# Patient Record
Sex: Male | Born: 1940 | Race: White | Hispanic: No | Marital: Married | Smoking: Never smoker
Health system: Southern US, Community
[De-identification: ages and names within clinical notes are randomized; demographics above are authoritative.]

## PROBLEM LIST (undated history)

## (undated) DIAGNOSIS — R42 Dizziness and giddiness: Secondary | ICD-10-CM

## (undated) DIAGNOSIS — G709 Myoneural disorder, unspecified: Secondary | ICD-10-CM

## (undated) DIAGNOSIS — L309 Dermatitis, unspecified: Secondary | ICD-10-CM

## (undated) DIAGNOSIS — N189 Chronic kidney disease, unspecified: Secondary | ICD-10-CM

## (undated) DIAGNOSIS — C189 Malignant neoplasm of colon, unspecified: Secondary | ICD-10-CM

## (undated) DIAGNOSIS — C801 Malignant (primary) neoplasm, unspecified: Secondary | ICD-10-CM

## (undated) DIAGNOSIS — E119 Type 2 diabetes mellitus without complications: Secondary | ICD-10-CM

## (undated) DIAGNOSIS — H269 Unspecified cataract: Secondary | ICD-10-CM

## (undated) DIAGNOSIS — E785 Hyperlipidemia, unspecified: Secondary | ICD-10-CM

## (undated) DIAGNOSIS — I10 Essential (primary) hypertension: Secondary | ICD-10-CM

## (undated) DIAGNOSIS — Z87442 Personal history of urinary calculi: Secondary | ICD-10-CM

## (undated) HISTORY — DX: Unspecified cataract: H26.9

## (undated) HISTORY — DX: Hyperlipidemia, unspecified: E78.5

## (undated) HISTORY — DX: Chronic kidney disease, unspecified: N18.9

## (undated) HISTORY — DX: Type 2 diabetes mellitus without complications: E11.9

## (undated) HISTORY — PX: COLON SURGERY: SHX602

## (undated) HISTORY — PX: CATARACT EXTRACTION: SUR2

## (undated) HISTORY — PX: CYSTOSCOPY: SUR368

## (undated) HISTORY — DX: Malignant neoplasm of colon, unspecified: C18.9

## (undated) HISTORY — DX: Essential (primary) hypertension: I10

## (undated) HISTORY — DX: Myoneural disorder, unspecified: G70.9

---

## 2009-11-19 ENCOUNTER — Encounter: Payer: Self-pay | Admitting: General Practice

## 2009-12-11 ENCOUNTER — Encounter: Payer: Self-pay | Admitting: General Practice

## 2010-12-25 ENCOUNTER — Ambulatory Visit: Payer: Self-pay | Admitting: Orthopedic Surgery

## 2011-01-08 ENCOUNTER — Encounter: Payer: Self-pay | Admitting: Orthopedic Surgery

## 2016-02-04 DIAGNOSIS — D649 Anemia, unspecified: Secondary | ICD-10-CM | POA: Insufficient documentation

## 2016-02-04 DIAGNOSIS — L309 Dermatitis, unspecified: Secondary | ICD-10-CM | POA: Insufficient documentation

## 2016-06-23 ENCOUNTER — Encounter: Payer: Self-pay | Admitting: *Deleted

## 2016-06-23 ENCOUNTER — Encounter: Payer: Medicare Other | Attending: Family Medicine | Admitting: *Deleted

## 2016-06-23 VITALS — BP 140/60 | Ht 67.0 in | Wt 182.2 lb

## 2016-06-23 DIAGNOSIS — Z6828 Body mass index (BMI) 28.0-28.9, adult: Secondary | ICD-10-CM | POA: Insufficient documentation

## 2016-06-23 DIAGNOSIS — Z713 Dietary counseling and surveillance: Secondary | ICD-10-CM | POA: Insufficient documentation

## 2016-06-23 DIAGNOSIS — E119 Type 2 diabetes mellitus without complications: Secondary | ICD-10-CM | POA: Diagnosis not present

## 2016-06-23 NOTE — Progress Notes (Signed)
Diabetes Self-Management Education  Visit Type: First/Initial  Appt. Start Time:  0845 Appt. End Time: 0945  06/23/2016  Mr. Miguel Flores, identified by name and date of birth, is a 75 y.o. male with a diagnosis of Diabetes: Type 2.   ASSESSMENT  Blood pressure 140/60, height '5\' 7"'$  (1.702 m), weight 182 lb 3.2 oz (82.6 kg). Body mass index is 28.54 kg/m.      Diabetes Self-Management Education - 06/23/16 1001      Visit Information   Visit Type First/Initial     Initial Visit   Diabetes Type Type 2   Are you currently following a meal plan? No   Are you taking your medications as prescribed? Yes   Date Diagnosed 6 years ago     Health Coping   How would you rate your overall health? Good     Psychosocial Assessment   Patient Belief/Attitude about Diabetes Motivated to manage diabetes   Self-care barriers None   Self-management support Doctor's office;Family   Patient Concerns Nutrition/Meal planning;Glycemic Control;Medication;Monitoring;Weight Control;Healthy Lifestyle   Special Needs None   Preferred Learning Style Auditory   Learning Readiness Ready   How often do you need to have someone help you when you read instructions, pamphlets, or other written materials from your doctor or pharmacy? 1 - Never   What is the last grade level you completed in school? 12th     Pre-Education Assessment   Patient understands the diabetes disease and treatment process. Needs Instruction   Patient understands incorporating nutritional management into lifestyle. Needs Instruction   Patient undertands incorporating physical activity into lifestyle. Needs Review   Patient understands using medications safely. Needs Instruction   Patient understands monitoring blood glucose, interpreting and using results Needs Review   Patient understands prevention, detection, and treatment of acute complications. Needs Instruction   Patient understands prevention, detection, and treatment of  chronic complications. Needs Review   Patient understands how to develop strategies to address psychosocial issues. Needs Instruction   Patient understands how to develop strategies to promote health/change behavior. Needs Instruction     Complications   Last HgB A1C per patient/outside source 7.9 %  05/30/16   How often do you check your blood sugar? 1-2 times/day   Fasting Blood glucose range (mg/dL) 130-179  Pt reports FBG's usually 130-150's mg/dL.   Have you had a dilated eye exam in the past 12 months? No   Have you had a dental exam in the past 12 months? Yes   Are you checking your feet? Yes   How many days per week are you checking your feet? 4     Dietary Intake   Breakfast usually eats out - sausage biscuit or pork chop biscuit; occasional cereal and milk   Lunch sandwich or occasional soup or peanut butter crackers and fruit   Dinner grilled chicken salad or fish or occasional hamburger and steak   Snack (evening) fruit   Beverage(s) water, coffee with Splenda, diet Coke     Exercise   Exercise Type Moderate (swimming / aerobic walking)   How many days per week to you exercise? 2   How many minutes per day do you exercise? 60   Total minutes per week of exercise 120     Patient Education   Previous Diabetes Education No   Disease state  Definition of diabetes, type 1 and 2, and the diagnosis of diabetes   Nutrition management  Role of diet in the treatment of diabetes  and the relationship between the three main macronutrients and blood glucose level   Physical activity and exercise  Role of exercise on diabetes management, blood pressure control and cardiac health.   Medications Reviewed patients medication for diabetes, action, purpose, timing of dose and side effects.   Monitoring Purpose and frequency of SMBG.;Identified appropriate SMBG and/or A1C goals.   Chronic complications Relationship between chronic complications and blood glucose control   Psychosocial  adjustment Identified and addressed patients feelings and concerns about diabetes     Individualized Goals (developed by patient)   Reducing Risk Improve blood sugars Decrease medications Prevent diabetes complications Lose weight Lead a healthier lifestyle     Outcomes   Expected Outcomes Demonstrated interest in learning. Expect positive outcomes      Individualized Plan for Diabetes Self-Management Training:   Learning Objective:  Patient will have a greater understanding of diabetes self-management. Patient education plan is to attend individual and/or group sessions per assessed needs and concerns.   Plan:   Patient Instructions  Check blood sugars 2 x day before breakfast every day and 2 hrs after supper 3 x week Exercise:  Continue softball  for  60-90 minutes   2 days a week Eat 3 meals day,  1-2  snacks a day Space meals 4-6 hours apart Limit foods high in fat Make an eye doctor appointment Bring blood sugar records to the next class   Expected Outcomes:  Demonstrated interest in learning. Expect positive outcomes  Education material provided:  General Meal Planning Guidelines Simple Meal Plan  If problems or questions, patient to contact team via:  Johny Drilling, [place], [place], CDE [phone]  Future DSME appointment:  Thursday July 03, 2016 for Class 1

## 2016-06-23 NOTE — Patient Instructions (Signed)
Check blood sugars 2 x day before breakfast every day and 2 hrs after supper 3 x week  Exercise:  Continue softball  for  60-90 minutes   2 days a week  Eat 3 meals day,  1-2  snacks a day Space meals 4-6 hours apart Limit foods high in fat  Make an eye doctor appointment  Bring blood sugar records to the next class  Return for classes on:

## 2016-07-03 ENCOUNTER — Encounter: Payer: Self-pay | Admitting: Dietician

## 2016-07-03 ENCOUNTER — Encounter: Payer: Medicare Other | Admitting: Dietician

## 2016-07-03 VITALS — Ht 67.0 in | Wt 182.4 lb

## 2016-07-03 DIAGNOSIS — E119 Type 2 diabetes mellitus without complications: Secondary | ICD-10-CM

## 2016-07-03 DIAGNOSIS — Z713 Dietary counseling and surveillance: Secondary | ICD-10-CM | POA: Diagnosis not present

## 2016-07-03 NOTE — Progress Notes (Signed)

## 2016-07-17 ENCOUNTER — Encounter: Payer: Medicare Other | Attending: Family Medicine | Admitting: Dietician

## 2016-07-17 ENCOUNTER — Encounter: Payer: Self-pay | Admitting: Dietician

## 2016-07-17 VITALS — BP 134/64 | Ht 67.0 in | Wt 184.4 lb

## 2016-07-17 DIAGNOSIS — E119 Type 2 diabetes mellitus without complications: Secondary | ICD-10-CM | POA: Diagnosis not present

## 2016-07-17 DIAGNOSIS — Z713 Dietary counseling and surveillance: Secondary | ICD-10-CM | POA: Diagnosis not present

## 2016-07-17 DIAGNOSIS — Z6828 Body mass index (BMI) 28.0-28.9, adult: Secondary | ICD-10-CM | POA: Insufficient documentation

## 2016-07-17 NOTE — Progress Notes (Signed)

## 2016-07-31 ENCOUNTER — Ambulatory Visit: Payer: Medicare Other

## 2016-08-21 ENCOUNTER — Encounter: Payer: Self-pay | Admitting: Dietician

## 2016-08-21 ENCOUNTER — Encounter: Payer: Medicare Other | Attending: Family Medicine | Admitting: Dietician

## 2016-08-21 VITALS — Wt 183.2 lb

## 2016-08-21 DIAGNOSIS — E119 Type 2 diabetes mellitus without complications: Secondary | ICD-10-CM | POA: Diagnosis not present

## 2016-08-21 DIAGNOSIS — Z6828 Body mass index (BMI) 28.0-28.9, adult: Secondary | ICD-10-CM | POA: Insufficient documentation

## 2016-08-21 DIAGNOSIS — Z713 Dietary counseling and surveillance: Secondary | ICD-10-CM | POA: Insufficient documentation

## 2016-09-02 ENCOUNTER — Encounter: Payer: Self-pay | Admitting: *Deleted

## 2016-10-07 DIAGNOSIS — D692 Other nonthrombocytopenic purpura: Secondary | ICD-10-CM | POA: Insufficient documentation

## 2016-11-10 DIAGNOSIS — C189 Malignant neoplasm of colon, unspecified: Secondary | ICD-10-CM

## 2016-11-10 HISTORY — DX: Malignant neoplasm of colon, unspecified: C18.9

## 2017-02-05 ENCOUNTER — Inpatient Hospital Stay: Payer: Medicare Other

## 2017-02-05 ENCOUNTER — Emergency Department: Payer: Medicare Other

## 2017-02-05 ENCOUNTER — Encounter: Payer: Self-pay | Admitting: Emergency Medicine

## 2017-02-05 ENCOUNTER — Inpatient Hospital Stay
Admission: EM | Admit: 2017-02-05 | Discharge: 2017-02-11 | DRG: 331 | Disposition: A | Discharge status: Home / Self Care | Payer: Medicare Other | Attending: Surgery | Admitting: Surgery

## 2017-02-05 DIAGNOSIS — Z7984 Long term (current) use of oral hypoglycemic drugs: Secondary | ICD-10-CM | POA: Diagnosis not present

## 2017-02-05 DIAGNOSIS — C189 Malignant neoplasm of colon, unspecified: Secondary | ICD-10-CM | POA: Diagnosis present

## 2017-02-05 DIAGNOSIS — R109 Unspecified abdominal pain: Secondary | ICD-10-CM | POA: Diagnosis not present

## 2017-02-05 DIAGNOSIS — Z79899 Other long term (current) drug therapy: Secondary | ICD-10-CM | POA: Diagnosis not present

## 2017-02-05 DIAGNOSIS — K639 Disease of intestine, unspecified: Secondary | ICD-10-CM | POA: Diagnosis not present

## 2017-02-05 DIAGNOSIS — Z85828 Personal history of other malignant neoplasm of skin: Secondary | ICD-10-CM

## 2017-02-05 DIAGNOSIS — C186 Malignant neoplasm of descending colon: Secondary | ICD-10-CM | POA: Diagnosis not present

## 2017-02-05 DIAGNOSIS — R5381 Other malaise: Secondary | ICD-10-CM | POA: Diagnosis not present

## 2017-02-05 DIAGNOSIS — D649 Anemia, unspecified: Secondary | ICD-10-CM | POA: Diagnosis present

## 2017-02-05 DIAGNOSIS — Z9889 Other specified postprocedural states: Secondary | ICD-10-CM | POA: Diagnosis not present

## 2017-02-05 DIAGNOSIS — E119 Type 2 diabetes mellitus without complications: Secondary | ICD-10-CM | POA: Diagnosis present

## 2017-02-05 DIAGNOSIS — R5383 Other fatigue: Secondary | ICD-10-CM | POA: Diagnosis not present

## 2017-02-05 DIAGNOSIS — K5900 Constipation, unspecified: Secondary | ICD-10-CM | POA: Diagnosis present

## 2017-02-05 DIAGNOSIS — E785 Hyperlipidemia, unspecified: Secondary | ICD-10-CM | POA: Diagnosis present

## 2017-02-05 DIAGNOSIS — Z800 Family history of malignant neoplasm of digestive organs: Secondary | ICD-10-CM | POA: Diagnosis not present

## 2017-02-05 DIAGNOSIS — K6389 Other specified diseases of intestine: Secondary | ICD-10-CM

## 2017-02-05 DIAGNOSIS — I10 Essential (primary) hypertension: Secondary | ICD-10-CM | POA: Diagnosis present

## 2017-02-05 DIAGNOSIS — R531 Weakness: Secondary | ICD-10-CM | POA: Diagnosis not present

## 2017-02-05 DIAGNOSIS — R110 Nausea: Secondary | ICD-10-CM | POA: Diagnosis not present

## 2017-02-05 DIAGNOSIS — R970 Elevated carcinoembryonic antigen [CEA]: Secondary | ICD-10-CM | POA: Diagnosis not present

## 2017-02-05 DIAGNOSIS — Z933 Colostomy status: Secondary | ICD-10-CM | POA: Diagnosis not present

## 2017-02-05 DIAGNOSIS — K56609 Unspecified intestinal obstruction, unspecified as to partial versus complete obstruction: Secondary | ICD-10-CM

## 2017-02-05 HISTORY — DX: Malignant (primary) neoplasm, unspecified: C80.1

## 2017-02-05 LAB — URINALYSIS, COMPLETE (UACMP) WITH MICROSCOPIC
BILIRUBIN URINE: NEGATIVE
Bacteria, UA: NONE SEEN
Glucose, UA: 150 mg/dL — AB
HGB URINE DIPSTICK: NEGATIVE
Ketones, ur: 5 mg/dL — AB
LEUKOCYTES UA: NEGATIVE
Nitrite: NEGATIVE
PH: 5 (ref 5.0–8.0)
Protein, ur: 30 mg/dL — AB
Specific Gravity, Urine: 1.027 (ref 1.005–1.030)

## 2017-02-05 LAB — COMPREHENSIVE METABOLIC PANEL
ALT: 13 U/L — ABNORMAL LOW (ref 17–63)
ANION GAP: 11 (ref 5–15)
AST: 16 U/L (ref 15–41)
Albumin: 4.4 g/dL (ref 3.5–5.0)
Alkaline Phosphatase: 66 U/L (ref 38–126)
BUN: 21 mg/dL — ABNORMAL HIGH (ref 6–20)
CALCIUM: 9.5 mg/dL (ref 8.9–10.3)
CHLORIDE: 103 mmol/L (ref 101–111)
CO2: 22 mmol/L (ref 22–32)
Creatinine, Ser: 1.04 mg/dL (ref 0.61–1.24)
GFR calc non Af Amer: >60 mL/min (ref >60)
Glucose, Bld: 230 mg/dL — ABNORMAL HIGH (ref 65–99)
Potassium: 3.8 mmol/L (ref 3.5–5.1)
SODIUM: 136 mmol/L (ref 135–145)
Total Bilirubin: 0.6 mg/dL (ref 0.3–1.2)
Total Protein: 8.1 g/dL (ref 6.5–8.1)

## 2017-02-05 LAB — LIPASE, BLOOD: LIPASE: 13 U/L (ref 11–51)

## 2017-02-05 LAB — CBC
HCT: 44.9 % (ref 40.0–52.0)
HEMOGLOBIN: 15 g/dL (ref 13.0–18.0)
MCH: 28.3 pg (ref 26.0–34.0)
MCHC: 33.3 g/dL (ref 32.0–36.0)
MCV: 84.9 fL (ref 80.0–100.0)
Platelets: 319 10*3/uL (ref 150–440)
RBC: 5.29 MIL/uL (ref 4.40–5.90)
RDW: 14.1 % (ref 11.5–14.5)
WBC: 14.3 10*3/uL — ABNORMAL HIGH (ref 3.8–10.6)

## 2017-02-05 MED ORDER — IOPAMIDOL (ISOVUE-300) INJECTION 61%
30.0000 mL | Freq: Once | INTRAVENOUS | Status: AC
  Administered 2017-02-05: 30 mL via ORAL

## 2017-02-05 MED ORDER — ONDANSETRON 4 MG PO TBDP: 4.0000 mg | ORAL_TABLET | Freq: Four times a day (QID) | ORAL | Status: DC | PRN

## 2017-02-05 MED ORDER — LORAZEPAM 2 MG/ML IJ SOLN
1.0000 mg | Freq: Once | INTRAMUSCULAR | Status: AC
  Administered 2017-02-05: 1 mg via INTRAVENOUS
  Filled 2017-02-05: qty 1

## 2017-02-05 MED ORDER — ONDANSETRON HCL 4 MG/2ML IJ SOLN
4.0000 mg | Freq: Four times a day (QID) | INTRAMUSCULAR | Status: DC | PRN
  Administered 2017-02-06: 4 mg via INTRAVENOUS
  Filled 2017-02-05: qty 2

## 2017-02-05 MED ORDER — LACTATED RINGERS IV SOLN
125.0000 mL/h | INTRAVENOUS | Status: DC
  Administered 2017-02-05 – 2017-02-09 (×8): 125 mL/h via INTRAVENOUS

## 2017-02-05 MED ORDER — HYDROMORPHONE HCL 1 MG/ML IJ SOLN
0.5000 mg | INTRAMUSCULAR | Status: DC | PRN
  Administered 2017-02-06: 0.5 mg via INTRAVENOUS
  Filled 2017-02-05: qty 0.5

## 2017-02-05 MED ORDER — PANTOPRAZOLE SODIUM 40 MG IV SOLR
40.0000 mg | Freq: Every day | INTRAVENOUS | Status: DC
  Administered 2017-02-05 – 2017-02-10 (×5): 40 mg via INTRAVENOUS
  Filled 2017-02-05 (×5): qty 40

## 2017-02-05 MED ORDER — INSULIN ASPART 100 UNIT/ML ~~LOC~~ SOLN
0.0000 [IU] | SUBCUTANEOUS | Status: DC
  Administered 2017-02-06 – 2017-02-07 (×6): 3 [IU] via SUBCUTANEOUS
  Administered 2017-02-07: 5 [IU] via SUBCUTANEOUS
  Administered 2017-02-07 (×2): 3 [IU] via SUBCUTANEOUS
  Administered 2017-02-07: 2 [IU] via SUBCUTANEOUS
  Administered 2017-02-07: 3 [IU] via SUBCUTANEOUS
  Administered 2017-02-08: 5 [IU] via SUBCUTANEOUS
  Filled 2017-02-05: qty 2
  Filled 2017-02-05 (×9): qty 3
  Filled 2017-02-05: qty 5

## 2017-02-05 MED ORDER — SORBITOL 70 % SOLN
960.0000 mL | TOPICAL_OIL | Freq: Once | ORAL | Status: DC
  Filled 2017-02-05 (×5): qty 240

## 2017-02-05 MED ORDER — KETOROLAC TROMETHAMINE 15 MG/ML IJ SOLN
15.0000 mg | Freq: Four times a day (QID) | INTRAMUSCULAR | Status: AC | PRN
  Administered 2017-02-06: 15 mg via INTRAVENOUS
  Filled 2017-02-05 (×2): qty 1

## 2017-02-05 MED ORDER — ENOXAPARIN SODIUM 40 MG/0.4ML ~~LOC~~ SOLN
40.0000 mg | SUBCUTANEOUS | Status: DC
  Administered 2017-02-05 – 2017-02-10 (×6): 40 mg via SUBCUTANEOUS
  Filled 2017-02-05 (×5): qty 0.4

## 2017-02-05 MED ORDER — IOPAMIDOL (ISOVUE-300) INJECTION 61%
100.0000 mL | Freq: Once | INTRAVENOUS | Status: AC | PRN
  Administered 2017-02-05: 100 mL via INTRAVENOUS

## 2017-02-05 MED ORDER — KETOROLAC TROMETHAMINE 30 MG/ML IJ SOLN
30.0000 mg | Freq: Once | INTRAMUSCULAR | Status: AC
  Administered 2017-02-05: 30 mg via INTRAMUSCULAR
  Filled 2017-02-05: qty 1

## 2017-02-05 MED ORDER — HYDRALAZINE HCL 20 MG/ML IJ SOLN: 10.0000 mg | Freq: Four times a day (QID) | INTRAMUSCULAR | Status: DC | PRN

## 2017-02-05 NOTE — ED Notes (Signed)
Patient states "This all started 4 months ago when I was put on iron pills. My doctor has me taking colace and maalox twice a day. I normally have a bowel movement once a day".

## 2017-02-05 NOTE — ED Triage Notes (Signed)
Pt reports no BM for one week and abd pain. Pt has also been nauseas.

## 2017-02-05 NOTE — ED Notes (Signed)
Pharmacy called and notified of need of enema. States they will send it asap.

## 2017-02-05 NOTE — ED Provider Notes (Signed)
Time Seen: Approximately *1900  I have reviewed the triage notes  Chief Complaint: Abdominal Pain and Constipation   History of Present Illness: Miguel Flores is a 76 y.o. male  who states he hasn't had a bowel movement in approximately a week. He states he still is passing some gas. He states he started some iron tablets approximately 4 months ago and has seen his primary doctor recently for constipation. He's had no surgeries on his abdomen. He denies any difficulty with back pain, dysuria, hematuria. He states today was the first day that he became nauseated and had difficulty keeping food or fluids down and states his abdomen feels more distended.   Past Medical History:  Diagnosis Date  . Diabetes mellitus without complication ([place])   . Hyperlipidemia   . Hypertension     There are no active problems to display for this patient.   History reviewed. No pertinent surgical history.  History reviewed. No pertinent surgical history.  Current Outpatient Rx  . Order #: [id number] Class: Historical Med  . Order #: [id number] Class: Historical Med  . Order #: [id number] Class: Historical Med  . Order #: [id number] Class: Historical Med  . Order #: [id number] Class: Historical Med  . Order #: [id number] Class: Historical Med  . Order #: [id number] Class: Historical Med  . Order #: [id number] Class: Historical Med  . Order #: [id number] Class: Historical Med  . Order #: [id number] Class: Historical Med    Allergies:  Patient has no known allergies.  Family History: Family History  Problem Relation Age of Onset  . Diabetes Mother   . Diabetes Father     Social History: Social History  Substance Use Topics  . Smoking status: Never Smoker  . Smokeless tobacco: Never Used  . Alcohol use No     Review of Systems:   10 point review of systems was performed and was otherwise negative:  Constitutional: No fever Eyes: No visual disturbances ENT: No sore throat, ear pain Cardiac: No  chest pain Respiratory: No shortness of breath, wheezing, or stridor Abdomen: Diffuse crampy abdominal pain with distention. No previous loose stool. Recent onset of vomiting (no fecal material) Endocrine: No weight loss, No night sweats Extremities: No peripheral edema, cyanosis Skin: No rashes, easy bruising Neurologic: No focal weakness, trouble with speech or swollowing Urologic: No dysuria, Hematuria, or urinary frequency   Physical Exam:  ED Triage Vitals  Enc Vitals Group     BP 02/05/17 1743 (!) 146/66     Pulse Rate 02/05/17 1743 89     Resp 02/05/17 1743 18     Temp 02/05/17 1743 98.7 F (37.1 C)     Temp Source 02/05/17 1743 Oral     SpO2 02/05/17 1743 95 %     Weight 02/05/17 1744 191 lb (86.6 kg)     Height 02/05/17 1744 '5\' 5"'$  (1.651 m)     Head Circumference --      Peak Flow --      Pain Score 02/05/17 1754 5     Pain Loc --      Pain Edu? --      Excl. in [place]? --     General: Awake , Alert , and Oriented times 3; GCS 15 Head: Normal cephalic , atraumatic Eyes: Pupils equal , round, reactive to light Nose/Throat: No nasal drainage, patent upper airway without erythema or exudate.  Neck: Supple, Full range of motion, No anterior adenopathy or palpable thyroid masses Lungs: Clear to ascultation without wheezes , rhonchi, or rales  Heart: Regular rate, regular rhythm without murmurs , gallops , or rubs Abdomen: Abdomen is distended, no peritoneal signs. No fluid wave. He does have some tympanic bowel sounds in the right upper quadrant with no focal tenderness or palpable masses noted      Extremities: 2 plus symmetric pulses. No edema, clubbing or cyanosis Neurologic: normal ambulation, Motor symmetric without deficits, sensory intact Skin: warm, dry, no rashes   Labs:   All laboratory work was reviewed including any pertinent negatives or positives listed below:  Labs Reviewed  COMPREHENSIVE METABOLIC PANEL - Abnormal; Notable for the following:        Result Value   Glucose, Bld 230 (*)    BUN 21 (*)    ALT 13 (*)    All other components within normal limits  CBC - Abnormal; Notable for the following:    WBC 14.3 (*)    All other components within normal limits  LIPASE, BLOOD  URINALYSIS, COMPLETE (UACMP) WITH MICROSCOPIC   Radiology:  "Dg Abd Acute W/chest  Result Date: 02/05/2017 CLINICAL DATA:  Constipation x1 week with urinary frequency x2 days. History of renal stones. Nonsmoker. EXAM: DG ABDOMEN ACUTE W/ 1V CHEST COMPARISON:  None. FINDINGS: Dilated small bowel loops with air-fluid levels consistent with small bowel obstruction. Heart is top-normal in size. There is aortic atherosclerosis. There is eventration of right hemidiaphragm. Minimal atelectasis the left lung base. There is no free air. Calcifications in the pelvis bilaterally consistent with vascular phleboliths. IMPRESSION: Abnormal bowel gas pattern with air-fluid levels and small bowel dilatation consistent with small-bowel obstruction. Left basilar atelectasis. Electronically Signed   By: Ashley Royalty M.D.   On: 02/05/2017 19:55  "   I personally reviewed the radiologic studies CAT scan of the abdomen is pending  Procedures: ** Nurses to establish a nasogastric tube  ED Course: ** Patient appears to have a small bowel obstruction. Of concern is the patient's had no previous abdominal surgery. Brief discussion with surgery on call was recommended to have an abdominal CT to see if there is a transition point, mass, etc.     Assessment: * Small bowel obstruction     Plan:  Surgical assessment*            Daymon Larsen, MD 02/05/17 2033

## 2017-02-06 ENCOUNTER — Inpatient Hospital Stay: Payer: Medicare Other | Admitting: Certified Registered"

## 2017-02-06 ENCOUNTER — Encounter: Payer: Self-pay | Admitting: Anesthesiology

## 2017-02-06 ENCOUNTER — Inpatient Hospital Stay: Payer: Medicare Other | Admitting: Anesthesiology

## 2017-02-06 ENCOUNTER — Encounter
Admission: EM | Disposition: A | Discharge status: Home / Self Care | Payer: Self-pay | Source: Home / Self Care | Attending: Surgery

## 2017-02-06 DIAGNOSIS — C186 Malignant neoplasm of descending colon: Secondary | ICD-10-CM

## 2017-02-06 DIAGNOSIS — K6389 Other specified diseases of intestine: Secondary | ICD-10-CM

## 2017-02-06 HISTORY — PX: COLECTOMY WITH COLOSTOMY CREATION/HARTMANN PROCEDURE: SHX6598

## 2017-02-06 HISTORY — PX: FLEXIBLE SIGMOIDOSCOPY: SHX5431

## 2017-02-06 LAB — CBC WITH DIFFERENTIAL/PLATELET
BASOS ABS: 0 10*3/uL (ref 0–0.1)
BASOS PCT: 0 %
EOS ABS: 0 10*3/uL (ref 0–0.7)
Eosinophils Relative: 0 %
HEMATOCRIT: 41.5 % (ref 40.0–52.0)
HEMOGLOBIN: 14 g/dL (ref 13.0–18.0)
Lymphocytes Relative: 12 %
Lymphs Abs: 1.6 10*3/uL (ref 1.0–3.6)
MCH: 28.7 pg (ref 26.0–34.0)
MCHC: 33.8 g/dL (ref 32.0–36.0)
MCV: 85 fL (ref 80.0–100.0)
Monocytes Absolute: 1.1 10*3/uL — ABNORMAL HIGH (ref 0.2–1.0)
Monocytes Relative: 8 %
NEUTROS ABS: 10.4 10*3/uL — AB (ref 1.4–6.5)
NEUTROS PCT: 80 %
Platelets: 286 10*3/uL (ref 150–440)
RBC: 4.89 MIL/uL (ref 4.40–5.90)
RDW: 14 % (ref 11.5–14.5)
WBC: 13.1 10*3/uL — AB (ref 3.8–10.6)

## 2017-02-06 LAB — BASIC METABOLIC PANEL
Anion gap: 8 (ref 5–15)
BUN: 27 mg/dL — ABNORMAL HIGH (ref 6–20)
CALCIUM: 8.8 mg/dL — AB (ref 8.9–10.3)
CO2: 22 mmol/L (ref 22–32)
Chloride: 106 mmol/L (ref 101–111)
Creatinine, Ser: 1.2 mg/dL (ref 0.61–1.24)
GFR, EST NON AFRICAN AMERICAN: 57 mL/min — AB (ref >60)
GLUCOSE: 171 mg/dL — AB (ref 65–99)
POTASSIUM: 3.5 mmol/L (ref 3.5–5.1)
Sodium: 136 mmol/L (ref 135–145)

## 2017-02-06 LAB — GLUCOSE, CAPILLARY
GLUCOSE-CAPILLARY: 163 mg/dL — AB (ref 65–99)
GLUCOSE-CAPILLARY: 174 mg/dL — AB (ref 65–99)
GLUCOSE-CAPILLARY: 186 mg/dL — AB (ref 65–99)
Glucose-Capillary: 157 mg/dL — ABNORMAL HIGH (ref 65–99)
Glucose-Capillary: 184 mg/dL — ABNORMAL HIGH (ref 65–99)
Glucose-Capillary: 148 mg/dL — ABNORMAL HIGH (ref 65–99)
Glucose-Capillary: 161 mg/dL — ABNORMAL HIGH (ref 65–99)
Glucose-Capillary: 159 mg/dL — ABNORMAL HIGH (ref 65–99)

## 2017-02-06 LAB — MAGNESIUM: MAGNESIUM: 2.3 mg/dL (ref 1.7–2.4)

## 2017-02-06 SURGERY — COLECTOMY, WITH COLOSTOMY CREATION
Anesthesia: General | Site: Abdomen | Wound class: Clean Contaminated

## 2017-02-06 SURGERY — SIGMOIDOSCOPY, FLEXIBLE
Anesthesia: Monitor Anesthesia Care

## 2017-02-06 MED ORDER — SUGAMMADEX SODIUM 200 MG/2ML IV SOLN
INTRAVENOUS | Status: AC
  Filled 2017-02-06: qty 2

## 2017-02-06 MED ORDER — PROPOFOL 10 MG/ML IV BOLUS
INTRAVENOUS | Status: AC
  Filled 2017-02-06: qty 20

## 2017-02-06 MED ORDER — LACTATED RINGERS IV SOLN
INTRAVENOUS | Status: DC | PRN
  Administered 2017-02-06: 21:00:00 via INTRAVENOUS

## 2017-02-06 MED ORDER — HYDROMORPHONE 1 MG/ML IV SOLN
INTRAVENOUS | Status: DC
  Administered 2017-02-06: 0.5 mg via INTRAVENOUS
  Administered 2017-02-07: 0.6 mg via INTRAVENOUS
  Administered 2017-02-07: 08:00:00 via INTRAVENOUS
  Administered 2017-02-08: 1.5 mg via INTRAVENOUS
  Administered 2017-02-08: 1.2 mg via INTRAVENOUS
  Administered 2017-02-08 – 2017-02-09 (×2): 1.5 mg via INTRAVENOUS
  Filled 2017-02-06 (×2): qty 25

## 2017-02-06 MED ORDER — MIDAZOLAM HCL 2 MG/2ML IJ SOLN
INTRAMUSCULAR | Status: AC
  Filled 2017-02-06: qty 2

## 2017-02-06 MED ORDER — SUCCINYLCHOLINE CHLORIDE 20 MG/ML IJ SOLN
INTRAMUSCULAR | Status: DC | PRN
Start: 2017-02-06 — End: 2017-02-06
  Administered 2017-02-06: 100 mg via INTRAVENOUS

## 2017-02-06 MED ORDER — ONDANSETRON HCL 4 MG/2ML IJ SOLN
INTRAMUSCULAR | Status: DC | PRN
  Administered 2017-02-06: 4 mg via INTRAVENOUS

## 2017-02-06 MED ORDER — ACETAMINOPHEN 10 MG/ML IV SOLN
INTRAVENOUS | Status: DC | PRN
  Administered 2017-02-06: 1000 mg via INTRAVENOUS

## 2017-02-06 MED ORDER — DIPHENHYDRAMINE HCL 50 MG/ML IJ SOLN: 12.5000 mg | Freq: Four times a day (QID) | INTRAMUSCULAR | Status: DC | PRN

## 2017-02-06 MED ORDER — FENTANYL CITRATE (PF) 100 MCG/2ML IJ SOLN: 25.0000 ug | INTRAMUSCULAR | Status: DC | PRN

## 2017-02-06 MED ORDER — FENTANYL CITRATE (PF) 250 MCG/5ML IJ SOLN
INTRAMUSCULAR | Status: AC
  Filled 2017-02-06: qty 5

## 2017-02-06 MED ORDER — ONDANSETRON HCL 4 MG/2ML IJ SOLN: 4.0000 mg | Freq: Once | INTRAMUSCULAR | Status: DC | PRN

## 2017-02-06 MED ORDER — FENTANYL CITRATE (PF) 100 MCG/2ML IJ SOLN
INTRAMUSCULAR | Status: DC | PRN
  Administered 2017-02-06 (×2): 50 ug via INTRAVENOUS

## 2017-02-06 MED ORDER — SUCCINYLCHOLINE CHLORIDE 20 MG/ML IJ SOLN
INTRAMUSCULAR | Status: AC
  Filled 2017-02-06: qty 1

## 2017-02-06 MED ORDER — DIPHENHYDRAMINE HCL 12.5 MG/5ML PO ELIX: 12.5000 mg | ORAL_SOLUTION | Freq: Four times a day (QID) | ORAL | Status: DC | PRN

## 2017-02-06 MED ORDER — BUPIVACAINE-EPINEPHRINE (PF) 0.25% -1:200000 IJ SOLN
INTRAMUSCULAR | Status: AC
  Filled 2017-02-06: qty 30

## 2017-02-06 MED ORDER — SODIUM CHLORIDE 0.9 % IV SOLN
INTRAVENOUS | Status: DC
  Administered 2017-02-06: 1000 mL via INTRAVENOUS

## 2017-02-06 MED ORDER — MIDAZOLAM HCL 2 MG/2ML IJ SOLN
INTRAMUSCULAR | Status: DC | PRN
Start: 2017-02-06 — End: 2017-02-06
  Administered 2017-02-06: 2 mg via INTRAVENOUS

## 2017-02-06 MED ORDER — SODIUM CHLORIDE 0.9% FLUSH: 9.0000 mL | INTRAVENOUS | Status: DC | PRN

## 2017-02-06 MED ORDER — MIDAZOLAM HCL 2 MG/2ML IJ SOLN
INTRAMUSCULAR | Status: DC | PRN
  Administered 2017-02-06: 2 mg via INTRAVENOUS

## 2017-02-06 MED ORDER — BUPIVACAINE-EPINEPHRINE (PF) 0.25% -1:200000 IJ SOLN
INTRAMUSCULAR | Status: DC | PRN
  Administered 2017-02-06: 30 mL

## 2017-02-06 MED ORDER — ONDANSETRON HCL 4 MG/2ML IJ SOLN
INTRAMUSCULAR | Status: AC
  Filled 2017-02-06: qty 2

## 2017-02-06 MED ORDER — SODIUM CHLORIDE 0.9 % IV SOLN
1.0000 g | INTRAVENOUS | Status: AC
  Administered 2017-02-06: 1 g via INTRAVENOUS
  Filled 2017-02-06: qty 1

## 2017-02-06 MED ORDER — FENTANYL CITRATE (PF) 100 MCG/2ML IJ SOLN
INTRAMUSCULAR | Status: AC
  Filled 2017-02-06: qty 2

## 2017-02-06 MED ORDER — NALOXONE HCL 0.4 MG/ML IJ SOLN: 0.4000 mg | INTRAMUSCULAR | Status: DC | PRN

## 2017-02-06 MED ORDER — SUGAMMADEX SODIUM 200 MG/2ML IV SOLN
INTRAVENOUS | Status: DC | PRN
  Administered 2017-02-06: 200 mg via INTRAVENOUS

## 2017-02-06 MED ORDER — ONDANSETRON HCL 4 MG/2ML IJ SOLN: 4.0000 mg | Freq: Four times a day (QID) | INTRAMUSCULAR | Status: DC | PRN

## 2017-02-06 MED ORDER — FLEET ENEMA 7-19 GM/118ML RE ENEM
2.0000 | ENEMA | Freq: Once | RECTAL | Status: AC
Start: 2017-02-06 — End: 2017-02-06
  Administered 2017-02-06: 2 via RECTAL

## 2017-02-06 MED ORDER — PROPOFOL 10 MG/ML IV BOLUS
INTRAVENOUS | Status: DC | PRN
  Administered 2017-02-06: 140 mg via INTRAVENOUS

## 2017-02-06 MED ORDER — FENTANYL CITRATE (PF) 100 MCG/2ML IJ SOLN
INTRAMUSCULAR | Status: DC | PRN
  Administered 2017-02-06: 50 ug via INTRAVENOUS
  Administered 2017-02-06: 150 ug via INTRAVENOUS
  Administered 2017-02-06: 50 ug via INTRAVENOUS

## 2017-02-06 MED ORDER — ROCURONIUM BROMIDE 50 MG/5ML IV SOLN
INTRAVENOUS | Status: AC
  Filled 2017-02-06: qty 1

## 2017-02-06 MED ORDER — MENTHOL 3 MG MT LOZG
1.0000 | LOZENGE | OROMUCOSAL | Status: DC | PRN
  Filled 2017-02-06: qty 9

## 2017-02-06 MED ORDER — ROCURONIUM BROMIDE 100 MG/10ML IV SOLN
INTRAVENOUS | Status: DC | PRN
  Administered 2017-02-06: 40 mg via INTRAVENOUS

## 2017-02-06 MED ORDER — LABETALOL HCL 5 MG/ML IV SOLN
INTRAVENOUS | Status: DC | PRN
  Administered 2017-02-06: 10 mg via INTRAVENOUS

## 2017-02-06 MED ORDER — PHENYLEPHRINE HCL 10 MG/ML IJ SOLN
INTRAMUSCULAR | Status: AC
  Filled 2017-02-06: qty 1

## 2017-02-06 MED ORDER — ACETAMINOPHEN 10 MG/ML IV SOLN
INTRAVENOUS | Status: AC
  Filled 2017-02-06: qty 100

## 2017-02-06 SURGICAL SUPPLY — 53 items
ADHESIVE MASTISOL STRL (MISCELLANEOUS) IMPLANT
BARRIER SKIN 2 1/4 (WOUND CARE) ×2 IMPLANT
BARRIER SKIN 2 1/4INCH (WOUND CARE) ×1
CANISTER SUCT 1200ML W/VALVE (MISCELLANEOUS) ×3 IMPLANT
CATH TRAY 16F METER LATEX (MISCELLANEOUS) ×3 IMPLANT
CHLORAPREP W/TINT 26ML (MISCELLANEOUS) ×3 IMPLANT
CLOSURE WOUND 1/2 X4 (GAUZE/BANDAGES/DRESSINGS)
COVER CLAMP SIL LG PBX B (MISCELLANEOUS) ×3 IMPLANT
DRAPE LAPAROTOMY 100X77 ABD (DRAPES) ×3 IMPLANT
DRAPE LEGGINS SURG 28X43 STRL (DRAPES) IMPLANT
DRSG OPSITE POSTOP 4X10 (GAUZE/BANDAGES/DRESSINGS) ×3 IMPLANT
DRSG OPSITE POSTOP 4X8 (GAUZE/BANDAGES/DRESSINGS) IMPLANT
DRSG TELFA 3X8 NADH (GAUZE/BANDAGES/DRESSINGS) IMPLANT
ELECT CAUTERY BLADE 6.4 (BLADE) ×3 IMPLANT
ELECT REM PT RETURN 9FT ADLT (ELECTROSURGICAL) ×3
ELECTRODE REM PT RTRN 9FT ADLT (ELECTROSURGICAL) ×1 IMPLANT
GAUZE SPONGE 4X4 12PLY STRL (GAUZE/BANDAGES/DRESSINGS) IMPLANT
GLOVE BIO SURGEON STRL SZ8 (GLOVE) ×9 IMPLANT
GLOVE INDICATOR 8.0 STRL GRN (GLOVE) ×9 IMPLANT
GOWN STRL REUS W/ TWL LRG LVL3 (GOWN DISPOSABLE) ×3 IMPLANT
GOWN STRL REUS W/ TWL XL LVL3 (GOWN DISPOSABLE) IMPLANT
GOWN STRL REUS W/TWL LRG LVL3 (GOWN DISPOSABLE) ×6
GOWN STRL REUS W/TWL XL LVL3 (GOWN DISPOSABLE)
KIT RM TURNOVER STRD PROC AR (KITS) ×3 IMPLANT
LABEL OR SOLS (LABEL) ×3 IMPLANT
NDL SAFETY 22GX1.5 (NEEDLE) ×6 IMPLANT
NS IRRIG 1000ML POUR BTL (IV SOLUTION) ×3 IMPLANT
PACK BASIN MAJOR ARMC (MISCELLANEOUS) ×3 IMPLANT
PACK COLON CLEAN CLOSURE (MISCELLANEOUS) IMPLANT
POUCH DRAIN  2 1/4 MED RED 181 (OSTOMY) ×3 IMPLANT
RELOAD PROXIMATE 30MM BLUE (ENDOMECHANICALS) IMPLANT
RELOAD PROXIMATE 75MM BLUE (ENDOMECHANICALS) ×3 IMPLANT
RELOAD STAPLER LINEAR PROX 30 (STAPLE) IMPLANT
SEPRAFILM MEMBRANE 5X6 (MISCELLANEOUS) IMPLANT
SET YANKAUER POOLE SUCT (MISCELLANEOUS) ×3 IMPLANT
SPONGE LAP 18X18 5 PK (GAUZE/BANDAGES/DRESSINGS) ×3 IMPLANT
STAPLER PROXIMATE 75MM BLUE (STAPLE) ×3 IMPLANT
STAPLER RELOAD LINEAR PROX 30 (STAPLE)
STAPLER SKIN PROX 35W (STAPLE) ×3 IMPLANT
STRIP CLOSURE SKIN 1/2X4 (GAUZE/BANDAGES/DRESSINGS) IMPLANT
SUT MNCRL 3-0 UNDYED SH (SUTURE) IMPLANT
SUT MONOCRYL 3-0 UNDYED (SUTURE)
SUT PDS AB 1 CT1 27 (SUTURE) IMPLANT
SUT PDS AB 1 TP1 54 (SUTURE) ×6 IMPLANT
SUT PROLENE 3 0 SH DA (SUTURE) ×3 IMPLANT
SUT SILK 0 (SUTURE) ×4
SUT SILK 0 30XBRD TIE 6 (SUTURE) ×2 IMPLANT
SUT SILK 3-0 (SUTURE) ×3 IMPLANT
SUT VIC AB 1 CTX 27 (SUTURE) IMPLANT
SUT VIC AB 3-0 SH 27 (SUTURE) ×6
SUT VIC AB 3-0 SH 27X BRD (SUTURE) ×3 IMPLANT
SUT VICRYL 0 TIES 12 18 (SUTURE) IMPLANT
SYRINGE 10CC LL (SYRINGE) ×6 IMPLANT

## 2017-02-06 NOTE — Anesthesia Preprocedure Evaluation (Signed)
Anesthesia Evaluation  Patient identified by MRN, date of birth, ID band Patient awake    Reviewed: Allergy & Precautions, H&P , NPO status , Patient's Chart, lab work & pertinent test results, reviewed documented beta blocker date and time   Airway Mallampati: II  TM Distance: >3 FB Neck ROM: full    Dental  (+) Teeth Intact, Poor Dentition,    Pulmonary neg pulmonary ROS,    Pulmonary exam normal        Cardiovascular hypertension, negative cardio ROS Normal cardiovascular exam Rhythm:regular Rate:Normal     Neuro/Psych negative neurological ROS  negative psych ROS   GI/Hepatic negative GI ROS, Neg liver ROS,   Endo/Other  negative endocrine ROSdiabetes  Renal/GU negative Renal ROS  negative genitourinary   Musculoskeletal   Abdominal   Peds  Hematology negative hematology ROS (+)   Anesthesia Other Findings Past Medical History: No date: Cancer ([place]) No date: Diabetes mellitus without complication ([place]) No date: Hyperlipidemia No date: Hypertension Past Surgical History: No date: CYSTOSCOPY BMI    Body Mass Index:  30.37 kg/m     Reproductive/Obstetrics negative OB ROS                             Anesthesia Physical Anesthesia Plan  ASA: II and emergent  Anesthesia Plan: General ETT   Post-op Pain Management:    Induction: Rapid sequence, Intravenous and Cricoid pressure planned  Airway Management Planned: Video Laryngoscope Planned  Additional Equipment:   Intra-op Plan:   Post-operative Plan:   Informed Consent: I have reviewed the patients History and Physical, chart, labs and discussed the procedure including the risks, benefits and alternatives for the proposed anesthesia with the patient or authorized representative who has indicated his/her understanding and acceptance.   Dental Advisory Given  Plan Discussed with: CRNA  Anesthesia Plan Comments:  (Possible post op ventilation secondary to obstruction and body habitus.  Discussed with family. JA)        Anesthesia Quick Evaluation

## 2017-02-06 NOTE — H&P (View-Only) (Signed)
Patient ID: Miguel Flores, male   DOB: 1941-03-26, 76 y.o.   MRN: [id number] Pt admitted last night with 1 week history of constipation and abdominal distension. CT suggests obstruction in proximal sigmoid with apple core lesion.  Pt is awake, not voicing any new complaints. AVSS. Abdomen is distended, scant tenderness, hypoactive bowel sounds. Labs- K 3.5, Creat 1.2, WBC 13K Discussed findings with pt.  GI consult requested by Dr. Hampton Abbot. CEA ordered CT scan reviewed. Feel pt  Will need laparotomy, colon resection with Hartman's  He does appear mildly dehydrated. Will hydrate and consider surgery tomorrow.

## 2017-02-06 NOTE — Progress Notes (Signed)
Patient post flex sigmoidoscopy. Awake, alert, oriented and report called to [place].

## 2017-02-06 NOTE — Anesthesia Procedure Notes (Signed)
Performed by: COOK-MARTIN, Maki Sweetser Pre-anesthesia Checklist: Patient identified, Emergency Drugs available, Suction available, Patient being monitored and Timeout performed Patient Re-evaluated:Patient Re-evaluated prior to inductionOxygen Delivery Method: Simple face mask Preoxygenation: Pre-oxygenation with 100% oxygen Intubation Type: IV induction Placement Confirmation: positive ETCO2 and CO2 detector       

## 2017-02-06 NOTE — Interval H&P Note (Signed)
History and Physical Interval Note:  02/06/2017 2:07 PM  Miguel Flores  has presented today for surgery, with the diagnosis of Colonic obstruction  The various methods of treatment have been discussed with the patient and family. After consideration of risks, benefits and other options for treatment, the patient has consented to  Procedure(s): FLEXIBLE SIGMOIDOSCOPY (N/A) as a surgical intervention .  The patient's history has been reviewed, patient examined, no change in status, stable for surgery.  I have reviewed the patient's chart and labs.  Questions were answered to the patient's satisfaction.     Masud Holub G

## 2017-02-06 NOTE — Transfer of Care (Signed)
Immediate Anesthesia Transfer of Care Note  Patient: Miguel Flores  Procedure(s) Performed: Procedure(s): COLECTOMY WITH COLOSTOMY CREATION/HARTMANN PROCEDURE (N/A)  Patient Location: PACU  Anesthesia Type:General  Level of Consciousness: sedated and responds to stimulation  Airway & Oxygen Therapy: Patient Spontanous Breathing and Patient connected to face mask oxygen  Post-op Assessment: Report given to RN and Post -op Vital signs reviewed and stable  Post vital signs: Reviewed and stable  Last Vitals:  Vitals:   02/06/17 2151 02/06/17 2152  BP: (!) 157/75 (!) 157/75  Pulse: 77 76  Resp: (!) 27 (!) 27  Temp: 36.1 C     Last Pain:  Vitals:   02/06/17 1740  TempSrc:   PainSc: 3          Complications: No apparent anesthesia complications

## 2017-02-06 NOTE — Anesthesia Preprocedure Evaluation (Signed)
Anesthesia Evaluation  Patient identified by MRN, date of birth, ID band Patient awake    Reviewed: Allergy & Precautions, NPO status , Patient's Chart, lab work & pertinent test results, reviewed documented beta blocker date and time   Airway Mallampati: III  TM Distance: >3 FB     Dental  (+) Chipped   Pulmonary           Cardiovascular hypertension, Pt. on medications      Neuro/Psych    GI/Hepatic   Endo/Other  diabetes, Type 2  Renal/GU      Musculoskeletal   Abdominal   Peds  Hematology   Anesthesia Other Findings Colon obstruction.  Reproductive/Obstetrics                             Anesthesia Physical Anesthesia Plan  ASA: IV  Anesthesia Plan: MAC   Post-op Pain Management:    Induction:   Airway Management Planned:   Additional Equipment:   Intra-op Plan:   Post-operative Plan:   Informed Consent: I have reviewed the patients History and Physical, chart, labs and discussed the procedure including the risks, benefits and alternatives for the proposed anesthesia with the patient or authorized representative who has indicated his/her understanding and acceptance.     Plan Discussed with: CRNA  Anesthesia Plan Comments:         Anesthesia Quick Evaluation

## 2017-02-06 NOTE — Progress Notes (Signed)
Orderly here to take patient to flexible sigmoidoscopy

## 2017-02-06 NOTE — Progress Notes (Signed)
Obstructing lesion of 45 cm. This was discussed with Dr. Jamal Collin Dr. Jamal Collin and I discussed timing of the inevitable operation. A Hartman's procedure was decided upon. Patient is interviewed and noted that he has a high NG output but no tenderness. He is awake alert and oriented  Vital signs reviewed Abdomen is markedly distended tympanitic but nontender there are no scars noted Calves are nontender  Labs are reviewed  CT scan is personally reviewed suggesting an obstructing lesion verified on sigmoidoscopy.  I discussed with the patient and his son and son-in-law the findings both on CT and on sigmoidoscopy. They're aware that the patient will require an operation. The operation would not change whether was done tonight tomorrow or Sunday in that he will need a diverting colostomy and resection of this apple core lesion. The rationale for waiting until tomorrow was discussed versus doing this tonight. I feel that the risks only increase with time in that he has a completely obstructing lesion at this point and his cecum on CT scan yesterday was markedly dilated. I do not believe that waiting until tomorrow is in this patient's best interest and I will taken to the operating room to perform a Hartman's procedure tonight. I discussed with he and his sons the rationale and the options and the risks of bleeding infection recurrence and the absolute need for a colostomy and that it could be and likely will be temporary that the remainder of the colon would be assessed at a later date as would any sign of metastatic lesions identified on CT scan (lumbar spine lung and liver). Multiple questions were answered for them they understood and agreed to proceed. This was also discussed with Dr. Jamal Collin.

## 2017-02-06 NOTE — Anesthesia Post-op Follow-up Note (Cosign Needed)
Anesthesia QCDR form completed.        

## 2017-02-06 NOTE — Op Note (Signed)
   Pre-operative Diagnosis: Obstructing sigmoid colon mass  Post-operative Diagnosis: Same  Surgeon: Phoebe Perch   Assistants: Dr. Jamal Collin  Anesthesia: Gen. with endotracheal tube   Procedure Details  The patient was seen again in the Holding Room. The benefits, complications, treatment options, and expected outcomes were discussed with the patient. The risks of bleeding, infection, recurrence of symptoms, failure to resolve symptoms,  bowel injury, any of which could require further surgery were reviewed with the patient. The plan for a Hartman's procedure with end colostomy was reviewed.  The patient was taken to Operating Room, identified as Miguel Flores and the procedure verified.  A Time Out was held and the above information confirmed.  Prior to the induction of general anesthesia, antibiotic prophylaxis was administered. VTE prophylaxis was in place. General endotracheal anesthesia was then administered and tolerated well. After the induction, the abdomen was prepped with Chloraprep and draped in the sterile fashion. The patient was positioned in the supine position. A Foley catheter had been placed and a nasogastric tube was in position  Once the surgical pause was performed and the patient had been prepped and draped in a sterile fashion a midline incision was utilized to open and explore the greatly protuberant and distended abdomen. Small bowel loops were easily identifiable the nasogastric tube was confirmed to be in the proper position in the stomach. There was no sign of diaphragmatic or liver masses.  A large mass in the descending colon and sigmoid colon area was identified and found to be greatly constricting. The proximal colon was greatly distended as was the cecum but the cecum appeared viable without signs of ischemia and no sign of impending perforation.  Section was performed by placing GIA staplers on either side of the constricting mass. The mesentery was divided  between clamps and ligated with 0 silk ligatures or 3-0 suture ligatures. Once assuring that hemostasis was adequate at site was chosen for ostomy. An ostomy site was prepared in the usual fashion. The colon was passed out through the abdominal wall and found to be under no tension and viable.  Attention was returned to the abdomen and once assuring that hemostasis was adequate and the sponge lap needle count was correct the wound was closed with running #1 PDS and staples were placed after placing 30 cc of Marcaine into the subcutaneous tissues. A blue towel was placed over the incision.  The ostomy was then matured in a standard fashion utilizing 3-0 Vicryl interrupted sutures. The ostomy was digitalized and gas escaped. A Poole suction device was placed into the colostomy carefully and gas and liquid stool was evacuated. An ostomy device was placed over the matured colostomy.  Dressing was placed over the midline wound.  Patient out of the procedure well the workup occasions he was taken to recovery room in stable condition to be admitted for continued care sponge lap needle count was correct yes metabolic loss was 50 cc.  Findings: As above   Estimated Blood Loss: 30 cc         Drains: None         Specimens: Sigmoid colon mass          Complications: None                  Condition: Stable   Miguel Kasler E. Burt Knack, MD, FACS

## 2017-02-06 NOTE — Interval H&P Note (Signed)
History and Physical Interval Note:  02/06/2017 2:07 PM  Miguel Flores  has presented today for surgery, with the diagnosis of Colonic obstruction  The various methods of treatment have been discussed with the patient and family. After consideration of risks, benefits and other options for treatment, the patient has consented to  Procedure(s): FLEXIBLE SIGMOIDOSCOPY (N/A) as a surgical intervention .  The patient's history has been reviewed, patient examined, no change in status, stable for surgery.  I have reviewed the patient's chart and labs.  Questions were answered to the patient's satisfaction.     SANKAR,SEEPLAPUTHUR G

## 2017-02-06 NOTE — H&P (Signed)
Date of Admission:  02/06/2017  Reason for Admission:  Large bowel obstruction  History of Present Illness: Miguel Flores is a 76 y.o. male who presents with a one-week history of no bowel movement with progressive abdominal distention.  The patient has been on iron supplementation over the past 4 months secondary to anemia and has had issues with constipation from it.  This week, he has not had a bowel movement and has been increasing and trying different laxatives with no improvement.  He has been able to have flatus, though reports less frequently than before.  Yesterday he started having nausea and had episodes of emesis.  As he has become more distended, he has also been having somewhat diffuse crampy abdominal pain.  He presented to the ED today for further evaluation after calling his PCP's office.  Workup initially revealed possible small bowel obstruction on KUB, but CT scan done is showing an apple core lesion in the descending / sigmoid colon junction.  He denies any fevers, chills, chest pain, shortness of breath, blood in the stool, dysuria, hematuria, weight loss.  He has never had a colonoscopy.  Past Medical History: Past Medical History:  Diagnosis Date  . 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 Diabetes mellitus without complication ([place])   . Hyperlipidemia   . Hypertension      Past Surgical History: --Excision of basal cell carcinoma of nose, with flap.  Home Medications: Prior to Admission medications   Medication Sig Start Date End Date Taking? Authorizing Provider  Docusate Sodium (DOCU PO) Take 1 capsule by mouth 2 (two) times daily.   Yes Historical Provider, MD  ferrous sulfate 324 (65 Fe) MG TBEC Take 324 mg by mouth daily. 02/03/17  Yes Historical Provider, MD  glimepiride (AMARYL) 2 MG tablet Take 2 mg by mouth daily. 08/14/15  Yes Historical Provider, MD  Glucosamine-Chondroitin 500-400 MG CAPS Take 1 tablet by mouth 2 (two) times daily.   Yes Historical  Provider, MD  hydrocortisone (RECTACORT-HC) 25 MG suppository Place 1 suppository rectally 2 (two) times daily as needed. 01/24/16  Yes Historical Provider, MD  loratadine (CLARITIN) 10 MG tablet Take 10 mg by mouth daily.   Yes Historical Provider, MD  losartan (COZAAR) 25 MG tablet Take 25 mg by mouth daily. 03/27/16  Yes Historical Provider, MD  metFORMIN (GLUCOPHAGE) 1000 MG tablet Take 1,000-1,500 tablets by mouth 2 (two) times daily. Take 1 tablet in AM and 1 1/2 tablets at supper 08/07/16  Yes Historical Provider, MD  polyethylene glycol powder (GLYCOLAX/MIRALAX) powder Take 17 g by mouth 2 (two) times daily.  06/24/16  Yes Historical Provider, MD  pravastatin (PRAVACHOL) 20 MG tablet Take 20 mg by mouth daily. 12/27/15  Yes Historical Provider, MD    Allergies: No Known Allergies  Social History:  reports that he has never smoked. He has never used smokeless tobacco. He reports that he does not drink alcohol. His drug history is not on file.   Family History: Family History  Problem Relation Age of Onset  . Diabetes Mother   . . . Diabetes Colon cancer Breast cancer Father Uncle Sister     Review of Systems: Review of Systems  Constitutional: Negative for chills and fever.  HENT: Negative for hearing loss.   Eyes: Negative for blurred vision.  Respiratory: Negative for cough and shortness of breath.   Cardiovascular: Negative for chest pain and leg swelling.  Gastrointestinal: Positive for abdominal pain, constipation, nausea and vomiting. Negative  for blood in stool, diarrhea and heartburn.  Genitourinary: Negative for dysuria and hematuria.  Musculoskeletal: Negative for myalgias.  Skin: Negative for rash.  Neurological: Negative for dizziness.  Psychiatric/Behavioral: Negative for depression.    Physical Exam BP 138/71 (BP Location: Left Arm)   Pulse 77   Temp 98.7 F (37.1 C) (Oral)   Resp 20   Ht '5\' 5"'$  (1.651 m)   Wt 86.6 kg (191 lb)   SpO2 94%   BMI 31.78  kg/m  CONSTITUTIONAL:  No acute distress HEENT:  Normocephalic, atraumatic, extraocular motion intact. NECK: Trachea is midline, and there is no jugular venous distension.  RESPIRATORY:  Lungs are clear, and breath sounds are equal bilaterally. Normal respiratory effort without pathologic use of accessory muscles. CARDIOVASCULAR: Heart is regular without murmurs, gallops, or rubs. GI: The abdomen is soft, distended, non-tender to palpation.  No peritoneal signs.  Tympanic on percussion.  NG tube in place with gastric contents in cannister.  MUSCULOSKELETAL:  Normal muscle strength and tone in all four extremities.  No peripheral edema or cyanosis. SKIN: Skin turgor is normal. There are no pathologic skin lesions.  NEUROLOGIC:  Motor and sensation is grossly normal.  Cranial nerves are grossly intact. PSYCH:  Alert and oriented to person, place and time. Affect is normal.  Laboratory Analysis: Results for orders placed or performed during the hospital encounter of 02/05/17 (from the past 24 hour(s))  Lipase, blood     Status: None   Collection Time: 02/05/17  5:52 PM  Result Value Ref Range   Lipase 13 11 - 51 U/L  Comprehensive metabolic panel     Status: Abnormal   Collection Time: 02/05/17  5:52 PM  Result Value Ref Range   Sodium 136 135 - 145 mmol/L   Potassium 3.8 3.5 - 5.1 mmol/L   Chloride 103 101 - 111 mmol/L   CO2 22 22 - 32 mmol/L   Glucose, Bld 230 (H) 65 - 99 mg/dL   BUN 21 (H) 6 - 20 mg/dL   Creatinine, Ser 1.04 0.61 - 1.24 mg/dL   Calcium 9.5 8.9 - 10.3 mg/dL   Total Protein 8.1 6.5 - 8.1 g/dL   Albumin 4.4 3.5 - 5.0 g/dL   AST 16 15 - 41 U/L   ALT 13 (L) 17 - 63 U/L   Alkaline Phosphatase 66 38 - 126 U/L   Total Bilirubin 0.6 0.3 - 1.2 mg/dL   GFR calc non Af Amer >60 >60 mL/min   GFR calc Af Amer >60 >60 mL/min   Anion gap 11 5 - 15  CBC     Status: Abnormal   Collection Time: 02/05/17  5:52 PM  Result Value Ref Range   WBC 14.3 (H) 3.8 - 10.6 K/uL   RBC  5.29 4.40 - 5.90 MIL/uL   Hemoglobin 15.0 13.0 - 18.0 g/dL   HCT 44.9 40.0 - 52.0 %   MCV 84.9 80.0 - 100.0 fL   MCH 28.3 26.0 - 34.0 pg   MCHC 33.3 32.0 - 36.0 g/dL   RDW 14.1 11.5 - 14.5 %   Platelets 319 150 - 440 K/uL  Urinalysis, Complete w Microscopic     Status: Abnormal   Collection Time: 02/05/17  5:52 PM  Result Value Ref Range   Color, Urine YELLOW (A) YELLOW   APPearance HAZY (A) CLEAR   Specific Gravity, Urine 1.027 1.005 - 1.030   pH 5.0 5.0 - 8.0   Glucose, UA 150 (A) NEGATIVE mg/dL  Hgb urine dipstick NEGATIVE NEGATIVE   Bilirubin Urine NEGATIVE NEGATIVE   Ketones, ur 5 (A) NEGATIVE mg/dL   Protein, ur 30 (A) NEGATIVE mg/dL   Nitrite NEGATIVE NEGATIVE   Leukocytes, UA NEGATIVE NEGATIVE   RBC / HPF 0-5 0 - 5 RBC/hpf   WBC, UA 0-5 0 - 5 WBC/hpf   Bacteria, UA NONE SEEN NONE SEEN   Squamous Epithelial / LPF 0-5 (A) NONE SEEN   Mucous PRESENT    Hyaline Casts, UA PRESENT     Imaging: Ct Abdomen Pelvis W Contrast  Result Date: 02/05/2017 CLINICAL DATA:  Constipation. Small bowel obstruction on abdominal radiograph. EXAM: CT ABDOMEN AND PELVIS WITH CONTRAST TECHNIQUE: Multidetector CT imaging of the abdomen and pelvis was performed using the standard protocol following bolus administration of intravenous contrast. CONTRAST:  158m ISOVUE-300 IOPAMIDOL (ISOVUE-300) INJECTION 61% COMPARISON:  Abdominal radiographs earlier this day. FINDINGS: Lower chest: Subpleural 6 mm nodule in the lingula image 6 series 4. Scattered basilar atelectasis. Coronary artery calcifications versus stents. Heart size is normal. Hepatobiliary: No focal hepatic lesion. Calcified gallstones without abnormal gallbladder distention or pericholecystic inflammation. No biliary dilatation. Pancreas: No ductal dilatation or inflammation. Spleen: Normal in size without focal abnormality. Adrenals/Urinary Tract: Minimal left adrenal thickening without dominant nodule. Right adrenal gland is normal. Kidneys  demonstrate symmetric enhancement and excretion on delayed phase imaging. Mild thinning of both renal parenchyma. No hydronephrosis. No suspicious renal lesion. Urinary bladder is physiologically distended. Stomach/Bowel: Short segment of circumferential wall thickening in the junction of the descending and sigmoid colon with an apple-core appearance. There is proximal colonic and small bowel dilatation, with significant descending and mild transverse colonic tortuosity. Colon proximal to this is fluid-filled without pericolonic edema. There is progressive small bowel dilatation of distal ileal bowel loops likely secondary to colonic obstruction. The appendix is fluid-filled and dilated measuring 11 mm, no periappendiceal inflammation. The distal descending and sigmoid colon distal to the colonic lesion or decompressed. Small hiatal hernia. No abnormal gastric distension. Vascular/Lymphatic: Aortic atherosclerosis without aneurysm. Tiny tiny left lower quadrant lymph node more distal to the suspected colonic lesion measures 6 mm. Reproductive: Prostatic calcifications. Other: No ascites or free air. No intra-abdominal abscess. Tiny fat containing umbilical hernia. Musculoskeletal: Small lucency in T10 vertebral body, possible stippled hemangioma but nonspecific. Small lucency within L3 vertebral body. No acute osseous abnormality. IMPRESSION: 1. Short segment colonic thickening at the junction of the descending and sigmoid colon, apple core appearance highly suspicious for primary colonic neoplasm. This causes proximal colonic obstruction and resultant distal small bowel dilatation. Fluid-filled dilated appendix felt to be secondary to back pressure changes, no appendiceal inflammation. 2. Nonspecific 6 mm pericolonic lymph node. No evidence of hepatic metastatic disease. 3. Subpleural 6 mm nodule in the lingula. 4. Small vertebral body lucencies are nonspecific, lesion within T10 may be a hemangioma. Given presumed  colonic neoplasm, PET-CT or bone scan could further evaluate. 5. Intra-abdominal atherosclerosis without aneurysm. Electronically Signed   By: MJeb LeveringM.D.   On: 02/05/2017 21:44   Dg Abd Acute W/chest  Result Date: 02/05/2017 CLINICAL DATA:  Constipation x1 week with urinary frequency x2 days. History of renal stones. Nonsmoker. EXAM: DG ABDOMEN ACUTE W/ 1V CHEST COMPARISON:  None. FINDINGS: Dilated small bowel loops with air-fluid levels consistent with small bowel obstruction. Heart is top-normal in size. There is aortic atherosclerosis. There is eventration of right hemidiaphragm. Minimal atelectasis the left lung base. There is no free air. Calcifications in the pelvis bilaterally  consistent with vascular phleboliths. IMPRESSION: Abnormal bowel gas pattern with air-fluid levels and small bowel dilatation consistent with small-bowel obstruction. Left basilar atelectasis. Electronically Signed   By: Ashley Royalty M.D.   On: 02/05/2017 19:55    Assessment and Plan: This is a 76 y.o. male who presents with new diagnosis of a colonic mass causing a large bowel obstruction.  I have independently viewed the patient's laboratory and imaging studies and discussed them with the patient and the ED team.  Overall KUB showed possible SBO due to dilated small bowel loops with air-fluid levels.  However, CT scan shows an apple-core circumferential lesion in the colon at the junction with descending / sigmoid colon.  There is concern for a possible T10 and L3 lesions, though may be hemangiomas.  Patient never had a colonoscopy.   Patient will be admitted to the general surgery team.  He had an NG tube placed in the ED.  He will be NPO with IV fluid hydration.  Currently there is suspicion for a colonic neoplasm given its appearance on CT scan.  Will consult gastroenterology for colonoscopy or flex sigmoidoscopy for tissue diagnosis.  The patient has not had a bowel movement in 1 week, but there is no impending  rupture as the small bowel also fills with fluid, showing a likely incompetent ileocecal valve.  Will order CEA level for the AM and CXR.  Will also consult oncology for further workup assistance given the uncertain Z35 and L3 lesions.  There is no emergent surgical need at this time and we can proceed with further workup prior to possible resection.  The patient understands this plan and all of his questions have been answered.    Melvyn Neth, [place]

## 2017-02-06 NOTE — Progress Notes (Addendum)
Patient ID: Miguel Flores, male   DOB: 07-30-1941, 76 y.o.   MRN: [id number] Pt admitted last night with 1 week history of constipation and abdominal distension. CT suggests obstruction in proximal sigmoid with apple core lesion.  Pt is awake, not voicing any new complaints. AVSS. Abdomen is distended, scant tenderness, hypoactive bowel sounds. Labs- K 3.5, Creat 1.2, WBC 13K Discussed findings with pt.  GI consult requested by Dr. Hampton Abbot. CEA ordered CT scan reviewed. Feel pt  Will need laparotomy, colon resection with Hartman's  He does appear mildly dehydrated. Will hydrate and consider surgery tomorrow.  GI service not available till tomorrow. Plan for flex sig to assess site of obstruction. Pt and his Miguel Flores were advised and they are all agreeable. Given his current condition plan to do this with anesthesia care.

## 2017-02-06 NOTE — Transfer of Care (Signed)
   Immediate Anesthesia Transfer of Care Note  Patient: Miguel Flores  Procedure(s) Performed: Procedure(s): FLEXIBLE SIGMOIDOSCOPY (N/A)  Patient Location: PACU  Anesthesia Type:General  Level of Consciousness: awake and sedated  Airway & Oxygen Therapy: Patient Spontanous Breathing and Patient connected to nasal cannula oxygen  Post-op Assessment: Report given to RN and Post -op Vital signs reviewed and stable  Post vital signs: Reviewed and stable  Last Vitals:  Vitals:   02/06/17 1226 02/06/17 1356  BP: (!) 129/59 133/63  Pulse: 75 78  Resp: 20 20  Temp: 36.7 C 37.1 C    Last Pain:  Vitals:   02/06/17 1356  TempSrc: Tympanic  PainSc:          Complications: No apparent anesthesia complications

## 2017-02-06 NOTE — Consult Note (Signed)
Patient unavailable for consultation as he is currently in endoscopy. CT scan results reviewed independently highly suspicious for underlying malignancy. Will await endoscopy results for further evaluation. CEA is pending at time of dictation.  Appreciate consult, will follow.

## 2017-02-06 NOTE — Op Note (Signed)
[facility] Gastroenterology Patient Name: Miguel Flores Procedure Date: 02/06/2017 2:35 PM MRN: [id number] Account #: [id number] Date of Birth: 1941/08/01 Admit Type: Inpatient Age: 76 Room: [facility] ENDO ROOM 4 Gender: Male Note Status: Finalized Procedure:            Flexible Sigmoidoscopy Indications:          Abnormal CT of the GI tract, Preoperative assessment Providers:            Vickey Ewbank G. Jamal Collin, MD Referring MD:         [name] (Referring MD) Medicines:            Monitored Anesthesia Care Complications:        No immediate complications. Procedure:            Pre-Anesthesia Assessment:                       - The anesthesia plan was to use moderate                        sedation/analgesia (conscious sedation).                       - The anesthesia plan was to use moderate                        sedation/analgesia (conscious sedation).                       After obtaining informed consent, the scope was passed                        under direct vision. The Colonoscope was introduced                        through the anus and advanced to the the sigmoid colon.                        The flexible sigmoidoscopy was accomplished without                        difficulty. The patient tolerated the procedure well.                        The quality of the bowel preparation was good. Findings:      The perianal and digital rectal examinations were normal.      A 3 mm polyp was found in the recto-sigmoid colon. The polyp was       sessile. Biopsies were taken with a cold forceps for histology.      An infiltrative completely obstructing large mass was found in the       proximal sigmoid colon. Oozing was present. Biopsies were taken with a       cold forceps for histology.      The exam was otherwise without abnormality. Impression:           - One 3 mm polyp at the recto-sigmoid colon. Biopsied.                       - Completely obstructing  tumor in the proximal sigmoid  colon. Biopsied.                       - The examination was otherwise normal. Recommendation:       - Refer to surgeon for a segmental resection. Procedure Code(s):    --- Professional ---                       [phone], Sigmoidoscopy, flexible; with biopsy, single or                        multiple Diagnosis Code(s):    --- Professional ---                       D12.7, Benign neoplasm of rectosigmoid junction                       D49.0, Neoplasm of unspecified behavior of digestive                        system                       Z01.818, Encounter for other preprocedural examination                       R93.3, Abnormal findings on diagnostic imaging of other                        parts of digestive tract CPT copyright 2016 [employer]. All rights reserved. The codes documented in this report are preliminary and upon coder review may  be revised to meet current compliance requirements. Christene Lye, MD 02/06/2017 3:17:23 PM This report has been signed electronically. Number of Addenda: 0 Note Initiated On: 02/06/2017 2:35 PM Total Procedure Duration: 0 hours 15 minutes 23 seconds       [facility]

## 2017-02-06 NOTE — Anesthesia Procedure Notes (Signed)
Procedure Name: Intubation Performed by: Lance Muss Pre-anesthesia Checklist: Patient identified, Patient being monitored, Timeout performed, Emergency Drugs available and Suction available Patient Re-evaluated:Patient Re-evaluated prior to inductionOxygen Delivery Method: Circle system utilized Preoxygenation: Pre-oxygenation with 100% oxygen Intubation Type: IV induction, Cricoid Pressure applied and Rapid sequence Laryngoscope Size: 3 and McGraph Grade View: Grade I Tube type: Oral Tube size: 7.5 mm Number of attempts: 1 Airway Equipment and Method: Stylet Placement Confirmation: ETT inserted through vocal cords under direct vision,  positive ETCO2 and breath sounds checked- equal and bilateral Secured at: 22 cm Tube secured with: Tape Dental Injury: Teeth and Oropharynx as per pre-operative assessment

## 2017-02-07 ENCOUNTER — Encounter: Payer: Self-pay | Admitting: Surgery

## 2017-02-07 DIAGNOSIS — Z933 Colostomy status: Secondary | ICD-10-CM

## 2017-02-07 DIAGNOSIS — Z9889 Other specified postprocedural states: Secondary | ICD-10-CM

## 2017-02-07 DIAGNOSIS — R531 Weakness: Secondary | ICD-10-CM

## 2017-02-07 DIAGNOSIS — R110 Nausea: Secondary | ICD-10-CM

## 2017-02-07 DIAGNOSIS — K5900 Constipation, unspecified: Secondary | ICD-10-CM

## 2017-02-07 DIAGNOSIS — R970 Elevated carcinoembryonic antigen [CEA]: Secondary | ICD-10-CM

## 2017-02-07 DIAGNOSIS — E785 Hyperlipidemia, unspecified: Secondary | ICD-10-CM

## 2017-02-07 DIAGNOSIS — I10 Essential (primary) hypertension: Secondary | ICD-10-CM

## 2017-02-07 DIAGNOSIS — R5383 Other fatigue: Secondary | ICD-10-CM

## 2017-02-07 DIAGNOSIS — R5381 Other malaise: Secondary | ICD-10-CM

## 2017-02-07 DIAGNOSIS — Z794 Long term (current) use of insulin: Secondary | ICD-10-CM

## 2017-02-07 DIAGNOSIS — E119 Type 2 diabetes mellitus without complications: Secondary | ICD-10-CM

## 2017-02-07 DIAGNOSIS — Z79899 Other long term (current) drug therapy: Secondary | ICD-10-CM

## 2017-02-07 DIAGNOSIS — R109 Unspecified abdominal pain: Secondary | ICD-10-CM

## 2017-02-07 LAB — BASIC METABOLIC PANEL
Anion gap: 8 (ref 5–15)
BUN: 24 mg/dL — AB (ref 6–20)
CALCIUM: 7.9 mg/dL — AB (ref 8.9–10.3)
CO2: 22 mmol/L (ref 22–32)
Chloride: 109 mmol/L (ref 101–111)
Creatinine, Ser: 1.08 mg/dL (ref 0.61–1.24)
GFR calc Af Amer: >60 mL/min (ref >60)
GLUCOSE: 157 mg/dL — AB (ref 65–99)
Potassium: 3.4 mmol/L — ABNORMAL LOW (ref 3.5–5.1)
SODIUM: 139 mmol/L (ref 135–145)

## 2017-02-07 LAB — CBC WITH DIFFERENTIAL/PLATELET
BASOS ABS: 0 10*3/uL (ref 0–0.1)
Basophils Relative: 0 %
EOS ABS: 0 10*3/uL (ref 0–0.7)
EOS PCT: 0 %
HCT: 38.3 % — ABNORMAL LOW (ref 40.0–52.0)
Hemoglobin: 12.9 g/dL — ABNORMAL LOW (ref 13.0–18.0)
LYMPHS ABS: 1.4 10*3/uL (ref 1.0–3.6)
LYMPHS PCT: 29 %
MCH: 29.1 pg (ref 26.0–34.0)
MCHC: 33.7 g/dL (ref 32.0–36.0)
MCV: 86.3 fL (ref 80.0–100.0)
MONO ABS: 0.6 10*3/uL (ref 0.2–1.0)
Monocytes Relative: 12 %
Neutro Abs: 2.9 10*3/uL (ref 1.4–6.5)
Neutrophils Relative %: 59 %
PLATELETS: 258 10*3/uL (ref 150–440)
RBC: 4.43 MIL/uL (ref 4.40–5.90)
RDW: 13.7 % (ref 11.5–14.5)
WBC: 5 10*3/uL (ref 3.8–10.6)

## 2017-02-07 LAB — CEA: CEA: 5 ng/mL — AB (ref 0.0–4.7)

## 2017-02-07 LAB — GLUCOSE, CAPILLARY
GLUCOSE-CAPILLARY: 146 mg/dL — AB (ref 65–99)
GLUCOSE-CAPILLARY: 156 mg/dL — AB (ref 65–99)
GLUCOSE-CAPILLARY: 188 mg/dL — AB (ref 65–99)
GLUCOSE-CAPILLARY: 200 mg/dL — AB (ref 65–99)
GLUCOSE-CAPILLARY: 207 mg/dL — AB (ref 65–99)
Glucose-Capillary: 246 mg/dL — ABNORMAL HIGH (ref 65–99)

## 2017-02-07 MED ORDER — KCL IN DEXTROSE-NACL 20-5-0.45 MEQ/L-%-% IV SOLN
INTRAVENOUS | Status: DC
  Administered 2017-02-07: 11:00:00 via INTRAVENOUS
  Filled 2017-02-07 (×3): qty 1000

## 2017-02-07 MED ORDER — POTASSIUM CHLORIDE 2 MEQ/ML IV SOLN
INTRAVENOUS | Status: DC
  Filled 2017-02-07 (×2): qty 1000

## 2017-02-07 MED ORDER — KCL IN DEXTROSE-NACL 10-5-0.45 MEQ/L-%-% IV SOLN
INTRAVENOUS | Status: DC
  Administered 2017-02-07: 10:00:00 via INTRAVENOUS
  Filled 2017-02-07: qty 1000

## 2017-02-07 NOTE — Plan of Care (Signed)
Problem: Fluid Volume: Goal: Ability to maintain a balanced intake and output will improve Outcome: Not Progressing Patient is NPO except ice chips.  Problem: Nutrition: Goal: Adequate nutrition will be maintained Outcome: Not Progressing Patient is NPO except ice chips.  Problem: Bowel/Gastric: Goal: Will not experience complications related to bowel motility Outcome: Not Progressing Patient had surgery to resolve bowel obstruction.

## 2017-02-07 NOTE — Consult Note (Addendum)
Consultation  Referring Provider:     No ref. provider found Primary Care Physician:  [name], MD Primary Gastroenterologist: None  Reason for Consultation: Large bowel obstruction  Date of Admission:  02/05/2017 Date of Consultation:  02/07/2017         HPI:   Miguel Flores is a 76 y.o. male with a history notable for DM and HTN who presented to the ER with one week of constipation and worsening abdominal distention.   The patient reports being in his USOH until about 4 months ago when he was diagnosed with anemia and started on oral iron supplementation. It was around this time when he noted constipation, but he attributed that to his iron supplementation. Of note, he never had a colonoscopy. He presented to the [facility] ER on 3/29 and had imaging concerning for a sigmoid mass with T10 & L3 lesions concerning for metastases. 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   This morning, the patient reports feeling much better than presentation and notes that his abdominal distention is significant improved.   Past Medical History:  Diagnosis Date  . Cancer ([place])   . Diabetes mellitus without complication ([place])   . Hyperlipidemia   . Hypertension     Past Surgical History:  Procedure Laterality Date  . CYSTOSCOPY      Prior to Admission medications   Medication Sig Start Date End Date Taking? Authorizing Provider  Docusate Sodium (DOCU PO) Take 1 capsule by mouth 2 (two) times daily.   Yes Historical Provider, MD  ferrous sulfate 324 (65 Fe) MG TBEC Take 324 mg by mouth daily. 02/03/17  Yes Historical Provider, MD  glimepiride (AMARYL) 2 MG tablet Take 2 mg by mouth daily. 08/14/15  Yes Historical Provider, MD  Glucosamine-Chondroitin 500-400 MG CAPS Take 1 tablet by mouth 2 (two) times daily.   Yes Historical Provider,  MD  hydrocortisone (RECTACORT-HC) 25 MG suppository Place 1 suppository rectally 2 (two) times daily as needed. 01/24/16  Yes Historical Provider, MD  loratadine (CLARITIN) 10 MG tablet Take 10 mg by mouth daily.   Yes Historical Provider, MD  losartan (COZAAR) 25 MG tablet Take 25 mg by mouth daily. 03/27/16  Yes Historical Provider, MD  metFORMIN (GLUCOPHAGE) 1000 MG tablet Take 1,000-1,500 tablets by mouth 2 (two) times daily. Take 1 tablet in AM and 1 1/2 tablets at supper 08/07/16  Yes Historical Provider, MD  polyethylene glycol powder (GLYCOLAX/MIRALAX) powder Take 17 g by mouth 2 (two) times daily.  06/24/16  Yes Historical Provider, MD  pravastatin (PRAVACHOL) 20 MG tablet Take 20 mg by mouth daily. 12/27/15  Yes Historical Provider, MD    Family History  Problem Relation Age of Onset  . Diabetes Mother   . Diabetes Father      Social History  Substance Use Topics  . Smoking status: Never Smoker  . Smokeless tobacco: Never Used  . Alcohol use No    Allergies as of 02/05/2017  . (No Known Allergies)    Review of Systems:    All systems reviewed and negative except where noted in HPI.   Physical Exam:  Vital signs in last 24 hours: Temp:  [97 F (36.1 C)-100 F (37.8 C)] 98 F (36.7 C) (03/31 1300) Pulse Rate:  [70-88] 88 (03/31 1239) Resp:  [16-27] 20 (03/31 1248)  BP: (123-157)/(61-79) 139/71 (03/31 1239) SpO2:  [90 %-98 %] 92 % (03/31 1248) FiO2 (%):  [90 %-92 %] 92 % (03/31 1248) Last BM Date: 01/29/17   General: Older white male resting in bed comfortably Head:  Normocephalic and atraumatic. Eyes: No scleral icterus. Ears:  Normal auditory acuity. Neck:  Supple Lungs: Respirations even and unlabored. Lungs clear to auscultation bilaterally.   No wheezes, crackles, or rhonchi.  Heart:  Regular rate and rhythm;  Without murmur, clicks, rubs or gallops Abdomen: +BS, soft, mildly TTP, distended, but not tensely so, LLQ ostomy with brown stool in the bag  Rectal:   Not performed. Msk: Symmetrical without gross deformities. Extremities:  Without edema, cyanosis or clubbing. Neurologic: Alert, grossly normal neurologically. Skin:  Intact without significant lesions or rashes. Psych: Cooperative, appropriate affect  LAB RESULTS:  Recent Labs  02/05/17 1752 02/06/17 0515 02/07/17 0351  WBC 14.3* 13.1* 5.0  HGB 15.0 14.0 12.9*  HCT 44.9 41.5 38.3*  PLT 319 286 258   BMET  Recent Labs  02/05/17 1752 02/06/17 0515 02/07/17 0351  NA 136 136 139  K 3.8 3.5 3.4*  CL 103 106 109  CO2 '22 22 22  '$ GLUCOSE 230* 171* 157*  BUN 21* 27* 24*  CREATININE 1.04 1.20 1.08  CALCIUM 9.5 8.8* 7.9*   LFT  Recent Labs  02/05/17 1752  PROT 8.1  ALBUMIN 4.4  AST 16  ALT 13*  ALKPHOS 66  BILITOT 0.6   PT/INR No results for input(s): LABPROT, INR in the last 72 hours.  STUDIES: Dg Chest 2 View  Result Date: 02/06/2017 CLINICAL DATA:  Acute onset of nausea and generalized weakness. Initial encounter. EXAM: CHEST  2 VIEW COMPARISON:  Chest radiograph performed earlier today at 7:16 p.m. FINDINGS: The lungs are well-aerated. Mild left basilar atelectasis is noted. There is no evidence of pleural effusion or pneumothorax. The heart is borderline normal in size. No acute osseous abnormalities are seen. The patient's enteric tube is noted ending overlying the proximal duodenum. Anterior bridging osteophytes are noted along the thoracic spine. IMPRESSION: Mild left basilar atelectasis noted.  Lungs otherwise clear. Electronically Signed   By: Garald Balding M.D.   On: 02/06/2017 00:19   Dg Abdomen 1 View  Result Date: 02/06/2017 CLINICAL DATA:  Nasogastric tube placement.  Initial encounter. EXAM: ABDOMEN - 1 VIEW COMPARISON:  CT of the abdomen and pelvis performed earlier today at 9:17 p.m. FINDINGS: Dilated small and large bowel loops are again noted, with multiple air-fluid levels, reflecting obstruction secondary to the patient's colonic lesion. No free  intra-abdominal air is seen on this provided upright view. The patient's enteric tube is noted ending overlying the proximal duodenum. No acute osseous abnormalities are seen. IMPRESSION: 1. Enteric tube noted ending overlying the proximal duodenum. 2. Dilated small and large bowel loops again noted, with multiple air-fluid levels, reflecting obstruction secondary to the patient's colonic lesion. No free intra-abdominal air seen. Electronically Signed   By: Garald Balding M.D.   On: 02/06/2017 00:18   Ct Abdomen Pelvis W Contrast  Result Date: 02/05/2017 CLINICAL DATA:  Constipation. Small bowel obstruction on abdominal radiograph. EXAM: CT ABDOMEN AND PELVIS WITH CONTRAST TECHNIQUE: Multidetector CT imaging of the abdomen and pelvis was performed using the standard protocol following bolus administration of intravenous contrast. CONTRAST:  142m ISOVUE-300 IOPAMIDOL (ISOVUE-300) INJECTION 61% COMPARISON:  Abdominal radiographs earlier this day. FINDINGS: Lower chest: Subpleural 6 mm nodule in the lingula image 6 series 4. Scattered basilar atelectasis.  Coronary artery calcifications versus stents. Heart size is normal. Hepatobiliary: No focal hepatic lesion. Calcified gallstones without abnormal gallbladder distention or pericholecystic inflammation. No biliary dilatation. Pancreas: No ductal dilatation or inflammation. Spleen: Normal in size without focal abnormality. Adrenals/Urinary Tract: Minimal left adrenal thickening without dominant nodule. Right adrenal gland is normal. Kidneys demonstrate symmetric enhancement and excretion on delayed phase imaging. Mild thinning of both renal parenchyma. No hydronephrosis. No suspicious renal lesion. Urinary bladder is physiologically distended. Stomach/Bowel: Short segment of circumferential wall thickening in the junction of the descending and sigmoid colon with an apple-core appearance. There is proximal colonic and small bowel dilatation, with significant  descending and mild transverse colonic tortuosity. Colon proximal to this is fluid-filled without pericolonic edema. There is progressive small bowel dilatation of distal ileal bowel loops likely secondary to colonic obstruction. The appendix is fluid-filled and dilated measuring 11 mm, no periappendiceal inflammation. The distal descending and sigmoid colon distal to the colonic lesion or decompressed. Small hiatal hernia. No abnormal gastric distension. Vascular/Lymphatic: Aortic atherosclerosis without aneurysm. Tiny tiny left lower quadrant lymph node more distal to the suspected colonic lesion measures 6 mm. Reproductive: Prostatic calcifications. Other: No ascites or free air. No intra-abdominal abscess. Tiny fat containing umbilical hernia. Musculoskeletal: Small lucency in T10 vertebral body, possible stippled hemangioma but nonspecific. Small lucency within L3 vertebral body. No acute osseous abnormality. IMPRESSION: 1. Short segment colonic thickening at the junction of the descending and sigmoid colon, apple core appearance highly suspicious for primary colonic neoplasm. This causes proximal colonic obstruction and resultant distal small bowel dilatation. Fluid-filled dilated appendix felt to be secondary to back pressure changes, no appendiceal inflammation. 2. Nonspecific 6 mm pericolonic lymph node. No evidence of hepatic metastatic disease. 3. Subpleural 6 mm nodule in the lingula. 4. Small vertebral body lucencies are nonspecific, lesion within T10 may be a hemangioma. Given presumed colonic neoplasm, PET-CT or bone scan could further evaluate. 5. Intra-abdominal atherosclerosis without aneurysm. Electronically Signed   By: Jeb Levering M.D.   On: 02/05/2017 21:44   Dg Abd Acute W/chest  Result Date: 02/05/2017 CLINICAL DATA:  Constipation x1 week with urinary frequency x2 days. History of renal stones. Nonsmoker. EXAM: DG ABDOMEN ACUTE W/ 1V CHEST COMPARISON:  None. FINDINGS: Dilated small  bowel loops with air-fluid levels consistent with small bowel obstruction. Heart is top-normal in size. There is aortic atherosclerosis. There is eventration of right hemidiaphragm. Minimal atelectasis the left lung base. There is no free air. Calcifications in the pelvis bilaterally consistent with vascular phleboliths. IMPRESSION: Abnormal bowel gas pattern with air-fluid levels and small bowel dilatation consistent with small-bowel obstruction. Left basilar atelectasis. Electronically Signed   By: Ashley Royalty M.D.   On: 02/05/2017 19:55      Impression / Plan:   KINAN SAFLEY is a 76 y.o. y/o male with a history notable for DM and HTN who presented to the ER with one week of constipation and worsening abdominal distention and was found to have an obstructing sigmoid mass most concerning for a malignancy, possibly metastatic. At the time of my evaluation, the patient has had surgical management of his large bowel obstruction. He is improving clinically. Medical oncology has weighed in as well. Pathology is pending. He is getting excellent care. Therefore, at this time, I have no further recommendations from a medical gastroenterology standpoint. He will certainly need outpatient follow up with both surgery and medical oncology. In 6 months or so, he should have a complete colonoscopy as an  outpatient.  Thank you for involving me in the care of this patient. I will sign off at this time.    LOS: 2 days   Lisbeth Renshaw, MD  02/07/2017, 3:34 PM

## 2017-02-07 NOTE — Progress Notes (Signed)
CH received an OR for an AD. Pt in [id number] had requested for an AD. CH met with the Pt and educated the Pt on how to complete an AD. While CH was educating the Pt, Dc and Pt's son arrived at the same. [place] visit interrupted briefly by the Dc, but CH was able to complete the visit. Pt requested more time to go over the AD with his son before completing it. [place] provided pastoral ministry and presence. [place] also informed Pt that [facility] services were available as needed.    02/07/17 1400  Clinical Encounter Type  Visited With Patient;Family  Visit Type Initial;ED  Referral From Nurse  Consult/Referral To Chaplain  Spiritual Encounters  Spiritual Needs Literature;Brochure  Stress Factors  Patient Stress Factors Exhausted  Family Stress Factors Major life changes

## 2017-02-07 NOTE — Progress Notes (Signed)
Patient ID: Miguel Flores, male   DOB: [birth year], 76 y.o.   MRN: [id number] Patient appears to be in much better frame of mind today. Says he feels better. Vital signs are stable Abdomen is less distended with some bowel sounds Colostomy is functioning with a large output. Incisions clean Labs are okay except for a potassium of 3.4 Encourage patient to get out of bed and amylase today Overall doing well

## 2017-02-07 NOTE — Consult Note (Signed)
[place]  [phone] [phone]  ID: FLEM ENDERLE OB: 10/25/1941  MR#: [id number]  KGU#:[id number]  Patient Care Team: [name], MD as PCP - General (Family Medicine)  CHIEF COMPLAINT: Obstructing sigmoid colon lesion highly suspicious for malignancy.  INTERVAL HISTORY: Patient is a 76 year old male who presented to the emergency room with a one-week history of constipation and progressive abdominal distention. He also complained of increasing nausea as well as a crampy abdominal pain. Subsequent CT scan revealed a nearly obstructing lesion in his sigmoid colon suspicious for underlying malignancy. 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 He is less than 24 hours postop, but feels significantly improved. He has no neurologic complaints. He denies any fevers. He has a fair appetite, but does not report weight loss. He has no chest pain or shortness of breath. He has no urinary complaints. Patient otherwise feels well and offers no further specific complaints.  REVIEW OF SYSTEMS:   Review of Systems  Constitutional: Positive for malaise/fatigue. Negative for fever and weight loss.  Respiratory: Negative.  Negative for cough and shortness of breath.   Cardiovascular: Negative.  Negative for chest pain and leg swelling.  Gastrointestinal: Positive for abdominal pain, constipation and nausea. Negative for blood in stool and melena.  Genitourinary: Negative.   Musculoskeletal: Negative.   Neurological: Positive for weakness.  Psychiatric/Behavioral: Negative.  The patient is not nervous/anxious.     As per HPI. Otherwise, a complete review of systems is negative.  PAST MEDICAL HISTORY: Past Medical History:  Diagnosis Date  . Cancer ([place])   . Diabetes mellitus without complication ([place])   . Hyperlipidemia   . Hypertension     PAST SURGICAL HISTORY: Past Surgical History:  Procedure Laterality Date    . CYSTOSCOPY      FAMILY HISTORY: Family History  Problem Relation Age of Onset  . Diabetes Mother   . Diabetes Father     ADVANCED DIRECTIVES (Y/N):  '@ADVDIR'$ @  HEALTH MAINTENANCE: Social History  Substance Use Topics  . Smoking status: Never Smoker  . Smokeless tobacco: Never Used  . Alcohol use No     Colonoscopy:  PAP:  Bone density:  Lipid panel:  No Known Allergies  Current Facility-Administered Medications  Medication Dose Route Frequency Provider Last Rate Last Dose  . dextrose 5 % and 0.45 % NaCl with KCl 10 mEq/L infusion   Intravenous Continuous Olean Ree, MD 50 mL/hr at 02/07/17 1005    . diphenhydrAMINE (BENADRYL) injection 12.5 mg  12.5 mg Intravenous Q6H PRN [name], MD       Or  . diphenhydrAMINE (BENADRYL) 12.5 MG/5ML elixir 12.5 mg  12.5 mg Oral Q6H PRN [name], MD      . enoxaparin (LOVENOX) injection 40 mg  40 mg Subcutaneous Q24H Olean Ree, MD   40 mg at 02/06/17 2330  . hydrALAZINE (APRESOLINE) injection 10 mg  10 mg Intravenous Q6H PRN Olean Ree, MD      . HYDROmorphone (DILAUDID) 1 mg/mL PCA injection   Intravenous Q4H [name], MD      . insulin aspart (novoLOG) injection 0-15 Units  0-15 Units Subcutaneous Q4H Olean Ree, MD   2 Units at 02/07/17 [phone]  . ketorolac (TORADOL) 15 MG/ML injection 15 mg  15 mg Intravenous Q6H PRN Olean Ree, MD   15 mg at 02/06/17 1721  . lactated ringers infusion  125 mL/hr Intravenous Continuous Olean Ree, MD 125 mL/hr at 02/07/17  0802 125 mL/hr at 02/07/17 0802  . menthol-cetylpyridinium (CEPACOL) lozenge 3 mg  1 lozenge Oral PRN Seeplaputhur Robinette Haines, MD      . naloxone [facility]) injection 0.4 mg  0.4 mg Intravenous PRN [name], MD       And  . sodium chloride flush (NS) 0.9 % injection 9 mL  9 mL Intravenous PRN [name], MD      . ondansetron (ZOFRAN-ODT) disintegrating tablet 4 mg  4 mg Oral Q6H PRN Olean Ree, MD       Or  . ondansetron (ZOFRAN)  injection 4 mg  4 mg Intravenous Q6H PRN Olean Ree, MD   4 mg at 02/06/17 0401  . ondansetron (ZOFRAN) injection 4 mg  4 mg Intravenous Q6H PRN [name], MD      . pantoprazole (PROTONIX) injection 40 mg  40 mg Intravenous QHS Olean Ree, MD   40 mg at 02/05/17 2329    OBJECTIVE: Vitals:   02/07/17 0815 02/07/17 0831  BP:  (!) 141/66  Pulse:  82  Resp: 16 20  Temp:  98 F (36.7 C)     Body mass index is 30.37 kg/m.    ECOG FS:0 - Asymptomatic  General: Well-developed, well-nourished, no acute distress. Eyes: Pink conjunctiva, anicteric sclera. HEENT: Normocephalic, moist mucous membranes, clear oropharnyx. Lungs: Clear to auscultation bilaterally. Heart: Regular rate and rhythm. No rubs, murmurs, or gallops. Abdomen: Soft, nondistended. Colostomy noted, normoactive bowel sounds. Musculoskeletal: No edema, cyanosis, or clubbing. Neuro: Alert, answering all questions appropriately. Cranial nerves grossly intact. Skin: No rashes or petechiae noted. Psych: Normal affect. Lymphatics: No cervical, calvicular, axillary or inguinal LAD.   LAB RESULTS:  Lab Results  Component Value Date   NA 139 02/07/2017   K 3.4 (L) 02/07/2017   CL 109 02/07/2017   CO2 22 02/07/2017   GLUCOSE 157 (H) 02/07/2017   BUN 24 (H) 02/07/2017   CREATININE 1.08 02/07/2017   CALCIUM 7.9 (L) 02/07/2017   PROT 8.1 02/05/2017   ALBUMIN 4.4 02/05/2017   AST 16 02/05/2017   ALT 13 (L) 02/05/2017   ALKPHOS 66 02/05/2017   BILITOT 0.6 02/05/2017   GFRNONAA >60 02/07/2017   GFRAA >60 02/07/2017    Lab Results  Component Value Date   WBC 5.0 02/07/2017   NEUTROABS 2.9 02/07/2017   HGB 12.9 (L) 02/07/2017   HCT 38.3 (L) 02/07/2017   MCV 86.3 02/07/2017   PLT 258 02/07/2017   Lab Results  Component Value Date   CEA 5.0 (H) 02/06/2017     STUDIES: Dg Chest 2 View  Result Date: 02/06/2017 CLINICAL DATA:  Acute onset of nausea and generalized weakness. Initial encounter. EXAM: CHEST   2 VIEW COMPARISON:  Chest radiograph performed earlier today at 7:16 p.m. FINDINGS: The lungs are well-aerated. Mild left basilar atelectasis is noted. There is no evidence of pleural effusion or pneumothorax. The heart is borderline normal in size. No acute osseous abnormalities are seen. The patient's enteric tube is noted ending overlying the proximal duodenum. Anterior bridging osteophytes are noted along the thoracic spine. IMPRESSION: Mild left basilar atelectasis noted.  Lungs otherwise clear. Electronically Signed   By: Garald Balding M.D.   On: 02/06/2017 00:19   Dg Abdomen 1 View  Result Date: 02/06/2017 CLINICAL DATA:  Nasogastric tube placement.  Initial encounter. EXAM: ABDOMEN - 1 VIEW COMPARISON:  CT of the abdomen and pelvis performed earlier today at 9:17 p.m. FINDINGS: Dilated small and large bowel  loops are again noted, with multiple air-fluid levels, reflecting obstruction secondary to the patient's colonic lesion. No free intra-abdominal air is seen on this provided upright view. The patient's enteric tube is noted ending overlying the proximal duodenum. No acute osseous abnormalities are seen. IMPRESSION: 1. Enteric tube noted ending overlying the proximal duodenum. 2. Dilated small and large bowel loops again noted, with multiple air-fluid levels, reflecting obstruction secondary to the patient's colonic lesion. No free intra-abdominal air seen. Electronically Signed   By: Garald Balding M.D.   On: 02/06/2017 00:18   Ct Abdomen Pelvis W Contrast  Result Date: 02/05/2017 CLINICAL DATA:  Constipation. Small bowel obstruction on abdominal radiograph. EXAM: CT ABDOMEN AND PELVIS WITH CONTRAST TECHNIQUE: Multidetector CT imaging of the abdomen and pelvis was performed using the standard protocol following bolus administration of intravenous contrast. CONTRAST:  171m ISOVUE-300 IOPAMIDOL (ISOVUE-300) INJECTION 61% COMPARISON:  Abdominal radiographs earlier this day. FINDINGS: Lower chest:  Subpleural 6 mm nodule in the lingula image 6 series 4. Scattered basilar atelectasis. Coronary artery calcifications versus stents. Heart size is normal. Hepatobiliary: No focal hepatic lesion. Calcified gallstones without abnormal gallbladder distention or pericholecystic inflammation. No biliary dilatation. Pancreas: No ductal dilatation or inflammation. Spleen: Normal in size without focal abnormality. Adrenals/Urinary Tract: Minimal left adrenal thickening without dominant nodule. Right adrenal gland is normal. Kidneys demonstrate symmetric enhancement and excretion on delayed phase imaging. Mild thinning of both renal parenchyma. No hydronephrosis. No suspicious renal lesion. Urinary bladder is physiologically distended. Stomach/Bowel: Short segment of circumferential wall thickening in the junction of the descending and sigmoid colon with an apple-core appearance. There is proximal colonic and small bowel dilatation, with significant descending and mild transverse colonic tortuosity. Colon proximal to this is fluid-filled without pericolonic edema. There is progressive small bowel dilatation of distal ileal bowel loops likely secondary to colonic obstruction. The appendix is fluid-filled and dilated measuring 11 mm, no periappendiceal inflammation. The distal descending and sigmoid colon distal to the colonic lesion or decompressed. Small hiatal hernia. No abnormal gastric distension. Vascular/Lymphatic: Aortic atherosclerosis without aneurysm. Tiny tiny left lower quadrant lymph node more distal to the suspected colonic lesion measures 6 mm. Reproductive: Prostatic calcifications. Other: No ascites or free air. No intra-abdominal abscess. Tiny fat containing umbilical hernia. Musculoskeletal: Small lucency in T10 vertebral body, possible stippled hemangioma but nonspecific. Small lucency within L3 vertebral body. No acute osseous abnormality. IMPRESSION: 1. Short segment colonic thickening at the junction of  the descending and sigmoid colon, apple core appearance highly suspicious for primary colonic neoplasm. This causes proximal colonic obstruction and resultant distal small bowel dilatation. Fluid-filled dilated appendix felt to be secondary to back pressure changes, no appendiceal inflammation. 2. Nonspecific 6 mm pericolonic lymph node. No evidence of hepatic metastatic disease. 3. Subpleural 6 mm nodule in the lingula. 4. Small vertebral body lucencies are nonspecific, lesion within T10 may be a hemangioma. Given presumed colonic neoplasm, PET-CT or bone scan could further evaluate. 5. Intra-abdominal atherosclerosis without aneurysm. Electronically Signed   By: MJeb LeveringM.D.   On: 02/05/2017 21:44   Dg Abd Acute W/chest  Result Date: 02/05/2017 CLINICAL DATA:  Constipation x1 week with urinary frequency x2 days. History of renal stones. Nonsmoker. EXAM: DG ABDOMEN ACUTE W/ 1V CHEST COMPARISON:  None. FINDINGS: Dilated small bowel loops with air-fluid levels consistent with small bowel obstruction. Heart is top-normal in size. There is aortic atherosclerosis. There is eventration of right hemidiaphragm. Minimal atelectasis the left lung base. There is no free air.  Calcifications in the pelvis bilaterally consistent with vascular phleboliths. IMPRESSION: Abnormal bowel gas pattern with air-fluid levels and small bowel dilatation consistent with small-bowel obstruction. Left basilar atelectasis. Electronically Signed   By: Ashley Royalty M.D.   On: 02/05/2017 19:55    ASSESSMENT: Obstructing sigmoid colon lesion highly suspicious for malignancy.  PLAN:    1. Obstructing sigmoid colon lesion highly suspicious for malignancy: CT scan results reviewed independently. Patient underwent surgery yesterday and pathology is pending at time of dictation. If confirmed malignancy, patient will likely require adjuvant chemotherapy but will wait for final pathology and staging to make a determination. CEA is only  mildly elevated. No further intervention is needed at this time. If patient requires adjuvant treatment, would not initiate until at least 3-4 weeks postoperatively. Please insure patient has follow-up in the [place] 1-2 weeks after discharge for further evaluation and treatment planning.  Appreciate consult, will follow.  Lloyd Huger, MD   02/07/2017 10:20 AM

## 2017-02-07 NOTE — Progress Notes (Signed)
1 Day Post-Op  Subjective: Patient status post Hartman's procedure for obstructing sigmoid colon lesion suspect carcinoma. Today the patient feels much better he states that he thinks he is less distended and has had "massive" amounts of liquid stool from his ostomy. He states that his pain is well-controlled with the PCA.  Objective: Vital signs in last 24 hours: Temp:  [97 F (36.1 C)-99.4 F (37.4 C)] 98.5 F (36.9 C) (03/31 0418) Pulse Rate:  [70-81] 75 (03/31 0418) Resp:  [17-27] 18 (03/31 0418) BP: (123-157)/(59-79) 123/74 (03/31 0418) SpO2:  [90 %-98 %] 96 % (03/31 0418) FiO2 (%):  [90 %] 90 % (03/30 2318) Last BM Date: 01/29/17  Intake/Output from previous day: 03/30 0701 - 03/31 0700 In: 3240.3 [I.V.:3240.3] Out: 1755 [Urine:350; Emesis/NG output:630; Stool:725; Blood:50] Intake/Output this shift: Total I/O In: 1555 [I.V.:1555] Out: 1525 [Urine:350; Emesis/NG output:400; Stool:725; Blood:50]  Physical exam:  Abdominal exam is still very distended and tympanitic but nontender wound is clean ostomy is pink and functional with liquid stool in bag Calves are nontender Awake alert and oriented  Lab Results: CBC   Recent Labs  02/06/17 0515 02/07/17 0351  WBC 13.1* 5.0  HGB 14.0 12.9*  HCT 41.5 38.3*  PLT 286 258   BMET  Recent Labs  02/06/17 0515 02/07/17 0351  NA 136 139  K 3.5 3.4*  CL 106 109  CO2 22 22  GLUCOSE 171* 157*  BUN 27* 24*  CREATININE 1.20 1.08  CALCIUM 8.8* 7.9*   PT/INR No results for input(s): LABPROT, INR in the last 72 hours. ABG No results for input(s): PHART, HCO3 in the last 72 hours.  Invalid input(s): PCO2, PO2  Studies/Results: Dg Chest 2 View  Result Date: 02/06/2017 CLINICAL DATA:  Acute onset of nausea and generalized weakness. Initial encounter. EXAM: CHEST  2 VIEW COMPARISON:  Chest radiograph performed earlier today at 7:16 p.m. FINDINGS: The lungs are well-aerated. Mild left basilar atelectasis is noted. There  is no evidence of pleural effusion or pneumothorax. The heart is borderline normal in size. No acute osseous abnormalities are seen. The patient's enteric tube is noted ending overlying the proximal duodenum. Anterior bridging osteophytes are noted along the thoracic spine. IMPRESSION: Mild left basilar atelectasis noted.  Lungs otherwise clear. Electronically Signed   By: Garald Balding M.D.   On: 02/06/2017 00:19   Dg Abdomen 1 View  Result Date: 02/06/2017 CLINICAL DATA:  Nasogastric tube placement.  Initial encounter. EXAM: ABDOMEN - 1 VIEW COMPARISON:  CT of the abdomen and pelvis performed earlier today at 9:17 p.m. FINDINGS: Dilated small and large bowel loops are again noted, with multiple air-fluid levels, reflecting obstruction secondary to the patient's colonic lesion. No free intra-abdominal air is seen on this provided upright view. The patient's enteric tube is noted ending overlying the proximal duodenum. No acute osseous abnormalities are seen. IMPRESSION: 1. Enteric tube noted ending overlying the proximal duodenum. 2. Dilated small and large bowel loops again noted, with multiple air-fluid levels, reflecting obstruction secondary to the patient's colonic lesion. No free intra-abdominal air seen. Electronically Signed   By: Garald Balding M.D.   On: 02/06/2017 00:18   Ct Abdomen Pelvis W Contrast  Result Date: 02/05/2017 CLINICAL DATA:  Constipation. Small bowel obstruction on abdominal radiograph. EXAM: CT ABDOMEN AND PELVIS WITH CONTRAST TECHNIQUE: Multidetector CT imaging of the abdomen and pelvis was performed using the standard protocol following bolus administration of intravenous contrast. CONTRAST:  151m ISOVUE-300 IOPAMIDOL (ISOVUE-300) INJECTION 61% COMPARISON:  Abdominal radiographs earlier this day. FINDINGS: Lower chest: Subpleural 6 mm nodule in the lingula image 6 series 4. Scattered basilar atelectasis. Coronary artery calcifications versus stents. Heart size is normal.  Hepatobiliary: No focal hepatic lesion. Calcified gallstones without abnormal gallbladder distention or pericholecystic inflammation. No biliary dilatation. Pancreas: No ductal dilatation or inflammation. Spleen: Normal in size without focal abnormality. Adrenals/Urinary Tract: Minimal left adrenal thickening without dominant nodule. Right adrenal gland is normal. Kidneys demonstrate symmetric enhancement and excretion on delayed phase imaging. Mild thinning of both renal parenchyma. No hydronephrosis. No suspicious renal lesion. Urinary bladder is physiologically distended. Stomach/Bowel: Short segment of circumferential wall thickening in the junction of the descending and sigmoid colon with an apple-core appearance. There is proximal colonic and small bowel dilatation, with significant descending and mild transverse colonic tortuosity. Colon proximal to this is fluid-filled without pericolonic edema. There is progressive small bowel dilatation of distal ileal bowel loops likely secondary to colonic obstruction. The appendix is fluid-filled and dilated measuring 11 mm, no periappendiceal inflammation. The distal descending and sigmoid colon distal to the colonic lesion or decompressed. Small hiatal hernia. No abnormal gastric distension. Vascular/Lymphatic: Aortic atherosclerosis without aneurysm. Tiny tiny left lower quadrant lymph node more distal to the suspected colonic lesion measures 6 mm. Reproductive: Prostatic calcifications. Other: No ascites or free air. No intra-abdominal abscess. Tiny fat containing umbilical hernia. Musculoskeletal: Small lucency in T10 vertebral body, possible stippled hemangioma but nonspecific. Small lucency within L3 vertebral body. No acute osseous abnormality. IMPRESSION: 1. Short segment colonic thickening at the junction of the descending and sigmoid colon, apple core appearance highly suspicious for primary colonic neoplasm. This causes proximal colonic obstruction and  resultant distal small bowel dilatation. Fluid-filled dilated appendix felt to be secondary to back pressure changes, no appendiceal inflammation. 2. Nonspecific 6 mm pericolonic lymph node. No evidence of hepatic metastatic disease. 3. Subpleural 6 mm nodule in the lingula. 4. Small vertebral body lucencies are nonspecific, lesion within T10 may be a hemangioma. Given presumed colonic neoplasm, PET-CT or bone scan could further evaluate. 5. Intra-abdominal atherosclerosis without aneurysm. Electronically Signed   By: Jeb Levering M.D.   On: 02/05/2017 21:44   Dg Abd Acute W/chest  Result Date: 02/05/2017 CLINICAL DATA:  Constipation x1 week with urinary frequency x2 days. History of renal stones. Nonsmoker. EXAM: DG ABDOMEN ACUTE W/ 1V CHEST COMPARISON:  None. FINDINGS: Dilated small bowel loops with air-fluid levels consistent with small bowel obstruction. Heart is top-normal in size. There is aortic atherosclerosis. There is eventration of right hemidiaphragm. Minimal atelectasis the left lung base. There is no free air. Calcifications in the pelvis bilaterally consistent with vascular phleboliths. IMPRESSION: Abnormal bowel gas pattern with air-fluid levels and small bowel dilatation consistent with small-bowel obstruction. Left basilar atelectasis. Electronically Signed   By: Ashley Royalty M.D.   On: 02/05/2017 19:55    Anti-infectives: Anti-infectives    Start     Dose/Rate Route Frequency Ordered Stop   02/06/17 1939  ertapenem [facility]) 1 g in sodium chloride 0.9 % 50 mL IVPB     1 g 100 mL/hr over 30 Minutes Intravenous 30 min pre-op 02/06/17 1939 02/06/17 2038      Assessment/Plan: s/p Procedure(s): COLECTOMY WITH COLOSTOMY CREATION/HARTMANN PROCEDURE   Patient doing very well postop Hartman's procedure for obstructing sigmoid colon lesion. He remains very distended but has excellent large amounts of liquid stool from his functional ostomy. I will remove the nasogastric tube later  this morning as well as his  Foley catheter and I spoke with him about sparing clear liquid diet for comfort only at this time. He understood and agreed with this plan. I discussed this plan with nursing as well. We'll need to watch the potassium level but should correct once the nasogastric tube is out.  [name], MD, FACS  02/07/2017

## 2017-02-08 LAB — COMPREHENSIVE METABOLIC PANEL
ALBUMIN: 3 g/dL — AB (ref 3.5–5.0)
ALK PHOS: 53 U/L (ref 38–126)
ALT: 9 U/L — AB (ref 17–63)
ANION GAP: 6 (ref 5–15)
AST: 13 U/L — ABNORMAL LOW (ref 15–41)
BILIRUBIN TOTAL: 0.5 mg/dL (ref 0.3–1.2)
BUN: 15 mg/dL (ref 6–20)
CALCIUM: 8.1 mg/dL — AB (ref 8.9–10.3)
CO2: 25 mmol/L (ref 22–32)
CREATININE: 0.86 mg/dL (ref 0.61–1.24)
Chloride: 106 mmol/L (ref 101–111)
GFR calc non Af Amer: >60 mL/min (ref >60)
Glucose, Bld: 194 mg/dL — ABNORMAL HIGH (ref 65–99)
Potassium: 3.7 mmol/L (ref 3.5–5.1)
SODIUM: 137 mmol/L (ref 135–145)
TOTAL PROTEIN: 6.2 g/dL — AB (ref 6.5–8.1)

## 2017-02-08 LAB — CBC WITH DIFFERENTIAL/PLATELET
BASOS PCT: 1 %
Basophils Absolute: 0 10*3/uL (ref 0–0.1)
EOS ABS: 0.2 10*3/uL (ref 0–0.7)
Eosinophils Relative: 4 %
HEMATOCRIT: 36.3 % — AB (ref 40.0–52.0)
HEMOGLOBIN: 12.2 g/dL — AB (ref 13.0–18.0)
LYMPHS ABS: 2 10*3/uL (ref 1.0–3.6)
Lymphocytes Relative: 34 %
MCH: 29 pg (ref 26.0–34.0)
MCHC: 33.6 g/dL (ref 32.0–36.0)
MCV: 86.3 fL (ref 80.0–100.0)
MONOS PCT: 18 %
Monocytes Absolute: 1.1 10*3/uL — ABNORMAL HIGH (ref 0.2–1.0)
NEUTROS ABS: 2.6 10*3/uL (ref 1.4–6.5)
NEUTROS PCT: 43 %
Platelets: 227 10*3/uL (ref 150–440)
RBC: 4.2 MIL/uL — AB (ref 4.40–5.90)
RDW: 14 % (ref 11.5–14.5)
WBC: 5.9 10*3/uL (ref 3.8–10.6)

## 2017-02-08 LAB — GLUCOSE, CAPILLARY
GLUCOSE-CAPILLARY: 164 mg/dL — AB (ref 65–99)
GLUCOSE-CAPILLARY: 231 mg/dL — AB (ref 65–99)
GLUCOSE-CAPILLARY: 167 mg/dL — AB (ref 65–99)
Glucose-Capillary: 184 mg/dL — ABNORMAL HIGH (ref 65–99)
Glucose-Capillary: 188 mg/dL — ABNORMAL HIGH (ref 65–99)
Glucose-Capillary: 200 mg/dL — ABNORMAL HIGH (ref 65–99)

## 2017-02-08 MED ORDER — INSULIN ASPART 100 UNIT/ML ~~LOC~~ SOLN
0.0000 [IU] | Freq: Three times a day (TID) | SUBCUTANEOUS | Status: DC
  Administered 2017-02-08 (×2): 3 [IU] via SUBCUTANEOUS
  Administered 2017-02-08: 5 [IU] via SUBCUTANEOUS
  Administered 2017-02-09: 3 [IU] via SUBCUTANEOUS
  Administered 2017-02-09: 5 [IU] via SUBCUTANEOUS
  Administered 2017-02-09: 3 [IU] via SUBCUTANEOUS
  Administered 2017-02-10: 5 [IU] via SUBCUTANEOUS
  Administered 2017-02-10: 3 [IU] via SUBCUTANEOUS
  Administered 2017-02-10: 2 [IU] via SUBCUTANEOUS
  Administered 2017-02-11: 3 [IU] via SUBCUTANEOUS
  Administered 2017-02-11: 5 [IU] via SUBCUTANEOUS
  Filled 2017-02-08 (×5): qty 3
  Filled 2017-02-08 (×3): qty 5
  Filled 2017-02-08: qty 3
  Filled 2017-02-08: qty 5
  Filled 2017-02-08: qty 2

## 2017-02-08 MED ORDER — INSULIN ASPART 100 UNIT/ML ~~LOC~~ SOLN: 0.0000 [IU] | Freq: Every day | SUBCUTANEOUS | Status: DC

## 2017-02-08 NOTE — Progress Notes (Signed)
2 Days Post-Op  Subjective: Patient feels well with minimal pain tolerating a full liquid diet with good ostomy output  Objective: Vital signs in last 24 hours: Temp:  [98 F (36.7 C)-100 F (37.8 C)] 98 F (36.7 C) (04/01 0458) Pulse Rate:  [68-88] 68 (04/01 0458) Resp:  [16-24] 17 (04/01 0800) BP: (133-139)/(67-72) 133/67 (04/01 0458) SpO2:  [92 %-98 %] 95 % (04/01 0800) FiO2 (%):  [92 %-93 %] 93 % (03/31 1630) Last BM Date: 01/29/17  Intake/Output from previous day: 03/31 0701 - 04/01 0700 In: 2672.9 [P.O.:1740; I.V.:932.9] Out: 1840 [Urine:640; Stool:1200] Intake/Output this shift: Total I/O In: 581 [I.V.:581] Out: 125 [Urine:125]  Physical exam:  Remains distended and tympanitic but there is good ostomy output. Wound is clean  Lab Results: CBC   Recent Labs  02/07/17 0351 02/08/17 0348  WBC 5.0 5.9  HGB 12.9* 12.2*  HCT 38.3* 36.3*  PLT 258 227   BMET  Recent Labs  02/07/17 0351 02/08/17 0348  NA 139 137  K 3.4* 3.7  CL 109 106  CO2 22 25  GLUCOSE 157* 194*  BUN 24* 15  CREATININE 1.08 0.86  CALCIUM 7.9* 8.1*   PT/INR No results for input(s): LABPROT, INR in the last 72 hours. ABG No results for input(s): PHART, HCO3 in the last 72 hours.  Invalid input(s): PCO2, PO2  Studies/Results: No results found.  Anti-infectives: Anti-infectives    Start     Dose/Rate Route Frequency Ordered Stop   02/06/17 1939  ertapenem [facility]) 1 g in sodium chloride 0.9 % 50 mL IVPB     1 g 100 mL/hr over 30 Minutes Intravenous 30 min pre-op 02/06/17 1939 02/06/17 2038      Assessment/Plan: s/p Procedure(s): COLECTOMY WITH COLOSTOMY CREATION/HARTMANN PROCEDURE   Patient on PCA and advancing diet. Will order oral pain medications to be substituted for PCA bili PCA in place today. May advance diet later today.  [name], MD, FACS  02/08/2017

## 2017-02-08 NOTE — Progress Notes (Signed)
Okay Per Dr. Burt Knack for Pt fingerstick order to be ACHS as he is now on clear liquids.

## 2017-02-09 ENCOUNTER — Encounter: Payer: Self-pay | Admitting: General Surgery

## 2017-02-09 LAB — GLUCOSE, CAPILLARY
GLUCOSE-CAPILLARY: 201 mg/dL — AB (ref 65–99)
Glucose-Capillary: 164 mg/dL — ABNORMAL HIGH (ref 65–99)
Glucose-Capillary: 163 mg/dL — ABNORMAL HIGH (ref 65–99)
Glucose-Capillary: 178 mg/dL — ABNORMAL HIGH (ref 65–99)

## 2017-02-09 MED ORDER — ACETAMINOPHEN 500 MG PO TABS: 1000.0000 mg | ORAL_TABLET | Freq: Four times a day (QID) | ORAL | Status: DC | PRN

## 2017-02-09 MED ORDER — HYDROMORPHONE HCL 1 MG/ML IJ SOLN
0.5000 mg | INTRAMUSCULAR | Status: DC | PRN
  Administered 2017-02-09: 0.5 mg via INTRAVENOUS
  Filled 2017-02-09: qty 0.5

## 2017-02-09 MED ORDER — METFORMIN HCL 500 MG PO TABS
1000.0000 mg | ORAL_TABLET | Freq: Two times a day (BID) | ORAL | Status: DC
  Administered 2017-02-09 – 2017-02-11 (×2): 1000 mg via ORAL
  Filled 2017-02-09 (×2): qty 2

## 2017-02-09 MED ORDER — OXYCODONE HCL 5 MG PO TABS
5.0000 mg | ORAL_TABLET | ORAL | Status: DC | PRN
  Administered 2017-02-09: 5 mg via ORAL
  Administered 2017-02-10: 10 mg via ORAL
  Administered 2017-02-10: 5 mg via ORAL
  Filled 2017-02-09: qty 2
  Filled 2017-02-09 (×2): qty 1

## 2017-02-09 NOTE — Progress Notes (Signed)
While rounding the unit, CH made a follow-up visit with the Pt. Pt was lying in the bed at the time of this visit. 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 [place] encouraged Pt and provided a ministry of presence. When [facility] was about to exit the Rm, Pt's son arrived. [place] informed Pt that [facility] services are available if needed.     02/09/17 1600  Clinical Encounter Type  Visited With Patient;Family  Visit Type Follow-up;Spiritual support  Referral From Chaplain  Consult/Referral To Chaplain  Spiritual Encounters  Spiritual Needs Prayer;Emotional

## 2017-02-09 NOTE — Progress Notes (Signed)
02/09/2017  Subjective: Patient is 3 Days Post-Op s/p exploratory laparotomy with Hartmann's procedure for obstructing left colon mass.  No acute events.  Pain better controlled.  Tolerating full liquids.  Good ostomy function.  Vital signs: Temp:  [97.9 F (36.6 C)-98.4 F (36.9 C)] 98.2 F (36.8 C) (04/02 0453) Pulse Rate:  [66-79] 78 (04/02 0453) Resp:  [14-20] 20 (04/02 0453) BP: (133-151)/(58-69) 151/64 (04/02 0453) SpO2:  [91 %-97 %] 91 % (04/02 0453) FiO2 (%):  [92 %-94 %] 93 % (04/02 0431)   Intake/Output: 04/01 0701 - 04/02 0700 In: 1541.5 [P.O.:960; I.V.:581.5] Out: 975 [Urine:725; Stool:250] Last BM Date: 02/08/17  Physical Exam: Constitutional: No acute distress Abdomen:  Soft, nondistended, appropriately tender to palpation.  Incision is clean, dry, intact.  Ostomy viable and pink with stool in bag.  Labs:   Recent Labs  02/07/17 0351 02/08/17 0348  WBC 5.0 5.9  HGB 12.9* 12.2*  HCT 38.3* 36.3*  PLT 258 227    Recent Labs  02/07/17 0351 02/08/17 0348  NA 139 137  K 3.4* 3.7  CL 109 106  CO2 22 25  GLUCOSE 157* 194*  BUN 24* 15  CREATININE 1.08 0.86  CALCIUM 7.9* 8.1*   No results for input(s): LABPROT, INR in the last 72 hours.  Imaging: No results found.  Assessment/Plan: 76 yo male s/p Hartmann's procedure.  --will discontinue PCA and start oral pain medications --discontinue IVF as patient tolerating full liquids --Advance diet to carb modified.  Slowly re-introduce diabetic meds. --OOB, ambulate.  PT consult --Ostomy nurse consult for teaching.   Melvyn Neth, [place]

## 2017-02-09 NOTE — Evaluation (Signed)
Physical Therapy Evaluation Patient Details Name: Miguel Flores MRN: [id number] DOB: [birth year] Today's Date: 02/09/2017   History of Present Illness  Pt is a 76 y.o. male presenting to hospital with abdominal pain and constipation.  CT suggesting obstruction in proximal sigmoid with apple core lesion.  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  PMH includes DM, htn, HLD, basal cell carcinoma of nose.  Clinical Impression  Prior to hospital admission, pt was independent.  Pt lives alone in 1 level home with ramp to enter.  Currently pt is SBA supine to sit and CGA with transfers and ambulation around nursing loop.  Pt initially requesting to use RW for support/balance but able to progress pt to ambulating without an AD.  Pt did demonstrate impaired balance with dynamic gait activities (ex: head turns while ambulating without AD).  Pt would benefit from skilled PT to address noted impairments and functional limitations.  Recommend pt discharge to home with HHPT (for higher level balance activities required within the home) and support of family when medically appropriate.    Follow Up Recommendations Home health PT    Equipment Recommendations  None recommended by PT    Recommendations for Other Services       Precautions / Restrictions Precautions Precautions: Fall Precaution Comments: Abdominal binder; LLQ colostomy Restrictions Weight Bearing Restrictions: No      Mobility  Bed Mobility Overal bed mobility: Needs Assistance Bed Mobility: Supine to Sit     Supine to sit: Supervision;HOB elevated     General bed mobility comments: logrolling to R; increased effort to perform  Transfers Overall transfer level: Needs assistance Equipment used: Rolling walker (2 wheeled);None Transfers: Sit to/from Stand Sit to Stand: Min guard         General transfer comment: steady with and without  AD  Ambulation/Gait Ambulation/Gait assistance: Min guard Ambulation Distance (Feet):  (80 feet with RW; 40 feet hand hold assit; 120 feet no AD) Assistive device: Rolling walker (2 wheeled);1 person hand held assist;None   Gait velocity: mildly decreased with no AD   General Gait Details: pt steady with use of RW so switched to hand hold assist and then switched to no AD; step through gait pattern  Stairs            Wheelchair Mobility    Modified Rankin (Stroke Patients Only)       Balance Overall balance assessment: Needs assistance Sitting-balance support: No upper extremity supported;Feet supported Sitting balance-Leahy Scale: Normal     Standing balance support: No upper extremity supported;During functional activity Standing balance-Leahy Scale: Good Standing balance comment: mild loss of balance laterally with head turns R and L with ambulation requiring CGA to min assist to steady                             Pertinent Vitals/Pain Pain Assessment: No/denies pain  Vitals (HR and O2 on room air) stable and WFL throughout treatment session.    Home Living Family/patient expects to be discharged to:: Private residence Living Arrangements: Alone Available Help at Discharge: Family Type of Home: House Home Access: [place]: One [place]: Shower seat;Grab bars - tub/shower;Grab bars - toilet;Walker - 2 wheels      Prior Function Level of Independence: Independent         Comments: Pt denies any falls in past 6 months.  Hand Dominance        Extremity/Trunk Assessment   Upper Extremity Assessment Upper Extremity Assessment: Generalized weakness    Lower Extremity Assessment Lower Extremity Assessment: Generalized weakness    Cervical / Trunk Assessment Cervical / Trunk Assessment: Normal  Communication   Communication: No difficulties  Cognition Arousal/Alertness: Awake/alert Behavior  During Therapy: WFL for tasks assessed/performed Overall Cognitive Status: Within Functional Limits for tasks assessed                                        General Comments General comments (skin integrity, edema, etc.): Pt's son present during session.  Ostomy leak noted beginning of session superior aspect (nursing notified and came to change ostomy (wound care nurse came during session and finished changing it); very small opening noted superior aspect of ostomy system end of session (nursing notified)    Exercises  Ambulation; balance   Assessment/Plan    PT Assessment Patient needs continued PT services  PT Problem List Decreased balance;Decreased strength       PT Treatment Interventions Gait training;Functional mobility training;Therapeutic activities;Therapeutic exercise;Balance training;Patient/family education    PT Goals (Current goals can be found in the Care Plan section)  Acute Rehab PT Goals Patient Stated Goal: to go home PT Goal Formulation: With patient Time For Goal Achievement: 02/23/17 Potential to Achieve Goals: Good    Frequency Min 2X/week   Barriers to discharge        Co-evaluation               End of Session Equipment Utilized During Treatment: Gait belt (placed up high away from ostomy) Activity Tolerance: Patient tolerated treatment well Patient left: in chair;with call bell/phone within reach;with chair alarm set;with family/visitor present Nurse Communication: Mobility status;Precautions (Ostomy leak) PT Visit Diagnosis: Unsteadiness on feet (R26.81);Muscle weakness (generalized) (M62.81)    Time: 7737-3668 PT Time Calculation (min) (ACUTE ONLY): 40 min   Charges:   PT Evaluation $PT Eval Low Complexity: 1 Procedure PT Treatments $Therapeutic Exercise: 8-22 mins   PT G CodesLeitha Bleak, PT 02/09/17, 5:16 PM [phone]

## 2017-02-09 NOTE — Consult Note (Signed)
[facility] Nurse ostomy consult note Stoma type/location: LLQ Colostomy  New consult today.  Son is at bedside. Pouch is leaking and changed emergently at this time.  Appointment arranged for tomorrow at 9 AM for pouch change and teaching.  Worked on pouch emptying technique and discussing overall care today.  Stomal assessment/size: 1 3/4 " edematous.  Pink patent and producing brown stool.  Peristomal assessment: Intact  Midline surgical incision present Treatment options for stomal/peristomal skin: Barrier ring to promote seal.  RN reports pouch has leaked a few times.  Abdomen is edematous and firm.  Output soft brown stool Ostomy pouching: 2pc. Will switch to 2 3/4" pouch due to stomal size.   Education provided: POuch change performed.  Discussed changing twice weekly.  Emptying when 1/3 full and showering.  Appt made for anotehr session with son tomorrow at 55 AM.  Enrolled patient in [place]  [place] team will follow and remain available to patient, medical and nursing teams.  Domenic Moras RN BSN [place] Pager [phone]

## 2017-02-09 NOTE — Progress Notes (Signed)
Inpatient Diabetes Program Recommendations  AACE/ADA: New Consensus Statement on Inpatient Glycemic Control (2015)  Target Ranges:  Prepandial:   less than 140 mg/dL      Peak postprandial:   less than 180 mg/dL (1-2 hours)      Critically ill patients:  140 - 180 mg/dL   Results for TYRAY, PROCH (MRN [id number]) as of 02/09/2017 12:57  Ref. Range 02/08/2017 08:07 02/08/2017 12:02 02/08/2017 16:55 02/08/2017 19:59  Glucose-Capillary Latest Ref Range: 65 - 99 mg/dL 164 (H) 188 (H) 231 (H) 200 (H)   Results for BROXTON, BROADY (MRN [id number]) as of 02/09/2017 12:57  Ref. Range 02/09/2017 07:49 02/09/2017 11:33  Glucose-Capillary Latest Ref Range: 65 - 99 mg/dL 164 (H) 201 (H)    Home DM Meds: Amaryl 2 mg daily        Metformin 1000 mg AM/ 1500 mg PM  Current Orders: Novolog Moderate Correction Scale/ SSI (0-15 units) TID AC + HS      Metformin 1000 BID       MD- Please consider starting low dose Novolog Meal Coverage while pt's PO home DM meds (Amaryl) are on hold:  Novolog 3 units TID with meals (hold if pt eats <50% of meal)     --Will follow patient during hospitalization--  Wyn Quaker RN, MSN, CDE Diabetes Coordinator Inpatient Glycemic Control Team Team Pager: [phone] (8a-5p)

## 2017-02-10 ENCOUNTER — Other Ambulatory Visit: Payer: Self-pay | Admitting: Pathology

## 2017-02-10 LAB — GLUCOSE, CAPILLARY
GLUCOSE-CAPILLARY: 182 mg/dL — AB (ref 65–99)
Glucose-Capillary: 200 mg/dL — ABNORMAL HIGH (ref 65–99)
Glucose-Capillary: 218 mg/dL — ABNORMAL HIGH (ref 65–99)
Glucose-Capillary: 137 mg/dL — ABNORMAL HIGH (ref 65–99)

## 2017-02-10 LAB — SURGICAL PATHOLOGY

## 2017-02-10 NOTE — Progress Notes (Signed)
Patient ID: Miguel Flores, male   DOB: 1940/12/16, 76 y.o.   MRN: [id number] Pt states he is feeling much better. No n/v. Tolerating diet. AVSS. Abdomen is soft, still mildly distended, bowel sounds hypoactive. Incision intact and clean Colostomy functioning well. Making progress. Path pending

## 2017-02-10 NOTE — Progress Notes (Signed)
Per Dr. Jamal Collin okay to hold metformin as unclear if pt will be able to eat much as he feels bloated

## 2017-02-10 NOTE — Consult Note (Signed)
[facility] Nurse ostomy follow up Stoma type/location: LLQ Colostomy .  Son and daughter in law are  at bedside. POuch change completed.  Discussed emptying when 1/3 full to maintain seal.  That pouch changes will likely be twice weekly.    Worked on pouch emptying technique and discussing overall care today.  Stomal assessment/size: 1 3/4 " edematous.  Pink patent and producing brown stool.  Peristomal assessment: Intact  Midline surgical incision present Treatment options for stomal/peristomal skin: Barrier ring to promote seal.  RN reports pouch has leaked a few times.  Abdomen is edematous and firm.  Output soft brown stool Ostomy pouching: 2pc. 2 3/4" pouch due to stomal size.   Education provided: POuch change performed.  Discussed changing twice weekly.  Emptying when 1/3 full and showering.  Enrolled patient in [place] program NO  [place] team will follow and remain available to patient, medical and nursing teams.    Domenic Moras RN BSN [place] Pager [phone]

## 2017-02-11 LAB — GLUCOSE, CAPILLARY
GLUCOSE-CAPILLARY: 205 mg/dL — AB (ref 65–99)
Glucose-Capillary: 163 mg/dL — ABNORMAL HIGH (ref 65–99)

## 2017-02-11 MED ORDER — OXYCODONE HCL 5 MG PO TABS: 5.0000 mg | ORAL_TABLET | ORAL | 0 refills | Status: DC | PRN

## 2017-02-11 NOTE — Care Management (Signed)
[facility] has accepted referral for home health PT and RN.  RNCM signing off

## 2017-02-11 NOTE — Progress Notes (Signed)
Chaplain made a follow-up visit with the Pt. Pt was lying on the bed at the time of this visit. Pt's son was bedside. 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 Pt to get other test results in 2 weeks. Pt was in great spirit; he talked about his church, his pastors, church choirs that he is part of, and was looking forward to be discharged today. [place] provided support and ministry of presence.    02/11/17 1500  Clinical Encounter Type  Visited With Patient;Family  Visit Type Follow-up  Referral From Chaplain  Consult/Referral To Chaplain  Spiritual Encounters  Spiritual Needs Other (Comment)

## 2017-02-11 NOTE — Discharge Instructions (Signed)
Colostomy, Adult, Care After Refer to this sheet in the next few weeks. These instructions provide you with information about caring for yourself after your procedure. Your health care provider may also give you more specific instructions. Your treatment has been planned according to current medical practices, but problems sometimes occur. Call your health care provider if you have any problems or questions after your procedure. What can I expect after the procedure? After the procedure, it is common to have:  Swelling at the opening that was created during the procedure (stoma).  Slight bleeding around the stoma.  Redness around the stoma. Follow these instructions at home: Activity   Rest as needed while the stoma area heals.  Return to your normal activities as told by your health care provider. Ask your health care provider what activities are safe for you.  Avoid strenuous activity and abdominal exercises for 3 weeks or for as long as told by your health care provider.  Do not lift anything that is heavier than 10 lb (4.5 kg). Incision care    Follow instructions from your health care provider about how to take care of your incision. Make sure you:  Wash your hands with soap and water before you change your bandage (dressing). If soap and water are not available, use hand sanitizer.  Change your dressing as told by your health care provider.  Leave stitches (sutures), skin glue, or adhesive strips in place. These skin closures may need to stay in place for 2 weeks or longer. If adhesive strip edges start to loosen and curl up, you may trim the loose edges. Do not remove adhesive strips completely unless your health care provider tells you to do that. Stoma Care   Keep the stoma area clean.  Clean and dry the skin around the stoma each time you change the colostomy bag. To clean the stoma area:  Use warm water and only use cleansers that are recommended by your health care  provider.  Rinse the stoma area with plain water.  Dry the area well.  Use stoma powder or ointment on your skin only as told by your health care provider. Do not use any other powders, gels, wipes, or creams on your skin.  Check the stoma area every day for signs of infection. Check for:  More redness, swelling, or pain.  More fluid or blood.  Pus or warmth.  Measure the stoma opening regularly and record the size. Watch for changes. Share this information with your health care provider. Bathing   Do not take baths, swim, or use a hot tub until your health care provider approves. Ask your health care provider if you can take showers. You may be able to shower with or without the colostomy bag in place. If you bathe with the bag on, dry the bag afterward.  Avoid using harsh or oily soaps when you bathe. Colostomy Bag Care   Follow instructions from your health care provider about how to empty or change the colostomy bag.  Keep colostomy supplies with you at all times.  Store all supplies in a cool, dry place.  Empty the colostomy bag:  Whenever it is one-third to one-half full.  At bedtime.  Replace the bag every 2-4 days or as told by your health care provider. Driving   Do not drive for 24 hours if you received a sedative.  Do not drive or operate heavy machinery while taking prescription pain medicine. General instructions   Follow instructions from  your health care provider about eating or drinking restrictions.  Take over-the-counter and prescription medicines only as told by your health care provider.  Avoid wearing clothes that are tight directly over your stoma.  Do not use any tobacco products, such as cigarettes, chewing tobacco, and e-cigarettes. If you need help quitting, ask your health care provider.  (Women) Ask your health care provider about becoming pregnant and about using birth control. Medicines may not be absorbed normally after the  procedure.  Keep all follow-up visits as told by your health care provider. This is important. Contact a health care provider if:  You are having trouble caring for your stoma or changing the colostomy bag.  You feel nauseous or you vomit.  You have a fever.  You havemore redness, swelling, or pain at the site of your stoma or around your anus.  You have more fluid or blood coming from your stoma or your anus.  Your stoma area feels warm to the touch.  You have pus coming from your stoma.  You notice a change in the size or appearance of the stoma.  You have abdominal pain, bloating, pressure, or cramping.  Your have stool more often or less often than your health care provider tells you to expect.  You are not making much urine. This may be a sign of dehydration. Get help right away if:  Your abdominal pain does not go away or it becomes severe.  You keep vomiting.  Your stool is not draining through the stoma.  You have chest pain or an irregular heartbeat. This information is not intended to replace advice given to you by your health care provider. Make sure you discuss any questions you have with your health care provider. Document Released: 03/19/2011 Document Revised: 03/06/2016 Document Reviewed: 07/10/2015 Elsevier Interactive Patient Education  2017 [name].

## 2017-02-11 NOTE — Care Management (Signed)
Patient s/p exploratory laparotomy with Hartmann's procedure for obstructing left colon mass.  Patient lives at home alone.  Son lives next door for support and daughter in law is a [employer] scientist.  Patient has a new colostomy.  Has been educated by our [facility] nurse, and enrolled in the [employer] program.  Patient to discharge with home health RN and PT.  Patient was provided home health agency preference.  Patient states that he does not have an agency preference.  Awaiting return call from agencies to determine who can accept [facility] at this time.

## 2017-02-11 NOTE — Progress Notes (Signed)
qPhysical Therapy Treatment Patient Details Name: Miguel Flores MRN: [id number] DOB: 10/21/1941 Today's Date: 02/11/2017    History of Present Illness Pt is a 76 y.o. male presenting to hospital with abdominal pain and constipation.  CT suggesting obstruction in proximal sigmoid with apple core lesion.  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  PMH includes DM, htn, HLD, basal cell carcinoma of nose.    PT Comments    Pt agreeable to PT; pt denies pain. Pt states he will be going home later today; no active discharge order noted at this time. Pt wishes to walk. Pt demonstrates bed mobility and transfers with Mod I; ambulation of 390 ft with supervision. Pt reports feeling close to baseline with ambulation, but mildly weaker. Discussed using rolling walker for optimal safety at all times. Continue PT to progress strength if pt continues admitted; otherwise pt will discharge home with recommended home health PT to improve all functional mobility to prior level of function.   Follow Up Recommendations  Home health PT     Equipment Recommendations  None recommended by PT    Recommendations for Other Services       Precautions / Restrictions Precautions Precautions: Fall Restrictions Weight Bearing Restrictions: No    Mobility  Bed Mobility Overal bed mobility: Modified Independent       Supine to sit: Modified independent (Device/Increase time)     General bed mobility comments: Mild increased time  Transfers Overall transfer level: Modified independent Equipment used: Rolling walker (2 wheeled);None Transfers: Sit to/from Stand Sit to Stand: Modified independent (Device/Increase time)         General transfer comment: Safe, good use of hands. No LOB or adverse symptoms  Ambulation/Gait Ambulation/Gait assistance: Supervision Ambulation Distance (Feet): 390 Feet Assistive device: Rolling walker (2  wheeled);1 person hand held assist;None Gait Pattern/deviations: WFL(Within Functional Limits)     General Gait Details: Pt notes about back to baseline with ambulation; feels mildly weak. No LOB. Good cadence/safety   Stairs            Wheelchair Mobility    Modified Rankin (Stroke Patients Only)       Balance Overall balance assessment: Modified Independent                                          Cognition Arousal/Alertness: Awake/alert Behavior During Therapy: WFL for tasks assessed/performed Overall Cognitive Status: Within Functional Limits for tasks assessed                                        Exercises      General Comments        Pertinent Vitals/Pain Pain Assessment: No/denies pain    Home Living                      Prior Function            PT Goals (current goals can now be found in the care plan section) Progress towards PT goals: Progressing toward goals    Frequency    Min 2X/week      PT Plan Current plan remains appropriate    Co-evaluation             End  of Session Equipment Utilized During Treatment: Gait belt Activity Tolerance: Patient tolerated treatment well Patient left: in bed;with call bell/phone within reach;Other (comment) (refuses alarm)   PT Visit Diagnosis: Unsteadiness on feet (R26.81);Muscle weakness (generalized) (M62.81)     Time: 3300-7622 PT Time Calculation (min) (ACUTE ONLY): 25 min  Charges:  $Gait Training: 23-37 mins                    G Codes:        Larae Grooms, PTA 02/11/2017, 11:34 AM

## 2017-02-11 NOTE — Progress Notes (Signed)
Patient discharged to home as ordered. Patient emptied his colostomy bag with nurse observing and patient performed emptying the bag well, Patient given 3 extra colostomy bags and a wafer upon discharge. Patient son at the bedside to take patient home. Patient daughter in law who is a nurse will help patient when he goes home to manage the colostomy. Patient denies pain at this time no acute distress noted.

## 2017-02-11 NOTE — Discharge Summary (Signed)
Patient ID: Miguel Flores MRN: [id number] DOB/AGE: 77/10/1941 77 y.o.  Admit date: 02/05/2017 Discharge date: 02/11/2017  Discharge Diagnoses:  Colon Cancer  Procedures Performed: Sigmoid Colectomy with end colostomy creation  Discharged Condition: good  Hospital Course: Patient taken to the OR for an urgent colon resection. Tolerated the procedure well. On the day of discharge he was tolerating a regular diet, pain was controlled with oral medications and he was having good ostomy output. His abdomen is soft, minimally tender and his midline incision was well approximated with sutured without any evidence of drainage or infection.  Discharge Orders: Discharge Instructions    Call MD for:  persistant nausea and vomiting    Complete by:  As directed    Call MD for:  redness, tenderness, or signs of infection (pain, swelling, redness, odor or green/yellow discharge around incision site)    Complete by:  As directed    Call MD for:  severe uncontrolled pain    Complete by:  As directed    Call MD for:  temperature >100.4    Complete by:  As directed    Diet - low sodium heart healthy    Complete by:  As directed    Increase activity slowly    Complete by:  As directed       Disposition: Final discharge disposition not confirmed  Discharge Medications: Allergies as of 02/11/2017   No Known Allergies     Medication List    TAKE these medications   DOCU PO Take 1 capsule by mouth 2 (two) times daily.   ferrous sulfate 324 (65 Fe) MG Tbec Take 324 mg by mouth daily.   glimepiride 2 MG tablet Commonly known as:  AMARYL Take 2 mg by mouth daily.   Glucosamine-Chondroitin 500-400 MG Caps Take 1 tablet by mouth 2 (two) times daily.   loratadine 10 MG tablet Commonly known as:  CLARITIN Take 10 mg by mouth daily.   losartan 25 MG tablet Commonly known as:  COZAAR Take 25 mg by mouth daily.   metFORMIN 1000 MG tablet Commonly known as:  GLUCOPHAGE Take 1,000-1,500  tablets by mouth 2 (two) times daily. Take 1 tablet in AM and 1 1/2 tablets at supper   oxyCODONE 5 MG immediate release tablet Commonly known as:  Oxy IR/ROXICODONE Take 1-2 tablets (5-10 mg total) by mouth every 4 (four) hours as needed for moderate pain or severe pain.   polyethylene glycol powder powder Commonly known as:  GLYCOLAX/MIRALAX Take 17 g by mouth 2 (two) times daily.   pravastatin 20 MG tablet Commonly known as:  PRAVACHOL Take 20 mg by mouth daily.   RECTACORT-HC 25 MG suppository Generic drug:  hydrocortisone Place 1 suppository rectally 2 (two) times daily as needed.        Follwup: Follow-up Information    [name], MD On 02/20/2017.   Specialty:  Family Medicine Why:  @ 10:45 am.  Contact information: [facility] [place] [address] [phone]        Phoebe Perch, MD Follow up in 2 week(s).   Specialty:  Surgery Why:  post-op Contact information: [place] [address] [phone]           Signed: Clayburn Pert 02/11/2017, 1:36 PM

## 2017-02-11 NOTE — Care Management Important Message (Signed)
Important Message  Patient Details  Name: Miguel Flores MRN: [id number] Date of Birth: [birth year]   Medicare Important Message Given:  Yes    Beverly Sessions, RN 02/11/2017, 4:05 PM

## 2017-02-12 NOTE — Anesthesia Postprocedure Evaluation (Signed)
Anesthesia Post Note  Patient: Miguel Flores  Procedure(s) Performed: Procedure(s) (LRB): COLECTOMY WITH COLOSTOMY CREATION/HARTMANN PROCEDURE (N/A)  Patient location during evaluation: PACU Anesthesia Type: General Level of consciousness: awake and alert Pain management: pain level controlled Vital Signs Assessment: post-procedure vital signs reviewed and stable Respiratory status: spontaneous breathing, nonlabored ventilation, respiratory function stable and patient connected to nasal cannula oxygen Cardiovascular status: blood pressure returned to baseline and stable Postop Assessment: no signs of nausea or vomiting Anesthetic complications: no     Last Vitals:  Vitals:   02/11/17 1231 02/11/17 1415  BP: 127/71   Pulse: 78   Resp:  16  Temp: 36.7 C     Last Pain:  Vitals:   02/11/17 1231  TempSrc: Oral  PainSc:                  Molli Barrows

## 2017-02-12 NOTE — Anesthesia Postprocedure Evaluation (Signed)
Anesthesia Post Note  Patient: LASARO PRIMM  Procedure(s) Performed: Procedure(s) (LRB): FLEXIBLE SIGMOIDOSCOPY (N/A)  Patient location during evaluation: PACU Anesthesia Type: MAC Level of consciousness: awake and alert Pain management: pain level controlled Vital Signs Assessment: post-procedure vital signs reviewed and stable Respiratory status: spontaneous breathing, nonlabored ventilation, respiratory function stable and patient connected to nasal cannula oxygen Cardiovascular status: blood pressure returned to baseline and stable Postop Assessment: no signs of nausea or vomiting Anesthetic complications: no     Last Vitals:  Vitals:   02/11/17 1231 02/11/17 1415  BP: 127/71   Pulse: 78   Resp:  16  Temp: 36.7 C     Last Pain:  Vitals:   02/11/17 1231  TempSrc: Oral  PainSc:                  Molli Barrows

## 2017-02-20 DIAGNOSIS — E119 Type 2 diabetes mellitus without complications: Secondary | ICD-10-CM | POA: Insufficient documentation

## 2017-02-20 DIAGNOSIS — E782 Mixed hyperlipidemia: Secondary | ICD-10-CM | POA: Insufficient documentation

## 2017-02-20 DIAGNOSIS — E1169 Type 2 diabetes mellitus with other specified complication: Secondary | ICD-10-CM | POA: Insufficient documentation

## 2017-02-20 DIAGNOSIS — I10 Essential (primary) hypertension: Secondary | ICD-10-CM | POA: Insufficient documentation

## 2017-02-20 DIAGNOSIS — C189 Malignant neoplasm of colon, unspecified: Secondary | ICD-10-CM | POA: Insufficient documentation

## 2017-02-24 ENCOUNTER — Telehealth: Payer: Self-pay | Admitting: Surgery

## 2017-02-24 NOTE — Telephone Encounter (Signed)
Spoke with patient at this time. She stated she did not call, that her son possibly may have called.  I let her know that we do not discuss Pathology results over the phone and that Dr.Cooper is not in the office today. Patient has scheduled appointment 02/25/17 with DR.Cooper. Patient stated she is fine with waiting until her appointment to discuss pathology results.   Please note -Patients son is not listed for [place] to provide information to.

## 2017-02-24 NOTE — Telephone Encounter (Signed)
Would like to discuss biopsy results - Please call

## 2017-02-25 ENCOUNTER — Ambulatory Visit (INDEPENDENT_AMBULATORY_CARE_PROVIDER_SITE_OTHER): Payer: Medicare Other | Admitting: Surgery

## 2017-02-25 ENCOUNTER — Telehealth: Payer: Self-pay

## 2017-02-25 ENCOUNTER — Encounter: Payer: Self-pay | Admitting: Surgery

## 2017-02-25 VITALS — BP 156/76 | HR 82 | Temp 97.7°F | Wt 173.0 lb

## 2017-02-25 DIAGNOSIS — C186 Malignant neoplasm of descending colon: Secondary | ICD-10-CM

## 2017-02-25 NOTE — Patient Instructions (Addendum)
We will refer you to see an Oncologist (Dr. [name]). They will contact you to schedule your appointment. If you haven't received a call by the end of the week, please give me a call.  We will see you back in 4 weeks.  At this time you are able to drive and sing in the choir.

## 2017-02-25 NOTE — Progress Notes (Signed)
Outpatient postop visit  02/25/2017  Miguel Flores is an 76 y.o. male.    Procedure: Colon resection with colostomy  CC: Colostomy  HPI: This patient underwent a colon resection with colostomy for obstructing colon cancer. He had 0 of 15 lymph nodes positive.  Patient states he is eating better and not yet gaining weight but having no problems with his colostomy.  Medications reviewed.    Physical Exam:  There were no vitals taken for this visit.    PE: Abdomen is distended nontympanitic and nontender there is no erythema no drainage staples are still present and removed today ostomy is functional calves are nontender    Assessment/Plan:  Patient doing very well pathology was reviewed 0 15 lymph nodes and a rather large obstructing tumor. Last him to see oncology and follow-up with me in 4 weeks  [name], MD, FACS

## 2017-02-25 NOTE — Telephone Encounter (Signed)
I have put in an internal referral to Oncology to see Dr. [name].   I will follow up within 3-5 days to make sure the appointments have been scheduled.

## 2017-02-25 NOTE — Telephone Encounter (Signed)
Appointment has been scheduled for April 30 at 11:00 in Miguel Flores with Dr. [name]   I have called the patient and left him an appointment with the appointment information.  Address: [address] [place], [place] [address] Phone: [phone]

## 2017-02-26 ENCOUNTER — Telehealth: Payer: Self-pay | Admitting: General Practice

## 2017-02-26 NOTE — Telephone Encounter (Signed)
Called patient back to let him know that he has a lifting restriction of 20 lbs until Mar 20, 2017. Patient understood and had no further questions.

## 2017-02-26 NOTE — Telephone Encounter (Signed)
Patient called asking what his weight restrictions on picking things up. He had surgery on 02/06/17. He had a colectomy with colostomy creation/hartmann procedure. Please call patient and advice.

## 2017-03-09 ENCOUNTER — Inpatient Hospital Stay: Payer: Medicare Other

## 2017-03-09 ENCOUNTER — Encounter: Payer: Self-pay | Admitting: Oncology

## 2017-03-09 ENCOUNTER — Inpatient Hospital Stay: Payer: Medicare Other | Attending: Oncology | Admitting: Oncology

## 2017-03-09 VITALS — BP 147/76 | HR 76 | Temp 96.6°F | Resp 18 | Ht 67.32 in | Wt 173.5 lb

## 2017-03-09 DIAGNOSIS — C775 Secondary and unspecified malignant neoplasm of intrapelvic lymph nodes: Secondary | ICD-10-CM | POA: Diagnosis not present

## 2017-03-09 DIAGNOSIS — R5381 Other malaise: Secondary | ICD-10-CM | POA: Diagnosis not present

## 2017-03-09 DIAGNOSIS — C187 Malignant neoplasm of sigmoid colon: Secondary | ICD-10-CM | POA: Diagnosis not present

## 2017-03-09 DIAGNOSIS — R5383 Other fatigue: Secondary | ICD-10-CM | POA: Insufficient documentation

## 2017-03-09 DIAGNOSIS — Z9049 Acquired absence of other specified parts of digestive tract: Secondary | ICD-10-CM | POA: Diagnosis not present

## 2017-03-09 DIAGNOSIS — E782 Mixed hyperlipidemia: Secondary | ICD-10-CM

## 2017-03-09 DIAGNOSIS — Z79899 Other long term (current) drug therapy: Secondary | ICD-10-CM | POA: Insufficient documentation

## 2017-03-09 DIAGNOSIS — I10 Essential (primary) hypertension: Secondary | ICD-10-CM

## 2017-03-09 DIAGNOSIS — Z7984 Long term (current) use of oral hypoglycemic drugs: Secondary | ICD-10-CM | POA: Diagnosis not present

## 2017-03-09 DIAGNOSIS — Z7189 Other specified counseling: Secondary | ICD-10-CM | POA: Insufficient documentation

## 2017-03-09 DIAGNOSIS — E119 Type 2 diabetes mellitus without complications: Secondary | ICD-10-CM

## 2017-03-09 DIAGNOSIS — Z7185 Encounter for immunization safety counseling: Secondary | ICD-10-CM | POA: Insufficient documentation

## 2017-03-09 DIAGNOSIS — Z933 Colostomy status: Secondary | ICD-10-CM | POA: Insufficient documentation

## 2017-03-09 DIAGNOSIS — C189 Malignant neoplasm of colon, unspecified: Secondary | ICD-10-CM

## 2017-03-09 LAB — COMPREHENSIVE METABOLIC PANEL
ALBUMIN: 4.7 g/dL (ref 3.5–5.0)
ALK PHOS: 65 U/L (ref 38–126)
ALT: 15 U/L — AB (ref 17–63)
AST: 19 U/L (ref 15–41)
Anion gap: 8 (ref 5–15)
BUN: 18 mg/dL (ref 6–20)
CALCIUM: 10 mg/dL (ref 8.9–10.3)
CO2: 24 mmol/L (ref 22–32)
CREATININE: 0.98 mg/dL (ref 0.61–1.24)
Chloride: 105 mmol/L (ref 101–111)
GFR calc non Af Amer: >60 mL/min (ref >60)
GLUCOSE: 73 mg/dL (ref 65–99)
Potassium: 4.7 mmol/L (ref 3.5–5.1)
SODIUM: 137 mmol/L (ref 135–145)
Total Bilirubin: 0.3 mg/dL (ref 0.3–1.2)
Total Protein: 7.9 g/dL (ref 6.5–8.1)

## 2017-03-09 LAB — CBC WITH DIFFERENTIAL/PLATELET
BASOS PCT: 1 %
Basophils Absolute: 0.1 10*3/uL (ref 0–0.1)
EOS ABS: 0.3 10*3/uL (ref 0–0.7)
Eosinophils Relative: 3 %
HCT: 44.2 % (ref 40.0–52.0)
HEMOGLOBIN: 14.5 g/dL (ref 13.0–18.0)
Lymphocytes Relative: 37 %
Lymphs Abs: 3.6 10*3/uL (ref 1.0–3.6)
MCH: 28.4 pg (ref 26.0–34.0)
MCHC: 32.8 g/dL (ref 32.0–36.0)
MCV: 86.8 fL (ref 80.0–100.0)
Monocytes Absolute: 0.7 10*3/uL (ref 0.2–1.0)
Monocytes Relative: 7 %
NEUTROS PCT: 52 %
Neutro Abs: 5.1 10*3/uL (ref 1.4–6.5)
Platelets: 283 10*3/uL (ref 150–440)
RBC: 5.09 MIL/uL (ref 4.40–5.90)
RDW: 14 % (ref 11.5–14.5)
WBC: 9.8 10*3/uL (ref 3.8–10.6)

## 2017-03-09 MED ORDER — PROCHLORPERAZINE MALEATE 10 MG PO TABS: 10.0000 mg | ORAL_TABLET | Freq: Four times a day (QID) | ORAL | 1 refills | Status: DC | PRN

## 2017-03-09 MED ORDER — ONDANSETRON HCL 8 MG PO TABS: 8.0000 mg | ORAL_TABLET | Freq: Two times a day (BID) | ORAL | 1 refills | Status: DC | PRN

## 2017-03-09 MED ORDER — LORAZEPAM 0.5 MG PO TABS: 0.5000 mg | ORAL_TABLET | Freq: Four times a day (QID) | ORAL | 0 refills | Status: DC | PRN

## 2017-03-09 NOTE — Progress Notes (Signed)
Here for new pt evaluation.  

## 2017-03-09 NOTE — Progress Notes (Signed)
Hematology/Oncology Consult note [facility] Telephone:([phone] [phone]  Patient Care Team: [name], MD as PCP - General (Family Medicine)   Name of the patient: Miguel Flores  [id number]  [birth year]    Reason for referral- colon cancer s/p resection   Referring physician- Dr. Burt Knack  Date of visit: 03/09/17   History of presenting illness- 1. Patient is a 76 year old male who presented with evidence of bowel obstruction on 02/06/2017. At that time he had progressive abdominal distention and no bowel movement for about one week. CT abdomen showed an apple core lesion in the descending/sigmoid colon.  2. Patient underwent Hartmann's procedure for obstructing sigmoid colon mass on 02/06/2017. Preoperative CEA was 5.0  3.  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 It is 0 out of 15 lymph nodes were positive for malignancy. Perineural and lymphovascular invasion was present. Margins were negative.pT3N0. MMR stable.  4. He is here to discuss adjuvant treatment options. He lives alone. PMH significant for HTN and diabetes. He is independent of his ADL's and IADL's   ECOG PS- 1  Pain scale- 0   Review of systems- Review of Systems  Constitutional: Positive for malaise/fatigue. Negative for chills, fever and weight loss.  HENT: Negative for congestion, ear discharge and nosebleeds.   Eyes: Negative for blurred vision.  Respiratory: Negative for cough, hemoptysis, sputum production, shortness of breath and wheezing.   Cardiovascular: Negative for chest pain, palpitations, orthopnea and claudication.  Gastrointestinal: Negative for abdominal pain, blood in stool, constipation, diarrhea, heartburn, melena, nausea and vomiting.  Genitourinary: Negative for dysuria, flank pain, frequency, hematuria and urgency.  Musculoskeletal: Negative for back pain,  joint pain and myalgias.  Skin: Negative for rash.  Neurological: Negative for dizziness, tingling, focal weakness, seizures, weakness and headaches.  Endo/Heme/Allergies: Does not bruise/bleed easily.  Psychiatric/Behavioral: Negative for depression and suicidal ideas. The patient does not have insomnia.     No Known Allergies  Patient Active Problem List   Diagnosis Date Noted  . Essential hypertension 02/20/2017  . Mixed hyperlipidemia 02/20/2017  . Type 2 diabetes mellitus without complication, without long-term current use of insulin ([place]) 02/20/2017  . Adenocarcinoma ([place]) 02/20/2017  . Colonic mass   . Large bowel obstruction ([place]) 02/05/2017  . Senile purpura ([place]) 10/07/2016  . Chronic eczema 02/04/2016  . Normocytic anemia 02/04/2016     Past Medical History:  Diagnosis Date  . Cancer ([place])   . Diabetes mellitus without complication ([place])   . Hyperlipidemia   . Hypertension      Past Surgical History:  Procedure Laterality Date  . COLECTOMY WITH COLOSTOMY CREATION/HARTMANN PROCEDURE N/A 02/06/2017   Procedure: COLECTOMY WITH COLOSTOMY CREATION/HARTMANN PROCEDURE;  Surgeon: [name], MD;  Location: [facility] ORS;  Service: General;  Laterality: N/A;  . CYSTOSCOPY    . FLEXIBLE SIGMOIDOSCOPY N/A 02/06/2017   Procedure: FLEXIBLE SIGMOIDOSCOPY;  Surgeon: Christene Lye, MD;  Location: [facility] ENDOSCOPY;  Service: Endoscopy;  Laterality: N/A;    Social History   Social History  . Marital status: Married    Spouse name: N/A  . Number of children: N/A  . Years of education: N/A   Occupational History  . Not on file.   Social History Main Topics  . Smoking status: Never Smoker  . Smokeless tobacco: Never Used  . Alcohol use No  . Drug use: Unknown  . Sexual activity: Not on file   Other  Topics Concern  . Not on file   Social History Narrative  . No narrative on file     Family History  Problem Relation Age of Onset  . Diabetes Mother   .  Diabetes Father      Current Outpatient Prescriptions:  .  Blood Glucose Monitoring Suppl (GLUCOCOM BLOOD GLUCOSE MONITOR) DEVI, 1 Device by Other route every morning., Disp: , Rfl:  .  Docusate Sodium (DOCU PO), Take 1 capsule by mouth 2 (two) times daily., Disp: , Rfl:  .  ferrous sulfate 324 (65 Fe) MG TBEC, Take 324 mg by mouth daily., Disp: , Rfl:  .  glimepiride (AMARYL) 2 MG tablet, Take 2 mg by mouth daily., Disp: , Rfl:  .  Glucosamine-Chondroitin 500-400 MG CAPS, Take 1 tablet by mouth 2 (two) times daily., Disp: , Rfl:  .  loratadine (CLARITIN) 10 MG tablet, Take 10 mg by mouth daily., Disp: , Rfl:  .  losartan (COZAAR) 25 MG tablet, Take 25 mg by mouth daily., Disp: , Rfl:  .  metFORMIN (GLUCOPHAGE) 1000 MG tablet, Take 1,000-1,500 tablets by mouth 2 (two) times daily. Take 1 tablet in AM and 1 1/2 tablets at supper, Disp: , Rfl:  .  pravastatin (PRAVACHOL) 20 MG tablet, Take 20 mg by mouth daily., Disp: , Rfl:    Physical exam:  Vitals:   03/09/17 1044 03/09/17 1045  BP: (!) 147/76 (!) 147/76  Pulse: 76 76  Resp: 18 18  Temp: (!) 96.6 F (35.9 C) (!) 96.6 F (35.9 C)  TempSrc: Tympanic Tympanic  Weight: 173 lb 8 oz (78.7 kg) 173 lb 8 oz (78.7 kg)  Height: 5' 7.32" (1.71 m) 5' 7.32" (1.71 m)   Physical Exam  Constitutional: He is oriented to person, place, and time and well-developed, well-nourished, and in no distress.  HENT:  Head: Normocephalic and atraumatic.  Eyes: EOM are normal. Pupils are equal, round, and reactive to light.  Neck: Normal range of motion.  Cardiovascular: Normal rate, regular rhythm and normal heart sounds.   Pulmonary/Chest: Effort normal and breath sounds normal.  Abdominal: Soft. Bowel sounds are normal.  Colostomy pouch in place functioning well. Surgical scar has healed well. No signs of infection  Neurological: He is alert and oriented to person, place, and time.  Skin: Skin is warm and dry.       CMP Latest Ref Rng & Units  02/08/2017  Glucose 65 - 99 mg/dL 741(O)  BUN 6 - 20 mg/dL 15  Creatinine 8.78 - 6.76 mg/dL 7.20  Sodium 947 - 096 mmol/L 137  Potassium 3.5 - 5.1 mmol/L 3.7  Chloride 101 - 111 mmol/L 106  CO2 22 - 32 mmol/L 25  Calcium 8.9 - 10.3 mg/dL 8.1(L)  Total Protein 6.5 - 8.1 g/dL 6.2(L)  Total Bilirubin 0.3 - 1.2 mg/dL 0.5  Alkaline Phos 38 - 126 U/L 53  AST 15 - 41 U/L 13(L)  ALT 17 - 63 U/L 9(L)   CBC Latest Ref Rng & Units 02/08/2017  WBC 3.8 - 10.6 K/uL 5.9  Hemoglobin 13.0 - 18.0 g/dL 12.2(L)  Hematocrit 40.0 - 52.0 % 36.3(L)  Platelets 150 - 440 K/uL 227     Assessment and plan- Patient is a 76 y.o. 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 I will obtain CT thorax with contrast to complete staging work up. He  has a 56m pulmonary nodule which will need future follow up  Patient has some high risk features despite Stage II colon cancer- he presented with obstruction and has LVI and perineural invasion. I would therefore recommend adjuvant chemotherapy with single agent xeloda at 1000 gm/meter square 2 BID weeks on and 1 week off for 24 weeks if tolerated. I discussed risks and benefits of xeloda including all but not limited to fatigue, nausea, vomiting, risk of renal dysfunction, rash and diarrhea. patitn understands and agrees to proceed. We will work on gGovernment social research officerand I will see him after he completes cycle 1 of xeloda. CBC, CMP and CEA today. I do not recommend adding oxaliplatin given his age, diabetes and risk of neuropathy, T3 not T4 disease and no known survival benefit in Stage II colon cancer. Treatment will be given with curative intent  He is 4 weeks post op and doing well post surgery and he can start xeloda after insurance approval. We will need to coordinate his colostomy takedown with Dr. CBurt Knackin the middle of chemotherapy    Total face to face encounter time for this patient visit was 45 min. >50% of the time was  spent in counseling and coordination of care.     Thank you for this kind referral and the opportunity to participate in the care of this patient   Visit Diagnosis 1. 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   2. Goals of care, counseling/discussion     Dr. ARanda Evens MD, MPH C[facility]embrokeat ASouthern Bone And Joint Asc LLCPager- [phone]/30/2018

## 2017-03-10 ENCOUNTER — Telehealth: Payer: Self-pay

## 2017-03-10 LAB — CEA: CEA: 1.6 ng/mL (ref 0.0–4.7)

## 2017-03-10 NOTE — Telephone Encounter (Signed)
  Oncology Nurse Navigator Documentation Miguel Flores consulted with Miguel Flores 03/09/17 in [place]. Called him today to introduce nurse navigator services and provide him with my contact information for any future support/needs/education. He does have a cancer insurance policy that he will need assistance with once he obtains it. Navigator Location: [facility] (03/10/17 1500)   )Navigator Encounter Type: Telephone;Introductory phone call (03/10/17 1500) Telephone: Outgoing Call (03/10/17 1500)                                                  Time Spent with Patient: 15 (03/10/17 1500)

## 2017-03-11 ENCOUNTER — Telehealth: Payer: Self-pay | Admitting: *Deleted

## 2017-03-11 NOTE — Telephone Encounter (Signed)
Pt called stating that he has appt with dentist tom. And wanted to know if she should go or he should start xeloda and go to dentist. I checked with dr. [name] on her day off and she states that he should hold off starting med. Until he goes to dentis.  When I called pt back he did not know if they would hold his appt for tom. And he has not got medicine yet.  He was asking in case his appt was moved to another day and his med came in before that date of dentist appt.  I told him that we have not heard from cooper yet but would let him know when we do.

## 2017-03-11 NOTE — Telephone Encounter (Signed)
Called back to tell pt that Dr. [name] spoke to Dr. Burt Knack and he states he usually does not even think about take down for 3-4 months so he does not want to do reversal before treatment of chemo or during treatment. He states that you can have wound healing issues because of the chemotherapy side effects.  The patient would need to wait until he has completed the chemo and recuperated from it with side effects as well as blood counts good and then cooper would evaluate the surgery reversal.  He said he must be looking at about six months so he will call the company for supplies and get more to him.

## 2017-03-12 ENCOUNTER — Other Ambulatory Visit: Payer: Self-pay | Admitting: *Deleted

## 2017-03-12 ENCOUNTER — Other Ambulatory Visit: Payer: Self-pay | Admitting: Oncology

## 2017-03-12 MED ORDER — CAPECITABINE 500 MG PO TABS: 1000.0000 mg/m2 | ORAL_TABLET | Freq: Two times a day (BID) | ORAL | 2 refills | Status: DC

## 2017-03-13 ENCOUNTER — Telehealth: Payer: Self-pay | Admitting: General Practice

## 2017-03-13 ENCOUNTER — Telehealth: Payer: Self-pay | Admitting: *Deleted
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
## 2017-03-13 NOTE — Progress Notes (Signed)
When pt was in clinic I spoke to him about xeloda and went over possible side effects are diarrhea, hand and foot syndrome, fatigue, n&v, low blood counts, rash to name a few. All teaching sheets were verbally taught to him. I went over that he should take it with food, eat first and take tablets within 30 min of eating. He should not crush, chew or dissolve the tablets.  If he misses a dose then skip the missed dose and go back to normal time.  Pt understands and had a few questions and they were answered. Will send rx to [facility] when rx and note done to send. Once pt hears about cost and delivery he will contact me.

## 2017-03-13 NOTE — Telephone Encounter (Signed)
Patient called and left a message at 7:34 am this morning He said his stomach towards the center is a little bit touchy, and feels a little different, he is asking if this is normal, this is the first time in five weeks since his surgery, He had surgery done on 02/06/17 he had a  colectomy with colostomy creation/hartmann procedure with Dr. Burt Knack. He said everything is coming out fine, but is concern about his stomach. Please call patient and advice.

## 2017-03-13 NOTE — Telephone Encounter (Signed)
Pt called to say that he spoke to [employer] today and he has a no copay and they will ship med out and it will arrive to him on Monday. I told him to call me when he gets it and he can start it the next day.  When patient starts his drug he will take it for 2 weeks and one week off and while he is on his week off he will need to see [name] and have labs.

## 2017-03-16 ENCOUNTER — Telehealth: Payer: Self-pay | Admitting: *Deleted

## 2017-03-16 NOTE — Telephone Encounter (Signed)
Pt got med xeloda and will start it on wed. Of this week.  He will need to be seen on his week off drug. I will enter an appt for 5/28 to come to [facility].will have robin to call him with new appt

## 2017-03-17 ENCOUNTER — Ambulatory Visit
Admission: RE | Admit: 2017-03-17 | Discharge: 2017-03-17 | Disposition: A | Discharge status: Home / Self Care | Payer: Medicare Other | Source: Ambulatory Visit | Attending: Oncology | Admitting: Oncology

## 2017-03-17 DIAGNOSIS — I2510 Atherosclerotic heart disease of native coronary artery without angina pectoris: Secondary | ICD-10-CM | POA: Insufficient documentation

## 2017-03-17 DIAGNOSIS — C189 Malignant neoplasm of colon, unspecified: Secondary | ICD-10-CM

## 2017-03-17 DIAGNOSIS — I700 Atherosclerosis of aorta: Secondary | ICD-10-CM | POA: Diagnosis not present

## 2017-03-17 DIAGNOSIS — M899 Disorder of bone, unspecified: Secondary | ICD-10-CM | POA: Diagnosis not present

## 2017-03-17 DIAGNOSIS — R918 Other nonspecific abnormal finding of lung field: Secondary | ICD-10-CM | POA: Diagnosis not present

## 2017-03-17 MED ORDER — IOPAMIDOL (ISOVUE-300) INJECTION 61%
75.0000 mL | Freq: Once | INTRAVENOUS | Status: AC | PRN
  Administered 2017-03-17: 75 mL via INTRAVENOUS

## 2017-03-20 ENCOUNTER — Encounter: Payer: Self-pay | Admitting: *Deleted

## 2017-04-01 ENCOUNTER — Telehealth: Payer: Self-pay

## 2017-04-01 ENCOUNTER — Encounter: Payer: Self-pay | Admitting: Surgery

## 2017-04-01 ENCOUNTER — Ambulatory Visit (INDEPENDENT_AMBULATORY_CARE_PROVIDER_SITE_OTHER): Payer: Medicare Other | Admitting: Surgery

## 2017-04-01 VITALS — BP 131/71 | HR 79 | Temp 97.8°F | Ht 60.0 in | Wt 176.2 lb

## 2017-04-01 DIAGNOSIS — C186 Malignant neoplasm of descending colon: Secondary | ICD-10-CM

## 2017-04-01 NOTE — Telephone Encounter (Signed)
Patient was seen this morning with Dr. Burt Knack and has some questions that he forgot to ask. He would like to know if the wight restriction he was put on 2 weeks ago can go up from 20 pounds. He would also like to know if he can mow his lawn with a riding mower. Please call patient and advice.

## 2017-04-01 NOTE — Patient Instructions (Addendum)
We would like for you to see the Ostomy nurse. We will arrange this for you.Please see your follow up appointment listed below.   High-Protein and High-Calorie Diet Eating high-protein and high-calorie foods can help you to gain weight, heal after an injury, and recover after an illness or surgery. What is my plan? The specific amount of daily protein and calories you need depends on:  Your body weight.  The reason this diet is recommended for you. Generally, a high-protein, high-calorie diet involves:  Eating 250-500 extra calories each day.  Making sure that 10-35% of your daily calories come from protein. Talk to your health care provider about how much protein and how many calories you need each day. Follow the diet as directed by your health care provider. What do I need to know about this diet?  Ask your health care provider if you should take a nutritional supplement.  Try to eat six small meals each day instead of three large meals.  Eat a balanced diet, including one food that is high in protein at each meal.  Keep nutritious snacks handy, such as nuts, trail mixes, dried fruit, and yogurt.  If you have kidney disease or diabetes, eating too much protein may put extra stress on your kidneys. Talk to your health care provider if you have either of those conditions. What are some high-protein foods? Grains  Quinoa. Bulgur wheat. Vegetables  Soybeans. Peas. Meats and Other Protein Sources  Beef, pork, and poultry. Fish and seafood. Eggs. Tofu. Textured vegetable protein (TVP). Peanut butter. Nuts and seeds. Dried beans. Protein powders. Dairy  Whole milk. Whole-milk yogurt. Powdered milk. Cheese. [employer]. Eggnog. Beverages  High-protein supplement drinks. Soy milk. Other  Protein bars. The items listed above may not be a complete list of recommended foods or beverages. Contact your dietitian for more options.  What are some high-calorie foods? Grains  Pasta.  Quick breads. Muffins. Pancakes. Ready-to-eat cereal. Vegetables  Vegetables cooked in oil or butter. Fried potatoes. Fruits  Dried fruit. Fruit leather. Canned fruit in syrup. Fruit juice. Avocados. Meats and Other Protein Sources  Peanut butter. Nuts and seeds. Dairy  Heavy cream. Whipped cream. Cream cheese. Sour cream. Ice cream. Custard. Pudding. Beverages  Meal-replacement beverages. Nutrition shakes. Fruit juice. Sugar-sweetened soft drinks. Condiments  Salad dressing. Mayonnaise. Alfredo sauce. Fruit preserves or jelly. Honey. Syrup. Sweets/Desserts  Cake. Cookies. Pie. Pastries. Candy bars. Chocolate. Fats and Oils  Butter or margarine. Oil. Gravy. Other  Meal-replacement bars. The items listed above may not be a complete list of recommended foods or beverages. Contact your dietitian for more options.  What are some tips for including high-protein and high-calorie foods in my diet?  Add whole milk, half-and-half, or heavy cream to cereal, pudding, soup, or hot cocoa.  Add whole milk to instant breakfast drinks.  Add peanut butter to oatmeal or smoothies.  Add powdered milk to baked goods, smoothies, or milkshakes.  Add powdered milk, cream, or butter to mashed potatoes.  Add cheese to cooked vegetables.  Make whole-milk yogurt parfaits. Top them with granola, fruit, or nuts.  Add cottage cheese to your fruit.  Add avocados, cheese, or both to sandwiches or salads.  Add meat, poultry, or seafood to rice, pasta, casseroles, salads, and soups.  Use mayonnaise when making egg salad, chicken salad, or tuna salad.  Use peanut butter as a topping for pretzels, celery, or crackers.  Add beans to casseroles, dips, and spreads.  Add pureed beans to sauces and  soups.  Replace calorie-free drinks with calorie-containing drinks, such as milk and fruit juice. This information is not intended to replace advice given to you by your health care provider. Make sure you  discuss any questions you have with your health care provider. Document Released: 10/27/2005 Document Revised: 04/03/2016 Document Reviewed: 04/11/2014 Elsevier Interactive Patient Education  2017 [name].

## 2017-04-01 NOTE — Telephone Encounter (Signed)
Returned patients call at this time and informed him that he can test run his law mower with the blades up and see if he has any pain. He informed me that he cuts his lawn with the blades up already. I told him that was great and to just see how his body reacts to the turning and to take it easy while mowing. He asked about his weight restriction and I informed him that he has 3 more weeks from today before his weight restriction will be lifted. I gave him a date of 04/20/17 before he can try to lift a little heavier. I informed him to listen to his body when this date approaches and that if he has any pain, soreness, and/or pressure to stop activity immediately. I asked patient if all his questions had been answered he said they had been. Patient verbalized understanding.

## 2017-04-01 NOTE — Progress Notes (Signed)
Outpatient postop visit  04/01/2017  Miguel Flores is an 76 y.o. male.    Procedure: Colostomy and colon resection  OT:RRNHAFB around ostomy  HPI: This a patient underwent a colostomy: Resection for a large colon cancer. He is seen Dr. Janese Flores for chemotherapy in an adjuvant setting. He desires to have early closure of his colostomy. Patient seems to be doing well having no problems with hydration nor appetite. He has just completed his first round of chemotherapy and is due for 24 weeks. Medications reviewed.    Physical Exam:  There were no vitals taken for this visit.    PE: Soft nontender abdomen superficial abrasion in the skin fold below the appliance. Otherwise he appliance is not leaking and the abdominal wound is healed well    Assessment/Plan:  This a patient who has a large colon cancer but negative nodes with some worrisome features pathologically. Dr. Janese Flores has recommended adjuvant therapy.  Superficial abrasion in the skin crease below the appliance probably as a result of the appliance rubbing on the skin. Placed due to a derma pad over this. Instructed patient. I will arrange for an ostomy nurse visit as he has not had one outpatient.  [name], MD, FACS

## 2017-04-03 ENCOUNTER — Telehealth: Payer: Self-pay

## 2017-04-03 NOTE — Telephone Encounter (Signed)
I have contacted our Ostomy Nurse, Domenic Moras in regards to this patient. She will meet with the patient on 04/07/17 at 11am after his appointment with Dr. [name] in the [facility].  Patient has been made aware of this appointment.

## 2017-04-06 ENCOUNTER — Other Ambulatory Visit: Payer: Self-pay | Admitting: *Deleted

## 2017-04-06 DIAGNOSIS — C189 Malignant neoplasm of colon, unspecified: Secondary | ICD-10-CM

## 2017-04-07 ENCOUNTER — Encounter: Payer: Self-pay | Admitting: Oncology

## 2017-04-07 ENCOUNTER — Inpatient Hospital Stay: Payer: Medicare Other | Admitting: Oncology

## 2017-04-07 ENCOUNTER — Inpatient Hospital Stay: Payer: Medicare Other | Attending: Oncology | Admitting: Oncology

## 2017-04-07 VITALS — BP 133/71 | HR 87 | Temp 97.0°F | Ht 66.0 in | Wt 176.2 lb

## 2017-04-07 DIAGNOSIS — R911 Solitary pulmonary nodule: Secondary | ICD-10-CM | POA: Diagnosis not present

## 2017-04-07 DIAGNOSIS — I129 Hypertensive chronic kidney disease with stage 1 through stage 4 chronic kidney disease, or unspecified chronic kidney disease: Secondary | ICD-10-CM | POA: Diagnosis not present

## 2017-04-07 DIAGNOSIS — N189 Chronic kidney disease, unspecified: Secondary | ICD-10-CM | POA: Diagnosis not present

## 2017-04-07 DIAGNOSIS — Z85828 Personal history of other malignant neoplasm of skin: Secondary | ICD-10-CM

## 2017-04-07 DIAGNOSIS — E1122 Type 2 diabetes mellitus with diabetic chronic kidney disease: Secondary | ICD-10-CM

## 2017-04-07 DIAGNOSIS — E785 Hyperlipidemia, unspecified: Secondary | ICD-10-CM | POA: Diagnosis not present

## 2017-04-07 DIAGNOSIS — C189 Malignant neoplasm of colon, unspecified: Secondary | ICD-10-CM

## 2017-04-07 DIAGNOSIS — L309 Dermatitis, unspecified: Secondary | ICD-10-CM | POA: Diagnosis not present

## 2017-04-07 DIAGNOSIS — Z79899 Other long term (current) drug therapy: Secondary | ICD-10-CM | POA: Diagnosis not present

## 2017-04-07 DIAGNOSIS — C187 Malignant neoplasm of sigmoid colon: Secondary | ICD-10-CM | POA: Diagnosis present

## 2017-04-07 DIAGNOSIS — Z933 Colostomy status: Secondary | ICD-10-CM | POA: Diagnosis not present

## 2017-04-07 DIAGNOSIS — Z7984 Long term (current) use of oral hypoglycemic drugs: Secondary | ICD-10-CM | POA: Diagnosis not present

## 2017-04-07 LAB — CBC WITH DIFFERENTIAL/PLATELET
BASOS ABS: 0 10*3/uL (ref 0–0.1)
BASOS PCT: 0 %
Eosinophils Absolute: 0.2 10*3/uL (ref 0–0.7)
Eosinophils Relative: 4 %
HEMATOCRIT: 40.3 % (ref 40.0–52.0)
HEMOGLOBIN: 13.8 g/dL (ref 13.0–18.0)
LYMPHS PCT: 38 %
Lymphs Abs: 2.5 10*3/uL (ref 1.0–3.6)
MCH: 29.8 pg (ref 26.0–34.0)
MCHC: 34.1 g/dL (ref 32.0–36.0)
MCV: 87.3 fL (ref 80.0–100.0)
Monocytes Absolute: 0.5 10*3/uL (ref 0.2–1.0)
Monocytes Relative: 7 %
NEUTROS ABS: 3.3 10*3/uL (ref 1.4–6.5)
NEUTROS PCT: 51 %
Platelets: 266 10*3/uL (ref 150–440)
RBC: 4.61 MIL/uL (ref 4.40–5.90)
RDW: 14.3 % (ref 11.5–14.5)
WBC: 6.5 10*3/uL (ref 3.8–10.6)

## 2017-04-07 LAB — COMPREHENSIVE METABOLIC PANEL
ALBUMIN: 4.3 g/dL (ref 3.5–5.0)
ALK PHOS: 68 U/L (ref 38–126)
ALT: 15 U/L — ABNORMAL LOW (ref 17–63)
ANION GAP: 10 (ref 5–15)
AST: 23 U/L (ref 15–41)
BILIRUBIN TOTAL: 0.4 mg/dL (ref 0.3–1.2)
BUN: 13 mg/dL (ref 6–20)
CALCIUM: 9.2 mg/dL (ref 8.9–10.3)
CO2: 24 mmol/L (ref 22–32)
Chloride: 102 mmol/L (ref 101–111)
Creatinine, Ser: 1.15 mg/dL (ref 0.61–1.24)
GFR, EST NON AFRICAN AMERICAN: 60 mL/min — AB (ref >60)
GLUCOSE: 351 mg/dL — AB (ref 65–99)
POTASSIUM: 4 mmol/L (ref 3.5–5.1)
Sodium: 136 mmol/L (ref 135–145)
TOTAL PROTEIN: 7.3 g/dL (ref 6.5–8.1)

## 2017-04-07 NOTE — Progress Notes (Signed)
Please inform patient of his high blood sugar and notify pcp as well

## 2017-04-07 NOTE — Progress Notes (Signed)
Patient here for follow up. No changes since last appointment.  

## 2017-04-07 NOTE — Progress Notes (Signed)
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 Patient is a 76 year old male who presented with evidence of bowel obstruction on 02/06/2017. At that time he had progressive abdominal distention and no bowel movement for about one week. CT abdomen showed an apple core lesion in the descending/sigmoid colon.  2. Patient underwent Hartmann's procedure for obstructing sigmoid colon mass on 02/06/2017. Preoperative CEA was 5.0  3.  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 It is 0 out of 15 lymph nodes were positive for malignancy. Perineural and lymphovascular invasion was present. Margins were negative.pT3N0. MMR stable.  4. He is here to discuss adjuvant treatment options. He lives alone. PMH significant for HTN and diabetes. He is independent of his ADL's and IADL's  5. Given that he had high risk stage II colon cancer including he presented with obstruction and has LVI and perineural invasion- adjuvant xeloda chemotherapy was recommended for 6 months.   Interval history- tolerating xeloda well without significant side effects. No diarreha or rash. 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  HENT: Negative for congestion, ear discharge and nosebleeds.   Eyes: Negative for blurred vision.  Respiratory: Negative for cough, hemoptysis, sputum  production, shortness of breath and wheezing.   Cardiovascular: Negative for chest pain, palpitations, orthopnea and claudication.  Gastrointestinal: Negative for abdominal pain, blood in stool, constipation, diarrhea, heartburn, melena, nausea and vomiting.  Genitourinary: Negative for dysuria, flank pain, frequency, hematuria and urgency.  Musculoskeletal: Negative for back pain, joint pain and myalgias.  Skin: Negative for rash.  Neurological: Negative for dizziness, tingling, focal weakness, seizures, weakness and headaches.  Endo/Heme/Allergies: Does not bruise/bleed easily.  Psychiatric/Behavioral: Negative for depression and suicidal ideas. The patient does not have insomnia.       No Known Allergies   Past Medical History:  Diagnosis Date  . Cancer ([place])    skin cancer-nose w graft,also shoulder- basal cell per pt  . Cataract    r eye  . Chronic kidney disease    kidney stones 20 y ago per pt  . Diabetes mellitus without complication ([place])   . Hyperlipidemia   . Hypertension      Past Surgical History:  Procedure Laterality Date  . COLECTOMY WITH COLOSTOMY CREATION/HARTMANN PROCEDURE N/A 02/06/2017   Procedure: COLECTOMY WITH COLOSTOMY CREATION/HARTMANN PROCEDURE;  Surgeon: [name], MD;  Location: [facility] ORS;  Service: General;  Laterality: N/A;  . CYSTOSCOPY    . FLEXIBLE SIGMOIDOSCOPY N/A 02/06/2017   Procedure: FLEXIBLE SIGMOIDOSCOPY;  Surgeon: Christene Lye, MD;  Location: [facility] ENDOSCOPY;  Service: Endoscopy;  Laterality: N/A;    Social History   Social History  . Marital status: Married    Spouse name: N/A  . Number of children: N/A  . Years of education: N/A   Occupational History  . Not on file.   Social History Main Topics  . Smoking status: Never Smoker  .  Smokeless tobacco: Never Used  . Alcohol use No  . Drug use: No  . Sexual activity: Not Currently   Other Topics Concern  . Not on file   Social History Narrative  . No  narrative on file    Family History  Problem Relation Age of Onset  . Diabetes Mother   . AAA (abdominal aortic aneurysm) Mother   . Coronary artery disease Mother   . Diabetes Father   . Coronary artery disease Father   . Dementia Father   . Breast cancer Sister      Current Outpatient Prescriptions:  .  Blood Glucose Monitoring Suppl (GLUCOCOM BLOOD GLUCOSE MONITOR) DEVI, 1 Device by Other route every morning., Disp: , Rfl:  .  capecitabine (XELODA) 500 MG tablet, Take 4 tablets (2,000 mg total) by mouth 2 (two) times daily after a meal. Take after food (within 30 minutes of a meal) with water for 14 days and then take 1 week off and then repeat cycle, Disp: 224 tablet, Rfl: 2 .  Docusate Sodium (DOCU PO), Take 1 capsule by mouth 2 (two) times daily., Disp: , Rfl:  .  ferrous sulfate 324 (65 Fe) MG TBEC, Take 324 mg by mouth daily., Disp: , Rfl:  .  glimepiride (AMARYL) 2 MG tablet, Take 2 mg by mouth daily., Disp: , Rfl:  .  lisinopril (PRINIVIL,ZESTRIL) 5 MG tablet, Take 5 mg by mouth daily., Disp: , Rfl:  .  loratadine (CLARITIN) 10 MG tablet, Take 10 mg by mouth daily., Disp: , Rfl:  .  losartan (COZAAR) 25 MG tablet, Take 25 mg by mouth daily., Disp: , Rfl:  .  metFORMIN (GLUCOPHAGE) 1000 MG tablet, Take 1,000-1,500 tablets by mouth 2 (two) times daily. Take 1 tablet in AM and 1 1/2 tablets at supper, Disp: , Rfl:  .  ondansetron (ZOFRAN) 8 MG tablet, Take 1 tablet (8 mg total) by mouth 2 (two) times daily as needed (Nausea or vomiting)., Disp: 30 tablet, Rfl: 1 .  pravastatin (PRAVACHOL) 20 MG tablet, Take 20 mg by mouth daily., Disp: , Rfl:  .  prochlorperazine (COMPAZINE) 10 MG tablet, Take 1 tablet (10 mg total) by mouth every 6 (six) hours as needed (Nausea or vomiting)., Disp: 30 tablet, Rfl: 1  Physical exam:  Vitals:   04/07/17 0956  BP: 133/71  Pulse: 87  Temp: 97 F (36.1 C)  TempSrc: Tympanic  Weight: 176 lb 3.2 oz (79.9 kg)  Height: '5\' 6"'  (1.676 m)    Physical Exam  Constitutional: He is oriented to person, place, and time and well-developed, well-nourished, and in no distress.  HENT:  Head: Normocephalic and atraumatic.  Eyes: EOM are normal. Pupils are equal, round, and reactive to light.  Neck: Normal range of motion.  Cardiovascular: Normal rate, regular rhythm and normal heart sounds.   Pulmonary/Chest: Effort normal and breath sounds normal.  Abdominal: Soft. Bowel sounds are normal.  Colostomy in place  Neurological: He is alert and oriented to person, place, and time.  Skin: Skin is warm and dry.  Chronic changes of eczema seen over right forearm     CMP Latest Ref Rng & Units 03/09/2017  Glucose 65 - 99 mg/dL 73  BUN 6 - 20 mg/dL 18  Creatinine 0.61 - 1.24 mg/dL 0.98  Sodium 135 - 145 mmol/L 137  Potassium 3.5 - 5.1 mmol/L 4.7  Chloride 101 - 111 mmol/L 105  CO2 22 - 32 mmol/L 24  Calcium 8.9 - 10.3 mg/dL 10.0  Total Protein 6.5 - 8.1 g/dL 7.9  Total Bilirubin 0.3 - 1.2 mg/dL 0.3  Alkaline Phos 38 - 126 U/L 65  AST 15 - 41 U/L 19  ALT 17 - 63 U/L 15(L)   CBC Latest Ref Rng & Units 04/07/2017  WBC 3.8 - 10.6 K/uL 6.5  Hemoglobin 13.0 - 18.0 g/dL 13.8  Hematocrit 40.0 - 52.0 % 40.3  Platelets 150 - 440 K/uL 266    No images are attached to the encounter.  Ct Chest W Contrast  Result Date: 03/17/2017 CLINICAL DATA:  Recent diagnosis of colon cancer. Evaluate for lung metastasis. History of skin cancer. EXAM: CT CHEST WITH CONTRAST TECHNIQUE: Multidetector CT imaging of the chest was performed during intravenous contrast administration. CONTRAST:  2m ISOVUE-300 IOPAMIDOL (ISOVUE-300) INJECTION 61% COMPARISON:  Chest radiograph 02/05/2017.  Abdominal CT 02/05/2017 FINDINGS: Cardiovascular: Coronary calcifications involving left and right coronary arteries. Normal caliber of thoracic aorta with atherosclerotic calcifications. The great vessels are patent. Main portal venous system is patent. Heart size is normal.  Mediastinum/Nodes: Esophagus is unremarkable. No significant lymph node enlargement in the mediastinum or hila. No significant supraclavicular lymphadenopathy. No significant axillary lymphadenopathy. Lungs/Pleura: Trachea and mainstem bronchi are patent. Subtle 5 mm nodule at the right lung base on sequence 3, image 126 may be new. There appears to be some peripheral volume loss and atelectasis at the posterior right lung base which is new or progressed. 3 mm nodule in left upper lobe on sequence 3, image 55. Stable 6 mm pleural-based nodule in the anterior left upper lobe on sequence 3, image 81. 6 mm nodule in left upper lobe on sequence 3, image 69. Patchy densities near the lingula most likely associated with atelectasis. No pleural effusions. Upper Abdomen: Small low-density structure at the dome of the liver measures roughly 7 mm and probably represents a small cyst. No suspicious lesions in the visualized aspects of the liver. Mild nodularity of the adrenal tissue is nonspecific. Cannot exclude small nodules or adenomas. Small lymph nodes near the porta hepatis are nonspecific. Overall, no significant lymphadenopathy in the upper abdomen. Musculoskeletal: Subtle lucent areas involving the anterior vertebral body of T9 and the posterior vertebral body of T10. These are unchanged from the recent abdominal CT. IMPRESSION: There are a few indeterminate pulmonary nodules, largest measuring up to 6 mm. Question a new 5 mm nodule at the right lung base but this could be related to atelectasis. Consider a short-term follow-up in 3 months to ensure stability of these small pulmonary nodules. Again noted are small lucent lesions in the bones of T9 and T10. Minimal change within the recent abdominal CT. The small lucent lesions remain indeterminate. These bone lesions could be more definitively characterized with an MRI with and without contrast. Atherosclerotic disease with coronary artery calcifications.  Electronically Signed   By: AMarkus DaftM.D.   On: 03/17/2017 08:55     Assessment and plan- Patient is a 76y.o. male with stage II colon cancer T3N0M0 s/p surgery currently on adjuvant xeloda s/p 1 cycle  Counts are ok to proceed with cycle 2 of xeloda. I will see him in 3 weeks prior to cycle 3 with cbc, cmp. Plan is to get total of 8 cycles. Colostomy reversal after chemotherapy is completed  Discussed ct thorax results- he has sub cm lung nodules. We will repeat ct in 6 months after he completes chemo    Visit Diagnosis 1. 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[place]   2. High risk medication use  3. Lung nodule      Dr. Randa Evens, MD, MPH [place] at [facility] Pager- [phone] 04/07/2017 10:00 AM

## 2017-04-08 ENCOUNTER — Telehealth: Payer: Self-pay | Admitting: *Deleted

## 2017-04-08 NOTE — Telephone Encounter (Signed)
-----   Message from Sindy Guadeloupe, MD sent at 04/07/2017 10:22 AM EDT ----- Please inform patient of his high blood sugar and notify pcp as well

## 2017-04-08 NOTE — Telephone Encounter (Signed)
No it's ok. Patient is responsible. If it's high next time will inform pcp

## 2017-04-08 NOTE — Telephone Encounter (Signed)
Called pt to let him know about his sugar level and he states that the day he came he went to [employer] and hot cakes with syrup on it.  He states that his last A1c was 7.3.  He checks his sugars on a regular basis and it is always below 200. He has routine visits with his PCP and gives him a diary of his sugars.  I told him I will share this info and if she would like me to do anything else I would let him know.

## 2017-04-09 NOTE — Telephone Encounter (Signed)
Ok thanks 

## 2017-04-27 ENCOUNTER — Other Ambulatory Visit: Payer: Self-pay

## 2017-04-27 DIAGNOSIS — C189 Malignant neoplasm of colon, unspecified: Secondary | ICD-10-CM

## 2017-04-28 ENCOUNTER — Encounter: Payer: Self-pay | Admitting: Oncology

## 2017-04-28 ENCOUNTER — Inpatient Hospital Stay: Payer: Medicare Other

## 2017-04-28 ENCOUNTER — Inpatient Hospital Stay: Payer: Medicare Other | Attending: Oncology | Admitting: Oncology

## 2017-04-28 VITALS — BP 138/72 | HR 78 | Temp 95.4°F | Resp 18 | Wt 179.9 lb

## 2017-04-28 DIAGNOSIS — Z79899 Other long term (current) drug therapy: Secondary | ICD-10-CM

## 2017-04-28 DIAGNOSIS — R21 Rash and other nonspecific skin eruption: Secondary | ICD-10-CM | POA: Diagnosis not present

## 2017-04-28 DIAGNOSIS — R195 Other fecal abnormalities: Secondary | ICD-10-CM | POA: Diagnosis not present

## 2017-04-28 DIAGNOSIS — Z85828 Personal history of other malignant neoplasm of skin: Secondary | ICD-10-CM | POA: Diagnosis not present

## 2017-04-28 DIAGNOSIS — Z933 Colostomy status: Secondary | ICD-10-CM | POA: Insufficient documentation

## 2017-04-28 DIAGNOSIS — Z87442 Personal history of urinary calculi: Secondary | ICD-10-CM | POA: Diagnosis not present

## 2017-04-28 DIAGNOSIS — E785 Hyperlipidemia, unspecified: Secondary | ICD-10-CM | POA: Insufficient documentation

## 2017-04-28 DIAGNOSIS — Z7984 Long term (current) use of oral hypoglycemic drugs: Secondary | ICD-10-CM

## 2017-04-28 DIAGNOSIS — C187 Malignant neoplasm of sigmoid colon: Secondary | ICD-10-CM | POA: Insufficient documentation

## 2017-04-28 DIAGNOSIS — I129 Hypertensive chronic kidney disease with stage 1 through stage 4 chronic kidney disease, or unspecified chronic kidney disease: Secondary | ICD-10-CM | POA: Insufficient documentation

## 2017-04-28 DIAGNOSIS — N189 Chronic kidney disease, unspecified: Secondary | ICD-10-CM | POA: Diagnosis not present

## 2017-04-28 DIAGNOSIS — C189 Malignant neoplasm of colon, unspecified: Secondary | ICD-10-CM

## 2017-04-28 DIAGNOSIS — Z803 Family history of malignant neoplasm of breast: Secondary | ICD-10-CM | POA: Diagnosis not present

## 2017-04-28 DIAGNOSIS — R911 Solitary pulmonary nodule: Secondary | ICD-10-CM

## 2017-04-28 LAB — COMPREHENSIVE METABOLIC PANEL
ALK PHOS: 63 U/L (ref 38–126)
ALT: 14 U/L — AB (ref 17–63)
ANION GAP: 8 (ref 5–15)
AST: 24 U/L (ref 15–41)
Albumin: 4.3 g/dL (ref 3.5–5.0)
BILIRUBIN TOTAL: 0.4 mg/dL (ref 0.3–1.2)
BUN: 15 mg/dL (ref 6–20)
CALCIUM: 9.1 mg/dL (ref 8.9–10.3)
CO2: 24 mmol/L (ref 22–32)
Chloride: 106 mmol/L (ref 101–111)
Creatinine, Ser: 1.15 mg/dL (ref 0.61–1.24)
GFR calc non Af Amer: 60 mL/min — ABNORMAL LOW (ref >60)
Glucose, Bld: 194 mg/dL — ABNORMAL HIGH (ref 65–99)
Potassium: 4.7 mmol/L (ref 3.5–5.1)
SODIUM: 138 mmol/L (ref 135–145)
TOTAL PROTEIN: 7.2 g/dL (ref 6.5–8.1)

## 2017-04-28 LAB — CBC WITH DIFFERENTIAL/PLATELET
Basophils Absolute: 0 10*3/uL (ref 0–0.1)
Basophils Relative: 1 %
EOS ABS: 0.2 10*3/uL (ref 0–0.7)
Eosinophils Relative: 3 %
HEMATOCRIT: 38.2 % — AB (ref 40.0–52.0)
HEMOGLOBIN: 13.2 g/dL (ref 13.0–18.0)
LYMPHS ABS: 2.4 10*3/uL (ref 1.0–3.6)
LYMPHS PCT: 32 %
MCH: 30.5 pg (ref 26.0–34.0)
MCHC: 34.6 g/dL (ref 32.0–36.0)
MCV: 88.3 fL (ref 80.0–100.0)
MONOS PCT: 9 %
Monocytes Absolute: 0.7 10*3/uL (ref 0.2–1.0)
NEUTROS ABS: 4.3 10*3/uL (ref 1.4–6.5)
NEUTROS PCT: 55 %
Platelets: 274 10*3/uL (ref 150–440)
RBC: 4.32 MIL/uL — AB (ref 4.40–5.90)
RDW: 16.3 % — ABNORMAL HIGH (ref 11.5–14.5)
WBC: 7.6 10*3/uL (ref 3.8–10.6)

## 2017-04-28 NOTE — Progress Notes (Signed)
Here for follow up. Stated doing well. Daughter in law helps q week w colostomy care.

## 2017-04-28 NOTE — Progress Notes (Signed)
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 Patient is a 76 year old male who presented with evidence of bowel obstruction on 02/06/2017. At that time he had progressive abdominal distention and no bowel movement for about one week. CT abdomen showed an apple core lesion in the descending/sigmoid colon.  2. Patient underwent Hartmann's procedure for obstructing sigmoid colon mass on 02/06/2017. Preoperative CEA was 5.0  3. 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 It is 0 out of 15 lymph nodes were positive for malignancy. Perineural and lymphovascular invasion was present. Margins were negative.pT3N0. MMR stable.  4. He is here to discuss adjuvant treatment options. He lives alone. PMH significant for HTN and diabetes. He is independent of his ADL's and IADL's  5. Given that he had high risk stage II colon cancer including he presented with obstruction and has LVI and perineural invasion- adjuvant xeloda chemotherapy was recommended for 6 months.    Interval history- reports that his stools are occasionally black about once every other week. No fresh blood in ostomy. No diarrhea He has developed scattered eryhtmeatous macular lesions in b/l forearms sometimes worse when exposed to sunlight.   ECOG PS- 0 Pain scale- 0   Review of systems- Review of Systems  Constitutional: Negative for chills, fever, malaise/fatigue and weight loss.  HENT: Negative for congestion, ear  discharge and nosebleeds.   Eyes: Negative for blurred vision.  Respiratory: Negative for cough, hemoptysis, sputum production, shortness of breath and wheezing.   Cardiovascular: Negative for chest pain, palpitations, orthopnea and claudication.  Gastrointestinal: Negative for abdominal pain, blood in stool, constipation, diarrhea, heartburn, melena, nausea and vomiting.  Genitourinary: Negative for dysuria, flank pain, frequency, hematuria and urgency.  Musculoskeletal: Negative for back pain, joint pain and myalgias.  Skin: Positive for rash.  Neurological: Negative for dizziness, tingling, focal weakness, seizures, weakness and headaches.  Endo/Heme/Allergies: Does not bruise/bleed easily.  Psychiatric/Behavioral: Negative for depression and suicidal ideas. The patient does not have insomnia.       No Known Allergies   Past Medical History:  Diagnosis Date  . Cancer ([place])    skin cancer-nose w graft,also shoulder- basal cell per pt  . Cataract    r eye  . Chronic kidney disease    kidney stones 20 y ago per pt  . Diabetes mellitus without complication ([place])   . Hyperlipidemia   . Hypertension      Past Surgical History:  Procedure Laterality Date  . COLECTOMY WITH COLOSTOMY CREATION/HARTMANN PROCEDURE N/A 02/06/2017   Procedure: COLECTOMY WITH COLOSTOMY CREATION/HARTMANN PROCEDURE;  Surgeon: [name], MD;  Location: [facility] ORS;  Service: General;  Laterality: N/A;  . CYSTOSCOPY    . FLEXIBLE SIGMOIDOSCOPY N/A 02/06/2017   Procedure: FLEXIBLE SIGMOIDOSCOPY;  Surgeon: Christene Lye, MD;  Location: [facility] ENDOSCOPY;  Service: Endoscopy;  Laterality: N/A;    Social History   Social History  . Marital status: Married    Spouse name: N/A  . Number of children: N/A  . Years of education: N/A  Occupational History  . Not on file.   Social History Main Topics  . Smoking status: Never Smoker  . Smokeless tobacco: Never Used  . Alcohol use No  . Drug use: No   . Sexual activity: Not Currently   Other Topics Concern  . Not on file   Social History Narrative  . No narrative on file    Family History  Problem Relation Age of Onset  . Diabetes Mother   . AAA (abdominal aortic aneurysm) Mother   . Coronary artery disease Mother   . Diabetes Father   . Coronary artery disease Father   . Dementia Father   . Breast cancer Sister      Current Outpatient Prescriptions:  .  Blood Glucose Monitoring Suppl (GLUCOCOM BLOOD GLUCOSE MONITOR) DEVI, 1 Device by Other route every morning., Disp: , Rfl:  .  capecitabine (XELODA) 500 MG tablet, Take 4 tablets (2,000 mg total) by mouth 2 (two) times daily after a meal. Take after food (within 30 minutes of a meal) with water for 14 days and then take 1 week off and then repeat cycle, Disp: 224 tablet, Rfl: 2 .  Docusate Sodium (DOCU PO), Take 1 capsule by mouth 2 (two) times daily., Disp: , Rfl:  .  ferrous sulfate 324 (65 Fe) MG TBEC, Take 324 mg by mouth daily., Disp: , Rfl:  .  glimepiride (AMARYL) 2 MG tablet, Take 2 mg by mouth daily., Disp: , Rfl:  .  lisinopril (PRINIVIL,ZESTRIL) 5 MG tablet, Take 5 mg by mouth daily., Disp: , Rfl:  .  loratadine (CLARITIN) 10 MG tablet, Take 10 mg by mouth daily., Disp: , Rfl:  .  losartan (COZAAR) 25 MG tablet, Take 25 mg by mouth daily., Disp: , Rfl:  .  metFORMIN (GLUCOPHAGE) 1000 MG tablet, Take 1,000-1,500 tablets by mouth 2 (two) times daily. Take 1 tablet in AM and 1 1/2 tablets at supper, Disp: , Rfl:  .  ondansetron (ZOFRAN) 8 MG tablet, Take 1 tablet (8 mg total) by mouth 2 (two) times daily as needed (Nausea or vomiting)., Disp: 30 tablet, Rfl: 1 .  pravastatin (PRAVACHOL) 20 MG tablet, Take 20 mg by mouth daily., Disp: , Rfl:  .  prochlorperazine (COMPAZINE) 10 MG tablet, Take 1 tablet (10 mg total) by mouth every 6 (six) hours as needed (Nausea or vomiting)., Disp: 30 tablet, Rfl: 1  Physical exam:  Vitals:   04/28/17 0933  BP: 138/72  Pulse: 78    Resp: 18  Temp: (!) 95.4 F (35.2 C)  TempSrc: Tympanic  Weight: 179 lb 14.4 oz (81.6 kg)   Physical Exam  Constitutional: He is oriented to person, place, and time and well-developed, well-nourished, and in no distress.  HENT:  Head: Normocephalic and atraumatic.  Eyes: EOM are normal. Pupils are equal, round, and reactive to light.  Neck: Normal range of motion.  Cardiovascular: Normal rate, regular rhythm and normal heart sounds.   Pulmonary/Chest: Effort normal and breath sounds normal.  Abdominal: Soft. Bowel sounds are normal.  Ostomy in place which has formed stool. Stool appears partly dark and partly brown. No blood  Neurological: He is alert and oriented to person, place, and time.  Skin: Skin is warm and dry.  Scattered erythematous macular irregular lesions over b/l forearms     CMP Latest Ref Rng & Units 04/28/2017  Glucose 65 - 99 mg/dL 194(H)  BUN 6 - 20 mg/dL 15  Creatinine 0.61 - 1.24 mg/dL 1.15  Sodium  135 - 145 mmol/L 138  Potassium 3.5 - 5.1 mmol/L 4.7  Chloride 101 - 111 mmol/L 106  CO2 22 - 32 mmol/L 24  Calcium 8.9 - 10.3 mg/dL 9.1  Total Protein 6.5 - 8.1 g/dL 7.2  Total Bilirubin 0.3 - 1.2 mg/dL 0.4  Alkaline Phos 38 - 126 U/L 63  AST 15 - 41 U/L 24  ALT 17 - 63 U/L 14(L)   CBC Latest Ref Rng & Units 04/28/2017  WBC 3.8 - 10.6 K/uL 7.6  Hemoglobin 13.0 - 18.0 g/dL 13.2  Hematocrit 40.0 - 52.0 % 38.2(L)  Platelets 150 - 440 K/uL 274      Assessment and plan- Patient is a 76 y.o. male  with stage II colon cancer T3N0M0 s/p surgery currently on adjuvant xeloda s/p 2 cycles  Labs reviewed today and are WNL. Ok to proceed with cycle 3 of xeloda. He is tolerating treatment very well with no significant side effects. Plan is to give him total of 8 cycles. rtc in 3 weeks prior to start of cycle 4  Skin rash- looks like photosensitive rash. He does not have typical xeloda rash which causes hand/foot rash. Advised to use sun protection. If rash persists  he can try topical steroids that he uses for his eczema  Dark stools- no frank blood. H/H stable. Continue to monitor    Visit Diagnosis 1. 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[place])   2. High risk medication use      Dr. Randa Evens, MD, MPH [facility] at [facility] Pager- [phone] 04/28/2017 12:56 PM

## 2017-05-06 ENCOUNTER — Encounter: Payer: Self-pay | Admitting: Surgery

## 2017-05-06 ENCOUNTER — Ambulatory Visit (INDEPENDENT_AMBULATORY_CARE_PROVIDER_SITE_OTHER): Payer: Medicare Other | Admitting: Surgery

## 2017-05-06 VITALS — BP 149/72 | HR 80 | Temp 97.5°F | Ht 60.0 in | Wt 177.8 lb

## 2017-05-06 DIAGNOSIS — C186 Malignant neoplasm of descending colon: Secondary | ICD-10-CM

## 2017-05-06 NOTE — Patient Instructions (Addendum)
Please contact our office after you finish your last dose of Chemo so that we can setup your appointment with our office. A  You are no longer on a lifting restriction  Please call our office with any questions or concerns.  At that time- Listen to your body when lifting, if you have pain when lifting, stop and then try again in a few days. Pain after doing exercises or activities of daily living is normal as you get back in to your normal routine.  If you develop redness, drainage, or pain at incision sites- call our office immediately and speak with a nurse.

## 2017-05-06 NOTE — Progress Notes (Signed)
Outpatient postop visit  05/06/2017  Miguel Flores is an 76 y.o. male.    Procedure: Status post Hartman's procedure for obstructing colon cancer  CC: No problems  HPI: Patient status post Hartman's procedure for an obstructing colon cancer. Previous fluid seen him for an abrasion at the skin crease below his pannus and that is been repaired with healing from the use of duodenum arm etc. He has seen the wound care clinic. He is finishing his chemotherapy in October and November. He seems to be doing well at this time he does have some problems with paresthesias of his fingers and decreased appetite but seems to be doing well with chemotherapy.  Medications reviewed.    Physical Exam:  There were no vitals taken for this visit.    PE: Abdomen is soft nontender completely healed rash and abrasion. Ostomy is functional.    Assessment/Plan:  Patient doing quite well recommend that I see him towards the end of his chemotherapy in October and November and we'll arrange for both a colonoscopy and a CT scan if it is not done by oncology. Then we can talk about colostomy closure.  [name], MD, FACS

## 2017-05-07 ENCOUNTER — Telehealth: Payer: Self-pay | Admitting: *Deleted

## 2017-05-07 NOTE — Telephone Encounter (Signed)
Pt is having labs for [place] and for Dr. Carlean Jews his PCP 1day apart and wondered if they can be done together or if one doctors labs covers all the other dr would want. I told him I will check with dr. Carlean Jews office and I called them and pt is due to have cbc/d and met c and a whole lot of there things and our office just needed cbc/d, met c.  I have asked schedulers to cancel the lab encounter for [place] next week and we will review the labs from Dr. Carlean Jews office. I called the pt and let him know all the labs we needed will be done at Dr. Carlean Jews office and we can look in care everywhere and get labs results. I will cancel his lab appt next week but he will still need to come ot md appt and he is agreeable.

## 2017-05-12 ENCOUNTER — Telehealth: Payer: Self-pay | Admitting: *Deleted

## 2017-05-12 NOTE — Telephone Encounter (Signed)
I can see him on thursday

## 2017-05-12 NOTE — Telephone Encounter (Signed)
Patient agrees to appt on Thursday at 930 AM

## 2017-05-12 NOTE — Telephone Encounter (Addendum)
Patient called to report that the redness in his hands is getting worse and now he has red splotches up his arms. He is using cream and vaseline as instructed by on call doctor States he would feel better if he was evaluated.Please advise, should I get Sonia Baller to see him?

## 2017-05-14 ENCOUNTER — Encounter: Payer: Self-pay | Admitting: Oncology

## 2017-05-14 ENCOUNTER — Inpatient Hospital Stay: Payer: Medicare Other | Attending: Oncology | Admitting: Oncology

## 2017-05-14 VITALS — BP 126/78 | HR 72 | Temp 97.1°F | Resp 18 | Wt 176.4 lb

## 2017-05-14 DIAGNOSIS — N189 Chronic kidney disease, unspecified: Secondary | ICD-10-CM | POA: Insufficient documentation

## 2017-05-14 DIAGNOSIS — Z7984 Long term (current) use of oral hypoglycemic drugs: Secondary | ICD-10-CM | POA: Diagnosis not present

## 2017-05-14 DIAGNOSIS — Z85828 Personal history of other malignant neoplasm of skin: Secondary | ICD-10-CM | POA: Insufficient documentation

## 2017-05-14 DIAGNOSIS — I129 Hypertensive chronic kidney disease with stage 1 through stage 4 chronic kidney disease, or unspecified chronic kidney disease: Secondary | ICD-10-CM

## 2017-05-14 DIAGNOSIS — T451X5A Adverse effect of antineoplastic and immunosuppressive drugs, initial encounter: Secondary | ICD-10-CM

## 2017-05-14 DIAGNOSIS — L271 Localized skin eruption due to drugs and medicaments taken internally: Secondary | ICD-10-CM | POA: Insufficient documentation

## 2017-05-14 DIAGNOSIS — Z803 Family history of malignant neoplasm of breast: Secondary | ICD-10-CM | POA: Diagnosis not present

## 2017-05-14 DIAGNOSIS — Z79899 Other long term (current) drug therapy: Secondary | ICD-10-CM | POA: Insufficient documentation

## 2017-05-14 DIAGNOSIS — Z933 Colostomy status: Secondary | ICD-10-CM | POA: Diagnosis not present

## 2017-05-14 DIAGNOSIS — C187 Malignant neoplasm of sigmoid colon: Secondary | ICD-10-CM | POA: Diagnosis not present

## 2017-05-14 DIAGNOSIS — Z87442 Personal history of urinary calculi: Secondary | ICD-10-CM | POA: Insufficient documentation

## 2017-05-14 DIAGNOSIS — C189 Malignant neoplasm of colon, unspecified: Secondary | ICD-10-CM

## 2017-05-14 DIAGNOSIS — E785 Hyperlipidemia, unspecified: Secondary | ICD-10-CM | POA: Diagnosis not present

## 2017-05-14 DIAGNOSIS — R21 Rash and other nonspecific skin eruption: Secondary | ICD-10-CM | POA: Diagnosis not present

## 2017-05-14 MED ORDER — UREA 10 % EX CREA: TOPICAL_CREAM | CUTANEOUS | 0 refills | Status: DC | PRN

## 2017-05-14 NOTE — Progress Notes (Signed)
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 Patient is a 76 year old male who presented with evidence of bowel obstruction on 02/06/2017. At that time he had progressive abdominal distention and no bowel movement for about one week. CT abdomen showed an apple core lesion in the descending/sigmoid colon.  2. Patient underwent Hartmann's procedure for obstructing sigmoid colon mass on 02/06/2017. Preoperative CEA was 5.0  3. 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 It is 0 out of 15 lymph nodes were positive for malignancy. Perineural and lymphovascular invasion was present. Margins were negative.pT3N0. MMR stable.  4. He is here to discuss adjuvant treatment options. He lives alone. PMH significant for HTN and diabetes. He is independent of his ADL's and IADL's  5. Given that he had high risk stage II colon cancer including he presented with obstruction and has LVI and perineural invasion- adjuvant xeloda chemotherapy was recommended for 6 months.    Interval history- erythematous rash over forearms getting worse. He now has redness involving his b/l hands and feet. No blisters  ECOG PS- 0 Pain scale- 0   Review of systems- Review of Systems  Constitutional: Negative for chills, fever, malaise/fatigue and weight loss.  HENT: Negative for congestion, ear discharge and nosebleeds.   Eyes: Negative for blurred vision.  Respiratory: Negative  for cough, hemoptysis, sputum production, shortness of breath and wheezing.   Cardiovascular: Negative for chest pain, palpitations, orthopnea and claudication.  Gastrointestinal: Negative for abdominal pain, blood in stool, constipation, diarrhea, heartburn, melena, nausea and vomiting.  Genitourinary: Negative for dysuria, flank pain, frequency, hematuria and urgency.  Musculoskeletal: Negative for back pain, joint pain and myalgias.  Skin: Positive for rash.  Neurological: Negative for dizziness, tingling, focal weakness, seizures, weakness and headaches.  Endo/Heme/Allergies: Does not bruise/bleed easily.  Psychiatric/Behavioral: Negative for depression and suicidal ideas. The patient does not have insomnia.        No Known Allergies   Past Medical History:  Diagnosis Date  . Cancer ([place])    skin cancer-nose w graft,also shoulder- basal cell per pt  . Cataract    r eye  . Chronic kidney disease    kidney stones 20 y ago per pt  . Diabetes mellitus without complication ([place])   . Hyperlipidemia   . Hypertension      Past Surgical History:  Procedure Laterality Date  . COLECTOMY WITH COLOSTOMY CREATION/HARTMANN PROCEDURE N/A 02/06/2017   Procedure: COLECTOMY WITH COLOSTOMY CREATION/HARTMANN PROCEDURE;  Surgeon: [name], MD;  Location: [facility] ORS;  Service: General;  Laterality: N/A;  . CYSTOSCOPY    . FLEXIBLE SIGMOIDOSCOPY N/A 02/06/2017   Procedure: FLEXIBLE SIGMOIDOSCOPY;  Surgeon: Christene Lye, MD;  Location: [facility] ENDOSCOPY;  Service: Endoscopy;  Laterality: N/A;    Social History   Social History  . Marital status: Married    Spouse name: N/A  . Number of children: N/A  . Years of education: N/A   Occupational History  . Not on file.   Social History Main  Topics  . Smoking status: Never Smoker  . Smokeless tobacco: Never Used  . Alcohol use No  . Drug use: No  . Sexual activity: Not Currently   Other Topics Concern  . Not on file    Social History Narrative  . No narrative on file    Family History  Problem Relation Age of Onset  . Diabetes Mother   . AAA (abdominal aortic aneurysm) Mother   . Coronary artery disease Mother   . Diabetes Father   . Coronary artery disease Father   . Dementia Father   . Breast cancer Sister      Current Outpatient Prescriptions:  .  Apremilast (OTEZLA) 30 MG TABS, Take 1 tablet by mouth daily., Disp: , Rfl:  .  Blood Glucose Monitoring Suppl (GLUCOCOM BLOOD GLUCOSE MONITOR) DEVI, 1 Device by Other route every morning., Disp: , Rfl:  .  Blood Glucose Monitoring Suppl (ONETOUCH VERIO) w/Device KIT, 1 each by XX route as directed. Dx: E11.9, Disp: , Rfl:  .  capecitabine (XELODA) 500 MG tablet, Take 4 tablets (2,000 mg total) by mouth 2 (two) times daily after a meal. Take after food (within 30 minutes of a meal) with water for 14 days and then take 1 week off and then repeat cycle, Disp: 224 tablet, Rfl: 2 .  Docusate Sodium (DOCU PO), Take 1 capsule by mouth 2 (two) times daily., Disp: , Rfl:  .  ferrous sulfate 324 (65 Fe) MG TBEC, Take 324 mg by mouth daily., Disp: , Rfl:  .  glimepiride (AMARYL) 2 MG tablet, Take 2 mg by mouth daily., Disp: , Rfl:  .  Lancets 30G MISC, Use 1 each as directed. Check CBG's fasting once daily. Dx: E11.9, Disp: , Rfl:  .  lisinopril (PRINIVIL,ZESTRIL) 5 MG tablet, Take 5 mg by mouth daily., Disp: , Rfl:  .  loratadine (CLARITIN) 10 MG tablet, Take 10 mg by mouth daily., Disp: , Rfl:  .  losartan (COZAAR) 25 MG tablet, Take 25 mg by mouth daily., Disp: , Rfl:  .  metFORMIN (GLUCOPHAGE) 1000 MG tablet, Take 1,000-1,500 tablets by mouth 2 (two) times daily. Take 1 tablet in AM and 1 1/2 tablets at supper, Disp: , Rfl:  .  ondansetron (ZOFRAN) 8 MG tablet, Take 1 tablet (8 mg total) by mouth 2 (two) times daily as needed (Nausea or vomiting)., Disp: 30 tablet, Rfl: 1 .  pravastatin (PRAVACHOL) 20 MG tablet, Take 20 mg by mouth daily., Disp: , Rfl:  .   prochlorperazine (COMPAZINE) 10 MG tablet, Take 1 tablet (10 mg total) by mouth every 6 (six) hours as needed (Nausea or vomiting)., Disp: 30 tablet, Rfl: 1 .  urea (CARMOL) 10 % cream, Apply topically as needed. Apply to hands and feet two to three times a day as needed., Disp: 71 g, Rfl: 0  Physical exam:  Vitals:   05/14/17 0919  BP: 126/78  Pulse: 72  Resp: 18  Temp: (!) 97.1 F (36.2 C)  TempSrc: Tympanic  Weight: 176 lb 7 oz (80 kg)   Physical Exam  Constitutional: He is oriented to person, place, and time and well-developed, well-nourished, and in no distress.  HENT:  Head: Normocephalic and atraumatic.  Eyes: EOM are normal. Pupils are equal, round, and reactive to light.  Neck: Normal range of motion.  Cardiovascular: Normal rate, regular rhythm and normal heart sounds.   Pulmonary/Chest: Effort normal and breath sounds normal.  Abdominal: Soft. Bowel sounds are normal.  Neurological:  He is alert and oriented to person, place, and time.  Skin: Skin is warm and dry.  teher is diffuse erythema involving b/l hands and feet. Erythematous macular lesions over forearms appear worse     CMP Latest Ref Rng & Units 04/28/2017  Glucose 65 - 99 mg/dL 194(H)  BUN 6 - 20 mg/dL 15  Creatinine 0.61 - 1.24 mg/dL 1.15  Sodium 135 - 145 mmol/L 138  Potassium 3.5 - 5.1 mmol/L 4.7  Chloride 101 - 111 mmol/L 106  CO2 22 - 32 mmol/L 24  Calcium 8.9 - 10.3 mg/dL 9.1  Total Protein 6.5 - 8.1 g/dL 7.2  Total Bilirubin 0.3 - 1.2 mg/dL 0.4  Alkaline Phos 38 - 126 U/L 63  AST 15 - 41 U/L 24  ALT 17 - 63 U/L 14(L)   CBC Latest Ref Rng & Units 04/28/2017  WBC 3.8 - 10.6 K/uL 7.6  Hemoglobin 13.0 - 18.0 g/dL 13.2  Hematocrit 40.0 - 52.0 % 38.2(L)  Platelets 150 - 440 K/uL 274      Assessment and plan- Patient is a 76 y.o. male with stage II colon cancer T3N0M0 s/p surgery currently on adjuvant xeloda s/p 3 cycles  xeloda induced skin rash/ hand foot syndrome- continue topical steroids.  Will add 10% topical urea cream which he will apply to his hands and feet 2-3 times a day. Use protective socks and shoes while walking. If it is worse, will consider oral steroids  Given skin rash, will decrease the dose of xeloda from 4 tab BID to 3 tab BID. He was supposed to start this next week. However, he will hold drug for 1 more week. I will see him back in 10 days time prior to start of cycle 4 of xeloda to make sure skin rash is resolving. If it continues to persist/worsen, we may have to switch him to 5FU/LV for remaining cycles. Patients wishes to avoid IV chemotherapy if possible and would like to try lower dose of xeloda first   Visit Diagnosis 1. 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[place])   2. High risk medication use   3. Adverse effect of chemotherapy, initial encounter      Dr. Randa Evens, MD, MPH [facility] at [facility] Pager- [phone] 05/14/2017 10:38 AM

## 2017-05-14 NOTE — Progress Notes (Signed)
Patient states his hands, arms  and feet got red last Friday night.  Patient here today for assessment.

## 2017-05-19 ENCOUNTER — Other Ambulatory Visit: Payer: Medicare Other

## 2017-05-19 ENCOUNTER — Ambulatory Visit: Payer: Medicare Other | Admitting: Oncology

## 2017-05-26 ENCOUNTER — Encounter: Payer: Self-pay | Admitting: Oncology

## 2017-05-26 ENCOUNTER — Inpatient Hospital Stay (HOSPITAL_BASED_OUTPATIENT_CLINIC_OR_DEPARTMENT_OTHER): Payer: Medicare Other | Admitting: Oncology

## 2017-05-26 VITALS — BP 134/75 | HR 71 | Temp 97.9°F | Resp 18 | Wt 177.2 lb

## 2017-05-26 DIAGNOSIS — N189 Chronic kidney disease, unspecified: Secondary | ICD-10-CM | POA: Diagnosis not present

## 2017-05-26 DIAGNOSIS — Z933 Colostomy status: Secondary | ICD-10-CM | POA: Diagnosis not present

## 2017-05-26 DIAGNOSIS — Z7984 Long term (current) use of oral hypoglycemic drugs: Secondary | ICD-10-CM

## 2017-05-26 DIAGNOSIS — C187 Malignant neoplasm of sigmoid colon: Secondary | ICD-10-CM | POA: Diagnosis not present

## 2017-05-26 DIAGNOSIS — Z803 Family history of malignant neoplasm of breast: Secondary | ICD-10-CM | POA: Diagnosis not present

## 2017-05-26 DIAGNOSIS — Z85828 Personal history of other malignant neoplasm of skin: Secondary | ICD-10-CM

## 2017-05-26 DIAGNOSIS — Z79899 Other long term (current) drug therapy: Secondary | ICD-10-CM | POA: Diagnosis not present

## 2017-05-26 DIAGNOSIS — E785 Hyperlipidemia, unspecified: Secondary | ICD-10-CM

## 2017-05-26 DIAGNOSIS — L271 Localized skin eruption due to drugs and medicaments taken internally: Secondary | ICD-10-CM

## 2017-05-26 DIAGNOSIS — Z87442 Personal history of urinary calculi: Secondary | ICD-10-CM | POA: Diagnosis not present

## 2017-05-26 DIAGNOSIS — C189 Malignant neoplasm of colon, unspecified: Secondary | ICD-10-CM

## 2017-05-26 DIAGNOSIS — I129 Hypertensive chronic kidney disease with stage 1 through stage 4 chronic kidney disease, or unspecified chronic kidney disease: Secondary | ICD-10-CM | POA: Diagnosis not present

## 2017-05-26 NOTE — Progress Notes (Signed)
Here for follow up. Stated doing well

## 2017-05-26 NOTE — Progress Notes (Signed)
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 Patient is a 76 year old male who presented with evidence of bowel obstruction on 02/06/2017. At that time he had progressive abdominal distention and no bowel movement for about one week. CT abdomen showed an apple core lesion in the descending/sigmoid colon.  2. Patient underwent Hartmann's procedure for obstructing sigmoid colon mass on 02/06/2017. Preoperative CEA was 5.0  3. 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 It is 0 out of 15 lymph nodes were positive for malignancy. Perineural and lymphovascular invasion was present. Margins were negative.pT3N0. MMR stable.  4. He is here to discuss adjuvant treatment options. He lives alone. PMH significant for HTN and diabetes. He is independent of his ADL's and IADL's  5. Given that he had high risk stage II colon cancer including he presented with obstruction and has LVI and perineural invasion- adjuvant xeloda chemotherapy was recommended for 6 months.   6. Patient had evidence of hand foot syndrome after cycle 3. Cycle 4 delayed by 1 week. Topical urea cream prescribed   Interval history- he is doing well. Erythema in his forearms, hands and feet much improved. Denies "blister like feel" in his soles which he had in the past  ECOG PS- 0 Pain scale- 0   Review of systems- Review of Systems  Constitutional: Negative for chills, fever,  malaise/fatigue and weight loss.  HENT: Negative for congestion, ear discharge and nosebleeds.   Eyes: Negative for blurred vision.  Respiratory: Negative for cough, hemoptysis, sputum production, shortness of breath and wheezing.   Cardiovascular: Negative for chest pain, palpitations, orthopnea and claudication.  Gastrointestinal: Negative for abdominal pain, blood in stool, constipation, diarrhea, heartburn, melena, nausea and vomiting.  Genitourinary: Negative for dysuria, flank pain, frequency, hematuria and urgency.  Musculoskeletal: Negative for back pain, joint pain and myalgias.  Skin: Negative for rash.  Neurological: Negative for dizziness, tingling, focal weakness, seizures, weakness and headaches.  Endo/Heme/Allergies: Does not bruise/bleed easily.  Psychiatric/Behavioral: Negative for depression and suicidal ideas. The patient does not have insomnia.       No Known Allergies   Past Medical History:  Diagnosis Date  . Cancer ([place])    skin cancer-nose w graft,also shoulder- basal cell per pt  . Cataract    r eye  . Chronic kidney disease    kidney stones 20 y ago per pt  . Diabetes mellitus without complication ([place])   . Hyperlipidemia   . Hypertension      Past Surgical History:  Procedure Laterality Date  . COLECTOMY WITH COLOSTOMY CREATION/HARTMANN PROCEDURE N/A 02/06/2017   Procedure: COLECTOMY WITH COLOSTOMY CREATION/HARTMANN PROCEDURE;  Surgeon: [name], MD;  Location: [facility] ORS;  Service: General;  Laterality: N/A;  . CYSTOSCOPY    . FLEXIBLE SIGMOIDOSCOPY N/A 02/06/2017   Procedure: FLEXIBLE SIGMOIDOSCOPY;  Surgeon: Christene Lye, MD;  Location: [facility] ENDOSCOPY;  Service: Endoscopy;  Laterality: N/A;    Social History   Social History  . Marital status: Married    Spouse  name: N/A  . Number of children: N/A  . Years of education: N/A   Occupational History  . Not on file.   Social History Main Topics  . Smoking status: Never Smoker   . Smokeless tobacco: Never Used  . Alcohol use No  . Drug use: No  . Sexual activity: Not Currently   Other Topics Concern  . Not on file   Social History Narrative  . No narrative on file    Family History  Problem Relation Age of Onset  . Diabetes Mother   . AAA (abdominal aortic aneurysm) Mother   . Coronary artery disease Mother   . Diabetes Father   . Coronary artery disease Father   . Dementia Father   . Breast cancer Sister      Current Outpatient Prescriptions:  .  Apremilast (OTEZLA) 30 MG TABS, Take 1 tablet by mouth daily., Disp: , Rfl:  .  Blood Glucose Monitoring Suppl (GLUCOCOM BLOOD GLUCOSE MONITOR) DEVI, 1 Device by Other route every morning., Disp: , Rfl:  .  Blood Glucose Monitoring Suppl (ONETOUCH VERIO) w/Device KIT, 1 each by XX route as directed. Dx: E11.9, Disp: , Rfl:  .  capecitabine (XELODA) 500 MG tablet, Take 4 tablets (2,000 mg total) by mouth 2 (two) times daily after a meal. Take after food (within 30 minutes of a meal) with water for 14 days and then take 1 week off and then repeat cycle, Disp: 224 tablet, Rfl: 2 .  ferrous sulfate 324 (65 Fe) MG TBEC, Take 324 mg by mouth daily., Disp: , Rfl:  .  glimepiride (AMARYL) 2 MG tablet, Take 2 mg by mouth daily., Disp: , Rfl:  .  Lancets 30G MISC, Use 1 each as directed. Check CBG's fasting once daily. Dx: E11.9, Disp: , Rfl:  .  lisinopril (PRINIVIL,ZESTRIL) 5 MG tablet, Take 5 mg by mouth daily., Disp: , Rfl:  .  loratadine (CLARITIN) 10 MG tablet, Take 10 mg by mouth daily., Disp: , Rfl:  .  losartan (COZAAR) 25 MG tablet, Take 25 mg by mouth daily., Disp: , Rfl:  .  metFORMIN (GLUCOPHAGE) 1000 MG tablet, Take 1,000-1,500 tablets by mouth 2 (two) times daily. Take 1 tablet in AM and 1 1/2 tablets at supper, Disp: , Rfl:  .  pravastatin (PRAVACHOL) 20 MG tablet, Take 20 mg by mouth daily., Disp: , Rfl:  .  urea (CARMOL) 10 % cream, Apply topically as needed. Apply to hands and feet two to three times  a day as needed., Disp: 71 g, Rfl: 0 .  Docusate Sodium (DOCU PO), Take 1 capsule by mouth 2 (two) times daily., Disp: , Rfl:  .  ondansetron (ZOFRAN) 8 MG tablet, Take 1 tablet (8 mg total) by mouth 2 (two) times daily as needed (Nausea or vomiting). (Patient not taking: Reported on 05/26/2017), Disp: 30 tablet, Rfl: 1 .  prochlorperazine (COMPAZINE) 10 MG tablet, Take 1 tablet (10 mg total) by mouth every 6 (six) hours as needed (Nausea or vomiting). (Patient not taking: Reported on 05/26/2017), Disp: 30 tablet, Rfl: 1  Physical exam:  Vitals:   05/26/17 1507  BP: 134/75  Pulse: 71  Resp: 18  Temp: 97.9 F (36.6 C)  TempSrc: Tympanic  Weight: 177 lb 3.2 oz (80.4 kg)   Physical Exam  Constitutional: He is oriented to person, place, and time and well-developed, well-nourished, and in no distress.  HENT:  Head: Normocephalic and atraumatic.  Eyes: Pupils are equal, round, and reactive  to light. EOM are normal.  Neck: Normal range of motion.  Cardiovascular: Normal rate, regular rhythm and normal heart sounds.   Pulmonary/Chest: Effort normal and breath sounds normal.  Abdominal: Soft. Bowel sounds are normal.  Colostomy in place  Neurological: He is alert and oriented to person, place, and time.  Skin: Skin is warm and dry.  Erythema in b/l hands and soles of feet much improved. Erythematous lesions over forearms also look better     CMP Latest Ref Rng & Units 04/28/2017  Glucose 65 - 99 mg/dL 194(H)  BUN 6 - 20 mg/dL 15  Creatinine 0.61 - 1.24 mg/dL 1.15  Sodium 135 - 145 mmol/L 138  Potassium 3.5 - 5.1 mmol/L 4.7  Chloride 101 - 111 mmol/L 106  CO2 22 - 32 mmol/L 24  Calcium 8.9 - 10.3 mg/dL 9.1  Total Protein 6.5 - 8.1 g/dL 7.2  Total Bilirubin 0.3 - 1.2 mg/dL 0.4  Alkaline Phos 38 - 126 U/L 63  AST 15 - 41 U/L 24  ALT 17 - 63 U/L 14(L)   CBC Latest Ref Rng & Units 04/28/2017  WBC 3.8 - 10.6 K/uL 7.6  Hemoglobin 13.0 - 18.0 g/dL 13.2  Hematocrit 40.0 - 52.0 % 38.2(L)    Platelets 150 - 440 K/uL 274    Assessment and plan- Patient is a 76 y.o. male with stage II colon cancer T3N0M0 s/p surgery currently on adjuvant xeloda s/p 3 cycles  Hand foot syndrome due to xeloda much improved. Ok to resume cycle 4. We will attempt 4 tab BID. If rash worsens, he will back down to 3 tab BID. Continue topical urea application to hands and feet. Protective clothing to cover forearms on exposure to sunlight  I will see him back in 3 weeks prior to cycle 5 of xeloda   Visit Diagnosis 1. 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[place])   2. High risk medication use      Dr. Randa Evens, MD, MPH [facility] Pager- [phone] 05/26/2017 3:42 PM

## 2017-06-15 NOTE — Progress Notes (Signed)
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 Patient is a 76 year old male who presented with evidence of bowel obstruction on 02/06/2017. At that time he had progressive abdominal distention and no bowel movement for about one week. CT abdomen showed an apple core lesion in the descending/sigmoid colon.  2. Patient underwent Hartmann's procedure for obstructing sigmoid colon mass on 02/06/2017. Preoperative CEA was 5.0  3. 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 It is 0 out of 15 lymph nodes were positive for malignancy. Perineural and lymphovascular invasion was present. Margins were negative.pT3N0. MMR stable.  4. Given that he had high risk stage II colon cancer including he presented with obstruction and has LVI and perineural invasion- adjuvant xeloda chemotherapy was recommended for 6 months.   5. Patient had evidence of hand foot syndrome after cycle 3. Cycle 4 delayed by 1 week. Topical urea cream prescribed   Interval history- he has tolerated 4 tab BID well. Mild erythema in his feet and hands. They do not feel sensitive. He has been applying topical urea cream. Forearm rash is also improved  ECOG PS- 0 Pain scale- 0   Review of systems- Review of Systems  Constitutional: Negative for chills, fever, malaise/fatigue and weight loss.  HENT: Negative for congestion, ear discharge and nosebleeds.   Eyes: Negative for blurred vision.  Respiratory: Negative  for cough, hemoptysis, sputum production, shortness of breath and wheezing.   Cardiovascular: Negative for chest pain, palpitations, orthopnea and claudication.  Gastrointestinal: Negative for abdominal pain, blood in stool, constipation, diarrhea, heartburn, melena, nausea and vomiting.  Genitourinary: Negative for dysuria, flank pain, frequency, hematuria and urgency.  Musculoskeletal: Negative for back pain, joint pain and myalgias.  Skin: Positive for rash.  Neurological: Negative for dizziness, tingling, focal weakness, seizures, weakness and headaches.  Endo/Heme/Allergies: Does not bruise/bleed easily.  Psychiatric/Behavioral: Negative for depression and suicidal ideas. The patient does not have insomnia.       No Known Allergies   Past Medical History:  Diagnosis Date  . Cancer ([place])    skin cancer-nose w graft,also shoulder- basal cell per pt  . Cataract    r eye  . Chronic kidney disease    kidney stones 20 y ago per pt  . Diabetes mellitus without complication ([place])   . Hyperlipidemia   . Hypertension      Past Surgical History:  Procedure Laterality Date  . COLECTOMY WITH COLOSTOMY CREATION/HARTMANN PROCEDURE N/A 02/06/2017   Procedure: COLECTOMY WITH COLOSTOMY CREATION/HARTMANN PROCEDURE;  Surgeon: [name], MD;  Location: [facility] ORS;  Service: General;  Laterality: N/A;  . CYSTOSCOPY    . FLEXIBLE SIGMOIDOSCOPY N/A 02/06/2017   Procedure: FLEXIBLE SIGMOIDOSCOPY;  Surgeon: Christene Lye, MD;  Location: [facility] ENDOSCOPY;  Service: Endoscopy;  Laterality: N/A;    Social History   Social History  . Marital status: Married    Spouse name: N/A  . Number of children: N/A  . Years of education: N/A   Occupational History  . Not on file.   Social History Main Topics  .  Smoking status: Never Smoker  . Smokeless tobacco: Never Used  . Alcohol use No  . Drug use: No  . Sexual activity: Not Currently   Other Topics Concern  . Not on file   Social  History Narrative  . No narrative on file    Family History  Problem Relation Age of Onset  . Diabetes Mother   . AAA (abdominal aortic aneurysm) Mother   . Coronary artery disease Mother   . Diabetes Father   . Coronary artery disease Father   . Dementia Father   . Breast cancer Sister      Current Outpatient Prescriptions:  .  Apremilast (OTEZLA) 30 MG TABS, Take 1 tablet by mouth daily., Disp: , Rfl:  .  Blood Glucose Monitoring Suppl (GLUCOCOM BLOOD GLUCOSE MONITOR) DEVI, 1 Device by Other route every morning., Disp: , Rfl:  .  Blood Glucose Monitoring Suppl (ONETOUCH VERIO) w/Device KIT, 1 each by XX route as directed. Dx: E11.9, Disp: , Rfl:  .  capecitabine (XELODA) 500 MG tablet, Take 4 tablets (2,000 mg total) by mouth 2 (two) times daily after a meal. Take after food (within 30 minutes of a meal) with water for 14 days and then take 1 week off and then repeat cycle, Disp: 224 tablet, Rfl: 2 .  Docusate Sodium (DOCU PO), Take 1 capsule by mouth 2 (two) times daily., Disp: , Rfl:  .  ferrous sulfate 324 (65 Fe) MG TBEC, Take 324 mg by mouth daily., Disp: , Rfl:  .  glimepiride (AMARYL) 2 MG tablet, Take 2 mg by mouth daily., Disp: , Rfl:  .  Lancets 30G MISC, Use 1 each as directed. Check CBG's fasting once daily. Dx: E11.9, Disp: , Rfl:  .  lisinopril (PRINIVIL,ZESTRIL) 5 MG tablet, Take 5 mg by mouth daily., Disp: , Rfl:  .  loratadine (CLARITIN) 10 MG tablet, Take 10 mg by mouth daily., Disp: , Rfl:  .  losartan (COZAAR) 25 MG tablet, Take 25 mg by mouth daily., Disp: , Rfl:  .  metFORMIN (GLUCOPHAGE) 1000 MG tablet, Take 1,000-1,500 tablets by mouth 2 (two) times daily. Take 1 tablet in AM and 1 1/2 tablets at supper, Disp: , Rfl:  .  ondansetron (ZOFRAN) 8 MG tablet, Take 1 tablet (8 mg total) by mouth 2 (two) times daily as needed (Nausea or vomiting). (Patient not taking: Reported on 05/26/2017), Disp: 30 tablet, Rfl: 1 .  pravastatin (PRAVACHOL) 20 MG tablet, Take 20 mg  by mouth daily., Disp: , Rfl:  .  prochlorperazine (COMPAZINE) 10 MG tablet, Take 1 tablet (10 mg total) by mouth every 6 (six) hours as needed (Nausea or vomiting). (Patient not taking: Reported on 05/26/2017), Disp: 30 tablet, Rfl: 1 .  urea (CARMOL) 10 % cream, Apply topically as needed. Apply to hands and feet two to three times a day as needed., Disp: 71 g, Rfl: 0  Physical exam:  Vitals:   06/16/17 1110  BP: 119/76  Pulse: 79  Resp: 18  Temp: 98.2 F (36.8 C)  TempSrc: Oral  Weight: 180 lb 3.2 oz (81.7 kg)  Height: 5' (1.524 m)   Physical Exam  Constitutional: He is oriented to person, place, and time and well-developed, well-nourished, and in no distress.  HENT:  Head: Normocephalic and atraumatic.  Eyes: Pupils are equal, round, and reactive to light. EOM are normal.  Neck: Normal range of motion.  Cardiovascular: Normal rate, regular rhythm and normal heart sounds.   Pulmonary/Chest: Effort normal  and breath sounds normal.  Abdominal: Soft. Bowel sounds are normal.  Colostomy in place  Neurological: He is alert and oriented to person, place, and time.  Skin: Skin is warm and dry.  Mild erythema in his feet. Stable. No exfoliation. Forearm lesions much improved     CMP Latest Ref Rng & Units 06/16/2017  Glucose 65 - 99 mg/dL 186(H)  BUN 6 - 20 mg/dL 16  Creatinine 0.61 - 1.24 mg/dL 1.24  Sodium 135 - 145 mmol/L 135  Potassium 3.5 - 5.1 mmol/L 4.2  Chloride 101 - 111 mmol/L 102  CO2 22 - 32 mmol/L 21(L)  Calcium 8.9 - 10.3 mg/dL 9.5  Total Protein 6.5 - 8.1 g/dL 7.1  Total Bilirubin 0.3 - 1.2 mg/dL 0.5  Alkaline Phos 38 - 126 U/L 74  AST 15 - 41 U/L 25  ALT 17 - 63 U/L 14(L)   CBC Latest Ref Rng & Units 06/16/2017  WBC 3.8 - 10.6 K/uL 7.7  Hemoglobin 13.0 - 18.0 g/dL 13.8  Hematocrit 40.0 - 52.0 % 40.0  Platelets 150 - 440 K/uL 272    Assessment and plan- Patient is a 76 y.o. male with stage II colon cancer T3N0M0 s/p surgery currently on adjuvant xeloda s/p  4cycles  Counts ok to proceed with cycle 5 of xeloda.  He has not had a flare up on his hand foot syndrome. Continue topical urea cream. Dose will remain the same at 4 tab BID. rtc in 2 weeks for md assessment prior to cycle 6 of xeloda. He plans to go to beach 3 weeks from now. therefore DR. Tasia Catchings will see him in  2 weeks prior to start of cycle 6 and I will see him prior to cycle 7.  Plan is to complete 8 cycles followed by colostomy take down and repeat scans   Visit Diagnosis 1. 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[place])   2. High risk medication use      Dr. Randa Evens, MD, MPH [facility] Pager- [phone] 06/16/2017 11:32 AM

## 2017-06-16 ENCOUNTER — Inpatient Hospital Stay: Payer: Medicare Other

## 2017-06-16 ENCOUNTER — Encounter: Payer: Self-pay | Admitting: Oncology

## 2017-06-16 ENCOUNTER — Inpatient Hospital Stay: Payer: Medicare Other | Attending: Oncology | Admitting: Oncology

## 2017-06-16 VITALS — BP 119/76 | HR 79 | Temp 98.2°F | Resp 18 | Ht 60.0 in | Wt 180.2 lb

## 2017-06-16 DIAGNOSIS — N189 Chronic kidney disease, unspecified: Secondary | ICD-10-CM | POA: Insufficient documentation

## 2017-06-16 DIAGNOSIS — E785 Hyperlipidemia, unspecified: Secondary | ICD-10-CM | POA: Diagnosis not present

## 2017-06-16 DIAGNOSIS — C189 Malignant neoplasm of colon, unspecified: Secondary | ICD-10-CM

## 2017-06-16 DIAGNOSIS — Z7984 Long term (current) use of oral hypoglycemic drugs: Secondary | ICD-10-CM | POA: Insufficient documentation

## 2017-06-16 DIAGNOSIS — Z87442 Personal history of urinary calculi: Secondary | ICD-10-CM | POA: Diagnosis not present

## 2017-06-16 DIAGNOSIS — I129 Hypertensive chronic kidney disease with stage 1 through stage 4 chronic kidney disease, or unspecified chronic kidney disease: Secondary | ICD-10-CM | POA: Insufficient documentation

## 2017-06-16 DIAGNOSIS — C187 Malignant neoplasm of sigmoid colon: Secondary | ICD-10-CM | POA: Insufficient documentation

## 2017-06-16 DIAGNOSIS — Z933 Colostomy status: Secondary | ICD-10-CM | POA: Diagnosis not present

## 2017-06-16 DIAGNOSIS — E1122 Type 2 diabetes mellitus with diabetic chronic kidney disease: Secondary | ICD-10-CM

## 2017-06-16 DIAGNOSIS — Z85828 Personal history of other malignant neoplasm of skin: Secondary | ICD-10-CM | POA: Insufficient documentation

## 2017-06-16 DIAGNOSIS — Z803 Family history of malignant neoplasm of breast: Secondary | ICD-10-CM | POA: Insufficient documentation

## 2017-06-16 DIAGNOSIS — Z79899 Other long term (current) drug therapy: Secondary | ICD-10-CM | POA: Insufficient documentation

## 2017-06-16 LAB — COMPREHENSIVE METABOLIC PANEL
ALT: 14 U/L — ABNORMAL LOW (ref 17–63)
ANION GAP: 12 (ref 5–15)
AST: 25 U/L (ref 15–41)
Albumin: 4.2 g/dL (ref 3.5–5.0)
Alkaline Phosphatase: 74 U/L (ref 38–126)
BUN: 16 mg/dL (ref 6–20)
CO2: 21 mmol/L — AB (ref 22–32)
CREATININE: 1.24 mg/dL (ref 0.61–1.24)
Calcium: 9.5 mg/dL (ref 8.9–10.3)
Chloride: 102 mmol/L (ref 101–111)
GFR, EST NON AFRICAN AMERICAN: 54 mL/min — AB (ref >60)
Glucose, Bld: 186 mg/dL — ABNORMAL HIGH (ref 65–99)
POTASSIUM: 4.2 mmol/L (ref 3.5–5.1)
SODIUM: 135 mmol/L (ref 135–145)
Total Bilirubin: 0.5 mg/dL (ref 0.3–1.2)
Total Protein: 7.1 g/dL (ref 6.5–8.1)

## 2017-06-16 LAB — CBC WITH DIFFERENTIAL/PLATELET
Basophils Absolute: 0 10*3/uL (ref 0–0.1)
Basophils Relative: 1 %
EOS ABS: 0.3 10*3/uL (ref 0–0.7)
Eosinophils Relative: 4 %
HCT: 40 % (ref 40.0–52.0)
Hemoglobin: 13.8 g/dL (ref 13.0–18.0)
LYMPHS ABS: 2.4 10*3/uL (ref 1.0–3.6)
LYMPHS PCT: 32 %
MCH: 32.3 pg (ref 26.0–34.0)
MCHC: 34.6 g/dL (ref 32.0–36.0)
MCV: 93.2 fL (ref 80.0–100.0)
MONO ABS: 0.6 10*3/uL (ref 0.2–1.0)
Monocytes Relative: 8 %
Neutro Abs: 4.4 10*3/uL (ref 1.4–6.5)
Neutrophils Relative %: 55 %
PLATELETS: 272 10*3/uL (ref 150–440)
RBC: 4.29 MIL/uL — ABNORMAL LOW (ref 4.40–5.90)
RDW: 20.9 % — AB (ref 11.5–14.5)
WBC: 7.7 10*3/uL (ref 3.8–10.6)

## 2017-06-16 NOTE — Progress Notes (Signed)
Pt had redness and tenderness of hand and feet from last time the pt saw md. Today feet is brown color and not tenderness.

## 2017-06-26 ENCOUNTER — Telehealth: Payer: Self-pay | Admitting: *Deleted

## 2017-06-26 NOTE — Telephone Encounter (Signed)
Pt came in and needed more supplies for his colostomy.  When I saw him he did not need supply for colostomy it was how the bag lays it causes skin breakdown and the colostomy GI nurse suggested to him to put thin duoderm over that area and he just needed another one.  He was given a duoderm. He then said that he is on his last refill of xeloda.  I checked with pharmacy and with how many cycles pt will need and he needed 2 refills and it was called into [facility] with permission or MD on call.

## 2017-06-29 NOTE — Progress Notes (Addendum)
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 Patient is a 76 year old male who presented with evidence of bowel obstruction on 02/06/2017. At that time he had progressive abdominal distention and no bowel movement for about one week. CT abdomen showed an apple core lesion in the descending/sigmoid colon.  2. Patient underwent Hartmann's procedure for obstructing sigmoid colon mass on 02/06/2017. Preoperative CEA was 5.0  3. 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 It is 0 out of 15 lymph nodes were positive for malignancy. Perineural and lymphovascular invasion was present. Margins were negative.pT3N0. MMR stable.  4. Given that he had high risk stage II colon cancer including he presented with obstruction and has LVI and perineural invasion- adjuvant xeloda chemotherapy was recommended for 6 months.   5. Patient had evidence of hand foot syndrome after cycle 3. Cycle 4 delayed by 1 week. Topical urea cream prescribed   Interval history- he has tolerated 4 tab BID well. Today's his last day of Xeloda for current cycle. He has had mild erythema in his feet and hands. They do not feel sensitive. He has been applying topical urea cream. He plans to spend some time on the beach next week and come back after Labor Day weekend. Denies diarrhea, abdominal pain, fever/chills, chest pain, shortness of breath.  ECOG PS- 0 Pain scale- 0   Review of systems- Review of Systems    Constitutional: Negative for chills, fever, malaise/fatigue and weight loss.  HENT: Negative for congestion, ear discharge and nosebleeds.   Eyes: Negative for blurred vision.  Respiratory: Negative for cough, hemoptysis, sputum production, shortness of breath and wheezing.   Cardiovascular: Negative for chest pain, palpitations, orthopnea and claudication.  Gastrointestinal: Negative for abdominal pain, blood in stool, constipation, diarrhea, heartburn, melena, nausea and vomiting.  Genitourinary: Negative for dysuria, flank pain, frequency, hematuria and urgency.  Musculoskeletal: Negative for back pain, joint pain and myalgias.  Skin: Negative.  Negative for rash.  Neurological: Negative for dizziness, tingling, focal weakness, seizures, weakness and headaches.  Endo/Heme/Allergies: Does not bruise/bleed easily.  Psychiatric/Behavioral: Negative for depression and suicidal ideas. The patient does not have insomnia.       No Known Allergies   Past Medical History:  Diagnosis Date  . Cancer ([place])    skin cancer-nose w graft,also shoulder- basal cell per pt  . Cataract    r eye  . Chronic kidney disease    kidney stones 20 y ago per pt  . Colon cancer ([place])   . Diabetes mellitus without complication ([place])   . Hyperlipidemia   . Hypertension      Past Surgical History:  Procedure Laterality Date  . COLECTOMY WITH COLOSTOMY CREATION/HARTMANN PROCEDURE N/A 02/06/2017   Procedure: COLECTOMY WITH COLOSTOMY CREATION/HARTMANN PROCEDURE;  Surgeon: [name], MD;  Location: [facility] ORS;  Service: General;  Laterality: N/A;  . CYSTOSCOPY    . FLEXIBLE SIGMOIDOSCOPY N/A 02/06/2017   Procedure: FLEXIBLE SIGMOIDOSCOPY;  Surgeon: Christene Lye, MD;  Location: [facility] ENDOSCOPY;  Service: Endoscopy;  Laterality: N/A;  Social History   Social History  . Marital status: Married    Spouse name: N/A  . Number of children: N/A  . Years of education: N/A   Occupational History   . Not on file.   Social History Main Topics  . Smoking status: Never Smoker  . Smokeless tobacco: Never Used  . Alcohol use No  . Drug use: No  . Sexual activity: Not Currently   Other Topics Concern  . Not on file   Social History Narrative  . No narrative on file    Family History  Problem Relation Age of Onset  . Diabetes Mother   . AAA (abdominal aortic aneurysm) Mother   . Coronary artery disease Mother   . Diabetes Father   . Coronary artery disease Father   . Dementia Father   . Breast cancer Sister      Current Outpatient Prescriptions:  .  Apremilast (OTEZLA) 30 MG TABS, Take 1 tablet by mouth daily., Disp: , Rfl:  .  Blood Glucose Monitoring Suppl (GLUCOCOM BLOOD GLUCOSE MONITOR) DEVI, 1 Device by Other route every morning., Disp: , Rfl:  .  Blood Glucose Monitoring Suppl (ONETOUCH VERIO) w/Device KIT, 1 each by XX route as directed. Dx: E11.9, Disp: , Rfl:  .  capecitabine (XELODA) 500 MG tablet, Take 4 tablets (2,000 mg total) by mouth 2 (two) times daily after a meal. Take after food (within 30 minutes of a meal) with water for 14 days and then take 1 week off and then repeat cycle, Disp: 224 tablet, Rfl: 2 .  ferrous sulfate 324 (65 Fe) MG TBEC, Take 324 mg by mouth daily., Disp: , Rfl:  .  glimepiride (AMARYL) 2 MG tablet, Take 2 mg by mouth daily., Disp: , Rfl:  .  Lancets 30G MISC, Use 1 each as directed. Check CBG's fasting once daily. Dx: E11.9, Disp: , Rfl:  .  lisinopril (PRINIVIL,ZESTRIL) 5 MG tablet, Take 5 mg by mouth daily., Disp: , Rfl:  .  loperamide (IMODIUM A-D) 2 MG tablet, Take 2 mg by mouth 4 (four) times daily as needed for diarrhea or loose stools., Disp: , Rfl:  .  loratadine (CLARITIN) 10 MG tablet, Take 10 mg by mouth daily., Disp: , Rfl:  .  losartan (COZAAR) 25 MG tablet, Take 25 mg by mouth daily., Disp: , Rfl:  .  metFORMIN (GLUCOPHAGE) 1000 MG tablet, Take 1,000-1,500 tablets by mouth 2 (two) times daily. Take 1 tablet in AM and 1  1/2 tablets at supper, Disp: , Rfl:  .  ondansetron (ZOFRAN) 8 MG tablet, Take 1 tablet (8 mg total) by mouth 2 (two) times daily as needed (Nausea or vomiting)., Disp: 30 tablet, Rfl: 1 .  pravastatin (PRAVACHOL) 20 MG tablet, Take 20 mg by mouth daily., Disp: , Rfl:  .  prochlorperazine (COMPAZINE) 10 MG tablet, Take 1 tablet (10 mg total) by mouth every 6 (six) hours as needed (Nausea or vomiting)., Disp: 30 tablet, Rfl: 1 .  urea (CARMOL) 10 % cream, Apply topically as needed. Apply to hands and feet two to three times a day as needed., Disp: 71 g, Rfl: 0  Physical exam:  Vitals:   06/30/17 1117  BP: (!) 143/74  Pulse: 72  Resp: 18  Temp: (!) 96.6 F (35.9 C)  TempSrc: Tympanic  Weight: 182 lb 8 oz (82.8 kg)   Physical Exam  Constitutional: He is oriented to person, place, and time and well-developed, well-nourished, and in no distress.  HENT:  Head: Normocephalic and atraumatic.  Eyes: Pupils are equal, round, and reactive to light. EOM are normal.  Neck: Normal range of motion.  Cardiovascular: Normal rate, regular rhythm and normal heart sounds.   Pulmonary/Chest: Effort normal and breath sounds normal.  Abdominal: Soft. Bowel sounds are normal.  Colostomy in place  Neurological: He is alert and oriented to person, place, and time.  Skin: Skin is warm and dry.  Mild erythema in his feet. Stable. No exfoliation. Forearm lesions much improved     CMP Latest Ref Rng & Units 06/16/2017  Glucose 65 - 99 mg/dL 186(H)  BUN 6 - 20 mg/dL 16  Creatinine 0.61 - 1.24 mg/dL 1.24  Sodium 135 - 145 mmol/L 135  Potassium 3.5 - 5.1 mmol/L 4.2  Chloride 101 - 111 mmol/L 102  CO2 22 - 32 mmol/L 21(L)  Calcium 8.9 - 10.3 mg/dL 9.5  Total Protein 6.5 - 8.1 g/dL 7.1  Total Bilirubin 0.3 - 1.2 mg/dL 0.5  Alkaline Phos 38 - 126 U/L 74  AST 15 - 41 U/L 25  ALT 17 - 63 U/L 14(L)   CBC Latest Ref Rng & Units 06/16/2017  WBC 3.8 - 10.6 K/uL 7.7  Hemoglobin 13.0 - 18.0 g/dL 13.8  Hematocrit  40.0 - 52.0 % 40.0  Platelets 150 - 440 K/uL 272    Assessment and plan- Patient is a 77 y.o. male with stage II colon cancer T3N0M0 s/p surgery currently on adjuvant xeloda s/p 5cycles  He appears tolerating the treatment. He has not had a flare up on his hand foot syndrome. Continue topical urea cream. Dose will remain the same at 4 tab BID. rtc in 2 weeks for md assessment prior to cycle 6 of xeloda. He plans to go to beach 3 weeks from now.OK to proceed to cycle 6. Dr.raoI will see him prior to cycle 7.  Plan is to complete 8 cycles followed by colostomy take down and repeat scans   Visit Diagnosis 1. 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[place])    Total face to face encounter time for this patient visit was 25 min. >50% of the time was  spent in counseling and coordination of care.    Dr. Earlie Server, MD, PhD [facility] at [facility] Pager- [phone] 06/29/2017

## 2017-06-30 ENCOUNTER — Inpatient Hospital Stay: Payer: Medicare Other

## 2017-06-30 ENCOUNTER — Inpatient Hospital Stay (HOSPITAL_BASED_OUTPATIENT_CLINIC_OR_DEPARTMENT_OTHER): Payer: Medicare Other | Admitting: Oncology

## 2017-06-30 VITALS — BP 143/74 | HR 72 | Temp 96.6°F | Resp 18 | Wt 182.5 lb

## 2017-06-30 DIAGNOSIS — C189 Malignant neoplasm of colon, unspecified: Secondary | ICD-10-CM

## 2017-06-30 DIAGNOSIS — E785 Hyperlipidemia, unspecified: Secondary | ICD-10-CM

## 2017-06-30 DIAGNOSIS — Z79899 Other long term (current) drug therapy: Secondary | ICD-10-CM | POA: Diagnosis not present

## 2017-06-30 DIAGNOSIS — Z85828 Personal history of other malignant neoplasm of skin: Secondary | ICD-10-CM

## 2017-06-30 DIAGNOSIS — I129 Hypertensive chronic kidney disease with stage 1 through stage 4 chronic kidney disease, or unspecified chronic kidney disease: Secondary | ICD-10-CM | POA: Diagnosis not present

## 2017-06-30 DIAGNOSIS — Z7984 Long term (current) use of oral hypoglycemic drugs: Secondary | ICD-10-CM

## 2017-06-30 DIAGNOSIS — C187 Malignant neoplasm of sigmoid colon: Secondary | ICD-10-CM

## 2017-06-30 DIAGNOSIS — E1122 Type 2 diabetes mellitus with diabetic chronic kidney disease: Secondary | ICD-10-CM | POA: Diagnosis not present

## 2017-06-30 DIAGNOSIS — Z933 Colostomy status: Secondary | ICD-10-CM | POA: Diagnosis not present

## 2017-06-30 DIAGNOSIS — Z803 Family history of malignant neoplasm of breast: Secondary | ICD-10-CM | POA: Diagnosis not present

## 2017-06-30 DIAGNOSIS — N189 Chronic kidney disease, unspecified: Secondary | ICD-10-CM | POA: Diagnosis not present

## 2017-06-30 DIAGNOSIS — Z87442 Personal history of urinary calculi: Secondary | ICD-10-CM | POA: Diagnosis not present

## 2017-06-30 LAB — COMPREHENSIVE METABOLIC PANEL
ALT: 18 U/L (ref 17–63)
ANION GAP: 10 (ref 5–15)
AST: 26 U/L (ref 15–41)
Albumin: 4.3 g/dL (ref 3.5–5.0)
Alkaline Phosphatase: 66 U/L (ref 38–126)
BUN: 22 mg/dL — ABNORMAL HIGH (ref 6–20)
CALCIUM: 9.4 mg/dL (ref 8.9–10.3)
CHLORIDE: 106 mmol/L (ref 101–111)
CO2: 19 mmol/L — ABNORMAL LOW (ref 22–32)
CREATININE: 1.29 mg/dL — AB (ref 0.61–1.24)
GFR, EST NON AFRICAN AMERICAN: 52 mL/min — AB (ref >60)
Glucose, Bld: 192 mg/dL — ABNORMAL HIGH (ref 65–99)
Potassium: 3.8 mmol/L (ref 3.5–5.1)
Sodium: 135 mmol/L (ref 135–145)
Total Bilirubin: 0.6 mg/dL (ref 0.3–1.2)
Total Protein: 7.3 g/dL (ref 6.5–8.1)

## 2017-06-30 LAB — CBC WITH DIFFERENTIAL/PLATELET
Basophils Absolute: 0.1 10*3/uL (ref 0–0.1)
Basophils Relative: 1 %
EOS PCT: 3 %
Eosinophils Absolute: 0.3 10*3/uL (ref 0–0.7)
HCT: 38.3 % — ABNORMAL LOW (ref 40.0–52.0)
Hemoglobin: 13.2 g/dL (ref 13.0–18.0)
LYMPHS PCT: 42 %
Lymphs Abs: 3.2 10*3/uL (ref 1.0–3.6)
MCH: 32.8 pg (ref 26.0–34.0)
MCHC: 34.6 g/dL (ref 32.0–36.0)
MCV: 94.8 fL (ref 80.0–100.0)
MONO ABS: 0.6 10*3/uL (ref 0.2–1.0)
Monocytes Relative: 8 %
NEUTROS PCT: 46 %
Neutro Abs: 3.6 10*3/uL (ref 1.4–6.5)
PLATELETS: 251 10*3/uL (ref 150–440)
RBC: 4.04 MIL/uL — AB (ref 4.40–5.90)
RDW: 20 % — ABNORMAL HIGH (ref 11.5–14.5)
WBC: 7.8 10*3/uL (ref 3.8–10.6)

## 2017-06-30 NOTE — Progress Notes (Signed)
Here for follow up. Doing great he stated.

## 2017-07-28 ENCOUNTER — Inpatient Hospital Stay: Payer: Medicare Other

## 2017-07-28 ENCOUNTER — Inpatient Hospital Stay: Payer: Medicare Other | Attending: Oncology | Admitting: Oncology

## 2017-07-28 ENCOUNTER — Encounter: Payer: Self-pay | Admitting: Oncology

## 2017-07-28 VITALS — BP 138/71 | HR 71 | Temp 97.2°F | Resp 18 | Wt 186.8 lb

## 2017-07-28 DIAGNOSIS — E1122 Type 2 diabetes mellitus with diabetic chronic kidney disease: Secondary | ICD-10-CM | POA: Diagnosis not present

## 2017-07-28 DIAGNOSIS — I129 Hypertensive chronic kidney disease with stage 1 through stage 4 chronic kidney disease, or unspecified chronic kidney disease: Secondary | ICD-10-CM | POA: Insufficient documentation

## 2017-07-28 DIAGNOSIS — Z85828 Personal history of other malignant neoplasm of skin: Secondary | ICD-10-CM | POA: Diagnosis not present

## 2017-07-28 DIAGNOSIS — Z933 Colostomy status: Secondary | ICD-10-CM | POA: Diagnosis not present

## 2017-07-28 DIAGNOSIS — C189 Malignant neoplasm of colon, unspecified: Secondary | ICD-10-CM

## 2017-07-28 DIAGNOSIS — Z79899 Other long term (current) drug therapy: Secondary | ICD-10-CM | POA: Insufficient documentation

## 2017-07-28 DIAGNOSIS — C187 Malignant neoplasm of sigmoid colon: Secondary | ICD-10-CM | POA: Insufficient documentation

## 2017-07-28 DIAGNOSIS — Z803 Family history of malignant neoplasm of breast: Secondary | ICD-10-CM | POA: Diagnosis not present

## 2017-07-28 DIAGNOSIS — Z87442 Personal history of urinary calculi: Secondary | ICD-10-CM | POA: Insufficient documentation

## 2017-07-28 DIAGNOSIS — Z7984 Long term (current) use of oral hypoglycemic drugs: Secondary | ICD-10-CM | POA: Diagnosis not present

## 2017-07-28 DIAGNOSIS — L271 Localized skin eruption due to drugs and medicaments taken internally: Secondary | ICD-10-CM | POA: Diagnosis not present

## 2017-07-28 DIAGNOSIS — E785 Hyperlipidemia, unspecified: Secondary | ICD-10-CM | POA: Diagnosis not present

## 2017-07-28 LAB — CBC WITH DIFFERENTIAL/PLATELET
BASOS PCT: 1 %
Basophils Absolute: 0.1 10*3/uL (ref 0–0.1)
EOS ABS: 0.4 10*3/uL (ref 0–0.7)
EOS PCT: 6 %
HCT: 37.6 % — ABNORMAL LOW (ref 40.0–52.0)
Hemoglobin: 13.1 g/dL (ref 13.0–18.0)
LYMPHS ABS: 2.5 10*3/uL (ref 1.0–3.6)
Lymphocytes Relative: 36 %
MCH: 34.5 pg — AB (ref 26.0–34.0)
MCHC: 34.7 g/dL (ref 32.0–36.0)
MCV: 99.3 fL (ref 80.0–100.0)
Monocytes Absolute: 0.6 10*3/uL (ref 0.2–1.0)
Monocytes Relative: 9 %
Neutro Abs: 3.4 10*3/uL (ref 1.4–6.5)
Neutrophils Relative %: 48 %
PLATELETS: 243 10*3/uL (ref 150–440)
RBC: 3.78 MIL/uL — ABNORMAL LOW (ref 4.40–5.90)
RDW: 19.7 % — ABNORMAL HIGH (ref 11.5–14.5)
WBC: 6.9 10*3/uL (ref 3.8–10.6)

## 2017-07-28 LAB — COMPREHENSIVE METABOLIC PANEL
ALBUMIN: 4 g/dL (ref 3.5–5.0)
ALT: 15 U/L — AB (ref 17–63)
ANION GAP: 8 (ref 5–15)
AST: 23 U/L (ref 15–41)
Alkaline Phosphatase: 63 U/L (ref 38–126)
BUN: 17 mg/dL (ref 6–20)
CHLORIDE: 104 mmol/L (ref 101–111)
CO2: 24 mmol/L (ref 22–32)
CREATININE: 1.18 mg/dL (ref 0.61–1.24)
Calcium: 8.9 mg/dL (ref 8.9–10.3)
GFR calc non Af Amer: 58 mL/min — ABNORMAL LOW (ref >60)
GLUCOSE: 184 mg/dL — AB (ref 65–99)
Potassium: 4.1 mmol/L (ref 3.5–5.1)
SODIUM: 136 mmol/L (ref 135–145)
Total Bilirubin: 0.7 mg/dL (ref 0.3–1.2)
Total Protein: 6.9 g/dL (ref 6.5–8.1)

## 2017-07-28 MED ORDER — UREA 10 % EX CREA: TOPICAL_CREAM | CUTANEOUS | 0 refills | Status: DC | PRN

## 2017-07-28 NOTE — Progress Notes (Signed)
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 Patient is a 76 year old male who presented with evidence of bowel obstruction on 02/06/2017. At that time he had progressive abdominal distention and no bowel movement for about one week. CT abdomen showed an apple core lesion in the descending/sigmoid colon.  2. Patient underwent Hartmann's procedure for obstructing sigmoid colon mass on 02/06/2017. Preoperative CEA was 5.0  3. 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 It is 0 out of 15 lymph nodes were positive for malignancy. Perineural and lymphovascular invasion was present. Margins were negative.pT3N0. MMR stable.  4. Given that he had high risk stage II colon cancer including he presented with obstruction and has LVI and perineural invasion- adjuvant xeloda chemotherapy was recommended for 6 months.   5. Patient had evidence of hand foot syndrome after cycle 3. Cycle 4 delayed by 1 week. Topical urea cream prescribed   Interval history- Patient reports doing 'well'. Says he has tolerated the Xeloda well. Continues to have mild erythema on his hands and feet. Has been applying the topical urea cream and feels it has improved.   ECOG PS- 0 Pain scale- 0  Review of systems- Review of Systems  Constitutional: Negative for chills, fever, malaise/fatigue and weight loss.  HENT: Negative for congestion, ear discharge and nosebleeds.   Eyes: Negative for blurred vision.    Respiratory: Negative for cough, hemoptysis, sputum production, shortness of breath and wheezing.   Cardiovascular: Negative for chest pain, palpitations, orthopnea and claudication.  Gastrointestinal: Negative for abdominal pain, blood in stool, constipation, diarrhea, heartburn, melena, nausea and vomiting.  Genitourinary: Negative for dysuria, flank pain, frequency, hematuria and urgency.  Musculoskeletal: Negative for back pain, joint pain and myalgias.  Skin: Negative for rash.  Neurological: Negative for dizziness, tingling, focal weakness, seizures, weakness and headaches.  Endo/Heme/Allergies: Does not bruise/bleed easily.  Psychiatric/Behavioral: Negative for depression and suicidal ideas. The patient does not have insomnia.       No Known Allergies   Past Medical History:  Diagnosis Date  . Cancer ([place])    skin cancer-nose w graft,also shoulder- basal cell per pt  . Cataract    r eye  . Chronic kidney disease    kidney stones 20 y ago per pt  . Colon cancer ([place])   . Diabetes mellitus without complication ([place])   . Hyperlipidemia   . Hypertension      Past Surgical History:  Procedure Laterality Date  . COLECTOMY WITH COLOSTOMY CREATION/HARTMANN PROCEDURE N/A 02/06/2017   Procedure: COLECTOMY WITH COLOSTOMY CREATION/HARTMANN PROCEDURE;  Surgeon: [name], MD;  Location: [facility] ORS;  Service: General;  Laterality: N/A;  . CYSTOSCOPY    . FLEXIBLE SIGMOIDOSCOPY N/A 02/06/2017   Procedure: FLEXIBLE SIGMOIDOSCOPY;  Surgeon: Christene Lye, MD;  Location: [facility] ENDOSCOPY;  Service: Endoscopy;  Laterality: N/A;    Social History   Social History  . Marital status: Married    Spouse name: N/A  . Number of children: N/A  . Years of education: N/A   Occupational History  .  Not on file.   Social History Main Topics  . Smoking status: Never Smoker  . Smokeless tobacco: Never Used  . Alcohol use No  . Drug use: No  . Sexual activity: Not Currently    Other Topics Concern  . Not on file   Social History Narrative  . No narrative on file    Family History  Problem Relation Age of Onset  . Diabetes Mother   . AAA (abdominal aortic aneurysm) Mother   . Coronary artery disease Mother   . Diabetes Father   . Coronary artery disease Father   . Dementia Father   . Breast cancer Sister      Current Outpatient Prescriptions:  .  Blood Glucose Monitoring Suppl (GLUCOCOM BLOOD GLUCOSE MONITOR) DEVI, 1 Device by Other route every morning., Disp: , Rfl:  .  Blood Glucose Monitoring Suppl (ONETOUCH VERIO) w/Device KIT, 1 each by XX route as directed. Dx: E11.9, Disp: , Rfl:  .  capecitabine (XELODA) 500 MG tablet, Take 4 tablets (2,000 mg total) by mouth 2 (two) times daily after a meal. Take after food (within 30 minutes of a meal) with water for 14 days and then take 1 week off and then repeat cycle, Disp: 224 tablet, Rfl: 2 .  ferrous sulfate 324 (65 Fe) MG TBEC, Take 324 mg by mouth daily., Disp: , Rfl:  .  glimepiride (AMARYL) 2 MG tablet, Take 2 mg by mouth daily., Disp: , Rfl:  .  Lancets 30G MISC, Use 1 each as directed. Check CBG's fasting once daily. Dx: E11.9, Disp: , Rfl:  .  lisinopril (PRINIVIL,ZESTRIL) 5 MG tablet, Take 5 mg by mouth daily., Disp: , Rfl:  .  loratadine (CLARITIN) 10 MG tablet, Take 10 mg by mouth daily., Disp: , Rfl:  .  losartan (COZAAR) 25 MG tablet, Take 25 mg by mouth daily., Disp: , Rfl:  .  metFORMIN (GLUCOPHAGE) 1000 MG tablet, Take 1,000-1,500 tablets by mouth 2 (two) times daily. Take 1 tablet in AM and 1 1/2 tablets at supper, Disp: , Rfl:  .  pravastatin (PRAVACHOL) 20 MG tablet, Take 20 mg by mouth daily., Disp: , Rfl:  .  urea (CARMOL) 10 % cream, Apply topically as needed. Apply to hands and feet two to three times a day as needed., Disp: 71 g, Rfl: 0 .  Apremilast (OTEZLA) 30 MG TABS, Take 1 tablet by mouth daily., Disp: , Rfl:  .  loperamide (IMODIUM A-D) 2 MG tablet, Take 2 mg by mouth 4  (four) times daily as needed for diarrhea or loose stools., Disp: , Rfl:  .  ondansetron (ZOFRAN) 8 MG tablet, Take 1 tablet (8 mg total) by mouth 2 (two) times daily as needed (Nausea or vomiting). (Patient not taking: Reported on 06/30/2017), Disp: 30 tablet, Rfl: 1 .  prochlorperazine (COMPAZINE) 10 MG tablet, Take 1 tablet (10 mg total) by mouth every 6 (six) hours as needed (Nausea or vomiting). (Patient not taking: Reported on 06/30/2017), Disp: 30 tablet, Rfl: 1  Physical exam:  Vitals:   07/28/17 0957  BP: 138/71  Pulse: 71  Resp: 18  Temp: (!) 97.2 F (36.2 C)  TempSrc: Tympanic  Weight: 186 lb 12.8 oz (84.7 kg)   Physical Exam  Constitutional: He is oriented to person, place, and time and well-developed, well-nourished, and in no distress.  HENT:  Head: Normocephalic and atraumatic.  Eyes: Pupils are equal, round, and reactive to light. EOM are normal.  Neck: Normal range of  motion.  Cardiovascular: Normal rate, regular rhythm and normal heart sounds.   Pulmonary/Chest: Effort normal and breath sounds normal.  Abdominal: Soft. Bowel sounds are normal.  Colostomy in place LLQ  Neurological: He is alert and oriented to person, place, and time.  Skin: Skin is warm and dry. No lesion noted.  Mild erythema on palmar surfaces of hands and feet. No exfoliation.      CMP Latest Ref Rng & Units 07/28/2017  Glucose 65 - 99 mg/dL 184(H)  BUN 6 - 20 mg/dL 17  Creatinine 0.61 - 1.24 mg/dL 1.18  Sodium 135 - 145 mmol/L 136  Potassium 3.5 - 5.1 mmol/L 4.1  Chloride 101 - 111 mmol/L 104  CO2 22 - 32 mmol/L 24  Calcium 8.9 - 10.3 mg/dL 8.9  Total Protein 6.5 - 8.1 g/dL 6.9  Total Bilirubin 0.3 - 1.2 mg/dL 0.7  Alkaline Phos 38 - 126 U/L 63  AST 15 - 41 U/L 23  ALT 17 - 63 U/L 15(L)   CBC Latest Ref Rng & Units 07/28/2017  WBC 3.8 - 10.6 K/uL 6.9  Hemoglobin 13.0 - 18.0 g/dL 13.1  Hematocrit 40.0 - 52.0 % 37.6(L)  Platelets 150 - 440 K/uL 243    Assessment and plan- Patient is  a 76 y.o. male with stage II colon cancer T3N0M0 s/p surgery currently on adjuvant xeloda s/p 6cycles  Stage II colon cancer- labwork ok to proceed with cycle 7 of xeloda today. I will see him back around October 4th-5th for him to prepare for cycle 8. He has a planned vacation the week after so we will see him a few days prior. Will plan for imaging on 11/5. Dr. Burt Knack requested for patient to have colonoscopy by GI after CT. Will plan on having him rtc after colonoscopy and CT for labs and further evaluation. He will also be able to follow back up with Dr. Burt Knack to discuss takedown of ostomy.    Hand foot syndrome- mild. Well controlled with topical urea cream. No dose reduction in xeloda at this point. Refill sent today for urea cream.    Visit Diagnosis 1. 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[place])   2. High risk medication use    Phenix Grein G. Zenia Resides, NP 07/28/17 10:54 AM   Dr. Randa Evens, MD, MPH [place] at [facility] Pager- [phone] 07/28/2017 10:54 AM

## 2017-07-28 NOTE — Progress Notes (Signed)
Here for follow up

## 2017-07-29 LAB — CEA: CEA: 2.6 ng/mL (ref 0.0–4.7)

## 2017-08-12 ENCOUNTER — Other Ambulatory Visit: Payer: Self-pay | Admitting: *Deleted

## 2017-08-12 DIAGNOSIS — C189 Malignant neoplasm of colon, unspecified: Secondary | ICD-10-CM

## 2017-08-13 MED ORDER — CAPECITABINE 500 MG PO TABS: 1000.0000 mg/m2 | ORAL_TABLET | Freq: Two times a day (BID) | ORAL | 0 refills | Status: AC

## 2017-08-14 ENCOUNTER — Inpatient Hospital Stay: Payer: Medicare Other | Attending: Oncology | Admitting: Oncology

## 2017-08-14 ENCOUNTER — Encounter: Payer: Self-pay | Admitting: Oncology

## 2017-08-14 ENCOUNTER — Inpatient Hospital Stay: Payer: Medicare Other

## 2017-08-14 VITALS — BP 128/83 | HR 69 | Temp 98.2°F | Wt 186.3 lb

## 2017-08-14 DIAGNOSIS — C187 Malignant neoplasm of sigmoid colon: Secondary | ICD-10-CM | POA: Diagnosis not present

## 2017-08-14 DIAGNOSIS — Z933 Colostomy status: Secondary | ICD-10-CM

## 2017-08-14 DIAGNOSIS — Z79899 Other long term (current) drug therapy: Secondary | ICD-10-CM | POA: Diagnosis not present

## 2017-08-14 DIAGNOSIS — Z803 Family history of malignant neoplasm of breast: Secondary | ICD-10-CM | POA: Diagnosis not present

## 2017-08-14 DIAGNOSIS — Z87442 Personal history of urinary calculi: Secondary | ICD-10-CM | POA: Insufficient documentation

## 2017-08-14 DIAGNOSIS — Z9049 Acquired absence of other specified parts of digestive tract: Secondary | ICD-10-CM | POA: Diagnosis not present

## 2017-08-14 DIAGNOSIS — E7800 Pure hypercholesterolemia, unspecified: Secondary | ICD-10-CM | POA: Insufficient documentation

## 2017-08-14 DIAGNOSIS — Z7984 Long term (current) use of oral hypoglycemic drugs: Secondary | ICD-10-CM | POA: Diagnosis not present

## 2017-08-14 DIAGNOSIS — C189 Malignant neoplasm of colon, unspecified: Secondary | ICD-10-CM

## 2017-08-14 DIAGNOSIS — I129 Hypertensive chronic kidney disease with stage 1 through stage 4 chronic kidney disease, or unspecified chronic kidney disease: Secondary | ICD-10-CM

## 2017-08-14 DIAGNOSIS — E785 Hyperlipidemia, unspecified: Secondary | ICD-10-CM

## 2017-08-14 DIAGNOSIS — E1122 Type 2 diabetes mellitus with diabetic chronic kidney disease: Secondary | ICD-10-CM | POA: Diagnosis not present

## 2017-08-14 DIAGNOSIS — N189 Chronic kidney disease, unspecified: Secondary | ICD-10-CM | POA: Diagnosis not present

## 2017-08-14 LAB — COMPREHENSIVE METABOLIC PANEL
ALK PHOS: 73 U/L (ref 38–126)
ALT: 14 U/L — ABNORMAL LOW (ref 17–63)
AST: 22 U/L (ref 15–41)
Albumin: 4.5 g/dL (ref 3.5–5.0)
Anion gap: 8 (ref 5–15)
BILIRUBIN TOTAL: 0.4 mg/dL (ref 0.3–1.2)
BUN: 19 mg/dL (ref 6–20)
CALCIUM: 9.3 mg/dL (ref 8.9–10.3)
CO2: 22 mmol/L (ref 22–32)
CREATININE: 1.1 mg/dL (ref 0.61–1.24)
Chloride: 106 mmol/L (ref 101–111)
GFR calc Af Amer: >60 mL/min (ref >60)
Glucose, Bld: 129 mg/dL — ABNORMAL HIGH (ref 65–99)
POTASSIUM: 4 mmol/L (ref 3.5–5.1)
Sodium: 136 mmol/L (ref 135–145)
TOTAL PROTEIN: 7.4 g/dL (ref 6.5–8.1)

## 2017-08-14 LAB — CBC WITH DIFFERENTIAL/PLATELET
BASOS ABS: 0.1 10*3/uL (ref 0–0.1)
Basophils Relative: 1 %
Eosinophils Absolute: 0.3 10*3/uL (ref 0–0.7)
Eosinophils Relative: 3 %
HEMATOCRIT: 37.5 % — AB (ref 40.0–52.0)
HEMOGLOBIN: 13.3 g/dL (ref 13.0–18.0)
LYMPHS PCT: 43 %
Lymphs Abs: 3.8 10*3/uL — ABNORMAL HIGH (ref 1.0–3.6)
MCH: 35.7 pg — ABNORMAL HIGH (ref 26.0–34.0)
MCHC: 35.4 g/dL (ref 32.0–36.0)
MCV: 100.9 fL — AB (ref 80.0–100.0)
MONO ABS: 0.7 10*3/uL (ref 0.2–1.0)
MONOS PCT: 8 %
Neutro Abs: 3.8 10*3/uL (ref 1.4–6.5)
Neutrophils Relative %: 45 %
Platelets: 245 10*3/uL (ref 150–440)
RBC: 3.72 MIL/uL — ABNORMAL LOW (ref 4.40–5.90)
RDW: 18.7 % — AB (ref 11.5–14.5)
WBC: 8.7 10*3/uL (ref 3.8–10.6)

## 2017-08-14 NOTE — Progress Notes (Signed)
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 Patient is a 76 year old male who presented with evidence of bowel obstruction on 02/06/2017. At that time he had progressive abdominal distention and no bowel movement for about one week. CT abdomen showed an apple core lesion in the descending/sigmoid colon.  2. Patient underwent Hartmann's procedure for obstructing sigmoid colon mass on 02/06/2017. Preoperative CEA was 5.0  3. 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 It is 0 out of 15 lymph nodes were positive for malignancy. Perineural and lymphovascular invasion was present. Margins were negative.pT3N0. MMR stable.  4. Given that he had high risk stage II colon cancer including he presented with obstruction and has LVI and perineural invasion- adjuvant xeloda chemotherapy was recommended for 6 months.   5. Patient had evidence of hand foot syndrome after cycle 3. Cycle 4 delayed by 1 week. Topical urea cream prescribed   Interval history- Patient reports doing 'well'. Says he has tolerated the Xeloda well. Erythema of hands and feet has resolved with topical urea cream.    ECOG PS- 0 Pain scale- 0  Review of systems- Review of Systems  Constitutional: Negative for chills, fever, malaise/fatigue and weight loss.  HENT: Negative for congestion, ear discharge and nosebleeds.   Eyes: Negative for blurred vision.  Respiratory: Negative for cough, hemoptysis, sputum  production, shortness of breath and wheezing.   Cardiovascular: Negative for chest pain, palpitations, orthopnea and claudication.  Gastrointestinal: Negative for abdominal pain, blood in stool, constipation, diarrhea, heartburn, melena, nausea and vomiting.  Genitourinary: Negative for dysuria, flank pain, frequency, hematuria and urgency.  Musculoskeletal: Negative for back pain, joint pain and myalgias.  Skin: Negative for rash.  Neurological: Negative for dizziness, tingling, focal weakness, seizures, weakness and headaches.  Endo/Heme/Allergies: Does not bruise/bleed easily.  Psychiatric/Behavioral: Negative for depression and suicidal ideas. The patient does not have insomnia.       No Known Allergies   Past Medical History:  Diagnosis Date  . Cancer ([place])    skin cancer-nose w graft,also shoulder- basal cell per pt  . Cataract    r eye  . Chronic kidney disease    kidney stones 20 y ago per pt  . Colon cancer ([place])   . Diabetes mellitus without complication ([place])   . Hyperlipidemia   . Hypertension      Past Surgical History:  Procedure Laterality Date  . COLECTOMY WITH COLOSTOMY CREATION/HARTMANN PROCEDURE N/A 02/06/2017   Procedure: COLECTOMY WITH COLOSTOMY CREATION/HARTMANN PROCEDURE;  Surgeon: [name], MD;  Location: [facility] ORS;  Service: General;  Laterality: N/A;  . CYSTOSCOPY    . FLEXIBLE SIGMOIDOSCOPY N/A 02/06/2017   Procedure: FLEXIBLE SIGMOIDOSCOPY;  Surgeon: Christene Lye, MD;  Location: [facility] ENDOSCOPY;  Service: Endoscopy;  Laterality: N/A;    Social History   Social History  . Marital status: Married    Spouse name: N/A  . Number of children: N/A  . Years of education: N/A   Occupational History  . Not on file.   Social History Main Topics  .  Smoking status: Never Smoker  . Smokeless tobacco: Never Used  . Alcohol use No  . Drug use: No  . Sexual activity: Not Currently   Other Topics Concern  . Not on file   Social  History Narrative  . No narrative on file    Family History  Problem Relation Age of Onset  . Diabetes Mother   . AAA (abdominal aortic aneurysm) Mother   . Coronary artery disease Mother   . Diabetes Father   . Coronary artery disease Father   . Dementia Father   . Breast cancer Sister      Current Outpatient Prescriptions:  .  Apremilast (OTEZLA) 30 MG TABS, Take 1 tablet by mouth daily., Disp: , Rfl:  .  Blood Glucose Monitoring Suppl (GLUCOCOM BLOOD GLUCOSE MONITOR) DEVI, 1 Device by Other route every morning., Disp: , Rfl:  .  Blood Glucose Monitoring Suppl (ONETOUCH VERIO) w/Device KIT, 1 each by XX route as directed. Dx: E11.9, Disp: , Rfl:  .  capecitabine (XELODA) 500 MG tablet, Take 4 tablets (2,000 mg total) by mouth 2 (two) times daily after a meal. Take after food (within 30 minutes of a meal) with water for 14 days and then take 1 week off and then repeat cycle, Disp: 240 tablet, Rfl: 0 .  ferrous sulfate 324 (65 Fe) MG TBEC, Take 324 mg by mouth daily., Disp: , Rfl:  .  glimepiride (AMARYL) 2 MG tablet, Take 2 mg by mouth daily., Disp: , Rfl:  .  Lancets 30G MISC, Use 1 each as directed. Check CBG's fasting once daily. Dx: E11.9, Disp: , Rfl:  .  lisinopril (PRINIVIL,ZESTRIL) 5 MG tablet, Take 5 mg by mouth daily., Disp: , Rfl:  .  loperamide (IMODIUM A-D) 2 MG tablet, Take 2 mg by mouth 4 (four) times daily as needed for diarrhea or loose stools., Disp: , Rfl:  .  loratadine (CLARITIN) 10 MG tablet, Take 10 mg by mouth daily., Disp: , Rfl:  .  losartan (COZAAR) 25 MG tablet, Take 25 mg by mouth daily., Disp: , Rfl:  .  metFORMIN (GLUCOPHAGE) 1000 MG tablet, Take 1,000-1,500 tablets by mouth 2 (two) times daily. Take 1 tablet in AM and 1 1/2 tablets at supper, Disp: , Rfl:  .  ondansetron (ZOFRAN) 8 MG tablet, Take 1 tablet (8 mg total) by mouth 2 (two) times daily as needed (Nausea or vomiting). (Patient not taking: Reported on 06/30/2017), Disp: 30 tablet, Rfl: 1 .   pravastatin (PRAVACHOL) 20 MG tablet, Take 20 mg by mouth daily., Disp: , Rfl:  .  prochlorperazine (COMPAZINE) 10 MG tablet, Take 1 tablet (10 mg total) by mouth every 6 (six) hours as needed (Nausea or vomiting). (Patient not taking: Reported on 06/30/2017), Disp: 30 tablet, Rfl: 1 .  urea (CARMOL) 10 % cream, Apply topically as needed. Apply to hands and feet two to three times a day as needed., Disp: 71 g, Rfl: 0  Physical exam:  There were no vitals filed for this visit. Physical Exam  Constitutional: He is oriented to person, place, and time and well-developed, well-nourished, and in no distress.  HENT:  Head: Normocephalic and atraumatic.  Eyes: Pupils are equal, round, and reactive to light. EOM are normal.  Neck: Normal range of motion.  Cardiovascular: Normal rate, regular rhythm and normal heart sounds.   Pulmonary/Chest: Effort normal and breath sounds normal.  Abdominal: Soft. Bowel sounds are normal.  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  Skin: Skin is warm and dry. No lesion noted.  Mild erythema on palmar surfaces of hands and feet. No exfoliation.      CMP Latest Ref Rng & Units 07/28/2017  Glucose 65 - 99 mg/dL 184(H)  BUN 6 - 20 mg/dL 17  Creatinine 0.61 - 1.24 mg/dL 1.18  Sodium 135 - 145 mmol/L 136  Potassium 3.5 - 5.1 mmol/L 4.1  Chloride 101 - 111 mmol/L 104  CO2 22 - 32 mmol/L 24  Calcium 8.9 - 10.3 mg/dL 8.9  Total Protein 6.5 - 8.1 g/dL 6.9  Total Bilirubin 0.3 - 1.2 mg/dL 0.7  Alkaline Phos 38 - 126 U/L 63  AST 15 - 41 U/L 23  ALT 17 - 63 U/L 15(L)   CBC Latest Ref Rng & Units 07/28/2017  WBC 3.8 - 10.6 K/uL 6.9  Hemoglobin 13.0 - 18.0 g/dL 13.1  Hematocrit 40.0 - 52.0 % 37.6(L)  Platelets 150 - 440 K/uL 243    Assessment and plan- Patient is a 76 y.o. male with stage II colon cancer T3N0M0 s/p surgery currently on adjuvant xeloda s/p 7cycles  Stage II colon cancer- labwork ok to proceed with  cycle 8 of xeloda on 08/21/17. He sees GI 08/26/17 for consideration of colonoscopy then re-imaging is scheduled for 09/14/17. Will plan to see him back in clinic after that. He is then to follow back up with Dr. Burt Knack to discuss takedown of ostomy. Will check labs & CEA every 6 months along with re-imaging.  Hand foot syndrome- resolved. Patient continues topical urea cream Well controlled with topical urea cream. No dose reduction required.     RTC few days after CT scan which is scheduled for 09/14/17.   Visit Diagnosis No diagnosis found.  Malyia Moro G. Zenia Resides, DNP AGNP-C 08/14/17 11:25 AM   Dr. Randa Evens, MD, MPH [facility] Pager- [phone] 08/14/2017 11:17 AM

## 2017-08-24 ENCOUNTER — Telehealth: Payer: Self-pay | Admitting: *Deleted

## 2017-08-24 ENCOUNTER — Other Ambulatory Visit: Payer: Self-pay | Admitting: *Deleted

## 2017-08-24 DIAGNOSIS — C189 Malignant neoplasm of colon, unspecified: Secondary | ICD-10-CM

## 2017-08-24 NOTE — Telephone Encounter (Signed)
I called Miguel Flores to ask if he was already on his xeloda 8 th cycle. He states hs is.  I told him that we got a renewal for another cycle and I denied it because it does not need to be filled. He would like to know if he can have flu shot.  I told him that I would check with [facility] and let him know.   I spoke to NP [facility] working with [facility] and Miguel Flores is fine to have flu shot.  I called Miguel Flores back and left message on his voicemail that he can have shot. He was going to [facility] to get one this week if ok

## 2017-08-25 ENCOUNTER — Other Ambulatory Visit: Payer: Self-pay | Admitting: *Deleted

## 2017-08-25 NOTE — Telephone Encounter (Signed)
Patient no longer needs this prescription. He will be completing therapy after this current cycle

## 2017-08-26 ENCOUNTER — Encounter (INDEPENDENT_AMBULATORY_CARE_PROVIDER_SITE_OTHER): Payer: Self-pay

## 2017-08-26 ENCOUNTER — Ambulatory Visit (INDEPENDENT_AMBULATORY_CARE_PROVIDER_SITE_OTHER): Payer: Medicare Other | Admitting: Gastroenterology

## 2017-08-26 ENCOUNTER — Encounter: Payer: Self-pay | Admitting: Gastroenterology

## 2017-08-26 VITALS — BP 145/71 | HR 71 | Temp 98.1°F | Ht 63.0 in | Wt 187.2 lb

## 2017-08-26 DIAGNOSIS — C189 Malignant neoplasm of colon, unspecified: Secondary | ICD-10-CM

## 2017-08-26 NOTE — Progress Notes (Signed)
Jonathon Bellows MD, MRCP(U.K) [address]  [place]  [place], [place] [address]  Main: [phone]  Fax: [phone]   Gastroenterology Consultation  Referring Provider:  Dr [name]  Primary Care Physician:  [name], MD Primary Gastroenterologist:  Dr. Jonathon Bellows  Reason for Consultation:     Colonoscopy         HPI:   Miguel Flores is a 76 y.o. y/o male .He has been referred for a colonoscopy by Dr [name] in Oncology .   Summary of history : 76 year old male who presented with evidence of bowel obstruction on 02/06/2017.CT abdomen showed an apple core lesion in the descending/sigmoid colon with proximal dilation .Patient underwent colon resection with colostomy for obstructing sigmoid colon mass on 02/06/2017.negative lymph nodes for invasion. .Pathology from 02/06/2017 showed: Moderately differentiated grade 2 invasive adenocarcinoma of the sigmoid colon   He has been referred here for a colonoscopy prior to reversal of colostomy .  Never had a colonoscopy , grandfather had colon , he has 3 sons, aged - older son . He is doing well otherwise.    Past Medical History:  Diagnosis Date  . Cancer ([place])    skin cancer-nose w graft,also shoulder- basal cell per pt  . Cataract    r eye  . Chronic kidney disease    kidney stones 20 y ago per pt  . Colon cancer ([place])   . Diabetes mellitus without complication ([place])   . Hyperlipidemia   . Hypertension     Past Surgical History:  Procedure Laterality Date  . COLECTOMY WITH COLOSTOMY CREATION/HARTMANN PROCEDURE N/A 02/06/2017   Procedure: COLECTOMY WITH COLOSTOMY CREATION/HARTMANN PROCEDURE;  Surgeon: [name], MD;  Location: [facility] ORS;  Service: General;  Laterality: N/A;  . CYSTOSCOPY    . FLEXIBLE SIGMOIDOSCOPY N/A 02/06/2017   Procedure: FLEXIBLE SIGMOIDOSCOPY;  Surgeon: Christene Lye, MD;  Location: [facility] ENDOSCOPY;  Service: Endoscopy;  Laterality: N/A;    Prior to Admission medications     Medication Sig Start Date End Date Taking? Authorizing Provider  Apremilast (OTEZLA) 30 MG TABS Take 1 tablet by mouth daily.    [provider]  Blood Glucose Monitoring Suppl (GLUCOCOM BLOOD GLUCOSE MONITOR) DEVI 1 Device by Other route every morning.    [provider]  Blood Glucose Monitoring Suppl (ONETOUCH VERIO) w/Device KIT 1 each by XX route as directed. Dx: E11.9 04/17/17   [provider]  capecitabine (XELODA) 500 MG tablet Take 4 tablets (2,000 mg total) by mouth 2 (two) times daily after a meal. Take after food (within 30 minutes of a meal) with water for 14 days and then take 1 week off and then repeat cycle 08/13/17 09/12/17  Sindy Guadeloupe, MD  ferrous sulfate 324 (65 Fe) MG TBEC Take 324 mg by mouth daily. 02/03/17   [provider]  glimepiride (AMARYL) 2 MG tablet Take 2 mg by mouth daily. 08/14/15   [provider]  Lancets 30G MISC Use 1 each as directed. Check CBG's fasting once daily. Dx: E11.9 04/17/17   [provider]  lisinopril (PRINIVIL,ZESTRIL) 5 MG tablet Take 5 mg by mouth daily.    [provider]  loperamide (IMODIUM A-D) 2 MG tablet Take 2 mg by mouth 4 (four) times daily as needed for diarrhea or loose stools.    [provider]  loratadine (CLARITIN) 10 MG tablet Take 10 mg by mouth daily.    [provider]  losartan (COZAAR) 25  MG tablet Take 25 mg by mouth daily. 03/27/16   [provider]  metFORMIN (GLUCOPHAGE) 1000 MG tablet Take 1,000-1,500 tablets by mouth 2 (two) times daily. Take 1 tablet in AM and 1 1/2 tablets at supper 08/07/16   [provider]  ondansetron (ZOFRAN) 8 MG tablet Take 1 tablet (8 mg total) by mouth 2 (two) times daily as needed (Nausea or vomiting). 03/09/17   Sindy Guadeloupe, MD  pravastatin (PRAVACHOL) 20 MG tablet Take 20 mg by mouth daily. 12/27/15   [provider]  prochlorperazine (COMPAZINE) 10 MG tablet Take 1 tablet (10 mg total)  by mouth every 6 (six) hours as needed (Nausea or vomiting). 03/09/17   Sindy Guadeloupe, MD  urea (CARMOL) 10 % cream Apply topically as needed. Apply to hands and feet two to three times a day as needed. 07/28/17   Verlon Au, NP    Family History  Problem Relation Age of Onset  . Diabetes Mother   . AAA (abdominal aortic aneurysm) Mother   . Coronary artery disease Mother   . Diabetes Father   . Coronary artery disease Father   . Dementia Father   . Breast cancer Sister      Social History  Substance Use Topics  . Smoking status: Never Smoker  . Smokeless tobacco: Never Used  . Alcohol use No    Allergies as of 08/26/2017  . (No Known Allergies)    Review of Systems:    All systems reviewed and negative except where noted in HPI.   Physical Exam:  BP (!) 145/71   Pulse 71   Temp 98.1 F (36.7 C) (Oral)   Ht _0  (1.6 m)   Wt 187 lb 3.2 oz (84.9 kg)   BMI 33.16 kg/m  No LMP for male patient. Psych:  Alert and cooperative. Normal mood and affect. General:   Alert,  Well-developed, well-nourished, pleasant and cooperative in NAD Head:  Normocephalic and atraumatic. Eyes:  Sclera clear, no icterus.   Conjunctiva pink. Ears:  Normal auditory acuity. Nose:  No deformity, discharge, or lesions. Mouth:  No deformity or lesions,oropharynx pink & moist. Neck:  Supple; no masses or thyromegaly. Lungs:  Respirations even and unlabored.  Clear throughout to auscultation.   No wheezes, crackles, or rhonchi. No acute distress. Heart:  Regular rate and rhythm; no murmurs, clicks, rubs, or gallops. Abdomen:  LLQ colostomy bag, central verticle abdominal scar, no tenderness guarding or rigidity   Neurologic:  Alert and oriented x3;  grossly normal neurologically. Skin:  Intact without significant lesions or rashes. No jaundice. Lymph Nodes:  No significant cervical adenopathy. Psych:  Alert and cooperative. Normal mood and affect.  Imaging Studies: No results  found.  Assessment and Plan:   Miguel Flores is a 76 y.o. y/o male has been referred for a colonoscopy prior to reversal of his colostomy which he had for acute bowel obstruction due to a sigmoid cancer.    1. Advised him to inform his sons who are over 40 years to undergo a colonoscopy  2. Diagnostic colonoscopy .  3. Suprep sample provided  I have discussed alternative options, risks & benefits,  which include, but are not limited to, bleeding, infection, perforation,respiratory complication & drug reaction.  The patient agrees with this plan & written consent will be obtained.    Follow up PRN  Dr Jonathon Bellows MD,MRCP(U.K)

## 2017-09-04 ENCOUNTER — Ambulatory Visit: Payer: Medicare Other | Admitting: Anesthesiology

## 2017-09-04 ENCOUNTER — Encounter
Admission: RE | Disposition: A | Discharge status: Home / Self Care | Payer: Self-pay | Source: Ambulatory Visit | Attending: Gastroenterology

## 2017-09-04 ENCOUNTER — Ambulatory Visit
Admission: RE | Admit: 2017-09-04 | Discharge: 2017-09-04 | Disposition: A | Discharge status: Home / Self Care | Payer: Medicare Other | Source: Ambulatory Visit | Attending: Gastroenterology | Admitting: Gastroenterology

## 2017-09-04 ENCOUNTER — Encounter: Payer: Self-pay | Admitting: *Deleted

## 2017-09-04 DIAGNOSIS — D124 Benign neoplasm of descending colon: Secondary | ICD-10-CM | POA: Diagnosis not present

## 2017-09-04 DIAGNOSIS — Z79899 Other long term (current) drug therapy: Secondary | ICD-10-CM | POA: Insufficient documentation

## 2017-09-04 DIAGNOSIS — D125 Benign neoplasm of sigmoid colon: Secondary | ICD-10-CM | POA: Diagnosis not present

## 2017-09-04 DIAGNOSIS — Z933 Colostomy status: Secondary | ICD-10-CM | POA: Insufficient documentation

## 2017-09-04 DIAGNOSIS — D123 Benign neoplasm of transverse colon: Secondary | ICD-10-CM | POA: Insufficient documentation

## 2017-09-04 DIAGNOSIS — E1122 Type 2 diabetes mellitus with diabetic chronic kidney disease: Secondary | ICD-10-CM | POA: Diagnosis not present

## 2017-09-04 DIAGNOSIS — Z1211 Encounter for screening for malignant neoplasm of colon: Secondary | ICD-10-CM | POA: Insufficient documentation

## 2017-09-04 DIAGNOSIS — D649 Anemia, unspecified: Secondary | ICD-10-CM | POA: Diagnosis not present

## 2017-09-04 DIAGNOSIS — Z7984 Long term (current) use of oral hypoglycemic drugs: Secondary | ICD-10-CM | POA: Insufficient documentation

## 2017-09-04 DIAGNOSIS — Z85038 Personal history of other malignant neoplasm of large intestine: Secondary | ICD-10-CM | POA: Insufficient documentation

## 2017-09-04 DIAGNOSIS — C189 Malignant neoplasm of colon, unspecified: Secondary | ICD-10-CM

## 2017-09-04 DIAGNOSIS — E785 Hyperlipidemia, unspecified: Secondary | ICD-10-CM | POA: Diagnosis not present

## 2017-09-04 DIAGNOSIS — K635 Polyp of colon: Secondary | ICD-10-CM | POA: Insufficient documentation

## 2017-09-04 DIAGNOSIS — I129 Hypertensive chronic kidney disease with stage 1 through stage 4 chronic kidney disease, or unspecified chronic kidney disease: Secondary | ICD-10-CM | POA: Diagnosis not present

## 2017-09-04 DIAGNOSIS — Z85828 Personal history of other malignant neoplasm of skin: Secondary | ICD-10-CM | POA: Insufficient documentation

## 2017-09-04 DIAGNOSIS — N189 Chronic kidney disease, unspecified: Secondary | ICD-10-CM | POA: Diagnosis not present

## 2017-09-04 HISTORY — PX: COLONOSCOPY WITH PROPOFOL: SHX5780

## 2017-09-04 LAB — GLUCOSE, CAPILLARY: GLUCOSE-CAPILLARY: 204 mg/dL — AB (ref 65–99)

## 2017-09-04 SURGERY — COLONOSCOPY WITH PROPOFOL
Anesthesia: General

## 2017-09-04 MED ORDER — PROPOFOL 500 MG/50ML IV EMUL
INTRAVENOUS | Status: DC | PRN
  Administered 2017-09-04: 140 ug/kg/min via INTRAVENOUS

## 2017-09-04 MED ORDER — PHENYLEPHRINE HCL 10 MG/ML IJ SOLN
INTRAMUSCULAR | Status: DC | PRN
  Administered 2017-09-04 (×2): 100 ug via INTRAVENOUS

## 2017-09-04 MED ORDER — PROPOFOL 10 MG/ML IV BOLUS
INTRAVENOUS | Status: DC | PRN
  Administered 2017-09-04: 100 mg via INTRAVENOUS

## 2017-09-04 MED ORDER — SODIUM CHLORIDE 0.9 % IV SOLN
INTRAVENOUS | Status: DC
  Administered 2017-09-04: 09:00:00 via INTRAVENOUS

## 2017-09-04 MED ORDER — FENTANYL CITRATE (PF) 100 MCG/2ML IJ SOLN
INTRAMUSCULAR | Status: AC
  Filled 2017-09-04: qty 2

## 2017-09-04 MED ORDER — EPHEDRINE SULFATE 50 MG/ML IJ SOLN
INTRAMUSCULAR | Status: DC | PRN
  Administered 2017-09-04 (×2): 10 mg via INTRAVENOUS
  Administered 2017-09-04: 5 mg via INTRAVENOUS

## 2017-09-04 MED ORDER — LIDOCAINE 2% (20 MG/ML) 5 ML SYRINGE
INTRAMUSCULAR | Status: DC | PRN
  Administered 2017-09-04: 40 mg via INTRAVENOUS

## 2017-09-04 MED ORDER — FENTANYL CITRATE (PF) 100 MCG/2ML IJ SOLN
INTRAMUSCULAR | Status: DC | PRN
  Administered 2017-09-04 (×2): 50 ug via INTRAVENOUS

## 2017-09-04 NOTE — Transfer of Care (Signed)
Immediate Anesthesia Transfer of Care Note  Patient: Miguel Flores  Procedure(s) Performed: COLONOSCOPY WITH PROPOFOL (N/A )  Patient Location: PACU and Endoscopy Unit  Anesthesia Type:General  Level of Consciousness: awake and patient cooperative  Airway & Oxygen Therapy: Patient Spontanous Breathing  Post-op Assessment: Report given to RN and Post -op Vital signs reviewed and stable  Post vital signs: Reviewed and stable  Last Vitals:  Vitals:   09/04/17 0829  BP: 137/66  Pulse: 71  Resp: 16  Temp: 36.9 C  SpO2: 97%    Last Pain:  Vitals:   09/04/17 0829  TempSrc: Tympanic  PainSc: 0-No pain         Complications: No apparent anesthesia complications

## 2017-09-04 NOTE — Op Note (Signed)
[facility] Gastroenterology Patient Name: Miguel Flores Procedure Date: 09/04/2017 9:18 AM MRN: [id number] Account #: [id number] Date of Birth: Jan 16, 1941 Admit Type: Outpatient Age: 76 Room: [facility] ENDO ROOM 4 Gender: Male Note Status: Finalized Procedure:            Colonoscopy Indications:          High risk colon cancer surveillance: Personal history                        of colon cancer Providers:            Jonathon Bellows MD, MD Referring MD:         [name] (Referring MD) Medicines:            Monitored Anesthesia Care Complications:        No immediate complications. Procedure:            Pre-Anesthesia Assessment:                       - Prior to the procedure, a History and Physical was                        performed, and patient medications, allergies and                        sensitivities were reviewed. The patient's tolerance of                        previous anesthesia was reviewed.                       - The risks and benefits of the procedure and the                        sedation options and risks were discussed with the                        patient. All questions were answered and informed                        consent was obtained.                       - ASA Grade Assessment: III - A patient with severe                        systemic disease.                       After obtaining informed consent, the colonoscope was                        passed under direct vision. Throughout the procedure,                        the patient's blood pressure, pulse, and oxygen                        saturations were monitored continuously. The                        Colonoscope was  introduced through the sigmoid                        colostomy and advanced to the the cecum, identified by                        appendiceal orifice and ileocecal valve. The                        colonoscopy was performed with ease. The patient             tolerated the procedure well. The quality of the bowel                        preparation was good. Findings:      The perianal and digital rectal examinations were normal.      Two sessile polyps were found in the descending colon. The polyps were 4       to 6 mm in size. These polyps were removed with a cold snare. Resection       and retrieval were complete.      Two sessile polyps were found in the sigmoid colon and transverse colon.       The polyps were 3 to 4 mm in size. These polyps were removed with a cold       biopsy forceps. Resection and retrieval were complete.      A 6 mm polyp was found in the transverse colon. The polyp was sessile.       The polyp was removed with a cold snare. Resection and retrieval were       complete.      The colonoscope was initially passed through the colostomy and       subsequently it was re inserted through the anus ,advanced till the       suture line of surgery and withdrawn, on withdrawal the mucosa was       examined      The exam was otherwise without abnormality on direct and retroflexion       views. Impression:           - Two 4 to 6 mm polyps in the descending colon, removed                        with a cold snare. Resected and retrieved.                       - Two 3 to 4 mm polyps in the sigmoid colon and in the                        transverse colon, removed with a cold biopsy forceps.                        Resected and retrieved.                       - One 6 mm polyp in the transverse colon, removed with                        a cold snare. Resected and retrieved.                       -  The examination was otherwise normal on direct and                        retroflexion views. Recommendation:       - Discharge patient to home (with escort).                       - Resume previous diet.                       - Continue present medications.                       - Await pathology results.                       -  Repeat colonoscopy in 1 year for surveillance.                       - Can proceed with reversal of colostomy Procedure Code(s):    --- Professional ---                       [phone], Colonoscopy through stoma; with removal of                        tumor(s), polyp(s), or other lesion(s) by snare                        technique                       38101, 66, Colonoscopy through stoma; with biopsy,                        single or multiple Diagnosis Code(s):    --- Professional ---                       B51.025, Personal history of other malignant neoplasm                        of large intestine                       D12.4, Benign neoplasm of descending colon                       D12.5, Benign neoplasm of sigmoid colon                       D12.3, Benign neoplasm of transverse colon (hepatic                        flexure or splenic flexure) CPT copyright 2016 American Medical Association. All rights reserved. The codes documented in this report are preliminary and upon coder review may  be revised to meet current compliance requirements. Jonathon Bellows, MD Jonathon Bellows MD, MD 09/04/2017 9:51:47 AM This report has been signed electronically. Number of Addenda: 0 Note Initiated On: 09/04/2017 9:18 AM Scope Withdrawal Time: 0 hours 21 minutes 51 seconds  Total Procedure Duration: 0 hours 23 minutes 10 seconds       [facility]

## 2017-09-04 NOTE — Anesthesia Preprocedure Evaluation (Signed)
Anesthesia Evaluation  Patient identified by MRN, date of birth, ID band Patient awake    Reviewed: Allergy & Precautions, NPO status , Patient's Chart, lab work & pertinent test results, reviewed documented beta blocker date and time   Airway Mallampati: III  TM Distance: >3 FB     Dental  (+) Chipped   Pulmonary neg pulmonary ROS,           Cardiovascular hypertension, Pt. on medications      Neuro/Psych negative neurological ROS  negative psych ROS   GI/Hepatic Neg liver ROS, Colon CA   Endo/Other  diabetes, Type 2  Renal/GU Renal InsufficiencyRenal disease     Musculoskeletal   Abdominal   Peds negative pediatric ROS (+)  Hematology  (+) anemia ,   Anesthesia Other Findings Past Medical History: No date: Cancer ([place])     Comment:  skin cancer-nose w graft,also shoulder- basal cell per               pt No date: Cataract     Comment:  r eye No date: Chronic kidney disease     Comment:  kidney stones 20 y ago per pt No date: Colon cancer ([place]) No date: Diabetes mellitus without complication ([facility]) No date: Hyperlipidemia No date: Hypertension  Reproductive/Obstetrics                             Anesthesia Physical  Anesthesia Plan  ASA: III  Anesthesia Plan: General   Post-op Pain Management:    Induction: Intravenous  PONV Risk Score and Plan:   Airway Management Planned: Nasal Cannula  Additional Equipment:   Intra-op Plan:   Post-operative Plan:   Informed Consent: I have reviewed the patients History and Physical, chart, labs and discussed the procedure including the risks, benefits and alternatives for the proposed anesthesia with the patient or authorized representative who has indicated his/her understanding and acceptance.     Plan Discussed with: CRNA  Anesthesia Plan Comments:         Anesthesia Quick Evaluation

## 2017-09-04 NOTE — Anesthesia Post-op Follow-up Note (Signed)
Anesthesia QCDR form completed.        

## 2017-09-04 NOTE — Anesthesia Postprocedure Evaluation (Signed)
Anesthesia Post Note  Patient: Miguel Flores  Procedure(s) Performed: COLONOSCOPY WITH PROPOFOL (N/A )  Patient location during evaluation: PACU Anesthesia Type: General Level of consciousness: awake and alert and oriented Pain management: pain level controlled Vital Signs Assessment: post-procedure vital signs reviewed and stable Respiratory status: spontaneous breathing Cardiovascular status: blood pressure returned to baseline Anesthetic complications: no     Last Vitals:  Vitals:   09/04/17 1018 09/04/17 1028  BP: (!) 128/59   Pulse: 69 69  Resp: (!) 23 16  Temp:    SpO2: 96% 96%    Last Pain:  Vitals:   09/04/17 0959  TempSrc: Tympanic  PainSc:                  [name]

## 2017-09-04 NOTE — H&P (Signed)
Jonathon Bellows  MD [address], [place], [place], [place], [address] [address], [place], [place], [place], [address] Phone: [phone]     [phone]  Primary Care Physician:  [name], MD Primary Gastroenterologist:  Nilsa Nutting   Pre-Procedure History & Physical: HPI:  PANCHO RUSHING is a 76 y.o. male is here for an colonoscopy.   Past Medical History:  Diagnosis Date  . Cancer ([place])    skin cancer-nose w graft,also shoulder- basal cell per pt  . Cataract    r eye  . Chronic kidney disease    kidney stones 20 y ago per pt  . Colon cancer ([place])   . Diabetes mellitus without complication ([place])   . Hyperlipidemia   . Hypertension     Past Surgical History:  Procedure Laterality Date  . COLECTOMY WITH COLOSTOMY CREATION/HARTMANN PROCEDURE N/A 02/06/2017   Procedure: COLECTOMY WITH COLOSTOMY CREATION/HARTMANN PROCEDURE;  Surgeon: [name], MD;  Location: [facility] ORS;  Service: General;  Laterality: N/A;  . CYSTOSCOPY    . FLEXIBLE SIGMOIDOSCOPY N/A 02/06/2017   Procedure: FLEXIBLE SIGMOIDOSCOPY;  Surgeon: Christene Lye, MD;  Location: [facility] ENDOSCOPY;  Service: Endoscopy;  Laterality: N/A;    Prior to Admission medications   Medication Sig Start Date End Date Taking? Authorizing Provider  Apremilast (OTEZLA) 30 MG TABS Take 1 tablet by mouth daily.   Yes [provider]  capecitabine (XELODA) 500 MG tablet Take 4 tablets (2,000 mg total) by mouth 2 (two) times daily after a meal. Take after food (within 30 minutes of a meal) with water for 14 days and then take 1 week off and then repeat cycle 08/13/17 09/12/17 Yes Sindy Guadeloupe, MD  ferrous sulfate 324 (65 Fe) MG TBEC Take 324 mg by mouth daily. 02/03/17  Yes [provider]  glimepiride (AMARYL) 2 MG tablet Take 2 mg by mouth daily. 08/14/15  Yes [provider]  lisinopril (PRINIVIL,ZESTRIL) 5 MG tablet Take 5 mg by mouth daily.   Yes [provider]    loratadine (CLARITIN) 10 MG tablet Take 10 mg by mouth daily.   Yes [provider]  losartan (COZAAR) 25 MG tablet Take 25 mg by mouth daily. 03/27/16  Yes [provider]  metFORMIN (GLUCOPHAGE) 1000 MG tablet Take 1,000-1,500 tablets by mouth 2 (two) times daily. Take 1 tablet in AM and 1 1/2 tablets at supper 08/07/16  Yes [provider]  pravastatin (PRAVACHOL) 20 MG tablet Take 20 mg by mouth daily. 12/27/15  Yes [provider]  urea (CARMOL) 10 % cream Apply topically as needed. Apply to hands and feet two to three times a day as needed. 07/28/17  Yes Verlon Au, NP  Blood Glucose Monitoring Suppl (GLUCOCOM BLOOD GLUCOSE MONITOR) DEVI 1 Device by Other route every morning.    [provider]  Blood Glucose Monitoring Suppl (ONETOUCH VERIO) w/Device KIT 1 each by XX route as directed. Dx: E11.9 04/17/17   [provider]  Lancets 30G MISC Use 1 each as directed. Check CBG's fasting once daily. Dx: E11.9 04/17/17   [provider]  loperamide (IMODIUM A-D) 2 MG tablet Take 2 mg by mouth 4 (four) times daily as needed for diarrhea or loose stools.    [provider]  ondansetron (ZOFRAN) 8 MG tablet Take 1 tablet (8 mg total) by mouth 2 (two) times daily as needed (Nausea or vomiting). 03/09/17   Sindy Guadeloupe, MD  prochlorperazine (COMPAZINE) 10  MG tablet Take 1 tablet (10 mg total) by mouth every 6 (six) hours as needed (Nausea or vomiting). Patient not taking: Reported on 08/26/2017 03/09/17   Sindy Guadeloupe, MD    Allergies as of 08/26/2017  . (No Known Allergies)    Family History  Problem Relation Age of Onset  . Diabetes Mother   . AAA (abdominal aortic aneurysm) Mother   . Coronary artery disease Mother   . Diabetes Father   . Coronary artery disease Father   . Dementia Father   . Breast cancer Sister     Social History   Social History  . Marital status: Married    Spouse name: N/A  . Number of  children: N/A  . Years of education: N/A   Occupational History  . Not on file.   Social History Main Topics  . Smoking status: Never Smoker  . Smokeless tobacco: Never Used  . Alcohol use No  . Drug use: No  . Sexual activity: Not Currently   Other Topics Concern  . Not on file   Social History Narrative  . No narrative on file    Review of Systems: See HPI, otherwise negative ROS  Physical Exam: BP 137/66   Pulse 71   Temp 98.4 F (36.9 C) (Tympanic)   Resp 16   Ht _0  (1.676 m)   Wt 187 lb (84.8 kg)   SpO2 97%   BMI 30.18 kg/m  General:   Alert,  pleasant and cooperative in NAD Head:  Normocephalic and atraumatic. Neck:  Supple; no masses or thyromegaly. Lungs:  Clear throughout to auscultation.    Heart:  Regular rate and rhythm. Abdomen:  Soft, nontender and nondistended. Normal bowel sounds, without guarding, and without rebound.   Neurologic:  Alert and  oriented x4;  grossly normal neurologically.  Impression/Plan: ZEBULON GANTT is here for an colonoscopy to be performed for evalution of colorectal cancer, s/p colostomy and awaiting reversal   Risks, benefits, limitations, and alternatives regarding  colonoscopy have been reviewed with the patient.  Questions have been answered.  All parties agreeable.   Jonathon Bellows, MD  09/04/2017, 9:08 AM

## 2017-09-07 ENCOUNTER — Encounter: Payer: Self-pay | Admitting: Gastroenterology

## 2017-09-07 LAB — SURGICAL PATHOLOGY

## 2017-09-08 ENCOUNTER — Encounter: Payer: Self-pay | Admitting: Gastroenterology

## 2017-09-14 ENCOUNTER — Ambulatory Visit
Admission: RE | Admit: 2017-09-14 | Discharge: 2017-09-14 | Disposition: A | Discharge status: Home / Self Care | Payer: Medicare Other | Source: Ambulatory Visit | Attending: Nurse Practitioner | Admitting: Nurse Practitioner

## 2017-09-14 DIAGNOSIS — I700 Atherosclerosis of aorta: Secondary | ICD-10-CM | POA: Diagnosis not present

## 2017-09-14 DIAGNOSIS — C189 Malignant neoplasm of colon, unspecified: Secondary | ICD-10-CM | POA: Insufficient documentation

## 2017-09-14 DIAGNOSIS — R911 Solitary pulmonary nodule: Secondary | ICD-10-CM | POA: Insufficient documentation

## 2017-09-14 DIAGNOSIS — K8020 Calculus of gallbladder without cholecystitis without obstruction: Secondary | ICD-10-CM | POA: Diagnosis not present

## 2017-09-14 MED ORDER — IOPAMIDOL (ISOVUE-300) INJECTION 61%
100.0000 mL | Freq: Once | INTRAVENOUS | Status: AC | PRN
  Administered 2017-09-14: 100 mL via INTRAVENOUS

## 2017-09-15 ENCOUNTER — Inpatient Hospital Stay: Payer: Medicare Other | Attending: Oncology | Admitting: Oncology

## 2017-09-15 ENCOUNTER — Telehealth: Payer: Self-pay | Admitting: *Deleted

## 2017-09-15 ENCOUNTER — Encounter: Payer: Self-pay | Admitting: Oncology

## 2017-09-15 VITALS — BP 133/79 | HR 71 | Temp 97.0°F | Resp 16 | Wt 189.0 lb

## 2017-09-15 DIAGNOSIS — E785 Hyperlipidemia, unspecified: Secondary | ICD-10-CM | POA: Insufficient documentation

## 2017-09-15 DIAGNOSIS — R911 Solitary pulmonary nodule: Secondary | ICD-10-CM | POA: Diagnosis not present

## 2017-09-15 DIAGNOSIS — Z9221 Personal history of antineoplastic chemotherapy: Secondary | ICD-10-CM | POA: Insufficient documentation

## 2017-09-15 DIAGNOSIS — E1122 Type 2 diabetes mellitus with diabetic chronic kidney disease: Secondary | ICD-10-CM | POA: Diagnosis not present

## 2017-09-15 DIAGNOSIS — Z933 Colostomy status: Secondary | ICD-10-CM | POA: Insufficient documentation

## 2017-09-15 DIAGNOSIS — Z79899 Other long term (current) drug therapy: Secondary | ICD-10-CM

## 2017-09-15 DIAGNOSIS — Z87442 Personal history of urinary calculi: Secondary | ICD-10-CM

## 2017-09-15 DIAGNOSIS — Z9049 Acquired absence of other specified parts of digestive tract: Secondary | ICD-10-CM | POA: Insufficient documentation

## 2017-09-15 DIAGNOSIS — C187 Malignant neoplasm of sigmoid colon: Secondary | ICD-10-CM | POA: Diagnosis present

## 2017-09-15 DIAGNOSIS — C189 Malignant neoplasm of colon, unspecified: Secondary | ICD-10-CM

## 2017-09-15 DIAGNOSIS — Z85828 Personal history of other malignant neoplasm of skin: Secondary | ICD-10-CM | POA: Diagnosis not present

## 2017-09-15 DIAGNOSIS — Z7984 Long term (current) use of oral hypoglycemic drugs: Secondary | ICD-10-CM | POA: Diagnosis not present

## 2017-09-15 DIAGNOSIS — N189 Chronic kidney disease, unspecified: Secondary | ICD-10-CM | POA: Insufficient documentation

## 2017-09-15 DIAGNOSIS — I129 Hypertensive chronic kidney disease with stage 1 through stage 4 chronic kidney disease, or unspecified chronic kidney disease: Secondary | ICD-10-CM | POA: Insufficient documentation

## 2017-09-15 NOTE — Progress Notes (Signed)
Patient here today for CT Scan results. He states that he feels so much better now that he is off the Xeloda. He denies having any pain. He wants to have his colostomy reversed and is asking about whether we can arrange that or does he need to call Dr. Burt Knack.

## 2017-09-15 NOTE — Progress Notes (Signed)
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 Patient is a 76 year old male who presented with evidence of bowel obstruction on 02/06/2017. At that time he had progressive abdominal distention and no bowel movement for about one week. CT abdomen showed an apple core lesion in the descending/sigmoid colon.  2. Patient underwent Hartmann's procedure for obstructing sigmoid colon mass on 02/06/2017. Preoperative CEA was 5.0  3. 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 It is 0 out of 15 lymph nodes were positive for malignancy. Perineural and lymphovascular invasion was present. Margins were negative.pT3N0. MMR stable.  4. Given that he had high risk stage II colon cancer including he presented with obstruction and has LVI and perineural invasion- adjuvant xeloda chemotherapy was recommended for 6 months.   5. Patient had evidence of hand foot syndrome after cycle 3. Cycle 4 delayed by 1 week. Topical urea cream prescribed  6. Patient completed 8 cycles of adjuvant xeloda in October 2018. polps seen in sigmoid and decending colon but negative for malignancy  7. Repeat Ct abdomen after 8 cycles showed no metastatic disease. 6 mm lung nodule noted in RLL    Interval history- doing well. Appetite is good. Denies any complaints  ECOG PS- 0 Pain scale- 0   Review of systems- Review of Systems  Constitutional: Negative for chills, fever, malaise/fatigue and weight loss.  HENT: Negative for congestion,  ear discharge and nosebleeds.   Eyes: Negative for blurred vision.  Respiratory: Negative for cough, hemoptysis, sputum production, shortness of breath and wheezing.   Cardiovascular: Negative for chest pain, palpitations, orthopnea and claudication.  Gastrointestinal: Negative for abdominal pain, blood in stool, constipation, diarrhea, heartburn, melena, nausea and vomiting.  Genitourinary: Negative for dysuria, flank pain, frequency, hematuria and urgency.  Musculoskeletal: Negative for back pain, joint pain and myalgias.  Skin: Negative for rash.  Neurological: Negative for dizziness, tingling, focal weakness, seizures, weakness and headaches.  Endo/Heme/Allergies: Does not bruise/bleed easily.  Psychiatric/Behavioral: Negative for depression and suicidal ideas. The patient does not have insomnia.        No Known Allergies   Past Medical History:  Diagnosis Date  . Cancer ([place])    skin cancer-nose w graft,also shoulder- basal cell per pt  . Cataract    r eye  . Chronic kidney disease    kidney stones 20 y ago per pt  . Colon cancer ([place])   . Diabetes mellitus without complication ([place])   . Hyperlipidemia   . Hypertension      Past Surgical History:  Procedure Laterality Date  . CYSTOSCOPY      Social History   Socioeconomic History  . Marital status: Married    Spouse name: Not on file  . Number of children: Not on file  . Years of education: Not on file  . Highest education level: Not on file  Social Needs  . Financial resource strain: Not on file  . Food insecurity - worry: Not on file  . Food insecurity - inability: Not on file  . Transportation needs - medical: Not on file  .  Transportation needs - non-medical: Not on file  Occupational History  . Not on file  Tobacco Use  . Smoking status: Never Smoker  . Smokeless tobacco: Never Used  Substance and Sexual Activity  . Alcohol use: No  . Drug use: No  . Sexual activity: Not Currently  Other Topics  Concern  . Not on file  Social History Narrative  . Not on file    Family History  Problem Relation Age of Onset  . Diabetes Mother   . AAA (abdominal aortic aneurysm) Mother   . Coronary artery disease Mother   . Diabetes Father   . Coronary artery disease Father   . Dementia Father   . Breast cancer Sister      Current Outpatient Medications:  .  Apremilast (OTEZLA) 30 MG TABS, Take 1 tablet by mouth daily., Disp: , Rfl:  .  Blood Glucose Monitoring Suppl (GLUCOCOM BLOOD GLUCOSE MONITOR) DEVI, 1 Device by Other route every morning., Disp: , Rfl:  .  Blood Glucose Monitoring Suppl (ONETOUCH VERIO) w/Device KIT, 1 each by XX route as directed. Dx: E11.9, Disp: , Rfl:  .  ferrous sulfate 324 (65 Fe) MG TBEC, Take 324 mg by mouth daily., Disp: , Rfl:  .  glimepiride (AMARYL) 2 MG tablet, Take 2 mg by mouth daily., Disp: , Rfl:  .  Lancets 30G MISC, Use 1 each as directed. Check CBG's fasting once daily. Dx: E11.9, Disp: , Rfl:  .  lisinopril (PRINIVIL,ZESTRIL) 5 MG tablet, Take 5 mg by mouth daily., Disp: , Rfl:  .  loperamide (IMODIUM A-D) 2 MG tablet, Take 2 mg by mouth 4 (four) times daily as needed for diarrhea or loose stools., Disp: , Rfl:  .  loratadine (CLARITIN) 10 MG tablet, Take 10 mg by mouth daily., Disp: , Rfl:  .  losartan (COZAAR) 25 MG tablet, Take 25 mg by mouth daily., Disp: , Rfl:  .  metFORMIN (GLUCOPHAGE) 1000 MG tablet, Take 1,000-1,500 tablets by mouth 2 (two) times daily. Take 1 tablet in AM and 1 1/2 tablets at supper, Disp: , Rfl:  .  ondansetron (ZOFRAN) 8 MG tablet, Take 1 tablet (8 mg total) by mouth 2 (two) times daily as needed (Nausea or vomiting)., Disp: 30 tablet, Rfl: 1 .  pravastatin (PRAVACHOL) 20 MG tablet, Take 20 mg by mouth daily., Disp: , Rfl:  .  prochlorperazine (COMPAZINE) 10 MG tablet, Take 1 tablet (10 mg total) by mouth every 6 (six) hours as needed (Nausea or vomiting). (Patient not taking: Reported on 08/26/2017), Disp: 30 tablet, Rfl:  1 .  urea (CARMOL) 10 % cream, Apply topically as needed. Apply to hands and feet two to three times a day as needed., Disp: 71 g, Rfl: 0  Physical exam:  Vitals:   09/15/17 1012  BP: 133/79  Pulse: 71  Resp: 16  Temp: (!) 97 F (36.1 C)  TempSrc: Tympanic  Weight: 189 lb (85.7 kg)   Physical Exam  Constitutional: He is oriented to person, place, and time and well-developed, well-nourished, and in no distress.  HENT:  Head: Normocephalic and atraumatic.  Eyes: EOM are normal. Pupils are equal, round, and reactive to light.  Neck: Normal range of motion.  Cardiovascular: Normal rate, regular rhythm and normal heart sounds.  Pulmonary/Chest: Effort normal and breath sounds normal.  Abdominal: Soft. Bowel sounds are normal.  Colostomy in place  Neurological: He is alert and oriented to person, place, and time.  Skin: Skin is warm and  dry.     CMP Latest Ref Rng & Units 08/14/2017  Glucose 65 - 99 mg/dL 129(H)  BUN 6 - 20 mg/dL 19  Creatinine 0.61 - 1.24 mg/dL 1.10  Sodium 135 - 145 mmol/L 136  Potassium 3.5 - 5.1 mmol/L 4.0  Chloride 101 - 111 mmol/L 106  CO2 22 - 32 mmol/L 22  Calcium 8.9 - 10.3 mg/dL 9.3  Total Protein 6.5 - 8.1 g/dL 7.4  Total Bilirubin 0.3 - 1.2 mg/dL 0.4  Alkaline Phos 38 - 126 U/L 73  AST 15 - 41 U/L 22  ALT 17 - 63 U/L 14(L)   CBC Latest Ref Rng & Units 08/14/2017  WBC 3.8 - 10.6 K/uL 8.7  Hemoglobin 13.0 - 18.0 g/dL 13.3  Hematocrit 40.0 - 52.0 % 37.5(L)  Platelets 150 - 440 K/uL 245    No images are attached to the encounter.  Ct Abdomen Pelvis W Contrast  Result Date: 09/14/2017 CLINICAL DATA:  Colon cancer. EXAM: CT ABDOMEN AND PELVIS WITH CONTRAST TECHNIQUE: Multidetector CT imaging of the abdomen and pelvis was performed using the standard protocol following bolus administration of intravenous contrast. CONTRAST:  135m ISOVUE-300 IOPAMIDOL (ISOVUE-300) INJECTION 61% COMPARISON:  02/05/2017 FINDINGS: Lower chest: 6 mm pulmonary nodule  identified posterior right costophrenic sulcus (image 16 series 5), new in the interval 3 mm right lower lobe nodule seen on image 6 is stable since prior study. Hepatobiliary: No focal abnormality within the liver parenchyma. Calcified gallstones measure up to 8 mm diameter. No intrahepatic or extrahepatic biliary dilation. Pancreas: No focal mass lesion. No dilatation of the main duct. No intraparenchymal cyst. No peripancreatic edema. Spleen: No splenomegaly. No focal mass lesion. Adrenals/Urinary Tract: No adrenal nodule or mass. 1 mm nonobstructing stone identified upper pole right kidney (image 23 series 2). Left kidney unremarkable. No evidence for hydroureter. The urinary bladder appears normal for the degree of distention. Stomach/Bowel: Stomach is nondistended. No gastric wall thickening. No evidence of outlet obstruction. Duodenum is normally positioned as is the ligament of Treitz. No small bowel wall thickening. No small bowel dilatation. The terminal ileum is normal. The appendix is normal. Left lower quadrant sigmoid end colostomy noted with associated Hartmann's pouch. Vascular/Lymphatic: There is abdominal aortic atherosclerosis without aneurysm. Small lymph nodes in the hepatoduodenal ligament are similar to prior. There is no gastrohepatic or hepatoduodenal ligament lymphadenopathy. No intraperitoneal or retroperitoneal lymphadenopathy. No pelvic sidewall lymphadenopathy. Reproductive: Dystrophic calcification noted central prostate gland. Other: No intraperitoneal free fluid. Musculoskeletal: Small bilateral groin hernias contain only fat. Bone windows reveal no worrisome lytic or sclerotic osseous lesions. Lucent lesions in the T10 and L3 vertebral bodies are stable in the interval. IMPRESSION: 1. Interval sigmoid colectomy with left lower quadrant end colostomy. 2. No evidence for metastatic disease in the abdomen or pelvis. 3. 6 mm pulmonary nodule identified posterior right lower lobe is new  in the interval. Nonspecific, but close attention warranted on follow-up imaging. 4. Tiny lesions in the T10 and L3 vertebral bodies identified on the prior study are unchanged in the interval. 5. Tiny nonobstructing stone upper pole right kidney. 6. Cholelithiasis. 7.  Aortic Atherosclerois (ICD10-170.0) Electronically Signed   By: EMisty StanleyM.D.   On: 09/14/2017 16:36     Assessment and plan- Patient is a 76y.o. male with stage II colon cancer T3N0M0 s/p surgery currently on adjuvant xeloda s/p 8cycles  Patient has completed 8 cycles of adjuvant xeloda for Stage II colon cancer. Repeat CT abdomen shows no evidence  of metastatic disease. I have personally reviewed Ct abdomen images and discussed findings with the patient. There is a new 6 mm RLL nodule that is likely non specific but we will continue to follow that  Repeat CT chest abdomen pelvis in 6 months. Cbc, cmp and CEA. He will see me a week after  Patient has also completed his colonoscopy which did not show any malignancy. He will need repeat colonoscopy at 1 year and 3 years and then every 5 years per nccn guidelines.  He can proceed with colostomy takedown at this time. I will touch base with Dr. Burt Knack regarding this   Visit Diagnosis 1. 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[place])   2. Lung nodule      Dr. Randa Evens, MD, MPH [place] [facility] Pager- [phone] 09/15/2017 10:33 AM

## 2017-09-15 NOTE — Telephone Encounter (Signed)
Appt.schedule for Lab/MD per 09/15/17 los. will be mailed out to patient.

## 2017-09-18 ENCOUNTER — Telehealth: Payer: Self-pay | Admitting: *Deleted

## 2017-09-18 NOTE — Telephone Encounter (Signed)
Called [facility] and got an appointment for Miguel Flores.  Angie gave me an appointment for November 28 which is a Wednesday at 9:30.  Called patient to let him know about the appointment and his surgery date will be sometime the week of December 10.  Patient understands  where office is located and will be at the appt on that date

## 2017-10-07 ENCOUNTER — Encounter: Payer: Self-pay | Admitting: Surgery

## 2017-10-07 ENCOUNTER — Ambulatory Visit (INDEPENDENT_AMBULATORY_CARE_PROVIDER_SITE_OTHER): Payer: Medicare Other | Admitting: Surgery

## 2017-10-07 VITALS — BP 160/66 | HR 75 | Temp 97.6°F | Ht 66.0 in | Wt 191.2 lb

## 2017-10-07 DIAGNOSIS — C186 Malignant neoplasm of descending colon: Secondary | ICD-10-CM

## 2017-10-07 MED ORDER — POLYETHYLENE GLYCOL 3350 17 GM/SCOOP PO POWD: 1.0000 | Freq: Once | ORAL | 0 refills | Status: AC

## 2017-10-07 MED ORDER — BISACODYL 5 MG PO TBEC: 5.0000 mg | DELAYED_RELEASE_TABLET | Freq: Once | ORAL | 0 refills | Status: AC

## 2017-10-07 MED ORDER — POLYETHYLENE GLYCOL 3350 17 GM/SCOOP PO POWD: 1.0000 | Freq: Once | ORAL | Status: DC

## 2017-10-07 NOTE — Addendum Note (Signed)
Addended by: Celene Kras on: 10/07/2017 10:09 AM   Modules accepted: Orders

## 2017-10-07 NOTE — Progress Notes (Signed)
Outpatient Surgical Follow Up  10/07/2017  Miguel Flores is an 76 y.o. male.   UR:[id number]  HPI: This patient is status post end colostomy placement and colon cancer resection of the sigmoid colon.  He is undergone therapy.  He wishes to have his colostomy reversed.  Past Medical History:  Diagnosis Date  . Cancer ([place])    skin cancer-nose w graft,also shoulder- basal cell per pt  . Cataract    r eye  . Chronic kidney disease    kidney stones 20 y ago per pt  . Colon cancer ([place])   . Diabetes mellitus without complication ([place])   . Hyperlipidemia   . Hypertension     Past Surgical History:  Procedure Laterality Date  . COLECTOMY WITH COLOSTOMY CREATION/HARTMANN PROCEDURE N/A 02/06/2017   Procedure: COLECTOMY WITH COLOSTOMY CREATION/HARTMANN PROCEDURE;  Surgeon: [name], MD;  Location: [facility] ORS;  Service: General;  Laterality: N/A;  . COLONOSCOPY WITH PROPOFOL N/A 09/04/2017   Procedure: COLONOSCOPY WITH PROPOFOL;  Surgeon: Jonathon Bellows, MD;  Location: [facility] ENDOSCOPY;  Service: Gastroenterology;  Laterality: N/A;  . CYSTOSCOPY    . FLEXIBLE SIGMOIDOSCOPY N/A 02/06/2017   Procedure: FLEXIBLE SIGMOIDOSCOPY;  Surgeon: Christene Lye, MD;  Location: [facility] ENDOSCOPY;  Service: Endoscopy;  Laterality: N/A;    Family History  Problem Relation Age of Onset  . Diabetes Mother   . AAA (abdominal aortic aneurysm) Mother   . Coronary artery disease Mother   . Diabetes Father   . Coronary artery disease Father   . Dementia Father   . Breast cancer Sister     Social History:  reports that  has never smoked. he has never used smokeless tobacco. He reports that he does not drink alcohol or use drugs.  Allergies: No Known Allergies  Medications reviewed.   Review of Systems:   Review of Systems  Constitutional: Negative.   HENT: Negative.   Eyes: Negative.   Respiratory: Negative.   Cardiovascular: Negative.   Gastrointestinal: Negative for abdominal pain,  blood in stool, constipation, diarrhea, heartburn, nausea and vomiting.  Genitourinary: Negative.   Musculoskeletal: Negative.   Skin: Negative.   Neurological: Negative.   Endo/Heme/Allergies: Negative.   Psychiatric/Behavioral: Negative.      Physical Exam:  There were no vitals taken for this visit.  Physical Exam  Constitutional: He is oriented to person, place, and time and well-developed, well-nourished, and in no distress. No distress.  HENT:  Head: Normocephalic and atraumatic.  Scar on left forehead  Eyes: Pupils are equal, round, and reactive to light. Right eye exhibits no discharge. Left eye exhibits no discharge. No scleral icterus.  Neck: Normal range of motion. No JVD present.  Cardiovascular: Normal rate, regular rhythm and normal heart sounds.  Pulmonary/Chest: Effort normal and breath sounds normal. No respiratory distress. He has no wheezes. He has no rales.  Abdominal: Soft. He exhibits no distension. There is no tenderness. There is no rebound and no guarding.  Left lower quadrant pink functional colostomy Midline incision well-healed no hernia  Musculoskeletal: Normal range of motion. He exhibits no edema or tenderness.  Lymphadenopathy:    He has no cervical adenopathy.  Neurological: He is alert and oriented to person, place, and time.  Skin: Skin is warm and dry. No rash noted. He is not diaphoretic. No erythema.  Psychiatric: Mood and affect normal.  Vitals reviewed.     No results found for this or any previous visit (from the past 48 hour(s)). No results  found.  Assessment/Plan:  This patient with colon cancer.  He has been told that he is "cancer free".  I recommend colostomy closure with the assistance of Dr. Genevive Bi.  His most recent labs been reviewed.  And he had a colonoscopy a few weeks ago and that showed only small polyps.  He is a good candidate for closure at this time.  I discussed with him the procedure and the risks of bleeding  infection anastomotic leak further surgery including ileostomy which would be temporary.  He understood and agreed to proceed.  [name], MD, FACS

## 2017-10-07 NOTE — Patient Instructions (Addendum)
We will schedule your surgery  For 10/1117.  Please see your blue pre-care sheet for surgery information.  Please see your bowel prep instruction sheet.  Please call our office if you have any questions or concerns.

## 2017-10-08 ENCOUNTER — Telehealth: Payer: Self-pay

## 2017-10-08 NOTE — Telephone Encounter (Signed)
Medical and Anticoagulant Clearance faxed to DR Linthvang at this time.

## 2017-10-12 ENCOUNTER — Telehealth: Payer: Self-pay | Admitting: Surgery

## 2017-10-12 ENCOUNTER — Other Ambulatory Visit: Payer: Self-pay

## 2017-10-12 ENCOUNTER — Encounter
Admission: RE | Admit: 2017-10-12 | Discharge: 2017-10-12 | Disposition: A | Discharge status: Home / Self Care | Payer: Medicare Other | Source: Ambulatory Visit | Attending: Surgery | Admitting: Surgery

## 2017-10-12 DIAGNOSIS — C4490 Unspecified malignant neoplasm of skin, unspecified: Secondary | ICD-10-CM | POA: Insufficient documentation

## 2017-10-12 DIAGNOSIS — Z01812 Encounter for preprocedural laboratory examination: Secondary | ICD-10-CM | POA: Diagnosis not present

## 2017-10-12 DIAGNOSIS — Z809 Family history of malignant neoplasm, unspecified: Secondary | ICD-10-CM | POA: Diagnosis not present

## 2017-10-12 DIAGNOSIS — Z01810 Encounter for preprocedural cardiovascular examination: Secondary | ICD-10-CM | POA: Diagnosis present

## 2017-10-12 DIAGNOSIS — I129 Hypertensive chronic kidney disease with stage 1 through stage 4 chronic kidney disease, or unspecified chronic kidney disease: Secondary | ICD-10-CM | POA: Insufficient documentation

## 2017-10-12 DIAGNOSIS — Z833 Family history of diabetes mellitus: Secondary | ICD-10-CM | POA: Diagnosis not present

## 2017-10-12 DIAGNOSIS — Z8249 Family history of ischemic heart disease and other diseases of the circulatory system: Secondary | ICD-10-CM | POA: Insufficient documentation

## 2017-10-12 DIAGNOSIS — N189 Chronic kidney disease, unspecified: Secondary | ICD-10-CM | POA: Diagnosis not present

## 2017-10-12 DIAGNOSIS — E1122 Type 2 diabetes mellitus with diabetic chronic kidney disease: Secondary | ICD-10-CM | POA: Insufficient documentation

## 2017-10-12 DIAGNOSIS — Z433 Encounter for attention to colostomy: Secondary | ICD-10-CM | POA: Diagnosis not present

## 2017-10-12 DIAGNOSIS — Z820 Family history of epilepsy and other diseases of the nervous system: Secondary | ICD-10-CM | POA: Insufficient documentation

## 2017-10-12 DIAGNOSIS — Z9889 Other specified postprocedural states: Secondary | ICD-10-CM | POA: Insufficient documentation

## 2017-10-12 DIAGNOSIS — C189 Malignant neoplasm of colon, unspecified: Secondary | ICD-10-CM | POA: Insufficient documentation

## 2017-10-12 HISTORY — DX: Dermatitis, unspecified: L30.9

## 2017-10-12 HISTORY — DX: Personal history of urinary calculi: Z87.442

## 2017-10-12 LAB — COMPREHENSIVE METABOLIC PANEL
ALBUMIN: 4.1 g/dL (ref 3.5–5.0)
ALK PHOS: 67 U/L (ref 38–126)
ALT: 21 U/L (ref 17–63)
ANION GAP: 10 (ref 5–15)
AST: 22 U/L (ref 15–41)
BUN: 17 mg/dL (ref 6–20)
CALCIUM: 9.5 mg/dL (ref 8.9–10.3)
CO2: 20 mmol/L — ABNORMAL LOW (ref 22–32)
Chloride: 106 mmol/L (ref 101–111)
Creatinine, Ser: 1.17 mg/dL (ref 0.61–1.24)
GFR, EST NON AFRICAN AMERICAN: 58 mL/min — AB (ref >60)
Glucose, Bld: 203 mg/dL — ABNORMAL HIGH (ref 65–99)
POTASSIUM: 4.2 mmol/L (ref 3.5–5.1)
SODIUM: 136 mmol/L (ref 135–145)
TOTAL PROTEIN: 7.5 g/dL (ref 6.5–8.1)
Total Bilirubin: 0.4 mg/dL (ref 0.3–1.2)

## 2017-10-12 LAB — CBC WITH DIFFERENTIAL/PLATELET
BASOS PCT: 1 %
Basophils Absolute: 0.1 10*3/uL (ref 0–0.1)
EOS ABS: 0.3 10*3/uL (ref 0–0.7)
Eosinophils Relative: 4 %
HCT: 42.6 % (ref 40.0–52.0)
HEMOGLOBIN: 14.4 g/dL (ref 13.0–18.0)
Lymphocytes Relative: 28 %
Lymphs Abs: 2.2 10*3/uL (ref 1.0–3.6)
MCH: 33.8 pg (ref 26.0–34.0)
MCHC: 33.8 g/dL (ref 32.0–36.0)
MCV: 100.2 fL — ABNORMAL HIGH (ref 80.0–100.0)
MONOS PCT: 7 %
Monocytes Absolute: 0.5 10*3/uL (ref 0.2–1.0)
NEUTROS PCT: 60 %
Neutro Abs: 4.7 10*3/uL (ref 1.4–6.5)
Platelets: 263 10*3/uL (ref 150–440)
RBC: 4.25 MIL/uL — ABNORMAL LOW (ref 4.40–5.90)
RDW: 14.7 % — ABNORMAL HIGH (ref 11.5–14.5)
WBC: 7.7 10*3/uL (ref 3.8–10.6)

## 2017-10-12 LAB — SURGICAL PCR SCREEN
MRSA, PCR: NEGATIVE
Staphylococcus aureus: NEGATIVE

## 2017-10-12 NOTE — Telephone Encounter (Signed)
Pt advised of pre op date/time and sx date. Sx: 10/20/17 with Dr Cooper--colostomy closure  Pre op: 10/12/17 @ 9:45am--Office interview.   Patient made aware to call [phone], between 1-3:00pm the day before surgery, to find out what time to arrive.

## 2017-10-12 NOTE — Patient Instructions (Signed)
Your procedure is scheduled on: Tuesday, October 20, 2017 Report to Same Day Surgery on the 2nd floor in the [facility]. To find out your arrival time, please call [phone] between 1PM - 3PM on: Monday, October 19, 2017  REMEMBER: Instructions that are not followed completely may result in serious medical risk, up to and including death; or upon the discretion of your surgeon and anesthesiologist your surgery may need to be rescheduled.  Do not eat food after midnight the night before your procedure.  No gum chewing or hard candies.  You may however, drink water only up to 2 hours before you are scheduled to arrive at the hospital for your procedure.  Do not drink water within 2 hours of the start of your surgery.  No Alcohol for 24 hours before or after surgery.  No Smoking including e-cigarettes for 24 hours prior to surgery. No chewable tobacco products for at least 6 hours prior to surgery. No nicotine patches on the day of surgery.  Notify your doctor if there is any change in your medical condition (cold, fever, infection).  Do not wear jewelry, make-up, hairpins, clips or nail polish.  Do not wear lotions, powders, or perfumes. You may wear deodorant.  Do not shave 48 hours prior to surgery. Men may shave face and neck.  Contacts and dentures may not be worn into surgery.  Do not bring valuables to the hospital. [facility] is not responsible for any belongings or valuables.   TAKE THESE MEDICATIONS THE MORNING OF SURGERY WITH A SIP OF WATER:  NONE  Use CHG wipes as directed on instruction sheet.  Fleets enema AS DIRECTED.  Stop Metformin 2 days prior to surgery. LAST DOSE TO BE TAKEN ON Saturday, December 8  NOW!  Stop ASPIRIN and Anti-inflammatories such as Advil, Aleve, Ibuprofen, Motrin, Naproxen, Naprosyn, Goodie powder, or aspirin products. (May take Tylenol or Acetaminophen if needed.)  Stop ANY OVER THE COUNTER supplements until after surgery.  (Iron)  If you are being admitted to the hospital overnight, leave your suitcase in the car. After surgery it may be brought to your room.  Please call the number above if you have any questions about these instructions.

## 2017-10-14 ENCOUNTER — Telehealth: Payer: Self-pay

## 2017-10-14 NOTE — Telephone Encounter (Signed)
Medical Clearance obtain and approved at this time. Clearance can be found under media tab in patient's chart.

## 2017-10-19 MED ORDER — SODIUM CHLORIDE 0.9 % IV SOLN
1.0000 g | INTRAVENOUS | Status: AC
  Administered 2017-10-20: 1 g via INTRAVENOUS
  Filled 2017-10-19: qty 1

## 2017-10-20 ENCOUNTER — Inpatient Hospital Stay: Payer: Medicare Other | Admitting: Certified Registered"

## 2017-10-20 ENCOUNTER — Inpatient Hospital Stay
Admission: RE | Admit: 2017-10-20 | Discharge: 2017-10-24 | DRG: 330 | Disposition: A | Discharge status: Home / Self Care | Payer: Medicare Other | Source: Ambulatory Visit | Attending: Surgery | Admitting: Surgery

## 2017-10-20 ENCOUNTER — Encounter: Payer: Self-pay | Admitting: *Deleted

## 2017-10-20 ENCOUNTER — Encounter
Admission: RE | Disposition: A | Discharge status: Home / Self Care | Payer: Self-pay | Source: Ambulatory Visit | Attending: Surgery

## 2017-10-20 ENCOUNTER — Other Ambulatory Visit: Payer: Self-pay

## 2017-10-20 DIAGNOSIS — Z85038 Personal history of other malignant neoplasm of large intestine: Secondary | ICD-10-CM | POA: Diagnosis not present

## 2017-10-20 DIAGNOSIS — C187 Malignant neoplasm of sigmoid colon: Secondary | ICD-10-CM

## 2017-10-20 DIAGNOSIS — K91870 Postprocedural hematoma of a digestive system organ or structure following a digestive system procedure: Secondary | ICD-10-CM | POA: Diagnosis not present

## 2017-10-20 DIAGNOSIS — K660 Peritoneal adhesions (postprocedural) (postinfection): Secondary | ICD-10-CM | POA: Diagnosis present

## 2017-10-20 DIAGNOSIS — Y834 Other reconstructive surgery as the cause of abnormal reaction of the patient, or of later complication, without mention of misadventure at the time of the procedure: Secondary | ICD-10-CM | POA: Diagnosis not present

## 2017-10-20 DIAGNOSIS — N189 Chronic kidney disease, unspecified: Secondary | ICD-10-CM | POA: Diagnosis present

## 2017-10-20 DIAGNOSIS — Z9221 Personal history of antineoplastic chemotherapy: Secondary | ICD-10-CM

## 2017-10-20 DIAGNOSIS — Z7984 Long term (current) use of oral hypoglycemic drugs: Secondary | ICD-10-CM | POA: Diagnosis not present

## 2017-10-20 DIAGNOSIS — E785 Hyperlipidemia, unspecified: Secondary | ICD-10-CM | POA: Diagnosis present

## 2017-10-20 DIAGNOSIS — Y92234 Operating room of hospital as the place of occurrence of the external cause: Secondary | ICD-10-CM | POA: Diagnosis not present

## 2017-10-20 DIAGNOSIS — D631 Anemia in chronic kidney disease: Secondary | ICD-10-CM | POA: Diagnosis present

## 2017-10-20 DIAGNOSIS — Z85828 Personal history of other malignant neoplasm of skin: Secondary | ICD-10-CM | POA: Diagnosis not present

## 2017-10-20 DIAGNOSIS — M7918 Myalgia, other site: Secondary | ICD-10-CM | POA: Diagnosis present

## 2017-10-20 DIAGNOSIS — E1122 Type 2 diabetes mellitus with diabetic chronic kidney disease: Secondary | ICD-10-CM | POA: Diagnosis present

## 2017-10-20 DIAGNOSIS — C186 Malignant neoplasm of descending colon: Secondary | ICD-10-CM | POA: Diagnosis present

## 2017-10-20 DIAGNOSIS — Z791 Long term (current) use of non-steroidal anti-inflammatories (NSAID): Secondary | ICD-10-CM | POA: Diagnosis not present

## 2017-10-20 DIAGNOSIS — S301XXA Contusion of abdominal wall, initial encounter: Secondary | ICD-10-CM | POA: Diagnosis not present

## 2017-10-20 DIAGNOSIS — E119 Type 2 diabetes mellitus without complications: Secondary | ICD-10-CM

## 2017-10-20 DIAGNOSIS — I129 Hypertensive chronic kidney disease with stage 1 through stage 4 chronic kidney disease, or unspecified chronic kidney disease: Secondary | ICD-10-CM | POA: Diagnosis present

## 2017-10-20 DIAGNOSIS — Z79899 Other long term (current) drug therapy: Secondary | ICD-10-CM | POA: Diagnosis not present

## 2017-10-20 DIAGNOSIS — M25552 Pain in left hip: Secondary | ICD-10-CM | POA: Diagnosis present

## 2017-10-20 DIAGNOSIS — Z433 Encounter for attention to colostomy: Secondary | ICD-10-CM | POA: Diagnosis present

## 2017-10-20 DIAGNOSIS — Z7982 Long term (current) use of aspirin: Secondary | ICD-10-CM | POA: Diagnosis not present

## 2017-10-20 DIAGNOSIS — C189 Malignant neoplasm of colon, unspecified: Secondary | ICD-10-CM | POA: Diagnosis present

## 2017-10-20 HISTORY — PX: COLOSTOMY CLOSURE: SHX1381

## 2017-10-20 LAB — GLUCOSE, CAPILLARY
GLUCOSE-CAPILLARY: 220 mg/dL — AB (ref 65–99)
Glucose-Capillary: 196 mg/dL — ABNORMAL HIGH (ref 65–99)
Glucose-Capillary: 209 mg/dL — ABNORMAL HIGH (ref 65–99)
Glucose-Capillary: 233 mg/dL — ABNORMAL HIGH (ref 65–99)

## 2017-10-20 LAB — CBC
HCT: 45.8 % (ref 40.0–52.0)
Hemoglobin: 15.2 g/dL (ref 13.0–18.0)
MCH: 32.9 pg (ref 26.0–34.0)
MCHC: 33.2 g/dL (ref 32.0–36.0)
MCV: 99 fL (ref 80.0–100.0)
PLATELETS: 285 10*3/uL (ref 150–440)
RBC: 4.62 MIL/uL (ref 4.40–5.90)
RDW: 14.6 % — ABNORMAL HIGH (ref 11.5–14.5)
WBC: 16.3 10*3/uL — AB (ref 3.8–10.6)

## 2017-10-20 LAB — CREATININE, SERUM: CREATININE: 1.06 mg/dL (ref 0.61–1.24)

## 2017-10-20 SURGERY — COLOSTOMY CLOSURE
Anesthesia: General | Wound class: Clean Contaminated

## 2017-10-20 MED ORDER — FAMOTIDINE 20 MG PO TABS
ORAL_TABLET | ORAL | Status: AC
  Filled 2017-10-20: qty 1

## 2017-10-20 MED ORDER — SUGAMMADEX SODIUM 200 MG/2ML IV SOLN
INTRAVENOUS | Status: DC | PRN
  Administered 2017-10-20: 175 mg via INTRAVENOUS

## 2017-10-20 MED ORDER — FENTANYL CITRATE (PF) 100 MCG/2ML IJ SOLN
INTRAMUSCULAR | Status: AC
  Filled 2017-10-20: qty 2

## 2017-10-20 MED ORDER — HYDROMORPHONE HCL 1 MG/ML IJ SOLN
0.5000 mg | INTRAMUSCULAR | Status: DC | PRN
  Administered 2017-10-20 (×2): 0.5 mg via INTRAVENOUS

## 2017-10-20 MED ORDER — BUPIVACAINE-EPINEPHRINE (PF) 0.25% -1:200000 IJ SOLN
INTRAMUSCULAR | Status: AC
  Filled 2017-10-20: qty 30

## 2017-10-20 MED ORDER — ACETAMINOPHEN 10 MG/ML IV SOLN
INTRAVENOUS | Status: AC
  Filled 2017-10-20: qty 100

## 2017-10-20 MED ORDER — HEPARIN SODIUM (PORCINE) 5000 UNIT/ML IJ SOLN
5000.0000 [IU] | Freq: Three times a day (TID) | INTRAMUSCULAR | Status: DC
  Administered 2017-10-20 – 2017-10-24 (×12): 5000 [IU] via SUBCUTANEOUS
  Filled 2017-10-20 (×12): qty 1

## 2017-10-20 MED ORDER — PRAVASTATIN SODIUM 20 MG PO TABS
20.0000 mg | ORAL_TABLET | Freq: Every day | ORAL | Status: DC
  Administered 2017-10-20 – 2017-10-24 (×5): 20 mg via ORAL
  Filled 2017-10-20 (×5): qty 1

## 2017-10-20 MED ORDER — SODIUM CHLORIDE 0.9 % IV SOLN
INTRAVENOUS | Status: DC
  Administered 2017-10-20: 10:00:00 via INTRAVENOUS

## 2017-10-20 MED ORDER — FENTANYL CITRATE (PF) 100 MCG/2ML IJ SOLN
25.0000 ug | INTRAMUSCULAR | Status: DC | PRN
  Administered 2017-10-20 (×4): 25 ug via INTRAVENOUS

## 2017-10-20 MED ORDER — FENTANYL CITRATE (PF) 100 MCG/2ML IJ SOLN
INTRAMUSCULAR | Status: DC | PRN
  Administered 2017-10-20 (×4): 50 ug via INTRAVENOUS
  Administered 2017-10-20: 100 ug via INTRAVENOUS

## 2017-10-20 MED ORDER — HYDROMORPHONE HCL 1 MG/ML IJ SOLN
INTRAMUSCULAR | Status: AC
  Filled 2017-10-20: qty 1

## 2017-10-20 MED ORDER — LORATADINE 10 MG PO TABS
10.0000 mg | ORAL_TABLET | Freq: Every day | ORAL | Status: DC
  Administered 2017-10-20 – 2017-10-24 (×5): 10 mg via ORAL
  Filled 2017-10-20 (×5): qty 1

## 2017-10-20 MED ORDER — OXYCODONE-ACETAMINOPHEN 5-325 MG PO TABS
1.0000 | ORAL_TABLET | ORAL | Status: DC | PRN
  Administered 2017-10-22 (×2): 2 via ORAL
  Administered 2017-10-22: 1 via ORAL
  Administered 2017-10-23 (×2): 2 via ORAL
  Filled 2017-10-20 (×2): qty 2
  Filled 2017-10-20: qty 1
  Filled 2017-10-20 (×2): qty 2

## 2017-10-20 MED ORDER — CHLORHEXIDINE GLUCONATE CLOTH 2 % EX PADS: 6.0000 | MEDICATED_PAD | Freq: Once | CUTANEOUS | Status: DC

## 2017-10-20 MED ORDER — METFORMIN HCL 500 MG PO TABS
1000.0000 mg | ORAL_TABLET | Freq: Two times a day (BID) | ORAL | Status: DC
  Administered 2017-10-20 – 2017-10-21 (×2): 1000 mg via ORAL
  Filled 2017-10-20 (×2): qty 2

## 2017-10-20 MED ORDER — MORPHINE SULFATE (PF) 4 MG/ML IV SOLN
2.0000 mg | INTRAVENOUS | Status: DC | PRN
  Administered 2017-10-20 – 2017-10-21 (×3): 2 mg via INTRAVENOUS
  Filled 2017-10-20 (×3): qty 1

## 2017-10-20 MED ORDER — APREMILAST 30 MG PO TABS: 30.0000 mg | ORAL_TABLET | Freq: Every day | ORAL | Status: DC

## 2017-10-20 MED ORDER — SUGAMMADEX SODIUM 200 MG/2ML IV SOLN
INTRAVENOUS | Status: AC
  Filled 2017-10-20: qty 2

## 2017-10-20 MED ORDER — INSULIN ASPART 100 UNIT/ML ~~LOC~~ SOLN
0.0000 [IU] | Freq: Three times a day (TID) | SUBCUTANEOUS | Status: DC
  Administered 2017-10-20 – 2017-10-21 (×3): 3 [IU] via SUBCUTANEOUS
  Filled 2017-10-20 (×3): qty 1

## 2017-10-20 MED ORDER — LIDOCAINE HCL (PF) 2 % IJ SOLN
INTRAMUSCULAR | Status: AC
  Filled 2017-10-20: qty 10

## 2017-10-20 MED ORDER — FENTANYL CITRATE (PF) 100 MCG/2ML IJ SOLN
INTRAMUSCULAR | Status: AC
  Administered 2017-10-20: 25 ug via INTRAVENOUS
  Filled 2017-10-20: qty 2

## 2017-10-20 MED ORDER — PROPOFOL 10 MG/ML IV BOLUS
INTRAVENOUS | Status: AC
  Filled 2017-10-20: qty 20

## 2017-10-20 MED ORDER — ONDANSETRON HCL 4 MG/2ML IJ SOLN
INTRAMUSCULAR | Status: DC | PRN
  Administered 2017-10-20: 4 mg via INTRAVENOUS

## 2017-10-20 MED ORDER — PROPOFOL 10 MG/ML IV BOLUS
INTRAVENOUS | Status: DC | PRN
  Administered 2017-10-20: 140 mg via INTRAVENOUS

## 2017-10-20 MED ORDER — HYDROMORPHONE HCL 1 MG/ML IJ SOLN
INTRAMUSCULAR | Status: AC
  Administered 2017-10-20: 0.5 mg via INTRAVENOUS
  Filled 2017-10-20: qty 1

## 2017-10-20 MED ORDER — HEPARIN SODIUM (PORCINE) 5000 UNIT/ML IJ SOLN
5000.0000 [IU] | Freq: Once | INTRAMUSCULAR | Status: AC
  Administered 2017-10-20: 5000 [IU] via SUBCUTANEOUS

## 2017-10-20 MED ORDER — LOSARTAN POTASSIUM 25 MG PO TABS
25.0000 mg | ORAL_TABLET | Freq: Every day | ORAL | Status: DC
  Administered 2017-10-20 – 2017-10-24 (×5): 25 mg via ORAL
  Filled 2017-10-20 (×6): qty 1

## 2017-10-20 MED ORDER — ASPIRIN EC 81 MG PO TBEC
81.0000 mg | DELAYED_RELEASE_TABLET | Freq: Every day | ORAL | Status: DC
  Administered 2017-10-20 – 2017-10-24 (×5): 81 mg via ORAL
  Filled 2017-10-20 (×5): qty 1

## 2017-10-20 MED ORDER — ROCURONIUM BROMIDE 100 MG/10ML IV SOLN
INTRAVENOUS | Status: DC | PRN
  Administered 2017-10-20: 40 mg via INTRAVENOUS
  Administered 2017-10-20: 5 mg via INTRAVENOUS
  Administered 2017-10-20: 10 mg via INTRAVENOUS

## 2017-10-20 MED ORDER — SUCCINYLCHOLINE CHLORIDE 20 MG/ML IJ SOLN
INTRAMUSCULAR | Status: DC | PRN
  Administered 2017-10-20: 100 mg via INTRAVENOUS

## 2017-10-20 MED ORDER — ONDANSETRON HCL 4 MG/2ML IJ SOLN
INTRAMUSCULAR | Status: AC
  Filled 2017-10-20: qty 2

## 2017-10-20 MED ORDER — HEPARIN SODIUM (PORCINE) 5000 UNIT/ML IJ SOLN
INTRAMUSCULAR | Status: AC
  Filled 2017-10-20: qty 1

## 2017-10-20 MED ORDER — FAMOTIDINE 20 MG PO TABS
20.0000 mg | ORAL_TABLET | Freq: Once | ORAL | Status: AC
  Administered 2017-10-20: 20 mg via ORAL

## 2017-10-20 MED ORDER — LACTATED RINGERS IV SOLN
INTRAVENOUS | Status: DC
  Administered 2017-10-20 – 2017-10-22 (×5): via INTRAVENOUS

## 2017-10-20 MED ORDER — BUPIVACAINE-EPINEPHRINE (PF) 0.25% -1:200000 IJ SOLN
INTRAMUSCULAR | Status: DC | PRN
  Administered 2017-10-20: 30 mL

## 2017-10-20 MED ORDER — SODIUM CHLORIDE FLUSH 0.9 % IV SOLN
INTRAVENOUS | Status: AC
  Administered 2017-10-20: 15:00:00
  Filled 2017-10-20: qty 10

## 2017-10-20 MED ORDER — LIDOCAINE HCL (CARDIAC) 20 MG/ML IV SOLN
INTRAVENOUS | Status: DC | PRN
  Administered 2017-10-20: 100 mg via INTRAVENOUS

## 2017-10-20 MED ORDER — GLIMEPIRIDE 2 MG PO TABS
2.0000 mg | ORAL_TABLET | Freq: Every day | ORAL | Status: DC
  Administered 2017-10-20: 2 mg via ORAL
  Filled 2017-10-20 (×2): qty 1

## 2017-10-20 MED ORDER — ONDANSETRON HCL 4 MG/2ML IJ SOLN: 4.0000 mg | Freq: Once | INTRAMUSCULAR | Status: DC | PRN

## 2017-10-20 SURGICAL SUPPLY — 39 items
CANISTER SUCT 1200ML W/VALVE (MISCELLANEOUS) ×3 IMPLANT
DRAPE INCISE IOBAN 66X45 STRL (DRAPES) ×3 IMPLANT
DRAPE LAPAROTOMY 100X77 ABD (DRAPES) ×3 IMPLANT
DRAPE LEGGINS SURG 28X43 STRL (DRAPES) ×3 IMPLANT
DRAPE TABLE BACK 80X90 (DRAPES) ×3 IMPLANT
DRAPE UNDER BUTTOCK W/FLU (DRAPES) ×3 IMPLANT
DRAPE UTILITY 15X26 TOWEL STRL (DRAPES) ×6 IMPLANT
DRSG OPSITE POSTOP 4X14 (GAUZE/BANDAGES/DRESSINGS) ×3 IMPLANT
DRSG OPSITE POSTOP 4X6 (GAUZE/BANDAGES/DRESSINGS) ×3 IMPLANT
ELECT BLADE 6.5 EXT (BLADE) ×3 IMPLANT
ELECT CAUTERY BLADE 6.4 (BLADE) ×3 IMPLANT
ELECT REM PT RETURN 9FT ADLT (ELECTROSURGICAL) ×3
ELECTRODE REM PT RTRN 9FT ADLT (ELECTROSURGICAL) ×1 IMPLANT
GLOVE BIO SURGEON STRL SZ8 (GLOVE) ×12 IMPLANT
GOWN STRL REUS W/ TWL LRG LVL3 (GOWN DISPOSABLE) ×4 IMPLANT
GOWN STRL REUS W/TWL LRG LVL3 (GOWN DISPOSABLE) ×8
LABEL OR SOLS (LABEL) ×3 IMPLANT
LIGASURE LAP MARYLAND 5MM 37CM (ELECTROSURGICAL) ×3 IMPLANT
NS IRRIG 1000ML POUR BTL (IV SOLUTION) ×3 IMPLANT
PACK BASIN MAJOR ARMC (MISCELLANEOUS) ×3 IMPLANT
PACK COLON CLEAN CLOSURE (MISCELLANEOUS) ×3 IMPLANT
RETAINER VISCERA MED (MISCELLANEOUS) ×3 IMPLANT
SEPRAFILM MEMBRANE 5X6 (MISCELLANEOUS) ×3 IMPLANT
SOL PREP PVP 2OZ (MISCELLANEOUS) ×3
SOLUTION PREP PVP 2OZ (MISCELLANEOUS) ×1 IMPLANT
SPONGE LAP 18X18 5 PK (GAUZE/BANDAGES/DRESSINGS) ×3 IMPLANT
STAPLER GUN LINEAR PROX 60 (STAPLE) ×3 IMPLANT
STAPLER PROXIMATE 75MM BLUE (STAPLE) ×3 IMPLANT
STAPLER SKIN PROX 35W (STAPLE) ×3 IMPLANT
SUT NYLON 2-0 (SUTURE) ×3 IMPLANT
SUT PDS AB 1 TP1 96 (SUTURE) ×6 IMPLANT
SUT PROLENE 1 CT (SUTURE) ×12 IMPLANT
SUT SILK 3-0 (SUTURE) ×6 IMPLANT
SUT VIC AB 1 CTX 27 (SUTURE) ×12 IMPLANT
SUT VIC AB 3-0 SH 27 (SUTURE) ×4
SUT VIC AB 3-0 SH 27X BRD (SUTURE) ×2 IMPLANT
SUT VICRYL PLUS ABS 0 54 (SUTURE) ×3 IMPLANT
SYR BULB IRRIG 60ML STRL (SYRINGE) ×3 IMPLANT
TRAY FOLEY W/METER SILVER 16FR (SET/KITS/TRAYS/PACK) ×3 IMPLANT

## 2017-10-20 NOTE — Transfer of Care (Signed)
Immediate Anesthesia Transfer of Care Note  Patient: Miguel Flores  Procedure(s) Performed: COLOSTOMY CLOSURE (N/A )  Patient Location: PACU  Anesthesia Type:General  Level of Consciousness: sedated  Airway & Oxygen Therapy: Patient Spontanous Breathing and Patient connected to face mask oxygen  Post-op Assessment: Report given to RN and Post -op Vital signs reviewed and stable  Post vital signs: Reviewed and stable  Last Vitals:  Vitals:   10/20/17 0924 10/20/17 1246  BP: (!) 153/81 (!) 165/68  Pulse: 74 63  Resp: 16 15  Temp: (!) 36 C   SpO2: 98% 98%    Last Pain:  Vitals:   10/20/17 0924  TempSrc: Tympanic         Complications: No apparent anesthesia complications

## 2017-10-20 NOTE — Op Note (Signed)
10/20/2017 Miguel Flores  Pre-operative Diagnosis: Colon cancer  Post-operative Diagnosis: Colon cancer  Procedure: Colostomy closure adhesio lysis  Surgeon: Jerrol Banana. Burt Knack, MD FACS  Anesthesia: Gen. with endotracheal tube  Assistant: Marta Lamas  Procedure Details  The patient was seen again in the Holding Room. The benefits, complications, treatment options, and expected outcomes were discussed with the patient. The risks of bleeding, infection, recurrence of symptoms, failure to resolve symptoms,  bowel injury, any of which could require further surgery were reviewed with the patient.   The patient was taken to Operating Room, identified as Miguel Flores and the procedure verified.  A Time Out was held and the above information confirmed.  Prior to the induction of general anesthesia, antibiotic prophylaxis was administered. VTE prophylaxis was in place. General endotracheal anesthesia was then administered and tolerated well. After the induction, the abdomen was prepped with Chloraprep and draped in the sterile fashion. The patient was positioned in the well padded low lithotomy position.  A Foley catheter had been placed by nursing.  The colostomy had been sutured closed with silk.  A midline incision was utilized open and explore the abdominal cavity.  Extensive adhesions of small bowel to the anterior abdominal wall were taken down sharply without the use of energy.  Occasionally a serosal tear was identified which was closed with 3-0 silk Lembert type sutures without any sign of a full-thickness enterotomy.  Once this was complete the small bowel was run multiple times to ensure that there was no missed serosal or full-thickness tear.  There were none.  The colostomy was taken down by cutting the skin in a lenticular shaped fashion and then bringing the colostomy into the abdominal cavity.  The abdominal wall rent from the colostomy site was closed with internal  figure-of-eight 0 or #1 Prolenes.  A second layer of 0 #1 Prolenes were in the anterior portion of the abdominal wall as well.    The distal rectal stump was identified where the Prolene sutures were present.  It was quite long and somewhat redundant without signs of diverticulosis.  This allowed for consideration of utilizing a GIA stapler as the anastomosis.  This was performed as follows.  The colostomy was cut off utilizing cautery.  There was good blood supply to the end of the colostomy.  An opening in the distal rectal stump was performed and the GIA stapler was placed into each lumina.  The GIA stapler was fired.  Then inspection intraluminally was performed and well visualized.  There was no bleeding.  The colon mucosa and rectal mucosa appeared to be quite viable.  A TA stapler was then utilized to close the ends of the newly formed anastomosis.  This was reinforced with running 3-0 Prolene's and a double run technique.  The distal portion of the anastomosis was reinforced with 3 oh silks at the end of the staple line.  At this point hemostasis was found to be adequate the pelvis was irrigated with copious amounts of normal saline and the sponge lap needle count was confirmed as correct.  There were no other palpable lesions within the remainder of the colon or liver.  Therefore closure was affected by changing out gown and gloves and instruments.  A piece of Seprafilm was placed over the small bowel well away from the anastomosis.  Running #1 PDS was placed.  Marcaine was infiltrated into the skin is obtains tissues around both incisions.  The subtenons tissue was irrigated with normal  saline and then skin staples were placed to approximate skin over the top of Penrose drains at each incision.  Sterile dressings were placed.   Findings: Extensive adhesions of small bowel to anterior abdominal wall.  Some interloop adhesions as well.  All taken down sharply without the use of energy around the  bowel.  Long rectal stump allowing for side to side GIA stapled anastomosis.  See above   Estimated Blood Loss: 75 cc         Drains: 2 Penrose drains in the subcutaneous tissues from each incision         Specimens: Colostomy site       Complications: None              Condition: stable   Miguel Flores E. Burt Knack, MD, FACS

## 2017-10-20 NOTE — Progress Notes (Signed)
Attempted to place abdominal binder on pt. Pt was unable to tolerate and refused.

## 2017-10-20 NOTE — Anesthesia Procedure Notes (Signed)
Procedure Name: Intubation Performed by: Lance Muss, CRNA Pre-anesthesia Checklist: Patient identified, Patient being monitored, Timeout performed, Emergency Drugs available and Suction available Patient Re-evaluated:Patient Re-evaluated prior to induction Oxygen Delivery Method: Circle system utilized Preoxygenation: Pre-oxygenation with 100% oxygen Induction Type: IV induction Ventilation: Mask ventilation without difficulty Laryngoscope Size: Mac and 3 Grade View: Grade II Tube type: Oral Tube size: 7.5 mm Number of attempts: 1 Airway Equipment and Method: Stylet and Bougie stylet Placement Confirmation: ETT inserted through vocal cords under direct vision,  positive ETCO2 and breath sounds checked- equal and bilateral Secured at: 22 cm Tube secured with: Tape Dental Injury: Teeth and Oropharynx as per pre-operative assessment  Difficulty Due To: Difficult Airway- due to anterior larynx Future Recommendations: Recommend- induction with short-acting agent, and alternative techniques readily available

## 2017-10-20 NOTE — Anesthesia Preprocedure Evaluation (Signed)
Anesthesia Evaluation  Patient identified by MRN, date of birth, ID band Patient awake    Reviewed: Allergy & Precautions, NPO status , Patient's Chart, lab work & pertinent test results, reviewed documented beta blocker date and time   History of Anesthesia Complications Negative for: history of anesthetic complications  Airway Mallampati: III  TM Distance: >3 FB     Dental  (+) Chipped, Dental Advidsory Given   Pulmonary neg pulmonary ROS,           Cardiovascular Exercise Tolerance: Good hypertension, Pt. on medications (-) angina(-) CAD, (-) Past MI, (-) Cardiac Stents and (-) CABG (-) dysrhythmias (-) Valvular Problems/Murmurs     Neuro/Psych negative neurological ROS  negative psych ROS   GI/Hepatic negative GI ROS, Neg liver ROS, Colon CA   Endo/Other  diabetes, Type 2  Renal/GU Renal InsufficiencyRenal disease     Musculoskeletal   Abdominal   Peds negative pediatric ROS (+)  Hematology  (+) Blood dyscrasia, anemia ,   Anesthesia Other Findings Past Medical History: No date: Cancer ([place])     Comment:  skin cancer-nose w graft,also shoulder- basal cell per               pt No date: Cataract     Comment:  r eye No date: Chronic kidney disease     Comment:  kidney stones 20 y ago per pt No date: Colon cancer ([place]) No date: Diabetes mellitus without complication ([facility]) No date: Hyperlipidemia No date: Hypertension  Reproductive/Obstetrics negative OB ROS                             Anesthesia Physical  Anesthesia Plan  ASA: III  Anesthesia Plan: General   Post-op Pain Management:    Induction: Intravenous  PONV Risk Score and Plan: 2 and Ondansetron and Dexamethasone  Airway Management Planned: Oral ETT  Additional Equipment:   Intra-op Plan:   Post-operative Plan: Extubation in OR  Informed Consent: I have reviewed the patients History and Physical, chart,  labs and discussed the procedure including the risks, benefits and alternatives for the proposed anesthesia with the patient or authorized representative who has indicated his/her understanding and acceptance.     Plan Discussed with: CRNA  Anesthesia Plan Comments:         Anesthesia Quick Evaluation

## 2017-10-20 NOTE — Progress Notes (Signed)
Preoperative Review   Patient is met in the preoperative holding area. The history is reviewed in the chart and with the patient. I personally reviewed the options and rationale as well as the risks of this procedure that have been previously discussed with the patient.  I especially reviewed the risk of a temporary ileostomy or anastomotic leak etc. I answered questions for his son especially.  All questions asked by the patient and/or family were answered to their satisfaction.  Patient agrees to proceed with this procedure at this time.  [name] M.D. FACS

## 2017-10-20 NOTE — Anesthesia Post-op Follow-up Note (Signed)
Anesthesia QCDR form completed.        

## 2017-10-20 NOTE — Consult Note (Signed)
[facility] [place] NAME: Miguel Flores    MR#:  [id number]  DATE OF BIRTH:  04/19/1941  DATE OF ADMISSION:  10/20/2017  PRIMARY CARE PHYSICIAN: [name], MD   REQUESTING/REFERRING PHYSICIAN: Dr Burt Knack  CHIEF COMPLAINT:  Patient is postop colostomy revision.  Internal medicine consulted for medical management  HISTORY OF PRESENT ILLNESS:  Miguel Flores  is a 76 y.o. male with a known history of chronic kidney disease, diabetes, history of colon cancer status post colostomy now admitted electively for colostomy reversal. Internal medicine was consulted for medical management. Patient just got back from surgery.  He denies any complaints other than pain at the surgical site. Per RN surgery went well. PAST MEDICAL HISTORY:   Past Medical History:  Diagnosis Date  . Cancer ([place])    skin cancer-nose w graft,also shoulder- basal cell per pt  . Cataract    r eye  . Chronic kidney disease    kidney stones 20 y ago per pt  . Colon cancer ([place])   . Diabetes mellitus without complication ([place])   . Eczema   . History of kidney stones   . Hyperlipidemia   . Hypertension     PAST SURGICAL HISTOIRY:   Past Surgical History:  Procedure Laterality Date  . COLECTOMY WITH COLOSTOMY CREATION/HARTMANN PROCEDURE N/A 02/06/2017   Procedure: COLECTOMY WITH COLOSTOMY CREATION/HARTMANN PROCEDURE;  Surgeon: [name], MD;  Location: [facility] ORS;  Service: General;  Laterality: N/A;  . COLONOSCOPY WITH PROPOFOL N/A 09/04/2017   Procedure: COLONOSCOPY WITH PROPOFOL;  Surgeon: Jonathon Bellows, MD;  Location: [facility] ENDOSCOPY;  Service: Gastroenterology;  Laterality: N/A;  . CYSTOSCOPY    . FLEXIBLE SIGMOIDOSCOPY N/A 02/06/2017   Procedure: FLEXIBLE SIGMOIDOSCOPY;  Surgeon: Christene Lye, MD;  Location: [facility] ENDOSCOPY;  Service: Endoscopy;  Laterality: N/A;    SOCIAL HISTORY:   Social History   Tobacco Use  . Smoking status:  Never Smoker  . Smokeless tobacco: Never Used  Substance Use Topics  . Alcohol use: No    FAMILY HISTORY:   Family History  Problem Relation Age of Onset  . Diabetes Mother   . AAA (abdominal aortic aneurysm) Mother   . Coronary artery disease Mother   . Diabetes Father   . Coronary artery disease Father   . Dementia Father   . Breast cancer Sister     DRUG ALLERGIES:  No Known Allergies  REVIEW OF SYSTEMS:   Review of Systems  Constitutional: Negative for chills, fever and weight loss.  HENT: Negative for ear discharge, ear pain and nosebleeds.   Eyes: Negative for blurred vision, pain and discharge.  Respiratory: Negative for sputum production, shortness of breath, wheezing and stridor.   Cardiovascular: Negative for chest pain, palpitations, orthopnea and PND.  Gastrointestinal: Positive for abdominal pain. Negative for diarrhea, nausea and vomiting.  Genitourinary: Negative for frequency and urgency.  Musculoskeletal: Negative for back pain and joint pain.  Neurological: Positive for weakness. Negative for sensory change, speech change and focal weakness.  Psychiatric/Behavioral: Negative for depression and hallucinations. The patient is not nervous/anxious.    MEDICATIONS AT HOME:   Prior to Admission medications   Medication Sig Start Date End Date Taking? Authorizing Provider  Apremilast (OTEZLA) 30 MG TABS Take 30 mg by mouth daily.    Yes [provider]  aspirin EC 81 MG tablet Take 81 mg by mouth daily.   Yes [provider]  ferrous sulfate 324 (  65 Fe) MG TBEC Take 324 mg by mouth daily. 02/03/17  Yes [provider]  glimepiride (AMARYL) 2 MG tablet Take 2 mg by mouth daily. 08/14/15  Yes [provider]  loratadine (CLARITIN) 10 MG tablet Take 10 mg by mouth daily.   Yes [provider]  losartan (COZAAR) 25 MG tablet Take 25 mg by mouth daily. 03/27/16  Yes [provider]  metFORMIN (GLUCOPHAGE) 1000 MG  tablet Take 1,000-1,500 tablets by mouth See admin instructions. Take 1000 mg by mouth in the morning and take 1500 mg by mouth at supper 08/07/16  Yes [provider]  naproxen sodium (ALEVE) 220 MG tablet Take 220 mg by mouth 2 (two) times daily as needed (for pain or headache).    Yes [provider]  pravastatin (PRAVACHOL) 20 MG tablet Take 20 mg by mouth daily. 12/27/15  Yes [provider]  triamcinolone (KENALOG) 0.025 % cream Apply 1 application topically daily.   Yes [provider]      VITAL SIGNS:  Blood pressure 133/65, pulse 69, temperature 97.9 F (36.6 C), temperature source Oral, resp. rate 16, height 5\' 6"  (1.676 m), weight 86.6 kg (191 lb), SpO2 97 %.  PHYSICAL EXAMINATION:  GENERAL:  76 y.o.-year-old patient lying in the bed with no acute distress.  EYES: Pupils equal, round, reactive to light and accommodation. No scleral icterus. Extraocular muscles intact.  HEENT: Head atraumatic, normocephalic. Oropharynx and nasopharynx clear.  NECK:  Supple, no jugular venous distention. No thyroid enlargement, no tenderness.  LUNGS: Normal breath sounds bilaterally, no wheezing, rales,rhonchi or crepitation. No use of accessory muscles of respiration.  CARDIOVASCULAR: S1, S2 normal. No murmurs, rubs, or gallops.  ABDOMEN: Soft, nontender, nondistended. Bowel sounds present. No organomegaly or mass.  Surgical dressing present left lower abdomen EXTREMITIES: No pedal edema, cyanosis, or clubbing.  NEUROLOGIC: Cranial nerves II through XII are intact. Muscle strength 5/5 in all extremities. Sensation intact. Gait not checked.  PSYCHIATRIC: The patient is alert and oriented x 3.  SKIN: No obvious rash, lesion, or ulcer.   LABORATORY PANEL:   CBC Recent Labs  Lab 10/20/17 1423  WBC 16.3*  HGB 15.2  HCT 45.8  PLT 285    ------------------------------------------------------------------------------------------------------------------  Chemistries  Recent Labs  Lab 10/20/17 1423  CREATININE 1.06   ------------------------------------------------------------------------------------------------------------------  Cardiac Enzymes No results for input(s): TROPONINI in the last 168 hours. ------------------------------------------------------------------------------------------------------------------  RADIOLOGY:  No results found.  EKG:   Orders placed or performed during the hospital encounter of 10/12/17  . EKG 12 lead  . EKG 12 lead    IMPRESSION AND PLAN:   Miguel Flores  is a 76 y.o. male with a known history of chronic kidney disease, diabetes, history of colon cancer status post colostomy now admitted electively for colostomy reversal. Internal medicine was consulted for medical management.  1 hypertension Blood pressure stable.  Patient started back on his losartan  2.  Type 2 diabetes Resume glimepiride and metformin.  Sliding scale insulin.  3.  Hyperlipidemia on pravastatin  4.  History of colon cancer status post colostomy now is status post colostomy revision Patient is status post chemotherapy.  5.  DVT prophylaxis subcut heparin  Discussed with patient and family  All the records are reviewed and case discussed with Consulting provider. Management plans discussed with the patient, family and they are in agreement.  CODE STATUS: full  TOTAL TIME TAKING CARE OF THIS PATIENT: *45* minutes.    Fritzi Mandes M.D on  10/20/2017 at 3:32 PM  Between 7am to 6pm - Pager - [phone]  After 6pm go to [email or web address] - password [email or web address] [facility]  Office  [phone]  CC: Primary care Physician: [name], MD

## 2017-10-21 ENCOUNTER — Encounter: Payer: Self-pay | Admitting: Surgery

## 2017-10-21 LAB — CBC
HEMATOCRIT: 43.3 % (ref 40.0–52.0)
HEMOGLOBIN: 14.5 g/dL (ref 13.0–18.0)
MCH: 32.8 pg (ref 26.0–34.0)
MCHC: 33.5 g/dL (ref 32.0–36.0)
MCV: 98.1 fL (ref 80.0–100.0)
Platelets: 259 10*3/uL (ref 150–440)
RBC: 4.41 MIL/uL (ref 4.40–5.90)
RDW: 14.6 % — ABNORMAL HIGH (ref 11.5–14.5)
WBC: 11.9 10*3/uL — ABNORMAL HIGH (ref 3.8–10.6)

## 2017-10-21 LAB — SURGICAL PATHOLOGY

## 2017-10-21 LAB — BASIC METABOLIC PANEL
ANION GAP: 9 (ref 5–15)
BUN: 13 mg/dL (ref 6–20)
CO2: 21 mmol/L — AB (ref 22–32)
Calcium: 8.5 mg/dL — ABNORMAL LOW (ref 8.9–10.3)
Chloride: 105 mmol/L (ref 101–111)
Creatinine, Ser: 0.98 mg/dL (ref 0.61–1.24)
GFR calc Af Amer: >60 mL/min (ref >60)
GLUCOSE: 240 mg/dL — AB (ref 65–99)
POTASSIUM: 4 mmol/L (ref 3.5–5.1)
Sodium: 135 mmol/L (ref 135–145)

## 2017-10-21 LAB — GLUCOSE, CAPILLARY
GLUCOSE-CAPILLARY: 206 mg/dL — AB (ref 65–99)
GLUCOSE-CAPILLARY: 158 mg/dL — AB (ref 65–99)
GLUCOSE-CAPILLARY: 175 mg/dL — AB (ref 65–99)
Glucose-Capillary: 211 mg/dL — ABNORMAL HIGH (ref 65–99)

## 2017-10-21 LAB — HEMOGLOBIN A1C
HEMOGLOBIN A1C: 7.1 % — AB (ref 4.8–5.6)
Mean Plasma Glucose: 157.07 mg/dL

## 2017-10-21 MED ORDER — INSULIN ASPART 100 UNIT/ML ~~LOC~~ SOLN
0.0000 [IU] | Freq: Three times a day (TID) | SUBCUTANEOUS | Status: DC
  Administered 2017-10-21: 3 [IU] via SUBCUTANEOUS
  Administered 2017-10-22: 5 [IU] via SUBCUTANEOUS
  Administered 2017-10-22 – 2017-10-23 (×3): 3 [IU] via SUBCUTANEOUS
  Administered 2017-10-23: 5 [IU] via SUBCUTANEOUS
  Administered 2017-10-23: 3 [IU] via SUBCUTANEOUS
  Administered 2017-10-24: 8 [IU] via SUBCUTANEOUS
  Filled 2017-10-21 (×9): qty 1

## 2017-10-21 MED ORDER — INSULIN GLARGINE 100 UNIT/ML ~~LOC~~ SOLN
8.0000 [IU] | Freq: Every day | SUBCUTANEOUS | Status: DC
  Administered 2017-10-21 – 2017-10-24 (×4): 8 [IU] via SUBCUTANEOUS
  Filled 2017-10-21 (×4): qty 0.08

## 2017-10-21 MED ORDER — INSULIN ASPART 100 UNIT/ML ~~LOC~~ SOLN: 0.0000 [IU] | Freq: Every day | SUBCUTANEOUS | Status: DC

## 2017-10-21 NOTE — Progress Notes (Signed)
1 Day Post-Op  Subjective: Postop day 1 following colostomy closure.  Patient's chief complaint is that of abdominal pain and the inability to flex his left hip.  He has no problems with the right hip he has no nausea vomiting no fevers or chills  Objective: Vital signs in last 24 hours: Temp:  [96.8 F (36 C)-98.7 F (37.1 C)] 98.2 F (36.8 C) (12/12 0423) Pulse Rate:  [61-76] 76 (12/12 0423) Resp:  [13-20] 18 (12/12 0423) BP: (127-169)/(60-81) 127/62 (12/12 0423) SpO2:  [90 %-98 %] 96 % (12/12 0423) Weight:  [191 lb (86.6 kg)] 191 lb (86.6 kg) (12/11 0924) Last BM Date: 10/19/17  Intake/Output from previous day: 12/11 0701 - 12/12 0700 In: 1924.1 [I.V.:1924.1] Out: 1200 [Urine:1175; Blood:25] Intake/Output this shift: No intake/output data recorded.  Physical exam:  Vital signs are stable awake alert and oriented abdomen is soft nondistended minimally tender no peritoneal signs passive movement of the left leg without difficulty nontender calves.  Moves right leg spontaneously.  Lab Results: CBC  Recent Labs    10/20/17 1423 10/21/17 0411  WBC 16.3* 11.9*  HGB 15.2 14.5  HCT 45.8 43.3  PLT 285 259   BMET Recent Labs    10/20/17 1423 10/21/17 0411  NA  --  135  K  --  4.0  CL  --  105  CO2  --  21*  GLUCOSE  --  240*  BUN  --  13  CREATININE 1.06 0.98  CALCIUM  --  8.5*   PT/INR No results for input(s): LABPROT, INR in the last 72 hours. ABG No results for input(s): PHART, HCO3 in the last 72 hours.  Invalid input(s): PCO2, PO2  Studies/Results: No results found.  Anti-infectives: Anti-infectives (From admission, onward)   Start     Dose/Rate Route Frequency Ordered Stop   10/20/17 0600  ertapenem (INVANZ) 1 g in sodium chloride 0.9 % 50 mL IVPB     1 g 100 mL/hr over 30 Minutes Intravenous On call to O.R. 10/19/17 2225 10/20/17 1119      Assessment/Plan: s/p Procedure(s): COLOSTOMY CLOSURE   Patient is doing very well at this point.  I  believe his inability or unwillingness to flex his left hip is secondary to the abdominal tenderness that he is experiencing and he agrees that that is the case.  I believe is secondary to his rectus sheath incision and closure from the colostomy site.  Currently he is doing very well and on clear liquid diet will await bowel function at this point ambulate today and discontinue Foley catheter  [name], MD, FACS  10/21/2017

## 2017-10-21 NOTE — Plan of Care (Signed)
Patient is progressing but does not want to move due to the pain.  Patient stated that he will ambulate in the morning. Christene Slates

## 2017-10-21 NOTE — Progress Notes (Signed)
When helping pt back into the bed. Pt laid back in bed and hit his head on bed rail. Pt had zero complaints of head pain. Called and reported to Dr. Burt Knack. No new orders at this time.

## 2017-10-21 NOTE — Progress Notes (Signed)
Inpatient Diabetes Program Recommendations  AACE/ADA: New Consensus Statement on Inpatient Glycemic Control (2015)  Target Ranges:  Prepandial:   less than 140 mg/dL      Peak postprandial:   less than 180 mg/dL (1-2 hours)      Critically ill patients:  140 - 180 mg/dL   Results for Miguel Flores, Miguel Flores (MRN [id number]) as of 10/21/2017 08:52  Ref. Range 10/20/2017 09:27 10/20/2017 12:54 10/20/2017 17:08 10/20/2017 21:22  Glucose-Capillary Latest Ref Range: 65 - 99 mg/dL 196 (H) 220 (H) 209 (H) 233 (H)   Results for Miguel Flores, Miguel Flores (MRN [id number]) as of 10/21/2017 08:52  Ref. Range 10/21/2017 07:24  Glucose-Capillary Latest Ref Range: 65 - 99 mg/dL 211 (H)   Admit with: Colostomy Closure  History: DM, CKD  Home DM Meds: Amaryl 2 mg daily       Metformin 1000 mg AM/ 1500 mg PM  Current Insulin Orders: Novolog Sensitive Correction Scale/ SSI (0-9 units) TID AC      Amaryl 2 mg daily                 Metformin 1000 mg BID       MD- Please consider the following in-hospital insulin adjustments:  1. Stop Metformin and Amaryl for now until patient taking solid PO diet  2. Start low dose basal insulin: Lantus 8 units daily (0.1 units/kg dosing)  3. Increase Novolog SSI to Moderate scale (0-15 units) TID AC + HS     --Will follow patient during hospitalization--  Wyn Quaker RN, MSN, CDE Diabetes Coordinator Inpatient Glycemic Control Team Team Pager: [phone] (8a-5p)

## 2017-10-21 NOTE — Progress Notes (Signed)
[facility] [place] NAME: Miguel Flores    MR#:  [id number]  DATE OF BIRTH:  05-02-1941  SUBJECTIVE:  CHIEF COMPLAINT:  No chief complaint on file.  -Admitted for colostomy reversal. Postoperative day 1 today. On a liquid diet. Complains of significant abdominal pain - also has left hip pain  REVIEW OF SYSTEMS:  Review of Systems  Constitutional: Negative for chills and fever.  HENT: Negative for congestion, ear discharge and hearing loss.   Eyes: Positive for blurred vision.  Respiratory: Negative for cough, shortness of breath and wheezing.   Cardiovascular: Negative for chest pain and palpitations.  Gastrointestinal: Positive for abdominal pain, constipation and nausea. Negative for diarrhea and vomiting.  Genitourinary: Negative for dysuria.  Musculoskeletal: Positive for myalgias.  Neurological: Negative for dizziness, sensory change, speech change, focal weakness, seizures and headaches.  Psychiatric/Behavioral: Negative for depression.    DRUG ALLERGIES:  No Known Allergies  VITALS:  Blood pressure (!) 141/59, pulse 76, temperature 98.1 F (36.7 C), resp. rate 18, height 5\' 6"  (1.676 m), weight 86.6 kg (191 lb), SpO2 96 %.  PHYSICAL EXAMINATION:  Physical Exam  GENERAL:  76 y.o.-year-old patient lying in the bed with no acute distress.  EYES: Pupils equal, round, reactive to light and accommodation. No scleral icterus. Extraocular muscles intact.  HEENT: Head atraumatic, normocephalic. Oropharynx and nasopharynx clear.  NECK:  Supple, no jugular venous distention. No thyroid enlargement, no tenderness.  LUNGS: Normal breath sounds bilaterally, no wheezing, rales,rhonchi or crepitation. No use of accessory muscles of respiration.  CARDIOVASCULAR: S1, S2 normal. No murmurs, rubs, or gallops.  ABDOMEN: Soft, tender and firm in the left lower quadrant, distended abdomen. No guarding or rigidity. hypoactive Bowel sounds  present. No organomegaly or mass.  EXTREMITIES: No pedal edema, cyanosis, or clubbing.  NEUROLOGIC: Cranial nerves II through XII are intact. Muscle strength 5/5 in all extremities except significant pain on flexing left hip. Sensation intact. Gait not checked.  PSYCHIATRIC: The patient is alert and oriented x 3.  SKIN: No obvious rash, lesion, or ulcer.    LABORATORY PANEL:   CBC Recent Labs  Lab 10/21/17 0411  WBC 11.9*  HGB 14.5  HCT 43.3  PLT 259   ------------------------------------------------------------------------------------------------------------------  Chemistries  Recent Labs  Lab 10/21/17 0411  NA 135  K 4.0  CL 105  CO2 21*  GLUCOSE 240*  BUN 13  CREATININE 0.98  CALCIUM 8.5*   ------------------------------------------------------------------------------------------------------------------  Cardiac Enzymes No results for input(s): TROPONINI in the last 168 hours. ------------------------------------------------------------------------------------------------------------------  RADIOLOGY:  No results found.  EKG:   Orders placed or performed during the hospital encounter of 10/12/17  . EKG 12 lead  . EKG 12 lead    ASSESSMENT AND PLAN:   Miguel Flores  is a 76 y.o. male with a known history of chronic kidney disease, diabetes, history of colon cancer status post colostomy now admitted electively for colostomy reversal. Internal medicine was consulted for medical management.  1 hypertension- Blood pressure stable. continue his losartan  2.  Type 2 diabetes- currently only on clear liquid diet. Metformin and glimepiride health as per diabetes coordinator's recommendations. -On low-dose Lantus and sliding scale insulin. -Restart home medications at discharge.  3.  Hyperlipidemia on pravastatin  4.  History of colon cancer status post colostomy now is status post colostomy revision Patient is status post chemotherapy. -Management per  surgery. Postop day 1 today. On clear liquid diet.  5. Left hip pain-likely  musculoskeletal pain from rectus sheath hematoma from the surgery. Monitor carefully. Weightbearing as tolerated. -Physical therapy if needed  6. DVT prophylaxis-on subcutaneous heparin      All the records are reviewed and case discussed with Care Management/Social Workerr. Management plans discussed with the patient, family and they are in agreement.  CODE STATUS: Full code  TOTAL TIME TAKING CARE OF THIS PATIENT: 38 minutes.   POSSIBLE D/C IN 1-2 DAYS, DEPENDING ON CLINICAL CONDITION.   Gladstone Lighter M.D on 10/21/2017 at 3:39 PM  Between 7am to 6pm - Pager - [phone]  After 6pm go to [email or web address] - password [email or web address] [facility]  Office  [phone]  CC: Primary care physician; [name], MD

## 2017-10-22 LAB — BASIC METABOLIC PANEL
ANION GAP: 8 (ref 5–15)
BUN: 13 mg/dL (ref 6–20)
CO2: 22 mmol/L (ref 22–32)
Calcium: 8.7 mg/dL — ABNORMAL LOW (ref 8.9–10.3)
Chloride: 106 mmol/L (ref 101–111)
Creatinine, Ser: 0.86 mg/dL (ref 0.61–1.24)
GLUCOSE: 172 mg/dL — AB (ref 65–99)
POTASSIUM: 3.7 mmol/L (ref 3.5–5.1)
Sodium: 136 mmol/L (ref 135–145)

## 2017-10-22 LAB — CBC WITH DIFFERENTIAL/PLATELET
BASOS ABS: 0 10*3/uL (ref 0–0.1)
BASOS PCT: 0 %
EOS PCT: 1 %
Eosinophils Absolute: 0.2 10*3/uL (ref 0–0.7)
HCT: 40.7 % (ref 40.0–52.0)
Hemoglobin: 13.7 g/dL (ref 13.0–18.0)
LYMPHS PCT: 16 %
Lymphs Abs: 1.8 10*3/uL (ref 1.0–3.6)
MCH: 33.2 pg (ref 26.0–34.0)
MCHC: 33.8 g/dL (ref 32.0–36.0)
MCV: 98.3 fL (ref 80.0–100.0)
Monocytes Absolute: 1.1 10*3/uL — ABNORMAL HIGH (ref 0.2–1.0)
Monocytes Relative: 9 %
NEUTROS ABS: 8.5 10*3/uL — AB (ref 1.4–6.5)
Neutrophils Relative %: 74 %
PLATELETS: 242 10*3/uL (ref 150–440)
RBC: 4.14 MIL/uL — AB (ref 4.40–5.90)
RDW: 14 % (ref 11.5–14.5)
WBC: 11.6 10*3/uL — ABNORMAL HIGH (ref 3.8–10.6)

## 2017-10-22 LAB — GLUCOSE, CAPILLARY
GLUCOSE-CAPILLARY: 153 mg/dL — AB (ref 65–99)
Glucose-Capillary: 176 mg/dL — ABNORMAL HIGH (ref 65–99)
Glucose-Capillary: 175 mg/dL — ABNORMAL HIGH (ref 65–99)
Glucose-Capillary: 215 mg/dL — ABNORMAL HIGH (ref 65–99)

## 2017-10-22 NOTE — Anesthesia Postprocedure Evaluation (Signed)
Anesthesia Post Note  Patient: Miguel Flores  Procedure(s) Performed: COLOSTOMY CLOSURE (N/A )  Patient location during evaluation: PACU Anesthesia Type: General Level of consciousness: awake and alert Pain management: pain level controlled Vital Signs Assessment: post-procedure vital signs reviewed and stable Respiratory status: spontaneous breathing, nonlabored ventilation, respiratory function stable and patient connected to nasal cannula oxygen Cardiovascular status: blood pressure returned to baseline and stable Postop Assessment: no apparent nausea or vomiting Anesthetic complications: no     Last Vitals:  Vitals:   10/22/17 0533 10/22/17 1222  BP: 138/67 (!) 125/58  Pulse: 82 68  Resp: 18 17  Temp: (!) 36.4 C 36.9 C  SpO2: 93% 95%    Last Pain:  Vitals:   10/22/17 1417  TempSrc:   PainSc: 0-No pain                 Martha Clan

## 2017-10-22 NOTE — Progress Notes (Signed)
[facility] [place] NAME: Miguel Flores    MR#:  [id number]  DATE OF BIRTH:  [birth year]  SUBJECTIVE:  CHIEF COMPLAINT:  No chief complaint on file.  -Admitted for colostomy reversal. Postoperative day 2 today. On a liquid diet.  -feels better -ambulated this am and has passed gas  REVIEW OF SYSTEMS:  Review of Systems  Constitutional: Negative for chills and fever.  HENT: Negative for congestion, ear discharge and hearing loss.   Eyes: Positive for blurred vision.  Respiratory: Negative for cough, shortness of breath and wheezing.   Cardiovascular: Negative for chest pain and palpitations.  Gastrointestinal: Positive for abdominal pain, constipation and nausea. Negative for diarrhea and vomiting.  Genitourinary: Negative for dysuria.  Musculoskeletal: Positive for myalgias.  Neurological: Negative for dizziness, sensory change, speech change, focal weakness, seizures and headaches.  Psychiatric/Behavioral: Negative for depression.    DRUG ALLERGIES:  No Known Allergies  VITALS:  Blood pressure 138/67, pulse 82, temperature (!) 97.5 F (36.4 C), temperature source Oral, resp. rate 18, height 5\' 6"  (1.676 m), weight 86.6 kg (191 lb), SpO2 93 %.  PHYSICAL EXAMINATION:  Physical Exam  GENERAL:  76 y.o.-year-old patient lying in the bed with no acute distress.  EYES: Pupils equal, round, reactive to light and accommodation. No scleral icterus. Extraocular muscles intact.  HEENT: Head atraumatic, normocephalic. Oropharynx and nasopharynx clear.  NECK:  Supple, no jugular venous distention. No thyroid enlargement, no tenderness.  LUNGS: Normal breath sounds bilaterally, no wheezing, rales,rhonchi or crepitation. No use of accessory muscles of respiration.  CARDIOVASCULAR: S1, S2 normal. No murmurs, rubs, or gallops.  ABDOMEN: Soft, tender and firm in the left lower quadrant, distended abdomen. No guarding or rigidity. hypoactive  Bowel sounds present. No organomegaly or mass.  EXTREMITIES: No pedal edema, cyanosis, or clubbing.  NEUROLOGIC: Cranial nerves II through XII are intact. Muscle strength 5/5 in all extremities except significant pain on flexing left hip. Sensation intact. Gait not checked.  PSYCHIATRIC: The patient is alert and oriented x 3.  SKIN: No obvious rash, lesion, or ulcer.    LABORATORY PANEL:   CBC Recent Labs  Lab 10/22/17 0402  WBC 11.6*  HGB 13.7  HCT 40.7  PLT 242   ------------------------------------------------------------------------------------------------------------------  Chemistries  Recent Labs  Lab 10/22/17 0402  NA 136  K 3.7  CL 106  CO2 22  GLUCOSE 172*  BUN 13  CREATININE 0.86  CALCIUM 8.7*   ------------------------------------------------------------------------------------------------------------------  Cardiac Enzymes No results for input(s): TROPONINI in the last 168 hours. ------------------------------------------------------------------------------------------------------------------  RADIOLOGY:  No results found.  EKG:   Orders placed or performed during the hospital encounter of 10/12/17  . EKG 12 lead  . EKG 12 lead    ASSESSMENT AND PLAN:   Miguel Flores  is a 76 y.o. male with a known history of chronic kidney disease, diabetes, history of colon cancer status post colostomy now admitted electively for colostomy reversal. Internal medicine was consulted for medical management.  1 hypertension Blood pressure stable. continue his losartan  2.  Type 2 diabetes- currently only on clear liquid diet. Holding Metformin and glimepiride health as per diabetes coordinator's recommendations. -On low-dose Lantus and sliding scale insulin. -Restart home medications at discharge.  3.  Hyperlipidemia on pravastatin  4.  History of colon cancer status post colostomy now is status post colostomy revision Patient is status post  chemotherapy. -Management per surgery. Postop day 2 today. On clear liquid diet. -passed gas  this am---advance diet per surgery when able to   5. Left hip pain-likely musculoskeletal pain from rectus sheath hematoma from the surgery. Monitor carefully. Weightbearing as tolerated. -Physical therapy if needed  6. DVT prophylaxis-on subcutaneous heparin  Will sign off. Call if needed. Thanks.   All the records are reviewed and case discussed with Care Management/Social Workerr. Management plans discussed with the patient, family and they are in agreement.  CODE STATUS: Full code  TOTAL TIME TAKING CARE OF THIS PATIENT: 20 minutes.   POSSIBLE D/C IN 1-2 DAYS, DEPENDING ON CLINICAL CONDITION.   Fritzi Mandes M.D on 10/22/2017 at 9:10 AM  Between 7am to 6pm - Pager - [phone]  After 6pm go to [email or web address] - password [email or web address] [facility]  Office  [phone]  CC: Primary care physician; [name], MD

## 2017-10-22 NOTE — Progress Notes (Signed)
2 Days Post-Op  Subjective: Status post colostomy closure.  Patient has no complaints and is feeling much better today than he did yesterday.  If no flatus yet but feels a lot of bowel peristalsis.  Objective: Vital signs in last 24 hours: Temp:  [97.5 F (36.4 C)-98.9 F (37.2 C)] 97.5 F (36.4 C) (12/13 0533) Pulse Rate:  [76-83] 82 (12/13 0533) Resp:  [18-20] 18 (12/13 0533) BP: (138-141)/(59-67) 138/67 (12/13 0533) SpO2:  [92 %-96 %] 93 % (12/13 0533) Last BM Date: 10/19/17  Intake/Output from previous day: 12/12 0701 - 12/13 0700 In: 3574.8 [P.O.:1220; I.V.:2354.8] Out: 1100 [Urine:1100] Intake/Output this shift: Total I/O In: 200 [I.V.:200] Out: -   Physical exam:  Vital signs stable and reviewed.  Wound is clean with Penrose is in place no erythema nontender abdomen nontender calves  Lab Results: CBC  Recent Labs    10/21/17 0411 10/22/17 0402  WBC 11.9* 11.6*  HGB 14.5 13.7  HCT 43.3 40.7  PLT 259 242   BMET Recent Labs    10/21/17 0411 10/22/17 0402  NA 135 136  K 4.0 3.7  CL 105 106  CO2 21* 22  GLUCOSE 240* 172*  BUN 13 13  CREATININE 0.98 0.86  CALCIUM 8.5* 8.7*   PT/INR No results for input(s): LABPROT, INR in the last 72 hours. ABG No results for input(s): PHART, HCO3 in the last 72 hours.  Invalid input(s): PCO2, PO2  Studies/Results: No results found.  Anti-infectives: Anti-infectives (From admission, onward)   Start     Dose/Rate Route Frequency Ordered Stop   10/20/17 0600  ertapenem (INVANZ) 1 g in sodium chloride 0.9 % 50 mL IVPB     1 g 100 mL/hr over 30 Minutes Intravenous On call to O.R. 10/19/17 2225 10/20/17 1119      Assessment/Plan: s/p Procedure(s): COLOSTOMY CLOSURE   We will continue with clear liquids for now until he passes gas.  Once that happens we will advance diet.[name], MD, FACS  10/22/2017 Status post colostomy

## 2017-10-23 LAB — GLUCOSE, CAPILLARY
GLUCOSE-CAPILLARY: 237 mg/dL — AB (ref 65–99)
Glucose-Capillary: 171 mg/dL — ABNORMAL HIGH (ref 65–99)
Glucose-Capillary: 164 mg/dL — ABNORMAL HIGH (ref 65–99)
Glucose-Capillary: 174 mg/dL — ABNORMAL HIGH (ref 65–99)

## 2017-10-23 NOTE — Progress Notes (Signed)
3 Days Post-Op  Subjective: Patient feels well following colostomy closure.  He is tolerating a diet and having minimal pain  Objective: Vital signs in last 24 hours: Temp:  [98.1 F (36.7 C)-98.5 F (36.9 C)] 98.1 F (36.7 C) (12/14 0529) Pulse Rate:  [68-74] 74 (12/14 0529) Resp:  [17-18] 18 (12/14 0529) BP: (125-131)/(58-69) 130/69 (12/14 0529) SpO2:  [93 %-95 %] 93 % (12/14 0529) Last BM Date: (pt had a colostomy)  Intake/Output from previous day: 12/13 0701 - 12/14 0700 In: 2080 [P.O.:1000; I.V.:1080] Out: 1925 [Urine:1925] Intake/Output this shift: No intake/output data recorded.  Physical exam:  Vital signs stable and reviewed abdomen is soft nontender nondistended nontympanitic Penrose drains in place without erythema or purulence  Lab Results: CBC  Recent Labs    10/21/17 0411 10/22/17 0402  WBC 11.9* 11.6*  HGB 14.5 13.7  HCT 43.3 40.7  PLT 259 242   BMET Recent Labs    10/21/17 0411 10/22/17 0402  NA 135 136  K 4.0 3.7  CL 105 106  CO2 21* 22  GLUCOSE 240* 172*  BUN 13 13  CREATININE 0.98 0.86  CALCIUM 8.5* 8.7*   PT/INR No results for input(s): LABPROT, INR in the last 72 hours. ABG No results for input(s): PHART, HCO3 in the last 72 hours.  Invalid input(s): PCO2, PO2  Studies/Results: No results found.  Anti-infectives: Anti-infectives (From admission, onward)   Start     Dose/Rate Route Frequency Ordered Stop   10/20/17 0600  ertapenem (INVANZ) 1 g in sodium chloride 0.9 % 50 mL IVPB     1 g 100 mL/hr over 30 Minutes Intravenous On call to O.R. 10/19/17 2225 10/20/17 1119      Assessment/Plan: s/p Procedure(s): COLOSTOMY CLOSURE   Plan to advance diet today probably home tomorrow morning.  [name], MD, FACS  10/23/2017

## 2017-10-23 NOTE — Care Management Important Message (Signed)
Important Message  Patient Details  Name: Miguel Flores MRN: [id number] Date of Birth: August 28, 1941   Medicare Important Message Given:  Yes    Beverly Sessions, RN 10/23/2017, 2:41 PM

## 2017-10-24 LAB — GLUCOSE, CAPILLARY
GLUCOSE-CAPILLARY: 190 mg/dL — AB (ref 65–99)
GLUCOSE-CAPILLARY: 271 mg/dL — AB (ref 65–99)

## 2017-10-24 MED ORDER — OXYCODONE-ACETAMINOPHEN 5-325 MG PO TABS: 1.0000 | ORAL_TABLET | ORAL | 0 refills | Status: DC | PRN

## 2017-10-24 NOTE — Progress Notes (Signed)
4 Days Post-Op  Subjective: Status post colostomy closure.  Patient feels well having no problems and tolerating a diet.  Objective: Vital signs in last 24 hours: Temp:  [97.5 F (36.4 C)-98.1 F (36.7 C)] 98.1 F (36.7 C) (12/15 0614) Pulse Rate:  [67-71] 71 (12/15 0614) Resp:  [18] 18 (12/15 0614) BP: (115-131)/(57-61) 131/57 (12/15 0614) SpO2:  [94 %] 94 % (12/15 0614) Last BM Date: (pt had a colostomy)  Intake/Output from previous day: 12/14 0701 - 12/15 0700 In: 1080 [P.O.:1080] Out: 475 [Urine:475] Intake/Output this shift: No intake/output data recorded.  Physical exam:  Wounds are clean no erythema no drainage staples are intact and Penrose drains are in place.  Lab Results: CBC  Recent Labs    10/22/17 0402  WBC 11.6*  HGB 13.7  HCT 40.7  PLT 242   BMET Recent Labs    10/22/17 0402  NA 136  K 3.7  CL 106  CO2 22  GLUCOSE 172*  BUN 13  CREATININE 0.86  CALCIUM 8.7*   PT/INR No results for input(s): LABPROT, INR in the last 72 hours. ABG No results for input(s): PHART, HCO3 in the last 72 hours.  Invalid input(s): PCO2, PO2  Studies/Results: No results found.  Anti-infectives: Anti-infectives (From admission, onward)   Start     Dose/Rate Route Frequency Ordered Stop   10/20/17 0600  ertapenem (INVANZ) 1 g in sodium chloride 0.9 % 50 mL IVPB     1 g 100 mL/hr over 30 Minutes Intravenous On call to O.R. 10/19/17 2225 10/20/17 1119      Assessment/Plan: s/p Procedure(s): COLOSTOMY CLOSURE   Patient doing very well following colostomy closure.  He will be discharged today on oral analgesics on a regular diet with instructions to shower.  He will change his dressing daily.  I will see him on Wednesday and remove his Penrose drains.  [name], MD, FACS  10/24/2017

## 2017-10-24 NOTE — Discharge Summary (Signed)
Physician Discharge Summary  Patient ID: Miguel Flores MRN: [id number] DOB/AGE: May 21, 1941 76 y.o.  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  Colostomy closure was performed with a side to side GIA stapled anastomosis.  He made an uncomplicated postoperative recovery.  He is discharged in stable condition tolerating a regular diet.  He has Penrose drains in place which will be removed on Wednesday in the office.  Consults: Internal medicine  Disposition: 01-Home or Self Care   Allergies as of 10/24/2017   No Known Allergies     Medication List    TAKE these medications   aspirin EC 81 MG tablet Take 81 mg by mouth daily.   ferrous sulfate 324 (65 Fe) MG Tbec Take 324 mg by mouth daily.   glimepiride 2 MG tablet Commonly known as:  AMARYL Take 2 mg by mouth daily.   loratadine 10 MG tablet Commonly known as:  CLARITIN Take 10 mg by mouth daily.   losartan 25 MG tablet Commonly known as:  COZAAR Take 25 mg by mouth daily.   metFORMIN 1000 MG tablet Commonly known as:  GLUCOPHAGE Take 1,000-1,500 tablets by mouth See admin instructions. Take 1000 mg by mouth in the morning and take 1500 mg by mouth at supper   naproxen sodium 220 MG tablet Commonly known as:  ALEVE Take 220 mg by mouth 2 (two) times daily as needed (for pain or headache).   OTEZLA 30 MG Tabs Generic drug:  Apremilast Take 30 mg by mouth daily.   oxyCODONE-acetaminophen 5-325 MG tablet Commonly known as:  PERCOCET/ROXICET Take 1-2 tablets by mouth every 4 (four) hours as needed for moderate pain or severe pain.   pravastatin 20 MG tablet Commonly known as:  PRAVACHOL Take 20 mg by mouth daily.   triamcinolone 0.025 % cream Commonly known as:  KENALOG Apply 1  application topically daily.      Follow-up Information    [name], MD Follow up in 5 day(s).   Specialty:  Surgery Contact information: [place] [address] [phone]           [name], MD, FACS

## 2017-10-24 NOTE — Discharge Instructions (Signed)
Remove dressing every 24 hours and replace May shower   Resume all home medications. Follow-up with Dr. Burt Knack in 5 days.

## 2017-10-28 ENCOUNTER — Ambulatory Visit (INDEPENDENT_AMBULATORY_CARE_PROVIDER_SITE_OTHER): Payer: Medicare Other | Admitting: Surgery

## 2017-10-28 ENCOUNTER — Encounter: Payer: Self-pay | Admitting: Surgery

## 2017-10-28 VITALS — BP 158/79 | HR 79 | Temp 97.8°F | Wt 184.0 lb

## 2017-10-28 DIAGNOSIS — C186 Malignant neoplasm of descending colon: Secondary | ICD-10-CM

## 2017-10-28 NOTE — Patient Instructions (Signed)
Please give us a call in case you have any questions or concerns.  

## 2017-10-28 NOTE — Progress Notes (Signed)
Outpatient postop visit  10/28/2017  Miguel Flores is an 76 y.o. male.    Procedure: colOstomy closure  JO:ACZY  HPI: Patient is here for close follow-up for removal of Penrose drains following colostomy closure.  He is tolerating a regular diet having regular bowel movements without fevers or chills  Medications reviewed.    Physical Exam:  There were no vitals taken for this visit.    PE: Minimal serous drainage no erythema no purulence Penrose drains were removed from the midline incision ostomy site incision calves are nontender    Assessment/Plan:  Follow-up after colostomy closure.  Penrose drains removed.  The patient doing very well recommend follow-up in 1 week for checkup.  [name], MD, FACS

## 2017-11-06 ENCOUNTER — Encounter: Payer: Medicare Other | Admitting: Surgery

## 2017-11-12 ENCOUNTER — Encounter: Payer: Self-pay | Admitting: Surgery

## 2017-11-12 ENCOUNTER — Ambulatory Visit (INDEPENDENT_AMBULATORY_CARE_PROVIDER_SITE_OTHER): Payer: Medicare Other | Admitting: Surgery

## 2017-11-12 VITALS — BP 173/78 | HR 78 | Temp 97.9°F | Ht 66.0 in | Wt 180.2 lb

## 2017-11-12 DIAGNOSIS — C186 Malignant neoplasm of descending colon: Secondary | ICD-10-CM

## 2017-11-12 NOTE — Progress Notes (Signed)
Outpatient postop visit  11/12/2017  Miguel Flores is an 77 y.o. male.    Procedure: colost closure  CC: No problems except newly diagnosed vertigo  HPI: Patient's recently been diagnosed with vertigo and is seeing his primary care physician.  He has no other problems is eating well no fevers or chills no abdominal pain  Medications reviewed.    Physical Exam:  BP (!) 173/78   Pulse 78   Temp 97.9 F (36.6 C) (Oral)   Ht 5\' 6"  (1.676 m)   Wt 180 lb 3.2 oz (81.7 kg)   BMI 29.09 kg/m     PE: Wounds are clean no erythema no drainage staples removed    Assessment/Plan:  Patient doing very well recommend follow-up on an as-needed basis  [name], MD, FACS

## 2017-11-12 NOTE — Patient Instructions (Signed)

## 2018-03-16 ENCOUNTER — Ambulatory Visit
Admission: RE | Admit: 2018-03-16 | Discharge: 2018-03-16 | Disposition: A | Discharge status: Home / Self Care | Payer: Medicare Other | Source: Ambulatory Visit | Attending: Oncology | Admitting: Oncology

## 2018-03-16 ENCOUNTER — Inpatient Hospital Stay: Payer: Medicare Other

## 2018-03-16 ENCOUNTER — Encounter: Payer: Self-pay | Admitting: Oncology

## 2018-03-16 ENCOUNTER — Inpatient Hospital Stay: Payer: Medicare Other | Attending: Oncology | Admitting: Oncology

## 2018-03-16 VITALS — BP 132/77 | HR 67 | Temp 97.3°F | Resp 18 | Ht 66.0 in | Wt 191.2 lb

## 2018-03-16 DIAGNOSIS — C189 Malignant neoplasm of colon, unspecified: Secondary | ICD-10-CM

## 2018-03-16 DIAGNOSIS — Z85038 Personal history of other malignant neoplasm of large intestine: Secondary | ICD-10-CM

## 2018-03-16 DIAGNOSIS — I129 Hypertensive chronic kidney disease with stage 1 through stage 4 chronic kidney disease, or unspecified chronic kidney disease: Secondary | ICD-10-CM | POA: Insufficient documentation

## 2018-03-16 DIAGNOSIS — I700 Atherosclerosis of aorta: Secondary | ICD-10-CM | POA: Diagnosis not present

## 2018-03-16 DIAGNOSIS — K8020 Calculus of gallbladder without cholecystitis without obstruction: Secondary | ICD-10-CM | POA: Diagnosis not present

## 2018-03-16 DIAGNOSIS — Z9221 Personal history of antineoplastic chemotherapy: Secondary | ICD-10-CM | POA: Diagnosis not present

## 2018-03-16 DIAGNOSIS — E1122 Type 2 diabetes mellitus with diabetic chronic kidney disease: Secondary | ICD-10-CM | POA: Diagnosis not present

## 2018-03-16 DIAGNOSIS — I2510 Atherosclerotic heart disease of native coronary artery without angina pectoris: Secondary | ICD-10-CM | POA: Diagnosis not present

## 2018-03-16 DIAGNOSIS — C7800 Secondary malignant neoplasm of unspecified lung: Secondary | ICD-10-CM | POA: Insufficient documentation

## 2018-03-16 DIAGNOSIS — Z85828 Personal history of other malignant neoplasm of skin: Secondary | ICD-10-CM | POA: Diagnosis not present

## 2018-03-16 DIAGNOSIS — K5730 Diverticulosis of large intestine without perforation or abscess without bleeding: Secondary | ICD-10-CM | POA: Diagnosis not present

## 2018-03-16 DIAGNOSIS — N200 Calculus of kidney: Secondary | ICD-10-CM | POA: Diagnosis not present

## 2018-03-16 DIAGNOSIS — K6389 Other specified diseases of intestine: Secondary | ICD-10-CM | POA: Diagnosis not present

## 2018-03-16 DIAGNOSIS — Z08 Encounter for follow-up examination after completed treatment for malignant neoplasm: Secondary | ICD-10-CM

## 2018-03-16 DIAGNOSIS — R911 Solitary pulmonary nodule: Secondary | ICD-10-CM | POA: Diagnosis not present

## 2018-03-16 DIAGNOSIS — C187 Malignant neoplasm of sigmoid colon: Secondary | ICD-10-CM | POA: Insufficient documentation

## 2018-03-16 DIAGNOSIS — N189 Chronic kidney disease, unspecified: Secondary | ICD-10-CM | POA: Diagnosis not present

## 2018-03-16 LAB — CBC WITH DIFFERENTIAL/PLATELET
Basophils Absolute: 0.1 10*3/uL (ref 0–0.1)
Basophils Relative: 1 %
EOS ABS: 0.4 10*3/uL (ref 0–0.7)
EOS PCT: 4 %
HCT: 40.4 % (ref 40.0–52.0)
HEMOGLOBIN: 14 g/dL (ref 13.0–18.0)
LYMPHS ABS: 3 10*3/uL (ref 1.0–3.6)
LYMPHS PCT: 36 %
MCH: 30.1 pg (ref 26.0–34.0)
MCHC: 34.6 g/dL (ref 32.0–36.0)
MCV: 87 fL (ref 80.0–100.0)
MONOS PCT: 7 %
Monocytes Absolute: 0.6 10*3/uL (ref 0.2–1.0)
NEUTROS PCT: 52 %
Neutro Abs: 4.5 10*3/uL (ref 1.4–6.5)
PLATELETS: 264 10*3/uL (ref 150–440)
RBC: 4.64 MIL/uL (ref 4.40–5.90)
RDW: 13.3 % (ref 11.5–14.5)
WBC: 8.5 10*3/uL (ref 3.8–10.6)

## 2018-03-16 LAB — COMPREHENSIVE METABOLIC PANEL
ALT: 18 U/L (ref 17–63)
AST: 25 U/L (ref 15–41)
Albumin: 4.1 g/dL (ref 3.5–5.0)
Alkaline Phosphatase: 59 U/L (ref 38–126)
Anion gap: 10 (ref 5–15)
BUN: 18 mg/dL (ref 6–20)
CHLORIDE: 104 mmol/L (ref 101–111)
CO2: 23 mmol/L (ref 22–32)
CREATININE: 1.18 mg/dL (ref 0.61–1.24)
Calcium: 9.2 mg/dL (ref 8.9–10.3)
GFR calc Af Amer: >60 mL/min (ref >60)
GFR calc non Af Amer: 57 mL/min — ABNORMAL LOW (ref >60)
Glucose, Bld: 219 mg/dL — ABNORMAL HIGH (ref 65–99)
POTASSIUM: 4.2 mmol/L (ref 3.5–5.1)
Sodium: 137 mmol/L (ref 135–145)
Total Bilirubin: 0.6 mg/dL (ref 0.3–1.2)
Total Protein: 7.3 g/dL (ref 6.5–8.1)

## 2018-03-16 MED ORDER — IOPAMIDOL (ISOVUE-370) INJECTION 76%
100.0000 mL | Freq: Once | INTRAVENOUS | Status: AC | PRN
  Administered 2018-03-16: 100 mL via INTRAVENOUS

## 2018-03-16 NOTE — Progress Notes (Signed)
No new changes noted today 

## 2018-03-16 NOTE — Progress Notes (Signed)
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 Patient is a 77 year old male who presented with evidence of bowel obstruction on 02/06/2017. At that time he had progressive abdominal distention and no bowel movement for about one week. CT abdomen showed an apple core lesion in the descending/sigmoid colon.  2. Patient underwent Hartmann's procedure for obstructing sigmoid colon mass on 02/06/2017. Preoperative CEA was 5.0  3. 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 It is 0 out of 15 lymph nodes were positive for malignancy. Perineural and lymphovascular invasion was present. Margins were negative.pT3N0. MMR stable.  4. Given that he had high risk stage II colon cancer including he presented with obstruction and has LVI and perineural invasion- adjuvant xeloda chemotherapy was recommended for 6 months.   5.Patient had evidence of hand foot syndrome after cycle 3. Cycle 4 delayed by 1 week. Topical urea cream prescribed  6. Patient completed 8 cycles of adjuvant xeloda in October 2018. polps seen in sigmoid and decending colon but negative for malignancy  7. Repeat Ct abdomen after 8 cycles showed no metastatic disease. 6 mm lung nodule noted in RLL  8.  Patient had a repeat colonoscopy in October 2018 which showed polyps in his transverse and descending colon which were taken out and were negative for high-grade dysplasia or malignancy.  Patient subsequently  underwent colostomy takedown procedure by Dr. Burt Knack  Interval history- currently patient reports doing well his energy levels are better.  He denies any abdominal pain or changes in his bowel movements.  Denies any bleeding in his stool or dark melanotic stools.  ECOG PS- 0 Pain scale- 0   Review of systems- Review of Systems  Constitutional: Negative for chills, fever, malaise/fatigue and weight loss.  HENT: Negative for congestion, ear discharge and nosebleeds.   Eyes: Negative for blurred vision.  Respiratory: Negative for cough, hemoptysis, sputum production, shortness of breath and wheezing.   Cardiovascular: Negative for chest pain, palpitations, orthopnea and claudication.  Gastrointestinal: Negative for abdominal pain, blood in stool, constipation, diarrhea, heartburn, melena, nausea and vomiting.  Genitourinary: Negative for dysuria, flank pain, frequency, hematuria and urgency.  Musculoskeletal: Negative for back pain, joint pain and myalgias.  Skin: Negative for rash.  Neurological: Negative for dizziness, tingling, focal weakness, seizures, weakness and headaches.  Endo/Heme/Allergies: Does not bruise/bleed easily.  Psychiatric/Behavioral: Negative for depression and suicidal ideas. The patient does not have insomnia.       No Known Allergies   Past Medical History:  Diagnosis Date  . Cancer ([place])    skin cancer-nose w graft,also shoulder- basal cell per pt  . Cataract    r eye  . Chronic kidney disease    kidney stones 20 y ago per pt  . Colon cancer ([place])   . Diabetes mellitus without complication ([place])   . Eczema   . History of kidney stones   . Hyperlipidemia   . Hypertension      Past Surgical History:  Procedure Laterality Date  . COLECTOMY WITH COLOSTOMY CREATION/HARTMANN  PROCEDURE N/A 02/06/2017   Procedure: COLECTOMY WITH COLOSTOMY CREATION/HARTMANN PROCEDURE;  Surgeon: [name], MD;  Location: [facility] ORS;  Service: General;  Laterality: N/A;    . COLONOSCOPY WITH PROPOFOL N/A 09/04/2017   Procedure: COLONOSCOPY WITH PROPOFOL;  Surgeon: Jonathon Bellows, MD;  Location: [facility] ENDOSCOPY;  Service: Gastroenterology;  Laterality: N/A;  . COLOSTOMY CLOSURE N/A 10/20/2017   Procedure: COLOSTOMY CLOSURE;  Surgeon: [name], MD;  Location: [facility] ORS;  Service: General;  Laterality: N/A;  . CYSTOSCOPY    . FLEXIBLE SIGMOIDOSCOPY N/A 02/06/2017   Procedure: FLEXIBLE SIGMOIDOSCOPY;  Surgeon: Christene Lye, MD;  Location: [facility] ENDOSCOPY;  Service: Endoscopy;  Laterality: N/A;    Social History   Socioeconomic History  . Marital status: Married    Spouse name: Not on file  . Number of children: Not on file  . Years of education: Not on file  . Highest education level: Not on file  Occupational History  . Not on file  Social Needs  . Financial resource strain: Not on file  . Food insecurity:    Worry: Not on file    Inability: Not on file  . Transportation needs:    Medical: Not on file    Non-medical: Not on file  Tobacco Use  . Smoking status: Never Smoker  . Smokeless tobacco: Never Used  Substance and Sexual Activity  . Alcohol use: No  . Drug use: No  . Sexual activity: Not Currently  Lifestyle  . Physical activity:    Days per week: Not on file    Minutes per session: Not on file  . Stress: Not on file  Relationships  . Social connections:    Talks on phone: Not on file    Gets together: Not on file    Attends religious service: Not on file    Active member of club or organization: Not on file    Attends meetings of clubs or organizations: Not on file    Relationship status: Not on file  . Intimate partner violence:    Fear of current or ex partner: Not on file    Emotionally abused: Not on file    Physically abused: Not on file    Forced sexual activity: Not on file  Other Topics Concern  . Not on file  Social History Narrative  . Not on file    Family History  Problem Relation Age of Onset  .  Diabetes Mother   . AAA (abdominal aortic aneurysm) Mother   . Coronary artery disease Mother   . Diabetes Father   . Coronary artery disease Father   . Dementia Father   . Breast cancer Sister      Current Outpatient Medications:  .  aspirin EC 81 MG tablet, Take 81 mg by mouth daily., Disp: , Rfl:  .  glimepiride (AMARYL) 2 MG tablet, Take 2 mg by mouth daily., Disp: , Rfl:  .  loratadine (CLARITIN) 10 MG tablet, Take 10 mg by mouth daily., Disp: , Rfl:  .  losartan (COZAAR) 25 MG tablet, Take 25 mg by mouth daily., Disp: , Rfl:  .  metFORMIN (GLUCOPHAGE) 1000 MG tablet, Take 1,000-1,500 tablets by mouth See admin instructions. Take 1000 mg by mouth in the morning and take 1500 mg by mouth at supper, Disp: , Rfl:  .  triamcinolone (KENALOG) 0.025 % cream, Apply 1 application topically daily., Disp: , Rfl:  .  Apremilast (OTEZLA) 30 MG TABS, Take 30 mg by  mouth daily. , Disp: , Rfl:  .  ferrous sulfate 324 (65 Fe) MG TBEC, Take 324 mg by mouth daily., Disp: , Rfl:  .  naproxen sodium (ALEVE) 220 MG tablet, Take 220 mg by mouth 2 (two) times daily as needed (for pain or headache). , Disp: , Rfl:  .  pravastatin (PRAVACHOL) 20 MG tablet, Take 20 mg by mouth daily., Disp: , Rfl:   Physical exam:  Vitals:   03/16/18 1105  BP: 132/77  Pulse: 67  Resp: 18  Temp: (!) 97.3 F (36.3 C)  TempSrc: Tympanic  SpO2: 95%  Weight: 191 lb 3.2 oz (86.7 kg)  Height: 5' 6" (1.676 m)   Physical Exam  Constitutional: He is oriented to person, place, and time. He appears well-developed and well-nourished.  HENT:  Head: Normocephalic and atraumatic.  Eyes: Pupils are equal, round, and reactive to light. EOM are normal.  Neck: Normal range of motion.  Cardiovascular: Normal rate, regular rhythm and normal heart sounds.  Pulmonary/Chest: Effort normal and breath sounds normal.  Abdominal: Soft. Bowel sounds are normal.  Vertical surgical scar as well as colostomy takedown scar has healed well    Neurological: He is alert and oriented to person, place, and time.  Skin: Skin is warm and dry.     CMP Latest Ref Rng & Units 03/16/2018  Glucose 65 - 99 mg/dL 219(H)  BUN 6 - 20 mg/dL 18  Creatinine 0.61 - 1.24 mg/dL 1.18  Sodium 135 - 145 mmol/L 137  Potassium 3.5 - 5.1 mmol/L 4.2  Chloride 101 - 111 mmol/L 104  CO2 22 - 32 mmol/L 23  Calcium 8.9 - 10.3 mg/dL 9.2  Total Protein 6.5 - 8.1 g/dL 7.3  Total Bilirubin 0.3 - 1.2 mg/dL 0.6  Alkaline Phos 38 - 126 U/L 59  AST 15 - 41 U/L 25  ALT 17 - 63 U/L 18   CBC Latest Ref Rng & Units 03/16/2018  WBC 3.8 - 10.6 K/uL 8.5  Hemoglobin 13.0 - 18.0 g/dL 14.0  Hematocrit 40.0 - 52.0 % 40.4  Platelets 150 - 440 K/uL 264     Assessment and plan- Patient is a 77 y.o. male with stage II colon cancer T3N0M0 s/p surgery currently on adjuvant xeloda s/p8cycles here for colon cancer surveillance visit  Patient was supposed to see me after CT scans which have not been done yet.  Clinically patient is doing well and there is no evidence of recurrence on today's exam.  CBC and CMP are within normal limits.  He will get a repeat CT chest abdomen and pelvis today.  I will see him back in 6 months time with CBC CMP and CEA as well as repeat CT chest abdomen and pelvis with contrast.  He will follow-up with Dr. Vicente Males for surveillance colonoscopy 1 year from his prior one.   Visit Diagnosis 1. 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[place])   2. Encounter for follow-up surveillance of colon cancer      Dr. Randa Evens, MD, MPH [facility] [phone] 03/16/2018 4:49 PM

## 2018-03-17 ENCOUNTER — Other Ambulatory Visit: Payer: Self-pay | Admitting: Oncology

## 2018-03-17 DIAGNOSIS — C189 Malignant neoplasm of colon, unspecified: Secondary | ICD-10-CM

## 2018-03-17 LAB — CEA: CEA: 2.1 ng/mL (ref 0.0–4.7)

## 2018-03-18 ENCOUNTER — Ambulatory Visit: Payer: Medicare Other

## 2018-03-19 ENCOUNTER — Ambulatory Visit
Admission: RE | Admit: 2018-03-19 | Discharge: 2018-03-19 | Disposition: A | Discharge status: Home / Self Care | Payer: Medicare Other | Source: Ambulatory Visit | Attending: Oncology | Admitting: Oncology

## 2018-03-19 DIAGNOSIS — K5730 Diverticulosis of large intestine without perforation or abscess without bleeding: Secondary | ICD-10-CM | POA: Insufficient documentation

## 2018-03-19 DIAGNOSIS — R918 Other nonspecific abnormal finding of lung field: Secondary | ICD-10-CM | POA: Insufficient documentation

## 2018-03-19 DIAGNOSIS — C189 Malignant neoplasm of colon, unspecified: Secondary | ICD-10-CM | POA: Diagnosis present

## 2018-03-19 DIAGNOSIS — K8020 Calculus of gallbladder without cholecystitis without obstruction: Secondary | ICD-10-CM | POA: Insufficient documentation

## 2018-03-19 DIAGNOSIS — I700 Atherosclerosis of aorta: Secondary | ICD-10-CM | POA: Insufficient documentation

## 2018-03-19 LAB — GLUCOSE, CAPILLARY: GLUCOSE-CAPILLARY: 205 mg/dL — AB (ref 65–99)

## 2018-03-19 MED ORDER — FLUDEOXYGLUCOSE F - 18 (FDG) INJECTION
10.2000 | Freq: Once | INTRAVENOUS | Status: AC
  Administered 2018-03-19: 10.2 via INTRAVENOUS

## 2018-03-23 ENCOUNTER — Encounter: Payer: Self-pay | Admitting: Oncology

## 2018-03-23 ENCOUNTER — Telehealth: Payer: Self-pay | Admitting: Oncology

## 2018-03-23 ENCOUNTER — Inpatient Hospital Stay (HOSPITAL_BASED_OUTPATIENT_CLINIC_OR_DEPARTMENT_OTHER): Payer: Medicare Other | Admitting: Oncology

## 2018-03-23 VITALS — BP 156/70 | HR 77 | Temp 97.1°F | Ht 66.0 in | Wt 190.0 lb

## 2018-03-23 DIAGNOSIS — I129 Hypertensive chronic kidney disease with stage 1 through stage 4 chronic kidney disease, or unspecified chronic kidney disease: Secondary | ICD-10-CM | POA: Diagnosis not present

## 2018-03-23 DIAGNOSIS — E119 Type 2 diabetes mellitus without complications: Secondary | ICD-10-CM | POA: Diagnosis not present

## 2018-03-23 DIAGNOSIS — Z85828 Personal history of other malignant neoplasm of skin: Secondary | ICD-10-CM | POA: Diagnosis not present

## 2018-03-23 DIAGNOSIS — C187 Malignant neoplasm of sigmoid colon: Secondary | ICD-10-CM | POA: Diagnosis not present

## 2018-03-23 DIAGNOSIS — R911 Solitary pulmonary nodule: Secondary | ICD-10-CM | POA: Diagnosis not present

## 2018-03-23 DIAGNOSIS — E1122 Type 2 diabetes mellitus with diabetic chronic kidney disease: Secondary | ICD-10-CM | POA: Diagnosis not present

## 2018-03-23 DIAGNOSIS — N189 Chronic kidney disease, unspecified: Secondary | ICD-10-CM

## 2018-03-23 DIAGNOSIS — C189 Malignant neoplasm of colon, unspecified: Secondary | ICD-10-CM

## 2018-03-23 DIAGNOSIS — Z7189 Other specified counseling: Secondary | ICD-10-CM

## 2018-03-23 DIAGNOSIS — Z933 Colostomy status: Secondary | ICD-10-CM | POA: Diagnosis not present

## 2018-03-23 NOTE — Progress Notes (Signed)
No new changes noted today 

## 2018-03-23 NOTE — Progress Notes (Signed)
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 Patient is a 77 year old male who presented with evidence of bowel obstruction on 02/06/2017. At that time he had progressive abdominal distention and no bowel movement for about one week. CT abdomen showed an apple core lesion in the descending/sigmoid colon.  2. Patient underwent Hartmann's procedure for obstructing sigmoid colon mass on 02/06/2017. Preoperative CEA was 5.0  3. 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 It is 0 out of 15 lymph nodes were positive for malignancy. Perineural and lymphovascular invasion was present. Margins were negative.pT3N0. MMR stable.  4. Given that he had high risk stage II colon cancer including he presented with obstruction and has LVI and perineural invasion- adjuvant xeloda chemotherapy was recommended for 6 months.   5.Patient had evidence of hand foot syndrome after cycle 3. Cycle 4 delayed by 1 week. Topical urea cream prescribed  6. Patient completed 8 cycles of adjuvant xeloda in October 2018. polps seen in sigmoid and decending colon but negative for malignancy  7. Repeat Ct abdomen after 8 cycles showed no metastatic disease. 6 mm lung nodule noted in RLL  8.  Patient had a repeat colonoscopy in October 2018 which showed polyps in his transverse and descending colon which were taken out and were negative for high-grade dysplasia or  malignancy.  Patient subsequently underwent colostomy takedown procedure by Dr. Burt Knack  9. Repeat CT chest abdomen/ pelvis showed increase in the size of previously seen lung nodules largest being 1.4 cm consistent with metastatic disease  Interval history- Patient is doing well currently. Denies any complaints  ECOG PS- 0 Pain scale- 0  Review of systems- Review of Systems  Constitutional: Negative for chills, fever, malaise/fatigue and weight loss.  HENT: Negative for congestion, ear discharge and nosebleeds.   Eyes: Negative for blurred vision.  Respiratory: Negative for cough, hemoptysis, sputum production, shortness of breath and wheezing.   Cardiovascular: Negative for chest pain, palpitations, orthopnea and claudication.  Gastrointestinal: Negative for abdominal pain, blood in stool, constipation, diarrhea, heartburn, melena, nausea and vomiting.  Genitourinary: Negative for dysuria, flank pain, frequency, hematuria and urgency.  Musculoskeletal: Negative for back pain, joint pain and myalgias.  Skin: Negative for rash.  Neurological: Negative for dizziness, tingling, focal weakness, seizures, weakness and headaches.  Endo/Heme/Allergies: Does not bruise/bleed easily.  Psychiatric/Behavioral: Negative for depression and suicidal ideas. The patient does not have insomnia.       No Known Allergies   Past Medical History:  Diagnosis Date  . Cancer ([place])    skin cancer-nose w graft,also shoulder- basal cell per pt  . Cataract    r eye  . Chronic kidney disease    kidney stones 20 y ago per pt  . Colon cancer ([place])   . Diabetes mellitus without complication ([place])   . Eczema   . History of kidney stones   . Hyperlipidemia   . Hypertension      Past Surgical History:  Procedure Laterality Date  . COLECTOMY WITH  COLOSTOMY CREATION/HARTMANN PROCEDURE N/A 02/06/2017   Procedure: COLECTOMY WITH COLOSTOMY CREATION/HARTMANN PROCEDURE;  Surgeon: [name], MD;   Location: [facility] ORS;  Service: General;  Laterality: N/A;  . COLONOSCOPY WITH PROPOFOL N/A 09/04/2017   Procedure: COLONOSCOPY WITH PROPOFOL;  Surgeon: Jonathon Bellows, MD;  Location: [facility] ENDOSCOPY;  Service: Gastroenterology;  Laterality: N/A;  . COLOSTOMY CLOSURE N/A 10/20/2017   Procedure: COLOSTOMY CLOSURE;  Surgeon: [name], MD;  Location: [facility] ORS;  Service: General;  Laterality: N/A;  . CYSTOSCOPY    . FLEXIBLE SIGMOIDOSCOPY N/A 02/06/2017   Procedure: FLEXIBLE SIGMOIDOSCOPY;  Surgeon: Christene Lye, MD;  Location: [facility] ENDOSCOPY;  Service: Endoscopy;  Laterality: N/A;    Social History   Socioeconomic History  . Marital status: Married    Spouse name: Not on file  . Number of children: Not on file  . Years of education: Not on file  . Highest education level: Not on file  Occupational History  . Not on file  Social Needs  . Financial resource strain: Not on file  . Food insecurity:    Worry: Not on file    Inability: Not on file  . Transportation needs:    Medical: Not on file    Non-medical: Not on file  Tobacco Use  . Smoking status: Never Smoker  . Smokeless tobacco: Never Used  Substance and Sexual Activity  . Alcohol use: No  . Drug use: No  . Sexual activity: Not Currently  Lifestyle  . Physical activity:    Days per week: Not on file    Minutes per session: Not on file  . Stress: Not on file  Relationships  . Social connections:    Talks on phone: Not on file    Gets together: Not on file    Attends religious service: Not on file    Active member of club or organization: Not on file    Attends meetings of clubs or organizations: Not on file    Relationship status: Not on file  . Intimate partner violence:    Fear of current or ex partner: Not on file    Emotionally abused: Not on file    Physically abused: Not on file    Forced sexual activity: Not on file  Other Topics Concern  . Not on file  Social History Narrative  . Not on file      Family History  Problem Relation Age of Onset  . Diabetes Mother   . AAA (abdominal aortic aneurysm) Mother   . Coronary artery disease Mother   . Diabetes Father   . Coronary artery disease Father   . Dementia Father   . Breast cancer Sister      Current Outpatient Medications:  .  Apremilast (OTEZLA) 30 MG TABS, Take 30 mg by mouth daily. , Disp: , Rfl:  .  aspirin EC 81 MG tablet, Take 81 mg by mouth daily., Disp: , Rfl:  .  glimepiride (AMARYL) 2 MG tablet, Take 2 mg by mouth daily., Disp: , Rfl:  .  loratadine (CLARITIN) 10 MG tablet, Take 10 mg by mouth daily., Disp: , Rfl:  .  losartan (COZAAR) 25 MG tablet, Take 25 mg by mouth daily., Disp: , Rfl:  .  metFORMIN (GLUCOPHAGE) 1000 MG tablet, Take 1,000-1,500 tablets by mouth See admin instructions. Take 1000 mg by mouth in the morning and take 1500 mg by mouth at supper, Disp: , Rfl:  .  pravastatin (PRAVACHOL) 20 MG tablet,  Take 20 mg by mouth daily., Disp: , Rfl:  .  triamcinolone (KENALOG) 0.025 % cream, Apply 1 application topically daily., Disp: , Rfl:  .  ferrous sulfate 324 (65 Fe) MG TBEC, Take 324 mg by mouth daily., Disp: , Rfl:  .  naproxen sodium (ALEVE) 220 MG tablet, Take 220 mg by mouth 2 (two) times daily as needed (for pain or headache). , Disp: , Rfl:   Physical exam:  Vitals:   03/23/18 1359  BP: (!) 156/70  Pulse: 77  Temp: (!) 97.1 F (36.2 C)  SpO2: 96%  Weight: 190 lb (86.2 kg)  Height: _0  (1.676 m)   Physical Exam  Constitutional: He is oriented to person, place, and time. He appears well-developed and well-nourished.  HENT:  Head: Normocephalic and atraumatic.  Eyes: Pupils are equal, round, and reactive to light. EOM are normal.  Neck: Normal range of motion.  Cardiovascular: Normal rate, regular rhythm and normal heart sounds.  Pulmonary/Chest: Effort normal and breath sounds normal.  Abdominal: Soft. Bowel sounds are normal.  Neurological: He is alert and oriented to person,  place, and time.  Skin: Skin is warm and dry.     CMP Latest Ref Rng & Units 03/16/2018  Glucose 65 - 99 mg/dL 219(H)  BUN 6 - 20 mg/dL 18  Creatinine 0.61 - 1.24 mg/dL 1.18  Sodium 135 - 145 mmol/L 137  Potassium 3.5 - 5.1 mmol/L 4.2  Chloride 101 - 111 mmol/L 104  CO2 22 - 32 mmol/L 23  Calcium 8.9 - 10.3 mg/dL 9.2  Total Protein 6.5 - 8.1 g/dL 7.3  Total Bilirubin 0.3 - 1.2 mg/dL 0.6  Alkaline Phos 38 - 126 U/L 59  AST 15 - 41 U/L 25  ALT 17 - 63 U/L 18   CBC Latest Ref Rng & Units 03/16/2018  WBC 3.8 - 10.6 K/uL 8.5  Hemoglobin 13.0 - 18.0 g/dL 14.0  Hematocrit 40.0 - 52.0 % 40.4  Platelets 150 - 440 K/uL 264    No images are attached to the encounter.  Ct Chest W Contrast  Result Date: 03/17/2018 CLINICAL DATA:  77 year old male with history of colon cancer diagnosed 1 year ago status post colonic resection. EXAM: CT CHEST, ABDOMEN, AND PELVIS WITH CONTRAST TECHNIQUE: Multidetector CT imaging of the chest, abdomen and pelvis was performed following the standard protocol during bolus administration of intravenous contrast. CONTRAST:  142m ISOVUE-370 IOPAMIDOL (ISOVUE-370) INJECTION 76% COMPARISON:  CT the abdomen and pelvis.  Chest CT 03/17/2017. FINDINGS: CT CHEST FINDINGS Cardiovascular: Heart size is normal. There is no significant pericardial fluid, thickening or pericardial calcification. There is aortic atherosclerosis, as well as atherosclerosis of the great vessels of the mediastinum and the coronary arteries, including calcified atherosclerotic plaque in the left main, left anterior descending, left circumflex and right coronary arteries. Mediastinum/Nodes: No pathologically enlarged mediastinal or hilar lymph nodes. Esophagus is unremarkable in appearance. No axillary lymphadenopathy. Lungs/Pleura: All previously noted pulmonary nodules have increased in size, with the largest of these currently measuring 14 x 14 mm in the anterior aspect of the left upper lobe (axial image 80  of series 4), previously only 6 mm. No definite new nodules are noted. No acute consolidative airspace disease. No pleural effusions. Musculoskeletal: There are no aggressive appearing lytic or blastic lesions noted in the visualized portions of the skeleton. CT ABDOMEN PELVIS FINDINGS Hepatobiliary: No suspicious cystic or solid hepatic lesions. No intra or extrahepatic biliary ductal dilatation. Calcified gallstones measuring 1 cm lying dependently  in the neck of the gallbladder. No findings to suggest an acute cholecystitis at this time. Pancreas: No pancreatic mass. No pancreatic ductal dilatation. No pancreatic or peripancreatic fluid or inflammatory changes. Spleen: Unremarkable. Adrenals/Urinary Tract: 4 mm nonobstructive calculus in the upper pole collecting system of the right kidney. Bilateral kidneys and bilateral adrenal glands are otherwise unremarkable in appearance. No hydroureteronephrosis. Urinary bladder is normal in appearance. Stomach/Bowel: Normal appearance of the stomach. No pathologic dilatation of small bowel or colon. Previously noted left lower quadrant colostomy has been taken down, with a new side to side anastomosis in the left lower quadrant. Several colonic diverticulae are noted, without surrounding inflammatory changes to suggest an acute diverticulitis at this time. Normal appendix. Vascular/Lymphatic: Aortic atherosclerosis, without evidence of aneurysm or dissection in the abdominal or pelvic vasculature. No lymphadenopathy noted in the abdomen. Reproductive: Prostate gland and seminal vesicles are unremarkable in appearance. Other: No significant volume of ascites.  No pneumoperitoneum. Musculoskeletal: There are no aggressive appearing lytic or blastic lesions noted in the visualized portions of the skeleton. IMPRESSION: 1. Progressive metastatic disease to the lungs, with interval increase in size of all previously noted pulmonary nodules, as discussed above. 2. No evidence of  metastatic disease in the abdomen or pelvis. 3. Status post left lower quadrant colostomy takedown with new side to side anastomosis in the left lower quadrant. 4. Colonic diverticulosis without evidence of acute diverticulitis at this time. 5. 4 mm nonobstructive calculus in the upper pole collecting system of the right kidney. No ureteral stones or findings of urinary tract obstruction are noted at this time. 6. Cholelithiasis without findings to suggest an acute cholecystitis. 7. Aortic atherosclerosis, in addition to left main and 3 vessel coronary artery disease. Assessment for potential risk factor modification, dietary therapy or pharmacologic therapy may be warranted, if clinically indicated. 8. Additional incidental findings, as above. Aortic Atherosclerosis (ICD10-I70.0). Electronically Signed   By: Vinnie Langton M.D.   On: 03/17/2018 10:34   Ct Abdomen Pelvis W Contrast  Result Date: 03/17/2018 CLINICAL DATA:  77 year old male with history of colon cancer diagnosed 1 year ago status post colonic resection. EXAM: CT CHEST, ABDOMEN, AND PELVIS WITH CONTRAST TECHNIQUE: Multidetector CT imaging of the chest, abdomen and pelvis was performed following the standard protocol during bolus administration of intravenous contrast. CONTRAST:  167m ISOVUE-370 IOPAMIDOL (ISOVUE-370) INJECTION 76% COMPARISON:  CT the abdomen and pelvis.  Chest CT 03/17/2017. FINDINGS: CT CHEST FINDINGS Cardiovascular: Heart size is normal. There is no significant pericardial fluid, thickening or pericardial calcification. There is aortic atherosclerosis, as well as atherosclerosis of the great vessels of the mediastinum and the coronary arteries, including calcified atherosclerotic plaque in the left main, left anterior descending, left circumflex and right coronary arteries. Mediastinum/Nodes: No pathologically enlarged mediastinal or hilar lymph nodes. Esophagus is unremarkable in appearance. No axillary lymphadenopathy.  Lungs/Pleura: All previously noted pulmonary nodules have increased in size, with the largest of these currently measuring 14 x 14 mm in the anterior aspect of the left upper lobe (axial image 80 of series 4), previously only 6 mm. No definite new nodules are noted. No acute consolidative airspace disease. No pleural effusions. Musculoskeletal: There are no aggressive appearing lytic or blastic lesions noted in the visualized portions of the skeleton. CT ABDOMEN PELVIS FINDINGS Hepatobiliary: No suspicious cystic or solid hepatic lesions. No intra or extrahepatic biliary ductal dilatation. Calcified gallstones measuring 1 cm lying dependently in the neck of the gallbladder. No findings to suggest an acute cholecystitis  at this time. Pancreas: No pancreatic mass. No pancreatic ductal dilatation. No pancreatic or peripancreatic fluid or inflammatory changes. Spleen: Unremarkable. Adrenals/Urinary Tract: 4 mm nonobstructive calculus in the upper pole collecting system of the right kidney. Bilateral kidneys and bilateral adrenal glands are otherwise unremarkable in appearance. No hydroureteronephrosis. Urinary bladder is normal in appearance. Stomach/Bowel: Normal appearance of the stomach. No pathologic dilatation of small bowel or colon. Previously noted left lower quadrant colostomy has been taken down, with a new side to side anastomosis in the left lower quadrant. Several colonic diverticulae are noted, without surrounding inflammatory changes to suggest an acute diverticulitis at this time. Normal appendix. Vascular/Lymphatic: Aortic atherosclerosis, without evidence of aneurysm or dissection in the abdominal or pelvic vasculature. No lymphadenopathy noted in the abdomen. Reproductive: Prostate gland and seminal vesicles are unremarkable in appearance. Other: No significant volume of ascites.  No pneumoperitoneum. Musculoskeletal: There are no aggressive appearing lytic or blastic lesions noted in the visualized  portions of the skeleton. IMPRESSION: 1. Progressive metastatic disease to the lungs, with interval increase in size of all previously noted pulmonary nodules, as discussed above. 2. No evidence of metastatic disease in the abdomen or pelvis. 3. Status post left lower quadrant colostomy takedown with new side to side anastomosis in the left lower quadrant. 4. Colonic diverticulosis without evidence of acute diverticulitis at this time. 5. 4 mm nonobstructive calculus in the upper pole collecting system of the right kidney. No ureteral stones or findings of urinary tract obstruction are noted at this time. 6. Cholelithiasis without findings to suggest an acute cholecystitis. 7. Aortic atherosclerosis, in addition to left main and 3 vessel coronary artery disease. Assessment for potential risk factor modification, dietary therapy or pharmacologic therapy may be warranted, if clinically indicated. 8. Additional incidental findings, as above. Aortic Atherosclerosis (ICD10-I70.0). Electronically Signed   By: Vinnie Langton M.D.   On: 03/17/2018 10:34   Nm Pet Image Initial (pi) Skull Base To Thigh  Result Date: 03/19/2018 CLINICAL DATA:  Subsequent treatment strategy for sigmoid colon adenocarcinoma. EXAM: NUCLEAR MEDICINE PET SKULL BASE TO THIGH TECHNIQUE: 10.2 mCi F-18 FDG was injected intravenously. Full-ring PET imaging was performed from the skull base to thigh after the radiotracer. CT data was obtained and used for attenuation correction and anatomic localization. Fasting blood glucose: 205 mg/dl COMPARISON:  Chest abdomen and pelvis CT 03/16/2018. FINDINGS: Mediastinal blood pool activity: SUV max 2.7 NECK: No hypermetabolic lymph nodes in the neck. Incidental CT findings: none CHEST: Pulmonary nodules noted on recent diagnostic chest CT are hypermetabolic. 14 mm anterior left upper lobe nodule measured on the previous study (image 99/series 4 today) demonstrates SUV max = 2.8. 12 mm central left upper lobe  nodule (image 91/series 4 today) is hypermetabolic with SUV max = 3.0. Smaller bilateral pulmonary nodules are below the threshold for resolution on PET imaging. Incidental CT findings: Atherosclerotic calcification is noted in the wall of the thoracic aorta. Coronary artery calcification is evident. ABDOMEN/PELVIS: Typical mottled uptake identified in the liver parenchyma without definite hypermetabolic metastatic lesion. No evidence for hypermetabolic metastatic disease in the abdomen or pelvis. Incidental CT findings: There is abdominal aortic atherosclerosis without aneurysm. Small calcified gallstones are evident. Diffuse colonic diverticulosis without diverticulitis. Colonic anastomosis is identified in the proximal sigmoid region. Small bilateral groin hernias contain only fat. SKELETON: Mottled marrow uptake without definite hypermetabolic bony metastases. Incidental CT findings: none IMPRESSION: 1. Hypermetabolic pulmonary nodules compatible with metastatic disease. The smaller nodules in each lung are below the  threshold for resolution on PET imaging. 2. No evidence for hypermetabolic metastatic disease in the neck, abdomen, or pelvis. 3. Cholelithiasis. 4. Colonic diverticulosis. 5. Aortic Atherosclerois (ICD10-170.0) Electronically Signed   By: Misty Stanley M.D.   On: 03/19/2018 14:03     Assessment and plan- Patient is a 77 y.o. male with stage II colon cancer T3N0M0 s/p surgery currently on adjuvant xeloda s/p8cycles here to discuss PET/CT results  CT chest done this month showed increase in size of lung nodules over the last 1 year. 2 of them are between 1.2-1.4 cm on PET/CT scan. Others are sub cm ~67m in size. These nodules have a low SUV uptake on PET. No other evidence of metastatic disease. I have discussed this case with Dr. HAnselm Pancoastfrom IR who suggested that IR guided biopsy of one of the larger LUL nodules could be attempted.  We will plan to set that up in the next 2 weeks  I will  see him back on 6/36 or 6/7 to discuss these results  Discussed that given the fact that patient had subcm lung nodules at diagnosis last year, he most likely had metastatic disease even back then. Overall these lung nodules have grown slowly over the last 1 year. Options therefore post biopsy would be close surveillance and initiate treatment in about 3 months time so patient can enjoy his summer which he usually likes to spend at the beach. We can repeat ct scans in 3 months time, plan for port placement at that time and initiate FOLFOX chemotherapy at that time  Patient is in agreement with the above plan. Patient understands that chemotherapy in stage IV colon cancer is palliative and not curative to shrink the tumor/ stabilize and improve his QOL and longevity.     Visit Diagnosis 1. 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HCC)   2. Lung nodule   3. Goals of care, counseling/discussion      Dr. ARanda Evens MD, MPH [id number]/14/2019 9:44 PM

## 2018-03-25 ENCOUNTER — Telehealth: Payer: Self-pay | Admitting: *Deleted

## 2018-03-25 NOTE — Telephone Encounter (Signed)
Called pt to go over bx date and time and it is scheduled for 5/23 to arrive at 7:30 for a 8:30 procedure.  Patient to be n.p.o. after midnight the night before procedure.  Patient will need a driver to bring him to the procedure and take him back home.  Any meds patient needs to take that a.m. with a sip of water only.  Patient to arrive at the medical mall of [facility] to register.  Patient agreeable to plan above and already has an appointment to come back and see Dr. [name] on  June 7.

## 2018-03-29 LAB — SURGICAL PATHOLOGY

## 2018-03-31 ENCOUNTER — Encounter: Payer: Self-pay | Admitting: Oncology

## 2018-03-31 ENCOUNTER — Other Ambulatory Visit: Payer: Self-pay | Admitting: Radiology

## 2018-04-01 ENCOUNTER — Ambulatory Visit
Admission: RE | Admit: 2018-04-01 | Discharge: 2018-04-01 | Disposition: A | Discharge status: Home / Self Care | Payer: Medicare Other | Source: Ambulatory Visit | Attending: Oncology | Admitting: Oncology

## 2018-04-01 ENCOUNTER — Ambulatory Visit
Admission: RE | Admit: 2018-04-01 | Discharge: 2018-04-01 | Disposition: A | Discharge status: Home / Self Care | Payer: Medicare Other | Source: Ambulatory Visit | Attending: Interventional Radiology | Admitting: Interventional Radiology

## 2018-04-01 DIAGNOSIS — Z9049 Acquired absence of other specified parts of digestive tract: Secondary | ICD-10-CM | POA: Diagnosis not present

## 2018-04-01 DIAGNOSIS — C7802 Secondary malignant neoplasm of left lung: Secondary | ICD-10-CM | POA: Diagnosis not present

## 2018-04-01 DIAGNOSIS — C189 Malignant neoplasm of colon, unspecified: Secondary | ICD-10-CM | POA: Diagnosis not present

## 2018-04-01 DIAGNOSIS — R911 Solitary pulmonary nodule: Secondary | ICD-10-CM | POA: Diagnosis present

## 2018-04-01 DIAGNOSIS — Z9889 Other specified postprocedural states: Secondary | ICD-10-CM

## 2018-04-01 LAB — APTT: aPTT: 28 seconds (ref 24–36)

## 2018-04-01 LAB — GLUCOSE, CAPILLARY: Glucose-Capillary: 180 mg/dL — ABNORMAL HIGH (ref 65–99)

## 2018-04-01 LAB — CBC
HCT: 42.6 % (ref 40.0–52.0)
Hemoglobin: 14.3 g/dL (ref 13.0–18.0)
MCH: 29.3 pg (ref 26.0–34.0)
MCHC: 33.5 g/dL (ref 32.0–36.0)
MCV: 87.5 fL (ref 80.0–100.0)
PLATELETS: 279 10*3/uL (ref 150–440)
RBC: 4.88 MIL/uL (ref 4.40–5.90)
RDW: 13.6 % (ref 11.5–14.5)
WBC: 8.2 10*3/uL (ref 3.8–10.6)

## 2018-04-01 LAB — PROTIME-INR
INR: 0.86
Prothrombin Time: 11.6 seconds (ref 11.4–15.2)

## 2018-04-01 MED ORDER — HYDROCODONE-ACETAMINOPHEN 5-325 MG PO TABS: 1.0000 | ORAL_TABLET | ORAL | Status: DC | PRN

## 2018-04-01 MED ORDER — LIDOCAINE HCL (PF) 1 % IJ SOLN
INTRAMUSCULAR | Status: AC | PRN
  Administered 2018-04-01: 15 mL

## 2018-04-01 MED ORDER — MIDAZOLAM HCL 5 MG/5ML IJ SOLN
INTRAMUSCULAR | Status: AC
  Filled 2018-04-01: qty 5

## 2018-04-01 MED ORDER — SODIUM CHLORIDE 0.9 % IV SOLN
INTRAVENOUS | Status: DC
  Administered 2018-04-01: 08:00:00 via INTRAVENOUS

## 2018-04-01 MED ORDER — FENTANYL CITRATE (PF) 100 MCG/2ML IJ SOLN
INTRAMUSCULAR | Status: AC
  Filled 2018-04-01: qty 4

## 2018-04-01 MED ORDER — FENTANYL CITRATE (PF) 100 MCG/2ML IJ SOLN
INTRAMUSCULAR | Status: AC | PRN
  Administered 2018-04-01: 50 ug via INTRAVENOUS

## 2018-04-01 MED ORDER — MIDAZOLAM HCL 5 MG/5ML IJ SOLN
INTRAMUSCULAR | Status: AC | PRN
  Administered 2018-04-01: 1 mg via INTRAVENOUS
  Administered 2018-04-01: 0.5 mg via INTRAVENOUS

## 2018-04-01 NOTE — Procedures (Signed)
  Procedure: CT core biopsy LUL nodule 18g x2 EBL:   minimal Complications:  none immediate  See full dictation in [facility].  Dillard Cannon MD Main # [phone] Pager  [phone]

## 2018-04-02 ENCOUNTER — Other Ambulatory Visit: Payer: Self-pay | Admitting: Pathology

## 2018-04-02 LAB — SURGICAL PATHOLOGY

## 2018-04-13 ENCOUNTER — Other Ambulatory Visit: Payer: Self-pay | Admitting: Oncology

## 2018-04-16 ENCOUNTER — Encounter: Payer: Self-pay | Admitting: Oncology

## 2018-04-16 ENCOUNTER — Inpatient Hospital Stay: Payer: Medicare Other | Attending: Oncology | Admitting: Oncology

## 2018-04-16 VITALS — BP 144/80 | HR 69 | Temp 97.6°F | Resp 18 | Ht 66.0 in | Wt 193.8 lb

## 2018-04-16 DIAGNOSIS — N189 Chronic kidney disease, unspecified: Secondary | ICD-10-CM | POA: Insufficient documentation

## 2018-04-16 DIAGNOSIS — Z933 Colostomy status: Secondary | ICD-10-CM | POA: Insufficient documentation

## 2018-04-16 DIAGNOSIS — Z803 Family history of malignant neoplasm of breast: Secondary | ICD-10-CM | POA: Diagnosis not present

## 2018-04-16 DIAGNOSIS — E1122 Type 2 diabetes mellitus with diabetic chronic kidney disease: Secondary | ICD-10-CM

## 2018-04-16 DIAGNOSIS — C187 Malignant neoplasm of sigmoid colon: Secondary | ICD-10-CM | POA: Insufficient documentation

## 2018-04-16 DIAGNOSIS — I129 Hypertensive chronic kidney disease with stage 1 through stage 4 chronic kidney disease, or unspecified chronic kidney disease: Secondary | ICD-10-CM | POA: Insufficient documentation

## 2018-04-16 DIAGNOSIS — C7800 Secondary malignant neoplasm of unspecified lung: Secondary | ICD-10-CM | POA: Diagnosis not present

## 2018-04-16 DIAGNOSIS — C189 Malignant neoplasm of colon, unspecified: Secondary | ICD-10-CM

## 2018-04-16 DIAGNOSIS — Z7189 Other specified counseling: Secondary | ICD-10-CM

## 2018-04-16 NOTE — Progress Notes (Signed)
No new changes noted today 

## 2018-04-19 ENCOUNTER — Telehealth: Payer: Self-pay | Admitting: Surgery

## 2018-04-19 NOTE — Telephone Encounter (Signed)
Cancer center is calling patient will need an appointment sometime the first week in August maybe the first, and second with Dr. Burt Knack for a port placement. Please put patient on the recall list.

## 2018-04-19 NOTE — Progress Notes (Signed)
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 Patient is a 77 year old male who presented with evidence of bowel obstruction on 02/06/2017. At that time he had progressive abdominal distention and no bowel movement for about one week. CT abdomen showed an apple core lesion in the descending/sigmoid colon.  2. Patient underwent Hartmann's procedure for obstructing sigmoid colon mass on 02/06/2017. Preoperative CEA was 5.0  3. 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 It is 0 out of 15 lymph nodes were positive for malignancy. Perineural and lymphovascular invasion was present. Margins were negative.pT3N0. MMR stable.  4. Given that he had high risk stage II colon cancer including he presented with obstruction and has LVI and perineural invasion- adjuvant xeloda chemotherapy was recommended for 6 months.   5.Patient had evidence of hand foot syndrome after cycle 3. Cycle 4 delayed by 1 week. Topical urea cream prescribed  6. Patient completed 8 cycles of adjuvant xeloda in October 2018. polps seen in sigmoid and decending colon but negative for malignancy  7. Repeat Ct abdomen after 8 cycles showed no metastatic disease. 6 mm lung nodule noted in RLL  8.Patient had a repeat colonoscopy in October 2018 which showed polyps in his transverse and descending colon which were taken out and were negative for high-grade dysplasia  or malignancy. Patient subsequently underwent colostomy takedown procedure by Dr. Burt Knack  9. Repeat CT chest abdomen/ pelvis showed increase in the size of previously seen lung nodules largest being 1.4 cm consistent with metastatic disease. PET/CT showed mild uptake in 2 lung nodules. Others were not hypermetabolic. No other evidence of metastatic disease  10. Repeat lung biopsy consistent with colon adenocarcinoma. Comprehensive RAS panel testing showed mutant KRAS   Interval history- patient feels well. He denies any appetite loss. Weight has been stable. Denies any abdominal pain or sob  ECOG PS- 0 Pain scale- 0  Review of systems- Review of Systems  Constitutional: Negative for chills, fever, malaise/fatigue and weight loss.  HENT: Negative for congestion, ear discharge and nosebleeds.   Eyes: Negative for blurred vision.  Respiratory: Negative for cough, hemoptysis, sputum production, shortness of breath and wheezing.   Cardiovascular: Negative for chest pain, palpitations, orthopnea and claudication.  Gastrointestinal: Negative for abdominal pain, blood in stool, constipation, diarrhea, heartburn, melena, nausea and vomiting.  Genitourinary: Negative for dysuria, flank pain, frequency, hematuria and urgency.  Musculoskeletal: Negative for back pain, joint pain and myalgias.  Skin: Negative for rash.  Neurological: Negative for dizziness, tingling, focal weakness, seizures, weakness and headaches.  Endo/Heme/Allergies: Does not bruise/bleed easily.  Psychiatric/Behavioral: Negative for depression and suicidal ideas. The patient does not have insomnia.      No Known Allergies   Past Medical History:  Diagnosis Date  . Cancer ([place])    skin cancer-nose w graft,also shoulder- basal cell per pt  . Cataract    r eye  . Chronic kidney disease    kidney stones 20 y ago per pt  . Colon cancer ([place])   .  Diabetes mellitus without complication ([place])   . Eczema   . History of  kidney stones   . Hyperlipidemia   . Hypertension      Past Surgical History:  Procedure Laterality Date  . COLECTOMY WITH COLOSTOMY CREATION/HARTMANN PROCEDURE N/A 02/06/2017   Procedure: COLECTOMY WITH COLOSTOMY CREATION/HARTMANN PROCEDURE;  Surgeon: [name], MD;  Location: [facility] ORS;  Service: General;  Laterality: N/A;  . COLONOSCOPY WITH PROPOFOL N/A 09/04/2017   Procedure: COLONOSCOPY WITH PROPOFOL;  Surgeon: Jonathon Bellows, MD;  Location[facility]OSCOPY;  Service: Gastroenterology;  Laterality: N/A;  . COLOSTOMY CLOSURE N/A 10/20/2017   Procedure: COLOSTOMY CLOSURE;  Surgeon: [name], MD;  Location: [facility] ORS;  Service: General;  Laterality: N/A;  . CYSTOSCOPY    . FLEXIBLE SIGMOIDOSCOPY N/A 02/06/2017   Procedure: FLEXIBLE SIGMOIDOSCOPY;  Surgeon: Christene Lye, MD;  Location: [facility] ENDOSCOPY;  Service: Endoscopy;  Laterality: N/A;    Social History   Socioeconomic History  . Marital status: Married    Spouse name: Not on file  . Number of children: Not on file  . Years of education: Not on file  . Highest education level: Not on file  Occupational History  . Not on file  Social Needs  . Financial resource strain: Not on file  . Food insecurity:    Worry: Not on file    Inability: Not on file  . Transportation needs:    Medical: Not on file    Non-medical: Not on file  Tobacco Use  . Smoking status: Never Smoker  . Smokeless tobacco: Never Used  Substance and Sexual Activity  . Alcohol use: No  . Drug use: No  . Sexual activity: Not Currently  Lifestyle  . Physical activity:    Days per week: Not on file    Minutes per session: Not on file  . Stress: Not on file  Relationships  . Social connections:    Talks on phone: Not on file    Gets together: Not on file    Attends religious service: Not on file    Active member of club or organization: Not on file    Attends meetings of clubs or organizations: Not on file    Relationship status:  Not on file  . Intimate partner violence:    Fear of current or ex partner: Not on file    Emotionally abused: Not on file    Physically abused: Not on file    Forced sexual activity: Not on file  Other Topics Concern  . Not on file  Social History Narrative  . Not on file    Family History  Problem Relation Age of Onset  . Diabetes Mother   . AAA (abdominal aortic aneurysm) Mother   . Coronary artery disease Mother   . Diabetes Father   . Coronary artery disease Father   . Dementia Father   . Breast cancer Sister      Current Outpatient Medications:  .  Apremilast (OTEZLA) 30 MG TABS, Take 30 mg by mouth daily. , Disp: , Rfl:  .  aspirin EC 81 MG tablet, Take 81 mg by mouth daily., Disp: , Rfl:  .  glimepiride (AMARYL) 2 MG tablet, Take 2 mg by mouth daily., Disp: , Rfl:  .  loratadine (CLARITIN) 10 MG tablet, Take 10 mg by mouth daily., Disp: , Rfl:  .  losartan (COZAAR) 25 MG tablet, Take 25 mg by mouth daily., Disp: , Rfl:  .  metFORMIN (  GLUCOPHAGE) 1000 MG tablet, Take 1,000-1,500 tablets by mouth See admin instructions. Take 1000 mg by mouth in the morning and take 1500 mg by mouth at supper, Disp: , Rfl:  .  pravastatin (PRAVACHOL) 20 MG tablet, Take 20 mg by mouth daily., Disp: , Rfl:  .  triamcinolone (KENALOG) 0.025 % cream, Apply 1 application topically daily., Disp: , Rfl:  .  ferrous sulfate 324 (65 Fe) MG TBEC, Take 324 mg by mouth daily., Disp: , Rfl:  .  naproxen sodium (ALEVE) 220 MG tablet, Take 220 mg by mouth 2 (two) times daily as needed (for pain or headache). , Disp: , Rfl:   Physical exam:  Vitals:   04/16/18 1039  BP: (!) 144/80  Pulse: 69  Resp: 18  Temp: 97.6 F (36.4 C)  TempSrc: Tympanic  SpO2: 96%  Weight: 193 lb 12.8 oz (87.9 kg)  Height: _0  (1.676 m)   Physical Exam  Constitutional: He is oriented to person, place, and time.  HENT:  Head: Normocephalic and atraumatic.  Eyes: Pupils are equal, round, and reactive to light. EOM are  normal.  Neck: Normal range of motion.  Cardiovascular: Normal rate, regular rhythm and normal heart sounds.  Pulmonary/Chest: Effort normal and breath sounds normal.  Abdominal: Soft. Bowel sounds are normal.  Neurological: He is alert and oriented to person, place, and time.  Skin: Skin is warm and dry.     CMP Latest Ref Rng & Units 03/16/2018  Glucose 65 - 99 mg/dL 219(H)  BUN 6 - 20 mg/dL 18  Creatinine 0.61 - 1.24 mg/dL 1.18  Sodium 135 - 145 mmol/L 137  Potassium 3.5 - 5.1 mmol/L 4.2  Chloride 101 - 111 mmol/L 104  CO2 22 - 32 mmol/L 23  Calcium 8.9 - 10.3 mg/dL 9.2  Total Protein 6.5 - 8.1 g/dL 7.3  Total Bilirubin 0.3 - 1.2 mg/dL 0.6  Alkaline Phos 38 - 126 U/L 59  AST 15 - 41 U/L 25  ALT 17 - 63 U/L 18   CBC Latest Ref Rng & Units 04/01/2018  WBC 3.8 - 10.6 K/uL 8.2  Hemoglobin 13.0 - 18.0 g/dL 14.3  Hematocrit 40.0 - 52.0 % 42.6  Platelets 150 - 440 K/uL 279    No images are attached to the encounter.  Ct Biopsy  Result Date: 04/01/2018 CLINICAL DATA:  History of colon carcinoma, resected. Enlarging pulmonary nodules suggesting metastatic disease. EXAM: CT GUIDED CORE BIOPSY OF LINGULAR NODULE ANESTHESIA/SEDATION: Intravenous Fentanyl and Versed were administered as conscious sedation during continuous monitoring of the patient's level of consciousness and physiological / cardiorespiratory status by the radiology RN, with a total moderate sedation time of 23 minutes. PROCEDURE: The procedure risks, benefits, and alternatives were explained to the patient. Questions regarding the procedure were encouraged and answered. The patient understands and consents to the procedure. Patient placed in left lateral decubitus position. Select axial scans through the thorax obtained and the dominant lingular nodule was localized. An appropriate skin entry site was determined and marked. The operative field was prepped with chlorhexidinein a sterile fashion, and a sterile drape was applied  covering the operative field. A sterile gown and sterile gloves were used for the procedure. Local anesthesia was provided with 1% Lidocaine. Under CT fluoroscopic guidance, a 17 gauge trocar needle was advanced to the margin of the lesion. Once needle tip position was confirmed, coaxial 18-gauge core biopsy samples were obtained, submitted in formalin to surgical pathology. The guide needle was removed. Postprocedure scans  show regional alveolar hemorrhage. No pneumothorax. The patient tolerated the procedure well. COMPLICATIONS: None immediate FINDINGS: Lingular and left upper lobe nodules were again localized. Representative core biopsies of the lingular nodule obtained as above. IMPRESSION: 1. Technically successful CT-guided core biopsy, lingular nodule. Observation and follow-up chest radiography is scheduled. Electronically Signed   By: Lucrezia Europe M.D.   On: 04/01/2018 10:49   Dg Chest Port 1 View  Result Date: 04/01/2018 CLINICAL DATA:  Status post biopsy EXAM: PORTABLE CHEST - 1 VIEW COMPARISON:  02/05/2017 FINDINGS: Airspace disease in the lingula probably regional alveolar hemorrhage from recent biopsy. No pneumothorax. Right lung clear. Heart size and mediastinal contours are within normal limits. No effusion. Visualized bones unremarkable. IMPRESSION: 1. No pneumothorax post lingular lung nodule biopsy. Electronically Signed   By: Lucrezia Europe M.D.   On: 04/01/2018 10:55     Assessment and plan- Patient is a 77 y.o. male with Stage IV colon adenocarcinoma with lung metastases  Biopsy of the lung nodule confirms colon adenocarcinoma. Patient therefore had metastatic disease at presentation itself but was too small to characterize at that point. His lung nodules have grown slowly over the last 1 year. He is asymptomatic. No other evidence of metastatic disease other than lungs.  Given the relative slow growth of tumor over a year and despite being off chemotherapy for over 6 months now, plan is  to restart chemotherapy after summer. Patient usually likes to go to beachoften and enjoy his summers  I recommend starting palliative chemotherapy with FOLFOX/ avastin Q2 weeks atleast for 6 months. Depending on his response, we could potentially switch him to xeloda after 6 months +/- avastin since he tolerated xeloda well in the past. I explained to the patient the risks and benefits of chemotherapy including all but not limited to nausea, vomiting, low blood counts and risk of infection and hospitalization. Risk of peripehral neuropathy associated with oxaliplatin. Patient understands and agrees to proceed as planned. Chemotherapy will be given with a palliative intent  Discussed risks and benefits of avastin including all but not limited to risk of strokes and thromboembolic events. Patient has no prior h/o MI or strokes. His DM and HTN are well controlled. Patient understands and agrees to proceed  We will plan to get repeat scans, port placement and chemo teach early august and tentatively start chemotherapy mid august 2019.Patient baseline CEA has never beeen elevated.  I will see him back mid august with cbc/cmp CEA to start cycle 1 of FOLFOX/ avastin chemotherapy   Total face to face encounter time for this patient visit was 45 min. >50% of the time was  spent in counseling and coordination of care.      Visit Diagnosis 1. 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[place])   2. Malignant neoplasm metastatic to lung, unspecified laterality ([place])   3. Goals of care, counseling/discussion      Dr. Randa Evens, MD, MPH [facility] [phone] 04/19/2018 8:12 AM

## 2018-05-26 ENCOUNTER — Telehealth: Payer: Self-pay | Admitting: Surgery

## 2018-05-26 ENCOUNTER — Telehealth: Payer: Self-pay | Admitting: *Deleted

## 2018-05-26 NOTE — Telephone Encounter (Signed)
-----   Message from Wilburn Cornelia sent at 05/26/2018  3:13 PM EDT ----- Regarding: callback please Contact: [phone] Pt called and asked for you.

## 2018-05-26 NOTE — Telephone Encounter (Signed)
Sherrie with the cancer center is calling again about the patient asking for Dr. Burt Knack to see the patient the first week in August. She can be reached at phone number [phone]. Please call and advise.

## 2018-05-26 NOTE — Telephone Encounter (Signed)
Called pt back and I have set up his appt to see Dr. Burt Knack 8/1 at 9 am in [place]. Pt. Is agreeable.  I then started going over me sending a message to scheduling for chemo educations, scans, lab and then see md with chemo. We cut off with telephone. I called him back and got his voicemail. Will try again and I left a message on his cell phone  To above.

## 2018-05-26 NOTE — Telephone Encounter (Signed)
Called Judeen Hammans to let her know that we will see the patient on 06/10/2018 and that Dr. Burt Knack could potentially place his port-a-cath on 06/11/2018 or 06/16/2018. Judeen Hammans was pleased since he will start his chemo on 06/22/2018.

## 2018-05-28 ENCOUNTER — Other Ambulatory Visit: Payer: Self-pay | Admitting: *Deleted

## 2018-05-28 ENCOUNTER — Telehealth: Payer: Self-pay | Admitting: *Deleted

## 2018-05-28 NOTE — Telephone Encounter (Signed)
Spoke to pt by voicemail yest. And called back today and confirmed that pt agreeable to 8/5 chemo class, 8/9 labs and ct scan and then 8/13 to see md and start treatment. All dates and times he put on his calendar. I will mail him copy of schedule. Went over the NPO x 4 hours and picking up prep kit for scan. Also told pt that surgeon when he sees him will set up date for port but it probably would be 8/2 or 8/7 per Miritza at the surgery office. He is agreeable to above. He also has labs order for PCP the next week after chemo and wants to know if it can be done at same time here. I told him I would check with Dr. Carlean Jews and let him know.

## 2018-06-10 ENCOUNTER — Ambulatory Visit (INDEPENDENT_AMBULATORY_CARE_PROVIDER_SITE_OTHER): Payer: Medicare Other | Admitting: Surgery

## 2018-06-10 ENCOUNTER — Encounter: Payer: Self-pay | Admitting: Surgery

## 2018-06-10 VITALS — BP 163/77 | HR 76 | Temp 97.5°F | Ht 66.0 in | Wt 194.6 lb

## 2018-06-10 DIAGNOSIS — C186 Malignant neoplasm of descending colon: Secondary | ICD-10-CM | POA: Diagnosis not present

## 2018-06-10 NOTE — Progress Notes (Signed)
Outpatient Surgical Follow Up  06/10/2018  Miguel Flores is an 77 y.o. male.   CC: Pulmonary metastasis  HPI: This patient with a obstructing colon cancer that underwent a Hartman's procedure followed by colostomy closure and now has signs of an enlarging pulmonary nodule which is been biopsied consistent with colonic source representing metastatic disease.  Port placement has been requested.  Past Medical History:  Diagnosis Date  . Cancer ([place])    skin cancer-nose w graft,also shoulder- basal cell per pt  . Cataract    r eye  . Chronic kidney disease    kidney stones 20 y ago per pt  . Colon cancer ([place])   . Diabetes mellitus without complication ([place])   . Eczema   . History of kidney stones   . Hyperlipidemia   . Hypertension     Past Surgical History:  Procedure Laterality Date  . COLECTOMY WITH COLOSTOMY CREATION/HARTMANN PROCEDURE N/A 02/06/2017   Procedure: COLECTOMY WITH COLOSTOMY CREATION/HARTMANN PROCEDURE;  Surgeon: [name], MD;  Location: [facility] ORS;  Service: General;  Laterality: N/A;  . COLONOSCOPY WITH PROPOFOL N/A 09/04/2017   Procedure: COLONOSCOPY WITH PROPOFOL;  Surgeon: Jonathon Bellows, MD;  Location: [facility] ENDOSCOPY;  Service: Gastroenterology;  Laterality: N/A;  . COLOSTOMY CLOSURE N/A 10/20/2017   Procedure: COLOSTOMY CLOSURE;  Surgeon: [name], MD;  Location: [facility] ORS;  Service: General;  Laterality: N/A;  . CYSTOSCOPY    . FLEXIBLE SIGMOIDOSCOPY N/A 02/06/2017   Procedure: FLEXIBLE SIGMOIDOSCOPY;  Surgeon: Christene Lye, MD;  Location: [facility] ENDOSCOPY;  Service: Endoscopy;  Laterality: N/A;    Family History  Problem Relation Age of Onset  . Diabetes Mother   . AAA (abdominal aortic aneurysm) Mother   . Coronary artery disease Mother   . Diabetes Father   . Coronary artery disease Father   . Dementia Father   . Breast cancer Sister     Social History:  reports that he has never smoked. He has never used smokeless tobacco.  He reports that he does not drink alcohol or use drugs.  Allergies: No Known Allergies  Medications reviewed.   Review of Systems:   Review of Systems  Constitutional: Negative.   HENT: Negative.   Eyes: Negative.   Respiratory: Negative.   Cardiovascular: Negative.   Gastrointestinal: Negative.   Genitourinary: Negative.   Musculoskeletal: Negative.   Skin: Negative.   Neurological: Negative.   Endo/Heme/Allergies: Negative.   Psychiatric/Behavioral: Negative.      Physical Exam:  There were no vitals taken for this visit.  Physical Exam  Constitutional: He is oriented to person, place, and time. He appears well-developed and well-nourished. No distress.  HENT:  Head: Normocephalic and atraumatic.  Eyes: Pupils are equal, round, and reactive to light. Right eye exhibits no discharge. Left eye exhibits no discharge. No scleral icterus.  Neck: Normal range of motion. Neck supple.  Cardiovascular: Normal rate, regular rhythm and normal heart sounds.  Pulmonary/Chest: Effort normal and breath sounds normal. No stridor. No respiratory distress. He has no wheezes.  Abdominal: Soft. He exhibits no distension. There is no tenderness. There is no guarding.  Musculoskeletal: Normal range of motion. He exhibits no edema.  Neurological: He is alert and oriented to person, place, and time.  Skin: Skin is warm and dry. He is not diaphoretic. No erythema.  Vitals reviewed.     No results found for this or any previous visit (from the past 48 hour(s)). No results found.  Assessment/Plan:  This patient the requires port placement.  This is for colons cancer metastatic to lung.  The rationale for this is been discussed the options of observation of been reviewed and the risk of bleeding infection pneumothorax hemopneumothorax thrombosis and nonfunction all which were reviewed.  He understood and agreed with this plan.  We also discussed the potential for requirement of the chest  tube should he is lung collapse.  [name], MD, FACS

## 2018-06-10 NOTE — Patient Instructions (Signed)
We have seen you today and have spoken about your port placement. This has been scheduled for  at [facility] by Dr. Phoebe Perch.  Please see the [facility]) Sheet provided for further details.  Please call our office with any questions or concerns that you have.   Port-a-Cath [facility]) A central line is a soft, flexible tube (catheter) that can be used to collect blood for testing or to give medicine or nutrition through a vein. The tip of the central line ends in a large vein just above the heart called the vena cava. A central line may be placed because:  You need to get medicines or fluids through an IV tube for a long period of time.  You need nutrition but cannot eat or absorb nutrients.  The veins in your hands or arms are hard to access.  You need to have blood taken often for blood tests.  You need a blood transfusion  You need chemotherapy or dialysis.  There are many types of central lines:  Peripherally inserted central catheter (PICC) line. This type is used for intermediate access to long-term access of one week or more. It can be used to draw blood and give fluids or medicines. A PICC looks like an IV tube, but it goes up the arm to the heart. It is usually inserted in the upper arm and taped in place on the arm.  Tunneled central line. This type is used for long-term therapy and dialysis. It is placed in a large vein in the neck, chest, or groin. A tunneled central line is inserted through a small incision made over the vein and is advanced into the heart. It is tunneled beneath the skin and brought out through a second incision.  Non-tunneled central line. This type is used for short-term access, usually of a maximum of 7 days. It is often used in the emergency department. A non-tunneled central line is inserted in the neck, chest, or groin.  Implanted port. This type is used for long-term therapy. It can stay in place longer than other types of central lines. An  implanted port is normally inserted in the upper chest but can also be placed in the upper arm or in the abdomen. It is inserted and removed with surgery, and it is accessed using a special needle.  The type of central line that you receive depends on how long you will need it, your medical condition, and the condition of your veins. What are the risks? Using any type of central line has risks that you should be aware of, including:  Infection.  A blood clot that blocks the central line or forms in the vein and travels to the heart.  Bleeding from the place where the central line was put in.  Developing a hole or crack within the central line. If this happens, the central line will need to be replaced.  Developing an abnormal heart rhythm (arrhythmia). This is rare.  Central line failure.  Follow these instructions at home: Flushing and cleaning the central line  Follow instructions from the health care provider about flushing and cleaning the central line.  Wear a mask when flushing or cleaning the central line.  Before you flush or clean the central line: ? Wash your hands with soap and water. ? Clean the central line hub with rubbing alcohol. Insertion site care  Keep the insertion site of your central line clean and dry at all times.  Check your incision or  central line site every day for signs of infection. Check for: ? More redness, swelling, or pain. ? More fluid or blood. ? Warmth. ? Pus or a bad smell. General instructions  Follow instructions from your health care provider for the type of device that you have.  If the central line accidentally gets pulled on, make sure: ? The bandage (dressing) is okay. ? There is no bleeding. ? The line has not been pulled out.  Return to your normal activities as told by your health care provider. Ask your health care provider what activities are safe for you. You may be restricted from lifting or making repetitive arm  movements on the side with the catheter.  Do not swim or bathe unless your health care provider approves.  Keep your dressing dry. Your health care provider can instruct you about how to keep your specific type of dressing from getting wet.  Keep all follow-up visits as told by your health care provider. This is important. Contact a health care provider if:  You have more redness, swelling, or pain around your incision.  You have more fluid or blood coming from your incision.  Your incision feels warm to the touch.  You have pus or a bad smell coming from your incision. Get help right away if:  You have: ? Chills. ? A fever. ? Shortness of breath. ? Trouble breathing. ? Chest pain. ? Swelling in your neck, face, chest, or arm on the side of your central line.  You are coughing.  You feel your heart beating rapidly or skipping beats.  You feel dizzy or you faint.  Your incision or central line site has red streaks spreading away from the area.  Your incision or central line site is bleeding and does not stop.  Your central line is difficult to flush or will not flush.  You do not get a blood return from the central line.  Your central line gets loose or comes out.  Your central line gets damaged.  Your catheter leaks when flushed or when fluids are infused into it. This information is not intended to replace advice given to you by your health care provider. Make sure you discuss any questions you have with your health care provider. Document Released: 12/18/2005 Document Revised: 06/25/2016 Document Reviewed: 06/04/2016 Elsevier Interactive Patient Education  2017 [name].

## 2018-06-11 ENCOUNTER — Telehealth: Payer: Self-pay | Admitting: Surgery

## 2018-06-11 NOTE — Telephone Encounter (Signed)
Pt advised of pre op date/time and sx date. Sx: 06/17/18 with Dr Francesco Runner placement with fluoroscopy. Pre op: 06/15/18 @ 11:15am--office interview.   Patient made aware to call [phone], between 1-3:00pm the day before surgery, to find out what time to arrive.

## 2018-06-11 NOTE — Telephone Encounter (Signed)
Patient would like a nurse to call back - he has questions on his port placement

## 2018-06-11 NOTE — Telephone Encounter (Signed)
Patient wanted to know if he could sleep on his left side after his port placement and was told yes.

## 2018-06-11 NOTE — Patient Instructions (Signed)
Bevacizumab injection What is this medicine? BEVACIZUMAB (be va SIZ yoo mab) is a monoclonal antibody. It is used to treat many types of cancer. This medicine may be used for other purposes; ask your health care provider or pharmacist if you have questions. COMMON BRAND NAME(S): Avastin What should I tell my health care provider before I take this medicine? They need to know if you have any of these conditions: -diabetes -heart disease -high blood pressure -history of coughing up blood -prior anthracycline chemotherapy (e.g., doxorubicin, daunorubicin, epirubicin) -recent or ongoing radiation therapy -recent or planning to have surgery -stroke -an unusual or allergic reaction to bevacizumab, hamster proteins, mouse proteins, other medicines, foods, dyes, or preservatives -pregnant or trying to get pregnant -breast-feeding How should I use this medicine? This medicine is for infusion into a vein. It is given by a health care professional in a hospital or clinic setting. Talk to your pediatrician regarding the use of this medicine in children. Special care may be needed. Overdosage: If you think you have taken too much of this medicine contact a poison control center or emergency room at once. NOTE: This medicine is only for you. Do not share this medicine with others. What if I miss a dose? It is important not to miss your dose. Call your doctor or health care professional if you are unable to keep an appointment. What may interact with this medicine? Interactions are not expected. This list may not describe all possible interactions. Give your health care provider a list of all the medicines, herbs, non-prescription drugs, or dietary supplements you use. Also tell them if you smoke, drink alcohol, or use illegal drugs. Some items may interact with your medicine. What should I watch for while using this medicine? Your condition will be monitored carefully while you are receiving this  medicine. You will need important blood work and urine testing done while you are taking this medicine. This medicine may increase your risk to bruise or bleed. Call your doctor or health care professional if you notice any unusual bleeding. This medicine should be started at least 28 days following major surgery and the site of the surgery should be totally healed. Check with your doctor before scheduling dental work or surgery while you are receiving this treatment. Talk to your doctor if you have recently had surgery or if you have a wound that has not healed. Do not become pregnant while taking this medicine or for 6 months after stopping it. Women should inform their doctor if they wish to become pregnant or think they might be pregnant. There is a potential for serious side effects to an unborn child. Talk to your health care professional or pharmacist for more information. Do not breast-feed an infant while taking this medicine and for 6 months after the last dose. This medicine has caused ovarian failure in some women. This medicine may interfere with the ability to have a child. You should talk to your doctor or health care professional if you are concerned about your fertility. What side effects may I notice from receiving this medicine? Side effects that you should report to your doctor or health care professional as soon as possible: -allergic reactions like skin rash, itching or hives, swelling of the face, lips, or tongue -chest pain or chest tightness -chills -coughing up blood -high fever -seizures -severe constipation -signs and symptoms of bleeding such as bloody or black, tarry stools; red or dark-brown urine; spitting up blood or brown material that looks   like coffee grounds; red spots on the skin; unusual bruising or bleeding from the eye, gums, or nose -signs and symptoms of a blood clot such as breathing problems; chest pain; severe, sudden headache; pain, swelling, warmth in  the leg -signs and symptoms of a stroke like changes in vision; confusion; trouble speaking or understanding; severe headaches; sudden numbness or weakness of the face, arm or leg; trouble walking; dizziness; loss of balance or coordination -stomach pain -sweating -swelling of legs or ankles -vomiting -weight gain Side effects that usually do not require medical attention (report to your doctor or health care professional if they continue or are bothersome): -back pain -changes in taste -decreased appetite -dry skin -nausea -tiredness This list may not describe all possible side effects. Call your doctor for medical advice about side effects. You may report side effects to FDA at 1-800-FDA-1088. Where should I keep my medicine? This drug is given in a hospital or clinic and will not be stored at home. NOTE: This sheet is a summary. It may not cover all possible information. If you have questions about this medicine, talk to your doctor, pharmacist, or health care provider.  2018 Elsevier/Gold Standard (2016-10-24 14:33:29) Oxaliplatin Injection What is this medicine? OXALIPLATIN (ox AL i PLA tin) is a chemotherapy drug. It targets fast dividing cells, like cancer cells, and causes these cells to die. This medicine is used to treat cancers of the colon and rectum, and many other cancers. This medicine may be used for other purposes; ask your health care provider or pharmacist if you have questions. COMMON BRAND NAME(S): Eloxatin What should I tell my health care provider before I take this medicine? They need to know if you have any of these conditions: -kidney disease -an unusual or allergic reaction to oxaliplatin, other chemotherapy, other medicines, foods, dyes, or preservatives -pregnant or trying to get pregnant -breast-feeding How should I use this medicine? This drug is given as an infusion into a vein. It is administered in a hospital or clinic by a specially trained health  care professional. Talk to your pediatrician regarding the use of this medicine in children. Special care may be needed. Overdosage: If you think you have taken too much of this medicine contact a poison control center or emergency room at once. NOTE: This medicine is only for you. Do not share this medicine with others. What if I miss a dose? It is important not to miss a dose. Call your doctor or health care professional if you are unable to keep an appointment. What may interact with this medicine? -medicines to increase blood counts like filgrastim, pegfilgrastim, sargramostim -probenecid -some antibiotics like amikacin, gentamicin, neomycin, polymyxin B, streptomycin, tobramycin -zalcitabine Talk to your doctor or health care professional before taking any of these medicines: -acetaminophen -aspirin -ibuprofen -ketoprofen -naproxen This list may not describe all possible interactions. Give your health care provider a list of all the medicines, herbs, non-prescription drugs, or dietary supplements you use. Also tell them if you smoke, drink alcohol, or use illegal drugs. Some items may interact with your medicine. What should I watch for while using this medicine? Your condition will be monitored carefully while you are receiving this medicine. You will need important blood work done while you are taking this medicine. This medicine can make you more sensitive to cold. Do not drink cold drinks or use ice. Cover exposed skin before coming in contact with cold temperatures or cold objects. When out in cold weather wear warm clothing  and cover your mouth and nose to warm the air that goes into your lungs. Tell your doctor if you get sensitive to the cold. This drug may make you feel generally unwell. This is not uncommon, as chemotherapy can affect healthy cells as well as cancer cells. Report any side effects. Continue your course of treatment even though you feel ill unless your doctor tells  you to stop. In some cases, you may be given additional medicines to help with side effects. Follow all directions for their use. Call your doctor or health care professional for advice if you get a fever, chills or sore throat, or other symptoms of a cold or flu. Do not treat yourself. This drug decreases your body's ability to fight infections. Try to avoid being around people who are sick. This medicine may increase your risk to bruise or bleed. Call your doctor or health care professional if you notice any unusual bleeding. Be careful brushing and flossing your teeth or using a toothpick because you may get an infection or bleed more easily. If you have any dental work done, tell your dentist you are receiving this medicine. Avoid taking products that contain aspirin, acetaminophen, ibuprofen, naproxen, or ketoprofen unless instructed by your doctor. These medicines may hide a fever. Do not become pregnant while taking this medicine. Women should inform their doctor if they wish to become pregnant or think they might be pregnant. There is a potential for serious side effects to an unborn child. Talk to your health care professional or pharmacist for more information. Do not breast-feed an infant while taking this medicine. Call your doctor or health care professional if you get diarrhea. Do not treat yourself. What side effects may I notice from receiving this medicine? Side effects that you should report to your doctor or health care professional as soon as possible: -allergic reactions like skin rash, itching or hives, swelling of the face, lips, or tongue -low blood counts - This drug may decrease the number of white blood cells, red blood cells and platelets. You may be at increased risk for infections and bleeding. -signs of infection - fever or chills, cough, sore throat, pain or difficulty passing urine -signs of decreased platelets or bleeding - bruising, pinpoint red spots on the skin,  black, tarry stools, nosebleeds -signs of decreased red blood cells - unusually weak or tired, fainting spells, lightheadedness -breathing problems -chest pain, pressure -cough -diarrhea -jaw tightness -mouth sores -nausea and vomiting -pain, swelling, redness or irritation at the injection site -pain, tingling, numbness in the hands or feet -problems with balance, talking, walking -redness, blistering, peeling or loosening of the skin, including inside the mouth -trouble passing urine or change in the amount of urine Side effects that usually do not require medical attention (report to your doctor or health care professional if they continue or are bothersome): -changes in vision -constipation -hair loss -loss of appetite -metallic taste in the mouth or changes in taste -stomach pain This list may not describe all possible side effects. Call your doctor for medical advice about side effects. You may report side effects to FDA at 1-800-FDA-1088. Where should I keep my medicine? This drug is given in a hospital or clinic and will not be stored at home. NOTE: This sheet is a summary. It may not cover all possible information. If you have questions about this medicine, talk to your doctor, pharmacist, or health care provider.  2018 Elsevier/Gold Standard (2008-05-23 17:22:47) Fluorouracil, 5-FU injection What is  this medicine? FLUOROURACIL, 5-FU (flure oh YOOR a sil) is a chemotherapy drug. It slows the growth of cancer cells. This medicine is used to treat many types of cancer like breast cancer, colon or rectal cancer, pancreatic cancer, and stomach cancer. This medicine may be used for other purposes; ask your health care provider or pharmacist if you have questions. COMMON BRAND NAME(S): Adrucil What should I tell my health care provider before I take this medicine? They need to know if you have any of these conditions: -blood disorders -dihydropyrimidine dehydrogenase (DPD)  deficiency -infection (especially a virus infection such as chickenpox, cold sores, or herpes) -kidney disease -liver disease -malnourished, poor nutrition -recent or ongoing radiation therapy -an unusual or allergic reaction to fluorouracil, other chemotherapy, other medicines, foods, dyes, or preservatives -pregnant or trying to get pregnant -breast-feeding How should I use this medicine? This drug is given as an infusion or injection into a vein. It is administered in a hospital or clinic by a specially trained health care professional. Talk to your pediatrician regarding the use of this medicine in children. Special care may be needed. Overdosage: If you think you have taken too much of this medicine contact a poison control center or emergency room at once. NOTE: This medicine is only for you. Do not share this medicine with others. What if I miss a dose? It is important not to miss your dose. Call your doctor or health care professional if you are unable to keep an appointment. What may interact with this medicine? -allopurinol -cimetidine -dapsone -digoxin -hydroxyurea -leucovorin -levamisole -medicines for seizures like ethotoin, fosphenytoin, phenytoin -medicines to increase blood counts like filgrastim, pegfilgrastim, sargramostim -medicines that treat or prevent blood clots like warfarin, enoxaparin, and dalteparin -methotrexate -metronidazole -pyrimethamine -some other chemotherapy drugs like busulfan, cisplatin, estramustine, vinblastine -trimethoprim -trimetrexate -vaccines Talk to your doctor or health care professional before taking any of these medicines: -acetaminophen -aspirin -ibuprofen -ketoprofen -naproxen This list may not describe all possible interactions. Give your health care provider a list of all the medicines, herbs, non-prescription drugs, or dietary supplements you use. Also tell them if you smoke, drink alcohol, or use illegal drugs. Some items  may interact with your medicine. What should I watch for while using this medicine? Visit your doctor for checks on your progress. This drug may make you feel generally unwell. This is not uncommon, as chemotherapy can affect healthy cells as well as cancer cells. Report any side effects. Continue your course of treatment even though you feel ill unless your doctor tells you to stop. In some cases, you may be given additional medicines to help with side effects. Follow all directions for their use. Call your doctor or health care professional for advice if you get a fever, chills or sore throat, or other symptoms of a cold or flu. Do not treat yourself. This drug decreases your body's ability to fight infections. Try to avoid being around people who are sick. This medicine may increase your risk to bruise or bleed. Call your doctor or health care professional if you notice any unusual bleeding. Be careful brushing and flossing your teeth or using a toothpick because you may get an infection or bleed more easily. If you have any dental work done, tell your dentist you are receiving this medicine. Avoid taking products that contain aspirin, acetaminophen, ibuprofen, naproxen, or ketoprofen unless instructed by your doctor. These medicines may hide a fever. Do not become pregnant while taking this medicine. Women should inform  their doctor if they wish to become pregnant or think they might be pregnant. There is a potential for serious side effects to an unborn child. Talk to your health care professional or pharmacist for more information. Do not breast-feed an infant while taking this medicine. Men should inform their doctor if they wish to father a child. This medicine may lower sperm counts. Do not treat diarrhea with over the counter products. Contact your doctor if you have diarrhea that lasts more than 2 days or if it is severe and watery. This medicine can make you more sensitive to the sun. Keep out  of the sun. If you cannot avoid being in the sun, wear protective clothing and use sunscreen. Do not use sun lamps or tanning beds/booths. What side effects may I notice from receiving this medicine? Side effects that you should report to your doctor or health care professional as soon as possible: -allergic reactions like skin rash, itching or hives, swelling of the face, lips, or tongue -low blood counts - this medicine may decrease the number of white blood cells, red blood cells and platelets. You may be at increased risk for infections and bleeding. -signs of infection - fever or chills, cough, sore throat, pain or difficulty passing urine -signs of decreased platelets or bleeding - bruising, pinpoint red spots on the skin, black, tarry stools, blood in the urine -signs of decreased red blood cells - unusually weak or tired, fainting spells, lightheadedness -breathing problems -changes in vision -chest pain -mouth sores -nausea and vomiting -pain, swelling, redness at site where injected -pain, tingling, numbness in the hands or feet -redness, swelling, or sores on hands or feet -stomach pain -unusual bleeding Side effects that usually do not require medical attention (report to your doctor or health care professional if they continue or are bothersome): -changes in finger or toe nails -diarrhea -dry or itchy skin -hair loss -headache -loss of appetite -sensitivity of eyes to the light -stomach upset -unusually teary eyes This list may not describe all possible side effects. Call your doctor for medical advice about side effects. You may report side effects to FDA at 1-800-FDA-1088. Where should I keep my medicine? This drug is given in a hospital or clinic and will not be stored at home. NOTE: This sheet is a summary. It may not cover all possible information. If you have questions about this medicine, talk to your doctor, pharmacist, or health care provider.  2018  Elsevier/Gold Standard (2008-03-01 13:53:16) Leucovorin injection What is this medicine? LEUCOVORIN (loo koe VOR in) is used to prevent or treat the harmful effects of some medicines. This medicine is used to treat anemia caused by a low amount of folic acid in the body. It is also used with 5-fluorouracil (5-FU) to treat colon cancer. This medicine may be used for other purposes; ask your health care provider or pharmacist if you have questions. What should I tell my health care provider before I take this medicine? They need to know if you have any of these conditions: -anemia from low levels of vitamin B-12 in the blood -an unusual or allergic reaction to leucovorin, folic acid, other medicines, foods, dyes, or preservatives -pregnant or trying to get pregnant -breast-feeding How should I use this medicine? This medicine is for injection into a muscle or into a vein. It is given by a health care professional in a hospital or clinic setting. Talk to your pediatrician regarding the use of this medicine in children. Special care  may be needed. Overdosage: If you think you have taken too much of this medicine contact a poison control center or emergency room at once. NOTE: This medicine is only for you. Do not share this medicine with others. What if I miss a dose? This does not apply. What may interact with this medicine? -capecitabine -fluorouracil -phenobarbital -phenytoin -primidone -trimethoprim-sulfamethoxazole This list may not describe all possible interactions. Give your health care provider a list of all the medicines, herbs, non-prescription drugs, or dietary supplements you use. Also tell them if you smoke, drink alcohol, or use illegal drugs. Some items may interact with your medicine. What should I watch for while using this medicine? Your condition will be monitored carefully while you are receiving this medicine. This medicine may increase the side effects of  5-fluorouracil, 5-FU. Tell your doctor or health care professional if you have diarrhea or mouth sores that do not get better or that get worse. What side effects may I notice from receiving this medicine? Side effects that you should report to your doctor or health care professional as soon as possible: -allergic reactions like skin rash, itching or hives, swelling of the face, lips, or tongue -breathing problems -fever, infection -mouth sores -unusual bleeding or bruising -unusually weak or tired Side effects that usually do not require medical attention (report to your doctor or health care professional if they continue or are bothersome): -constipation or diarrhea -loss of appetite -nausea, vomiting This list may not describe all possible side effects. Call your doctor for medical advice about side effects. You may report side effects to FDA at 1-800-FDA-1088. Where should I keep my medicine? This drug is given in a hospital or clinic and will not be stored at home. NOTE: This sheet is a summary. It may not cover all possible information. If you have questions about this medicine, talk to your doctor, pharmacist, or health care provider.  2018 Elsevier/Gold Standard (2008-05-02 16:50:29)

## 2018-06-14 ENCOUNTER — Inpatient Hospital Stay: Payer: Medicare Other | Attending: Oncology

## 2018-06-14 DIAGNOSIS — R809 Proteinuria, unspecified: Secondary | ICD-10-CM | POA: Insufficient documentation

## 2018-06-14 DIAGNOSIS — C7800 Secondary malignant neoplasm of unspecified lung: Secondary | ICD-10-CM | POA: Insufficient documentation

## 2018-06-14 DIAGNOSIS — C187 Malignant neoplasm of sigmoid colon: Secondary | ICD-10-CM | POA: Insufficient documentation

## 2018-06-14 DIAGNOSIS — Z794 Long term (current) use of insulin: Secondary | ICD-10-CM | POA: Insufficient documentation

## 2018-06-14 DIAGNOSIS — Z5111 Encounter for antineoplastic chemotherapy: Secondary | ICD-10-CM | POA: Insufficient documentation

## 2018-06-14 DIAGNOSIS — I10 Essential (primary) hypertension: Secondary | ICD-10-CM | POA: Insufficient documentation

## 2018-06-14 DIAGNOSIS — E1165 Type 2 diabetes mellitus with hyperglycemia: Secondary | ICD-10-CM | POA: Insufficient documentation

## 2018-06-14 DIAGNOSIS — Z5112 Encounter for antineoplastic immunotherapy: Secondary | ICD-10-CM | POA: Insufficient documentation

## 2018-06-15 ENCOUNTER — Other Ambulatory Visit: Payer: Self-pay

## 2018-06-15 ENCOUNTER — Other Ambulatory Visit: Payer: Self-pay | Admitting: *Deleted

## 2018-06-15 ENCOUNTER — Encounter
Admission: RE | Admit: 2018-06-15 | Discharge: 2018-06-15 | Disposition: A | Discharge status: Home / Self Care | Payer: Medicare Other | Source: Ambulatory Visit | Attending: Surgery | Admitting: Surgery

## 2018-06-15 DIAGNOSIS — Z7982 Long term (current) use of aspirin: Secondary | ICD-10-CM | POA: Diagnosis not present

## 2018-06-15 DIAGNOSIS — C7800 Secondary malignant neoplasm of unspecified lung: Secondary | ICD-10-CM | POA: Diagnosis not present

## 2018-06-15 DIAGNOSIS — E119 Type 2 diabetes mellitus without complications: Secondary | ICD-10-CM | POA: Diagnosis not present

## 2018-06-15 DIAGNOSIS — E785 Hyperlipidemia, unspecified: Secondary | ICD-10-CM | POA: Diagnosis not present

## 2018-06-15 DIAGNOSIS — Z7984 Long term (current) use of oral hypoglycemic drugs: Secondary | ICD-10-CM | POA: Diagnosis not present

## 2018-06-15 DIAGNOSIS — I10 Essential (primary) hypertension: Secondary | ICD-10-CM | POA: Diagnosis not present

## 2018-06-15 DIAGNOSIS — C189 Malignant neoplasm of colon, unspecified: Secondary | ICD-10-CM | POA: Diagnosis present

## 2018-06-15 DIAGNOSIS — Z79899 Other long term (current) drug therapy: Secondary | ICD-10-CM | POA: Diagnosis not present

## 2018-06-15 HISTORY — DX: Dizziness and giddiness: R42

## 2018-06-15 LAB — CBC WITH DIFFERENTIAL/PLATELET
Basophils Absolute: 0.1 10*3/uL (ref 0–0.1)
Basophils Relative: 1 %
EOS ABS: 0.3 10*3/uL (ref 0–0.7)
EOS PCT: 3 %
HCT: 41.5 % (ref 40.0–52.0)
Hemoglobin: 14.1 g/dL (ref 13.0–18.0)
LYMPHS ABS: 2.8 10*3/uL (ref 1.0–3.6)
Lymphocytes Relative: 28 %
MCH: 29.6 pg (ref 26.0–34.0)
MCHC: 33.9 g/dL (ref 32.0–36.0)
MCV: 87.1 fL (ref 80.0–100.0)
MONO ABS: 0.7 10*3/uL (ref 0.2–1.0)
MONOS PCT: 7 %
NEUTROS PCT: 61 %
Neutro Abs: 6.1 10*3/uL (ref 1.4–6.5)
Platelets: 273 10*3/uL (ref 150–440)
RBC: 4.77 MIL/uL (ref 4.40–5.90)
RDW: 13.5 % (ref 11.5–14.5)
WBC: 10.1 10*3/uL (ref 3.8–10.6)

## 2018-06-15 LAB — BASIC METABOLIC PANEL
ANION GAP: 9 (ref 5–15)
BUN: 24 mg/dL — ABNORMAL HIGH (ref 8–23)
CALCIUM: 9.3 mg/dL (ref 8.9–10.3)
CHLORIDE: 107 mmol/L (ref 98–111)
CO2: 21 mmol/L — ABNORMAL LOW (ref 22–32)
CREATININE: 1.19 mg/dL (ref 0.61–1.24)
GFR calc non Af Amer: 57 mL/min — ABNORMAL LOW (ref >60)
Glucose, Bld: 135 mg/dL — ABNORMAL HIGH (ref 70–99)
Potassium: 4.4 mmol/L (ref 3.5–5.1)
SODIUM: 137 mmol/L (ref 135–145)

## 2018-06-15 MED ORDER — LIDOCAINE-PRILOCAINE 2.5-2.5 % EX CREA: TOPICAL_CREAM | CUTANEOUS | 1 refills | Status: DC

## 2018-06-15 NOTE — Patient Instructions (Signed)
Your procedure is scheduled on: Thursday 06/18/18 Report to [facility]. To find out your arrival time please call [phone] between 1PM - 3PM on Wednesday 06/17/18.  Remember: Instructions that are not followed completely may result in serious medical risk, up to and including death, or upon the discretion of your surgeon and anesthesiologist your surgery may need to be rescheduled.     _X__ 1. Do not eat food after midnight the night before your procedure.                 No gum chewing or hard candies. You may drink clear liquids up to 2 hours                 before you are scheduled to arrive for your surgery- DO not drink clear                 liquids within 2 hours of the start of your surgery.                 Clear Liquids include:  water, apple juice without pulp, clear carbohydrate                 drink such as Clearfast or Gatorade, Black Coffee or Tea (Do not add                 anything to coffee or tea).  __X__2.  On the morning of surgery brush your teeth with toothpaste and water, you                 may rinse your mouth with mouthwash if you wish.  Do not swallow any              toothpaste of mouthwash.     _X__ 3.  No Alcohol for 24 hours before or after surgery.   _X__ 4.  Do Not Smoke or use e-cigarettes For 24 Hours Prior to Your Surgery.                 Do not use any chewable tobacco products for at least 6 hours prior to                 surgery.  ____  5.  Bring all medications with you on the day of surgery if instructed.   __X__  6.  Notify your doctor if there is any change in your medical condition      (cold, fever, infections).     Do not wear jewelry, make-up, hairpins, clips or nail polish. Do not wear lotions, powders, or perfumes.  Do not shave 48 hours prior to surgery. Men may shave face and neck. Do not bring valuables to the hospital.    [facility] is not responsible for any belongings or  valuables.  Contacts, dentures/partials or body piercings may not be worn into surgery. Bring a case for your contacts, glasses or hearing aids, a denture cup will be supplied. Leave your suitcase in the car. After surgery it may be brought to your room. For patients admitted to the hospital, discharge time is determined by your treatment team.   Patients discharged the day of surgery will not be allowed to drive home.   Please read over the following fact sheets that you were given:   MRSA Information  __X__ Take these medicines the morning of surgery with A SIP OF WATER:  1. LORATADINE  2.   3.   4.  5.  6.  ____ Fleet Enema (as directed)   __X__ Use CHG Soap/SAGE wipes as directed  ____ Use inhalers on the day of surgery  __X__ Stop metformin/Janumet/Farxiga 2 days prior to surgery STOP TODAY   ____ Take 1/2 of usual insulin dose the night before surgery. No insulin the morning          of surgery.   ____ Stop Blood Thinners Coumadin/Plavix/Xarelto/Pleta/Pradaxa/Eliquis/Effient/Aspirin  on   Or contact your Surgeon, Cardiologist or Medical Doctor regarding  ability to stop your blood thinners  __X__ Stop Anti-inflammatories 7 days before surgery such as Advil, Ibuprofen, Motrin,  BC or Goodies Powder, Naprosyn, Naproxen, Aleve, ASPIRIN (STOP TODAY) MAY TAKE TYLENOL    __X__ Stop all herbal supplements, fish oil or vitamin E until after surgery.    ____ Bring C-Pap to the hospital.

## 2018-06-16 MED ORDER — CEFAZOLIN SODIUM-DEXTROSE 2-4 GM/100ML-% IV SOLN: 2.0000 g | INTRAVENOUS | Status: DC

## 2018-06-17 ENCOUNTER — Ambulatory Visit
Admission: RE | Admit: 2018-06-17 | Discharge: 2018-06-17 | Disposition: A | Discharge status: Home / Self Care | Payer: Medicare Other | Source: Ambulatory Visit | Attending: Surgery | Admitting: Surgery

## 2018-06-17 ENCOUNTER — Encounter
Admission: RE | Disposition: A | Discharge status: Home / Self Care | Payer: Self-pay | Source: Ambulatory Visit | Attending: Surgery

## 2018-06-17 ENCOUNTER — Ambulatory Visit: Payer: Medicare Other | Admitting: Certified Registered Nurse Anesthetist

## 2018-06-17 ENCOUNTER — Ambulatory Visit: Payer: Medicare Other

## 2018-06-17 ENCOUNTER — Other Ambulatory Visit: Payer: Self-pay

## 2018-06-17 DIAGNOSIS — E785 Hyperlipidemia, unspecified: Secondary | ICD-10-CM | POA: Insufficient documentation

## 2018-06-17 DIAGNOSIS — I10 Essential (primary) hypertension: Secondary | ICD-10-CM | POA: Insufficient documentation

## 2018-06-17 DIAGNOSIS — C7800 Secondary malignant neoplasm of unspecified lung: Secondary | ICD-10-CM | POA: Insufficient documentation

## 2018-06-17 DIAGNOSIS — Z7984 Long term (current) use of oral hypoglycemic drugs: Secondary | ICD-10-CM | POA: Insufficient documentation

## 2018-06-17 DIAGNOSIS — C189 Malignant neoplasm of colon, unspecified: Secondary | ICD-10-CM | POA: Insufficient documentation

## 2018-06-17 DIAGNOSIS — Z7982 Long term (current) use of aspirin: Secondary | ICD-10-CM | POA: Insufficient documentation

## 2018-06-17 DIAGNOSIS — C186 Malignant neoplasm of descending colon: Secondary | ICD-10-CM

## 2018-06-17 DIAGNOSIS — Z79899 Other long term (current) drug therapy: Secondary | ICD-10-CM | POA: Insufficient documentation

## 2018-06-17 DIAGNOSIS — Z452 Encounter for adjustment and management of vascular access device: Secondary | ICD-10-CM

## 2018-06-17 DIAGNOSIS — E119 Type 2 diabetes mellitus without complications: Secondary | ICD-10-CM | POA: Insufficient documentation

## 2018-06-17 HISTORY — PX: PORTACATH PLACEMENT: SHX2246

## 2018-06-17 LAB — GLUCOSE, CAPILLARY
Glucose-Capillary: 204 mg/dL — ABNORMAL HIGH (ref 70–99)
Glucose-Capillary: 197 mg/dL — ABNORMAL HIGH (ref 70–99)

## 2018-06-17 SURGERY — INSERTION, TUNNELED CENTRAL VENOUS DEVICE, WITH PORT
Anesthesia: General | Wound class: Clean

## 2018-06-17 MED ORDER — SODIUM CHLORIDE 0.9 % IV SOLN
INTRAVENOUS | Status: DC
  Administered 2018-06-17: 14:00:00 via INTRAVENOUS

## 2018-06-17 MED ORDER — ONDANSETRON HCL 4 MG/2ML IJ SOLN
INTRAMUSCULAR | Status: DC | PRN
  Administered 2018-06-17: 4 mg via INTRAVENOUS

## 2018-06-17 MED ORDER — ONDANSETRON HCL 4 MG/2ML IJ SOLN
INTRAMUSCULAR | Status: AC
  Filled 2018-06-17: qty 2

## 2018-06-17 MED ORDER — BUPIVACAINE-EPINEPHRINE (PF) 0.25% -1:200000 IJ SOLN
INTRAMUSCULAR | Status: DC | PRN
  Administered 2018-06-17: 5 mL

## 2018-06-17 MED ORDER — MIDAZOLAM HCL 2 MG/2ML IJ SOLN
INTRAMUSCULAR | Status: AC
  Filled 2018-06-17: qty 2

## 2018-06-17 MED ORDER — HEPARIN SODIUM (PORCINE) 5000 UNIT/ML IJ SOLN
5000.0000 [IU] | Freq: Once | INTRAMUSCULAR | Status: AC
  Administered 2018-06-17: 5000 [IU] via SUBCUTANEOUS

## 2018-06-17 MED ORDER — PHENYLEPHRINE HCL 10 MG/ML IJ SOLN
INTRAMUSCULAR | Status: AC
  Filled 2018-06-17: qty 1

## 2018-06-17 MED ORDER — SUGAMMADEX SODIUM 500 MG/5ML IV SOLN
INTRAVENOUS | Status: AC
  Filled 2018-06-17: qty 5

## 2018-06-17 MED ORDER — FAMOTIDINE 20 MG PO TABS
20.0000 mg | ORAL_TABLET | Freq: Once | ORAL | Status: AC
  Administered 2018-06-17: 20 mg via ORAL

## 2018-06-17 MED ORDER — DEXAMETHASONE SODIUM PHOSPHATE 10 MG/ML IJ SOLN
INTRAMUSCULAR | Status: DC | PRN
  Administered 2018-06-17: 6 mg via INTRAVENOUS

## 2018-06-17 MED ORDER — FAMOTIDINE 20 MG PO TABS
ORAL_TABLET | ORAL | Status: AC
  Filled 2018-06-17: qty 1

## 2018-06-17 MED ORDER — CEFAZOLIN SODIUM-DEXTROSE 2-4 GM/100ML-% IV SOLN
INTRAVENOUS | Status: AC
  Filled 2018-06-17: qty 100

## 2018-06-17 MED ORDER — BUPIVACAINE-EPINEPHRINE (PF) 0.25% -1:200000 IJ SOLN
INTRAMUSCULAR | Status: AC
  Filled 2018-06-17: qty 30

## 2018-06-17 MED ORDER — CHLORHEXIDINE GLUCONATE CLOTH 2 % EX PADS: 6.0000 | MEDICATED_PAD | Freq: Once | CUTANEOUS | Status: DC

## 2018-06-17 MED ORDER — LIDOCAINE HCL (CARDIAC) PF 100 MG/5ML IV SOSY
PREFILLED_SYRINGE | INTRAVENOUS | Status: DC | PRN
  Administered 2018-06-17: 60 mg via INTRAVENOUS

## 2018-06-17 MED ORDER — FENTANYL CITRATE (PF) 100 MCG/2ML IJ SOLN
INTRAMUSCULAR | Status: DC | PRN
  Administered 2018-06-17: 25 ug via INTRAVENOUS

## 2018-06-17 MED ORDER — HEPARIN SODIUM (PORCINE) 5000 UNIT/ML IJ SOLN
INTRAMUSCULAR | Status: AC
  Administered 2018-06-17: 5000 [IU] via SUBCUTANEOUS
  Filled 2018-06-17: qty 1

## 2018-06-17 MED ORDER — HEPARIN SODIUM (PORCINE) 5000 UNIT/ML IJ SOLN
INTRAMUSCULAR | Status: AC
  Filled 2018-06-17: qty 1

## 2018-06-17 MED ORDER — HYDROCODONE-ACETAMINOPHEN 5-300 MG PO TABS: 1.0000 | ORAL_TABLET | ORAL | 0 refills | Status: DC | PRN

## 2018-06-17 MED ORDER — FENTANYL CITRATE (PF) 100 MCG/2ML IJ SOLN
INTRAMUSCULAR | Status: AC
  Filled 2018-06-17: qty 2

## 2018-06-17 MED ORDER — PROPOFOL 10 MG/ML IV BOLUS
INTRAVENOUS | Status: DC | PRN
  Administered 2018-06-17: 120 mg via INTRAVENOUS
  Administered 2018-06-17: 30 mg via INTRAVENOUS

## 2018-06-17 MED ORDER — MIDAZOLAM HCL 2 MG/2ML IJ SOLN
INTRAMUSCULAR | Status: DC | PRN
  Administered 2018-06-17: 1 mg via INTRAVENOUS

## 2018-06-17 MED ORDER — DEXAMETHASONE SODIUM PHOSPHATE 10 MG/ML IJ SOLN
INTRAMUSCULAR | Status: AC
  Filled 2018-06-17: qty 1

## 2018-06-17 SURGICAL SUPPLY — 24 items
ADHESIVE MASTISOL STRL (MISCELLANEOUS) ×3 IMPLANT
CANISTER SUCT 1200ML W/VALVE (MISCELLANEOUS) ×3 IMPLANT
CHLORAPREP W/TINT 26ML (MISCELLANEOUS) ×3 IMPLANT
CLOSURE WOUND 1/2 X4 (GAUZE/BANDAGES/DRESSINGS) ×1
COVER LIGHT HANDLE STERIS (MISCELLANEOUS) ×6 IMPLANT
DRAPE C-ARM XRAY 36X54 (DRAPES) ×3 IMPLANT
ELECT REM PT RETURN 9FT ADLT (ELECTROSURGICAL) ×3
ELECTRODE REM PT RTRN 9FT ADLT (ELECTROSURGICAL) ×1 IMPLANT
GAUZE SPONGE 4X4 12PLY STRL (GAUZE/BANDAGES/DRESSINGS) ×3 IMPLANT
GLOVE BIO SURGEON STRL SZ8 (GLOVE) ×6 IMPLANT
GOWN STRL REUS W/ TWL LRG LVL3 (GOWN DISPOSABLE) ×2 IMPLANT
GOWN STRL REUS W/TWL LRG LVL3 (GOWN DISPOSABLE) ×4
KIT PORT POWER 8FR ISP CVUE (Port) ×3 IMPLANT
KIT TURNOVER KIT A (KITS) ×3 IMPLANT
LABEL OR SOLS (LABEL) ×3 IMPLANT
NEEDLE FILTER BLUNT 18X 1/2SAF (NEEDLE) ×2
NEEDLE FILTER BLUNT 18X1 1/2 (NEEDLE) ×1 IMPLANT
PACK PORT-A-CATH (MISCELLANEOUS) ×3 IMPLANT
STRIP CLOSURE SKIN 1/2X4 (GAUZE/BANDAGES/DRESSINGS) ×2 IMPLANT
SUT MNCRL AB 4-0 PS2 18 (SUTURE) ×3 IMPLANT
SUT PROLENE 3 0 SH DA (SUTURE) ×3 IMPLANT
SUT VIC AB 3-0 SH 27 (SUTURE) ×2
SUT VIC AB 3-0 SH 27X BRD (SUTURE) ×1 IMPLANT
SYR 3ML LL SCALE MARK (SYRINGE) ×3 IMPLANT

## 2018-06-17 NOTE — Anesthesia Procedure Notes (Signed)
Procedure Name: LMA Insertion Date/Time: 06/17/2018 2:47 PM Performed by: Dionne Bucy, CRNA Pre-anesthesia Checklist: Patient identified, Patient being monitored, Timeout performed, Emergency Drugs available and Suction available Patient Re-evaluated:Patient Re-evaluated prior to induction Oxygen Delivery Method: Circle system utilized Preoxygenation: Pre-oxygenation with 100% oxygen Induction Type: IV induction Ventilation: Mask ventilation without difficulty LMA: LMA inserted LMA Size: 4.0 Tube type: Oral Number of attempts: 1 Placement Confirmation: positive ETCO2 and breath sounds checked- equal and bilateral Tube secured with: Tape Dental Injury: Teeth and Oropharynx as per pre-operative assessment

## 2018-06-17 NOTE — Transfer of Care (Signed)
Immediate Anesthesia Transfer of Care Note  Patient: Miguel Flores  Procedure(s) Performed: INSERTION PORT-A-CATH, WITH FLUOROSCOPY (N/A )  Patient Location: PACU  Anesthesia Type:General  Level of Consciousness: drowsy and patient cooperative  Airway & Oxygen Therapy: Patient Spontanous Breathing and Patient connected to face mask oxygen  Post-op Assessment: Report given to RN and Post -op Vital signs reviewed and stable  Post vital signs: Reviewed and stable  Last Vitals:  Vitals Value Taken Time  BP 144/72 06/17/2018  3:19 PM  Temp 36.5 C 06/17/2018  3:19 PM  Pulse 66 06/17/2018  3:19 PM  Resp 17 06/17/2018  3:19 PM  SpO2 100 % 06/17/2018  3:19 PM  Vitals shown include unvalidated device data.  Last Pain:  Vitals:   06/17/18 1352  TempSrc: Tympanic  PainSc: 2          Complications: No apparent anesthesia complications

## 2018-06-17 NOTE — Op Note (Signed)
  Pre-operative Diagnosis: Metastatic colon cancer  Post-operative Diagnosis: Same Procedure port placement with fluoroscopy  Surgeon: Jerrol Banana. Burt Knack, MD FACS  Anesthesia: Surgical tech  Procedure: Port placement with fluoroscopy  Procedure Details  The patient was seen again in the Holding Room. The benefits, complications, treatment options, and expected outcomes were discussed with the patient. The risks of bleeding, infection, recurrence of symptoms, failure to resolve symptoms,  thrombosis nonfunction breakage pneumothorax hemopneumothorax any of which could require chest tube or further surgery were reviewed with the patient.   The patient was taken to Operating Room, identified as Miguel Flores and the procedure verified.  A Time Out was held and the above information confirmed.  Prior to the induction of general anesthesia, antibiotic prophylaxis was administered. VTE prophylaxis was in place. Appropriate anesthesia was then administered and tolerated well. The chest was prepped with Chloraprep and draped in the sterile fashion. The patient was positioned in the supine position. Then the patient was placed in Trendelenburg position.  Patient was prepped and draped in sterile fashion and in a Trendelenburg position local anesthetic was infiltrated into the skin and subcutaneous tissues in the anterior chest wall. The large bore needle was placed into the subclavian vein without difficulty and then the Seldinger wire was advanced. Fluoroscopy was utilized to confirm that the Seldinger wire was in the superior vena cava.  An incision was made and a port pocket developed with blunt and electrocautery dissection. The introducer dilator was placed over the Seldinger wire the wire was removed. The previously flushed catheter was placed into the introducer dilator and the peel-away sheath was removed. The catheter length was confirmed and trimmed utilizing fluoroscopy for proper positioning.  The catheter was then attached to the previously flushed port. The port was placed into the pocket. The port was held in with 3-0 Prolenes and flushed for function and heparin locked.  The wound was closed with interrupted 3-0 Vicryl followed by 4-0 subcuticular Monocryl sutures. Steri-Strips Mastisol and sterile dressings were placed  Patient was taken to the recovery room in stable condition where a postoperative chest film has been ordered.    Findings: Port placed in the subclavian position verified by fluoroscopy. A postoperative chest film has been ordered.  Estimated Blood Loss: Minimal         Drains: None         Specimens: None       Complications: None                  Condition: Stable   Miguel Flores E. Burt Knack, MD, FACS

## 2018-06-17 NOTE — Anesthesia Post-op Follow-up Note (Signed)
Anesthesia QCDR form completed.        

## 2018-06-17 NOTE — Discharge Instructions (Signed)
Remove dressing in 24 hours. °May shower in 24 hours. °Leave paper strips in place. °Resume all home medications. °Follow-up with Dr. Cooper in 10 days. ° °AMBULATORY SURGERY  °DISCHARGE INSTRUCTIONS ° ° °1) The drugs that you were given will stay in your system until tomorrow so for the next 24 hours you should not: ° °A) Drive an automobile °B) Make any legal decisions °C) Drink any alcoholic beverage ° ° °2) You may resume regular meals tomorrow.  Today it is better to start with liquids and gradually work up to solid foods. ° °You may eat anything you prefer, but it is better to start with liquids, then soup and crackers, and gradually work up to solid foods. ° ° °3) Please notify your doctor immediately if you have any unusual bleeding, trouble breathing, redness and pain at the surgery site, drainage, fever, or pain not relieved by medication. ° ° ° °4) Additional Instructions: ° ° ° ° ° ° ° °Please contact your physician with any problems or Same Day Surgery at [phone], Monday through Friday 6 am to 4 pm, or [place] at [place] Main number at [phone]. °

## 2018-06-17 NOTE — Progress Notes (Signed)
Preoperative Review   Patient is met in the preoperative holding area. The history is reviewed in the chart and with the patient. I personally reviewed the options and rationale as well as the risks of this procedure that have been previously discussed with the patient. All questions asked by the patient and/or family were answered to their satisfaction.  Patient agrees to proceed with this procedure at this time.  Richard E Cooper M.D. FACS  

## 2018-06-17 NOTE — Anesthesia Preprocedure Evaluation (Signed)
Anesthesia Evaluation  Patient identified by MRN, date of birth, ID band Patient awake    Reviewed: Allergy & Precautions, NPO status , Patient's Chart, lab work & pertinent test results  History of Anesthesia Complications Negative for: history of anesthetic complications  Airway Mallampati: II  TM Distance: >3 FB Neck ROM: Full    Dental no notable dental hx.    Pulmonary neg sleep apnea, neg COPD,  Lung mets    breath sounds clear to auscultation- rhonchi (-) wheezing      Cardiovascular hypertension, Pt. on medications (-) CAD, (-) Past MI, (-) Cardiac Stents and (-) CABG  Rhythm:Regular Rate:Normal - Systolic murmurs and - Diastolic murmurs    Neuro/Psych negative neurological ROS  negative psych ROS   GI/Hepatic negative GI ROS, Neg liver ROS,   Endo/Other  diabetes, Oral Hypoglycemic Agents  Renal/GU Renal disease: hx of nephrolithiasis.     Musculoskeletal   Abdominal (+) + obese,   Peds  Hematology  (+) anemia ,   Anesthesia Other Findings Past Medical History: No date: Cancer ([place])     Comment:  skin cancer-nose w graft,also shoulder- basal cell per               pt No date: Cataract     Comment:  r eye No date: Chronic kidney disease     Comment:  kidney stones 20 y ago per pt No date: Colon cancer ([place]) No date: Diabetes mellitus without complication ([place]) No date: Eczema No date: History of kidney stones No date: Hyperlipidemia No date: Hypertension No date: Vertigo   Reproductive/Obstetrics                             Anesthesia Physical Anesthesia Plan  ASA: III  Anesthesia Plan: General   Post-op Pain Management:    Induction: Intravenous  PONV Risk Score and Plan: 1 and Ondansetron and Midazolam  Airway Management Planned: LMA  Additional Equipment:   Intra-op Plan:   Post-operative Plan:   Informed Consent: I have reviewed the patients History  and Physical, chart, labs and discussed the procedure including the risks, benefits and alternatives for the proposed anesthesia with the patient or authorized representative who has indicated his/her understanding and acceptance.   Dental advisory given  Plan Discussed with: CRNA and Anesthesiologist  Anesthesia Plan Comments:         Anesthesia Quick Evaluation

## 2018-06-17 NOTE — Anesthesia Postprocedure Evaluation (Signed)
Anesthesia Post Note  Patient: Miguel Flores  Procedure(s) Performed: INSERTION PORT-A-CATH, WITH FLUOROSCOPY (N/A )  Patient location during evaluation: PACU Anesthesia Type: General Level of consciousness: awake and alert and oriented Pain management: pain level controlled Vital Signs Assessment: post-procedure vital signs reviewed and stable Respiratory status: spontaneous breathing, nonlabored ventilation and respiratory function stable Cardiovascular status: blood pressure returned to baseline and stable Postop Assessment: no signs of nausea or vomiting Anesthetic complications: no     Last Vitals:  Vitals:   06/17/18 1534 06/17/18 1549  BP: 140/73 (!) 141/78  Pulse: 67 64  Resp: 19 19  Temp:  37 C  SpO2: 95% 99%    Last Pain:  Vitals:   06/17/18 1549  TempSrc:   PainSc: 0-No pain                 Braian Tijerina

## 2018-06-18 ENCOUNTER — Inpatient Hospital Stay: Payer: Medicare Other

## 2018-06-18 ENCOUNTER — Other Ambulatory Visit: Payer: Self-pay

## 2018-06-18 ENCOUNTER — Other Ambulatory Visit: Payer: Self-pay | Admitting: Oncology

## 2018-06-18 ENCOUNTER — Ambulatory Visit
Admission: RE | Admit: 2018-06-18 | Discharge: 2018-06-18 | Disposition: A | Discharge status: Home / Self Care | Payer: Medicare Other | Source: Ambulatory Visit | Attending: Oncology | Admitting: Oncology

## 2018-06-18 DIAGNOSIS — C7800 Secondary malignant neoplasm of unspecified lung: Secondary | ICD-10-CM | POA: Diagnosis not present

## 2018-06-18 DIAGNOSIS — R918 Other nonspecific abnormal finding of lung field: Secondary | ICD-10-CM | POA: Diagnosis not present

## 2018-06-18 DIAGNOSIS — E1165 Type 2 diabetes mellitus with hyperglycemia: Secondary | ICD-10-CM | POA: Diagnosis not present

## 2018-06-18 DIAGNOSIS — C187 Malignant neoplasm of sigmoid colon: Secondary | ICD-10-CM

## 2018-06-18 DIAGNOSIS — Z794 Long term (current) use of insulin: Secondary | ICD-10-CM | POA: Diagnosis not present

## 2018-06-18 DIAGNOSIS — I700 Atherosclerosis of aorta: Secondary | ICD-10-CM | POA: Insufficient documentation

## 2018-06-18 DIAGNOSIS — Z5112 Encounter for antineoplastic immunotherapy: Secondary | ICD-10-CM | POA: Diagnosis present

## 2018-06-18 DIAGNOSIS — C189 Malignant neoplasm of colon, unspecified: Secondary | ICD-10-CM | POA: Insufficient documentation

## 2018-06-18 DIAGNOSIS — K8020 Calculus of gallbladder without cholecystitis without obstruction: Secondary | ICD-10-CM | POA: Diagnosis not present

## 2018-06-18 DIAGNOSIS — K5730 Diverticulosis of large intestine without perforation or abscess without bleeding: Secondary | ICD-10-CM | POA: Insufficient documentation

## 2018-06-18 DIAGNOSIS — N200 Calculus of kidney: Secondary | ICD-10-CM | POA: Insufficient documentation

## 2018-06-18 DIAGNOSIS — Z5111 Encounter for antineoplastic chemotherapy: Secondary | ICD-10-CM | POA: Diagnosis not present

## 2018-06-18 DIAGNOSIS — I10 Essential (primary) hypertension: Secondary | ICD-10-CM | POA: Diagnosis not present

## 2018-06-18 DIAGNOSIS — R809 Proteinuria, unspecified: Secondary | ICD-10-CM | POA: Diagnosis not present

## 2018-06-18 LAB — COMPREHENSIVE METABOLIC PANEL
ALT: 22 U/L (ref 0–44)
ANION GAP: 13 (ref 5–15)
AST: 22 U/L (ref 15–41)
Albumin: 4.4 g/dL (ref 3.5–5.0)
Alkaline Phosphatase: 64 U/L (ref 38–126)
BUN: 20 mg/dL (ref 8–23)
CO2: 20 mmol/L — ABNORMAL LOW (ref 22–32)
CREATININE: 1.26 mg/dL — AB (ref 0.61–1.24)
Calcium: 9.3 mg/dL (ref 8.9–10.3)
Chloride: 102 mmol/L (ref 98–111)
GFR, EST NON AFRICAN AMERICAN: 53 mL/min — AB (ref >60)
Glucose, Bld: 305 mg/dL — ABNORMAL HIGH (ref 70–99)
Potassium: 4.2 mmol/L (ref 3.5–5.1)
Sodium: 135 mmol/L (ref 135–145)
Total Bilirubin: 0.4 mg/dL (ref 0.3–1.2)
Total Protein: 8 g/dL (ref 6.5–8.1)

## 2018-06-18 LAB — CBC WITH DIFFERENTIAL/PLATELET
BASOS ABS: 0 10*3/uL (ref 0–0.1)
Basophils Relative: 0 %
Eosinophils Absolute: 0 10*3/uL (ref 0–0.7)
Eosinophils Relative: 0 %
HCT: 42.5 % (ref 40.0–52.0)
Hemoglobin: 14.4 g/dL (ref 13.0–18.0)
LYMPHS PCT: 20 %
Lymphs Abs: 2.5 10*3/uL (ref 1.0–3.6)
MCH: 29.4 pg (ref 26.0–34.0)
MCHC: 33.8 g/dL (ref 32.0–36.0)
MCV: 86.9 fL (ref 80.0–100.0)
MONO ABS: 0.8 10*3/uL (ref 0.2–1.0)
Monocytes Relative: 7 %
Neutro Abs: 9 10*3/uL — ABNORMAL HIGH (ref 1.4–6.5)
Neutrophils Relative %: 73 %
PLATELETS: 289 10*3/uL (ref 150–440)
RBC: 4.89 MIL/uL (ref 4.40–5.90)
RDW: 13.4 % (ref 11.5–14.5)
WBC: 12.4 10*3/uL — ABNORMAL HIGH (ref 3.8–10.6)

## 2018-06-18 MED ORDER — DEXAMETHASONE 4 MG PO TABS: 8.0000 mg | ORAL_TABLET | Freq: Every day | ORAL | 1 refills | Status: DC

## 2018-06-18 MED ORDER — IOHEXOL 300 MG/ML  SOLN
100.0000 mL | Freq: Once | INTRAMUSCULAR | Status: AC | PRN
  Administered 2018-06-18: 100 mL via INTRAVENOUS

## 2018-06-18 MED ORDER — PROCHLORPERAZINE MALEATE 10 MG PO TABS: 10.0000 mg | ORAL_TABLET | Freq: Four times a day (QID) | ORAL | 1 refills | Status: DC | PRN

## 2018-06-18 MED ORDER — LORAZEPAM 0.5 MG PO TABS: 0.5000 mg | ORAL_TABLET | Freq: Four times a day (QID) | ORAL | 0 refills | Status: DC | PRN

## 2018-06-18 MED ORDER — ONDANSETRON HCL 8 MG PO TABS: 8.0000 mg | ORAL_TABLET | Freq: Two times a day (BID) | ORAL | 1 refills | Status: DC | PRN

## 2018-06-18 MED ORDER — LIDOCAINE-PRILOCAINE 2.5-2.5 % EX CREA: TOPICAL_CREAM | CUTANEOUS | 3 refills | Status: DC

## 2018-06-18 NOTE — Progress Notes (Signed)
Treatment plan folfox/avastin

## 2018-06-19 LAB — CEA: CEA: 2.7 ng/mL (ref 0.0–4.7)

## 2018-06-22 ENCOUNTER — Encounter: Payer: Self-pay | Admitting: Oncology

## 2018-06-22 ENCOUNTER — Inpatient Hospital Stay (HOSPITAL_BASED_OUTPATIENT_CLINIC_OR_DEPARTMENT_OTHER): Payer: Medicare Other | Admitting: Oncology

## 2018-06-22 ENCOUNTER — Inpatient Hospital Stay: Payer: Medicare Other

## 2018-06-22 VITALS — BP 150/78 | HR 82 | Temp 98.0°F | Resp 18 | Ht 66.0 in | Wt 192.7 lb

## 2018-06-22 DIAGNOSIS — Z5111 Encounter for antineoplastic chemotherapy: Secondary | ICD-10-CM

## 2018-06-22 DIAGNOSIS — Z794 Long term (current) use of insulin: Secondary | ICD-10-CM

## 2018-06-22 DIAGNOSIS — C187 Malignant neoplasm of sigmoid colon: Secondary | ICD-10-CM | POA: Diagnosis not present

## 2018-06-22 DIAGNOSIS — E1165 Type 2 diabetes mellitus with hyperglycemia: Secondary | ICD-10-CM

## 2018-06-22 DIAGNOSIS — C7800 Secondary malignant neoplasm of unspecified lung: Secondary | ICD-10-CM | POA: Diagnosis not present

## 2018-06-22 DIAGNOSIS — I10 Essential (primary) hypertension: Secondary | ICD-10-CM

## 2018-06-22 DIAGNOSIS — Z5112 Encounter for antineoplastic immunotherapy: Secondary | ICD-10-CM | POA: Diagnosis not present

## 2018-06-22 DIAGNOSIS — C189 Malignant neoplasm of colon, unspecified: Secondary | ICD-10-CM

## 2018-06-22 MED ORDER — PALONOSETRON HCL INJECTION 0.25 MG/5ML
0.2500 mg | Freq: Once | INTRAVENOUS | Status: AC
  Administered 2018-06-22: 0.25 mg via INTRAVENOUS
  Filled 2018-06-22: qty 5

## 2018-06-22 MED ORDER — DEXAMETHASONE SODIUM PHOSPHATE 10 MG/ML IJ SOLN
10.0000 mg | Freq: Once | INTRAMUSCULAR | Status: AC
  Administered 2018-06-22: 10 mg via INTRAVENOUS
  Filled 2018-06-22: qty 1

## 2018-06-22 MED ORDER — SODIUM CHLORIDE 0.9 % IV SOLN
2400.0000 mg/m2 | INTRAVENOUS | Status: DC
  Administered 2018-06-22: 4850 mg via INTRAVENOUS
  Filled 2018-06-22: qty 97

## 2018-06-22 MED ORDER — LEUCOVORIN CALCIUM INJECTION 350 MG
800.0000 mg | Freq: Once | INTRAVENOUS | Status: AC
  Administered 2018-06-22: 800 mg via INTRAVENOUS
  Filled 2018-06-22: qty 25

## 2018-06-22 MED ORDER — OXALIPLATIN CHEMO INJECTION 100 MG/20ML
85.0000 mg/m2 | Freq: Once | INTRAVENOUS | Status: AC
  Administered 2018-06-22: 170 mg via INTRAVENOUS
  Filled 2018-06-22: qty 34

## 2018-06-22 MED ORDER — DEXTROSE 5 % IV SOLN
Freq: Once | INTRAVENOUS | Status: AC
  Administered 2018-06-22: 10:00:00 via INTRAVENOUS
  Filled 2018-06-22: qty 1000

## 2018-06-22 MED ORDER — DEXAMETHASONE SODIUM PHOSPHATE 100 MG/10ML IJ SOLN: 10.0000 mg | Freq: Once | INTRAMUSCULAR | Status: DC

## 2018-06-22 MED ORDER — FLUOROURACIL CHEMO INJECTION 2.5 GM/50ML
400.0000 mg/m2 | Freq: Once | INTRAVENOUS | Status: AC
  Administered 2018-06-22: 800 mg via INTRAVENOUS
  Filled 2018-06-22: qty 16

## 2018-06-22 NOTE — Progress Notes (Signed)
Hematology/Oncology Consult note 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 Patient is a 77 year old male who presented with evidence of bowel obstruction on 02/06/2017. At that time he had progressive abdominal distention and no bowel movement for about one week. CT abdomen showed an apple core lesion in the descending/sigmoid colon.  2. Patient underwent Hartmann's procedure for obstructing sigmoid colon mass on 02/06/2017. Preoperative CEA was 5.0  3. 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 It is 0 out of 15 lymph nodes were positive for malignancy. Perineural and lymphovascular invasion was present. Margins were negative.pT3N0. MMR stable.  4. Given that he had high risk stage II colon cancer including he presented with obstruction and has LVI and perineural invasion- adjuvant xeloda chemotherapy was recommended for 6 months.   5.Patient had evidence of hand foot syndrome after cycle 3. Cycle 4 delayed by 1 week. Topical urea cream prescribed  6. Patient completed 8 cycles of adjuvant xeloda in October 2018. polps seen in sigmoid and decending colon but negative for malignancy  7. Repeat Ct abdomen after 8 cycles showed no metastatic disease. 6 mm lung nodule noted in RLL  8.Patient had a repeat colonoscopy in October 2018 which showed polyps in his transverse and descending colon which were taken out and were negative for  high-grade dysplasia or malignancy. Patient subsequently underwent colostomy takedown procedure by Dr. Burt Knack  9. Repeat CT chest abdomen/ pelvis showed increase in the size of previously seen lung nodules largest being 1.4 cm consistent with metastatic disease. PET/CT showed mild uptake in 2 lung nodules. Others were not hypermetabolic. No other evidence of metastatic disease  10. Repeat lung biopsy consistent with colon adenocarcinoma. 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  Given the slow rate of growth of his colon cancer and desire to maintain his quality of life plan was to start chemotherapy after he gets a 53-monthbreak in summer.  Repeat CT chest abdomen and pelvis in August 2019 showed mild increase in the size of his lung nodules.  No new lung nodules are no new sites of distant metastatic disease.  Interval history-he went to beach about 3-4 times during the last couple of months and had a good time.  His appetite is good and his weight is stable.  He denies any shortness of breath abdominal pain or other complaints.  ECOG PS- 0 Pain scale- 0   Review of systems- Review of Systems  Constitutional: Negative for chills, fever, malaise/fatigue and weight loss.  HENT: Negative for congestion, ear discharge and nosebleeds.   Eyes: Negative for blurred vision.  Respiratory: Negative for cough, hemoptysis, sputum production, shortness of breath and wheezing.   Cardiovascular: Negative for chest pain, palpitations, orthopnea and claudication.  Gastrointestinal: Negative for abdominal pain, blood in stool, constipation, diarrhea, heartburn, melena, nausea and vomiting.  Genitourinary: Negative for dysuria, flank pain, frequency, hematuria and urgency.  Musculoskeletal: Negative for back pain, joint pain and myalgias.  Skin: Negative for rash.  Neurological: Negative for  dizziness, tingling, focal weakness, seizures, weakness and headaches.  Endo/Heme/Allergies: Does  not bruise/bleed easily.  Psychiatric/Behavioral: Negative for depression and suicidal ideas. The patient does not have insomnia.      No Known Allergies   Past Medical History:  Diagnosis Date  . Cancer ([place])    skin cancer-nose w graft,also shoulder- basal cell per pt  . Cataract    r eye  . Chronic kidney disease    kidney stones 20 y ago per pt  . Colon cancer ([place])   . Diabetes mellitus without complication ([place])   . Eczema   . History of kidney stones   . Hyperlipidemia   . Hypertension   . Vertigo      Past Surgical History:  Procedure Laterality Date  . CATARACT EXTRACTION Left   . COLECTOMY WITH COLOSTOMY CREATION/HARTMANN PROCEDURE N/A 02/06/2017   Procedure: COLECTOMY WITH COLOSTOMY CREATION/HARTMANN PROCEDURE;  Surgeon: [name], MD;  Location: [facility] ORS;  Service: General;  Laterality: N/A;  . COLON SURGERY    . COLONOSCOPY WITH PROPOFOL N/A 09/04/2017   Procedure: COLONOSCOPY WITH PROPOFOL;  Surgeon: Jonathon Bellows, MD;  Location: [facility] ENDOSCOPY;  Service: Gastroenterology;  Laterality: N/A;  . COLOSTOMY CLOSURE N/A 10/20/2017   Procedure: COLOSTOMY CLOSURE;  Surgeon: [name], MD;  Location: [facility] ORS;  Service: General;  Laterality: N/A;  . CYSTOSCOPY    . FLEXIBLE SIGMOIDOSCOPY N/A 02/06/2017   Procedure: FLEXIBLE SIGMOIDOSCOPY;  Surgeon: Christene Lye, MD;  Location: [facility] ENDOSCOPY;  Service: Endoscopy;  Laterality: N/A;  . PORTACATH PLACEMENT N/A 06/17/2018   Procedure: INSERTION PORT-A-CATH, WITH FLUOROSCOPY;  Surgeon: [name], MD;  Location: [facility] ORS;  Service: General;  Laterality: N/A;    Social History   Socioeconomic History  . Marital status: Married    Spouse name: Not on file  . Number of children: Not on file  . Years of education: Not on file  . Highest education level: Not on file  Occupational History  . Not on file  Social Needs  . Financial resource strain: Not on file  . Food insecurity:    Worry: Not  on file    Inability: Not on file  . Transportation needs:    Medical: Not on file    Non-medical: Not on file  Tobacco Use  . Smoking status: Never Smoker  . Smokeless tobacco: Never Used  Substance and Sexual Activity  . Alcohol use: No  . Drug use: No  . Sexual activity: Not Currently  Lifestyle  . Physical activity:    Days per week: Not on file    Minutes per session: Not on file  . Stress: Not on file  Relationships  . Social connections:    Talks on phone: Not on file    Gets together: Not on file    Attends religious service: Not on file    Active member of club or organization: Not on file    Attends meetings of clubs or organizations: Not on file    Relationship status: Not on file  . Intimate partner violence:    Fear of current or ex partner: Not on file    Emotionally abused: Not on file    Physically abused: Not on file    Forced sexual activity: Not on file  Other Topics Concern  . Not on file  Social History Narrative  . Not on file    Family History  Problem Relation Age of Onset  . Diabetes Mother   .  AAA (abdominal aortic aneurysm) Mother   . Coronary artery disease Mother   . Diabetes Father   . Coronary artery disease Father   . Dementia Father   . Breast cancer Sister      Current Outpatient Medications:  .  aspirin EC 81 MG tablet, Take 81 mg by mouth daily., Disp: , Rfl:  .  glimepiride (AMARYL) 2 MG tablet, Take 2 mg by mouth daily with supper. , Disp: , Rfl:  .  loratadine (CLARITIN) 10 MG tablet, Take 10 mg by mouth daily., Disp: , Rfl:  .  losartan (COZAAR) 25 MG tablet, Take 25 mg by mouth daily., Disp: , Rfl:  .  metFORMIN (GLUCOPHAGE) 1000 MG tablet, Take 1,000-1,500 tablets by mouth See admin instructions. TAKE 1 TABLET (1000 MG) BY MOUTH IN THE MORNING & TAKE 1.5 TABLETS (1500 MG) BY MOUTH AT SUPPER, Disp: , Rfl:  .  pravastatin (PRAVACHOL) 20 MG tablet, Take 20 mg by mouth daily with supper. , Disp: , Rfl:  .  acetaminophen  (TYLENOL) 500 MG tablet, Take 500 mg by mouth every 6 (six) hours as needed (for pain.)., Disp: , Rfl:  .  dexamethasone (DECADRON) 4 MG tablet, Take 2 tablets (8 mg total) by mouth daily. Start the day after chemotherapy for 2 days. Take with food. (Patient not taking: Reported on 06/22/2018), Disp: 30 tablet, Rfl: 1 .  HYDROcodone-Acetaminophen (VICODIN) 5-300 MG TABS, Take 1 tablet by mouth every 4 (four) hours as needed. (Patient not taking: Reported on 06/22/2018), Disp: 15 each, Rfl: 0 .  lidocaine-prilocaine (EMLA) cream, Place a small amount over cream over port 1 hour before each treatment, cover cream with saran wrap for clothing protection (Patient not taking: Reported on 06/17/2018), Disp: 30 g, Rfl: 1 .  LORazepam (ATIVAN) 0.5 MG tablet, Take 1 tablet (0.5 mg total) by mouth every 6 (six) hours as needed (Nausea or vomiting). (Patient not taking: Reported on 06/22/2018), Disp: 30 tablet, Rfl: 0 .  ondansetron (ZOFRAN) 8 MG tablet, Take 1 tablet (8 mg total) by mouth 2 (two) times daily as needed for refractory nausea / vomiting. Start on day 3 after chemotherapy. (Patient not taking: Reported on 06/22/2018), Disp: 30 tablet, Rfl: 1 .  prochlorperazine (COMPAZINE) 10 MG tablet, Take 1 tablet (10 mg total) by mouth every 6 (six) hours as needed (Nausea or vomiting). (Patient not taking: Reported on 06/22/2018), Disp: 30 tablet, Rfl: 1 .  triamcinolone (KENALOG) 0.025 % cream, Apply 1 application topically daily as needed (FOR EZCEMA). , Disp: , Rfl:  No current facility-administered medications for this visit.   Facility-Administered Medications Ordered in Other Visits:  .  [COMPLETED] dexamethasone (DECADRON) injection 10 mg, 10 mg, Intravenous, Once, Sindy Guadeloupe, MD, 10 mg at 06/22/18 1015 .  fluorouracil (ADRUCIL) 4,850 mg in sodium chloride 0.9 % 53 mL chemo infusion, 2,400 mg/m2 (Treatment Plan Recorded), Intravenous, 1 day or 1 dose, Sindy Guadeloupe, MD .  fluorouracil (ADRUCIL) chemo  injection 800 mg, 400 mg/m2 (Treatment Plan Recorded), Intravenous, Once, Sindy Guadeloupe, MD .  leucovorin 800 mg in dextrose 5 % 250 mL infusion, 800 mg, Intravenous, Once, Sindy Guadeloupe, MD .  oxaliplatin (ELOXATIN) 170 mg in dextrose 5 % 500 mL chemo infusion, 85 mg/m2 (Treatment Plan Recorded), Intravenous, Once, Sindy Guadeloupe, MD  Physical exam:  Vitals:   06/22/18 0851  BP: (!) 150/78  Pulse: 82  Resp: 18  Temp: 98 F (36.7 C)  TempSrc: Tympanic  Weight: 192  lb 11.2 oz (87.4 kg)  Height: '5\' 6"'  (1.676 m)   Physical Exam  Constitutional: He is oriented to person, place, and time. He appears well-developed and well-nourished.  HENT:  Head: Normocephalic and atraumatic.  Eyes: Pupils are equal, round, and reactive to light. EOM are normal.  Neck: Normal range of motion.  Cardiovascular: Normal rate, regular rhythm and normal heart sounds.  Pulmonary/Chest: Effort normal and breath sounds normal.  Abdominal: Soft. Bowel sounds are normal.  Neurological: He is alert and oriented to person, place, and time.  Skin: Skin is warm and dry.     CMP Latest Ref Rng & Units 06/18/2018  Glucose 70 - 99 mg/dL 305(H)  BUN 8 - 23 mg/dL 20  Creatinine 0.61 - 1.24 mg/dL 1.26(H)  Sodium 135 - 145 mmol/L 135  Potassium 3.5 - 5.1 mmol/L 4.2  Chloride 98 - 111 mmol/L 102  CO2 22 - 32 mmol/L 20(L)  Calcium 8.9 - 10.3 mg/dL 9.3  Total Protein 6.5 - 8.1 g/dL 8.0  Total Bilirubin 0.3 - 1.2 mg/dL 0.4  Alkaline Phos 38 - 126 U/L 64  AST 15 - 41 U/L 22  ALT 0 - 44 U/L 22   CBC Latest Ref Rng & Units 06/18/2018  WBC 3.8 - 10.6 K/uL 12.4(H)  Hemoglobin 13.0 - 18.0 g/dL 14.4  Hematocrit 40.0 - 52.0 % 42.5  Platelets 150 - 440 K/uL 289    No images are attached to the encounter.  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 EXAM: CT CHEST, ABDOMEN, AND  PELVIS WITH CONTRAST TECHNIQUE: Multidetector CT imaging of the chest, abdomen and pelvis was performed following the standard protocol during bolus administration of intravenous contrast. CONTRAST:  165m OMNIPAQUE IOHEXOL 300 MG/ML  SOLN COMPARISON:  CT 03/16/2018.  PET-CT 03/19/2018. FINDINGS: CT CHEST FINDINGS Cardiovascular: The heart size is normal. No substantial pericardial effusion. Coronary artery calcification is evident. Atherosclerotic calcification is noted in the wall of the thoracic aorta. Left Port-A-Cath tip is in the upper right atrium. Mediastinum/Nodes: No mediastinal lymphadenopathy. There is no hilar lymphadenopathy. The esophagus has normal imaging features. There is no axillary lymphadenopathy. Lungs/Pleura: The central tracheobronchial airways are patent. Bilateral pulmonary nodules again identified. 9 mm left upper lobe nodule (4:60) was 7 mm when I remeasure in a similar fashion on the prior study. 14 mm nodule in the lingula is stable at 14 mm today (4:84). 11 mm right lower lobe nodule (4:104) was 8 mm when I remeasure in a similar fashion on prior study. Musculoskeletal: No worrisome lytic or sclerotic osseous abnormality. CT ABDOMEN PELVIS FINDINGS Hepatobiliary: Tiny hypodensity in the posterior right dome of liver (2:48) is unchanged. Calcified gallstones measure up to 7 mm. No intrahepatic or extrahepatic biliary dilation. Pancreas: No focal mass lesion. No dilatation of the main duct. No intraparenchymal cyst. No peripancreatic edema. Spleen: No splenomegaly. No focal mass lesion. Adrenals/Urinary Tract: No adrenal nodule or mass. 2 mm nonobstructing stone identified upper pole right kidney. Left kidney unremarkable. No evidence for hydroureter. The urinary bladder appears normal for the degree of distention. Stomach/Bowel: Stomach is nondistended. No gastric wall thickening. No evidence of outlet obstruction. Duodenum is normally positioned as is the ligament of Treitz. No small  bowel wall thickening. No small bowel dilatation. The terminal ileum is normal. The appendix is normal. Diverticuli are seen scattered along the entire length of  the colon without CT findings of diverticulitis. Suture line noted proximal sigmoid colon. Vascular/Lymphatic: There is abdominal aortic atherosclerosis without aneurysm. Stable appearance upper normal hepato duodenal ligament lymph nodes. No retroperitoneal lymphadenopathy. No pelvic sidewall lymphadenopathy. Reproductive: Dystrophic calcification noted central prostate gland. Other: No intraperitoneal free fluid. Musculoskeletal: Small bilateral groin hernias contain only fat. No worrisome lytic or sclerotic osseous abnormality. IMPRESSION: 1. Continued increase in size of bilateral pulmonary nodules, consistent with metastatic disease. 2. No new or progressive findings in the abdomen or pelvis to suggest metastatic disease below the hemidiaphragms. 3. Diffuse colonic diverticulosis without diverticulitis. 4. Cholelithiasis. 5. Nonobstructing tiny stone upper pole right kidney. 6.  Aortic Atherosclerois (ICD10-170.0) Electronically Signed   By: Misty Stanley M.D.   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 EXAM: CT CHEST, ABDOMEN, AND PELVIS WITH CONTRAST TECHNIQUE: Multidetector CT imaging of the chest, abdomen and pelvis was performed following the standard protocol during bolus administration of intravenous contrast. CONTRAST:  139m OMNIPAQUE IOHEXOL 300 MG/ML  SOLN COMPARISON:  CT 03/16/2018.  PET-CT 03/19/2018. FINDINGS: CT CHEST FINDINGS Cardiovascular: The heart size is normal. No substantial pericardial effusion. Coronary artery calcification is evident. Atherosclerotic calcification is noted in the wall of the thoracic aorta. Left Port-A-Cath tip is in the upper right  atrium. Mediastinum/Nodes: No mediastinal lymphadenopathy. There is no hilar lymphadenopathy. The esophagus has normal imaging features. There is no axillary lymphadenopathy. Lungs/Pleura: The central tracheobronchial airways are patent. Bilateral pulmonary nodules again identified. 9 mm left upper lobe nodule (4:60) was 7 mm when I remeasure in a similar fashion on the prior study. 14 mm nodule in the lingula is stable at 14 mm today (4:84). 11 mm right lower lobe nodule (4:104) was 8 mm when I remeasure in a similar fashion on prior study. Musculoskeletal: No worrisome lytic or sclerotic osseous abnormality. CT ABDOMEN PELVIS FINDINGS Hepatobiliary: Tiny hypodensity in the posterior right dome of liver (2:48) is unchanged. Calcified gallstones measure up to 7 mm. No intrahepatic or extrahepatic biliary dilation. Pancreas: No focal mass lesion. No dilatation of the main duct. No intraparenchymal cyst. No peripancreatic edema. Spleen: No splenomegaly. No focal mass lesion. Adrenals/Urinary Tract: No adrenal nodule or mass. 2 mm nonobstructing stone identified upper pole right kidney. Left kidney unremarkable. No evidence for hydroureter. The urinary bladder appears normal for the degree of distention. Stomach/Bowel: Stomach is nondistended. No gastric wall thickening. No evidence of outlet obstruction. Duodenum is normally positioned as is the ligament of Treitz. No small bowel wall thickening. No small bowel dilatation. The terminal ileum is normal. The appendix is normal. Diverticuli are seen scattered along the entire length of the colon without CT findings of diverticulitis. Suture line noted proximal sigmoid colon. Vascular/Lymphatic: There is abdominal aortic atherosclerosis without aneurysm. Stable appearance upper normal hepato duodenal ligament lymph nodes. No retroperitoneal lymphadenopathy. No pelvic sidewall lymphadenopathy. Reproductive: Dystrophic calcification noted central prostate gland. Other: No  intraperitoneal free fluid. Musculoskeletal: Small bilateral groin hernias contain only fat. No worrisome lytic or sclerotic osseous abnormality. IMPRESSION: 1. Continued increase in size of bilateral pulmonary nodules, consistent with metastatic disease. 2. No new or progressive findings in the abdomen or pelvis to suggest metastatic disease below the hemidiaphragms. 3. Diffuse colonic diverticulosis without diverticulitis. 4. Cholelithiasis. 5. Nonobstructing tiny stone upper pole right kidney. 6.  Aortic Atherosclerois (ICD10-170.0) Electronically Signed  By: Misty Stanley M.D.   On: 06/18/2018 14:12   Dg Chest Port 1 View  Result Date: 06/17/2018 CLINICAL DATA:  For central line placement EXAM: PORTABLE CHEST 1 VIEW COMPARISON:  Is 04/01/2018 FINDINGS: Patient has a LEFT-sided subclavian Port-A-Cath, tip overlying the level of the LOWER superior vena cava/RIGHT atrial junction. Heart size is normal. Shallow lung inflation. No pneumothorax. IMPRESSION: Bibasilar atelectasis. LEFT subclavian Port-A-Cath.  No pneumothorax. Electronically Signed   By: Nolon Nations M.D.   On: 06/17/2018 17:00   Dg C-arm 1-60 Min-no Report  Result Date: 06/17/2018 Fluoroscopy was utilized by the requesting physician.  No radiographic interpretation.     Assessment and plan- Patient is a 77 y.o. male with Stage IV colon adenocarcinoma with lung metastases.  He is here for on treatment assessment prior to cycle #1 of FOLFOX Avastin chemotherapy  Today his blood pressure is 150/78 and patient reports that his blood pressure at home has been between 140s to 150s for the most part.  Also when he came to get his CT scans he had a CMP done which showed that his blood sugars were 305.  We will touch base with Dr. Netty Starring his primary care doctor to see if we can get a better control of his blood pressure and also preferably his blood sugars before I can proceed with a Avastin given the risk of heart attacks and strokes  associated with a Avastin which can be worsened in the face of uncontrolled hypertension.  He will therefore not be getting a Avastin today  I have reviewed CT chest abdomen and pelvis images independently and discussed findings with the patient.  Overall he has low volume disease and at that has been a slow steady growth of his lung nodules over the last 1 year.  We are therefore to proceed with palliative FOLFOX chemotherapy today.  He has had chemo teach and port placed.  Again discussed risks and benefits of chemotherapy including all but not limited to nausea, vomiting, fatigue, risk of infections and hospitalization.  Risk of peripheral neuropathy associated with oxaliplatin.  Patient understands and agrees to proceed.  We will plan to repeat scans after about 6 cycles.  If he has a dramatic response after 12 cycles of FOLFOX we could potentially consider giving him treatment break or switching him to Xeloda which he has tolerated well in the past to maintain his quality of life  I will see him back in 2 weeks time with CBC CMP for cycle #2 of FOLFOX chemotherapy plus minus Avastin.  Baseline CEA has never been elevated   Visit Diagnosis 1. Malignant neoplasm metastatic to lung, unspecified laterality ([place])   2. 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[place])   3. Encounter for antineoplastic chemotherapy      Dr. Randa Evens, MD, MPH [facility] [phone] 06/22/2018 10:13 AM

## 2018-06-22 NOTE — Progress Notes (Signed)
No new changes noted today 

## 2018-06-24 ENCOUNTER — Inpatient Hospital Stay: Payer: Medicare Other

## 2018-06-24 VITALS — BP 137/86 | HR 76 | Temp 97.8°F | Resp 18

## 2018-06-24 DIAGNOSIS — C189 Malignant neoplasm of colon, unspecified: Secondary | ICD-10-CM

## 2018-06-24 DIAGNOSIS — Z5112 Encounter for antineoplastic immunotherapy: Secondary | ICD-10-CM | POA: Diagnosis not present

## 2018-06-24 DIAGNOSIS — C7800 Secondary malignant neoplasm of unspecified lung: Secondary | ICD-10-CM

## 2018-06-24 MED ORDER — SODIUM CHLORIDE 0.9% FLUSH
10.0000 mL | INTRAVENOUS | Status: DC | PRN
  Administered 2018-06-24: 10 mL
  Filled 2018-06-24: qty 10

## 2018-06-24 MED ORDER — HEPARIN SOD (PORK) LOCK FLUSH 100 UNIT/ML IV SOLN
INTRAVENOUS | Status: AC
  Filled 2018-06-24: qty 5

## 2018-06-24 MED ORDER — HEPARIN SOD (PORK) LOCK FLUSH 100 UNIT/ML IV SOLN
500.0000 [IU] | Freq: Once | INTRAVENOUS | Status: AC | PRN
  Administered 2018-06-24: 500 [IU]

## 2018-06-28 ENCOUNTER — Ambulatory Visit (INDEPENDENT_AMBULATORY_CARE_PROVIDER_SITE_OTHER): Payer: Medicare Other | Admitting: Surgery

## 2018-06-28 ENCOUNTER — Encounter: Payer: Self-pay | Admitting: Surgery

## 2018-06-28 VITALS — BP 138/76 | HR 80 | Temp 97.9°F | Wt 188.0 lb

## 2018-06-28 DIAGNOSIS — C186 Malignant neoplasm of descending colon: Secondary | ICD-10-CM

## 2018-06-28 NOTE — Patient Instructions (Signed)
Please give us a call in case you have any questions or concerns.  

## 2018-06-28 NOTE — Progress Notes (Signed)
Outpatient postop visit  06/28/2018  Miguel Flores is an 77 y.o. male.    Procedure: Port placement  CC: Problems  HPI: Patient status post port placement for metastatic colon cancer to lungs.  Patient had chemotherapy and a blood draw from this last week.  He has no problems.  Medications reviewed.    Physical Exam:  There were no vitals taken for this visit.    PE: Incisions clean and dry without erythema.  Steri-Strips in place.    Assessment/Plan:  Status post port placement.  Patient doing very well recommend follow-up on an as-needed basis. [name], MD, FACS

## 2018-07-05 NOTE — Progress Notes (Signed)
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 Patient is a 77 year old male who presented with evidence of bowel obstruction on 02/06/2017. At that time he had progressive abdominal distention and no bowel movement for about one week. CT abdomen showed an apple core lesion in the descending/sigmoid colon.  2. Patient underwent Hartmann's procedure for obstructing sigmoid colon mass on 02/06/2017. Preoperative CEA was 5.0  3. 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 It is 0 out of 15 lymph nodes were positive for malignancy. Perineural and lymphovascular invasion was present. Margins were negative.pT3N0. MMR stable.  4. Given that he had high risk stage II colon cancer including he presented with obstruction and has LVI and perineural invasion- adjuvant xeloda chemotherapy was recommended for 6 months.   5.Patient had evidence of hand foot syndrome after cycle 3. Cycle 4 delayed by 1 week. Topical urea cream prescribed  6. Patient completed 8 cycles of adjuvant xeloda in October 2018. polps seen in sigmoid and decending colon but negative for malignancy  7. Repeat Ct abdomen after 8 cycles showed no metastatic disease. 6 mm lung nodule noted in RLL  8.Patient had a repeat colonoscopy in October 2018 which showed polyps in his transverse and descending colon which were taken out and were negative for  high-grade dysplasia or malignancy. Patient subsequently underwent colostomy takedown procedure by Dr. Burt Knack  9. Repeat CT chest abdomen/ pelvis showed increase in the size of previously seen lung nodules largest being 1.4 cm consistent with metastatic disease. PET/CT showed mild uptake in 2 lung nodules. Others were not hypermetabolic. No other evidence of metastatic disease  10. Repeat lung biopsy consistent with colon adenocarcinoma. 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  Given the slow rate of growth of his colon cancer and desire to maintain his quality of life plan was to start chemotherapy after he gets a 66-monthbreak in summer.  Repeat CT chest abdomen and pelvis in August 2019 showed mild increase in the size of his lung nodules.  No new lung nodules are no new sites of distant metastatic disease.  FOLFOX Avastin chemotherapy started on 06/22/2018  Interval history-he tolerated chemotherapy well except for mild fatigue.  He had one self-limited episode of nausea.  Denies other complaints today states that he has been keeping a tab of his blood pressure readings at home which have been mostly in the 130s.  He has not had any high readings.  In terms of his blood sugar control he went up on his glimepiride dose from 2 mg to 8 mg just a day ago.  ECOG PS- 0 Pain scale- 0 Opioid associated constipation- no  Review of systems- Review of Systems  Constitutional: Positive for malaise/fatigue. Negative for chills, fever and weight loss.  HENT: Negative for congestion, ear discharge and nosebleeds.   Eyes: Negative for blurred vision.  Respiratory: Negative for cough, hemoptysis, sputum production, shortness of breath and wheezing.   Cardiovascular: Negative for chest pain, palpitations, orthopnea and  claudication.  Gastrointestinal: Negative for abdominal pain, blood in stool, constipation, diarrhea, heartburn, melena, nausea and vomiting.  Genitourinary: Negative for  dysuria, flank pain, frequency, hematuria and urgency.  Musculoskeletal: Negative for back pain, joint pain and myalgias.  Skin: Negative for rash.  Neurological: Negative for dizziness, tingling, focal weakness, seizures, weakness and headaches.  Endo/Heme/Allergies: Does not bruise/bleed easily.  Psychiatric/Behavioral: Negative for depression and suicidal ideas. The patient does not have insomnia.       No Known Allergies   Past Medical History:  Diagnosis Date  . Cancer ([place])    skin cancer-nose w graft,also shoulder- basal cell per pt  . Cataract    r eye  . Chronic kidney disease    kidney stones 20 y ago per pt  . Colon cancer ([place])   . Diabetes mellitus without complication ([place])   . Eczema   . History of kidney stones   . Hyperlipidemia   . Hypertension   . Vertigo      Past Surgical History:  Procedure Laterality Date  . CATARACT EXTRACTION Left   . COLECTOMY WITH COLOSTOMY CREATION/HARTMANN PROCEDURE N/A 02/06/2017   Procedure: COLECTOMY WITH COLOSTOMY CREATION/HARTMANN PROCEDURE;  Surgeon: [name], MD;  Location: [facility] ORS;  Service: General;  Laterality: N/A;  . COLON SURGERY    . COLONOSCOPY WITH PROPOFOL N/A 09/04/2017   Procedure: COLONOSCOPY WITH PROPOFOL;  Surgeon: Jonathon Bellows, MD;  Location: [facility] ENDOSCOPY;  Service: Gastroenterology;  Laterality: N/A;  . COLOSTOMY CLOSURE N/A 10/20/2017   Procedure: COLOSTOMY CLOSURE;  Surgeon: [name], MD;  Location: [facility] ORS;  Service: General;  Laterality: N/A;  . CYSTOSCOPY    . FLEXIBLE SIGMOIDOSCOPY N/A 02/06/2017   Procedure: FLEXIBLE SIGMOIDOSCOPY;  Surgeon: Christene Lye, MD;  Location: [facility] ENDOSCOPY;  Service: Endoscopy;  Laterality: N/A;  . PORTACATH PLACEMENT N/A 06/17/2018   Procedure: INSERTION PORT-A-CATH, WITH FLUOROSCOPY;  Surgeon: [name], MD;  Location: [facility] ORS;  Service: General;  Laterality: N/A;    Social History   Socioeconomic History  . Marital status:  Married    Spouse name: Not on file  . Number of children: Not on file  . Years of education: Not on file  . Highest education level: Not on file  Occupational History  . Not on file  Social Needs  . Financial resource strain: Not on file  . Food insecurity:    Worry: Not on file    Inability: Not on file  . Transportation needs:    Medical: Not on file    Non-medical: Not on file  Tobacco Use  . Smoking status: Never Smoker  . Smokeless tobacco: Never Used  Substance and Sexual Activity  . Alcohol use: No  . Drug use: No  . Sexual activity: Not Currently  Lifestyle  . Physical activity:    Days per week: Not on file    Minutes per session: Not on file  . Stress: Not on file  Relationships  . Social connections:    Talks on phone: Not on file    Gets together: Not on file    Attends religious service: Not on file    Active member of club or organization: Not on file    Attends meetings of clubs or organizations: Not on file    Relationship status: Not on file  . Intimate partner violence:    Fear of current or ex partner: Not on file    Emotionally abused: Not on file  Physically abused: Not on file    Forced sexual activity: Not on file  Other Topics Concern  . Not on file  Social History Narrative  . Not on file    Family History  Problem Relation Age of Onset  . Diabetes Mother   . AAA (abdominal aortic aneurysm) Mother   . Coronary artery disease Mother   . Diabetes Father   . Coronary artery disease Father   . Dementia Father   . Breast cancer Sister      Current Outpatient Medications:  .  acetaminophen (TYLENOL) 500 MG tablet, Take 500 mg by mouth every 6 (six) hours as needed (for pain.)., Disp: , Rfl:  .  aspirin EC 81 MG tablet, Take 81 mg by mouth daily., Disp: , Rfl:  .  glimepiride (AMARYL) 2 MG tablet, Take 2 mg by mouth daily with supper. , Disp: , Rfl:  .  HYDROcodone-Acetaminophen (VICODIN) 5-300 MG TABS, Take 1 tablet by mouth every 4  (four) hours as needed., Disp: 15 each, Rfl: 0 .  lidocaine-prilocaine (EMLA) cream, Place a small amount over cream over port 1 hour before each treatment, cover cream with saran wrap for clothing protection, Disp: 30 g, Rfl: 1 .  loratadine (CLARITIN) 10 MG tablet, Take 10 mg by mouth daily., Disp: , Rfl:  .  losartan (COZAAR) 25 MG tablet, Take 25 mg by mouth daily., Disp: , Rfl:  .  metFORMIN (GLUCOPHAGE) 1000 MG tablet, Take 1,000-1,500 tablets by mouth See admin instructions. TAKE 1 TABLET (1000 MG) BY MOUTH IN THE MORNING & TAKE 1.5 TABLETS (1500 MG) BY MOUTH AT SUPPER, Disp: , Rfl:  .  ondansetron (ZOFRAN) 8 MG tablet, Take 1 tablet (8 mg total) by mouth 2 (two) times daily as needed for refractory nausea / vomiting. Start on day 3 after chemotherapy., Disp: 30 tablet, Rfl: 1 .  pravastatin (PRAVACHOL) 20 MG tablet, Take 20 mg by mouth daily with supper. , Disp: , Rfl:  .  prochlorperazine (COMPAZINE) 10 MG tablet, Take 1 tablet (10 mg total) by mouth every 6 (six) hours as needed (Nausea or vomiting)., Disp: 30 tablet, Rfl: 1 .  triamcinolone (KENALOG) 0.025 % cream, Apply 1 application topically daily as needed (FOR EZCEMA). , Disp: , Rfl:   Physical exam: There were no vitals filed for this visit. Physical Exam  Constitutional: He is oriented to person, place, and time. He appears well-developed and well-nourished.  HENT:  Head: Normocephalic and atraumatic.  Eyes: Pupils are equal, round, and reactive to light. EOM are normal.  Neck: Normal range of motion.  Cardiovascular: Normal rate, regular rhythm and normal heart sounds.  Pulmonary/Chest: Effort normal and breath sounds normal.  Abdominal: Soft. Bowel sounds are normal.  Musculoskeletal: He exhibits no edema.  Neurological: He is alert and oriented to person, place, and time.  Skin: Skin is warm and dry.     CMP Latest Ref Rng & Units 07/06/2018  Glucose 70 - 99 mg/dL 293(H)  BUN 8 - 23 mg/dL 17  Creatinine 0.61 - 1.24  mg/dL 1.17  Sodium 135 - 145 mmol/L 138  Potassium 3.5 - 5.1 mmol/L 4.0  Chloride 98 - 111 mmol/L 106  CO2 22 - 32 mmol/L 21(L)  Calcium 8.9 - 10.3 mg/dL 9.1  Total Protein 6.5 - 8.1 g/dL 6.9  Total Bilirubin 0.3 - 1.2 mg/dL 0.4  Alkaline Phos 38 - 126 U/L 64  AST 15 - 41 U/L 30  ALT 0 - 44 U/L 18  CBC Latest Ref Rng & Units 07/06/2018  WBC 3.8 - 10.6 K/uL 4.5  Hemoglobin 13.0 - 18.0 g/dL 13.2  Hematocrit 40.0 - 52.0 % 39.3(L)  Platelets 150 - 440 K/uL 232    No images are attached to the encounter.  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 EXAM: CT CHEST, ABDOMEN, AND PELVIS WITH CONTRAST TECHNIQUE: Multidetector CT imaging of the chest, abdomen and pelvis was performed following the standard protocol during bolus administration of intravenous contrast. CONTRAST:  119m OMNIPAQUE IOHEXOL 300 MG/ML  SOLN COMPARISON:  CT 03/16/2018.  PET-CT 03/19/2018. FINDINGS: CT CHEST FINDINGS Cardiovascular: The heart size is normal. No substantial pericardial effusion. Coronary artery calcification is evident. Atherosclerotic calcification is noted in the wall of the thoracic aorta. Left Port-A-Cath tip is in the upper right atrium. Mediastinum/Nodes: No mediastinal lymphadenopathy. There is no hilar lymphadenopathy. The esophagus has normal imaging features. There is no axillary lymphadenopathy. Lungs/Pleura: The central tracheobronchial airways are patent. Bilateral pulmonary nodules again identified. 9 mm left upper lobe nodule (4:60) was 7 mm when I remeasure in a similar fashion on the prior study. 14 mm nodule in the lingula is stable at 14 mm today (4:84). 11 mm right lower lobe nodule (4:104) was 8 mm when I remeasure in a similar fashion on prior study. Musculoskeletal: No worrisome lytic or sclerotic osseous abnormality. CT ABDOMEN PELVIS FINDINGS  Hepatobiliary: Tiny hypodensity in the posterior right dome of liver (2:48) is unchanged. Calcified gallstones measure up to 7 mm. No intrahepatic or extrahepatic biliary dilation. Pancreas: No focal mass lesion. No dilatation of the main duct. No intraparenchymal cyst. No peripancreatic edema. Spleen: No splenomegaly. No focal mass lesion. Adrenals/Urinary Tract: No adrenal nodule or mass. 2 mm nonobstructing stone identified upper pole right kidney. Left kidney unremarkable. No evidence for hydroureter. The urinary bladder appears normal for the degree of distention. Stomach/Bowel: Stomach is nondistended. No gastric wall thickening. No evidence of outlet obstruction. Duodenum is normally positioned as is the ligament of Treitz. No small bowel wall thickening. No small bowel dilatation. The terminal ileum is normal. The appendix is normal. Diverticuli are seen scattered along the entire length of the colon without CT findings of diverticulitis. Suture line noted proximal sigmoid colon. Vascular/Lymphatic: There is abdominal aortic atherosclerosis without aneurysm. Stable appearance upper normal hepato duodenal ligament lymph nodes. No retroperitoneal lymphadenopathy. No pelvic sidewall lymphadenopathy. Reproductive: Dystrophic calcification noted central prostate gland. Other: No intraperitoneal free fluid. Musculoskeletal: Small bilateral groin hernias contain only fat. No worrisome lytic or sclerotic osseous abnormality. IMPRESSION: 1. Continued increase in size of bilateral pulmonary nodules, consistent with metastatic disease. 2. No new or progressive findings in the abdomen or pelvis to suggest metastatic disease below the hemidiaphragms. 3. Diffuse colonic diverticulosis without diverticulitis. 4. Cholelithiasis. 5. Nonobstructing tiny stone upper pole right kidney. 6.  Aortic Atherosclerois (ICD10-170.0) Electronically Signed   By: EMisty StanleyM.D.   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 EXAM: CT CHEST, ABDOMEN, AND PELVIS WITH CONTRAST TECHNIQUE: Multidetector CT imaging of the chest, abdomen and pelvis was performed following the standard protocol during bolus administration of intravenous contrast. CONTRAST:  1061mOMNIPAQUE IOHEXOL 300 MG/ML  SOLN COMPARISON:  CT 03/16/2018.  PET-CT 03/19/2018. FINDINGS: CT CHEST FINDINGS Cardiovascular: The heart size is normal. No substantial pericardial effusion. Coronary artery calcification is evident. Atherosclerotic calcification is noted in the wall of the thoracic aorta. Left Port-A-Cath tip is in the upper right atrium. Mediastinum/Nodes: No mediastinal lymphadenopathy. There is no hilar lymphadenopathy. The esophagus has normal imaging features. There is no axillary lymphadenopathy. Lungs/Pleura: The central tracheobronchial airways are patent. Bilateral pulmonary nodules again identified. 9 mm left upper lobe nodule (4:60) was 7 mm when I remeasure in a similar fashion on the prior study. 14 mm nodule in the lingula is stable at 14 mm today (4:84). 11 mm right lower lobe nodule (4:104) was 8 mm when I remeasure in a similar fashion on prior study. Musculoskeletal: No worrisome lytic or sclerotic osseous abnormality. CT ABDOMEN PELVIS FINDINGS Hepatobiliary: Tiny hypodensity in the posterior right dome of liver (2:48) is unchanged. Calcified gallstones measure up to 7 mm. No intrahepatic or extrahepatic biliary dilation. Pancreas: No focal mass lesion. No dilatation of the main duct. No intraparenchymal cyst. No peripancreatic edema. Spleen: No splenomegaly. No focal mass lesion. Adrenals/Urinary Tract: No adrenal nodule or mass. 2 mm nonobstructing stone identified upper pole right kidney. Left kidney unremarkable. No evidence for hydroureter. The urinary bladder appears normal  for the degree of distention. Stomach/Bowel: Stomach is nondistended. No gastric wall thickening. No evidence of outlet obstruction. Duodenum is normally positioned as is the ligament of Treitz. No small bowel wall thickening. No small bowel dilatation. The terminal ileum is normal. The appendix is normal. Diverticuli are seen scattered along the entire length of the colon without CT findings of diverticulitis. Suture line noted proximal sigmoid colon. Vascular/Lymphatic: There is abdominal aortic atherosclerosis without aneurysm. Stable appearance upper normal hepato duodenal ligament lymph nodes. No retroperitoneal lymphadenopathy. No pelvic sidewall lymphadenopathy. Reproductive: Dystrophic calcification noted central prostate gland. Other: No intraperitoneal free fluid. Musculoskeletal: Small bilateral groin hernias contain only fat. No worrisome lytic or sclerotic osseous abnormality. IMPRESSION: 1. Continued increase in size of bilateral pulmonary nodules, consistent with metastatic disease. 2. No new or progressive findings in the abdomen or pelvis to suggest metastatic disease below the hemidiaphragms. 3. Diffuse colonic diverticulosis without diverticulitis. 4. Cholelithiasis. 5. Nonobstructing tiny stone upper pole right kidney. 6.  Aortic Atherosclerois (ICD10-170.0) Electronically Signed   By: Misty Stanley M.D.   On: 06/18/2018 14:12   Dg Chest Port 1 View  Result Date: 06/17/2018 CLINICAL DATA:  For central line placement EXAM: PORTABLE CHEST 1 VIEW COMPARISON:  Is 04/01/2018 FINDINGS: Patient has a LEFT-sided subclavian Port-A-Cath, tip overlying the level of the LOWER superior vena cava/RIGHT atrial junction. Heart size is normal. Shallow lung inflation. No pneumothorax. IMPRESSION: Bibasilar atelectasis. LEFT subclavian Port-A-Cath.  No pneumothorax. Electronically Signed   By: Nolon Nations M.D.   On: 06/17/2018 17:00   Dg C-arm 1-60 Min-no Report  Result Date: 06/17/2018 Fluoroscopy was  utilized by the requesting physician.  No radiographic interpretation.     Assessment and plan- Patient is a 77 y.o. male with Stage IV colon adenocarcinoma with lung metastases.    He is here for on treatment assessment prior to cycle 2 of FOLFOX Avastin chemotherapy  Counts are okay to proceed with cycle 2 of FOLFOX Avastin chemotherapy today.  His blood pressure on repeat check showed a systolic reading of 244 and urine shows +1 protein.  We will continue to monitor this but okay to proceed with a Avastin today   Patient has been compliant with his treatment  so far and would be reliable to keep his blood pressure tab at home.  His home blood pressure readings have been in the 130's. I will look at his blood pressure readings at his next visit and decide if he needs to be on any other antihypertensive medications which can be coordinated by Dr. Netty Starring since he is receiving a Avastin.    I will see him back in 2 weeks time with CBC and CMP prior to cycle #3.  Uncontrolled blood sugars: He recently went up on his dose of glimepiride yesterday.  If it continues to be an issue I will touch base with Dr. Netty Starring   Visit Diagnosis 1. 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[place])   2. Encounter for antineoplastic chemotherapy      Dr. Randa Evens, MD, MPH [facility] [phone] 07/06/2018 10:30 AM

## 2018-07-06 ENCOUNTER — Inpatient Hospital Stay (HOSPITAL_BASED_OUTPATIENT_CLINIC_OR_DEPARTMENT_OTHER): Payer: Medicare Other | Admitting: Oncology

## 2018-07-06 ENCOUNTER — Inpatient Hospital Stay: Payer: Medicare Other

## 2018-07-06 ENCOUNTER — Encounter: Payer: Self-pay | Admitting: Oncology

## 2018-07-06 VITALS — BP 157/77 | HR 73

## 2018-07-06 VITALS — BP 170/75 | HR 71 | Temp 98.3°F | Resp 18 | Ht 66.0 in | Wt 191.7 lb

## 2018-07-06 DIAGNOSIS — C189 Malignant neoplasm of colon, unspecified: Secondary | ICD-10-CM

## 2018-07-06 DIAGNOSIS — Z5111 Encounter for antineoplastic chemotherapy: Secondary | ICD-10-CM

## 2018-07-06 DIAGNOSIS — I10 Essential (primary) hypertension: Secondary | ICD-10-CM | POA: Diagnosis not present

## 2018-07-06 DIAGNOSIS — C7800 Secondary malignant neoplasm of unspecified lung: Secondary | ICD-10-CM

## 2018-07-06 DIAGNOSIS — Z5112 Encounter for antineoplastic immunotherapy: Secondary | ICD-10-CM | POA: Diagnosis not present

## 2018-07-06 DIAGNOSIS — R809 Proteinuria, unspecified: Secondary | ICD-10-CM

## 2018-07-06 DIAGNOSIS — E1165 Type 2 diabetes mellitus with hyperglycemia: Secondary | ICD-10-CM

## 2018-07-06 DIAGNOSIS — Z794 Long term (current) use of insulin: Secondary | ICD-10-CM

## 2018-07-06 DIAGNOSIS — C187 Malignant neoplasm of sigmoid colon: Secondary | ICD-10-CM

## 2018-07-06 LAB — COMPREHENSIVE METABOLIC PANEL
ALK PHOS: 64 U/L (ref 38–126)
ALT: 18 U/L (ref 0–44)
AST: 30 U/L (ref 15–41)
Albumin: 3.9 g/dL (ref 3.5–5.0)
Anion gap: 11 (ref 5–15)
BILIRUBIN TOTAL: 0.4 mg/dL (ref 0.3–1.2)
BUN: 17 mg/dL (ref 8–23)
CALCIUM: 9.1 mg/dL (ref 8.9–10.3)
CO2: 21 mmol/L — AB (ref 22–32)
CREATININE: 1.17 mg/dL (ref 0.61–1.24)
Chloride: 106 mmol/L (ref 98–111)
GFR calc Af Amer: >60 mL/min (ref >60)
GFR, EST NON AFRICAN AMERICAN: 58 mL/min — AB (ref >60)
Glucose, Bld: 293 mg/dL — ABNORMAL HIGH (ref 70–99)
Potassium: 4 mmol/L (ref 3.5–5.1)
Sodium: 138 mmol/L (ref 135–145)
TOTAL PROTEIN: 6.9 g/dL (ref 6.5–8.1)

## 2018-07-06 LAB — PROTEIN, URINE, RANDOM: TOTAL PROTEIN, URINE: 13 mg/dL

## 2018-07-06 LAB — CBC WITH DIFFERENTIAL/PLATELET
BASOS PCT: 1 %
Basophils Absolute: 0 10*3/uL (ref 0–0.1)
EOS ABS: 0.2 10*3/uL (ref 0–0.7)
Eosinophils Relative: 5 %
HCT: 39.3 % — ABNORMAL LOW (ref 40.0–52.0)
Hemoglobin: 13.2 g/dL (ref 13.0–18.0)
Lymphocytes Relative: 45 %
Lymphs Abs: 2.1 10*3/uL (ref 1.0–3.6)
MCH: 29.4 pg (ref 26.0–34.0)
MCHC: 33.6 g/dL (ref 32.0–36.0)
MCV: 87.6 fL (ref 80.0–100.0)
MONOS PCT: 9 %
Monocytes Absolute: 0.4 10*3/uL (ref 0.2–1.0)
NEUTROS PCT: 40 %
Neutro Abs: 1.8 10*3/uL (ref 1.4–6.5)
Platelets: 232 10*3/uL (ref 150–440)
RBC: 4.49 MIL/uL (ref 4.40–5.90)
RDW: 13.5 % (ref 11.5–14.5)
WBC: 4.5 10*3/uL (ref 3.8–10.6)

## 2018-07-06 MED ORDER — SODIUM CHLORIDE 0.9 % IV SOLN
Freq: Once | INTRAVENOUS | Status: AC
  Administered 2018-07-06: 10:00:00 via INTRAVENOUS
  Filled 2018-07-06: qty 250

## 2018-07-06 MED ORDER — SODIUM CHLORIDE 0.9 % IV SOLN
2400.0000 mg/m2 | INTRAVENOUS | Status: DC
  Administered 2018-07-06: 4850 mg via INTRAVENOUS
  Filled 2018-07-06: qty 97

## 2018-07-06 MED ORDER — HEPARIN SOD (PORK) LOCK FLUSH 100 UNIT/ML IV SOLN: 500.0000 [IU] | Freq: Once | INTRAVENOUS | Status: DC

## 2018-07-06 MED ORDER — LEUCOVORIN CALCIUM INJECTION 350 MG
800.0000 mg | Freq: Once | INTRAVENOUS | Status: AC
  Administered 2018-07-06: 800 mg via INTRAVENOUS
  Filled 2018-07-06: qty 25

## 2018-07-06 MED ORDER — SODIUM CHLORIDE 0.9% FLUSH
10.0000 mL | INTRAVENOUS | Status: DC | PRN
  Administered 2018-07-06: 10 mL via INTRAVENOUS
  Filled 2018-07-06: qty 10

## 2018-07-06 MED ORDER — OXALIPLATIN CHEMO INJECTION 100 MG/20ML
85.0000 mg/m2 | Freq: Once | INTRAVENOUS | Status: AC
  Administered 2018-07-06: 170 mg via INTRAVENOUS
  Filled 2018-07-06: qty 34

## 2018-07-06 MED ORDER — DEXTROSE 5 % IV SOLN
Freq: Once | INTRAVENOUS | Status: AC
  Administered 2018-07-06: 11:00:00 via INTRAVENOUS
  Filled 2018-07-06: qty 250

## 2018-07-06 MED ORDER — SODIUM CHLORIDE 0.9 % IV SOLN
5.0000 mg/kg | Freq: Once | INTRAVENOUS | Status: AC
  Administered 2018-07-06: 450 mg via INTRAVENOUS
  Filled 2018-07-06: qty 16

## 2018-07-06 MED ORDER — DEXAMETHASONE SODIUM PHOSPHATE 10 MG/ML IJ SOLN
10.0000 mg | Freq: Once | INTRAMUSCULAR | Status: AC
  Administered 2018-07-06: 10 mg via INTRAVENOUS
  Filled 2018-07-06: qty 1

## 2018-07-06 MED ORDER — PALONOSETRON HCL INJECTION 0.25 MG/5ML
0.2500 mg | Freq: Once | INTRAVENOUS | Status: AC
  Administered 2018-07-06: 0.25 mg via INTRAVENOUS
  Filled 2018-07-06: qty 5

## 2018-07-06 MED ORDER — FLUOROURACIL CHEMO INJECTION 2.5 GM/50ML
400.0000 mg/m2 | Freq: Once | INTRAVENOUS | Status: AC
  Administered 2018-07-06: 800 mg via INTRAVENOUS
  Filled 2018-07-06: qty 16

## 2018-07-06 MED ORDER — SODIUM CHLORIDE 0.9 % IV SOLN: 10.0000 mg | Freq: Once | INTRAVENOUS | Status: DC

## 2018-07-06 NOTE — Progress Notes (Signed)
No new changes noted today 

## 2018-07-07 LAB — CEA: CEA1: 3.1 ng/mL (ref 0.0–4.7)

## 2018-07-08 ENCOUNTER — Inpatient Hospital Stay: Payer: Medicare Other

## 2018-07-08 VITALS — BP 145/78 | HR 76 | Temp 98.1°F | Resp 18

## 2018-07-08 DIAGNOSIS — C189 Malignant neoplasm of colon, unspecified: Secondary | ICD-10-CM

## 2018-07-08 DIAGNOSIS — C7800 Secondary malignant neoplasm of unspecified lung: Secondary | ICD-10-CM

## 2018-07-08 DIAGNOSIS — Z5112 Encounter for antineoplastic immunotherapy: Secondary | ICD-10-CM | POA: Diagnosis not present

## 2018-07-08 MED ORDER — HEPARIN SOD (PORK) LOCK FLUSH 100 UNIT/ML IV SOLN
500.0000 [IU] | Freq: Once | INTRAVENOUS | Status: AC | PRN
  Administered 2018-07-08: 500 [IU]

## 2018-07-08 MED ORDER — HEPARIN SOD (PORK) LOCK FLUSH 100 UNIT/ML IV SOLN
INTRAVENOUS | Status: AC
  Filled 2018-07-08: qty 5

## 2018-07-08 MED ORDER — SODIUM CHLORIDE 0.9% FLUSH
10.0000 mL | INTRAVENOUS | Status: DC | PRN
  Administered 2018-07-08: 10 mL
  Filled 2018-07-08: qty 10

## 2018-07-20 ENCOUNTER — Encounter: Payer: Self-pay | Admitting: Oncology

## 2018-07-20 ENCOUNTER — Inpatient Hospital Stay: Payer: Medicare Other | Attending: Oncology

## 2018-07-20 ENCOUNTER — Inpatient Hospital Stay (HOSPITAL_BASED_OUTPATIENT_CLINIC_OR_DEPARTMENT_OTHER): Payer: Medicare Other | Admitting: Oncology

## 2018-07-20 ENCOUNTER — Inpatient Hospital Stay: Payer: Medicare Other

## 2018-07-20 VITALS — BP 150/82 | HR 98 | Temp 98.4°F | Resp 18 | Ht 66.0 in | Wt 191.8 lb

## 2018-07-20 DIAGNOSIS — I10 Essential (primary) hypertension: Secondary | ICD-10-CM | POA: Diagnosis not present

## 2018-07-20 DIAGNOSIS — Z5112 Encounter for antineoplastic immunotherapy: Secondary | ICD-10-CM | POA: Diagnosis present

## 2018-07-20 DIAGNOSIS — C7800 Secondary malignant neoplasm of unspecified lung: Secondary | ICD-10-CM | POA: Diagnosis not present

## 2018-07-20 DIAGNOSIS — D701 Agranulocytosis secondary to cancer chemotherapy: Secondary | ICD-10-CM | POA: Insufficient documentation

## 2018-07-20 DIAGNOSIS — C189 Malignant neoplasm of colon, unspecified: Secondary | ICD-10-CM

## 2018-07-20 DIAGNOSIS — Z5189 Encounter for other specified aftercare: Secondary | ICD-10-CM | POA: Diagnosis not present

## 2018-07-20 DIAGNOSIS — Z5111 Encounter for antineoplastic chemotherapy: Secondary | ICD-10-CM | POA: Diagnosis present

## 2018-07-20 DIAGNOSIS — T451X5A Adverse effect of antineoplastic and immunosuppressive drugs, initial encounter: Secondary | ICD-10-CM | POA: Diagnosis not present

## 2018-07-20 DIAGNOSIS — C187 Malignant neoplasm of sigmoid colon: Secondary | ICD-10-CM | POA: Insufficient documentation

## 2018-07-20 LAB — CBC WITH DIFFERENTIAL/PLATELET
BASOS ABS: 0 10*3/uL (ref 0–0.1)
BASOS PCT: 1 %
EOS PCT: 3 %
Eosinophils Absolute: 0.1 10*3/uL (ref 0–0.7)
HEMATOCRIT: 37.4 % — AB (ref 40.0–52.0)
Hemoglobin: 12.6 g/dL — ABNORMAL LOW (ref 13.0–18.0)
LYMPHS PCT: 58 %
Lymphs Abs: 1.9 10*3/uL (ref 1.0–3.6)
MCH: 29.5 pg (ref 26.0–34.0)
MCHC: 33.8 g/dL (ref 32.0–36.0)
MCV: 87.3 fL (ref 80.0–100.0)
MONO ABS: 0.4 10*3/uL (ref 0.2–1.0)
Monocytes Relative: 13 %
NEUTROS ABS: 0.8 10*3/uL — AB (ref 1.4–6.5)
Neutrophils Relative %: 25 %
Platelets: 163 10*3/uL (ref 150–440)
RBC: 4.29 MIL/uL — AB (ref 4.40–5.90)
RDW: 13.9 % (ref 11.5–14.5)
WBC: 3.3 10*3/uL — ABNORMAL LOW (ref 3.8–10.6)

## 2018-07-20 LAB — COMPREHENSIVE METABOLIC PANEL
ALBUMIN: 3.8 g/dL (ref 3.5–5.0)
ALK PHOS: 51 U/L (ref 38–126)
ALT: 19 U/L (ref 0–44)
AST: 25 U/L (ref 15–41)
Anion gap: 8 (ref 5–15)
BILIRUBIN TOTAL: 0.7 mg/dL (ref 0.3–1.2)
BUN: 14 mg/dL (ref 8–23)
CO2: 22 mmol/L (ref 22–32)
CREATININE: 1.1 mg/dL (ref 0.61–1.24)
Calcium: 9 mg/dL (ref 8.9–10.3)
Chloride: 109 mmol/L (ref 98–111)
GFR calc Af Amer: >60 mL/min (ref >60)
GFR calc non Af Amer: >60 mL/min (ref >60)
GLUCOSE: 237 mg/dL — AB (ref 70–99)
Potassium: 3.7 mmol/L (ref 3.5–5.1)
SODIUM: 139 mmol/L (ref 135–145)
Total Protein: 6.5 g/dL (ref 6.5–8.1)

## 2018-07-20 LAB — PROTEIN, URINE, RANDOM: Total Protein, Urine: 16 mg/dL

## 2018-07-20 MED ORDER — HEPARIN SOD (PORK) LOCK FLUSH 100 UNIT/ML IV SOLN
500.0000 [IU] | Freq: Once | INTRAVENOUS | Status: AC
  Administered 2018-07-20: 500 [IU] via INTRAVENOUS
  Filled 2018-07-20: qty 5

## 2018-07-20 NOTE — Progress Notes (Signed)
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 Patient is a 77 year old male who presented with evidence of bowel obstruction on 02/06/2017. At that time he had progressive abdominal distention and no bowel movement for about one week. CT abdomen showed an apple core lesion in the descending/sigmoid colon.  2. Patient underwent Hartmann's procedure for obstructing sigmoid colon mass on 02/06/2017. Preoperative CEA was 5.0  3. 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 It is 0 out of 15 lymph nodes were positive for malignancy. Perineural and lymphovascular invasion was present. Margins were negative.pT3N0. MMR stable.  4. Given that he had high risk stage II colon cancer including he presented with obstruction and has LVI and perineural invasion- adjuvant xeloda chemotherapy was recommended for 6 months.   5.Patient had evidence of hand foot syndrome after cycle 3. Cycle 4 delayed by 1 week. Topical urea cream prescribed  6. Patient completed 8 cycles of adjuvant xeloda in October 2018. polps seen in sigmoid and decending colon but negative for malignancy  7. Repeat Ct abdomen after 8 cycles showed no metastatic disease. 6 mm lung nodule noted in RLL  8.Patient had a repeat colonoscopy in October 2018 which showed polyps in his transverse and descending colon which were taken out and were negative for  high-grade dysplasia or malignancy. Patient subsequently underwent colostomy takedown procedure by Dr. Burt Knack  9. Repeat CT chest abdomen/ pelvis showed increase in the size of previously seen lung nodules largest being 1.4 cm consistent with metastatic disease. PET/CT showed mild uptake in 2 lung nodules. Others were not hypermetabolic. No other evidence of metastatic disease  10. Repeat lung biopsy consistent with colon adenocarcinoma. Comprehensive RAS panel testing showed mutant KRAS  11.Given the slow rate of growth of his colon cancer and desire to maintain his quality of life plan was to start chemotherapy after he gets a 38-monthbreak in summer. Repeat CT chest abdomen and pelvis in August 2019 showed mild increase in the size of his lung nodules. No new lung nodules are no new sites of distant metastatic disease.  FOLFOX Avastin chemotherapy started on 06/22/2018  Interval history-he has tolerated chemotherapy well so far.  He does report a raspy voice since his chemotherapy which has made it difficult for him to sing at his choir at times.  Denies any tingling numbness in his extremities  ECOG PS- 1 Pain scale- 0 Opioid associated constipation- no  Review of systems- Review of Systems  Constitutional: Negative for chills, fever, malaise/fatigue and weight loss.  HENT: Negative for congestion, ear discharge and nosebleeds.   Eyes: Negative for blurred vision.  Respiratory: Negative for cough, hemoptysis, sputum production, shortness of breath and wheezing.   Cardiovascular: Negative for chest pain, palpitations, orthopnea and claudication.  Gastrointestinal: Negative for abdominal pain, blood in stool, constipation, diarrhea, heartburn, melena, nausea and vomiting.  Genitourinary: Negative for dysuria, flank pain, frequency, hematuria and urgency.  Musculoskeletal: Negative for back pain, joint pain and myalgias.  Skin: Negative for rash.  Neurological: Negative for dizziness,  tingling, focal weakness, seizures, weakness and headaches.  Endo/Heme/Allergies: Does not bruise/bleed easily.  Psychiatric/Behavioral: Negative for depression and suicidal ideas. The patient does not have insomnia.       No Known Allergies   Past Medical History:  Diagnosis Date  . Cancer ([place])    skin cancer-nose w graft,also shoulder- basal cell per pt  . Cataract    r eye  . Chronic kidney disease    kidney stones 20 y ago per pt  . Colon cancer ([place])   . Diabetes mellitus without complication ([place])   . Eczema   . History of kidney stones   . Hyperlipidemia   . Hypertension   . Vertigo      Past Surgical History:  Procedure Laterality Date  . CATARACT EXTRACTION Left   . COLECTOMY WITH COLOSTOMY CREATION/HARTMANN PROCEDURE N/A 02/06/2017   Procedure: COLECTOMY WITH COLOSTOMY CREATION/HARTMANN PROCEDURE;  Surgeon: [name], MD;  Location: [facility] ORS;  Service: General;  Laterality: N/A;  . COLON SURGERY    . COLONOSCOPY WITH PROPOFOL N/A 09/04/2017   Procedure: COLONOSCOPY WITH PROPOFOL;  Surgeon: Jonathon Bellows, MD;  Location: [facility] ENDOSCOPY;  Service: Gastroenterology;  Laterality: N/A;  . COLOSTOMY CLOSURE N/A 10/20/2017   Procedure: COLOSTOMY CLOSURE;  Surgeon: [name], MD;  Location: [facility] ORS;  Service: General;  Laterality: N/A;  . CYSTOSCOPY    . FLEXIBLE SIGMOIDOSCOPY N/A 02/06/2017   Procedure: FLEXIBLE SIGMOIDOSCOPY;  Surgeon: Christene Lye, MD;  Location: [facility] ENDOSCOPY;  Service: Endoscopy;  Laterality: N/A;  . PORTACATH PLACEMENT N/A 06/17/2018   Procedure: INSERTION PORT-A-CATH, WITH FLUOROSCOPY;  Surgeon: [name], MD;  Location: [facility] ORS;  Service: General;  Laterality: N/A;    Social History   Socioeconomic History  . Marital status: Married    Spouse name: Not on file  . Number of children: Not on file  . Years of education: Not on file  . Highest education level: Not on file  Occupational History  . Not on file    Social Needs  . Financial resource strain: Not on file  . Food insecurity:    Worry: Not on file    Inability: Not on file  . Transportation needs:    Medical: Not on file    Non-medical: Not on file  Tobacco Use  . Smoking status: Never Smoker  . Smokeless tobacco: Never Used  Substance and Sexual Activity  . Alcohol use: No  . Drug use: No  . Sexual activity: Not Currently  Lifestyle  . Physical activity:    Days per week: Not on file    Minutes per session: Not on file  . Stress: Not on file  Relationships  . Social connections:    Talks on phone: Not on file    Gets together: Not on file    Attends religious service: Not on file    Active member of club or organization: Not on file    Attends meetings of clubs or organizations: Not on file    Relationship status: Not on file  . Intimate partner violence:    Fear of current or ex partner: Not on file    Emotionally abused: Not on file    Physically abused: Not on file    Forced sexual activity: Not on file  Other Topics Concern  . Not on file  Social History Narrative  . Not on file    Family History  Problem Relation Age of  Onset  . Diabetes Mother   . AAA (abdominal aortic aneurysm) Mother   . Coronary artery disease Mother   . Diabetes Father   . Coronary artery disease Father   . Dementia Father   . Breast cancer Sister      Current Outpatient Medications:  .  acetaminophen (TYLENOL) 500 MG tablet, Take 500 mg by mouth every 6 (six) hours as needed (for pain.)., Disp: , Rfl:  .  aspirin EC 81 MG tablet, Take 81 mg by mouth daily., Disp: , Rfl:  .  glimepiride (AMARYL) 2 MG tablet, Take 8 mg by mouth daily with supper. , Disp: , Rfl:  .  HYDROcodone-Acetaminophen (VICODIN) 5-300 MG TABS, Take 1 tablet by mouth every 4 (four) hours as needed. (Patient not taking: Reported on 07/06/2018), Disp: 15 each, Rfl: 0 .  lidocaine-prilocaine (EMLA) cream, Place a small amount over cream over port 1 hour before  each treatment, cover cream with saran wrap for clothing protection (Patient not taking: Reported on 07/06/2018), Disp: 30 g, Rfl: 1 .  loratadine (CLARITIN) 10 MG tablet, Take 10 mg by mouth daily., Disp: , Rfl:  .  losartan (COZAAR) 25 MG tablet, Take 25 mg by mouth daily., Disp: , Rfl:  .  metFORMIN (GLUCOPHAGE) 1000 MG tablet, Take 1,000-1,500 tablets by mouth See admin instructions. TAKE 1 TABLET (1000 MG) BY MOUTH IN THE MORNING & TAKE 1.5 TABLETS (1500 MG) BY MOUTH AT SUPPER, Disp: , Rfl:  .  ondansetron (ZOFRAN) 8 MG tablet, Take 1 tablet (8 mg total) by mouth 2 (two) times daily as needed for refractory nausea / vomiting. Start on day 3 after chemotherapy. (Patient not taking: Reported on 07/06/2018), Disp: 30 tablet, Rfl: 1 .  pravastatin (PRAVACHOL) 20 MG tablet, Take 20 mg by mouth daily with supper. , Disp: , Rfl:  .  prochlorperazine (COMPAZINE) 10 MG tablet, Take 1 tablet (10 mg total) by mouth every 6 (six) hours as needed (Nausea or vomiting). (Patient not taking: Reported on 07/06/2018), Disp: 30 tablet, Rfl: 1 .  triamcinolone (KENALOG) 0.025 % cream, Apply 1 application topically daily as needed (FOR EZCEMA). , Disp: , Rfl:   Physical exam:  Vitals:   07/20/18 0837  BP: (!) 150/82  Pulse: 98  Resp: 18  Temp: 98.4 F (36.9 C)  TempSrc: Tympanic  Weight: 191 lb 12.8 oz (87 kg)  Height: 5' 6" (1.676 m)   Physical Exam  Constitutional: He is oriented to person, place, and time. He appears well-developed and well-nourished.  HENT:  Head: Normocephalic and atraumatic.  Mouth/Throat: Oropharynx is clear and moist.  Eyes: Pupils are equal, round, and reactive to light. EOM are normal.  Neck: Normal range of motion.  Cardiovascular: Normal rate, regular rhythm and normal heart sounds.  Pulmonary/Chest: Effort normal and breath sounds normal.  Abdominal: Soft. Bowel sounds are normal.  Neurological: He is alert and oriented to person, place, and time.  Skin: Skin is warm and  dry.     CMP Latest Ref Rng & Units 07/20/2018  Glucose 70 - 99 mg/dL 237(H)  BUN 8 - 23 mg/dL 14  Creatinine 0.61 - 1.24 mg/dL 1.10  Sodium 135 - 145 mmol/L 139  Potassium 3.5 - 5.1 mmol/L 3.7  Chloride 98 - 111 mmol/L 109  CO2 22 - 32 mmol/L 22  Calcium 8.9 - 10.3 mg/dL 9.0  Total Protein 6.5 - 8.1 g/dL 6.5  Total Bilirubin 0.3 - 1.2 mg/dL 0.7  Alkaline Phos 38 - 126  U/L 51  AST 15 - 41 U/L 25  ALT 0 - 44 U/L 19   CBC Latest Ref Rng & Units 07/20/2018  WBC 3.8 - 10.6 K/uL 3.3(L)  Hemoglobin 13.0 - 18.0 g/dL 12.6(L)  Hematocrit 40.0 - 52.0 % 37.4(L)  Platelets 150 - 440 K/uL 163      Assessment and plan- Patient is a 77 y.o. 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  He is here for on treatment assessment prior to cycle 3 of FOLFOX Avastin chemotherapy.  Patient is white count is 3.3 today with an ANC of 0.8.  Has chemo induced neutropenia and therefore will not be getting chemotherapy today.    He will be getting a repeat CBC with differential checked next week and if his [place] is greater than 1 he will directly proceed with FOLFOX Avastin chemotherapy next week.  I will plan to give him udenyca with subsequent chemotherapy cycles based on his white count  I will see him back in 3 weeks time for cycle 4 of FOLFOX Avastin chemotherapy with CBC and CMP.  His baseline CEA has never been elevated.  Patient has been keeping the time of his blood pressure at home and has been mostly running in the 140s.  His urine protein is +1 and is otherwise okay for him to proceed with a Avastin chemotherapy next week.  I will plan to get repeat scans after 6 cycles.   Visit Diagnosis 1. 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[place])   2. Malignant neoplasm metastatic to lung, unspecified laterality ([place])   3. Chemotherapy induced neutropenia ([facility])      Dr. Randa Evens, MD, MPH [facility] at [facility] [phone] 07/20/2018 3:41 PM

## 2018-07-20 NOTE — Progress Notes (Signed)
Raspy voice since last treatment.

## 2018-07-21 ENCOUNTER — Telehealth: Payer: Self-pay | Admitting: *Deleted

## 2018-07-21 NOTE — Telephone Encounter (Signed)
He wanted to know since his labs was low and he was not able to get chemo that is he starts back taking iron pill will it help. He also got a letter from GI office that he is due for his colonoscopy. He could not remember when he had it last. I called him back and told him the last colonoscopy 09/04/2018. His wbc was low and the iron pill will not help wbc. He states that he remembered that the iron pills made eczema worse so he does not want to start it back

## 2018-07-27 ENCOUNTER — Inpatient Hospital Stay: Payer: Medicare Other | Admitting: Oncology

## 2018-07-27 ENCOUNTER — Inpatient Hospital Stay: Payer: Medicare Other

## 2018-07-27 ENCOUNTER — Other Ambulatory Visit: Payer: Medicare Other

## 2018-07-27 VITALS — BP 146/77 | HR 82 | Resp 18

## 2018-07-27 DIAGNOSIS — Z5111 Encounter for antineoplastic chemotherapy: Secondary | ICD-10-CM | POA: Diagnosis not present

## 2018-07-27 DIAGNOSIS — C7800 Secondary malignant neoplasm of unspecified lung: Secondary | ICD-10-CM

## 2018-07-27 DIAGNOSIS — D701 Agranulocytosis secondary to cancer chemotherapy: Secondary | ICD-10-CM

## 2018-07-27 DIAGNOSIS — T451X5A Adverse effect of antineoplastic and immunosuppressive drugs, initial encounter: Secondary | ICD-10-CM

## 2018-07-27 DIAGNOSIS — C189 Malignant neoplasm of colon, unspecified: Secondary | ICD-10-CM

## 2018-07-27 LAB — CBC WITH DIFFERENTIAL/PLATELET
BASOS PCT: 1 %
Basophils Absolute: 0.1 10*3/uL (ref 0–0.1)
EOS ABS: 0.1 10*3/uL (ref 0–0.7)
Eosinophils Relative: 2 %
HCT: 39.1 % — ABNORMAL LOW (ref 40.0–52.0)
HEMOGLOBIN: 13.1 g/dL (ref 13.0–18.0)
Lymphocytes Relative: 40 %
Lymphs Abs: 2.4 10*3/uL (ref 1.0–3.6)
MCH: 29.3 pg (ref 26.0–34.0)
MCHC: 33.5 g/dL (ref 32.0–36.0)
MCV: 87.5 fL (ref 80.0–100.0)
Monocytes Absolute: 0.8 10*3/uL (ref 0.2–1.0)
Monocytes Relative: 13 %
NEUTROS PCT: 44 %
Neutro Abs: 2.7 10*3/uL (ref 1.4–6.5)
Platelets: 278 10*3/uL (ref 150–440)
RBC: 4.47 MIL/uL (ref 4.40–5.90)
RDW: 14.4 % (ref 11.5–14.5)
WBC: 6 10*3/uL (ref 3.8–10.6)

## 2018-07-27 LAB — COMPREHENSIVE METABOLIC PANEL
ALBUMIN: 4 g/dL (ref 3.5–5.0)
ALK PHOS: 56 U/L (ref 38–126)
ALT: 17 U/L (ref 0–44)
ANION GAP: 13 (ref 5–15)
AST: 28 U/L (ref 15–41)
BUN: 19 mg/dL (ref 8–23)
CALCIUM: 9.4 mg/dL (ref 8.9–10.3)
CO2: 21 mmol/L — AB (ref 22–32)
CREATININE: 1.13 mg/dL (ref 0.61–1.24)
Chloride: 105 mmol/L (ref 98–111)
GFR calc Af Amer: >60 mL/min (ref >60)
GFR calc non Af Amer: >60 mL/min (ref >60)
GLUCOSE: 229 mg/dL — AB (ref 70–99)
Potassium: 4.1 mmol/L (ref 3.5–5.1)
SODIUM: 139 mmol/L (ref 135–145)
Total Bilirubin: 0.5 mg/dL (ref 0.3–1.2)
Total Protein: 6.9 g/dL (ref 6.5–8.1)

## 2018-07-27 MED ORDER — SODIUM CHLORIDE 0.9 % IV SOLN
2400.0000 mg/m2 | INTRAVENOUS | Status: AC
  Administered 2018-07-27: 4850 mg via INTRAVENOUS
  Filled 2018-07-27: qty 97

## 2018-07-27 MED ORDER — OXALIPLATIN CHEMO INJECTION 100 MG/20ML
85.0000 mg/m2 | Freq: Once | INTRAVENOUS | Status: AC
  Administered 2018-07-27: 170 mg via INTRAVENOUS
  Filled 2018-07-27: qty 34

## 2018-07-27 MED ORDER — SODIUM CHLORIDE 0.9 % IV SOLN
INTRAVENOUS | Status: DC
  Administered 2018-07-27: 10:00:00 via INTRAVENOUS
  Filled 2018-07-27: qty 250

## 2018-07-27 MED ORDER — HEPARIN SOD (PORK) LOCK FLUSH 100 UNIT/ML IV SOLN
500.0000 [IU] | Freq: Once | INTRAVENOUS | Status: AC
  Filled 2018-07-27: qty 5

## 2018-07-27 MED ORDER — FLUOROURACIL CHEMO INJECTION 2.5 GM/50ML
400.0000 mg/m2 | Freq: Once | INTRAVENOUS | Status: AC
  Administered 2018-07-27: 800 mg via INTRAVENOUS
  Filled 2018-07-27: qty 16

## 2018-07-27 MED ORDER — PALONOSETRON HCL INJECTION 0.25 MG/5ML
0.2500 mg | Freq: Once | INTRAVENOUS | Status: AC
  Administered 2018-07-27: 0.25 mg via INTRAVENOUS
  Filled 2018-07-27: qty 5

## 2018-07-27 MED ORDER — LEUCOVORIN CALCIUM INJECTION 350 MG
396.0000 mg/m2 | Freq: Once | INTRAVENOUS | Status: AC
  Administered 2018-07-27: 800 mg via INTRAVENOUS
  Filled 2018-07-27: qty 25

## 2018-07-27 MED ORDER — DEXAMETHASONE SODIUM PHOSPHATE 10 MG/ML IJ SOLN
10.0000 mg | Freq: Once | INTRAMUSCULAR | Status: AC
  Administered 2018-07-27: 10 mg via INTRAVENOUS
  Filled 2018-07-27: qty 1

## 2018-07-27 MED ORDER — DEXTROSE 5 % IV SOLN
Freq: Once | INTRAVENOUS | Status: AC
  Administered 2018-07-27: 11:00:00 via INTRAVENOUS
  Filled 2018-07-27: qty 250

## 2018-07-27 MED ORDER — SODIUM CHLORIDE 0.9 % IV SOLN
5.0000 mg/kg | Freq: Once | INTRAVENOUS | Status: AC
  Administered 2018-07-27: 450 mg via INTRAVENOUS
  Filled 2018-07-27: qty 16

## 2018-07-27 MED ORDER — SODIUM CHLORIDE 0.9% FLUSH
10.0000 mL | INTRAVENOUS | Status: AC | PRN
  Administered 2018-07-27: 10 mL via INTRAVENOUS
  Filled 2018-07-27: qty 10

## 2018-07-29 ENCOUNTER — Inpatient Hospital Stay: Payer: Medicare Other

## 2018-07-29 ENCOUNTER — Telehealth: Payer: Self-pay | Admitting: *Deleted

## 2018-07-29 ENCOUNTER — Other Ambulatory Visit: Payer: Self-pay | Admitting: *Deleted

## 2018-07-29 DIAGNOSIS — C7800 Secondary malignant neoplasm of unspecified lung: Secondary | ICD-10-CM

## 2018-07-29 DIAGNOSIS — Z5111 Encounter for antineoplastic chemotherapy: Secondary | ICD-10-CM | POA: Diagnosis not present

## 2018-07-29 DIAGNOSIS — C189 Malignant neoplasm of colon, unspecified: Secondary | ICD-10-CM

## 2018-07-29 MED ORDER — SODIUM CHLORIDE 0.9% FLUSH
10.0000 mL | INTRAVENOUS | Status: DC | PRN
  Administered 2018-07-29: 10 mL
  Filled 2018-07-29: qty 10

## 2018-07-29 MED ORDER — PEGFILGRASTIM-CBQV 6 MG/0.6ML ~~LOC~~ SOSY
6.0000 mg | PREFILLED_SYRINGE | Freq: Once | SUBCUTANEOUS | Status: AC
  Administered 2018-07-29: 6 mg via SUBCUTANEOUS
  Filled 2018-07-29: qty 0.6

## 2018-07-29 MED ORDER — HEPARIN SOD (PORK) LOCK FLUSH 100 UNIT/ML IV SOLN
500.0000 [IU] | Freq: Once | INTRAVENOUS | Status: AC | PRN
  Administered 2018-07-29: 500 [IU]
  Filled 2018-07-29: qty 5

## 2018-07-29 NOTE — Telephone Encounter (Signed)
Pt going to the beach last week of October into Nov.  His scan was scheduled for 11/4 and he wants it moved to 11/8. When I called to get it r/s the appt was cancelled by patient and now the orders are no longer available so new orders entered and scheduled pt for outpt radiology on 11/8 10:30. Called patient and left him a message that he will get scan [place] location 10/8 arrive 10:15. Pick up prep kit at least a day ahead of time.NPO after midnight, do not take metformin until after scan. If he has questions please call

## 2018-08-02 ENCOUNTER — Telehealth: Payer: Self-pay | Admitting: *Deleted

## 2018-08-02 NOTE — Telephone Encounter (Signed)
Pt asked about if he should have colonoscopy in October.  The answer is no because he is currently getting chemo and Dr.Rao will let pt know when it is good time to get it. He is agreeable to plan

## 2018-08-10 ENCOUNTER — Inpatient Hospital Stay: Payer: Medicare Other | Attending: Oncology

## 2018-08-10 ENCOUNTER — Inpatient Hospital Stay: Payer: Medicare Other

## 2018-08-10 ENCOUNTER — Encounter: Payer: Self-pay | Admitting: Oncology

## 2018-08-10 ENCOUNTER — Inpatient Hospital Stay (HOSPITAL_BASED_OUTPATIENT_CLINIC_OR_DEPARTMENT_OTHER): Payer: Medicare Other | Admitting: Oncology

## 2018-08-10 VITALS — BP 149/79 | HR 76 | Temp 97.2°F | Ht 66.0 in | Wt 188.6 lb

## 2018-08-10 DIAGNOSIS — C189 Malignant neoplasm of colon, unspecified: Secondary | ICD-10-CM

## 2018-08-10 DIAGNOSIS — Z5189 Encounter for other specified aftercare: Secondary | ICD-10-CM | POA: Insufficient documentation

## 2018-08-10 DIAGNOSIS — C7800 Secondary malignant neoplasm of unspecified lung: Secondary | ICD-10-CM

## 2018-08-10 DIAGNOSIS — C187 Malignant neoplasm of sigmoid colon: Secondary | ICD-10-CM | POA: Diagnosis not present

## 2018-08-10 DIAGNOSIS — E119 Type 2 diabetes mellitus without complications: Secondary | ICD-10-CM

## 2018-08-10 DIAGNOSIS — Z5112 Encounter for antineoplastic immunotherapy: Secondary | ICD-10-CM | POA: Diagnosis present

## 2018-08-10 DIAGNOSIS — Z5111 Encounter for antineoplastic chemotherapy: Secondary | ICD-10-CM | POA: Diagnosis not present

## 2018-08-10 DIAGNOSIS — I10 Essential (primary) hypertension: Secondary | ICD-10-CM | POA: Diagnosis not present

## 2018-08-10 LAB — CBC WITH DIFFERENTIAL/PLATELET
BASOS ABS: 0.1 10*3/uL (ref 0–0.1)
BASOS PCT: 1 %
EOS ABS: 0.3 10*3/uL (ref 0–0.7)
Eosinophils Relative: 2 %
HEMATOCRIT: 37.8 % — AB (ref 40.0–52.0)
Hemoglobin: 12.7 g/dL — ABNORMAL LOW (ref 13.0–18.0)
Lymphocytes Relative: 29 %
Lymphs Abs: 3.3 10*3/uL (ref 1.0–3.6)
MCH: 29.5 pg (ref 26.0–34.0)
MCHC: 33.7 g/dL (ref 32.0–36.0)
MCV: 87.6 fL (ref 80.0–100.0)
MONOS PCT: 6 %
Monocytes Absolute: 0.7 10*3/uL (ref 0.2–1.0)
NEUTROS ABS: 6.9 10*3/uL — AB (ref 1.4–6.5)
NEUTROS PCT: 62 %
Platelets: 156 10*3/uL (ref 150–440)
RBC: 4.31 MIL/uL — ABNORMAL LOW (ref 4.40–5.90)
RDW: 15 % — AB (ref 11.5–14.5)
WBC: 11.3 10*3/uL — AB (ref 3.8–10.6)

## 2018-08-10 LAB — COMPREHENSIVE METABOLIC PANEL
ALK PHOS: 86 U/L (ref 38–126)
ALT: 16 U/L (ref 0–44)
ANION GAP: 11 (ref 5–15)
AST: 21 U/L (ref 15–41)
Albumin: 3.9 g/dL (ref 3.5–5.0)
BILIRUBIN TOTAL: 0.6 mg/dL (ref 0.3–1.2)
BUN: 18 mg/dL (ref 8–23)
CALCIUM: 8.6 mg/dL — AB (ref 8.9–10.3)
CO2: 21 mmol/L — AB (ref 22–32)
Chloride: 105 mmol/L (ref 98–111)
Creatinine, Ser: 1.18 mg/dL (ref 0.61–1.24)
GFR, EST NON AFRICAN AMERICAN: 57 mL/min — AB (ref >60)
Glucose, Bld: 241 mg/dL — ABNORMAL HIGH (ref 70–99)
Potassium: 4 mmol/L (ref 3.5–5.1)
SODIUM: 137 mmol/L (ref 135–145)
TOTAL PROTEIN: 6.8 g/dL (ref 6.5–8.1)

## 2018-08-10 LAB — PROTEIN, URINE, RANDOM: Total Protein, Urine: 18 mg/dL

## 2018-08-10 MED ORDER — DEXTROSE 5 % IV SOLN
Freq: Once | INTRAVENOUS | Status: AC
  Administered 2018-08-10: 10:00:00 via INTRAVENOUS
  Filled 2018-08-10: qty 250

## 2018-08-10 MED ORDER — SODIUM CHLORIDE 0.9 % IV SOLN
INTRAVENOUS | Status: DC
  Administered 2018-08-10: 09:00:00 via INTRAVENOUS
  Filled 2018-08-10: qty 250

## 2018-08-10 MED ORDER — PALONOSETRON HCL INJECTION 0.25 MG/5ML
0.2500 mg | Freq: Once | INTRAVENOUS | Status: AC
  Administered 2018-08-10: 0.25 mg via INTRAVENOUS
  Filled 2018-08-10: qty 5

## 2018-08-10 MED ORDER — SODIUM CHLORIDE 0.9% FLUSH
10.0000 mL | Freq: Once | INTRAVENOUS | Status: AC
  Administered 2018-08-10: 10 mL via INTRAVENOUS
  Filled 2018-08-10: qty 10

## 2018-08-10 MED ORDER — SODIUM CHLORIDE 0.9 % IV SOLN
5.0000 mg/kg | Freq: Once | INTRAVENOUS | Status: AC
  Administered 2018-08-10: 450 mg via INTRAVENOUS
  Filled 2018-08-10: qty 16

## 2018-08-10 MED ORDER — DEXAMETHASONE SODIUM PHOSPHATE 10 MG/ML IJ SOLN
10.0000 mg | Freq: Once | INTRAMUSCULAR | Status: AC
  Administered 2018-08-10: 10 mg via INTRAVENOUS
  Filled 2018-08-10: qty 1

## 2018-08-10 MED ORDER — FLUOROURACIL CHEMO INJECTION 2.5 GM/50ML
400.0000 mg/m2 | Freq: Once | INTRAVENOUS | Status: AC
  Administered 2018-08-10: 800 mg via INTRAVENOUS
  Filled 2018-08-10: qty 16

## 2018-08-10 MED ORDER — SODIUM CHLORIDE 0.9 % IV SOLN
2400.0000 mg/m2 | INTRAVENOUS | Status: DC
  Administered 2018-08-10: 4850 mg via INTRAVENOUS
  Filled 2018-08-10: qty 97

## 2018-08-10 MED ORDER — OXALIPLATIN CHEMO INJECTION 100 MG/20ML
85.0000 mg/m2 | Freq: Once | INTRAVENOUS | Status: AC
  Administered 2018-08-10: 170 mg via INTRAVENOUS
  Filled 2018-08-10: qty 34

## 2018-08-10 MED ORDER — LEUCOVORIN CALCIUM INJECTION 350 MG
396.0000 mg/m2 | Freq: Once | INTRAVENOUS | Status: AC
  Administered 2018-08-10: 800 mg via INTRAVENOUS
  Filled 2018-08-10: qty 25

## 2018-08-10 MED ORDER — HEPARIN SOD (PORK) LOCK FLUSH 100 UNIT/ML IV SOLN: 500.0000 [IU] | Freq: Once | INTRAVENOUS | Status: DC

## 2018-08-10 NOTE — Progress Notes (Signed)
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 Patient is a 77 year old male who presented with evidence of bowel obstruction on 02/06/2017. At that time he had progressive abdominal distention and no bowel movement for about one week. CT abdomen showed an apple core lesion in the descending/sigmoid colon.  2. Patient underwent Hartmann's procedure for obstructing sigmoid colon mass on 02/06/2017. Preoperative CEA was 5.0  3. 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 It is 0 out of 15 lymph nodes were positive for malignancy. Perineural and lymphovascular invasion was present. Margins were negative.pT3N0. MMR stable.  4. Given that he had high risk stage II colon cancer including he presented with obstruction and has LVI and perineural invasion- adjuvant xeloda chemotherapy was recommended for 6 months.   5.Patient had evidence of hand foot syndrome after cycle 3. Cycle 4 delayed by 1 week. Topical urea cream prescribed  6. Patient completed 8 cycles of adjuvant xeloda in October 2018. polps seen in sigmoid and decending colon but negative for malignancy  7. Repeat Ct abdomen after 8 cycles showed no metastatic disease. 6 mm lung nodule noted in RLL  8.Patient had a repeat colonoscopy in October 2018 which showed polyps in his transverse and descending colon which were taken out and were negative for  high-grade dysplasia or malignancy. Patient subsequently underwent colostomy takedown procedure by Dr. Burt Knack  9. Repeat CT chest abdomen/ pelvis showed increase in the size of previously seen lung nodules largest being 1.4 cm consistent with metastatic disease. PET/CT showed mild uptake in 2 lung nodules. Others were not hypermetabolic. No other evidence of metastatic disease  10. Repeat lung biopsy consistent with colon adenocarcinoma. Comprehensive RAS panel testing showed mutant KRAS  11.Given the slow rate of growth of his colon cancer and desire to maintain his quality of life plan was to start chemotherapy after he gets a 39-monthbreak in summer. Repeat CT chest abdomen and pelvis in August 2019 showed mild increase in the size of his lung nodules. No new lung nodules are no new sites of distant metastatic disease.FOLFOX Avastin chemotherapy started on 06/22/2018   Interval history- he feels well. Denies any nausea vomiting or fatigue.  Denies any problems with his bowel movements.  Denies any new tingling numbness in his extremities.  He did note some voice change after his first dose of chemotherapy which is overall stable.  ECOG PS- 1 Pain scale- 0 Opioid associated constipation- no  Review of systems- Review of Systems  Constitutional: Negative for chills, fever, malaise/fatigue and weight loss.  HENT: Negative for congestion, ear discharge and nosebleeds.   Eyes: Negative for blurred vision.  Respiratory: Negative for cough, hemoptysis, sputum production, shortness of breath and wheezing.   Cardiovascular: Negative for chest pain, palpitations, orthopnea and claudication.  Gastrointestinal: Negative for abdominal pain, blood in stool, constipation, diarrhea, heartburn, melena, nausea and vomiting.  Genitourinary: Negative for dysuria, flank pain, frequency, hematuria and urgency.  Musculoskeletal: Negative for back pain, joint pain and myalgias.  Skin: Negative for rash.   Neurological: Negative for dizziness, tingling, focal weakness, seizures, weakness and headaches.  Endo/Heme/Allergies: Does not bruise/bleed easily.  Psychiatric/Behavioral: Negative for depression and suicidal ideas. The patient does not have insomnia.       No Known Allergies   Past Medical History:  Diagnosis Date  . Cancer ([place])    skin cancer-nose w graft,also shoulder- basal cell per pt  . Cataract    r eye  . Chronic kidney disease    kidney stones 20 y ago per pt  . Colon cancer ([place])   . Diabetes mellitus without complication ([place])   . Eczema   . History of kidney stones   . Hyperlipidemia   . Hypertension   . Vertigo      Past Surgical History:  Procedure Laterality Date  . CATARACT EXTRACTION Left   . COLECTOMY WITH COLOSTOMY CREATION/HARTMANN PROCEDURE N/A 02/06/2017   Procedure: COLECTOMY WITH COLOSTOMY CREATION/HARTMANN PROCEDURE;  Surgeon: [name], MD;  Location: [facility] ORS;  Service: General;  Laterality: N/A;  . COLON SURGERY    . COLONOSCOPY WITH PROPOFOL N/A 09/04/2017   Procedure: COLONOSCOPY WITH PROPOFOL;  Surgeon: Jonathon Bellows, MD;  Location: [facility] ENDOSCOPY;  Service: Gastroenterology;  Laterality: N/A;  . COLOSTOMY CLOSURE N/A 10/20/2017   Procedure: COLOSTOMY CLOSURE;  Surgeon: [name], MD;  Location: [facility] ORS;  Service: General;  Laterality: N/A;  . CYSTOSCOPY    . FLEXIBLE SIGMOIDOSCOPY N/A 02/06/2017   Procedure: FLEXIBLE SIGMOIDOSCOPY;  Surgeon: Christene Lye, MD;  Location: [facility] ENDOSCOPY;  Service: Endoscopy;  Laterality: N/A;  . PORTACATH PLACEMENT N/A 06/17/2018   Procedure: INSERTION PORT-A-CATH, WITH FLUOROSCOPY;  Surgeon: [name], MD;  Location: [facility] ORS;  Service: General;  Laterality: N/A;    Social History   Socioeconomic History  . Marital status: Married    Spouse name: Not on file  . Number of children: Not on file  . Years of education: Not on file  . Highest education level: Not on file    Occupational History  . Not on file  Social Needs  . Financial resource strain: Not on file  . Food insecurity:    Worry: Not on file    Inability: Not on file  . Transportation needs:    Medical: Not on file    Non-medical: Not on file  Tobacco Use  . Smoking status: Never Smoker  . Smokeless tobacco: Never Used  Substance and Sexual Activity  . Alcohol use: No  . Drug use: No  . Sexual activity: Not Currently  Lifestyle  . Physical activity:    Days per week: Not on file    Minutes per session: Not on file  . Stress: Not on file  Relationships  . Social connections:    Talks on phone: Not on file    Gets together: Not on file    Attends religious service: Not on file    Active member of club or organization: Not on file    Attends meetings of clubs or organizations: Not on file    Relationship status: Not on file  . Intimate partner violence:    Fear of current or ex partner: Not on file    Emotionally abused: Not on file    Physically abused: Not on file    Forced sexual activity: Not on file  Other Topics Concern  . Not on file  Social History Narrative  . Not on file    Family History  Problem Relation Age of Onset  . Diabetes Mother   . AAA (abdominal aortic aneurysm) Mother   . Coronary artery disease Mother   . Diabetes Father   . Coronary artery disease Father   . Dementia Father   . Breast cancer Sister      Current Outpatient Medications:  .  acetaminophen (TYLENOL) 500 MG tablet, Take 500 mg by mouth every 6 (six) hours as needed (for pain.)., Disp: , Rfl:  .  aspirin EC 81 MG tablet, Take 81 mg by mouth daily., Disp: , Rfl:  .  glimepiride (AMARYL) 2 MG tablet, Take 8 mg by mouth daily with supper. , Disp: , Rfl:  .  glimepiride (AMARYL) 4 MG tablet, Take 4 mg by mouth 2 (two) times daily., Disp: , Rfl:  .  HYDROcodone-Acetaminophen (VICODIN) 5-300 MG TABS, Take 1 tablet by mouth every 4 (four) hours as needed. (Patient not taking: Reported on  07/06/2018), Disp: 15 each, Rfl: 0 .  lidocaine-prilocaine (EMLA) cream, Place a small amount over cream over port 1 hour before each treatment, cover cream with saran wrap for clothing protection, Disp: 30 g, Rfl: 1 .  loratadine (CLARITIN) 10 MG tablet, Take 10 mg by mouth daily., Disp: , Rfl:  .  losartan (COZAAR) 25 MG tablet, Take 25 mg by mouth daily., Disp: , Rfl:  .  metFORMIN (GLUCOPHAGE) 1000 MG tablet, Take 1,000-1,500 tablets by mouth See admin instructions. TAKE 1 TABLET (1000 MG) BY MOUTH IN THE MORNING & TAKE 1.5 TABLETS (1500 MG) BY MOUTH AT SUPPER, Disp: , Rfl:  .  ondansetron (ZOFRAN) 8 MG tablet, Take 1 tablet (8 mg total) by mouth 2 (two) times daily as needed for refractory nausea / vomiting. Start on day 3 after chemotherapy., Disp: 30 tablet, Rfl: 1 .  pravastatin (PRAVACHOL) 20 MG tablet, Take 20 mg by mouth daily with supper. , Disp: , Rfl:  .  prochlorperazine (COMPAZINE) 10 MG tablet, Take 1 tablet (10 mg total) by mouth every 6 (six) hours as needed (Nausea or vomiting)., Disp: 30 tablet, Rfl: 1 .  triamcinolone (KENALOG) 0.025 % cream, Apply 1 application topically daily as needed (FOR EZCEMA). , Disp: , Rfl:  No current facility-administered medications for this visit.   Facility-Administered Medications Ordered in Other Visits:  .  0.9 %  sodium chloride infusion, , Intravenous, Continuous, Sindy Guadeloupe, MD, Stopped at 07/27/18 1037 .  heparin lock flush 100 unit/mL, 500 Units, Intravenous, Once, Sindy Guadeloupe, MD .  heparin lock flush 100 unit/mL, 500 Units, Intravenous, Once, Sindy Guadeloupe, MD .  sodium chloride flush (NS) 0.9 % injection 10 mL, 10 mL, Intravenous, PRN, Sindy Guadeloupe, MD, 10 mL at 07/27/18 0920  Physical exam:  Vitals:   08/10/18 0844  BP: (!) 149/79  Pulse: 76  Temp: (!) 97.2 F (36.2 C)  TempSrc: Tympanic  SpO2: 96%  Weight: 188 lb 9.6 oz (85.5 kg)  Height: 5' 6" (1.676 m)   Physical Exam  Constitutional: He is oriented to person,  place, and time.  HENT:  Head: Normocephalic and atraumatic.  Eyes: Pupils are equal, round, and reactive to light. EOM are normal.  Neck: Normal range of motion.  Cardiovascular: Normal rate, regular rhythm and normal heart sounds.  Pulmonary/Chest: Effort normal and breath sounds normal.  Abdominal: Soft. Bowel sounds are normal.  Neurological: He is alert and oriented to person, place, and time.  Skin: Skin is warm and dry.     CMP  Latest Ref Rng & Units 07/27/2018  Glucose 70 - 99 mg/dL 229(H)  BUN 8 - 23 mg/dL 19  Creatinine 0.61 - 1.24 mg/dL 1.13  Sodium 135 - 145 mmol/L 139  Potassium 3.5 - 5.1 mmol/L 4.1  Chloride 98 - 111 mmol/L 105  CO2 22 - 32 mmol/L 21(L)  Calcium 8.9 - 10.3 mg/dL 9.4  Total Protein 6.5 - 8.1 g/dL 6.9  Total Bilirubin 0.3 - 1.2 mg/dL 0.5  Alkaline Phos 38 - 126 U/L 56  AST 15 - 41 U/L 28  ALT 0 - 44 U/L 17   CBC Latest Ref Rng & Units 08/10/2018  WBC 3.8 - 10.6 K/uL 11.3(H)  Hemoglobin 13.0 - 18.0 g/dL 12.7(L)  Hematocrit 40.0 - 52.0 % 37.8(L)  Platelets 150 - 440 K/uL 156     Assessment and plan- Patient is a 77 y.o. 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  He is here for on treatment assessment prior to cycle 4 of FOLFOX Avastin chemotherapy  Patient's white count is 11.3 today with an ANC of 6.9.  Hemoglobin and platelet counts are stable.  He can proceed with cycle 4 of FOLFOX Avastin chemotherapy today.  He will not be getting Neulasta on day 3.  He has had trace urine protein  which we will continue to monitor.  He is currently on losartan and his blood pressure has been in the 140s at home.  If it goes any higher we will touch base with Dr. Netty Starring to adjust his blood pressure medications now that he is on Avastin  I will see him back in 2 weeks time with CBC and CMP for cycle 5 of FOLFOX Avastin chemotherapy.  Scans after cycle 6   Visit Diagnosis 1. Malignant neoplasm metastatic to lung, unspecified laterality  ([place])   2. 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[place])   3. Encounter for antineoplastic chemotherapy      Dr. Randa Evens, MD, MPH [facility] at [facility] [phone] 08/10/2018 12:08 PM

## 2018-08-12 ENCOUNTER — Inpatient Hospital Stay: Payer: Medicare Other

## 2018-08-12 DIAGNOSIS — Z5111 Encounter for antineoplastic chemotherapy: Secondary | ICD-10-CM | POA: Diagnosis not present

## 2018-08-12 DIAGNOSIS — C7800 Secondary malignant neoplasm of unspecified lung: Secondary | ICD-10-CM

## 2018-08-12 DIAGNOSIS — C189 Malignant neoplasm of colon, unspecified: Secondary | ICD-10-CM

## 2018-08-12 MED ORDER — PEGFILGRASTIM-CBQV 6 MG/0.6ML ~~LOC~~ SOSY
6.0000 mg | PREFILLED_SYRINGE | Freq: Once | SUBCUTANEOUS | Status: DC
  Filled 2018-08-12: qty 0.6

## 2018-08-12 MED ORDER — HEPARIN SOD (PORK) LOCK FLUSH 100 UNIT/ML IV SOLN
500.0000 [IU] | Freq: Once | INTRAVENOUS | Status: AC
  Administered 2018-08-12: 500 [IU] via INTRAVENOUS
  Filled 2018-08-12: qty 5

## 2018-08-12 MED ORDER — SODIUM CHLORIDE 0.9% FLUSH
10.0000 mL | INTRAVENOUS | Status: DC | PRN
  Administered 2018-08-12: 10 mL via INTRAVENOUS
  Filled 2018-08-12: qty 10

## 2018-08-24 ENCOUNTER — Inpatient Hospital Stay (HOSPITAL_BASED_OUTPATIENT_CLINIC_OR_DEPARTMENT_OTHER): Payer: Medicare Other | Admitting: Oncology

## 2018-08-24 ENCOUNTER — Inpatient Hospital Stay: Payer: Medicare Other

## 2018-08-24 ENCOUNTER — Encounter: Payer: Self-pay | Admitting: Oncology

## 2018-08-24 VITALS — BP 151/79 | HR 62

## 2018-08-24 VITALS — BP 177/74 | HR 71 | Temp 97.6°F | Ht 66.0 in | Wt 188.2 lb

## 2018-08-24 DIAGNOSIS — I10 Essential (primary) hypertension: Secondary | ICD-10-CM | POA: Diagnosis not present

## 2018-08-24 DIAGNOSIS — C7800 Secondary malignant neoplasm of unspecified lung: Secondary | ICD-10-CM | POA: Diagnosis not present

## 2018-08-24 DIAGNOSIS — Z5111 Encounter for antineoplastic chemotherapy: Secondary | ICD-10-CM | POA: Diagnosis not present

## 2018-08-24 DIAGNOSIS — C187 Malignant neoplasm of sigmoid colon: Secondary | ICD-10-CM

## 2018-08-24 DIAGNOSIS — E119 Type 2 diabetes mellitus without complications: Secondary | ICD-10-CM | POA: Diagnosis not present

## 2018-08-24 DIAGNOSIS — C189 Malignant neoplasm of colon, unspecified: Secondary | ICD-10-CM

## 2018-08-24 LAB — COMPREHENSIVE METABOLIC PANEL
ALBUMIN: 4 g/dL (ref 3.5–5.0)
ALT: 16 U/L (ref 0–44)
ANION GAP: 10 (ref 5–15)
AST: 21 U/L (ref 15–41)
Alkaline Phosphatase: 68 U/L (ref 38–126)
BUN: 14 mg/dL (ref 8–23)
CHLORIDE: 108 mmol/L (ref 98–111)
CO2: 22 mmol/L (ref 22–32)
Calcium: 9.4 mg/dL (ref 8.9–10.3)
Creatinine, Ser: 0.98 mg/dL (ref 0.61–1.24)
GFR calc Af Amer: >60 mL/min (ref >60)
GFR calc non Af Amer: >60 mL/min (ref >60)
Glucose, Bld: 199 mg/dL — ABNORMAL HIGH (ref 70–99)
POTASSIUM: 4.1 mmol/L (ref 3.5–5.1)
Sodium: 140 mmol/L (ref 135–145)
TOTAL PROTEIN: 7 g/dL (ref 6.5–8.1)
Total Bilirubin: 0.5 mg/dL (ref 0.3–1.2)

## 2018-08-24 LAB — CBC WITH DIFFERENTIAL/PLATELET
ABS IMMATURE GRANULOCYTES: 0.01 10*3/uL (ref 0.00–0.07)
BASOS ABS: 0 10*3/uL (ref 0.0–0.1)
Basophils Relative: 1 %
Eosinophils Absolute: 0.1 10*3/uL (ref 0.0–0.5)
Eosinophils Relative: 2 %
HCT: 37.4 % — ABNORMAL LOW (ref 39.0–52.0)
HEMOGLOBIN: 12.2 g/dL — AB (ref 13.0–17.0)
IMMATURE GRANULOCYTES: 0 %
LYMPHS PCT: 42 %
Lymphs Abs: 1.7 10*3/uL (ref 0.7–4.0)
MCH: 28.4 pg (ref 26.0–34.0)
MCHC: 32.6 g/dL (ref 30.0–36.0)
MCV: 87.2 fL (ref 80.0–100.0)
Monocytes Absolute: 0.6 10*3/uL (ref 0.1–1.0)
Monocytes Relative: 14 %
NEUTROS ABS: 1.7 10*3/uL (ref 1.7–7.7)
NEUTROS PCT: 41 %
NRBC: 0 % (ref 0.0–0.2)
PLATELETS: 170 10*3/uL (ref 150–400)
RBC: 4.29 MIL/uL (ref 4.22–5.81)
RDW: 15 % (ref 11.5–15.5)
WBC: 4.1 10*3/uL (ref 4.0–10.5)

## 2018-08-24 LAB — PROTEIN, URINE, RANDOM: TOTAL PROTEIN, URINE: 11 mg/dL

## 2018-08-24 MED ORDER — FLUOROURACIL CHEMO INJECTION 2.5 GM/50ML
400.0000 mg/m2 | Freq: Once | INTRAVENOUS | Status: AC
  Administered 2018-08-24: 800 mg via INTRAVENOUS
  Filled 2018-08-24: qty 16

## 2018-08-24 MED ORDER — PALONOSETRON HCL INJECTION 0.25 MG/5ML
0.2500 mg | Freq: Once | INTRAVENOUS | Status: AC
  Administered 2018-08-24: 0.25 mg via INTRAVENOUS
  Filled 2018-08-24: qty 5

## 2018-08-24 MED ORDER — SODIUM CHLORIDE 0.9 % IV SOLN
5.0000 mg/kg | Freq: Once | INTRAVENOUS | Status: AC
  Administered 2018-08-24: 450 mg via INTRAVENOUS
  Filled 2018-08-24: qty 16

## 2018-08-24 MED ORDER — LEUCOVORIN CALCIUM INJECTION 350 MG
800.0000 mg | Freq: Once | INTRAVENOUS | Status: AC
  Administered 2018-08-24: 800 mg via INTRAVENOUS
  Filled 2018-08-24: qty 25

## 2018-08-24 MED ORDER — DEXTROSE 5 % IV SOLN
Freq: Once | INTRAVENOUS | Status: DC
  Filled 2018-08-24: qty 250

## 2018-08-24 MED ORDER — DEXTROSE 5 % IV SOLN
Freq: Once | INTRAVENOUS | Status: AC
  Administered 2018-08-24: 10:00:00 via INTRAVENOUS
  Filled 2018-08-24: qty 250

## 2018-08-24 MED ORDER — OXALIPLATIN CHEMO INJECTION 100 MG/20ML
85.0000 mg/m2 | Freq: Once | INTRAVENOUS | Status: AC
  Administered 2018-08-24: 170 mg via INTRAVENOUS
  Filled 2018-08-24: qty 34

## 2018-08-24 MED ORDER — DEXAMETHASONE SODIUM PHOSPHATE 10 MG/ML IJ SOLN
10.0000 mg | Freq: Once | INTRAMUSCULAR | Status: AC
  Administered 2018-08-24: 10 mg via INTRAVENOUS
  Filled 2018-08-24: qty 1

## 2018-08-24 MED ORDER — SODIUM CHLORIDE 0.9 % IV SOLN
2400.0000 mg/m2 | INTRAVENOUS | Status: DC
  Administered 2018-08-24: 4850 mg via INTRAVENOUS
  Filled 2018-08-24: qty 97

## 2018-08-24 MED ORDER — SODIUM CHLORIDE 0.9 % IV SOLN
INTRAVENOUS | Status: DC
  Administered 2018-08-24: 11:00:00 via INTRAVENOUS
  Filled 2018-08-24: qty 250

## 2018-08-24 NOTE — Progress Notes (Signed)
No new changes noted today 

## 2018-08-24 NOTE — Progress Notes (Signed)
Per Judeen Hammans RN per Dr. [name] okay to proceed with Avastin with urine protein of 18 collected on 08/10/18. Blood return noted before, during and after Adrucil push.

## 2018-08-24 NOTE — Progress Notes (Signed)
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 Patient is a 77 year old male who presented with evidence of bowel obstruction on 02/06/2017. At that time he had progressive abdominal distention and no bowel movement for about one week. CT abdomen showed an apple core lesion in the descending/sigmoid colon.  2. Patient underwent Hartmann's procedure for obstructing sigmoid colon mass on 02/06/2017. Preoperative CEA was 5.0  3. 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 It is 0 out of 15 lymph nodes were positive for malignancy. Perineural and lymphovascular invasion was present. Margins were negative.pT3N0. MMR stable.  4. Given that he had high risk stage II colon cancer including he presented with obstruction and has LVI and perineural invasion- adjuvant xeloda chemotherapy was recommended for 6 months.   5.Patient had evidence of hand foot syndrome after cycle 3. Cycle 4 delayed by 1 week. Topical urea cream prescribed  6. Patient completed 8 cycles of adjuvant xeloda in October 2018. polps seen in sigmoid and decending colon but negative for malignancy  7. Repeat Ct abdomen after 8 cycles showed no metastatic disease. 6 mm lung nodule noted in RLL  8.Patient had a repeat colonoscopy in October 2018 which showed polyps in his transverse and descending colon which were taken out and were negative for  high-grade dysplasia or malignancy. Patient subsequently underwent colostomy takedown procedure by Dr. Burt Knack  9. Repeat CT chest abdomen/ pelvis showed increase in the size of previously seen lung nodules largest being 1.4 cm consistent with metastatic disease. PET/CT showed mild uptake in 2 lung nodules. Others were not hypermetabolic. No other evidence of metastatic disease  10. Repeat lung biopsy consistent with colon adenocarcinoma. Comprehensive RAS panel testing showed mutant KRAS  11.Given the slow rate of growth of his colon cancer and desire to maintain his quality of life plan was to start chemotherapy after he gets a 87-monthbreak in summer. Repeat CT chest abdomen and pelvis in August 2019 showed mild increase in the size of his lung nodules. No new lung nodules are no new sites of distant metastatic disease.FOLFOX Avastin chemotherapy started on 06/22/2018   Interval history-denies any complaints today.  Denies any tingling numbness in his extremities except when he touches cold objects which later subsides.  ECOG PS- 1 Pain scale- 0 Opioid associated constipation- no  Review of systems- Review of Systems  Constitutional: Negative for chills, fever, malaise/fatigue and weight loss.  HENT: Negative for congestion, ear discharge and nosebleeds.   Eyes: Negative for blurred vision.  Respiratory: Negative for cough, hemoptysis, sputum production, shortness of breath and wheezing.   Cardiovascular: Negative for chest pain, palpitations, orthopnea and claudication.  Gastrointestinal: Negative for abdominal pain, blood in stool, constipation, diarrhea, heartburn, melena, nausea and vomiting.  Genitourinary: Negative for dysuria, flank pain, frequency, hematuria and urgency.  Musculoskeletal: Negative for back pain, joint pain and myalgias.  Skin: Negative for rash.  Neurological: Negative for dizziness, tingling, focal weakness, seizures, weakness and headaches.    Endo/Heme/Allergies: Does not  bruise/bleed easily.  Psychiatric/Behavioral: Negative for depression and suicidal ideas. The patient does not have insomnia.        No Known Allergies   Past Medical History:  Diagnosis Date  . Cancer ([place])    skin cancer-nose w graft,also shoulder- basal cell per pt  . Cataract    r eye  . Chronic kidney disease    kidney stones 20 y ago per pt  . Colon cancer ([place])   . Diabetes mellitus without complication ([place])   . Eczema   . History of kidney stones   . Hyperlipidemia   . Hypertension   . Vertigo      Past Surgical History:  Procedure Laterality Date  . CATARACT EXTRACTION Left   . COLECTOMY WITH COLOSTOMY CREATION/HARTMANN PROCEDURE N/A 02/06/2017   Procedure: COLECTOMY WITH COLOSTOMY CREATION/HARTMANN PROCEDURE;  Surgeon: [name], MD;  Location: [facility] ORS;  Service: General;  Laterality: N/A;  . COLON SURGERY    . COLONOSCOPY WITH PROPOFOL N/A 09/04/2017   Procedure: COLONOSCOPY WITH PROPOFOL;  Surgeon: Jonathon Bellows, MD;  Location[facility] END[facility]COPY;  Service: Gastroenterology;  Laterality: N/A;  . COLOSTOMY CLOSURE N/A 10/20/2017   Procedure: COLOSTOMY CLOSURE;  Surgeon: [name], MD;  Location: [facility] ORS;  Service: General;  Laterality: N/A;  . CYSTOSCOPY    . FLEXIBLE SIGMOIDOSCOPY N/A 02/06/2017   Procedure: FLEXIBLE SIGMOIDOSCOPY;  Surgeon: Christene Lye, MD;  Location: [facility] ENDOSCOPY;  Service: Endoscopy;  Laterality: N/A;  . PORTACATH PLACEMENT N/A 06/17/2018   Procedure: INSERTION PORT-A-CATH, WITH FLUOROSCOPY;  Surgeon: [name], MD;  Location: [facility] ORS;  Service: General;  Laterality: N/A;    Social History   Socioeconomic History  . Marital status: Married    Spouse name: Not on file  . Number of children: Not on file  . Years of education: Not on file  . Highest education level: Not on file  Occupational History  . Not on file  Social Needs  . Financial resource strain: Not on file  .  Food insecurity:    Worry: Not on file    Inability: Not on file  . Transportation needs:    Medical: Not on file    Non-medical: Not on file  Tobacco Use  . Smoking status: Never Smoker  . Smokeless tobacco: Never Used  Substance and Sexual Activity  . Alcohol use: No  . Drug use: No  . Sexual activity: Not Currently  Lifestyle  . Physical activity:    Days per week: Not on file    Minutes per session: Not on file  . Stress: Not on file  Relationships  . Social connections:    Talks on phone: Not on file    Gets together: Not on file    Attends religious service: Not on file    Active member of club or organization: Not on file    Attends meetings of clubs or organizations: Not on file    Relationship status: Not on file  . Intimate partner violence:    Fear of current or ex partner: Not on file    Emotionally abused: Not on file    Physically abused: Not on file    Forced sexual activity: Not on file  Other Topics Concern  . Not on file  Social History Narrative  . Not on file    Family History  Problem Relation Age of Onset  . Diabetes Mother   . AAA (abdominal aortic aneurysm) Mother   .  Coronary artery disease Mother   . Diabetes Father   . Coronary artery disease Father   . Dementia Father   . Breast cancer Sister      Current Outpatient Medications:  .  acetaminophen (TYLENOL) 500 MG tablet, Take 500 mg by mouth every 6 (six) hours as needed (for pain.)., Disp: , Rfl:  .  aspirin EC 81 MG tablet, Take 81 mg by mouth daily., Disp: , Rfl:  .  glimepiride (AMARYL) 2 MG tablet, Take 8 mg by mouth daily with supper. , Disp: , Rfl:  .  glimepiride (AMARYL) 4 MG tablet, Take 4 mg by mouth 2 (two) times daily., Disp: , Rfl:  .  HYDROcodone-Acetaminophen (VICODIN) 5-300 MG TABS, Take 1 tablet by mouth every 4 (four) hours as needed. (Patient not taking: Reported on 07/06/2018), Disp: 15 each, Rfl: 0 .  lidocaine-prilocaine (EMLA) cream, Place a small amount over  cream over port 1 hour before each treatment, cover cream with saran wrap for clothing protection (Patient not taking: Reported on 08/10/2018), Disp: 30 g, Rfl: 1 .  loratadine (CLARITIN) 10 MG tablet, Take 10 mg by mouth daily., Disp: , Rfl:  .  losartan (COZAAR) 25 MG tablet, Take 25 mg by mouth daily., Disp: , Rfl:  .  metFORMIN (GLUCOPHAGE) 1000 MG tablet, Take 1,000-1,500 tablets by mouth See admin instructions. TAKE 1 TABLET (1000 MG) BY MOUTH IN THE MORNING & TAKE 1.5 TABLETS (1500 MG) BY MOUTH AT SUPPER, Disp: , Rfl:  .  ondansetron (ZOFRAN) 8 MG tablet, Take 1 tablet (8 mg total) by mouth 2 (two) times daily as needed for refractory nausea / vomiting. Start on day 3 after chemotherapy. (Patient not taking: Reported on 08/10/2018), Disp: 30 tablet, Rfl: 1 .  pravastatin (PRAVACHOL) 20 MG tablet, Take 20 mg by mouth daily with supper. , Disp: , Rfl:  .  prochlorperazine (COMPAZINE) 10 MG tablet, Take 1 tablet (10 mg total) by mouth every 6 (six) hours as needed (Nausea or vomiting). (Patient not taking: Reported on 08/10/2018), Disp: 30 tablet, Rfl: 1 .  triamcinolone (KENALOG) 0.025 % cream, Apply 1 application topically daily as needed (FOR EZCEMA). , Disp: , Rfl:  No current facility-administered medications for this visit.   Facility-Administered Medications Ordered in Other Visits:  .  0.9 %  sodium chloride infusion, , Intravenous, Continuous, Sindy Guadeloupe, MD, Stopped at 07/27/18 1037 .  heparin lock flush 100 unit/mL, 500 Units, Intravenous, Once, Sindy Guadeloupe, MD .  sodium chloride flush (NS) 0.9 % injection 10 mL, 10 mL, Intravenous, PRN, Sindy Guadeloupe, MD, 10 mL at 07/27/18 0920  Physical exam:  Vitals:   08/24/18 0852  BP: (!) 177/74  Pulse: 71  Temp: 97.6 F (36.4 C)  TempSrc: Tympanic  SpO2: 99%  Weight: 188 lb 3.2 oz (85.4 kg)  Height: '5\' 6"'  (1.676 m)   Physical Exam  Constitutional: He is oriented to person, place, and time.  HENT:  Head: Normocephalic and  atraumatic.  Eyes: Pupils are equal, round, and reactive to light. EOM are normal.  Neck: Normal range of motion.  Cardiovascular: Normal rate, regular rhythm and normal heart sounds.  Pulmonary/Chest: Effort normal and breath sounds normal.  Abdominal: Soft. Bowel sounds are normal.  Neurological: He is alert and oriented to person, place, and time.  Skin: Skin is warm and dry.     CMP Latest Ref Rng & Units 08/24/2018  Glucose 70 - 99 mg/dL 199(H)  BUN 8 - 23  mg/dL 14  Creatinine 0.61 - 1.24 mg/dL 0.98  Sodium 135 - 145 mmol/L 140  Potassium 3.5 - 5.1 mmol/L 4.1  Chloride 98 - 111 mmol/L 108  CO2 22 - 32 mmol/L 22  Calcium 8.9 - 10.3 mg/dL 9.4  Total Protein 6.5 - 8.1 g/dL 7.0  Total Bilirubin 0.3 - 1.2 mg/dL 0.5  Alkaline Phos 38 - 126 U/L 68  AST 15 - 41 U/L 21  ALT 0 - 44 U/L 16   CBC Latest Ref Rng & Units 08/24/2018  WBC 4.0 - 10.5 K/uL 4.1  Hemoglobin 13.0 - 17.0 g/dL 12.2(L)  Hematocrit 39.0 - 52.0 % 37.4(L)  Platelets 150 - 400 K/uL 170      Assessment and plan- Patient is a 77 y.o. 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  He is here for on treatment assessment prior to cycle 5 of FOLFOX Avastin chemotherapy  Counts okay to proceed with cycle 5 of FOLFOX Avastin chemotherapy today.  He will be receiving Neulasta on day 3 of pump disconnect  He has also been keeping a tab of his blood pressures at home and reports that his readings never exceed a systolic blood pressure of 140.  We did repeat his blood pressure before giving him a Avastin today and his blood pressure systolic was 391.  Urine protein remains trace.  He can therefore get a Avastin today  I will see him back in 2 weeks time with CBC and CMP for cycle 6 of chemotherapy.  I will plan to get scans after cycle 6   Visit Diagnosis 1. Encounter for antineoplastic chemotherapy   2. 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[place])   3. Malignant neoplasm metastatic to lung, unspecified laterality [facility])       Dr. Randa Evens, MD, MPH [facility] at [facility] [phone] 08/24/2018 12:34 PM

## 2018-08-26 ENCOUNTER — Inpatient Hospital Stay: Payer: Medicare Other

## 2018-08-26 VITALS — BP 156/72 | HR 70 | Temp 97.0°F | Resp 18

## 2018-08-26 DIAGNOSIS — C7800 Secondary malignant neoplasm of unspecified lung: Secondary | ICD-10-CM

## 2018-08-26 DIAGNOSIS — C189 Malignant neoplasm of colon, unspecified: Secondary | ICD-10-CM

## 2018-08-26 DIAGNOSIS — Z5111 Encounter for antineoplastic chemotherapy: Secondary | ICD-10-CM | POA: Diagnosis not present

## 2018-08-26 MED ORDER — SODIUM CHLORIDE 0.9% FLUSH
10.0000 mL | INTRAVENOUS | Status: DC | PRN
  Administered 2018-08-26: 10 mL
  Filled 2018-08-26: qty 10

## 2018-08-26 MED ORDER — PEGFILGRASTIM-CBQV 6 MG/0.6ML ~~LOC~~ SOSY
6.0000 mg | PREFILLED_SYRINGE | Freq: Once | SUBCUTANEOUS | Status: AC
  Administered 2018-08-26: 6 mg via SUBCUTANEOUS
  Filled 2018-08-26: qty 0.6

## 2018-08-26 MED ORDER — HEPARIN SOD (PORK) LOCK FLUSH 100 UNIT/ML IV SOLN
500.0000 [IU] | Freq: Once | INTRAVENOUS | Status: AC | PRN
  Administered 2018-08-26: 500 [IU]
  Filled 2018-08-26: qty 5

## 2018-09-06 NOTE — Progress Notes (Signed)
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 Patient is a 77 year old male who presented with evidence of bowel obstruction on 02/06/2017. At that time he had progressive abdominal distention and no bowel movement for about one week. CT abdomen showed an apple core lesion in the descending/sigmoid colon.  2. Patient underwent Hartmann's procedure for obstructing sigmoid colon mass on 02/06/2017. Preoperative CEA was 5.0  3. 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 It is 0 out of 15 lymph nodes were positive for malignancy. Perineural and lymphovascular invasion was present. Margins were negative.pT3N0. MMR stable.  4. Given that he had high risk stage II colon cancer including he presented with obstruction and has LVI and perineural invasion- adjuvant xeloda chemotherapy was recommended for 6 months.   5.Patient had evidence of hand foot syndrome after cycle 3. Cycle 4 delayed by 1 week. Topical urea cream prescribed  6. Patient completed 8 cycles of adjuvant xeloda in October 2018. polps seen in sigmoid and decending colon but negative for malignancy  7. Repeat Ct abdomen after 8 cycles showed no metastatic disease. 6 mm lung nodule noted in RLL  8.Patient had a repeat colonoscopy in October 2018 which showed polyps in his transverse and descending colon which were taken out and were negative for  high-grade dysplasia or malignancy. Patient subsequently underwent colostomy takedown procedure by Dr. Burt Knack  9. Repeat CT chest abdomen/ pelvis showed increase in the size of previously seen lung nodules largest being 1.4 cm consistent with metastatic disease. PET/CT showed mild uptake in 2 lung nodules. Others were not hypermetabolic. No other evidence of metastatic disease  10. Repeat lung biopsy consistent with colon adenocarcinoma. 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 Repeat CT chest abdomen and pelvis in August 2019 showed mild increase in the size of his lung nodules. No new lung nodules are no new sites of distant metastatic disease.FOLFOX Avastin chemotherapy started on 06/22/2018   Interval history-he is doing well and reports some tingling numbness when he is exposed to cold which is intermittent and goes away.  Denies any fatigue nausea vomiting.  His bowel habits are regular.  He has been keeping a tab of his blood pressure readings at home which has been running in the 1629Uand diastolic has been running between 70s to 80s  ECOG PS- 1 Pain scale- 0 Opioid associated constipation- no  Review of systems- Review of Systems  Constitutional: Negative for chills, fever, malaise/fatigue and weight loss.  HENT: Negative for congestion, ear discharge and nosebleeds.   Eyes: Negative for blurred vision.  Respiratory: Negative for cough, hemoptysis, sputum production, shortness of breath and wheezing.   Cardiovascular: Negative for chest pain, palpitations, orthopnea and claudication.  Gastrointestinal: Negative for abdominal pain, blood in stool, constipation, diarrhea, heartburn, melena, nausea and vomiting.  Genitourinary: Negative for  dysuria, flank pain, frequency, hematuria and urgency.    Musculoskeletal: Negative for back pain, joint pain and myalgias.  Skin: Negative for rash.  Neurological: Negative for dizziness, tingling, focal weakness, seizures, weakness and headaches.  Endo/Heme/Allergies: Does not bruise/bleed easily.  Psychiatric/Behavioral: Negative for depression and suicidal ideas. The patient does not have insomnia.       No Known Allergies   Past Medical History:  Diagnosis Date  . Cancer ([place])    skin cancer-nose w graft,also shoulder- basal cell per pt  . Cataract    r eye  . Chronic kidney disease    kidney stones 20 y ago per pt  . Colon cancer ([place])   . Diabetes mellitus without complication ([place])   . Eczema   . History of kidney stones   . Hyperlipidemia   . Hypertension   . Vertigo      Past Surgical History:  Procedure Laterality Date  . CATARACT EXTRACTION Left   . COLECTOMY WITH COLOSTOMY CREATION/HARTMANN PROCEDURE N/A 02/06/2017   Procedure: COLECTOMY WITH COLOSTOMY CREATION/HARTMANN PROCEDURE;  Surgeon: [name], MD;  Location: [facility] ORS;  Service: General;  Laterality: N/A;  . COLON SURGERY    . COLONOSCOPY WITH PROPOFOL N/A 09/04/2017   Procedure: COLONOSCOPY WITH PROPOFOL;  Surgeon: Jonathon Bellows, MD;  Location: [facility] ENDOSCOPY;  Service: Gastroenterology;  Laterality: N/A;  . COLOSTOMY CLOSURE N/A 10/20/2017   Procedure: COLOSTOMY CLOSURE;  Surgeon: [name], MD;  Location: [facility] ORS;  Service: General;  Laterality: N/A;  . CYSTOSCOPY    . FLEXIBLE SIGMOIDOSCOPY N/A 02/06/2017   Procedure: FLEXIBLE SIGMOIDOSCOPY;  Surgeon: Christene Lye, MD;  Location: [facility] ENDOSCOPY;  Service: Endoscopy;  Laterality: N/A;  . PORTACATH PLACEMENT N/A 06/17/2018   Procedure: INSERTION PORT-A-CATH, WITH FLUOROSCOPY;  Surgeon: [name], MD;  Location: [facility] ORS;  Service: General;  Laterality: N/A;    Social History   Socioeconomic History  . Marital status: Married    Spouse name: Not on file  . Number of  children: Not on file  . Years of education: Not on file  . Highest education level: Not on file  Occupational History  . Not on file  Social Needs  . Financial resource strain: Not on file  . Food insecurity:    Worry: Not on file    Inability: Not on file  . Transportation needs:    Medical: Not on file    Non-medical: Not on file  Tobacco Use  . Smoking status: Never Smoker  . Smokeless tobacco: Never Used  Substance and Sexual Activity  . Alcohol use: No  . Drug use: No  . Sexual activity: Not Currently  Lifestyle  . Physical activity:    Days per week: Not on file    Minutes per session: Not on file  . Stress: Not on file  Relationships  . Social connections:    Talks on phone: Not on file    Gets together: Not on file    Attends religious service: Not on file    Active member of club or organization: Not on file    Attends meetings of clubs or organizations: Not on file    Relationship status: Not on file  . Intimate partner violence:    Fear of current or ex partner: Not on file    Emotionally abused: Not on file    Physically abused: Not on file    Forced sexual activity: Not on file  Other Topics Concern  .  Not on file  Social History Narrative  . Not on file    Family History  Problem Relation Age of Onset  . Diabetes Mother   . AAA (abdominal aortic aneurysm) Mother   . Coronary artery disease Mother   . Diabetes Father   . Coronary artery disease Father   . Dementia Father   . Breast cancer Sister      Current Outpatient Medications:  .  acetaminophen (TYLENOL) 500 MG tablet, Take 500 mg by mouth every 6 (six) hours as needed (for pain.)., Disp: , Rfl:  .  aspirin EC 81 MG tablet, Take 81 mg by mouth daily., Disp: , Rfl:  .  glimepiride (AMARYL) 2 MG tablet, Take 8 mg by mouth daily with supper. , Disp: , Rfl:  .  glimepiride (AMARYL) 4 MG tablet, Take 4 mg by mouth 2 (two) times daily., Disp: , Rfl:  .  HYDROcodone-Acetaminophen (VICODIN)  5-300 MG TABS, Take 1 tablet by mouth every 4 (four) hours as needed. (Patient not taking: Reported on 07/06/2018), Disp: 15 each, Rfl: 0 .  lidocaine-prilocaine (EMLA) cream, Place a small amount over cream over port 1 hour before each treatment, cover cream with saran wrap for clothing protection (Patient not taking: Reported on 08/10/2018), Disp: 30 g, Rfl: 1 .  loratadine (CLARITIN) 10 MG tablet, Take 10 mg by mouth daily., Disp: , Rfl:  .  losartan (COZAAR) 25 MG tablet, Take 25 mg by mouth daily., Disp: , Rfl:  .  metFORMIN (GLUCOPHAGE) 1000 MG tablet, Take 1,000-1,500 tablets by mouth See admin instructions. TAKE 1 TABLET (1000 MG) BY MOUTH IN THE MORNING & TAKE 1.5 TABLETS (1500 MG) BY MOUTH AT SUPPER, Disp: , Rfl:  .  ondansetron (ZOFRAN) 8 MG tablet, Take 1 tablet (8 mg total) by mouth 2 (two) times daily as needed for refractory nausea / vomiting. Start on day 3 after chemotherapy. (Patient not taking: Reported on 08/10/2018), Disp: 30 tablet, Rfl: 1 .  pravastatin (PRAVACHOL) 20 MG tablet, Take 20 mg by mouth daily with supper. , Disp: , Rfl:  .  prochlorperazine (COMPAZINE) 10 MG tablet, Take 1 tablet (10 mg total) by mouth every 6 (six) hours as needed (Nausea or vomiting). (Patient not taking: Reported on 08/10/2018), Disp: 30 tablet, Rfl: 1 .  triamcinolone (KENALOG) 0.025 % cream, Apply 1 application topically daily as needed (FOR EZCEMA). , Disp: , Rfl:  No current facility-administered medications for this visit.   Facility-Administered Medications Ordered in Other Visits:  .  0.9 %  sodium chloride infusion, , Intravenous, Continuous, Sindy Guadeloupe, MD, Stopped at 07/27/18 1037 .  heparin lock flush 100 unit/mL, 500 Units, Intravenous, Once, Sindy Guadeloupe, MD .  sodium chloride flush (NS) 0.9 % injection 10 mL, 10 mL, Intravenous, PRN, Sindy Guadeloupe, MD, 10 mL at 07/27/18 0920  Physical exam:  Vitals:   09/07/18 0856  BP: (!) 157/80  Pulse: 76  Resp: 18  Temp: (!) 97.5 F  (36.4 C)  TempSrc: Tympanic  SpO2: 96%  Weight: 187 lb (84.8 kg)  Height: '5\' 6"'  (1.676 m)   Physical Exam  Constitutional: He is oriented to person, place, and time. He appears well-developed and well-nourished.  HENT:  Head: Normocephalic and atraumatic.  Eyes: Pupils are equal, round, and reactive to light. EOM are normal.  Neck: Normal range of motion.  Cardiovascular: Normal rate, regular rhythm and normal heart sounds.  Pulmonary/Chest: Effort normal and breath sounds normal.  Abdominal: Soft. Bowel  sounds are normal.  Neurological: He is alert and oriented to person, place, and time.  Skin: Skin is warm and dry.     CMP Latest Ref Rng & Units 09/07/2018  Glucose 70 - 99 mg/dL 251(H)  BUN 8 - 23 mg/dL 15  Creatinine 0.61 - 1.24 mg/dL 1.14  Sodium 135 - 145 mmol/L 136  Potassium 3.5 - 5.1 mmol/L 3.8  Chloride 98 - 111 mmol/L 106  CO2 22 - 32 mmol/L 21(L)  Calcium 8.9 - 10.3 mg/dL 9.1  Total Protein 6.5 - 8.1 g/dL 6.9  Total Bilirubin 0.3 - 1.2 mg/dL 0.5  Alkaline Phos 38 - 126 U/L 94  AST 15 - 41 U/L 27  ALT 0 - 44 U/L 15   CBC Latest Ref Rng & Units 09/07/2018  WBC 4.0 - 10.5 K/uL 10.1  Hemoglobin 13.0 - 17.0 g/dL 12.4(L)  Hematocrit 39.0 - 52.0 % 38.7(L)  Platelets 150 - 400 K/uL 115(L)    Assessment and plan- Patient is a 77 y.o. 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   He is here for on treatment assessment prior to cycle 6 of FOLFOX Avastin chemotherapy  Counts okay to proceed with cycle 6 of FOLFOX Avastin chemotherapy today.  His urine protein is trace and his blood pressure is stable.  No leg edema.  He is scheduled to get repeat CT chest abdomen pelvis on 09/17/2018.  I will see him back in 2 weeks time with repeat CBC and CMP for cycle 7 of FOLFOX Avastin chemotherapy  Patient is mild thrombocytopenia and his platelet count is 115 likely secondary to oxaliplatin.  I have dose reduce oxaliplatin to 65 mg/m today.  Continue to  monitor  He should get his flu shot 3 to 4 days prior to his chemotherapy   Visit Diagnosis 1. Encounter for antineoplastic chemotherapy   2. Malignant neoplasm of sigmoid colon ([place])   3. Chemotherapy-induced thrombocytopenia      Dr. Randa Evens, MD, MPH [facility] [phone] 09/06/2018 1:28 PM

## 2018-09-07 ENCOUNTER — Inpatient Hospital Stay: Payer: Medicare Other

## 2018-09-07 ENCOUNTER — Inpatient Hospital Stay (HOSPITAL_BASED_OUTPATIENT_CLINIC_OR_DEPARTMENT_OTHER): Payer: Medicare Other | Admitting: Oncology

## 2018-09-07 ENCOUNTER — Encounter: Payer: Self-pay | Admitting: Oncology

## 2018-09-07 VITALS — BP 157/80 | HR 76 | Temp 97.5°F | Resp 18 | Ht 66.0 in | Wt 187.0 lb

## 2018-09-07 DIAGNOSIS — C187 Malignant neoplasm of sigmoid colon: Secondary | ICD-10-CM | POA: Diagnosis not present

## 2018-09-07 DIAGNOSIS — D6959 Other secondary thrombocytopenia: Secondary | ICD-10-CM | POA: Diagnosis not present

## 2018-09-07 DIAGNOSIS — C189 Malignant neoplasm of colon, unspecified: Secondary | ICD-10-CM

## 2018-09-07 DIAGNOSIS — E119 Type 2 diabetes mellitus without complications: Secondary | ICD-10-CM

## 2018-09-07 DIAGNOSIS — I10 Essential (primary) hypertension: Secondary | ICD-10-CM

## 2018-09-07 DIAGNOSIS — C7800 Secondary malignant neoplasm of unspecified lung: Secondary | ICD-10-CM

## 2018-09-07 DIAGNOSIS — Z5111 Encounter for antineoplastic chemotherapy: Secondary | ICD-10-CM

## 2018-09-07 DIAGNOSIS — T451X5A Adverse effect of antineoplastic and immunosuppressive drugs, initial encounter: Secondary | ICD-10-CM

## 2018-09-07 LAB — COMPREHENSIVE METABOLIC PANEL
ALT: 15 U/L (ref 0–44)
AST: 27 U/L (ref 15–41)
Albumin: 4 g/dL (ref 3.5–5.0)
Alkaline Phosphatase: 94 U/L (ref 38–126)
Anion gap: 9 (ref 5–15)
BUN: 15 mg/dL (ref 8–23)
CO2: 21 mmol/L — ABNORMAL LOW (ref 22–32)
CREATININE: 1.14 mg/dL (ref 0.61–1.24)
Calcium: 9.1 mg/dL (ref 8.9–10.3)
Chloride: 106 mmol/L (ref 98–111)
GFR calc Af Amer: >60 mL/min (ref >60)
GFR, EST NON AFRICAN AMERICAN: 60 mL/min — AB (ref >60)
Glucose, Bld: 251 mg/dL — ABNORMAL HIGH (ref 70–99)
POTASSIUM: 3.8 mmol/L (ref 3.5–5.1)
Sodium: 136 mmol/L (ref 135–145)
TOTAL PROTEIN: 6.9 g/dL (ref 6.5–8.1)
Total Bilirubin: 0.5 mg/dL (ref 0.3–1.2)

## 2018-09-07 LAB — CBC WITH DIFFERENTIAL/PLATELET
Abs Immature Granulocytes: 0.4 10*3/uL — ABNORMAL HIGH (ref 0.00–0.07)
BASOS PCT: 1 %
Basophils Absolute: 0.1 10*3/uL (ref 0.0–0.1)
EOS ABS: 0.1 10*3/uL (ref 0.0–0.5)
EOS PCT: 1 %
HCT: 38.7 % — ABNORMAL LOW (ref 39.0–52.0)
Hemoglobin: 12.4 g/dL — ABNORMAL LOW (ref 13.0–17.0)
Immature Granulocytes: 4 %
Lymphocytes Relative: 28 %
Lymphs Abs: 2.8 10*3/uL (ref 0.7–4.0)
MCH: 29.2 pg (ref 26.0–34.0)
MCHC: 32 g/dL (ref 30.0–36.0)
MCV: 91.1 fL (ref 80.0–100.0)
MONO ABS: 0.7 10*3/uL (ref 0.1–1.0)
Monocytes Relative: 7 %
NEUTROS ABS: 6 10*3/uL (ref 1.7–7.7)
Neutrophils Relative %: 59 %
PLATELETS: 115 10*3/uL — AB (ref 150–400)
RBC: 4.25 MIL/uL (ref 4.22–5.81)
RDW: 16 % — AB (ref 11.5–15.5)
WBC: 10.1 10*3/uL (ref 4.0–10.5)
nRBC: 0.4 % — ABNORMAL HIGH (ref 0.0–0.2)

## 2018-09-07 LAB — PROTEIN, URINE, RANDOM: TOTAL PROTEIN, URINE: 19 mg/dL

## 2018-09-07 MED ORDER — OXALIPLATIN CHEMO INJECTION 100 MG/20ML
65.0000 mg/m2 | Freq: Once | INTRAVENOUS | Status: AC
  Administered 2018-09-07: 130 mg via INTRAVENOUS
  Filled 2018-09-07: qty 20

## 2018-09-07 MED ORDER — SODIUM CHLORIDE 0.9 % IV SOLN
5.0000 mg/kg | Freq: Once | INTRAVENOUS | Status: AC
  Administered 2018-09-07: 450 mg via INTRAVENOUS
  Filled 2018-09-07: qty 16

## 2018-09-07 MED ORDER — SODIUM CHLORIDE 0.9% FLUSH
10.0000 mL | INTRAVENOUS | Status: DC | PRN
  Administered 2018-09-07: 10 mL via INTRAVENOUS
  Filled 2018-09-07: qty 10

## 2018-09-07 MED ORDER — HEPARIN SOD (PORK) LOCK FLUSH 100 UNIT/ML IV SOLN
500.0000 [IU] | Freq: Once | INTRAVENOUS | Status: DC
  Filled 2018-09-07: qty 5

## 2018-09-07 MED ORDER — DEXAMETHASONE SODIUM PHOSPHATE 10 MG/ML IJ SOLN
10.0000 mg | Freq: Once | INTRAMUSCULAR | Status: AC
  Administered 2018-09-07: 10 mg via INTRAVENOUS
  Filled 2018-09-07: qty 1

## 2018-09-07 MED ORDER — DEXTROSE 5 % IV SOLN
Freq: Once | INTRAVENOUS | Status: AC
  Administered 2018-09-07: 11:00:00 via INTRAVENOUS
  Filled 2018-09-07: qty 250

## 2018-09-07 MED ORDER — SODIUM CHLORIDE 0.9 % IV SOLN
INTRAVENOUS | Status: DC
  Administered 2018-09-07: 10:00:00 via INTRAVENOUS
  Filled 2018-09-07: qty 250

## 2018-09-07 MED ORDER — LEUCOVORIN CALCIUM INJECTION 350 MG
800.0000 mg | Freq: Once | INTRAVENOUS | Status: AC
  Administered 2018-09-07: 800 mg via INTRAVENOUS
  Filled 2018-09-07: qty 25

## 2018-09-07 MED ORDER — FLUOROURACIL CHEMO INJECTION 2.5 GM/50ML
400.0000 mg/m2 | Freq: Once | INTRAVENOUS | Status: AC
  Administered 2018-09-07: 800 mg via INTRAVENOUS
  Filled 2018-09-07: qty 16

## 2018-09-07 MED ORDER — PALONOSETRON HCL INJECTION 0.25 MG/5ML
0.2500 mg | Freq: Once | INTRAVENOUS | Status: AC
  Administered 2018-09-07: 0.25 mg via INTRAVENOUS
  Filled 2018-09-07: qty 5

## 2018-09-07 MED ORDER — SODIUM CHLORIDE 0.9 % IV SOLN
2400.0000 mg/m2 | INTRAVENOUS | Status: DC
  Administered 2018-09-07: 4850 mg via INTRAVENOUS
  Filled 2018-09-07: qty 97

## 2018-09-07 NOTE — Progress Notes (Signed)
No new changes noted today 

## 2018-09-09 ENCOUNTER — Inpatient Hospital Stay: Payer: Medicare Other

## 2018-09-09 VITALS — BP 128/73 | HR 69 | Temp 97.4°F | Resp 18

## 2018-09-09 DIAGNOSIS — Z5111 Encounter for antineoplastic chemotherapy: Secondary | ICD-10-CM | POA: Diagnosis not present

## 2018-09-09 DIAGNOSIS — C7800 Secondary malignant neoplasm of unspecified lung: Secondary | ICD-10-CM

## 2018-09-09 DIAGNOSIS — C189 Malignant neoplasm of colon, unspecified: Secondary | ICD-10-CM

## 2018-09-09 MED ORDER — PEGFILGRASTIM-CBQV 6 MG/0.6ML ~~LOC~~ SOSY
6.0000 mg | PREFILLED_SYRINGE | Freq: Once | SUBCUTANEOUS | Status: AC
  Administered 2018-09-09: 6 mg via SUBCUTANEOUS
  Filled 2018-09-09: qty 0.6

## 2018-09-09 MED ORDER — SODIUM CHLORIDE 0.9% FLUSH
10.0000 mL | INTRAVENOUS | Status: DC | PRN
  Administered 2018-09-09: 10 mL
  Filled 2018-09-09: qty 10

## 2018-09-09 MED ORDER — HEPARIN SOD (PORK) LOCK FLUSH 100 UNIT/ML IV SOLN
500.0000 [IU] | Freq: Once | INTRAVENOUS | Status: AC | PRN
  Administered 2018-09-09: 500 [IU]
  Filled 2018-09-09: qty 5

## 2018-09-13 ENCOUNTER — Other Ambulatory Visit: Payer: Medicare Other

## 2018-09-13 ENCOUNTER — Ambulatory Visit: Payer: Medicare Other

## 2018-09-16 ENCOUNTER — Ambulatory Visit: Payer: Medicare Other | Admitting: Oncology

## 2018-09-16 ENCOUNTER — Inpatient Hospital Stay: Payer: Medicare Other | Attending: Oncology

## 2018-09-16 DIAGNOSIS — C7801 Secondary malignant neoplasm of right lung: Secondary | ICD-10-CM | POA: Insufficient documentation

## 2018-09-16 DIAGNOSIS — Z5111 Encounter for antineoplastic chemotherapy: Secondary | ICD-10-CM | POA: Diagnosis present

## 2018-09-16 DIAGNOSIS — Z23 Encounter for immunization: Secondary | ICD-10-CM | POA: Diagnosis not present

## 2018-09-16 DIAGNOSIS — N179 Acute kidney failure, unspecified: Secondary | ICD-10-CM | POA: Insufficient documentation

## 2018-09-16 DIAGNOSIS — Z5189 Encounter for other specified aftercare: Secondary | ICD-10-CM | POA: Insufficient documentation

## 2018-09-16 DIAGNOSIS — C187 Malignant neoplasm of sigmoid colon: Secondary | ICD-10-CM | POA: Insufficient documentation

## 2018-09-16 DIAGNOSIS — C7802 Secondary malignant neoplasm of left lung: Secondary | ICD-10-CM | POA: Insufficient documentation

## 2018-09-16 MED ORDER — INFLUENZA VAC SPLIT HIGH-DOSE 0.5 ML IM SUSY: 0.5000 mL | PREFILLED_SYRINGE | INTRAMUSCULAR | Status: DC

## 2018-09-16 MED ORDER — INFLUENZA VAC SPLIT HIGH-DOSE 0.5 ML IM SUSY
0.5000 mL | PREFILLED_SYRINGE | Freq: Once | INTRAMUSCULAR | Status: AC
  Administered 2018-09-16: 0.5 mL via INTRAMUSCULAR
  Filled 2018-09-16: qty 0.5

## 2018-09-17 ENCOUNTER — Ambulatory Visit
Admission: RE | Admit: 2018-09-17 | Discharge: 2018-09-17 | Disposition: A | Discharge status: Home / Self Care | Payer: Medicare Other | Source: Ambulatory Visit | Attending: Oncology | Admitting: Oncology

## 2018-09-17 DIAGNOSIS — K5730 Diverticulosis of large intestine without perforation or abscess without bleeding: Secondary | ICD-10-CM | POA: Diagnosis not present

## 2018-09-17 DIAGNOSIS — C7800 Secondary malignant neoplasm of unspecified lung: Secondary | ICD-10-CM | POA: Diagnosis not present

## 2018-09-17 DIAGNOSIS — I700 Atherosclerosis of aorta: Secondary | ICD-10-CM | POA: Insufficient documentation

## 2018-09-17 DIAGNOSIS — C189 Malignant neoplasm of colon, unspecified: Secondary | ICD-10-CM | POA: Diagnosis present

## 2018-09-17 DIAGNOSIS — K8020 Calculus of gallbladder without cholecystitis without obstruction: Secondary | ICD-10-CM | POA: Diagnosis not present

## 2018-09-17 MED ORDER — IOPAMIDOL (ISOVUE-300) INJECTION 61%
100.0000 mL | Freq: Once | INTRAVENOUS | Status: AC | PRN
  Administered 2018-09-17: 100 mL via INTRAVENOUS

## 2018-09-21 ENCOUNTER — Inpatient Hospital Stay: Payer: Medicare Other

## 2018-09-21 ENCOUNTER — Inpatient Hospital Stay (HOSPITAL_BASED_OUTPATIENT_CLINIC_OR_DEPARTMENT_OTHER): Payer: Medicare Other | Admitting: Oncology

## 2018-09-21 ENCOUNTER — Encounter: Payer: Self-pay | Admitting: Oncology

## 2018-09-21 VITALS — BP 152/72 | HR 73 | Temp 97.9°F | Resp 18 | Ht 66.0 in | Wt 186.0 lb

## 2018-09-21 DIAGNOSIS — C7801 Secondary malignant neoplasm of right lung: Secondary | ICD-10-CM

## 2018-09-21 DIAGNOSIS — C189 Malignant neoplasm of colon, unspecified: Secondary | ICD-10-CM

## 2018-09-21 DIAGNOSIS — N179 Acute kidney failure, unspecified: Secondary | ICD-10-CM | POA: Diagnosis not present

## 2018-09-21 DIAGNOSIS — Z5111 Encounter for antineoplastic chemotherapy: Secondary | ICD-10-CM

## 2018-09-21 DIAGNOSIS — C7802 Secondary malignant neoplasm of left lung: Secondary | ICD-10-CM

## 2018-09-21 DIAGNOSIS — C187 Malignant neoplasm of sigmoid colon: Secondary | ICD-10-CM

## 2018-09-21 DIAGNOSIS — C7800 Secondary malignant neoplasm of unspecified lung: Secondary | ICD-10-CM

## 2018-09-21 LAB — CBC WITH DIFFERENTIAL/PLATELET
Abs Immature Granulocytes: 0.15 10*3/uL — ABNORMAL HIGH (ref 0.00–0.07)
BASOS ABS: 0.1 10*3/uL (ref 0.0–0.1)
BASOS PCT: 1 %
Eosinophils Absolute: 0.2 10*3/uL (ref 0.0–0.5)
Eosinophils Relative: 2 %
HCT: 39.6 % (ref 39.0–52.0)
HEMOGLOBIN: 12.5 g/dL — AB (ref 13.0–17.0)
Immature Granulocytes: 1 %
LYMPHS PCT: 28 %
Lymphs Abs: 3.2 10*3/uL (ref 0.7–4.0)
MCH: 28.9 pg (ref 26.0–34.0)
MCHC: 31.6 g/dL (ref 30.0–36.0)
MCV: 91.7 fL (ref 80.0–100.0)
Monocytes Absolute: 0.8 10*3/uL (ref 0.1–1.0)
Monocytes Relative: 7 %
NEUTROS ABS: 6.9 10*3/uL (ref 1.7–7.7)
NRBC: 0.2 % (ref 0.0–0.2)
Neutrophils Relative %: 61 %
PLATELETS: 149 10*3/uL — AB (ref 150–400)
RBC: 4.32 MIL/uL (ref 4.22–5.81)
RDW: 16.7 % — ABNORMAL HIGH (ref 11.5–15.5)
WBC: 11.4 10*3/uL — AB (ref 4.0–10.5)

## 2018-09-21 LAB — COMPREHENSIVE METABOLIC PANEL
ALT: 17 U/L (ref 0–44)
AST: 25 U/L (ref 15–41)
Albumin: 4.1 g/dL (ref 3.5–5.0)
Alkaline Phosphatase: 103 U/L (ref 38–126)
Anion gap: 10 (ref 5–15)
BILIRUBIN TOTAL: 0.5 mg/dL (ref 0.3–1.2)
BUN: 13 mg/dL (ref 8–23)
CO2: 22 mmol/L (ref 22–32)
Calcium: 8.8 mg/dL — ABNORMAL LOW (ref 8.9–10.3)
Chloride: 104 mmol/L (ref 98–111)
Creatinine, Ser: 1.38 mg/dL — ABNORMAL HIGH (ref 0.61–1.24)
GFR, EST AFRICAN AMERICAN: 55 mL/min — AB (ref >60)
GFR, EST NON AFRICAN AMERICAN: 47 mL/min — AB (ref >60)
Glucose, Bld: 215 mg/dL — ABNORMAL HIGH (ref 70–99)
Potassium: 4.3 mmol/L (ref 3.5–5.1)
Sodium: 136 mmol/L (ref 135–145)
TOTAL PROTEIN: 7.3 g/dL (ref 6.5–8.1)

## 2018-09-21 LAB — PROTEIN, URINE, RANDOM: TOTAL PROTEIN, URINE: 23 mg/dL

## 2018-09-21 MED ORDER — SODIUM CHLORIDE 0.9 % IV SOLN
Freq: Once | INTRAVENOUS | Status: AC
  Administered 2018-09-21: 10:00:00 via INTRAVENOUS
  Filled 2018-09-21: qty 250

## 2018-09-21 MED ORDER — SODIUM CHLORIDE 0.9 % IV SOLN
2400.0000 mg/m2 | INTRAVENOUS | Status: DC
  Administered 2018-09-21: 4850 mg via INTRAVENOUS
  Filled 2018-09-21: qty 97

## 2018-09-21 MED ORDER — PALONOSETRON HCL INJECTION 0.25 MG/5ML
0.2500 mg | Freq: Once | INTRAVENOUS | Status: AC
  Administered 2018-09-21: 0.25 mg via INTRAVENOUS
  Filled 2018-09-21: qty 5

## 2018-09-21 MED ORDER — SODIUM CHLORIDE 0.9% FLUSH
10.0000 mL | Freq: Once | INTRAVENOUS | Status: AC
  Administered 2018-09-21: 10 mL via INTRAVENOUS
  Filled 2018-09-21: qty 10

## 2018-09-21 MED ORDER — HEPARIN SOD (PORK) LOCK FLUSH 100 UNIT/ML IV SOLN: 500.0000 [IU] | Freq: Once | INTRAVENOUS | Status: DC

## 2018-09-21 MED ORDER — DEXTROSE 5 % IV SOLN
Freq: Once | INTRAVENOUS | Status: AC
  Administered 2018-09-21: 11:00:00 via INTRAVENOUS
  Filled 2018-09-21: qty 250

## 2018-09-21 MED ORDER — OXALIPLATIN CHEMO INJECTION 100 MG/20ML
65.0000 mg/m2 | Freq: Once | INTRAVENOUS | Status: AC
  Administered 2018-09-21: 130 mg via INTRAVENOUS
  Filled 2018-09-21: qty 20

## 2018-09-21 MED ORDER — LEUCOVORIN CALCIUM INJECTION 350 MG
800.0000 mg | Freq: Once | INTRAVENOUS | Status: AC
  Administered 2018-09-21: 800 mg via INTRAVENOUS
  Filled 2018-09-21: qty 25

## 2018-09-21 MED ORDER — FLUOROURACIL CHEMO INJECTION 2.5 GM/50ML
400.0000 mg/m2 | Freq: Once | INTRAVENOUS | Status: AC
  Administered 2018-09-21: 800 mg via INTRAVENOUS
  Filled 2018-09-21: qty 16

## 2018-09-21 MED ORDER — SODIUM CHLORIDE 0.9 % IV SOLN
INTRAVENOUS | Status: DC
  Administered 2018-09-21: 10:00:00 via INTRAVENOUS
  Filled 2018-09-21: qty 250

## 2018-09-21 MED ORDER — DEXAMETHASONE SODIUM PHOSPHATE 10 MG/ML IJ SOLN
10.0000 mg | Freq: Once | INTRAMUSCULAR | Status: AC
  Administered 2018-09-21: 10 mg via INTRAVENOUS
  Filled 2018-09-21: qty 1

## 2018-09-21 MED ORDER — SODIUM CHLORIDE 0.9 % IV SOLN
5.0000 mg/kg | Freq: Once | INTRAVENOUS | Status: AC
  Administered 2018-09-21: 450 mg via INTRAVENOUS
  Filled 2018-09-21: qty 18

## 2018-09-21 NOTE — Progress Notes (Signed)
No new changes noted today 

## 2018-09-21 NOTE — Progress Notes (Signed)
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  Discussed the results of CT scans  Heme/Onc history: 1. Patient is a 77 year old male who presented with evidence of bowel obstruction on 02/06/2017. At that time he had progressive abdominal distention and no bowel movement for about one week. CT abdomen showed an apple core lesion in the descending/sigmoid colon.  2. Patient underwent Hartmann's procedure for obstructing sigmoid colon mass on 02/06/2017. Preoperative CEA was 5.0  3. 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 It is 0 out of 15 lymph nodes were positive for malignancy. Perineural and lymphovascular invasion was present. Margins were negative.pT3N0. MMR stable.  4. Given that he had high risk stage II colon cancer including he presented with obstruction and has LVI and perineural invasion- adjuvant xeloda chemotherapy was recommended for 6 months.   5.Patient had evidence of hand foot syndrome after cycle 3. Cycle 4 delayed by 1 week. Topical urea cream prescribed  6. Patient completed 8 cycles of adjuvant xeloda in October 2018. polps seen in sigmoid and decending colon but negative for malignancy  7. Repeat Ct abdomen after 8 cycles showed no metastatic disease. 6 mm lung nodule noted in RLL  8.Patient had a repeat colonoscopy in October 2018 which showed polyps in his transverse and descending colon which were  taken out and were negative for high-grade dysplasia or malignancy. Patient subsequently underwent colostomy takedown procedure by Dr. Burt Knack  9. Repeat CT chest abdomen/ pelvis showed increase in the size of previously seen lung nodules largest being 1.4 cm consistent with metastatic disease. PET/CT showed mild uptake in 2 lung nodules. Others were not hypermetabolic. No other evidence of metastatic disease  10. Repeat lung biopsy consistent with colon adenocarcinoma. Comprehensive RAS panel testing showed mutant KRAS  11.Given the slow rate of growth of his colon cancer and desire to maintain his quality of life plan was to start chemotherapy after he gets a 82-monthbreak in summer. Repeat CT chest abdomen and pelvis in August 2019 showed mild increase in the size of his lung nodules. No new lung nodules are no new sites of distant metastatic disease.FOLFOX Avastin chemotherapy started on 06/22/2018   Interval history-doing well and tolerating chemo well so far.  Denies any nausea and vomiting.  Denies any tingling numbness in his extremities  ECOG PS- 0 Pain scale- 0 Opioid associated constipation- no  Review of systems- Review of Systems  Constitutional: Negative for chills, fever, malaise/fatigue and weight loss.  HENT: Negative for congestion, ear discharge and nosebleeds.   Eyes: Negative for blurred vision.  Respiratory: Negative for cough, hemoptysis, sputum production, shortness of breath and wheezing.   Cardiovascular: Negative for chest pain, palpitations, orthopnea and claudication.  Gastrointestinal: Negative for abdominal pain, blood in stool, constipation, diarrhea, heartburn, melena, nausea and vomiting.  Genitourinary: Negative for dysuria, flank pain, frequency, hematuria and urgency.  Musculoskeletal: Negative for back pain, joint pain and myalgias.  Skin: Negative for rash.  Neurological: Negative for dizziness, tingling, focal weakness, seizures, weakness  and  headaches.  Endo/Heme/Allergies: Does not bruise/bleed easily.  Psychiatric/Behavioral: Negative for depression and suicidal ideas. The patient does not have insomnia.       No Known Allergies   Past Medical History:  Diagnosis Date  . Cancer ([place])    skin cancer-nose w graft,also shoulder- basal cell per pt  . Cataract    r eye  . Chronic kidney disease    kidney stones 20 y ago per pt  . Colon cancer ([place])   . Diabetes mellitus without complication ([place])   . Eczema   . History of kidney stones   . Hyperlipidemia   . Hypertension   . Vertigo      Past Surgical History:  Procedure Laterality Date  . CATARACT EXTRACTION Left   . COLECTOMY WITH COLOSTOMY CREATION/HARTMANN PROCEDURE N/A 02/06/2017   Procedure: COLECTOMY WITH COLOSTOMY CREATION/HARTMANN PROCEDURE;  Surgeon: [name], MD;  Location: [facility] ORS;  Service: General;  Laterality: N/A;  . COLON SURGERY    . COLONOSCOPY WITH PROPOFOL N/A 09/04/2017   Procedure: COLONOSCOPY WITH PROPOFOL;  Surgeon: Jonathon Bellows, MD;  Location: [facility] ENDOSCOPY;  Service: Gastroenterology;  Laterality: N/A;  . COLOSTOMY CLOSURE N/A 10/20/2017   Procedure: COLOSTOMY CLOSURE;  Surgeon: [name], MD;  Location: [facility] ORS;  Service: General;  Laterality: N/A;  . CYSTOSCOPY    . FLEXIBLE SIGMOIDOSCOPY N/A 02/06/2017   Procedure: FLEXIBLE SIGMOIDOSCOPY;  Surgeon: Christene Lye, MD;  Location: [facility] ENDOSCOPY;  Service: Endoscopy;  Laterality: N/A;  . PORTACATH PLACEMENT N/A 06/17/2018   Procedure: INSERTION PORT-A-CATH, WITH FLUOROSCOPY;  Surgeon: [name], MD;  Location: [facility] ORS;  Service: General;  Laterality: N/A;    Social History   Socioeconomic History  . Marital status: Married    Spouse name: Not on file  . Number of children: Not on file  . Years of education: Not on file  . Highest education level: Not on file  Occupational History  . Not on file  Social Needs  . Financial resource strain: Not on  file  . Food insecurity:    Worry: Not on file    Inability: Not on file  . Transportation needs:    Medical: Not on file    Non-medical: Not on file  Tobacco Use  . Smoking status: Never Smoker  . Smokeless tobacco: Never Used  Substance and Sexual Activity  . Alcohol use: No  . Drug use: No  . Sexual activity: Not Currently  Lifestyle  . Physical activity:    Days per week: Not on file    Minutes per session: Not on file  . Stress: Not on file  Relationships  . Social connections:    Talks on phone: Not on file    Gets together: Not on file    Attends religious service: Not on file    Active member of club or organization: Not on file    Attends meetings of clubs or organizations: Not on file    Relationship status: Not on file  . Intimate partner violence:    Fear of current or ex partner: Not on file    Emotionally abused: Not on file    Physically abused: Not on file    Forced sexual activity: Not on file  Other Topics Concern  . Not on file  Social History Narrative  . Not on file    Family History  Problem Relation Age of Onset  . Diabetes Mother   . AAA (abdominal  aortic aneurysm) Mother   . Coronary artery disease Mother   . Diabetes Father   . Coronary artery disease Father   . Dementia Father   . Breast cancer Sister      Current Outpatient Medications:  .  acetaminophen (TYLENOL) 500 MG tablet, Take 500 mg by mouth every 6 (six) hours as needed (for pain.)., Disp: , Rfl:  .  aspirin EC 81 MG tablet, Take 81 mg by mouth daily., Disp: , Rfl:  .  glimepiride (AMARYL) 2 MG tablet, Take 8 mg by mouth daily with supper. , Disp: , Rfl:  .  glimepiride (AMARYL) 4 MG tablet, Take 4 mg by mouth 2 (two) times daily., Disp: , Rfl:  .  HYDROcodone-Acetaminophen (VICODIN) 5-300 MG TABS, Take 1 tablet by mouth every 4 (four) hours as needed. (Patient not taking: Reported on 07/06/2018), Disp: 15 each, Rfl: 0 .  lidocaine-prilocaine (EMLA) cream, Place a small  amount over cream over port 1 hour before each treatment, cover cream with saran wrap for clothing protection (Patient not taking: Reported on 08/10/2018), Disp: 30 g, Rfl: 1 .  loratadine (CLARITIN) 10 MG tablet, Take 10 mg by mouth daily., Disp: , Rfl:  .  losartan (COZAAR) 25 MG tablet, Take 25 mg by mouth daily., Disp: , Rfl:  .  metFORMIN (GLUCOPHAGE) 1000 MG tablet, Take 1,000-1,500 tablets by mouth See admin instructions. TAKE 1 TABLET (1000 MG) BY MOUTH IN THE MORNING & TAKE 1.5 TABLETS (1500 MG) BY MOUTH AT SUPPER, Disp: , Rfl:  .  ondansetron (ZOFRAN) 8 MG tablet, Take 1 tablet (8 mg total) by mouth 2 (two) times daily as needed for refractory nausea / vomiting. Start on day 3 after chemotherapy. (Patient not taking: Reported on 08/10/2018), Disp: 30 tablet, Rfl: 1 .  pravastatin (PRAVACHOL) 20 MG tablet, Take 20 mg by mouth daily with supper. , Disp: , Rfl:  .  prochlorperazine (COMPAZINE) 10 MG tablet, Take 1 tablet (10 mg total) by mouth every 6 (six) hours as needed (Nausea or vomiting). (Patient not taking: Reported on 08/10/2018), Disp: 30 tablet, Rfl: 1 .  triamcinolone (KENALOG) 0.025 % cream, Apply 1 application topically daily as needed (FOR EZCEMA). , Disp: , Rfl:  No current facility-administered medications for this visit.   Facility-Administered Medications Ordered in Other Visits:  .  0.9 %  sodium chloride infusion, , Intravenous, Continuous, Sindy Guadeloupe, MD, Stopped at 07/27/18 1037 .  heparin lock flush 100 unit/mL, 500 Units, Intravenous, Once, Sindy Guadeloupe, MD .  heparin lock flush 100 unit/mL, 500 Units, Intravenous, Once, Sindy Guadeloupe, MD .  sodium chloride flush (NS) 0.9 % injection 10 mL, 10 mL, Intravenous, PRN, Sindy Guadeloupe, MD, 10 mL at 07/27/18 0920 .  sodium chloride flush (NS) 0.9 % injection 10 mL, 10 mL, Intravenous, Once, Sindy Guadeloupe, MD  Physical exam:  Vitals:   09/21/18 0843  BP: (!) 152/72  Pulse: 73  Resp: 18  Temp: 97.9 F (36.6 C)    TempSrc: Tympanic  SpO2: 96%  Weight: 186 lb (84.4 kg)  Height: 5' 6" (1.676 m)   Physical Exam  Constitutional: He is oriented to person, place, and time. He appears well-developed and well-nourished.  HENT:  Head: Normocephalic and atraumatic.  Eyes: Pupils are equal, round, and reactive to light. EOM are normal.  Neck: Normal range of motion.  Cardiovascular: Normal rate, regular rhythm and normal heart sounds.  Pulmonary/Chest: Effort normal and breath sounds normal.  Abdominal:  Soft. Bowel sounds are normal.  Neurological: He is alert and oriented to person, place, and time.  Skin: Skin is warm and dry.     CMP Latest Ref Rng & Units 09/21/2018  Glucose 70 - 99 mg/dL 215(H)  BUN 8 - 23 mg/dL 13  Creatinine 0.61 - 1.24 mg/dL 1.38(H)  Sodium 135 - 145 mmol/L 136  Potassium 3.5 - 5.1 mmol/L 4.3  Chloride 98 - 111 mmol/L 104  CO2 22 - 32 mmol/L 22  Calcium 8.9 - 10.3 mg/dL 8.8(L)  Total Protein 6.5 - 8.1 g/dL 7.3  Total Bilirubin 0.3 - 1.2 mg/dL 0.5  Alkaline Phos 38 - 126 U/L 103  AST 15 - 41 U/L 25  ALT 0 - 44 U/L 17   CBC Latest Ref Rng & Units 09/21/2018  WBC 4.0 - 10.5 K/uL 11.4(H)  Hemoglobin 13.0 - 17.0 g/dL 12.5(L)  Hematocrit 39.0 - 52.0 % 39.6  Platelets 150 - 400 K/uL 149(L)    No images are attached to the encounter.  Ct Chest W Contrast  Result Date: 09/17/2018 CLINICAL DATA:  Colon cancer diagnosed in 02/06/2017 post surgery, chemotherapy and immunotherapy. Subsequent progressive pulmonary metastatic disease. EXAM: CT CHEST, ABDOMEN, AND PELVIS WITH CONTRAST TECHNIQUE: Multidetector CT imaging of the chest, abdomen and pelvis was performed following the standard protocol during bolus administration of intravenous contrast. CONTRAST:  120m ISOVUE-300 IOPAMIDOL (ISOVUE-300) INJECTION 61% COMPARISON:  CT 06/18/2018 and PET-CT 03/19/2018. FINDINGS: CT CHEST FINDINGS Cardiovascular: Prominent coronary artery atherosclerosis is again noted with lesser  atherosclerosis of the aorta and great vessels. Left subclavian Port-A-Cath extends to the superior cavoatrial junction. No acute vascular findings are demonstrated. The heart size is normal. There is no pericardial effusion. Mediastinum/Nodes: There are no enlarged mediastinal, hilar or axillary lymph nodes. Small mediastinal lymph nodes are stable. The thyroid gland, trachea and esophagus appear unremarkable. Lungs/Pleura: There is no pleural effusion. The previously demonstrated bilateral pulmonary nodules have decreased in size. The largest nodule on the right is in the lower lobe, measuring 9 x 7 mm on image 107/3 (previously 11 mm). This is now slightly cavitary. 7 mm left upper lobe nodule on image 61/3 previously measured 9 mm. There are lingular nodules measuring 12 x 11 mm on image 74/3 (previously 15 mm) and 9 mm on image 86/3 (previously 14 mm). No new or enlarging nodules. Musculoskeletal/Chest wall: No chest wall mass or suspicious osseous findings. Stable mild bilateral gynecomastia. There are stable scattered lucencies and paraspinal osteophytes within the thoracic spine. CT ABDOMEN AND PELVIS FINDINGS Hepatobiliary: The liver is normal in density without suspicious focal abnormality. Tiny lesion in the dome of the right hepatic lobe on image 49/2 is stable, likely a cyst. Calcified gallstones are noted. There is no gallbladder wall thickening or biliary dilatation. Pancreas: Unremarkable. No pancreatic ductal dilatation or surrounding inflammatory changes. Spleen: Normal in size without focal abnormality. Adrenals/Urinary Tract: Both adrenal glands appear normal. There is a stable punctate calculus in the upper pole of the right kidney. No evidence of ureteral calculus, hydronephrosis or renal mass. The bladder appears normal. Stomach/Bowel: No evidence of bowel wall thickening, distention or surrounding inflammatory change. The appendix appears normal. There are stable mild diverticular changes in  the distal colon. Sigmoid anastomosis appears unchanged. Vascular/Lymphatic: There are no enlarged abdominal or pelvic lymph nodes. Mild aortic and branch vessel atherosclerosis. Reproductive: Dystrophic calcifications in the central prostate gland again noted. The seminal vesicles appear normal. Other: Postsurgical changes in the anterior abdominal wall. No  ascites or peritoneal nodularity. Musculoskeletal: No acute or significant osseous findings. Stable small lucencies in the lumbar spine. IMPRESSION: 1. Interval decreased size of bilateral pulmonary nodules consistent with treated metastatic disease. No new or enlarging nodules or thoracic adenopathy identified. 2. No evidence metastatic disease in the abdomen or pelvis. 3. Stable incidental findings including cholelithiasis, distal colonic diverticulosis, coronary and Aortic Atherosclerosis (ICD10-I70.0). Electronically Signed   By: Richardean Sale M.D.   On: 09/17/2018 16:40   Ct Abdomen Pelvis W Contrast  Result Date: 09/17/2018 CLINICAL DATA:  Colon cancer diagnosed in 02/06/2017 post surgery, chemotherapy and immunotherapy. Subsequent progressive pulmonary metastatic disease. EXAM: CT CHEST, ABDOMEN, AND PELVIS WITH CONTRAST TECHNIQUE: Multidetector CT imaging of the chest, abdomen and pelvis was performed following the standard protocol during bolus administration of intravenous contrast. CONTRAST:  180m ISOVUE-300 IOPAMIDOL (ISOVUE-300) INJECTION 61% COMPARISON:  CT 06/18/2018 and PET-CT 03/19/2018. FINDINGS: CT CHEST FINDINGS Cardiovascular: Prominent coronary artery atherosclerosis is again noted with lesser atherosclerosis of the aorta and great vessels. Left subclavian Port-A-Cath extends to the superior cavoatrial junction. No acute vascular findings are demonstrated. The heart size is normal. There is no pericardial effusion. Mediastinum/Nodes: There are no enlarged mediastinal, hilar or axillary lymph nodes. Small mediastinal lymph nodes are  stable. The thyroid gland, trachea and esophagus appear unremarkable. Lungs/Pleura: There is no pleural effusion. The previously demonstrated bilateral pulmonary nodules have decreased in size. The largest nodule on the right is in the lower lobe, measuring 9 x 7 mm on image 107/3 (previously 11 mm). This is now slightly cavitary. 7 mm left upper lobe nodule on image 61/3 previously measured 9 mm. There are lingular nodules measuring 12 x 11 mm on image 74/3 (previously 15 mm) and 9 mm on image 86/3 (previously 14 mm). No new or enlarging nodules. Musculoskeletal/Chest wall: No chest wall mass or suspicious osseous findings. Stable mild bilateral gynecomastia. There are stable scattered lucencies and paraspinal osteophytes within the thoracic spine. CT ABDOMEN AND PELVIS FINDINGS Hepatobiliary: The liver is normal in density without suspicious focal abnormality. Tiny lesion in the dome of the right hepatic lobe on image 49/2 is stable, likely a cyst. Calcified gallstones are noted. There is no gallbladder wall thickening or biliary dilatation. Pancreas: Unremarkable. No pancreatic ductal dilatation or surrounding inflammatory changes. Spleen: Normal in size without focal abnormality. Adrenals/Urinary Tract: Both adrenal glands appear normal. There is a stable punctate calculus in the upper pole of the right kidney. No evidence of ureteral calculus, hydronephrosis or renal mass. The bladder appears normal. Stomach/Bowel: No evidence of bowel wall thickening, distention or surrounding inflammatory change. The appendix appears normal. There are stable mild diverticular changes in the distal colon. Sigmoid anastomosis appears unchanged. Vascular/Lymphatic: There are no enlarged abdominal or pelvic lymph nodes. Mild aortic and branch vessel atherosclerosis. Reproductive: Dystrophic calcifications in the central prostate gland again noted. The seminal vesicles appear normal. Other: Postsurgical changes in the anterior  abdominal wall. No ascites or peritoneal nodularity. Musculoskeletal: No acute or significant osseous findings. Stable small lucencies in the lumbar spine. IMPRESSION: 1. Interval decreased size of bilateral pulmonary nodules consistent with treated metastatic disease. No new or enlarging nodules or thoracic adenopathy identified. 2. No evidence metastatic disease in the abdomen or pelvis. 3. Stable incidental findings including cholelithiasis, distal colonic diverticulosis, coronary and Aortic Atherosclerosis (ICD10-I70.0). Electronically Signed   By: WRichardean SaleM.D.   On: 09/17/2018 16:40     Assessment and plan- Patient is a 77y.o. male with stage IV  colon adenocarcinoma with lung metastases. He is due for on treatment assessment prior to cycle 7 of FOLFOX Avastin chemotherapy  I have reviewed CT chest abdomen and pelvis images independently and discussed findings with the patient.  He has responded to FOLFOX Avastin treatment so far and his lung nodules have all decreased in size.  There are no new enlarging nodules.  No evidence of metastatic disease outside the chest.  Plan is to continue palliative FOLFOX and Avastin at this time every 2 weeks until progression or toxicity.  Patient does say that coming back for his 5-FU pump disconnect may become a quality of life issue for him down the line and at that point we may use consider switching him to Xeloda which he has tolerated well in the past.  I will see him back in 2 weeks time with CBC and CMP prior to cycle 8 of FOLFOX Avastin chemotherapy.  Urine shows trace protein and blood pressure readings especially at home have been stable and less than systolic blood pressure of 140.  Okay therefore to proceed with a Avastin.  Creatinine is mildly elevated at 1.38.  We will give him 1 L of IV fluids today and I have asked him to optimize his oral hydration over the next few days     Total face to face encounter time for this patient visit was  40 min. >50% of the time was  spent in counseling and coordination of care. Discussing further treatment options down the line  Visit Diagnosis 1. Encounter for antineoplastic chemotherapy   2. 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[employer])   3. Malignant neoplasm metastatic to lung, unspecified laterality ([place])   4. AKI (acute kidney injury) ([place])      Dr. Randa Evens, MD, MPH [facility] [phone] 09/21/2018 10:44 AM

## 2018-09-23 ENCOUNTER — Inpatient Hospital Stay: Payer: Medicare Other

## 2018-09-23 VITALS — BP 146/75 | HR 78 | Temp 96.8°F | Resp 20

## 2018-09-23 DIAGNOSIS — C7801 Secondary malignant neoplasm of right lung: Secondary | ICD-10-CM | POA: Diagnosis not present

## 2018-09-23 DIAGNOSIS — C7800 Secondary malignant neoplasm of unspecified lung: Secondary | ICD-10-CM

## 2018-09-23 DIAGNOSIS — C189 Malignant neoplasm of colon, unspecified: Secondary | ICD-10-CM

## 2018-09-23 MED ORDER — HEPARIN SOD (PORK) LOCK FLUSH 100 UNIT/ML IV SOLN
500.0000 [IU] | Freq: Once | INTRAVENOUS | Status: AC | PRN
  Administered 2018-09-23: 500 [IU]
  Filled 2018-09-23: qty 5

## 2018-09-23 MED ORDER — PEGFILGRASTIM-CBQV 6 MG/0.6ML ~~LOC~~ SOSY
6.0000 mg | PREFILLED_SYRINGE | Freq: Once | SUBCUTANEOUS | Status: AC
  Administered 2018-09-23: 6 mg via SUBCUTANEOUS
  Filled 2018-09-23: qty 0.6

## 2018-09-23 MED ORDER — SODIUM CHLORIDE 0.9% FLUSH
10.0000 mL | INTRAVENOUS | Status: DC | PRN
  Administered 2018-09-23: 10 mL
  Filled 2018-09-23: qty 10

## 2018-10-05 ENCOUNTER — Other Ambulatory Visit: Payer: Medicare Other

## 2018-10-05 ENCOUNTER — Ambulatory Visit: Payer: Medicare Other

## 2018-10-05 ENCOUNTER — Ambulatory Visit: Payer: Medicare Other | Admitting: Oncology

## 2018-10-12 ENCOUNTER — Inpatient Hospital Stay (HOSPITAL_BASED_OUTPATIENT_CLINIC_OR_DEPARTMENT_OTHER): Payer: Medicare Other | Admitting: Oncology

## 2018-10-12 ENCOUNTER — Inpatient Hospital Stay: Payer: Medicare Other | Attending: Oncology

## 2018-10-12 ENCOUNTER — Inpatient Hospital Stay: Payer: Medicare Other

## 2018-10-12 ENCOUNTER — Encounter: Payer: Self-pay | Admitting: Oncology

## 2018-10-12 ENCOUNTER — Other Ambulatory Visit: Payer: Self-pay

## 2018-10-12 VITALS — BP 127/77 | HR 81 | Temp 95.5°F | Resp 18 | Wt 187.1 lb

## 2018-10-12 VITALS — BP 124/72 | HR 73

## 2018-10-12 DIAGNOSIS — I10 Essential (primary) hypertension: Secondary | ICD-10-CM | POA: Diagnosis not present

## 2018-10-12 DIAGNOSIS — Z5189 Encounter for other specified aftercare: Secondary | ICD-10-CM | POA: Diagnosis not present

## 2018-10-12 DIAGNOSIS — C189 Malignant neoplasm of colon, unspecified: Secondary | ICD-10-CM

## 2018-10-12 DIAGNOSIS — C7801 Secondary malignant neoplasm of right lung: Secondary | ICD-10-CM | POA: Insufficient documentation

## 2018-10-12 DIAGNOSIS — C187 Malignant neoplasm of sigmoid colon: Secondary | ICD-10-CM | POA: Insufficient documentation

## 2018-10-12 DIAGNOSIS — Z5111 Encounter for antineoplastic chemotherapy: Secondary | ICD-10-CM | POA: Insufficient documentation

## 2018-10-12 DIAGNOSIS — C7800 Secondary malignant neoplasm of unspecified lung: Secondary | ICD-10-CM

## 2018-10-12 DIAGNOSIS — C7802 Secondary malignant neoplasm of left lung: Secondary | ICD-10-CM | POA: Diagnosis not present

## 2018-10-12 DIAGNOSIS — Z5112 Encounter for antineoplastic immunotherapy: Secondary | ICD-10-CM | POA: Diagnosis present

## 2018-10-12 DIAGNOSIS — Z803 Family history of malignant neoplasm of breast: Secondary | ICD-10-CM

## 2018-10-12 LAB — CBC WITH DIFFERENTIAL/PLATELET
Abs Immature Granulocytes: 0.03 10*3/uL (ref 0.00–0.07)
BASOS ABS: 0.1 10*3/uL (ref 0.0–0.1)
Basophils Relative: 1 %
EOS PCT: 2 %
Eosinophils Absolute: 0.1 10*3/uL (ref 0.0–0.5)
HEMATOCRIT: 39.1 % (ref 39.0–52.0)
Hemoglobin: 12.1 g/dL — ABNORMAL LOW (ref 13.0–17.0)
Immature Granulocytes: 0 %
LYMPHS ABS: 2.1 10*3/uL (ref 0.7–4.0)
LYMPHS PCT: 27 %
MCH: 29 pg (ref 26.0–34.0)
MCHC: 30.9 g/dL (ref 30.0–36.0)
MCV: 93.8 fL (ref 80.0–100.0)
Monocytes Absolute: 0.7 10*3/uL (ref 0.1–1.0)
Monocytes Relative: 9 %
NRBC: 0 % (ref 0.0–0.2)
Neutro Abs: 4.8 10*3/uL (ref 1.7–7.7)
Neutrophils Relative %: 61 %
Platelets: 218 10*3/uL (ref 150–400)
RBC: 4.17 MIL/uL — ABNORMAL LOW (ref 4.22–5.81)
RDW: 16.3 % — ABNORMAL HIGH (ref 11.5–15.5)
WBC: 7.8 10*3/uL (ref 4.0–10.5)

## 2018-10-12 LAB — COMPREHENSIVE METABOLIC PANEL
ALT: 16 U/L (ref 0–44)
AST: 26 U/L (ref 15–41)
Albumin: 4.2 g/dL (ref 3.5–5.0)
Alkaline Phosphatase: 72 U/L (ref 38–126)
Anion gap: 12 (ref 5–15)
BUN: 19 mg/dL (ref 8–23)
CHLORIDE: 103 mmol/L (ref 98–111)
CO2: 22 mmol/L (ref 22–32)
CREATININE: 1.24 mg/dL (ref 0.61–1.24)
Calcium: 9.6 mg/dL (ref 8.9–10.3)
GFR calc non Af Amer: 55 mL/min — ABNORMAL LOW (ref >60)
Glucose, Bld: 163 mg/dL — ABNORMAL HIGH (ref 70–99)
Potassium: 4.3 mmol/L (ref 3.5–5.1)
SODIUM: 137 mmol/L (ref 135–145)
Total Bilirubin: 0.6 mg/dL (ref 0.3–1.2)
Total Protein: 7.3 g/dL (ref 6.5–8.1)

## 2018-10-12 LAB — PROTEIN, URINE, RANDOM: Total Protein, Urine: 8 mg/dL

## 2018-10-12 MED ORDER — SODIUM CHLORIDE 0.9 % IV SOLN
Freq: Once | INTRAVENOUS | Status: AC
  Administered 2018-10-12: 11:00:00 via INTRAVENOUS
  Filled 2018-10-12: qty 250

## 2018-10-12 MED ORDER — SODIUM CHLORIDE 0.9 % IV SOLN
5.0000 mg/kg | Freq: Once | INTRAVENOUS | Status: AC
  Administered 2018-10-12: 450 mg via INTRAVENOUS
  Filled 2018-10-12: qty 18

## 2018-10-12 MED ORDER — PALONOSETRON HCL INJECTION 0.25 MG/5ML
0.2500 mg | Freq: Once | INTRAVENOUS | Status: AC
  Administered 2018-10-12: 0.25 mg via INTRAVENOUS
  Filled 2018-10-12: qty 5

## 2018-10-12 MED ORDER — HEPARIN SOD (PORK) LOCK FLUSH 100 UNIT/ML IV SOLN: 500.0000 [IU] | Freq: Once | INTRAVENOUS | Status: DC

## 2018-10-12 MED ORDER — FLUOROURACIL CHEMO INJECTION 2.5 GM/50ML
400.0000 mg/m2 | Freq: Once | INTRAVENOUS | Status: AC
  Administered 2018-10-12: 800 mg via INTRAVENOUS
  Filled 2018-10-12: qty 16

## 2018-10-12 MED ORDER — SODIUM CHLORIDE 0.9 % IV SOLN
2400.0000 mg/m2 | INTRAVENOUS | Status: DC
  Administered 2018-10-12: 4850 mg via INTRAVENOUS
  Filled 2018-10-12: qty 97

## 2018-10-12 MED ORDER — SODIUM CHLORIDE 0.9% FLUSH
10.0000 mL | INTRAVENOUS | Status: DC | PRN
  Administered 2018-10-12: 10 mL via INTRAVENOUS
  Filled 2018-10-12: qty 10

## 2018-10-12 MED ORDER — LEUCOVORIN CALCIUM INJECTION 350 MG
800.0000 mg | Freq: Once | INTRAVENOUS | Status: AC
  Administered 2018-10-12: 800 mg via INTRAVENOUS
  Filled 2018-10-12: qty 25

## 2018-10-12 MED ORDER — OXALIPLATIN CHEMO INJECTION 100 MG/20ML
65.0000 mg/m2 | Freq: Once | INTRAVENOUS | Status: AC
  Administered 2018-10-12: 130 mg via INTRAVENOUS
  Filled 2018-10-12: qty 26

## 2018-10-12 MED ORDER — DEXAMETHASONE SODIUM PHOSPHATE 10 MG/ML IJ SOLN
10.0000 mg | Freq: Once | INTRAMUSCULAR | Status: AC
  Administered 2018-10-12: 10 mg via INTRAVENOUS
  Filled 2018-10-12: qty 1

## 2018-10-12 MED ORDER — DEXTROSE 5 % IV SOLN
Freq: Once | INTRAVENOUS | Status: AC
  Administered 2018-10-12: 12:00:00 via INTRAVENOUS
  Filled 2018-10-12: qty 250

## 2018-10-12 NOTE — Progress Notes (Signed)
Here for follow up. 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 Per pt having a " runny nose and voice hoarsness " he stated no other voiced / noted c/o

## 2018-10-12 NOTE — Progress Notes (Signed)
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 Patient is a 77 year old male who presented with evidence of bowel obstruction on 02/06/2017. At that time he had progressive abdominal distention and no bowel movement for about one week. CT abdomen showed an apple core lesion in the descending/sigmoid colon.  2. Patient underwent Hartmann's procedure for obstructing sigmoid colon mass on 02/06/2017. Preoperative CEA was 5.0  3. 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 It is 0 out of 15 lymph nodes were positive for malignancy. Perineural and lymphovascular invasion was present. Margins were negative.pT3N0. MMR stable.  4. Given that he had high risk stage II colon cancer including he presented with obstruction and has LVI and perineural invasion- adjuvant xeloda chemotherapy was recommended for 6 months.   5.Patient had evidence of hand foot syndrome after cycle 3. Cycle 4 delayed by 1 week. Topical urea cream prescribed  6. Patient completed 8 cycles of adjuvant xeloda in October 2018. polps seen in sigmoid and decending colon but negative for malignancy  7. Repeat Ct abdomen after 8 cycles showed no metastatic disease. 6 mm lung nodule noted in RLL  8.Patient had a repeat colonoscopy in October 2018 which showed polyps in his transverse and descending colon which were taken out and were negative for  high-grade dysplasia or malignancy. Patient subsequently underwent colostomy takedown procedure by Dr. Burt Knack  9. Repeat CT chest abdomen/ pelvis showed increase in the size of previously seen lung nodules largest being 1.4 cm consistent with metastatic disease. PET/CT showed mild uptake in 2 lung nodules. Others were not hypermetabolic. No other evidence of metastatic disease  10. Repeat lung biopsy consistent with colon adenocarcinoma. Comprehensive RAS panel testing showed mutant KRAS  11.Given the slow rate of growth of his colon cancer and desire to maintain his quality of life plan was to start chemotherapy after he gets a 36-monthbreak in summer. Repeat CT chest abdomen and pelvis in August 2019 showed mild increase in the size of his lung nodules. No new lung nodules are no new sites of distant metastatic disease.FOLFOX Avastin chemotherapy started on 06/22/2018   Interval history-he feels well and denies any complaints today.  Denies any nausea vomiting or fatigue.  Denies any peripheral neuropathy.  His bowel movements are regular  ECOG PS- 0 Pain scale- 0 Opioid associated constipation- no  Review of systems- Review of Systems  Constitutional: Negative for chills, fever, malaise/fatigue and weight loss.  HENT: Negative for congestion, ear discharge and nosebleeds.   Eyes: Negative for blurred vision.  Respiratory: Negative for cough, hemoptysis, sputum production, shortness of breath and wheezing.   Cardiovascular: Negative for chest pain, palpitations, orthopnea and claudication.  Gastrointestinal: Negative for abdominal pain, blood in stool, constipation, diarrhea, heartburn, melena, nausea and vomiting.  Genitourinary: Negative for dysuria, flank pain, frequency, hematuria and urgency.  Musculoskeletal: Negative for back pain, joint pain and myalgias.  Skin: Negative for rash.  Neurological: Negative for dizziness, tingling, focal weakness, seizures, weakness and  headaches.  Endo/Heme/Allergies: Does not bruise/bleed easily.  Psychiatric/Behavioral: Negative for depression and suicidal ideas. The patient does not have insomnia.       No Known Allergies   Past Medical History:  Diagnosis Date  . Cancer ([place])    skin cancer-nose w graft,also shoulder- basal cell per pt  . Cataract    r eye  . Chronic kidney disease    kidney stones 20 y ago per pt  . Colon cancer ([place])   . Diabetes mellitus without complication ([place])   . Eczema   . History of kidney stones   . Hyperlipidemia   . Hypertension   . Vertigo      Past Surgical History:  Procedure Laterality Date  . CATARACT EXTRACTION Left   . COLECTOMY WITH COLOSTOMY CREATION/HARTMANN PROCEDURE N/A 02/06/2017   Procedure: COLECTOMY WITH COLOSTOMY CREATION/HARTMANN PROCEDURE;  Surgeon: [name], MD;  Location: [facility] ORS;  Service: General;  Laterality: N/A;  . COLON SURGERY    . COLONOSCOPY WITH PROPOFOL N/A 09/04/2017   Procedure: COLONOSCOPY WITH PROPOFOL;  Surgeon: Jonathon Bellows, MD;  Location: [facility] ENDOSCOPY;  Service: Gastroenterology;  Laterality: N/A;  . COLOSTOMY CLOSURE N/A 10/20/2017   Procedure: COLOSTOMY CLOSURE;  Surgeon: [name], MD;  Location: [facility] ORS;  Service: General;  Laterality: N/A;  . CYSTOSCOPY    . FLEXIBLE SIGMOIDOSCOPY N/A 02/06/2017   Procedure: FLEXIBLE SIGMOIDOSCOPY;  Surgeon: Christene Lye, MD;  Location: [facility] ENDOSCOPY;  Service: Endoscopy;  Laterality: N/A;  . PORTACATH PLACEMENT N/A 06/17/2018   Procedure: INSERTION PORT-A-CATH, WITH FLUOROSCOPY;  Surgeon: [name], MD;  Location: [facility] ORS;  Service: General;  Laterality: N/A;    Social History   Socioeconomic History  . Marital status: Married    Spouse name: Not on file  . Number of children: Not on file  . Years of education: Not on file  . Highest education level: Not on file  Occupational History  . Not on file  Social Needs  . Financial resource strain: Not on  file  . Food insecurity:    Worry: Not on file    Inability: Not on file  . Transportation needs:    Medical: Not on file    Non-medical: Not on file  Tobacco Use  . Smoking status: Never Smoker  . Smokeless tobacco: Never Used  Substance and Sexual Activity  . Alcohol use: No  . Drug use: No  . Sexual activity: Not Currently  Lifestyle  . Physical activity:    Days per week: Not on file    Minutes per session: Not on file  . Stress: Not on file  Relationships  . Social connections:    Talks on phone: Not on file    Gets together: Not on file    Attends religious service: Not on file    Active member of club or organization: Not on file    Attends meetings of clubs or organizations: Not on file    Relationship status: Not on file  . Intimate partner violence:    Fear of current or ex partner: Not on file    Emotionally abused: Not on file    Physically abused: Not on file    Forced sexual activity: Not on file  Other Topics Concern  . Not on file  Social History Narrative  . Not on file    Family History  Problem Relation Age of Onset  . Diabetes Mother   . AAA (abdominal aortic aneurysm) Mother   .  Coronary artery disease Mother   . Diabetes Father   . Coronary artery disease Father   . Dementia Father   . Breast cancer Sister      Current Outpatient Medications:  .  aspirin EC 81 MG tablet, Take 81 mg by mouth daily., Disp: , Rfl:  .  Dulaglutide 0.75 MG/0.5ML SOPN, Inject into the skin once a week. , Disp: , Rfl:  .  glimepiride (AMARYL) 2 MG tablet, Take 8 mg by mouth daily with supper. , Disp: , Rfl:  .  glimepiride (AMARYL) 4 MG tablet, Take 4 mg by mouth 2 (two) times daily., Disp: , Rfl:  .  lidocaine-prilocaine (EMLA) cream, Place a small amount over cream over port 1 hour before each treatment, cover cream with saran wrap for clothing protection, Disp: 30 g, Rfl: 1 .  loratadine (CLARITIN) 10 MG tablet, Take 10 mg by mouth daily., Disp: , Rfl:  .   losartan (COZAAR) 25 MG tablet, Take 25 mg by mouth daily., Disp: , Rfl:  .  metFORMIN (GLUCOPHAGE) 1000 MG tablet, Take 1,000-1,500 tablets by mouth See admin instructions. TAKE 1 TABLET (1000 MG) BY MOUTH IN THE MORNING & TAKE 1.5 TABLETS (1500 MG) BY MOUTH AT SUPPER, Disp: , Rfl:  .  pravastatin (PRAVACHOL) 20 MG tablet, Take 20 mg by mouth daily with supper. , Disp: , Rfl:  .  acetaminophen (TYLENOL) 500 MG tablet, Take 500 mg by mouth every 6 (six) hours as needed (for pain.)., Disp: , Rfl:  .  HYDROcodone-Acetaminophen (VICODIN) 5-300 MG TABS, Take 1 tablet by mouth every 4 (four) hours as needed. (Patient not taking: Reported on 10/12/2018), Disp: 15 each, Rfl: 0 .  ondansetron (ZOFRAN) 8 MG tablet, Take 1 tablet (8 mg total) by mouth 2 (two) times daily as needed for refractory nausea / vomiting. Start on day 3 after chemotherapy. (Patient not taking: Reported on 10/12/2018), Disp: 30 tablet, Rfl: 1 .  prochlorperazine (COMPAZINE) 10 MG tablet, Take 1 tablet (10 mg total) by mouth every 6 (six) hours as needed (Nausea or vomiting). (Patient not taking: Reported on 10/12/2018), Disp: 30 tablet, Rfl: 1 .  triamcinolone (KENALOG) 0.025 % cream, Apply 1 application topically daily as needed (FOR EZCEMA). , Disp: , Rfl:  No current facility-administered medications for this visit.   Facility-Administered Medications Ordered in Other Visits:  .  0.9 %  sodium chloride infusion, , Intravenous, Continuous, Sindy Guadeloupe, MD, Stopped at 07/27/18 1037 .  fluorouracil (ADRUCIL) 4,850 mg in sodium chloride 0.9 % 53 mL chemo infusion, 2,400 mg/m2 (Treatment Plan Recorded), Intravenous, 1 day or 1 dose, Sindy Guadeloupe, MD .  fluorouracil (ADRUCIL) chemo injection 800 mg, 400 mg/m2 (Treatment Plan Recorded), Intravenous, Once, Sindy Guadeloupe, MD .  heparin lock flush 100 unit/mL, 500 Units, Intravenous, Once, Sindy Guadeloupe, MD .  heparin lock flush 100 unit/mL, 500 Units, Intravenous, Once, Sindy Guadeloupe,  MD .  leucovorin 800 mg in dextrose 5 % 250 mL infusion, 800 mg, Intravenous, Once, Sindy Guadeloupe, MD, Last Rate: 145 mL/hr at 10/12/18 1200, 800 mg at 10/12/18 1200 .  oxaliplatin (ELOXATIN) 130 mg in dextrose 5 % 500 mL chemo infusion, 65 mg/m2 (Treatment Plan Recorded), Intravenous, Once, Sindy Guadeloupe, MD, Last Rate: 263 mL/hr at 10/12/18 1201, 130 mg at 10/12/18 1201 .  sodium chloride flush (NS) 0.9 % injection 10 mL, 10 mL, Intravenous, PRN, Sindy Guadeloupe, MD, 10 mL at 07/27/18 0920 .  sodium chloride  flush (NS) 0.9 % injection 10 mL, 10 mL, Intravenous, PRN, Sindy Guadeloupe, MD, 10 mL at 10/12/18 0855  Physical exam:  Vitals:   10/12/18 0919  BP: 127/77  Pulse: 81  Resp: 18  Temp: (!) 95.5 F (35.3 C)  TempSrc: Tympanic  Weight: 187 lb 1.6 oz (84.9 kg)   Physical Exam  Constitutional: He is oriented to person, place, and time. He appears well-developed and well-nourished.  HENT:  Head: Normocephalic and atraumatic.  Eyes: Pupils are equal, round, and reactive to light. EOM are normal.  Neck: Normal range of motion.  Cardiovascular: Normal rate, regular rhythm and normal heart sounds.  Pulmonary/Chest: Effort normal and breath sounds normal.  Abdominal: Soft. Bowel sounds are normal.  Neurological: He is alert and oriented to person, place, and time.  Skin: Skin is warm and dry.     CMP Latest Ref Rng & Units 10/12/2018  Glucose 70 - 99 mg/dL 163(H)  BUN 8 - 23 mg/dL 19  Creatinine 0.61 - 1.24 mg/dL 1.24  Sodium 135 - 145 mmol/L 137  Potassium 3.5 - 5.1 mmol/L 4.3  Chloride 98 - 111 mmol/L 103  CO2 22 - 32 mmol/L 22  Calcium 8.9 - 10.3 mg/dL 9.6  Total Protein 6.5 - 8.1 g/dL 7.3  Total Bilirubin 0.3 - 1.2 mg/dL 0.6  Alkaline Phos 38 - 126 U/L 72  AST 15 - 41 U/L 26  ALT 0 - 44 U/L 16   CBC Latest Ref Rng & Units 10/12/2018  WBC 4.0 - 10.5 K/uL 7.8  Hemoglobin 13.0 - 17.0 g/dL 12.1(L)  Hematocrit 39.0 - 52.0 % 39.1  Platelets 150 - 400 K/uL 218    No images  are attached to the encounter.  Ct Chest W Contrast  Result Date: 09/17/2018 CLINICAL DATA:  Colon cancer diagnosed in 02/06/2017 post surgery, chemotherapy and immunotherapy. Subsequent progressive pulmonary metastatic disease. EXAM: CT CHEST, ABDOMEN, AND PELVIS WITH CONTRAST TECHNIQUE: Multidetector CT imaging of the chest, abdomen and pelvis was performed following the standard protocol during bolus administration of intravenous contrast. CONTRAST:  142m ISOVUE-300 IOPAMIDOL (ISOVUE-300) INJECTION 61% COMPARISON:  CT 06/18/2018 and PET-CT 03/19/2018. FINDINGS: CT CHEST FINDINGS Cardiovascular: Prominent coronary artery atherosclerosis is again noted with lesser atherosclerosis of the aorta and great vessels. Left subclavian Port-A-Cath extends to the superior cavoatrial junction. No acute vascular findings are demonstrated. The heart size is normal. There is no pericardial effusion. Mediastinum/Nodes: There are no enlarged mediastinal, hilar or axillary lymph nodes. Small mediastinal lymph nodes are stable. The thyroid gland, trachea and esophagus appear unremarkable. Lungs/Pleura: There is no pleural effusion. The previously demonstrated bilateral pulmonary nodules have decreased in size. The largest nodule on the right is in the lower lobe, measuring 9 x 7 mm on image 107/3 (previously 11 mm). This is now slightly cavitary. 7 mm left upper lobe nodule on image 61/3 previously measured 9 mm. There are lingular nodules measuring 12 x 11 mm on image 74/3 (previously 15 mm) and 9 mm on image 86/3 (previously 14 mm). No new or enlarging nodules. Musculoskeletal/Chest wall: No chest wall mass or suspicious osseous findings. Stable mild bilateral gynecomastia. There are stable scattered lucencies and paraspinal osteophytes within the thoracic spine. CT ABDOMEN AND PELVIS FINDINGS Hepatobiliary: The liver is normal in density without suspicious focal abnormality. Tiny lesion in the dome of the right hepatic lobe  on image 49/2 is stable, likely a cyst. Calcified gallstones are noted. There is no gallbladder wall thickening or biliary dilatation.  Pancreas: Unremarkable. No pancreatic ductal dilatation or surrounding inflammatory changes. Spleen: Normal in size without focal abnormality. Adrenals/Urinary Tract: Both adrenal glands appear normal. There is a stable punctate calculus in the upper pole of the right kidney. No evidence of ureteral calculus, hydronephrosis or renal mass. The bladder appears normal. Stomach/Bowel: No evidence of bowel wall thickening, distention or surrounding inflammatory change. The appendix appears normal. There are stable mild diverticular changes in the distal colon. Sigmoid anastomosis appears unchanged. Vascular/Lymphatic: There are no enlarged abdominal or pelvic lymph nodes. Mild aortic and branch vessel atherosclerosis. Reproductive: Dystrophic calcifications in the central prostate gland again noted. The seminal vesicles appear normal. Other: Postsurgical changes in the anterior abdominal wall. No ascites or peritoneal nodularity. Musculoskeletal: No acute or significant osseous findings. Stable small lucencies in the lumbar spine. IMPRESSION: 1. Interval decreased size of bilateral pulmonary nodules consistent with treated metastatic disease. No new or enlarging nodules or thoracic adenopathy identified. 2. No evidence metastatic disease in the abdomen or pelvis. 3. Stable incidental findings including cholelithiasis, distal colonic diverticulosis, coronary and Aortic Atherosclerosis (ICD10-I70.0). Electronically Signed   By: Richardean Sale M.D.   On: 09/17/2018 16:40   Ct Abdomen Pelvis W Contrast  Result Date: 09/17/2018 CLINICAL DATA:  Colon cancer diagnosed in 02/06/2017 post surgery, chemotherapy and immunotherapy. Subsequent progressive pulmonary metastatic disease. EXAM: CT CHEST, ABDOMEN, AND PELVIS WITH CONTRAST TECHNIQUE: Multidetector CT imaging of the chest, abdomen and  pelvis was performed following the standard protocol during bolus administration of intravenous contrast. CONTRAST:  197m ISOVUE-300 IOPAMIDOL (ISOVUE-300) INJECTION 61% COMPARISON:  CT 06/18/2018 and PET-CT 03/19/2018. FINDINGS: CT CHEST FINDINGS Cardiovascular: Prominent coronary artery atherosclerosis is again noted with lesser atherosclerosis of the aorta and great vessels. Left subclavian Port-A-Cath extends to the superior cavoatrial junction. No acute vascular findings are demonstrated. The heart size is normal. There is no pericardial effusion. Mediastinum/Nodes: There are no enlarged mediastinal, hilar or axillary lymph nodes. Small mediastinal lymph nodes are stable. The thyroid gland, trachea and esophagus appear unremarkable. Lungs/Pleura: There is no pleural effusion. The previously demonstrated bilateral pulmonary nodules have decreased in size. The largest nodule on the right is in the lower lobe, measuring 9 x 7 mm on image 107/3 (previously 11 mm). This is now slightly cavitary. 7 mm left upper lobe nodule on image 61/3 previously measured 9 mm. There are lingular nodules measuring 12 x 11 mm on image 74/3 (previously 15 mm) and 9 mm on image 86/3 (previously 14 mm). No new or enlarging nodules. Musculoskeletal/Chest wall: No chest wall mass or suspicious osseous findings. Stable mild bilateral gynecomastia. There are stable scattered lucencies and paraspinal osteophytes within the thoracic spine. CT ABDOMEN AND PELVIS FINDINGS Hepatobiliary: The liver is normal in density without suspicious focal abnormality. Tiny lesion in the dome of the right hepatic lobe on image 49/2 is stable, likely a cyst. Calcified gallstones are noted. There is no gallbladder wall thickening or biliary dilatation. Pancreas: Unremarkable. No pancreatic ductal dilatation or surrounding inflammatory changes. Spleen: Normal in size without focal abnormality. Adrenals/Urinary Tract: Both adrenal glands appear normal. There is  a stable punctate calculus in the upper pole of the right kidney. No evidence of ureteral calculus, hydronephrosis or renal mass. The bladder appears normal. Stomach/Bowel: No evidence of bowel wall thickening, distention or surrounding inflammatory change. The appendix appears normal. There are stable mild diverticular changes in the distal colon. Sigmoid anastomosis appears unchanged. Vascular/Lymphatic: There are no enlarged abdominal or pelvic lymph nodes. Mild aortic and branch  vessel atherosclerosis. Reproductive: Dystrophic calcifications in the central prostate gland again noted. The seminal vesicles appear normal. Other: Postsurgical changes in the anterior abdominal wall. No ascites or peritoneal nodularity. Musculoskeletal: No acute or significant osseous findings. Stable small lucencies in the lumbar spine. IMPRESSION: 1. Interval decreased size of bilateral pulmonary nodules consistent with treated metastatic disease. No new or enlarging nodules or thoracic adenopathy identified. 2. No evidence metastatic disease in the abdomen or pelvis. 3. Stable incidental findings including cholelithiasis, distal colonic diverticulosis, coronary and Aortic Atherosclerosis (ICD10-I70.0). Electronically Signed   By: Richardean Sale M.D.   On: 09/17/2018 16:40     Assessment and plan- Patient is a 77 y.o. male stage IV colon adenocarcinoma with lung metastases. He is here for on treatment assessment prior to cycle 8 of FOLFOX Avastin chemotherapy  Counts okay to proceed with cycle 8 of FOLFOX Avastin chemotherapy today.  I will see him back in 2 weeks time with CBC CMP and urine protein for cycle #9.  Plan to obtain repeat scans after 12 cycles.  Patient's blood pressure is stable and urine protein is trace.  Okay to give Avastin today  Blood sugars are better controlled after patient has been started on Trulicity   Visit Diagnosis 1. 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[place])   2. Encounter for antineoplastic  chemotherapy      Dr. Randa Evens, MD, MPH [facility] [phone] 10/12/2018 12:53 PM

## 2018-10-14 ENCOUNTER — Inpatient Hospital Stay: Payer: Medicare Other

## 2018-10-14 VITALS — BP 128/71 | HR 75 | Temp 95.2°F | Resp 18

## 2018-10-14 DIAGNOSIS — C189 Malignant neoplasm of colon, unspecified: Secondary | ICD-10-CM

## 2018-10-14 DIAGNOSIS — Z5111 Encounter for antineoplastic chemotherapy: Secondary | ICD-10-CM | POA: Diagnosis not present

## 2018-10-14 DIAGNOSIS — C7800 Secondary malignant neoplasm of unspecified lung: Secondary | ICD-10-CM

## 2018-10-14 MED ORDER — PEGFILGRASTIM-CBQV 6 MG/0.6ML ~~LOC~~ SOSY
6.0000 mg | PREFILLED_SYRINGE | Freq: Once | SUBCUTANEOUS | Status: AC
  Administered 2018-10-14: 6 mg via SUBCUTANEOUS
  Filled 2018-10-14: qty 0.6

## 2018-10-14 MED ORDER — HEPARIN SOD (PORK) LOCK FLUSH 100 UNIT/ML IV SOLN
500.0000 [IU] | Freq: Once | INTRAVENOUS | Status: AC | PRN
  Administered 2018-10-14: 500 [IU]
  Filled 2018-10-14: qty 5

## 2018-10-25 NOTE — Progress Notes (Signed)
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 Patient is a 77 year old male who presented with evidence of bowel obstruction on 02/06/2017. At that time he had progressive abdominal distention and no bowel movement for about one week. CT abdomen showed an apple core lesion in the descending/sigmoid colon.  2. Patient underwent Hartmann's procedure for obstructing sigmoid colon mass on 02/06/2017. Preoperative CEA was 5.0  3. 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 It is 0 out of 15 lymph nodes were positive for malignancy. Perineural and lymphovascular invasion was present. Margins were negative.pT3N0. MMR stable.  4. Given that he had high risk stage II colon cancer including he presented with obstruction and has LVI and perineural invasion- adjuvant xeloda chemotherapy was recommended for 6 months.   5.Patient had evidence of hand foot syndrome after cycle 3. Cycle 4 delayed by 1 week. Topical urea cream prescribed  6. Patient completed 8 cycles of adjuvant xeloda in October 2018. polps seen in sigmoid and decending colon but negative for malignancy  7. Repeat Ct abdomen after 8 cycles showed no metastatic disease. 6 mm lung nodule noted in RLL  8.Patient had a repeat colonoscopy in October 2018 which showed polyps in his transverse and descending colon which were taken out and were negative for  high-grade dysplasia or malignancy. Patient subsequently underwent colostomy takedown procedure by Dr. Burt Knack  9. Repeat CT chest abdomen/ pelvis showed increase in the size of previously seen lung nodules largest being 1.4 cm consistent with metastatic disease. PET/CT showed mild uptake in 2 lung nodules. Others were not hypermetabolic. No other evidence of metastatic disease  10. Repeat lung biopsy consistent with colon adenocarcinoma. Comprehensive RAS panel testing showed mutant KRAS  11.Given the slow rate of growth of his colon cancer and desire to maintain his quality of life plan was to start chemotherapy after he gets a 57-monthbreak in summer. Repeat CT chest abdomen and pelvis in August 2019 showed mild increase in the size of his lung nodules. No new lung nodules are no new sites of distant metastatic disease.FOLFOX Avastin chemotherapy started on 06/22/2018   Interval history-he is tolerating chemotherapy well and reports no significant side effects.  Denies any fatigue nausea vomiting.  He has occasional cold sensitivity only when exposed to cold but not otherwise.  Appetite is good and his weight is stable.  ECOG PS- 0 Pain scale- 0 Opioid associated constipation- no  Review of systems- Review of Systems  Constitutional: Negative for chills, fever, malaise/fatigue and weight loss.  HENT: Negative for congestion, ear discharge and nosebleeds.   Eyes: Negative for blurred vision.  Respiratory: Negative for cough, hemoptysis, sputum production, shortness of breath and wheezing.   Cardiovascular: Negative for chest pain, palpitations, orthopnea and claudication.  Gastrointestinal: Negative for abdominal pain, blood in stool, constipation, diarrhea, heartburn, melena, nausea and vomiting.  Genitourinary: Negative for dysuria, flank pain, frequency, hematuria and urgency.  Musculoskeletal: Negative for back pain, joint pain and myalgias.  Skin: Negative for rash.  Neurological: Negative for dizziness, tingling, focal weakness, seizures, weakness and headaches.  Endo/Heme/Allergies: Does not bruise/bleed easily.  Psychiatric/Behavioral: Negative for depression and suicidal ideas. The patient does not have insomnia.        No Known Allergies   Past Medical History:  Diagnosis Date  . Cancer ([place])    skin cancer-nose w graft,also shoulder- basal cell per pt  . Cataract    r eye  . Chronic kidney disease    kidney stones 20 y ago per pt  . Colon cancer ([place])   . Diabetes mellitus without complication ([place])   . Eczema   . History of kidney stones   . Hyperlipidemia   . Hypertension   . Vertigo      Past Surgical History:  Procedure Laterality Date  . CATARACT EXTRACTION Left   . COLECTOMY WITH COLOSTOMY CREATION/HARTMANN PROCEDURE N/A 02/06/2017   Procedure: COLECTOMY WITH COLOSTOMY CREATION/HARTMANN PROCEDURE;  Surgeon: [name], MD;  Location: [facility] ORS;  Service: General;  Laterality: N/A;  . COLON SURGERY    . COLONOSCOPY WITH PROPOFOL N/A 09/04/2017   Procedure: COLONOSCOPY WITH PROPOFOL;  Surgeon: Jonathon Bellows, MD;  Location: [facility] ENDOSCOPY;  Service: Gastroenterology;  Laterality: N/A;  . COLOSTOMY CLOSURE N/A 10/20/2017   Procedure: COLOSTOMY CLOSURE;  Surgeon: [name], MD;  Location: [facility] ORS;  Service: General;  Laterality: N/A;  . CYSTOSCOPY    . FLEXIBLE SIGMOIDOSCOPY N/A 02/06/2017   Procedure: FLEXIBLE SIGMOIDOSCOPY;  Surgeon: Christene Lye, MD;  Location: [facility] ENDOSCOPY;  Service: Endoscopy;  Laterality: N/A;  . PORTACATH PLACEMENT N/A 06/17/2018   Procedure: INSERTION PORT-A-CATH, WITH FLUOROSCOPY;  Surgeon: [name], MD;  Location: [facility] ORS;  Service: General;  Laterality: N/A;    Social History   Socioeconomic History  . Marital status: Married    Spouse name: Not on file  . Number of children: Not on file  . Years of education: Not on file  . Highest education level: Not on file   Occupational History  . Not on file  Social Needs  . Financial resource strain: Not on file  . Food insecurity:    Worry: Not on file    Inability: Not on file  . Transportation needs:    Medical: Not on file    Non-medical: Not on file  Tobacco Use  . Smoking status: Never Smoker  . Smokeless tobacco: Never Used  Substance and Sexual Activity  . Alcohol use: No  . Drug use: No  . Sexual activity: Not Currently  Lifestyle  . Physical activity:    Days per week: Not on file    Minutes per session: Not on file  . Stress: Not on file  Relationships  . Social connections:    Talks on phone: Not on file    Gets together: Not on file    Attends religious service: Not on file    Active member of club or organization: Not on file    Attends meetings of clubs or organizations: Not on file    Relationship status: Not on file  . Intimate partner violence:    Fear of current or ex partner: Not on file    Emotionally abused: Not on file    Physically abused: Not on file    Forced sexual activity: Not on file  Other Topics Concern  . Not on file  Social History Narrative  . Not on file    Family History  Problem Relation Age of Onset  .  Diabetes Mother   . AAA (abdominal aortic aneurysm) Mother   . Coronary artery disease Mother   . Diabetes Father   . Coronary artery disease Father   . Dementia Father   . Breast cancer Sister      Current Outpatient Medications:  .  acetaminophen (TYLENOL) 500 MG tablet, Take 500 mg by mouth every 6 (six) hours as needed (for pain.)., Disp: , Rfl:  .  aspirin EC 81 MG tablet, Take 81 mg by mouth daily., Disp: , Rfl:  .  Dulaglutide 0.75 MG/0.5ML SOPN, Inject into the skin once a week. , Disp: , Rfl:  .  glimepiride (AMARYL) 2 MG tablet, Take 8 mg by mouth daily with supper. , Disp: , Rfl:  .  glimepiride (AMARYL) 4 MG tablet, Take 4 mg by mouth 2 (two) times daily., Disp: , Rfl:  .  HYDROcodone-Acetaminophen (VICODIN) 5-300 MG TABS,  Take 1 tablet by mouth every 4 (four) hours as needed. (Patient not taking: Reported on 10/12/2018), Disp: 15 each, Rfl: 0 .  lidocaine-prilocaine (EMLA) cream, Place a small amount over cream over port 1 hour before each treatment, cover cream with saran wrap for clothing protection, Disp: 30 g, Rfl: 1 .  loratadine (CLARITIN) 10 MG tablet, Take 10 mg by mouth daily., Disp: , Rfl:  .  losartan (COZAAR) 25 MG tablet, Take 25 mg by mouth daily., Disp: , Rfl:  .  metFORMIN (GLUCOPHAGE) 1000 MG tablet, Take 1,000-1,500 tablets by mouth See admin instructions. TAKE 1 TABLET (1000 MG) BY MOUTH IN THE MORNING & TAKE 1.5 TABLETS (1500 MG) BY MOUTH AT SUPPER, Disp: , Rfl:  .  ondansetron (ZOFRAN) 8 MG tablet, Take 1 tablet (8 mg total) by mouth 2 (two) times daily as needed for refractory nausea / vomiting. Start on day 3 after chemotherapy. (Patient not taking: Reported on 10/12/2018), Disp: 30 tablet, Rfl: 1 .  pravastatin (PRAVACHOL) 20 MG tablet, Take 20 mg by mouth daily with supper. , Disp: , Rfl:  .  prochlorperazine (COMPAZINE) 10 MG tablet, Take 1 tablet (10 mg total) by mouth every 6 (six) hours as needed (Nausea or vomiting). (Patient not taking: Reported on 10/12/2018), Disp: 30 tablet, Rfl: 1 .  triamcinolone (KENALOG) 0.025 % cream, Apply 1 application topically daily as needed (FOR EZCEMA). , Disp: , Rfl:  No current facility-administered medications for this visit.   Facility-Administered Medications Ordered in Other Visits:  .  0.9 %  sodium chloride infusion, , Intravenous, Continuous, Sindy Guadeloupe, MD, Stopped at 07/27/18 1037 .  heparin lock flush 100 unit/mL, 500 Units, Intravenous, Once, Sindy Guadeloupe, MD .  sodium chloride flush (NS) 0.9 % injection 10 mL, 10 mL, Intravenous, PRN, Sindy Guadeloupe, MD, 10 mL at 07/27/18 0920  Physical exam:  Vitals:   10/26/18 0841  BP: (!) 145/77  Pulse: 72  Resp: 18  Temp: (!) 96 F (35.6 C)  Weight: 184 lb 8 oz (83.7 kg)   Physical  Exam HENT:     Head: Normocephalic and atraumatic.  Eyes:     Pupils: Pupils are equal, round, and reactive to light.  Neck:     Musculoskeletal: Normal range of motion.  Cardiovascular:     Rate and Rhythm: Normal rate and regular rhythm.     Heart sounds: Normal heart sounds.  Pulmonary:     Effort: Pulmonary effort is normal.     Breath sounds: Normal breath sounds.  Abdominal:     General: Bowel  sounds are normal.     Palpations: Abdomen is soft.  Skin:    General: Skin is warm and dry.  Neurological:     Mental Status: He is alert and oriented to person, place, and time.      CMP Latest Ref Rng & Units 10/26/2018  Glucose 70 - 99 mg/dL 200(H)  BUN 8 - 23 mg/dL 13  Creatinine 0.61 - 1.24 mg/dL 1.04  Sodium 135 - 145 mmol/L 138  Potassium 3.5 - 5.1 mmol/L 3.5  Chloride 98 - 111 mmol/L 107  CO2 22 - 32 mmol/L 21(L)  Calcium 8.9 - 10.3 mg/dL 8.9  Total Protein 6.5 - 8.1 g/dL 6.7  Total Bilirubin 0.3 - 1.2 mg/dL 0.4  Alkaline Phos 38 - 126 U/L 94  AST 15 - 41 U/L 25  ALT 0 - 44 U/L 14   CBC Latest Ref Rng & Units 10/26/2018  WBC 4.0 - 10.5 K/uL 9.8  Hemoglobin 13.0 - 17.0 g/dL 11.7(L)  Hematocrit 39.0 - 52.0 % 36.8(L)  Platelets 150 - 400 K/uL 146(L)      Assessment and plan- Patient is a 77 y.o. male stage IV colon adenocarcinoma with lung metastases. He is here for on treatment assessment prior to cycle 9 of FOLFOX Avastin chemotherapy  Counts okay to proceed with cycle 9 of FOLFOX Avastin chemotherapy today.  Blood pressure is stable and urine protein is negative.  He can therefore get Avastin today  He will be seen in 2 weeks time by covering provider with a CBC and CMP for cycle 10.  I will see him back in 4 weeks time with CBC CMP cycle 11 of FOLFOX Avastin chemotherapy.  Plan is to repeat scans after 12 cycles   Visit Diagnosis 1. 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[place])   2. Malignant neoplasm metastatic to lung, unspecified laterality ([place])   3. Encounter for  antineoplastic chemotherapy   4. Encounter for monoclonal antibody treatment for malignancy      Dr. Randa Evens, MD, MPH [facility] [phone] 10/26/2018 10:13 AM

## 2018-10-26 ENCOUNTER — Encounter: Payer: Self-pay | Admitting: Oncology

## 2018-10-26 ENCOUNTER — Inpatient Hospital Stay: Payer: Medicare Other

## 2018-10-26 ENCOUNTER — Inpatient Hospital Stay (HOSPITAL_BASED_OUTPATIENT_CLINIC_OR_DEPARTMENT_OTHER): Payer: Medicare Other | Admitting: Oncology

## 2018-10-26 ENCOUNTER — Other Ambulatory Visit: Payer: Self-pay

## 2018-10-26 VITALS — BP 145/77 | HR 72 | Temp 96.0°F | Resp 18 | Wt 184.5 lb

## 2018-10-26 DIAGNOSIS — Z5111 Encounter for antineoplastic chemotherapy: Secondary | ICD-10-CM

## 2018-10-26 DIAGNOSIS — Z803 Family history of malignant neoplasm of breast: Secondary | ICD-10-CM | POA: Diagnosis not present

## 2018-10-26 DIAGNOSIS — C7802 Secondary malignant neoplasm of left lung: Secondary | ICD-10-CM | POA: Diagnosis not present

## 2018-10-26 DIAGNOSIS — C7800 Secondary malignant neoplasm of unspecified lung: Secondary | ICD-10-CM

## 2018-10-26 DIAGNOSIS — C189 Malignant neoplasm of colon, unspecified: Secondary | ICD-10-CM

## 2018-10-26 DIAGNOSIS — C7801 Secondary malignant neoplasm of right lung: Secondary | ICD-10-CM | POA: Diagnosis not present

## 2018-10-26 DIAGNOSIS — C187 Malignant neoplasm of sigmoid colon: Secondary | ICD-10-CM

## 2018-10-26 DIAGNOSIS — Z5112 Encounter for antineoplastic immunotherapy: Secondary | ICD-10-CM

## 2018-10-26 LAB — CBC WITH DIFFERENTIAL/PLATELET
Abs Immature Granulocytes: 0.08 10*3/uL — ABNORMAL HIGH (ref 0.00–0.07)
BASOS PCT: 0 %
Basophils Absolute: 0 10*3/uL (ref 0.0–0.1)
Eosinophils Absolute: 0.3 10*3/uL (ref 0.0–0.5)
Eosinophils Relative: 3 %
HCT: 36.8 % — ABNORMAL LOW (ref 39.0–52.0)
Hemoglobin: 11.7 g/dL — ABNORMAL LOW (ref 13.0–17.0)
IMMATURE GRANULOCYTES: 1 %
Lymphocytes Relative: 24 %
Lymphs Abs: 2.3 10*3/uL (ref 0.7–4.0)
MCH: 29.9 pg (ref 26.0–34.0)
MCHC: 31.8 g/dL (ref 30.0–36.0)
MCV: 94.1 fL (ref 80.0–100.0)
Monocytes Absolute: 0.5 10*3/uL (ref 0.1–1.0)
Monocytes Relative: 5 %
NEUTROS ABS: 6.6 10*3/uL (ref 1.7–7.7)
Neutrophils Relative %: 67 %
PLATELETS: 146 10*3/uL — AB (ref 150–400)
RBC: 3.91 MIL/uL — ABNORMAL LOW (ref 4.22–5.81)
RDW: 16.4 % — ABNORMAL HIGH (ref 11.5–15.5)
WBC: 9.8 10*3/uL (ref 4.0–10.5)
nRBC: 0 % (ref 0.0–0.2)

## 2018-10-26 LAB — COMPREHENSIVE METABOLIC PANEL
ALT: 14 U/L (ref 0–44)
ANION GAP: 10 (ref 5–15)
AST: 25 U/L (ref 15–41)
Albumin: 3.9 g/dL (ref 3.5–5.0)
Alkaline Phosphatase: 94 U/L (ref 38–126)
BUN: 13 mg/dL (ref 8–23)
CO2: 21 mmol/L — ABNORMAL LOW (ref 22–32)
Calcium: 8.9 mg/dL (ref 8.9–10.3)
Chloride: 107 mmol/L (ref 98–111)
Creatinine, Ser: 1.04 mg/dL (ref 0.61–1.24)
GFR calc Af Amer: >60 mL/min (ref >60)
GFR calc non Af Amer: >60 mL/min (ref >60)
Glucose, Bld: 200 mg/dL — ABNORMAL HIGH (ref 70–99)
POTASSIUM: 3.5 mmol/L (ref 3.5–5.1)
Sodium: 138 mmol/L (ref 135–145)
Total Bilirubin: 0.4 mg/dL (ref 0.3–1.2)
Total Protein: 6.7 g/dL (ref 6.5–8.1)

## 2018-10-26 LAB — PROTEIN, URINE, RANDOM: Total Protein, Urine: 10 mg/dL

## 2018-10-26 MED ORDER — DEXAMETHASONE SODIUM PHOSPHATE 10 MG/ML IJ SOLN
10.0000 mg | Freq: Once | INTRAMUSCULAR | Status: AC
  Administered 2018-10-26: 10 mg via INTRAVENOUS
  Filled 2018-10-26: qty 1

## 2018-10-26 MED ORDER — PALONOSETRON HCL INJECTION 0.25 MG/5ML
0.2500 mg | Freq: Once | INTRAVENOUS | Status: AC
  Administered 2018-10-26: 0.25 mg via INTRAVENOUS
  Filled 2018-10-26: qty 5

## 2018-10-26 MED ORDER — DEXTROSE 5 % IV SOLN
Freq: Once | INTRAVENOUS | Status: DC
  Filled 2018-10-26: qty 250

## 2018-10-26 MED ORDER — SODIUM CHLORIDE 0.9 % IV SOLN
5.0000 mg/kg | Freq: Once | INTRAVENOUS | Status: AC
  Administered 2018-10-26: 450 mg via INTRAVENOUS
  Filled 2018-10-26: qty 18

## 2018-10-26 MED ORDER — SODIUM CHLORIDE 0.9 % IV SOLN
2400.0000 mg/m2 | INTRAVENOUS | Status: DC
  Administered 2018-10-26: 4850 mg via INTRAVENOUS
  Filled 2018-10-26: qty 97

## 2018-10-26 MED ORDER — FLUOROURACIL CHEMO INJECTION 2.5 GM/50ML
400.0000 mg/m2 | Freq: Once | INTRAVENOUS | Status: AC
  Administered 2018-10-26: 800 mg via INTRAVENOUS
  Filled 2018-10-26: qty 16

## 2018-10-26 MED ORDER — HEPARIN SOD (PORK) LOCK FLUSH 100 UNIT/ML IV SOLN
500.0000 [IU] | Freq: Once | INTRAVENOUS | Status: DC | PRN
  Filled 2018-10-26: qty 5

## 2018-10-26 MED ORDER — OXALIPLATIN CHEMO INJECTION 100 MG/20ML
65.0000 mg/m2 | Freq: Once | INTRAVENOUS | Status: AC
  Administered 2018-10-26: 130 mg via INTRAVENOUS
  Filled 2018-10-26: qty 26

## 2018-10-26 MED ORDER — SODIUM CHLORIDE 0.9 % IV SOLN
Freq: Once | INTRAVENOUS | Status: AC
  Administered 2018-10-26: 10:00:00 via INTRAVENOUS
  Filled 2018-10-26: qty 250

## 2018-10-26 MED ORDER — LEUCOVORIN CALCIUM INJECTION 350 MG
800.0000 mg | Freq: Once | INTRAVENOUS | Status: AC
  Administered 2018-10-26: 800 mg via INTRAVENOUS
  Filled 2018-10-26: qty 40

## 2018-10-26 MED ORDER — DEXTROSE 5 % IV SOLN
Freq: Once | INTRAVENOUS | Status: AC
  Administered 2018-10-26: 11:00:00 via INTRAVENOUS
  Filled 2018-10-26: qty 250

## 2018-10-26 NOTE — Progress Notes (Signed)
Here for follow up. Overall "doing well "

## 2018-10-26 NOTE — Addendum Note (Signed)
Addended by: Wallis Bamberg A on: 10/26/2018 12:05 PM   Modules accepted: Orders

## 2018-10-26 NOTE — Addendum Note (Signed)
Addended by: Wallis Bamberg A on: 10/26/2018 12:07 PM   Modules accepted: Orders

## 2018-10-28 ENCOUNTER — Inpatient Hospital Stay: Payer: Medicare Other

## 2018-10-28 VITALS — BP 136/69 | HR 70 | Resp 18

## 2018-10-28 DIAGNOSIS — C7800 Secondary malignant neoplasm of unspecified lung: Secondary | ICD-10-CM

## 2018-10-28 DIAGNOSIS — C189 Malignant neoplasm of colon, unspecified: Secondary | ICD-10-CM

## 2018-10-28 DIAGNOSIS — Z5111 Encounter for antineoplastic chemotherapy: Secondary | ICD-10-CM | POA: Diagnosis not present

## 2018-10-28 MED ORDER — HEPARIN SOD (PORK) LOCK FLUSH 100 UNIT/ML IV SOLN
500.0000 [IU] | Freq: Once | INTRAVENOUS | Status: AC | PRN
  Administered 2018-10-28: 500 [IU]
  Filled 2018-10-28: qty 5

## 2018-10-28 MED ORDER — PEGFILGRASTIM-CBQV 6 MG/0.6ML ~~LOC~~ SOSY
6.0000 mg | PREFILLED_SYRINGE | Freq: Once | SUBCUTANEOUS | Status: AC
  Administered 2018-10-28: 6 mg via SUBCUTANEOUS
  Filled 2018-10-28: qty 0.6

## 2018-11-09 ENCOUNTER — Encounter: Payer: Self-pay | Admitting: Nurse Practitioner

## 2018-11-09 ENCOUNTER — Inpatient Hospital Stay (HOSPITAL_BASED_OUTPATIENT_CLINIC_OR_DEPARTMENT_OTHER): Payer: Medicare Other | Admitting: Nurse Practitioner

## 2018-11-09 ENCOUNTER — Inpatient Hospital Stay: Payer: Medicare Other

## 2018-11-09 VITALS — BP 139/68 | HR 67 | Resp 18

## 2018-11-09 VITALS — BP 159/80 | HR 71 | Temp 97.6°F | Resp 18 | Ht 66.0 in | Wt 184.1 lb

## 2018-11-09 DIAGNOSIS — C189 Malignant neoplasm of colon, unspecified: Secondary | ICD-10-CM

## 2018-11-09 DIAGNOSIS — C7801 Secondary malignant neoplasm of right lung: Secondary | ICD-10-CM | POA: Diagnosis not present

## 2018-11-09 DIAGNOSIS — C7802 Secondary malignant neoplasm of left lung: Secondary | ICD-10-CM | POA: Diagnosis not present

## 2018-11-09 DIAGNOSIS — C7800 Secondary malignant neoplasm of unspecified lung: Secondary | ICD-10-CM

## 2018-11-09 DIAGNOSIS — C187 Malignant neoplasm of sigmoid colon: Secondary | ICD-10-CM

## 2018-11-09 DIAGNOSIS — Z5111 Encounter for antineoplastic chemotherapy: Secondary | ICD-10-CM | POA: Diagnosis not present

## 2018-11-09 LAB — COMPREHENSIVE METABOLIC PANEL
ALT: 12 U/L (ref 0–44)
AST: 22 U/L (ref 15–41)
Albumin: 3.9 g/dL (ref 3.5–5.0)
Alkaline Phosphatase: 101 U/L (ref 38–126)
Anion gap: 10 (ref 5–15)
BUN: 12 mg/dL (ref 8–23)
CHLORIDE: 107 mmol/L (ref 98–111)
CO2: 21 mmol/L — ABNORMAL LOW (ref 22–32)
Calcium: 8.7 mg/dL — ABNORMAL LOW (ref 8.9–10.3)
Creatinine, Ser: 1 mg/dL (ref 0.61–1.24)
GFR calc Af Amer: >60 mL/min (ref >60)
GFR calc non Af Amer: >60 mL/min (ref >60)
Glucose, Bld: 180 mg/dL — ABNORMAL HIGH (ref 70–99)
Potassium: 3.5 mmol/L (ref 3.5–5.1)
Sodium: 138 mmol/L (ref 135–145)
Total Bilirubin: 0.3 mg/dL (ref 0.3–1.2)
Total Protein: 6.9 g/dL (ref 6.5–8.1)

## 2018-11-09 LAB — PROTEIN, URINE, RANDOM: TOTAL PROTEIN, URINE: 22 mg/dL

## 2018-11-09 LAB — CBC WITH DIFFERENTIAL/PLATELET
Abs Immature Granulocytes: 0.07 10*3/uL (ref 0.00–0.07)
Basophils Absolute: 0.1 10*3/uL (ref 0.0–0.1)
Basophils Relative: 1 %
Eosinophils Absolute: 0.2 10*3/uL (ref 0.0–0.5)
Eosinophils Relative: 2 %
HCT: 37.5 % — ABNORMAL LOW (ref 39.0–52.0)
Hemoglobin: 11.8 g/dL — ABNORMAL LOW (ref 13.0–17.0)
Immature Granulocytes: 1 %
Lymphocytes Relative: 27 %
Lymphs Abs: 2.4 10*3/uL (ref 0.7–4.0)
MCH: 29.6 pg (ref 26.0–34.0)
MCHC: 31.5 g/dL (ref 30.0–36.0)
MCV: 94 fL (ref 80.0–100.0)
Monocytes Absolute: 0.6 10*3/uL (ref 0.1–1.0)
Monocytes Relative: 7 %
Neutro Abs: 5.5 10*3/uL (ref 1.7–7.7)
Neutrophils Relative %: 62 %
Platelets: 155 10*3/uL (ref 150–400)
RBC: 3.99 MIL/uL — AB (ref 4.22–5.81)
RDW: 15.3 % (ref 11.5–15.5)
WBC: 8.9 10*3/uL (ref 4.0–10.5)
nRBC: 0 % (ref 0.0–0.2)

## 2018-11-09 MED ORDER — SODIUM CHLORIDE 0.9 % IV SOLN
INTRAVENOUS | Status: DC
  Administered 2018-11-09: 09:00:00 via INTRAVENOUS
  Filled 2018-11-09: qty 250

## 2018-11-09 MED ORDER — LEUCOVORIN CALCIUM INJECTION 350 MG
800.0000 mg | Freq: Once | INTRAVENOUS | Status: AC
  Administered 2018-11-09: 800 mg via INTRAVENOUS
  Filled 2018-11-09: qty 25

## 2018-11-09 MED ORDER — FLUOROURACIL CHEMO INJECTION 2.5 GM/50ML
400.0000 mg/m2 | Freq: Once | INTRAVENOUS | Status: AC
  Administered 2018-11-09: 800 mg via INTRAVENOUS
  Filled 2018-11-09: qty 16

## 2018-11-09 MED ORDER — PALONOSETRON HCL INJECTION 0.25 MG/5ML
0.2500 mg | Freq: Once | INTRAVENOUS | Status: AC
  Administered 2018-11-09: 0.25 mg via INTRAVENOUS
  Filled 2018-11-09: qty 5

## 2018-11-09 MED ORDER — SODIUM CHLORIDE 0.9% FLUSH
10.0000 mL | INTRAVENOUS | Status: DC | PRN
  Administered 2018-11-09: 10 mL via INTRAVENOUS
  Filled 2018-11-09: qty 10

## 2018-11-09 MED ORDER — SODIUM CHLORIDE 0.9 % IV SOLN
2400.0000 mg/m2 | INTRAVENOUS | Status: DC
  Administered 2018-11-09: 4850 mg via INTRAVENOUS
  Filled 2018-11-09: qty 97

## 2018-11-09 MED ORDER — DEXAMETHASONE SODIUM PHOSPHATE 10 MG/ML IJ SOLN
10.0000 mg | Freq: Once | INTRAMUSCULAR | Status: AC
  Administered 2018-11-09: 10 mg via INTRAVENOUS
  Filled 2018-11-09: qty 1

## 2018-11-09 MED ORDER — DEXTROSE 5 % IV SOLN
Freq: Once | INTRAVENOUS | Status: AC
  Administered 2018-11-09: 10:00:00 via INTRAVENOUS
  Filled 2018-11-09: qty 250

## 2018-11-09 MED ORDER — SODIUM CHLORIDE 0.9 % IV SOLN
5.0000 mg/kg | Freq: Once | INTRAVENOUS | Status: AC
  Administered 2018-11-09: 450 mg via INTRAVENOUS
  Filled 2018-11-09: qty 2

## 2018-11-09 MED ORDER — OXALIPLATIN CHEMO INJECTION 100 MG/20ML
65.0000 mg/m2 | Freq: Once | INTRAVENOUS | Status: AC
  Administered 2018-11-09: 130 mg via INTRAVENOUS
  Filled 2018-11-09: qty 20

## 2018-11-09 NOTE — Progress Notes (Signed)
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 Patient presented as a 77 year old male with evidence of bowel obstruction on 02/06/2017. At that time he had progressive abdominal distention and no bowel movement for about one week. CT abdomen showed an apple core lesion in the descending/sigmoid colon.  2. Patient underwent Hartmann's procedure for obstructing sigmoid colon mass on 02/06/2017. Preoperative CEA was 5.0  3. 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 It is 0 out of 15 lymph nodes were positive for malignancy. Perineural and lymphovascular invasion was present. Margins were negative.pT3N0. MMR stable.  4. Given that he had high risk stage II colon cancer including he presented with obstruction and has LVI and perineural invasion- adjuvant xeloda chemotherapy was recommended for 6 months.   5.Patient had evidence of hand foot syndrome after cycle 3. Cycle 4 delayed by 1 week. Topical urea cream prescribed  6. Patient completed 8 cycles of adjuvant xeloda in October 2018. polps seen in sigmoid and decending colon but negative for malignancy  7. Repeat Ct abdomen after 8 cycles showed no metastatic disease. 6 mm lung nodule noted in RLL  8.Patient had a repeat colonoscopy in October 2018 which showed polyps in his transverse and descending colon which were taken out and were negative for high-grade  dysplasia or malignancy. Patient subsequently underwent colostomy takedown procedure by Dr. Burt Knack  9. Repeat CT chest abdomen/ pelvis showed increase in the size of previously seen lung nodules largest being 1.4 cm consistent with metastatic disease. PET/CT showed mild uptake in 2 lung nodules. Others were not hypermetabolic. No other evidence of metastatic disease  10. Repeat lung biopsy consistent with colon adenocarcinoma. 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 Repeat CT chest abdomen and pelvis in August 2019 showed mild increase in the size of his lung nodules. No new lung nodules and no new sites of distant metastatic disease.FOLFOX Avastin chemotherapy started on 06/22/2018   Interval history-he is tolerating his chemotherapy well and denies significant side effects.  He denies fatigue, nausea, vomiting.  Reports occasional cold sensitivity when exposed to cold but none otherwise.  Reports appetite is good.  Weight is stable.  Says he feels well and is eager to proceed with treatment today.  ECOG PS- 0 Pain scale- 0 Opioid associated constipation- no  Review of systems- Review of Systems  Constitutional: Negative for chills, fever, malaise/fatigue and weight loss.  HENT: Negative for congestion, ear discharge and nosebleeds.   Eyes: Negative for blurred vision.  Respiratory: Negative for cough, hemoptysis, sputum production, shortness of breath and wheezing.   Cardiovascular: Negative for chest pain, palpitations, orthopnea and claudication.  Gastrointestinal: Negative for abdominal pain, blood in stool, constipation, diarrhea, heartburn, melena, nausea and vomiting.  Genitourinary: Negative for dysuria, flank pain, frequency, hematuria and urgency.  Musculoskeletal: Negative for back pain, joint  pain and myalgias.  Skin: Negative for rash.  Neurological: Negative for dizziness, tingling, focal weakness, seizures, weakness and headaches.  Endo/Heme/Allergies: Does not bruise/bleed easily.  Psychiatric/Behavioral: Negative for depression and suicidal ideas. The patient does not have insomnia.      No Known Allergies   Past Medical History:  Diagnosis Date  . Cancer ([place])    skin cancer-nose w graft,also shoulder- basal cell per pt  . Cataract    r eye  . Chronic kidney disease    kidney stones 20 y ago per pt  . Colon cancer ([place])   . Diabetes mellitus without complication ([place])   . Eczema   . History of kidney stones   . Hyperlipidemia   . Hypertension   . Vertigo      Past Surgical History:  Procedure Laterality Date  . CATARACT EXTRACTION Left   . COLECTOMY WITH COLOSTOMY CREATION/HARTMANN PROCEDURE N/A 02/06/2017   Procedure: COLECTOMY WITH COLOSTOMY CREATION/HARTMANN PROCEDURE;  Surgeon: [name], MD;  Location: [facility] ORS;  Service: General;  Laterality: N/A;  . COLON SURGERY    . COLONOSCOPY WITH PROPOFOL N/A 09/04/2017   Procedure: COLONOSCOPY WITH PROPOFOL;  Surgeon: Jonathon Bellows, MD;  Location: [facility] ENDOSCOPY;  Service: Gastroenterology;  Laterality: N/A;  . COLOSTOMY CLOSURE N/A 10/20/2017   Procedure: COLOSTOMY CLOSURE;  Surgeon: [name], MD;  Location: [facility] ORS;  Service: General;  Laterality: N/A;  . CYSTOSCOPY    . FLEXIBLE SIGMOIDOSCOPY N/A 02/06/2017   Procedure: FLEXIBLE SIGMOIDOSCOPY;  Surgeon: Christene Lye, MD;  Location: [facility] ENDOSCOPY;  Service: Endoscopy;  Laterality: N/A;  . PORTACATH PLACEMENT N/A 06/17/2018   Procedure: INSERTION PORT-A-CATH, WITH FLUOROSCOPY;  Surgeon: [name], MD;  Location: [facility] ORS;  Service: General;  Laterality: N/A;    Social History   Socioeconomic History  . Marital status: Married    Spouse name: Not on file  . Number of children: Not on file  . Years of education: Not on file    . Highest education level: Not on file  Occupational History  . Not on file  Social Needs  . Financial resource strain: Not on file  . Food insecurity:    Worry: Not on file    Inability: Not on file  . Transportation needs:    Medical: Not on file    Non-medical: Not on file  Tobacco Use  . Smoking status: Never Smoker  . Smokeless tobacco: Never Used  Substance and Sexual Activity  . Alcohol use: No  . Drug use: No  . Sexual activity: Not Currently  Lifestyle  . Physical activity:    Days per week: Not on file    Minutes per session: Not on file  . Stress: Not on file  Relationships  . Social connections:    Talks on phone: Not on file    Gets together: Not on file    Attends religious service: Not on file    Active member of club or organization: Not on file    Attends meetings of clubs or organizations: Not on file    Relationship status: Not on file  . Intimate partner violence:    Fear of current or ex partner: Not on file    Emotionally abused: Not on file    Physically abused: Not on file    Forced sexual activity: Not on file  Other Topics Concern  . Not on file  Social History Narrative  . Not on file  Family History  Problem Relation Age of Onset  . Diabetes Mother   . AAA (abdominal aortic aneurysm) Mother   . Coronary artery disease Mother   . Diabetes Father   . Coronary artery disease Father   . Dementia Father   . Breast cancer Sister      Current Outpatient Medications:  .  aspirin EC 81 MG tablet, Take 81 mg by mouth daily., Disp: , Rfl:  .  Dulaglutide 0.75 MG/0.5ML SOPN, Inject into the skin once a week. , Disp: , Rfl:  .  glimepiride (AMARYL) 2 MG tablet, Take 4 mg by mouth 2 (two) times daily before a meal. , Disp: , Rfl:  .  glimepiride (AMARYL) 4 MG tablet, Take 4 mg by mouth 2 (two) times daily before a meal. , Disp: , Rfl:  .  lidocaine-prilocaine (EMLA) cream, Place a small amount over cream over port 1 hour before each  treatment, cover cream with saran wrap for clothing protection, Disp: 30 g, Rfl: 1 .  loratadine (CLARITIN) 10 MG tablet, Take 10 mg by mouth daily., Disp: , Rfl:  .  losartan (COZAAR) 25 MG tablet, Take 25 mg by mouth daily., Disp: , Rfl:  .  metFORMIN (GLUCOPHAGE) 1000 MG tablet, Take 1,000-1,500 tablets by mouth See admin instructions. TAKE 1 TABLET (1000 MG) BY MOUTH IN THE MORNING & TAKE 1.5 TABLETS (1500 MG) BY MOUTH AT SUPPER, Disp: , Rfl:  .  pravastatin (PRAVACHOL) 20 MG tablet, Take 20 mg by mouth daily with supper. , Disp: , Rfl:  .  triamcinolone (KENALOG) 0.025 % cream, Apply 1 application topically daily as needed (FOR EZCEMA). , Disp: , Rfl:  .  acetaminophen (TYLENOL) 500 MG tablet, Take 500 mg by mouth every 6 (six) hours as needed (for pain.)., Disp: , Rfl:  .  HYDROcodone-Acetaminophen (VICODIN) 5-300 MG TABS, Take 1 tablet by mouth every 4 (four) hours as needed. (Patient not taking: Reported on 10/26/2018), Disp: 15 each, Rfl: 0 .  ondansetron (ZOFRAN) 8 MG tablet, Take 1 tablet (8 mg total) by mouth 2 (two) times daily as needed for refractory nausea / vomiting. Start on day 3 after chemotherapy. (Patient not taking: Reported on 10/26/2018), Disp: 30 tablet, Rfl: 1 .  prochlorperazine (COMPAZINE) 10 MG tablet, Take 1 tablet (10 mg total) by mouth every 6 (six) hours as needed (Nausea or vomiting). (Patient not taking: Reported on 10/26/2018), Disp: 30 tablet, Rfl: 1 No current facility-administered medications for this visit.   Facility-Administered Medications Ordered in Other Visits:  .  0.9 %  sodium chloride infusion, , Intravenous, Continuous, Sindy Guadeloupe, MD, Stopped at 07/27/18 1037 .  0.9 %  sodium chloride infusion, , Intravenous, Continuous, Sindy Guadeloupe, MD, Stopped at 11/09/18 1027 .  fluorouracil (ADRUCIL) 4,850 mg in sodium chloride 0.9 % 53 mL chemo infusion, 2,400 mg/m2 (Treatment Plan Recorded), Intravenous, 1 day or 1 dose, Sindy Guadeloupe, MD .   fluorouracil (ADRUCIL) chemo injection 800 mg, 400 mg/m2 (Treatment Plan Recorded), Intravenous, Once, Sindy Guadeloupe, MD .  heparin lock flush 100 unit/mL, 500 Units, Intravenous, Once, Sindy Guadeloupe, MD .  sodium chloride flush (NS) 0.9 % injection 10 mL, 10 mL, Intravenous, PRN, Sindy Guadeloupe, MD, 10 mL at 07/27/18 0920 .  sodium chloride flush (NS) 0.9 % injection 10 mL, 10 mL, Intravenous, PRN, Sindy Guadeloupe, MD, 10 mL at 11/09/18 0807  Physical exam:  Vitals:   11/09/18 0835  BP: (!) 159/80  Pulse: 71  Resp: 18  Temp: 97.6 F (36.4 C)  TempSrc: Tympanic  Weight: 184 lb 1.6 oz (83.5 kg)  Height: _0  (1.676 m)   Physical Exam HENT:     Head: Normocephalic and atraumatic.  Eyes:     Pupils: Pupils are equal, round, and reactive to light.  Neck:     Musculoskeletal: Normal range of motion.  Cardiovascular:     Rate and Rhythm: Normal rate and regular rhythm.     Heart sounds: Normal heart sounds.  Pulmonary:     Effort: Pulmonary effort is normal.     Breath sounds: Normal breath sounds.  Abdominal:     General: Bowel sounds are normal.     Palpations: Abdomen is soft.  Skin:    General: Skin is warm and dry.  Neurological:     Mental Status: He is alert and oriented to person, place, and time.  Psychiatric:        Mood and Affect: Mood normal.        Behavior: Behavior normal.      CMP Latest Ref Rng & Units 11/09/2018  Glucose 70 - 99 mg/dL 180(H)  BUN 8 - 23 mg/dL 12  Creatinine 0.61 - 1.24 mg/dL 1.00  Sodium 135 - 145 mmol/L 138  Potassium 3.5 - 5.1 mmol/L 3.5  Chloride 98 - 111 mmol/L 107  CO2 22 - 32 mmol/L 21(L)  Calcium 8.9 - 10.3 mg/dL 8.7(L)  Total Protein 6.5 - 8.1 g/dL 6.9  Total Bilirubin 0.3 - 1.2 mg/dL 0.3  Alkaline Phos 38 - 126 U/L 101  AST 15 - 41 U/L 22  ALT 0 - 44 U/L 12   CBC Latest Ref Rng & Units 11/09/2018  WBC 4.0 - 10.5 K/uL 8.9  Hemoglobin 13.0 - 17.0 g/dL 11.8(L)  Hematocrit 39.0 - 52.0 % 37.5(L)  Platelets 150 - 400  K/uL 155      Assessment and plan- Patient is a 77 y.o. male diagnosed with stage IV colon adenocarcinoma with lung metastases who presents to clinic today for treatment assessment prior to cycle 10 of FOLFOX Avastin chemotherapy.  Labs today acceptable to proceed with cycle 10 of FOLFOX Avastin chemotherapy today.  Blood pressure elevated from baseline today but acceptable for treatment.  Urine protein previously negative, today pending.   We will plan for him to return to clinic on 11/11/2018 for pump removal and Udenyca then again on 11/23/2022 labs and consideration of cycle 11 of FOLFOX Avastin chemotherapy.  Per Dr. [name], plan to repeat scans after 12 cycles.    Visit Diagnosis 1. 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[place])      Beckey Rutter, DNP, AGNP-C [place] [facility] [place] (work cell) [phone] (office)  CC: Dr. [name]

## 2018-11-11 ENCOUNTER — Inpatient Hospital Stay: Payer: Medicare Other | Attending: Oncology

## 2018-11-11 VITALS — BP 164/67 | HR 64 | Resp 18

## 2018-11-11 DIAGNOSIS — C7800 Secondary malignant neoplasm of unspecified lung: Secondary | ICD-10-CM | POA: Diagnosis not present

## 2018-11-11 DIAGNOSIS — C189 Malignant neoplasm of colon, unspecified: Secondary | ICD-10-CM

## 2018-11-11 DIAGNOSIS — C187 Malignant neoplasm of sigmoid colon: Secondary | ICD-10-CM | POA: Insufficient documentation

## 2018-11-11 DIAGNOSIS — Z5189 Encounter for other specified aftercare: Secondary | ICD-10-CM | POA: Insufficient documentation

## 2018-11-11 DIAGNOSIS — Z5112 Encounter for antineoplastic immunotherapy: Secondary | ICD-10-CM | POA: Diagnosis present

## 2018-11-11 DIAGNOSIS — Z5111 Encounter for antineoplastic chemotherapy: Secondary | ICD-10-CM | POA: Diagnosis not present

## 2018-11-11 MED ORDER — HEPARIN SOD (PORK) LOCK FLUSH 100 UNIT/ML IV SOLN
500.0000 [IU] | Freq: Once | INTRAVENOUS | Status: AC | PRN
  Administered 2018-11-11: 500 [IU]

## 2018-11-11 MED ORDER — PEGFILGRASTIM-CBQV 6 MG/0.6ML ~~LOC~~ SOSY
6.0000 mg | PREFILLED_SYRINGE | Freq: Once | SUBCUTANEOUS | Status: AC
  Administered 2018-11-11: 6 mg via SUBCUTANEOUS
  Filled 2018-11-11: qty 0.6

## 2018-11-23 ENCOUNTER — Inpatient Hospital Stay: Payer: Medicare Other

## 2018-11-23 ENCOUNTER — Encounter: Payer: Self-pay | Admitting: Oncology

## 2018-11-23 ENCOUNTER — Inpatient Hospital Stay (HOSPITAL_BASED_OUTPATIENT_CLINIC_OR_DEPARTMENT_OTHER): Payer: Medicare Other | Admitting: Oncology

## 2018-11-23 VITALS — BP 154/77 | HR 73 | Temp 96.7°F | Resp 16 | Wt 180.0 lb

## 2018-11-23 DIAGNOSIS — C7802 Secondary malignant neoplasm of left lung: Secondary | ICD-10-CM | POA: Diagnosis not present

## 2018-11-23 DIAGNOSIS — C189 Malignant neoplasm of colon, unspecified: Secondary | ICD-10-CM

## 2018-11-23 DIAGNOSIS — C7800 Secondary malignant neoplasm of unspecified lung: Secondary | ICD-10-CM

## 2018-11-23 DIAGNOSIS — E119 Type 2 diabetes mellitus without complications: Secondary | ICD-10-CM | POA: Diagnosis not present

## 2018-11-23 DIAGNOSIS — Z803 Family history of malignant neoplasm of breast: Secondary | ICD-10-CM

## 2018-11-23 DIAGNOSIS — C7801 Secondary malignant neoplasm of right lung: Secondary | ICD-10-CM | POA: Diagnosis not present

## 2018-11-23 DIAGNOSIS — Z5112 Encounter for antineoplastic immunotherapy: Secondary | ICD-10-CM | POA: Diagnosis not present

## 2018-11-23 DIAGNOSIS — C187 Malignant neoplasm of sigmoid colon: Secondary | ICD-10-CM | POA: Diagnosis not present

## 2018-11-23 DIAGNOSIS — Z5111 Encounter for antineoplastic chemotherapy: Secondary | ICD-10-CM

## 2018-11-23 DIAGNOSIS — I10 Essential (primary) hypertension: Secondary | ICD-10-CM

## 2018-11-23 LAB — CBC WITH DIFFERENTIAL/PLATELET
Abs Immature Granulocytes: 0.07 10*3/uL (ref 0.00–0.07)
Basophils Absolute: 0 10*3/uL (ref 0.0–0.1)
Basophils Relative: 1 %
Eosinophils Absolute: 0.1 10*3/uL (ref 0.0–0.5)
Eosinophils Relative: 2 %
HCT: 38.9 % — ABNORMAL LOW (ref 39.0–52.0)
HEMOGLOBIN: 12.4 g/dL — AB (ref 13.0–17.0)
Immature Granulocytes: 1 %
LYMPHS PCT: 30 %
Lymphs Abs: 2.4 10*3/uL (ref 0.7–4.0)
MCH: 29.7 pg (ref 26.0–34.0)
MCHC: 31.9 g/dL (ref 30.0–36.0)
MCV: 93.1 fL (ref 80.0–100.0)
Monocytes Absolute: 0.6 10*3/uL (ref 0.1–1.0)
Monocytes Relative: 8 %
NEUTROS PCT: 58 %
Neutro Abs: 4.7 10*3/uL (ref 1.7–7.7)
Platelets: 144 10*3/uL — ABNORMAL LOW (ref 150–400)
RBC: 4.18 MIL/uL — ABNORMAL LOW (ref 4.22–5.81)
RDW: 15.6 % — ABNORMAL HIGH (ref 11.5–15.5)
WBC: 8 10*3/uL (ref 4.0–10.5)
nRBC: 0 % (ref 0.0–0.2)

## 2018-11-23 LAB — COMPREHENSIVE METABOLIC PANEL
ALT: 15 U/L (ref 0–44)
AST: 21 U/L (ref 15–41)
Albumin: 4.2 g/dL (ref 3.5–5.0)
Alkaline Phosphatase: 96 U/L (ref 38–126)
Anion gap: 9 (ref 5–15)
BUN: 14 mg/dL (ref 8–23)
CO2: 23 mmol/L (ref 22–32)
Calcium: 9.4 mg/dL (ref 8.9–10.3)
Chloride: 107 mmol/L (ref 98–111)
Creatinine, Ser: 1.08 mg/dL (ref 0.61–1.24)
GFR calc Af Amer: >60 mL/min (ref >60)
GFR calc non Af Amer: >60 mL/min (ref >60)
Glucose, Bld: 204 mg/dL — ABNORMAL HIGH (ref 70–99)
Potassium: 3.7 mmol/L (ref 3.5–5.1)
Sodium: 139 mmol/L (ref 135–145)
Total Bilirubin: 0.5 mg/dL (ref 0.3–1.2)
Total Protein: 7.1 g/dL (ref 6.5–8.1)

## 2018-11-23 MED ORDER — SODIUM CHLORIDE 0.9 % IV SOLN
5.0000 mg/kg | Freq: Once | INTRAVENOUS | Status: AC
  Administered 2018-11-23: 450 mg via INTRAVENOUS
  Filled 2018-11-23: qty 18

## 2018-11-23 MED ORDER — OXALIPLATIN CHEMO INJECTION 100 MG/20ML
65.0000 mg/m2 | Freq: Once | INTRAVENOUS | Status: AC
  Administered 2018-11-23: 130 mg via INTRAVENOUS
  Filled 2018-11-23: qty 26

## 2018-11-23 MED ORDER — SODIUM CHLORIDE 0.9 % IV SOLN
2400.0000 mg/m2 | INTRAVENOUS | Status: DC
  Administered 2018-11-23: 4850 mg via INTRAVENOUS
  Filled 2018-11-23: qty 97

## 2018-11-23 MED ORDER — FLUOROURACIL CHEMO INJECTION 2.5 GM/50ML
400.0000 mg/m2 | Freq: Once | INTRAVENOUS | Status: AC
  Administered 2018-11-23: 800 mg via INTRAVENOUS
  Filled 2018-11-23: qty 16

## 2018-11-23 MED ORDER — PALONOSETRON HCL INJECTION 0.25 MG/5ML
0.2500 mg | Freq: Once | INTRAVENOUS | Status: AC
  Administered 2018-11-23: 0.25 mg via INTRAVENOUS
  Filled 2018-11-23: qty 5

## 2018-11-23 MED ORDER — LEUCOVORIN CALCIUM INJECTION 350 MG
800.0000 mg | Freq: Once | INTRAVENOUS | Status: AC
  Administered 2018-11-23: 800 mg via INTRAVENOUS
  Filled 2018-11-23: qty 25

## 2018-11-23 MED ORDER — SODIUM CHLORIDE 0.9% FLUSH
10.0000 mL | INTRAVENOUS | Status: DC | PRN
  Administered 2018-11-23: 10 mL via INTRAVENOUS
  Filled 2018-11-23: qty 10

## 2018-11-23 MED ORDER — DEXAMETHASONE SODIUM PHOSPHATE 10 MG/ML IJ SOLN
10.0000 mg | Freq: Once | INTRAMUSCULAR | Status: AC
  Administered 2018-11-23: 10 mg via INTRAVENOUS
  Filled 2018-11-23: qty 1

## 2018-11-23 MED ORDER — DEXTROSE 5 % IV SOLN
Freq: Once | INTRAVENOUS | Status: AC
  Administered 2018-11-23: 09:00:00 via INTRAVENOUS
  Filled 2018-11-23: qty 250

## 2018-11-23 MED ORDER — HEPARIN SOD (PORK) LOCK FLUSH 100 UNIT/ML IV SOLN
500.0000 [IU] | Freq: Once | INTRAVENOUS | Status: DC
  Filled 2018-11-23: qty 5

## 2018-11-23 NOTE — Progress Notes (Signed)
Patient here for his chemotherapy. Today, appetite is down about 50%. Losing his taste. Denies pain.Normal bowels movements with occ stool softener.

## 2018-11-23 NOTE — Progress Notes (Signed)
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 Patient is a 78 year old male who presented with evidence of bowel obstruction on 02/06/2017. At that time he had progressive abdominal distention and no bowel movement for about one week. CT abdomen showed an apple core lesion in the descending/sigmoid colon.  2. Patient underwent Hartmann's procedure for obstructing sigmoid colon mass on 02/06/2017. Preoperative CEA was 5.0  3. 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 It is 0 out of 15 lymph nodes were positive for malignancy. Perineural and lymphovascular invasion was present. Margins were negative.pT3N0. MMR stable.  4. Given that he had high risk stage II colon cancer including he presented with obstruction and has LVI and perineural invasion- adjuvant xeloda chemotherapy was recommended for 6 months.   5.Patient had evidence of hand foot syndrome after cycle 3. Cycle 4 delayed by 1 week. Topical urea cream prescribed  6. Patient completed 8 cycles of adjuvant xeloda in October 2018. polps seen in sigmoid and decending colon but negative for malignancy  7. Repeat Ct abdomen after 8 cycles showed no metastatic disease. 6 mm lung nodule noted in RLL  8.Patient had a repeat colonoscopy in October 2018 which showed polyps in his transverse and descending colon which were taken out and were negative for  high-grade dysplasia or malignancy. Patient subsequently underwent colostomy takedown procedure by Dr. Burt Knack  9. Repeat CT chest abdomen/ pelvis showed increase in the size of previously seen lung nodules largest being 1.4 cm consistent with metastatic disease. PET/CT showed mild uptake in 2 lung nodules. Others were not hypermetabolic. No other evidence of metastatic disease  10. Repeat lung biopsy consistent with colon adenocarcinoma. Comprehensive RAS panel testing showed mutant KRAS  11.Given the slow rate of growth of his colon cancer and desire to maintain his quality of life plan was to start chemotherapy after he gets a 15-monthbreak in summer. Repeat CT chest abdomen and pelvis in August 2019 showed mild increase in the size of his lung nodules. No new lung nodules are no new sites of distant metastatic disease.FOLFOX Avastin chemotherapy started on 06/22/2018    Interval history-he is overall tolerating his chemotherapy well.  His appetite is good and he denies any nausea or vomiting.  Denies any leg swelling.  Reports that his blood sugars have been mainly between 130s to 160s at home.  Blood pressure has been mostly running in the 140s at home.  Reports mild tingling numbness in his toes sometimes in the afternoon but it has not persisted.  ECOG PS- 0 Pain scale- 0   Review of systems- Review of Systems  Constitutional: Negative for chills, fever, malaise/fatigue and weight loss.  HENT: Negative for congestion, ear discharge and nosebleeds.   Eyes: Negative for blurred vision.  Respiratory: Negative for cough, hemoptysis, sputum production, shortness of breath and wheezing.   Cardiovascular: Negative for chest pain, palpitations, orthopnea and claudication.  Gastrointestinal: Negative for abdominal pain, blood in stool, constipation, diarrhea, heartburn, melena, nausea and vomiting.  Genitourinary: Negative for dysuria, flank pain, frequency, hematuria and urgency.    Musculoskeletal: Negative for back pain, joint pain and myalgias.  Skin: Negative for rash.  Neurological: Positive for sensory change (Peripheral neuropathy). Negative for dizziness, tingling, focal weakness, seizures, weakness and headaches.  Endo/Heme/Allergies: Does not bruise/bleed easily.  Psychiatric/Behavioral: Negative for depression and suicidal ideas. The patient does not have insomnia.        No Known Allergies   Past Medical History:  Diagnosis Date  . Cancer ([place])    skin cancer-nose w graft,also shoulder- basal cell per pt  . Cataract    r eye  . Chronic kidney disease    kidney stones 20 y ago per pt  . Colon cancer ([place])   . Diabetes mellitus without complication ([place])   . Eczema   . History of kidney stones   . Hyperlipidemia   . Hypertension   . Vertigo      Past Surgical History:  Procedure Laterality Date  . CATARACT EXTRACTION Left   . COLECTOMY WITH COLOSTOMY CREATION/HARTMANN PROCEDURE N/A 02/06/2017   Procedure: COLECTOMY WITH COLOSTOMY CREATION/HARTMANN PROCEDURE;  Surgeon: [name], MD;  Location: [facility] ORS;  Service: General;  Laterality: N/A;  . COLON SURGERY    . COLONOSCOPY WITH PROPOFOL N/A 09/04/2017   Procedure: COLONOSCOPY WITH PROPOFOL;  Surgeon: Jonathon Bellows, MD;  [facility] END[facility]COPY;  Service: Gastroenterology;  Laterality: N/A;  . COLOSTOMY CLOSURE N/A 10/20/2017   Procedure: COLOSTOMY CLOSURE;  Surgeon: [name], MD;  Location: [facility] ORS;  Service: General;  Laterality: N/A;  . CYSTOSCOPY    . FLEXIBLE SIGMOIDOSCOPY N/A 02/06/2017   Procedure: FLEXIBLE SIGMOIDOSCOPY;  Surgeon: Christene Lye, MD;  Location: [facility] ENDOSCOPY;  Service: Endoscopy;  Laterality: N/A;  . PORTACATH PLACEMENT N/A 06/17/2018   Procedure: INSERTION PORT-A-CATH, WITH FLUOROSCOPY;  Surgeon: [name], MD;  Location: [facility] ORS;  Service: General;  Laterality: N/A;    Social History   Socioeconomic History  . Marital status:  Married    Spouse name: Not on file  . Number of children: Not on file  . Years of education: Not on file  . Highest education level: Not on file  Occupational History  . Not on file  Social Needs  . Financial resource strain: Not on file  . Food insecurity:    Worry: Not on file    Inability: Not on file  . Transportation needs:    Medical: Not on file    Non-medical: Not on file  Tobacco Use  . Smoking status: Never Smoker  . Smokeless tobacco: Never Used  Substance and Sexual Activity  . Alcohol use: No  . Drug use: No  . Sexual activity: Not Currently  Lifestyle  . Physical activity:    Days per week: Not on file    Minutes per session: Not on file  . Stress: Not on file  Relationships  . Social connections:    Talks on phone: Not on file    Gets together: Not on file    Attends religious service: Not on file    Active member of club or organization: Not on file    Attends meetings of clubs or organizations: Not on file    Relationship status: Not on file  . Intimate partner violence:    Fear of current or ex partner: Not on file    Emotionally abused: Not on file    Physically abused: Not on file    Forced  sexual activity: Not on file  Other Topics Concern  . Not on file  Social History Narrative  . Not on file    Family History  Problem Relation Age of Onset  . Diabetes Mother   . AAA (abdominal aortic aneurysm) Mother   . Coronary artery disease Mother   . Diabetes Father   . Coronary artery disease Father   . Dementia Father   . Breast cancer Sister      Current Outpatient Medications:  .  acetaminophen (TYLENOL) 500 MG tablet, Take 500 mg by mouth every 6 (six) hours as needed (for pain.)., Disp: , Rfl:  .  aspirin EC 81 MG tablet, Take 81 mg by mouth daily., Disp: , Rfl:  .  Dulaglutide 0.75 MG/0.5ML SOPN, Inject into the skin once a week. , Disp: , Rfl:  .  glimepiride (AMARYL) 2 MG tablet, Take 4 mg by mouth 2 (two) times daily before a meal.  , Disp: , Rfl:  .  glimepiride (AMARYL) 4 MG tablet, Take 4 mg by mouth 2 (two) times daily before a meal. , Disp: , Rfl:  .  HYDROcodone-Acetaminophen (VICODIN) 5-300 MG TABS, Take 1 tablet by mouth every 4 (four) hours as needed. (Patient not taking: Reported on 10/26/2018), Disp: 15 each, Rfl: 0 .  lidocaine-prilocaine (EMLA) cream, Place a small amount over cream over port 1 hour before each treatment, cover cream with saran wrap for clothing protection, Disp: 30 g, Rfl: 1 .  loratadine (CLARITIN) 10 MG tablet, Take 10 mg by mouth daily., Disp: , Rfl:  .  losartan (COZAAR) 25 MG tablet, Take 25 mg by mouth daily., Disp: , Rfl:  .  metFORMIN (GLUCOPHAGE) 1000 MG tablet, Take 1,000-1,500 tablets by mouth See admin instructions. TAKE 1 TABLET (1000 MG) BY MOUTH IN THE MORNING & TAKE 1.5 TABLETS (1500 MG) BY MOUTH AT SUPPER, Disp: , Rfl:  .  ondansetron (ZOFRAN) 8 MG tablet, Take 1 tablet (8 mg total) by mouth 2 (two) times daily as needed for refractory nausea / vomiting. Start on day 3 after chemotherapy. (Patient not taking: Reported on 10/26/2018), Disp: 30 tablet, Rfl: 1 .  pravastatin (PRAVACHOL) 20 MG tablet, Take 20 mg by mouth daily with supper. , Disp: , Rfl:  .  prochlorperazine (COMPAZINE) 10 MG tablet, Take 1 tablet (10 mg total) by mouth every 6 (six) hours as needed (Nausea or vomiting). (Patient not taking: Reported on 10/26/2018), Disp: 30 tablet, Rfl: 1 .  triamcinolone (KENALOG) 0.025 % cream, Apply 1 application topically daily as needed (FOR EZCEMA). , Disp: , Rfl:  No current facility-administered medications for this visit.   Facility-Administered Medications Ordered in Other Visits:  .  0.9 %  sodium chloride infusion, , Intravenous, Continuous, Sindy Guadeloupe, MD, Stopped at 07/27/18 1037 .  heparin lock flush 100 unit/mL, 500 Units, Intravenous, Once, Sindy Guadeloupe, MD .  heparin lock flush 100 unit/mL, 500 Units, Intravenous, Once, Sindy Guadeloupe, MD .  sodium chloride  flush (NS) 0.9 % injection 10 mL, 10 mL, Intravenous, PRN, Sindy Guadeloupe, MD, 10 mL at 07/27/18 0920 .  sodium chloride flush (NS) 0.9 % injection 10 mL, 10 mL, Intravenous, PRN, Sindy Guadeloupe, MD  Physical exam:  Vitals:   11/23/18 0822  BP: (!) 154/77  Pulse: 73  Resp: 16  Temp: (!) 96.7 F (35.9 C)  TempSrc: Tympanic  Weight: 180 lb (81.6 kg)   Physical Exam HENT:     Head: Normocephalic  and atraumatic.  Eyes:     Pupils: Pupils are equal, round, and reactive to light.  Neck:     Musculoskeletal: Normal range of motion.  Cardiovascular:     Rate and Rhythm: Normal rate and regular rhythm.     Heart sounds: Normal heart sounds.  Pulmonary:     Effort: Pulmonary effort is normal.     Breath sounds: Normal breath sounds.  Abdominal:     General: Bowel sounds are normal.     Palpations: Abdomen is soft.  Musculoskeletal:     Right lower leg: No edema.     Left lower leg: No edema.  Skin:    General: Skin is warm and dry.  Neurological:     Mental Status: He is alert and oriented to person, place, and time.      CMP Latest Ref Rng & Units 11/23/2018  Glucose 70 - 99 mg/dL 204(H)  BUN 8 - 23 mg/dL 14  Creatinine 0.61 - 1.24 mg/dL 1.08  Sodium 135 - 145 mmol/L 139  Potassium 3.5 - 5.1 mmol/L 3.7  Chloride 98 - 111 mmol/L 107  CO2 22 - 32 mmol/L 23  Calcium 8.9 - 10.3 mg/dL 9.4  Total Protein 6.5 - 8.1 g/dL 7.1  Total Bilirubin 0.3 - 1.2 mg/dL 0.5  Alkaline Phos 38 - 126 U/L 96  AST 15 - 41 U/L 21  ALT 0 - 44 U/L 15   CBC Latest Ref Rng & Units 11/23/2018  WBC 4.0 - 10.5 K/uL 8.0  Hemoglobin 13.0 - 17.0 g/dL 12.4(L)  Hematocrit 39.0 - 52.0 % 38.9(L)  Platelets 150 - 400 K/uL 144(L)      Assessment and plan- Patient is a 78 y.o. male stage IV colon adenocarcinoma with lung metastases.  He is here for on treatment assessment prior to cycle 11 of FOLFOX Avastin chemotherapy  Counts okay to proceed with cycle 11 of FOLFOX Avastin chemotherapy today.  He will  come for pump disconnect on day 3.  I will see him back in 2 weeks time for cycle 12.  Plan to repeat scans after cycle 12   Visit Diagnosis 1. Encounter for antineoplastic chemotherapy   2. Malignant neoplasm metastatic to lung, unspecified laterality ([place])   3. 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[place])      Dr. Randa Evens, MD, MPH [facility] [phone] 11/23/2018 8:39 AM

## 2018-11-24 LAB — PROTEIN ELECTRO, RANDOM URINE
Albumin ELP, Urine: 26 %
Alpha-1-Globulin, U: 3 %
Alpha-2-Globulin, U: 15.4 %
Beta Globulin, U: 27.5 %
GAMMA GLOBULIN, U: 28.1 %
Total Protein, Urine: 27 mg/dL

## 2018-11-25 ENCOUNTER — Inpatient Hospital Stay: Payer: Medicare Other

## 2018-11-25 VITALS — BP 151/63 | HR 76 | Temp 98.0°F | Resp 18

## 2018-11-25 DIAGNOSIS — C7800 Secondary malignant neoplasm of unspecified lung: Secondary | ICD-10-CM

## 2018-11-25 DIAGNOSIS — Z5112 Encounter for antineoplastic immunotherapy: Secondary | ICD-10-CM | POA: Diagnosis not present

## 2018-11-25 DIAGNOSIS — C189 Malignant neoplasm of colon, unspecified: Secondary | ICD-10-CM

## 2018-11-25 MED ORDER — HEPARIN SOD (PORK) LOCK FLUSH 100 UNIT/ML IV SOLN
500.0000 [IU] | Freq: Once | INTRAVENOUS | Status: AC | PRN
  Administered 2018-11-25: 500 [IU]
  Filled 2018-11-25: qty 5

## 2018-11-25 MED ORDER — PEGFILGRASTIM-CBQV 6 MG/0.6ML ~~LOC~~ SOSY
6.0000 mg | PREFILLED_SYRINGE | Freq: Once | SUBCUTANEOUS | Status: AC
  Administered 2018-11-25: 6 mg via SUBCUTANEOUS

## 2018-12-07 ENCOUNTER — Inpatient Hospital Stay (HOSPITAL_BASED_OUTPATIENT_CLINIC_OR_DEPARTMENT_OTHER): Payer: Medicare Other | Admitting: Oncology

## 2018-12-07 ENCOUNTER — Encounter: Payer: Self-pay | Admitting: Oncology

## 2018-12-07 ENCOUNTER — Inpatient Hospital Stay: Payer: Medicare Other

## 2018-12-07 VITALS — BP 161/75 | HR 74 | Temp 97.2°F | Resp 18 | Wt 180.0 lb

## 2018-12-07 DIAGNOSIS — C187 Malignant neoplasm of sigmoid colon: Secondary | ICD-10-CM

## 2018-12-07 DIAGNOSIS — Z5111 Encounter for antineoplastic chemotherapy: Secondary | ICD-10-CM

## 2018-12-07 DIAGNOSIS — C189 Malignant neoplasm of colon, unspecified: Secondary | ICD-10-CM

## 2018-12-07 DIAGNOSIS — C7800 Secondary malignant neoplasm of unspecified lung: Secondary | ICD-10-CM

## 2018-12-07 DIAGNOSIS — Z5112 Encounter for antineoplastic immunotherapy: Secondary | ICD-10-CM | POA: Diagnosis not present

## 2018-12-07 LAB — CBC WITH DIFFERENTIAL/PLATELET
Abs Immature Granulocytes: 0.09 10*3/uL — ABNORMAL HIGH (ref 0.00–0.07)
Basophils Absolute: 0 10*3/uL (ref 0.0–0.1)
Basophils Relative: 1 %
Eosinophils Absolute: 0.1 10*3/uL (ref 0.0–0.5)
Eosinophils Relative: 1 %
HCT: 38.1 % — ABNORMAL LOW (ref 39.0–52.0)
HEMOGLOBIN: 11.9 g/dL — AB (ref 13.0–17.0)
Immature Granulocytes: 1 %
LYMPHS PCT: 28 %
Lymphs Abs: 2.4 10*3/uL (ref 0.7–4.0)
MCH: 29.2 pg (ref 26.0–34.0)
MCHC: 31.2 g/dL (ref 30.0–36.0)
MCV: 93.4 fL (ref 80.0–100.0)
Monocytes Absolute: 0.8 10*3/uL (ref 0.1–1.0)
Monocytes Relative: 9 %
Neutro Abs: 5.1 10*3/uL (ref 1.7–7.7)
Neutrophils Relative %: 60 %
Platelets: 116 10*3/uL — ABNORMAL LOW (ref 150–400)
RBC: 4.08 MIL/uL — ABNORMAL LOW (ref 4.22–5.81)
RDW: 15.3 % (ref 11.5–15.5)
WBC: 8.5 10*3/uL (ref 4.0–10.5)
nRBC: 0 % (ref 0.0–0.2)

## 2018-12-07 LAB — COMPREHENSIVE METABOLIC PANEL
ALT: 14 U/L (ref 0–44)
ANION GAP: 9 (ref 5–15)
AST: 22 U/L (ref 15–41)
Albumin: 4 g/dL (ref 3.5–5.0)
Alkaline Phosphatase: 85 U/L (ref 38–126)
BUN: 11 mg/dL (ref 8–23)
CO2: 21 mmol/L — ABNORMAL LOW (ref 22–32)
Calcium: 8.8 mg/dL — ABNORMAL LOW (ref 8.9–10.3)
Chloride: 108 mmol/L (ref 98–111)
Creatinine, Ser: 0.96 mg/dL (ref 0.61–1.24)
GFR calc Af Amer: >60 mL/min (ref >60)
GFR calc non Af Amer: >60 mL/min (ref >60)
GLUCOSE: 207 mg/dL — AB (ref 70–99)
Potassium: 3.6 mmol/L (ref 3.5–5.1)
Sodium: 138 mmol/L (ref 135–145)
Total Bilirubin: 0.5 mg/dL (ref 0.3–1.2)
Total Protein: 7 g/dL (ref 6.5–8.1)

## 2018-12-07 LAB — PROTEIN, URINE, RANDOM: TOTAL PROTEIN, URINE: 44 mg/dL

## 2018-12-07 MED ORDER — LEUCOVORIN CALCIUM INJECTION 350 MG
800.0000 mg | Freq: Once | INTRAVENOUS | Status: AC
  Administered 2018-12-07: 800 mg via INTRAVENOUS
  Filled 2018-12-07: qty 40

## 2018-12-07 MED ORDER — PALONOSETRON HCL INJECTION 0.25 MG/5ML
0.2500 mg | Freq: Once | INTRAVENOUS | Status: AC
  Administered 2018-12-07: 0.25 mg via INTRAVENOUS
  Filled 2018-12-07: qty 5

## 2018-12-07 MED ORDER — DEXTROSE 5 % IV SOLN
Freq: Once | INTRAVENOUS | Status: AC
  Administered 2018-12-07: 11:00:00 via INTRAVENOUS
  Filled 2018-12-07: qty 250

## 2018-12-07 MED ORDER — SODIUM CHLORIDE 0.9 % IV SOLN
2400.0000 mg/m2 | INTRAVENOUS | Status: DC
  Administered 2018-12-07: 4850 mg via INTRAVENOUS
  Filled 2018-12-07: qty 97

## 2018-12-07 MED ORDER — DEXAMETHASONE SODIUM PHOSPHATE 10 MG/ML IJ SOLN
10.0000 mg | Freq: Once | INTRAMUSCULAR | Status: AC
  Administered 2018-12-07: 10 mg via INTRAVENOUS
  Filled 2018-12-07: qty 1

## 2018-12-07 MED ORDER — FLUOROURACIL CHEMO INJECTION 2.5 GM/50ML
400.0000 mg/m2 | Freq: Once | INTRAVENOUS | Status: AC
  Administered 2018-12-07: 800 mg via INTRAVENOUS
  Filled 2018-12-07: qty 16

## 2018-12-07 MED ORDER — SODIUM CHLORIDE 0.9 % IV SOLN
INTRAVENOUS | Status: DC
  Administered 2018-12-07: 10:00:00 via INTRAVENOUS
  Filled 2018-12-07: qty 250

## 2018-12-07 MED ORDER — OXALIPLATIN CHEMO INJECTION 100 MG/20ML
65.0000 mg/m2 | Freq: Once | INTRAVENOUS | Status: AC
  Administered 2018-12-07: 130 mg via INTRAVENOUS
  Filled 2018-12-07: qty 20

## 2018-12-07 MED ORDER — SODIUM CHLORIDE 0.9% FLUSH
10.0000 mL | INTRAVENOUS | Status: DC | PRN
  Administered 2018-12-07: 10 mL
  Filled 2018-12-07: qty 10

## 2018-12-07 MED ORDER — SODIUM CHLORIDE 0.9 % IV SOLN
5.0000 mg/kg | Freq: Once | INTRAVENOUS | Status: AC
  Administered 2018-12-07: 450 mg via INTRAVENOUS
  Filled 2018-12-07: qty 16

## 2018-12-07 NOTE — Progress Notes (Signed)
Patient here for cycle 12 of chemotherapy. Doing well over all. No complaints today.

## 2018-12-09 ENCOUNTER — Inpatient Hospital Stay: Payer: Medicare Other

## 2018-12-09 VITALS — Temp 97.3°F

## 2018-12-09 DIAGNOSIS — C189 Malignant neoplasm of colon, unspecified: Secondary | ICD-10-CM

## 2018-12-09 DIAGNOSIS — Z5112 Encounter for antineoplastic immunotherapy: Secondary | ICD-10-CM | POA: Diagnosis not present

## 2018-12-09 DIAGNOSIS — C7800 Secondary malignant neoplasm of unspecified lung: Secondary | ICD-10-CM

## 2018-12-09 MED ORDER — SODIUM CHLORIDE 0.9% FLUSH
10.0000 mL | INTRAVENOUS | Status: DC | PRN
  Administered 2018-12-09: 10 mL
  Filled 2018-12-09: qty 10

## 2018-12-09 MED ORDER — PEGFILGRASTIM-CBQV 6 MG/0.6ML ~~LOC~~ SOSY
6.0000 mg | PREFILLED_SYRINGE | Freq: Once | SUBCUTANEOUS | Status: AC
  Administered 2018-12-09: 6 mg via SUBCUTANEOUS
  Filled 2018-12-09: qty 0.6

## 2018-12-09 MED ORDER — HEPARIN SOD (PORK) LOCK FLUSH 100 UNIT/ML IV SOLN
500.0000 [IU] | Freq: Once | INTRAVENOUS | Status: AC | PRN
  Administered 2018-12-09: 500 [IU]
  Filled 2018-12-09: qty 5

## 2018-12-09 NOTE — Progress Notes (Signed)
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 Patient is a 78 year old male who presented with evidence of bowel obstruction on 02/06/2017. At that time he had progressive abdominal distention and no bowel movement for about one week. CT abdomen showed an apple core lesion in the descending/sigmoid colon.  2. Patient underwent Hartmann's procedure for obstructing sigmoid colon mass on 02/06/2017. Preoperative CEA was 5.0  3. 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 It is 0 out of 15 lymph nodes were positive for malignancy. Perineural and lymphovascular invasion was present. Margins were negative.pT3N0. MMR stable.  4. Given that he had high risk stage II colon cancer including he presented with obstruction and has LVI and perineural invasion- adjuvant xeloda chemotherapy was recommended for 6 months.   5.Patient had evidence of hand foot syndrome after cycle 3. Cycle 4 delayed by 1 week. Topical urea cream prescribed  6. Patient completed 8 cycles of adjuvant xeloda in October 2018. polps seen in sigmoid and decending colon but negative for malignancy  7. Repeat Ct abdomen after 8 cycles showed no metastatic disease. 6 mm lung nodule noted in RLL  8.Patient had a repeat colonoscopy in October 2018 which showed polyps in his transverse and descending colon which were taken out and were negative for  high-grade dysplasia or malignancy. Patient subsequently underwent colostomy takedown procedure by Dr. Burt Knack  9. Repeat CT chest abdomen/ pelvis showed increase in the size of previously seen lung nodules largest being 1.4 cm consistent with metastatic disease. PET/CT showed mild uptake in 2 lung nodules. Others were not hypermetabolic. No other evidence of metastatic disease  10. Repeat lung biopsy consistent with colon adenocarcinoma. Comprehensive RAS panel testing showed mutant KRAS  11.Given the slow rate of growth of his colon cancer and desire to maintain his quality of life plan was to start chemotherapy after he gets a 18-monthbreak in summer. Repeat CT chest abdomen and pelvis in August 2019 showed mild increase in the size of his lung nodules. No new lung nodules are no new sites of distant metastatic disease.FOLFOX Avastin chemotherapy started on 06/22/2018    Interval history-patient has tolerated chemotherapy well so far without any significant nausea or vomiting.  He has mild intermittent tingling sensation in his fingers which comes and goes but has not progressively worsened..Marland Kitchen He continues to have an active life and often goes to the beach especially in summers.  He desires a treatment break after 12 treatments  ECOG PS- 0 Pain scale- 0   Review of systems- Review of Systems  Constitutional: Negative for chills, fever, malaise/fatigue and weight loss.  HENT: Negative for congestion, ear discharge and nosebleeds.   Eyes: Negative for blurred vision.  Respiratory: Negative for cough, hemoptysis, sputum production, shortness of breath and wheezing.   Cardiovascular: Negative for chest pain, palpitations, orthopnea and claudication.  Gastrointestinal: Negative for abdominal pain, blood in stool, constipation, diarrhea, heartburn, melena, nausea and vomiting.  Genitourinary: Negative for dysuria, flank pain, frequency, hematuria and urgency.  Musculoskeletal: Negative  for back pain, joint pain and myalgias.  Skin: Negative for rash.  Neurological: Positive for sensory change (Peripheral neuropathy). Negative for dizziness, tingling, focal weakness, seizures, weakness and headaches.  Endo/Heme/Allergies: Does not bruise/bleed easily.  Psychiatric/Behavioral: Negative for depression and suicidal ideas. The patient does not have insomnia.       No Known Allergies   Past Medical History:  Diagnosis Date  . Cancer ([place])    skin cancer-nose w graft,also shoulder- basal cell per pt  . Cataract    r eye  . Chronic kidney disease    kidney stones 20 y ago per pt  . Colon cancer ([place])   . Diabetes mellitus without complication ([place])   . Eczema   . History of kidney stones   . Hyperlipidemia   . Hypertension   . Vertigo      Past Surgical History:  Procedure Laterality Date  . CATARACT EXTRACTION Left   . COLECTOMY WITH COLOSTOMY CREATION/HARTMANN PROCEDURE N/A 02/06/2017   Procedure: COLECTOMY WITH COLOSTOMY CREATION/HARTMANN PROCEDURE;  Surgeon: [name], MD;  Location: [facility] ORS;  Service: General;  Laterality: N/A;  . COLON SURGERY    . COLONOSCOPY WITH PROPOFOL N/A 09/04/2017   Procedure: COLONOSCOPY WITH PROPOFOL;  Surgeon: Jonathon Bellows, MD;  Location: [facility]COPY;  Service: Gastroenterology;  Laterality: N/A;  . COLOSTOMY CLOSURE N/A 10/20/2017   Procedure: COLOSTOMY CLOSURE;  Surgeon: [name], MD;  Location: [facility] ORS;  Service: General;  Laterality: N/A;  . CYSTOSCOPY    . FLEXIBLE SIGMOIDOSCOPY N/A 02/06/2017   Procedure: FLEXIBLE SIGMOIDOSCOPY;  Surgeon: Christene Lye, MD;  Location: [facility] ENDOSCOPY;  Service: Endoscopy;  Laterality: N/A;  . PORTACATH PLACEMENT N/A 06/17/2018   Procedure: INSERTION PORT-A-CATH, WITH FLUOROSCOPY;  Surgeon: [name], MD;  Location: [facility] ORS;  Service: General;  Laterality: N/A;    Social History   Socioeconomic History  . Marital status: Married    Spouse name: Not on  file  . Number of children: Not on file  . Years of education: Not on file  . Highest education level: Not on file  Occupational History  . Not on file  Social Needs  . Financial resource strain: Not on file  . Food insecurity:    Worry: Not on file    Inability: Not on file  . Transportation needs:    Medical: Not on file    Non-medical: Not on file  Tobacco Use  . Smoking status: Never Smoker  . Smokeless tobacco: Never Used  Substance and Sexual Activity  . Alcohol use: No  . Drug use: No  . Sexual activity: Not Currently  Lifestyle  . Physical activity:    Days per week: Not on file    Minutes per session: Not on file  . Stress: Not on file  Relationships  . Social connections:    Talks on phone: Not on file    Gets together: Not on file    Attends religious service: Not on file    Active member of club or organization: Not on file    Attends meetings of clubs or organizations: Not on file    Relationship status: Not on file  . Intimate partner violence:    Fear of current or ex partner: Not on file    Emotionally abused: Not on file    Physically abused: Not on file    Forced sexual activity: Not on file  Other Topics Concern  . Not on  file  Social History Narrative  . Not on file    Family History  Problem Relation Age of Onset  . Diabetes Mother   . AAA (abdominal aortic aneurysm) Mother   . Coronary artery disease Mother   . Diabetes Father   . Coronary artery disease Father   . Dementia Father   . Breast cancer Sister      Current Outpatient Medications:  .  acetaminophen (TYLENOL) 500 MG tablet, Take 500 mg by mouth every 6 (six) hours as needed (for pain.)., Disp: , Rfl:  .  aspirin EC 81 MG tablet, Take 81 mg by mouth daily., Disp: , Rfl:  .  Dulaglutide 0.75 MG/0.5ML SOPN, Inject into the skin once a week. , Disp: , Rfl:  .  glimepiride (AMARYL) 2 MG tablet, Take 4 mg by mouth 2 (two) times daily before a meal. , Disp: , Rfl:  .   HYDROcodone-Acetaminophen (VICODIN) 5-300 MG TABS, Take 1 tablet by mouth every 4 (four) hours as needed., Disp: 15 each, Rfl: 0 .  lidocaine-prilocaine (EMLA) cream, Place a small amount over cream over port 1 hour before each treatment, cover cream with saran wrap for clothing protection, Disp: 30 g, Rfl: 1 .  loratadine (CLARITIN) 10 MG tablet, Take 10 mg by mouth daily., Disp: , Rfl:  .  losartan (COZAAR) 25 MG tablet, Take 25 mg by mouth daily., Disp: , Rfl:  .  metFORMIN (GLUCOPHAGE) 1000 MG tablet, Take 1,000-1,500 tablets by mouth See admin instructions. TAKE 1 TABLET (1000 MG) BY MOUTH IN THE MORNING & TAKE 1.5 TABLETS (1500 MG) BY MOUTH AT SUPPER, Disp: , Rfl:  .  ondansetron (ZOFRAN) 8 MG tablet, Take 1 tablet (8 mg total) by mouth 2 (two) times daily as needed for refractory nausea / vomiting. Start on day 3 after chemotherapy., Disp: 30 tablet, Rfl: 1 .  pravastatin (PRAVACHOL) 20 MG tablet, Take 20 mg by mouth daily with supper. , Disp: , Rfl:  .  prochlorperazine (COMPAZINE) 10 MG tablet, Take 1 tablet (10 mg total) by mouth every 6 (six) hours as needed (Nausea or vomiting)., Disp: 30 tablet, Rfl: 1 .  triamcinolone (KENALOG) 0.025 % cream, Apply 1 application topically daily as needed (FOR EZCEMA). , Disp: , Rfl:  No current facility-administered medications for this visit.   Facility-Administered Medications Ordered in Other Visits:  .  0.9 %  sodium chloride infusion, , Intravenous, Continuous, Sindy Guadeloupe, MD, Stopped at 07/27/18 1037 .  heparin lock flush 100 unit/mL, 500 Units, Intravenous, Once, Sindy Guadeloupe, MD .  sodium chloride flush (NS) 0.9 % injection 10 mL, 10 mL, Intravenous, PRN, Sindy Guadeloupe, MD, 10 mL at 07/27/18 0920 .  sodium chloride flush (NS) 0.9 % injection 10 mL, 10 mL, Intracatheter, PRN, Sindy Guadeloupe, MD, 10 mL at 12/09/18 1339  Physical exam:  Vitals:   12/07/18 0849  BP: (!) 161/75  Pulse: 74  Resp: 18  Temp: (!) 97.2 F (36.2 C)    TempSrc: Oral  SpO2: 97%  Weight: 180 lb (81.6 kg)   Physical Exam Constitutional:      General: He is not in acute distress. HENT:     Head: Normocephalic and atraumatic.  Eyes:     Pupils: Pupils are equal, round, and reactive to light.  Neck:     Musculoskeletal: Normal range of motion.  Cardiovascular:     Rate and Rhythm: Normal rate and regular rhythm.     Heart sounds:  Normal heart sounds.  Pulmonary:     Effort: Pulmonary effort is normal.     Breath sounds: Normal breath sounds.  Abdominal:     General: Bowel sounds are normal.     Palpations: Abdomen is soft.  Skin:    General: Skin is warm and dry.  Neurological:     Mental Status: He is alert and oriented to person, place, and time.      CMP Latest Ref Rng & Units 12/07/2018  Glucose 70 - 99 mg/dL 207(H)  BUN 8 - 23 mg/dL 11  Creatinine 0.61 - 1.24 mg/dL 0.96  Sodium 135 - 145 mmol/L 138  Potassium 3.5 - 5.1 mmol/L 3.6  Chloride 98 - 111 mmol/L 108  CO2 22 - 32 mmol/L 21(L)  Calcium 8.9 - 10.3 mg/dL 8.8(L)  Total Protein 6.5 - 8.1 g/dL 7.0  Total Bilirubin 0.3 - 1.2 mg/dL 0.5  Alkaline Phos 38 - 126 U/L 85  AST 15 - 41 U/L 22  ALT 0 - 44 U/L 14   CBC Latest Ref Rng & Units 12/07/2018  WBC 4.0 - 10.5 K/uL 8.5  Hemoglobin 13.0 - 17.0 g/dL 11.9(L)  Hematocrit 39.0 - 52.0 % 38.1(L)  Platelets 150 - 400 K/uL 116(L)      Assessment and plan- Patient is a 78 y.o. male stage IV colon adenocarcinoma with lung metastases.    He is here for on treatment assessment prior to cycle 12 of FOLFOX Avastin chemotherapy  Counts okay to proceed with cycle 12 of FOLFOX Avastin chemotherapy today.  He will come for pump disconnect on day 3.  He is due for repeat scans in 3 weeks time.  I will see him tentatively in 2 weeks time to discuss the results.  Patient has a very slow-growing colon cancer with few lung nodules but no other evidence of metastatic disease.  Patient desires a treatment break from chemotherapy for a  couple of months before restarting and I think it would be reasonable in his case to do that given the slow indolent nature of his disease.   Visit Diagnosis 1. Malignant neoplasm metastatic to lung, unspecified laterality ([place])   2. 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[place])   3. Encounter for antineoplastic chemotherapy      Dr. Randa Evens, MD, MPH [facility] at [facility] [phone] 12/09/2018 2:26 PM

## 2018-12-13 ENCOUNTER — Ambulatory Visit
Admission: RE | Admit: 2018-12-13 | Discharge: 2018-12-13 | Disposition: A | Discharge status: Home / Self Care | Payer: Medicare Other | Source: Ambulatory Visit | Attending: Oncology | Admitting: Oncology

## 2018-12-13 DIAGNOSIS — C189 Malignant neoplasm of colon, unspecified: Secondary | ICD-10-CM | POA: Insufficient documentation

## 2018-12-13 DIAGNOSIS — C7800 Secondary malignant neoplasm of unspecified lung: Secondary | ICD-10-CM | POA: Insufficient documentation

## 2018-12-13 MED ORDER — IOPAMIDOL (ISOVUE-300) INJECTION 61%
100.0000 mL | Freq: Once | INTRAVENOUS | Status: AC | PRN
  Administered 2018-12-13: 100 mL via INTRAVENOUS

## 2018-12-20 ENCOUNTER — Other Ambulatory Visit: Payer: Self-pay

## 2018-12-20 ENCOUNTER — Inpatient Hospital Stay: Payer: Medicare Other | Attending: Oncology | Admitting: Oncology

## 2018-12-20 ENCOUNTER — Inpatient Hospital Stay: Payer: Medicare Other

## 2018-12-20 ENCOUNTER — Encounter: Payer: Self-pay | Admitting: Oncology

## 2018-12-20 VITALS — BP 168/72 | HR 72 | Temp 95.9°F | Resp 18 | Wt 176.6 lb

## 2018-12-20 DIAGNOSIS — Z7189 Other specified counseling: Secondary | ICD-10-CM | POA: Insufficient documentation

## 2018-12-20 DIAGNOSIS — C7800 Secondary malignant neoplasm of unspecified lung: Secondary | ICD-10-CM | POA: Diagnosis present

## 2018-12-20 DIAGNOSIS — Z5112 Encounter for antineoplastic immunotherapy: Secondary | ICD-10-CM | POA: Insufficient documentation

## 2018-12-20 DIAGNOSIS — G620 Drug-induced polyneuropathy: Secondary | ICD-10-CM | POA: Insufficient documentation

## 2018-12-20 DIAGNOSIS — Z9221 Personal history of antineoplastic chemotherapy: Secondary | ICD-10-CM | POA: Diagnosis not present

## 2018-12-20 DIAGNOSIS — C187 Malignant neoplasm of sigmoid colon: Secondary | ICD-10-CM | POA: Diagnosis not present

## 2018-12-20 DIAGNOSIS — T451X5A Adverse effect of antineoplastic and immunosuppressive drugs, initial encounter: Secondary | ICD-10-CM

## 2018-12-20 DIAGNOSIS — Z5111 Encounter for antineoplastic chemotherapy: Secondary | ICD-10-CM

## 2018-12-20 DIAGNOSIS — C7802 Secondary malignant neoplasm of left lung: Secondary | ICD-10-CM | POA: Diagnosis not present

## 2018-12-20 DIAGNOSIS — C189 Malignant neoplasm of colon, unspecified: Secondary | ICD-10-CM

## 2018-12-20 DIAGNOSIS — Z803 Family history of malignant neoplasm of breast: Secondary | ICD-10-CM | POA: Insufficient documentation

## 2018-12-20 DIAGNOSIS — C7801 Secondary malignant neoplasm of right lung: Secondary | ICD-10-CM | POA: Diagnosis not present

## 2018-12-20 LAB — CBC WITH DIFFERENTIAL/PLATELET
Abs Immature Granulocytes: 0.28 10*3/uL — ABNORMAL HIGH (ref 0.00–0.07)
BASOS PCT: 1 %
Basophils Absolute: 0.1 10*3/uL (ref 0.0–0.1)
EOS PCT: 1 %
Eosinophils Absolute: 0.1 10*3/uL (ref 0.0–0.5)
HCT: 39.4 % (ref 39.0–52.0)
Hemoglobin: 12.6 g/dL — ABNORMAL LOW (ref 13.0–17.0)
Immature Granulocytes: 3 %
Lymphocytes Relative: 25 %
Lymphs Abs: 2.5 10*3/uL (ref 0.7–4.0)
MCH: 29.8 pg (ref 26.0–34.0)
MCHC: 32 g/dL (ref 30.0–36.0)
MCV: 93.1 fL (ref 80.0–100.0)
Monocytes Absolute: 0.9 10*3/uL (ref 0.1–1.0)
Monocytes Relative: 10 %
Neutro Abs: 5.9 10*3/uL (ref 1.7–7.7)
Neutrophils Relative %: 60 %
PLATELETS: 102 10*3/uL — AB (ref 150–400)
RBC: 4.23 MIL/uL (ref 4.22–5.81)
RDW: 15.5 % (ref 11.5–15.5)
WBC: 9.8 10*3/uL (ref 4.0–10.5)
nRBC: 0.2 % (ref 0.0–0.2)

## 2018-12-20 LAB — COMPREHENSIVE METABOLIC PANEL
ALT: 14 U/L (ref 0–44)
AST: 19 U/L (ref 15–41)
Albumin: 4 g/dL (ref 3.5–5.0)
Alkaline Phosphatase: 101 U/L (ref 38–126)
Anion gap: 7 (ref 5–15)
BUN: 10 mg/dL (ref 8–23)
CO2: 25 mmol/L (ref 22–32)
CREATININE: 1.13 mg/dL (ref 0.61–1.24)
Calcium: 8.8 mg/dL — ABNORMAL LOW (ref 8.9–10.3)
Chloride: 105 mmol/L (ref 98–111)
GFR calc Af Amer: >60 mL/min (ref >60)
GFR calc non Af Amer: >60 mL/min (ref >60)
Glucose, Bld: 192 mg/dL — ABNORMAL HIGH (ref 70–99)
Potassium: 3.9 mmol/L (ref 3.5–5.1)
Sodium: 137 mmol/L (ref 135–145)
Total Bilirubin: 0.5 mg/dL (ref 0.3–1.2)
Total Protein: 7 g/dL (ref 6.5–8.1)

## 2018-12-20 NOTE — Progress Notes (Signed)
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 Patient is a 78 year old male who presented with evidence of bowel obstruction on 02/06/2017. At that time he had progressive abdominal distention and no bowel movement for about one week. CT abdomen showed an apple core lesion in the descending/sigmoid colon.  2. Patient underwent Hartmann's procedure for obstructing sigmoid colon mass on 02/06/2017. Preoperative CEA was 5.0  3. 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 It is 0 out of 15 lymph nodes were positive for malignancy. Perineural and lymphovascular invasion was present. Margins were negative.pT3N0. MMR stable.  4. Given that he had high risk stage II colon cancer including he presented with obstruction and has LVI and perineural invasion- adjuvant xeloda chemotherapy was recommended for 6 months.   5.Patient had evidence of hand foot syndrome after cycle 3. Cycle 4 delayed by 1 week. Topical urea cream prescribed  6. Patient completed 8 cycles of adjuvant xeloda in October 2018. polps seen in sigmoid and decending colon but negative for malignancy  7. Repeat Ct abdomen after 8 cycles showed no metastatic disease. 6 mm lung nodule noted in RLL  8.Patient had a repeat colonoscopy in October 2018 which showed polyps in his transverse and descending colon which were taken out and were negative for high-grade dysplasia or  malignancy. Patient subsequently underwent colostomy takedown procedure by Dr. Burt Knack  9. Repeat CT chest abdomen/ pelvis showed increase in the size of previously seen lung nodules largest being 1.4 cm consistent with metastatic disease. PET/CT showed mild uptake in 2 lung nodules. Others were not hypermetabolic. No other evidence of metastatic disease  10. Repeat lung biopsy consistent with colon adenocarcinoma. Comprehensive RAS panel testing showed mutant KRAS  11.Given the slow rate of growth of his colon cancer and desire to maintain his quality of life plan was to start chemotherapy after he gets a 70-monthbreak in summer. Repeat CT chest abdomen and pelvis in August 2019 showed mild increase in the size of his lung nodules. No new lung nodules are no new sites of distant metastatic disease.FOLFOX Avastin chemotherapy started on 06/22/2018.  Patient completed 12 cycles of FOLFOX Avastin chemotherapy with continued response to his lung lesions.  Plan is to drop oxaliplatin and continue 5-FU Avastin chemotherapy every 3 weeks instead of every 2 weeks    Interval history-he feels well today and denies any significant fatigue nausea or vomiting.  He does have some tingling numbness in his fingertips and toes which has remained essentially stable  ECOG PS- 1 Pain scale- 0 Opioid associated constipation- no  Review of systems- Review of Systems  Constitutional: Negative for chills, fever, malaise/fatigue and weight loss.  HENT: Negative for congestion, ear discharge and nosebleeds.   Eyes: Negative for blurred vision.  Respiratory: Negative for cough, hemoptysis, sputum production, shortness of breath and wheezing.   Cardiovascular: Negative for chest pain, palpitations, orthopnea and claudication.  Gastrointestinal: Negative for abdominal pain, blood in stool, constipation, diarrhea, heartburn, melena, nausea and vomiting.  Genitourinary: Negative for dysuria, flank  pain,  frequency, hematuria and urgency.  Musculoskeletal: Negative for back pain, joint pain and myalgias.  Skin: Negative for rash.  Neurological: Positive for sensory change (Peripheral neuropathy). Negative for dizziness, tingling, focal weakness, seizures, weakness and headaches.  Endo/Heme/Allergies: Does not bruise/bleed easily.  Psychiatric/Behavioral: Negative for depression and suicidal ideas. The patient does not have insomnia.       No Known Allergies   Past Medical History:  Diagnosis Date  . Cancer ([place])    skin cancer-nose w graft,also shoulder- basal cell per pt  . Cataract    r eye  . Chronic kidney disease    kidney stones 20 y ago per pt  . Colon cancer ([place])   . Diabetes mellitus without complication ([place])   . Eczema   . History of kidney stones   . Hyperlipidemia   . Hypertension   . Vertigo      Past Surgical History:  Procedure Laterality Date  . CATARACT EXTRACTION Left   . COLECTOMY WITH COLOSTOMY CREATION/HARTMANN PROCEDURE N/A 02/06/2017   Procedure: COLECTOMY WITH COLOSTOMY CREATION/HARTMANN PROCEDURE;  Surgeon: [name], MD;  Location: [facility] ORS;  Service: General;  Laterality: N/A;  . COLON SURGERY    . COLONOSCOPY WITH PROPOFOL N/A 09/04/2017   Procedure: COLONOSCOPY WITH PROPOFOL;  Surgeon: Jonathon Bellows, MD;  Location: [facility] ENDOSCOPY;  Service: Gastroenterology;  Laterality: N/A;  . COLOSTOMY CLOSURE N/A 10/20/2017   Procedure: COLOSTOMY CLOSURE;  Surgeon: [name], MD;  Location: [facility] ORS;  Service: General;  Laterality: N/A;  . CYSTOSCOPY    . FLEXIBLE SIGMOIDOSCOPY N/A 02/06/2017   Procedure: FLEXIBLE SIGMOIDOSCOPY;  Surgeon: Christene Lye, MD;  Location: [facility] ENDOSCOPY;  Service: Endoscopy;  Laterality: N/A;  . PORTACATH PLACEMENT N/A 06/17/2018   Procedure: INSERTION PORT-A-CATH, WITH FLUOROSCOPY;  Surgeon: [name], MD;  Location: [facility] ORS;  Service: General;  Laterality: N/A;    Social History   Socioeconomic  History  . Marital status: Married    Spouse name: Not on file  . Number of children: Not on file  . Years of education: Not on file  . Highest education level: Not on file  Occupational History  . Not on file  Social Needs  . Financial resource strain: Not on file  . Food insecurity:    Worry: Not on file    Inability: Not on file  . Transportation needs:    Medical: Not on file    Non-medical: Not on file  Tobacco Use  . Smoking status: Never Smoker  . Smokeless tobacco: Never Used  Substance and Sexual Activity  . Alcohol use: No  . Drug use: No  . Sexual activity: Not Currently  Lifestyle  . Physical activity:    Days per week: Not on file    Minutes per session: Not on file  . Stress: Not on file  Relationships  . Social connections:    Talks on phone: Not on file    Gets together: Not on file    Attends religious service: Not on file    Active member of club or organization: Not on file    Attends meetings of clubs or organizations: Not on file    Relationship status: Not on file  . Intimate partner violence:    Fear of current or ex partner: Not on file    Emotionally abused: Not on file    Physically abused: Not on file    Forced sexual activity: Not on file  Other  Topics Concern  . Not on file  Social History Narrative  . Not on file    Family History  Problem Relation Age of Onset  . Diabetes Mother   . AAA (abdominal aortic aneurysm) Mother   . Coronary artery disease Mother   . Diabetes Father   . Coronary artery disease Father   . Dementia Father   . Breast cancer Sister      Current Outpatient Medications:  .  aspirin EC 81 MG tablet, Take 81 mg by mouth daily., Disp: , Rfl:  .  Dulaglutide 0.75 MG/0.5ML SOPN, Inject into the skin once a week. , Disp: , Rfl:  .  glimepiride (AMARYL) 2 MG tablet, Take 4 mg by mouth 2 (two) times daily before a meal. , Disp: , Rfl:  .  lidocaine-prilocaine (EMLA) cream, Place a small amount over cream over  port 1 hour before each treatment, cover cream with saran wrap for clothing protection, Disp: 30 g, Rfl: 1 .  loratadine (CLARITIN) 10 MG tablet, Take 10 mg by mouth daily., Disp: , Rfl:  .  losartan (COZAAR) 25 MG tablet, Take 25 mg by mouth daily., Disp: , Rfl:  .  metFORMIN (GLUCOPHAGE) 1000 MG tablet, Take 1,000-1,500 tablets by mouth See admin instructions. TAKE 1 TABLET (1000 MG) BY MOUTH IN THE MORNING & TAKE 1.5 TABLETS (1500 MG) BY MOUTH AT SUPPER, Disp: , Rfl:  .  pravastatin (PRAVACHOL) 20 MG tablet, Take 20 mg by mouth daily with supper. , Disp: , Rfl:  .  triamcinolone (KENALOG) 0.025 % cream, Apply 1 application topically daily as needed (FOR EZCEMA). , Disp: , Rfl:  .  acetaminophen (TYLENOL) 500 MG tablet, Take 500 mg by mouth every 6 (six) hours as needed (for pain.)., Disp: , Rfl:  .  HYDROcodone-Acetaminophen (VICODIN) 5-300 MG TABS, Take 1 tablet by mouth every 4 (four) hours as needed. (Patient not taking: Reported on 12/20/2018), Disp: 15 each, Rfl: 0 .  ondansetron (ZOFRAN) 8 MG tablet, Take 1 tablet (8 mg total) by mouth 2 (two) times daily as needed for refractory nausea / vomiting. Start on day 3 after chemotherapy. (Patient not taking: Reported on 12/20/2018), Disp: 30 tablet, Rfl: 1 .  prochlorperazine (COMPAZINE) 10 MG tablet, Take 1 tablet (10 mg total) by mouth every 6 (six) hours as needed (Nausea or vomiting). (Patient not taking: Reported on 12/20/2018), Disp: 30 tablet, Rfl: 1 No current facility-administered medications for this visit.   Facility-Administered Medications Ordered in Other Visits:  .  0.9 %  sodium chloride infusion, , Intravenous, Continuous, Sindy Guadeloupe, MD, Stopped at 07/27/18 1037 .  heparin lock flush 100 unit/mL, 500 Units, Intravenous, Once, Sindy Guadeloupe, MD .  sodium chloride flush (NS) 0.9 % injection 10 mL, 10 mL, Intravenous, PRN, Sindy Guadeloupe, MD, 10 mL at 07/27/18 0920  Physical exam:  Vitals:   12/20/18 0926  BP: (!) 168/72    Pulse: 72  Resp: 18  Temp: (!) 95.9 F (35.5 C)  TempSrc: Tympanic  Weight: 176 lb 9.6 oz (80.1 kg)   Physical Exam Constitutional:      General: He is not in acute distress. HENT:     Head: Normocephalic and atraumatic.  Eyes:     Pupils: Pupils are equal, round, and reactive to light.  Neck:     Musculoskeletal: Normal range of motion.  Cardiovascular:     Rate and Rhythm: Normal rate and regular rhythm.     Heart sounds: Normal  heart sounds.  Pulmonary:     Effort: Pulmonary effort is normal.     Breath sounds: Normal breath sounds.  Abdominal:     General: Bowel sounds are normal.     Palpations: Abdomen is soft.  Skin:    General: Skin is warm and dry.  Neurological:     Mental Status: He is alert and oriented to person, place, and time.      CMP Latest Ref Rng & Units 12/20/2018  Glucose 70 - 99 mg/dL 192(H)  BUN 8 - 23 mg/dL 10  Creatinine 0.61 - 1.24 mg/dL 1.13  Sodium 135 - 145 mmol/L 137  Potassium 3.5 - 5.1 mmol/L 3.9  Chloride 98 - 111 mmol/L 105  CO2 22 - 32 mmol/L 25  Calcium 8.9 - 10.3 mg/dL 8.8(L)  Total Protein 6.5 - 8.1 g/dL 7.0  Total Bilirubin 0.3 - 1.2 mg/dL 0.5  Alkaline Phos 38 - 126 U/L 101  AST 15 - 41 U/L 19  ALT 0 - 44 U/L 14   CBC Latest Ref Rng & Units 12/20/2018  WBC 4.0 - 10.5 K/uL 9.8  Hemoglobin 13.0 - 17.0 g/dL 12.6(L)  Hematocrit 39.0 - 52.0 % 39.4  Platelets 150 - 400 K/uL 102(L)    No images are attached to the encounter.  Ct Chest W Contrast  Result Date: 12/13/2018 CLINICAL DATA:  Metastatic colon cancer restaging EXAM: CT CHEST, ABDOMEN, AND PELVIS WITH CONTRAST TECHNIQUE: Multidetector CT imaging of the chest, abdomen and pelvis was performed following the standard protocol during bolus administration of intravenous contrast. CONTRAST:  126m ISOVUE-300 IOPAMIDOL (ISOVUE-300) INJECTION 61% COMPARISON:  Multiple exams, including 09/17/2018 FINDINGS: CT CHEST FINDINGS Cardiovascular: Left Port-A-Cath tip: Cavoatrial  junction. Atherosclerotic calcification of the thoracic aorta and coronary arteries. Mediastinum/Nodes: Small mediastinal lymph nodes are not pathologically enlarged. Lungs/Pleura: 0.6 by 0.5 cm right lower lobe pulmonary nodule on image 131/4, formerly 0.7 by 0.6 cm, with reduced solidity centrally possibly from minimal central cavitation. 0.6 by 0.5 cm left upper lobe nodule on image 60/4, previously 0.7 by 0.6 cm, again with reduced solidity. Left upper lobe peribronchovascular nodule 1.2 by 0.8 cm on image 72/4, formerly 1.2 by 1.1 cm. Lingular nodule 1.4 by 1.0 cm, by my measurements previously 1.3 by 1.1 cm, roughly stable. Stable scarring in the left lower lobe. Musculoskeletal: Thoracic kyphosis with multilevel spurring. Multiple vertebral lucencies at T9, T10, L3, the left L4 pedicle, and at L5, probably from hemangiomas, and not appreciably changed. CT ABDOMEN PELVIS FINDINGS Hepatobiliary: Stable gallstones. Stable 6 mm hypodense lesion in the dome of the right hepatic lobe on image 48/2. Pancreas: Unremarkable Spleen: Unremarkable Adrenals/Urinary Tract: The adrenal glands appear normal. 2 mm nonobstructive right kidney upper pole calculus, image 88/5. The kidneys appear otherwise unremarkable. Stomach/Bowel: Anastomotic staple line along the sigmoid colon appears unremarkable. Scattered descending colon diverticula. Vascular/Lymphatic: Aortoiliac atherosclerotic vascular disease. No pathologic adenopathy identified. Reproductive: Dense calcification along the central prostate zone. Other: No definite omental or peritoneal nodularity is identified. Musculoskeletal: Unremarkable IMPRESSION: 1. Reduced size of most of the pulmonary nodules, and reduced solidity of 2 of the nodules, favoring further response to therapy. 2. No findings of active intra-malignancy. 3. Other imaging findings of potential clinical significance: Aortic Atherosclerosis (ICD10-I70.0). Coronary atherosclerosis. Vertebral  hemangiomas. Cholelithiasis. 2 mm nonobstructive right kidney upper pole calculus. Scattered descending colon diverticula. Electronically Signed   By: WVan ClinesM.D.   On: 12/13/2018 11:10   Ct Abdomen Pelvis W Contrast  Result Date: 12/13/2018  CLINICAL DATA:  Metastatic colon cancer restaging EXAM: CT CHEST, ABDOMEN, AND PELVIS WITH CONTRAST TECHNIQUE: Multidetector CT imaging of the chest, abdomen and pelvis was performed following the standard protocol during bolus administration of intravenous contrast. CONTRAST:  18m ISOVUE-300 IOPAMIDOL (ISOVUE-300) INJECTION 61% COMPARISON:  Multiple exams, including 09/17/2018 FINDINGS: CT CHEST FINDINGS Cardiovascular: Left Port-A-Cath tip: Cavoatrial junction. Atherosclerotic calcification of the thoracic aorta and coronary arteries. Mediastinum/Nodes: Small mediastinal lymph nodes are not pathologically enlarged. Lungs/Pleura: 0.6 by 0.5 cm right lower lobe pulmonary nodule on image 131/4, formerly 0.7 by 0.6 cm, with reduced solidity centrally possibly from minimal central cavitation. 0.6 by 0.5 cm left upper lobe nodule on image 60/4, previously 0.7 by 0.6 cm, again with reduced solidity. Left upper lobe peribronchovascular nodule 1.2 by 0.8 cm on image 72/4, formerly 1.2 by 1.1 cm. Lingular nodule 1.4 by 1.0 cm, by my measurements previously 1.3 by 1.1 cm, roughly stable. Stable scarring in the left lower lobe. Musculoskeletal: Thoracic kyphosis with multilevel spurring. Multiple vertebral lucencies at T9, T10, L3, the left L4 pedicle, and at L5, probably from hemangiomas, and not appreciably changed. CT ABDOMEN PELVIS FINDINGS Hepatobiliary: Stable gallstones. Stable 6 mm hypodense lesion in the dome of the right hepatic lobe on image 48/2. Pancreas: Unremarkable Spleen: Unremarkable Adrenals/Urinary Tract: The adrenal glands appear normal. 2 mm nonobstructive right kidney upper pole calculus, image 88/5. The kidneys appear otherwise unremarkable.  Stomach/Bowel: Anastomotic staple line along the sigmoid colon appears unremarkable. Scattered descending colon diverticula. Vascular/Lymphatic: Aortoiliac atherosclerotic vascular disease. No pathologic adenopathy identified. Reproductive: Dense calcification along the central prostate zone. Other: No definite omental or peritoneal nodularity is identified. Musculoskeletal: Unremarkable IMPRESSION: 1. Reduced size of most of the pulmonary nodules, and reduced solidity of 2 of the nodules, favoring further response to therapy. 2. No findings of active intra-malignancy. 3. Other imaging findings of potential clinical significance: Aortic Atherosclerosis (ICD10-I70.0). Coronary atherosclerosis. Vertebral hemangiomas. Cholelithiasis. 2 mm nonobstructive right kidney upper pole calculus. Scattered descending colon diverticula. Electronically Signed   By: WVan ClinesM.D.   On: 12/13/2018 11:10     Assessment and plan- Patient is a 78y.o. male stage IV colon adenocarcinoma with lung metastases. He is here to discuss his CT scan results and further management  I have reviewed CT chest abdomen and pelvis images independently and discussed findings with the patient.  Patient is now completed 12 cycles of FOLFOX Avastin chemotherapy.  He has low-volume lung metastases between 0.5 to 1 cm lesions which continue to respond to treatment and continue to shrink in size.  There is no new evidence of metastatic disease.  Patient initially desired treatment break at this time and restart chemotherapy after 2 months.  We discussed that we could continue chemotherapy but drop the oxaliplatin given his ongoing neuropathy and to chemo every 3 weeks instead of every 2 weeks to maintain his quality of life as well as stability of blood counts.  Patient is agreeable to this plan.  He will proceed with cycle 13 of 5-FU and Avastin chemotherapy on 12/28/2018 and we will check CBC CMP CEA and urine protein on that day.  I  will see him back in 4 weeks time with CBC CMP and urine protein for cycle 14 of 5-FU and Avastin chemotherapy.  Patient may desire some treatment break especially during summer and he will let [place] of time.  Treatment will be given with the palliative intent until progression or toxicity.  Patient verbalized understanding  Total face to face encounter time for this patient visit was 30 min. >50% of the time was  spent in counseling and coordination of care.     Visit Diagnosis 1. Encounter for antineoplastic chemotherapy   2. Goals of care, counseling/discussion   3. Malignant neoplasm metastatic to lung, unspecified laterality ([place])   4. 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[place])      Dr. Randa Evens, MD, MPH [facility] [phone] 12/20/2018 10:16 AM

## 2018-12-20 NOTE — Addendum Note (Signed)
Addended by: Luella Cook on: 12/20/2018 10:46 AM   Modules accepted: Orders

## 2018-12-20 NOTE — Progress Notes (Signed)
Here for follow up " im doing good I thing -I feel weak every now and then. Stated mild hoarsness "per pt. Stated he has been having " tingling in fingers and toes "

## 2018-12-28 ENCOUNTER — Inpatient Hospital Stay: Payer: Medicare Other

## 2018-12-28 VITALS — BP 154/71 | HR 75 | Temp 97.1°F | Resp 18

## 2018-12-28 DIAGNOSIS — C7800 Secondary malignant neoplasm of unspecified lung: Secondary | ICD-10-CM

## 2018-12-28 DIAGNOSIS — C189 Malignant neoplasm of colon, unspecified: Secondary | ICD-10-CM

## 2018-12-28 DIAGNOSIS — Z5112 Encounter for antineoplastic immunotherapy: Secondary | ICD-10-CM | POA: Diagnosis not present

## 2018-12-28 LAB — COMPREHENSIVE METABOLIC PANEL
ALT: 15 U/L (ref 0–44)
ANION GAP: 12 (ref 5–15)
AST: 25 U/L (ref 15–41)
Albumin: 4.2 g/dL (ref 3.5–5.0)
Alkaline Phosphatase: 68 U/L (ref 38–126)
BUN: 16 mg/dL (ref 8–23)
CO2: 22 mmol/L (ref 22–32)
Calcium: 9.2 mg/dL (ref 8.9–10.3)
Chloride: 102 mmol/L (ref 98–111)
Creatinine, Ser: 1.04 mg/dL (ref 0.61–1.24)
GFR calc Af Amer: >60 mL/min (ref >60)
GFR calc non Af Amer: >60 mL/min (ref >60)
Glucose, Bld: 155 mg/dL — ABNORMAL HIGH (ref 70–99)
Potassium: 4.1 mmol/L (ref 3.5–5.1)
Sodium: 136 mmol/L (ref 135–145)
Total Bilirubin: 0.4 mg/dL (ref 0.3–1.2)
Total Protein: 7.4 g/dL (ref 6.5–8.1)

## 2018-12-28 LAB — CBC WITH DIFFERENTIAL/PLATELET
Abs Immature Granulocytes: 0.04 10*3/uL (ref 0.00–0.07)
Basophils Absolute: 0 10*3/uL (ref 0.0–0.1)
Basophils Relative: 1 %
Eosinophils Absolute: 0.1 10*3/uL (ref 0.0–0.5)
Eosinophils Relative: 2 %
HCT: 38.2 % — ABNORMAL LOW (ref 39.0–52.0)
Hemoglobin: 12.1 g/dL — ABNORMAL LOW (ref 13.0–17.0)
Immature Granulocytes: 1 %
Lymphocytes Relative: 25 %
Lymphs Abs: 2.1 10*3/uL (ref 0.7–4.0)
MCH: 30 pg (ref 26.0–34.0)
MCHC: 31.7 g/dL (ref 30.0–36.0)
MCV: 94.6 fL (ref 80.0–100.0)
Monocytes Absolute: 0.7 10*3/uL (ref 0.1–1.0)
Monocytes Relative: 9 %
Neutro Abs: 5.4 10*3/uL (ref 1.7–7.7)
Neutrophils Relative %: 62 %
Platelets: 247 10*3/uL (ref 150–400)
RBC: 4.04 MIL/uL — AB (ref 4.22–5.81)
RDW: 15.6 % — ABNORMAL HIGH (ref 11.5–15.5)
WBC: 8.5 10*3/uL (ref 4.0–10.5)
nRBC: 0 % (ref 0.0–0.2)

## 2018-12-28 LAB — PROTEIN, URINE, RANDOM: TOTAL PROTEIN, URINE: 14 mg/dL

## 2018-12-28 MED ORDER — DEXTROSE 5 % IV SOLN
Freq: Once | INTRAVENOUS | Status: DC
  Filled 2018-12-28: qty 250

## 2018-12-28 MED ORDER — DEXAMETHASONE SODIUM PHOSPHATE 10 MG/ML IJ SOLN
10.0000 mg | Freq: Once | INTRAMUSCULAR | Status: AC
  Administered 2018-12-28: 10 mg via INTRAVENOUS
  Filled 2018-12-28: qty 1

## 2018-12-28 MED ORDER — PALONOSETRON HCL INJECTION 0.25 MG/5ML
0.2500 mg | Freq: Once | INTRAVENOUS | Status: AC
  Administered 2018-12-28: 0.25 mg via INTRAVENOUS
  Filled 2018-12-28: qty 5

## 2018-12-28 MED ORDER — SODIUM CHLORIDE 0.9 % IV SOLN: 5.0000 mg/kg | Freq: Once | INTRAVENOUS | Status: DC

## 2018-12-28 MED ORDER — SODIUM CHLORIDE 0.9 % IV SOLN
5.0000 mg/kg | Freq: Once | INTRAVENOUS | Status: AC
  Administered 2018-12-28: 400 mg via INTRAVENOUS
  Filled 2018-12-28: qty 16

## 2018-12-28 MED ORDER — SODIUM CHLORIDE 0.9 % IV SOLN
INTRAVENOUS | Status: DC
  Administered 2018-12-28: 09:00:00 via INTRAVENOUS
  Filled 2018-12-28: qty 250

## 2018-12-28 MED ORDER — FLUOROURACIL CHEMO INJECTION 2.5 GM/50ML
400.0000 mg/m2 | Freq: Once | INTRAVENOUS | Status: AC
  Administered 2018-12-28: 800 mg via INTRAVENOUS
  Filled 2018-12-28: qty 16

## 2018-12-28 MED ORDER — LEUCOVORIN CALCIUM INJECTION 100 MG
20.0000 mg/m2 | Freq: Once | INTRAMUSCULAR | Status: AC
  Administered 2018-12-28: 40 mg via INTRAVENOUS
  Filled 2018-12-28: qty 2

## 2018-12-28 MED ORDER — SODIUM CHLORIDE 0.9 % IV SOLN
2400.0000 mg/m2 | INTRAVENOUS | Status: DC
  Administered 2018-12-28: 4850 mg via INTRAVENOUS
  Filled 2018-12-28: qty 97

## 2018-12-30 ENCOUNTER — Inpatient Hospital Stay: Payer: Medicare Other

## 2018-12-30 ENCOUNTER — Telehealth: Payer: Self-pay | Admitting: *Deleted

## 2018-12-30 DIAGNOSIS — C7800 Secondary malignant neoplasm of unspecified lung: Secondary | ICD-10-CM

## 2018-12-30 DIAGNOSIS — Z5112 Encounter for antineoplastic immunotherapy: Secondary | ICD-10-CM | POA: Diagnosis not present

## 2018-12-30 DIAGNOSIS — C189 Malignant neoplasm of colon, unspecified: Secondary | ICD-10-CM

## 2018-12-30 MED ORDER — PEGFILGRASTIM-CBQV 6 MG/0.6ML ~~LOC~~ SOSY
6.0000 mg | PREFILLED_SYRINGE | Freq: Once | SUBCUTANEOUS | Status: AC
  Administered 2018-12-30: 6 mg via SUBCUTANEOUS
  Filled 2018-12-30: qty 0.6

## 2018-12-30 MED ORDER — HEPARIN SOD (PORK) LOCK FLUSH 100 UNIT/ML IV SOLN
500.0000 [IU] | Freq: Once | INTRAVENOUS | Status: AC | PRN
  Administered 2018-12-30: 500 [IU]
  Filled 2018-12-30: qty 5

## 2018-12-30 MED ORDER — SODIUM CHLORIDE 0.9% FLUSH
10.0000 mL | INTRAVENOUS | Status: DC | PRN
  Administered 2018-12-30: 10 mL
  Filled 2018-12-30: qty 10

## 2018-12-30 NOTE — Telephone Encounter (Signed)
Pt called to say that his pump has stopped and he cut it off and with weather he was wondering if he could come now to get the pump off and injection. Spoke with Maudie Mercury and she is fine with him coming early. Called downstairs to let the registration staff know, called patient back and told him to come over now.

## 2019-01-14 ENCOUNTER — Telehealth: Payer: Self-pay | Admitting: *Deleted

## 2019-01-14 NOTE — Telephone Encounter (Signed)
Called patient and let him know that Dr. [name] said that since he has no fever and it just started to just try salt water gargling 3-4 times a day and if it gets worse or he runs a fever then to call back. Patient is agreeable to the plan

## 2019-01-14 NOTE — Telephone Encounter (Signed)
Called this morning with hoarseness. And some discomfort on one side of the throat when he swallows. No fever.

## 2019-01-17 ENCOUNTER — Telehealth: Payer: Self-pay | Admitting: *Deleted

## 2019-01-17 NOTE — Telephone Encounter (Signed)
None called to say that patient is hoarse can barely hear them on the telephone.  Called back and talk to the son and he said he feels like he is either congested or maybe got a cold.  His dad had gotten on the mower to mow the grass late last week and he feels like maybe is got allergies from that also.  Wanted know if there is any decongestant or some kind of cold medicine or any recommendations.  Told him he could buy some corcidin as well as taking Mucinex. He will get him some tonight and take it to him. Patient to be here tom. And we will determine if he can get chemo or not.  Son states that his neuropathy of toes and fingers are worse. I told son that we took oxaliplatin off last treatment and mover treatments from 2 weeks to 3 weeks. I told son to remind patient to tell MD in am and he will

## 2019-01-18 ENCOUNTER — Other Ambulatory Visit: Payer: Self-pay | Admitting: *Deleted

## 2019-01-18 ENCOUNTER — Other Ambulatory Visit: Payer: Self-pay

## 2019-01-18 ENCOUNTER — Encounter: Payer: Self-pay | Admitting: Oncology

## 2019-01-18 ENCOUNTER — Inpatient Hospital Stay: Payer: Medicare Other | Attending: Oncology

## 2019-01-18 ENCOUNTER — Inpatient Hospital Stay: Payer: Medicare Other

## 2019-01-18 ENCOUNTER — Inpatient Hospital Stay (HOSPITAL_BASED_OUTPATIENT_CLINIC_OR_DEPARTMENT_OTHER): Payer: Medicare Other | Admitting: Oncology

## 2019-01-18 VITALS — BP 124/66 | HR 88 | Resp 20

## 2019-01-18 VITALS — BP 112/73 | HR 102 | Temp 96.9°F | Resp 18 | Wt 175.0 lb

## 2019-01-18 DIAGNOSIS — C182 Malignant neoplasm of ascending colon: Secondary | ICD-10-CM | POA: Diagnosis not present

## 2019-01-18 DIAGNOSIS — C7800 Secondary malignant neoplasm of unspecified lung: Secondary | ICD-10-CM

## 2019-01-18 DIAGNOSIS — Z5111 Encounter for antineoplastic chemotherapy: Secondary | ICD-10-CM | POA: Diagnosis not present

## 2019-01-18 DIAGNOSIS — C189 Malignant neoplasm of colon, unspecified: Secondary | ICD-10-CM

## 2019-01-18 DIAGNOSIS — N179 Acute kidney failure, unspecified: Secondary | ICD-10-CM | POA: Diagnosis not present

## 2019-01-18 DIAGNOSIS — R7989 Other specified abnormal findings of blood chemistry: Secondary | ICD-10-CM

## 2019-01-18 DIAGNOSIS — C7801 Secondary malignant neoplasm of right lung: Secondary | ICD-10-CM

## 2019-01-18 DIAGNOSIS — C7802 Secondary malignant neoplasm of left lung: Secondary | ICD-10-CM

## 2019-01-18 DIAGNOSIS — Z5112 Encounter for antineoplastic immunotherapy: Secondary | ICD-10-CM | POA: Diagnosis present

## 2019-01-18 DIAGNOSIS — R070 Pain in throat: Secondary | ICD-10-CM

## 2019-01-18 DIAGNOSIS — G620 Drug-induced polyneuropathy: Secondary | ICD-10-CM

## 2019-01-18 DIAGNOSIS — T451X5A Adverse effect of antineoplastic and immunosuppressive drugs, initial encounter: Principal | ICD-10-CM

## 2019-01-18 LAB — CBC WITH DIFFERENTIAL/PLATELET
Abs Immature Granulocytes: 0.02 10*3/uL (ref 0.00–0.07)
Basophils Absolute: 0.1 10*3/uL (ref 0.0–0.1)
Basophils Relative: 1 %
Eosinophils Absolute: 0.2 10*3/uL (ref 0.0–0.5)
Eosinophils Relative: 2 %
HCT: 39.9 % (ref 39.0–52.0)
HEMOGLOBIN: 12.9 g/dL — AB (ref 13.0–17.0)
IMMATURE GRANULOCYTES: 0 %
LYMPHS PCT: 26 %
Lymphs Abs: 2.2 10*3/uL (ref 0.7–4.0)
MCH: 30.4 pg (ref 26.0–34.0)
MCHC: 32.3 g/dL (ref 30.0–36.0)
MCV: 93.9 fL (ref 80.0–100.0)
Monocytes Absolute: 1 10*3/uL (ref 0.1–1.0)
Monocytes Relative: 12 %
Neutro Abs: 5.2 10*3/uL (ref 1.7–7.7)
Neutrophils Relative %: 59 %
Platelets: 185 10*3/uL (ref 150–400)
RBC: 4.25 MIL/uL (ref 4.22–5.81)
RDW: 15.3 % (ref 11.5–15.5)
WBC: 8.7 10*3/uL (ref 4.0–10.5)
nRBC: 0 % (ref 0.0–0.2)

## 2019-01-18 LAB — COMPREHENSIVE METABOLIC PANEL
ALT: 15 U/L (ref 0–44)
AST: 25 U/L (ref 15–41)
Albumin: 4.5 g/dL (ref 3.5–5.0)
Alkaline Phosphatase: 68 U/L (ref 38–126)
Anion gap: 11 (ref 5–15)
BUN: 18 mg/dL (ref 8–23)
CHLORIDE: 101 mmol/L (ref 98–111)
CO2: 21 mmol/L — ABNORMAL LOW (ref 22–32)
Calcium: 8.9 mg/dL (ref 8.9–10.3)
Creatinine, Ser: 1.42 mg/dL — ABNORMAL HIGH (ref 0.61–1.24)
GFR calc Af Amer: 54 mL/min — ABNORMAL LOW (ref >60)
GFR calc non Af Amer: 47 mL/min — ABNORMAL LOW (ref >60)
Glucose, Bld: 222 mg/dL — ABNORMAL HIGH (ref 70–99)
Potassium: 3.8 mmol/L (ref 3.5–5.1)
SODIUM: 133 mmol/L — AB (ref 135–145)
Total Bilirubin: 0.8 mg/dL (ref 0.3–1.2)
Total Protein: 8 g/dL (ref 6.5–8.1)

## 2019-01-18 LAB — PROTEIN, URINE, RANDOM: Total Protein, Urine: 14 mg/dL

## 2019-01-18 MED ORDER — SODIUM CHLORIDE 0.9 % IV SOLN
5.0000 mg/kg | Freq: Once | INTRAVENOUS | Status: AC
  Administered 2019-01-18: 400 mg via INTRAVENOUS
  Filled 2019-01-18: qty 16

## 2019-01-18 MED ORDER — DEXAMETHASONE SODIUM PHOSPHATE 10 MG/ML IJ SOLN
10.0000 mg | Freq: Once | INTRAMUSCULAR | Status: AC
  Administered 2019-01-18: 10 mg via INTRAVENOUS
  Filled 2019-01-18: qty 1

## 2019-01-18 MED ORDER — SODIUM CHLORIDE 0.9 % IV SOLN
INTRAVENOUS | Status: DC
  Administered 2019-01-18: 10:00:00 via INTRAVENOUS
  Filled 2019-01-18: qty 250

## 2019-01-18 MED ORDER — SODIUM CHLORIDE 0.9 % IV SOLN: 5.0000 mg/kg | Freq: Once | INTRAVENOUS | Status: DC

## 2019-01-18 MED ORDER — SODIUM CHLORIDE 0.9 % IV SOLN
2400.0000 mg/m2 | INTRAVENOUS | Status: DC
  Administered 2019-01-18: 4850 mg via INTRAVENOUS
  Filled 2019-01-18: qty 97

## 2019-01-18 MED ORDER — FLUOROURACIL CHEMO INJECTION 2.5 GM/50ML
400.0000 mg/m2 | Freq: Once | INTRAVENOUS | Status: AC
  Administered 2019-01-18: 800 mg via INTRAVENOUS
  Filled 2019-01-18: qty 16

## 2019-01-18 MED ORDER — PALONOSETRON HCL INJECTION 0.25 MG/5ML
0.2500 mg | Freq: Once | INTRAVENOUS | Status: AC
  Administered 2019-01-18: 0.25 mg via INTRAVENOUS
  Filled 2019-01-18: qty 5

## 2019-01-18 MED ORDER — DULOXETINE HCL 30 MG PO CPEP: 30.0000 mg | ORAL_CAPSULE | Freq: Every day | ORAL | 0 refills | Status: DC

## 2019-01-18 MED ORDER — SODIUM CHLORIDE 0.9 % IV SOLN
Freq: Once | INTRAVENOUS | Status: AC
  Administered 2019-01-18: 10:00:00 via INTRAVENOUS
  Filled 2019-01-18: qty 250

## 2019-01-18 MED ORDER — LEUCOVORIN CALCIUM INJECTION 100 MG
20.0000 mg/m2 | Freq: Once | INTRAMUSCULAR | Status: AC
  Administered 2019-01-18: 40 mg via INTRAVENOUS
  Filled 2019-01-18: qty 2

## 2019-01-18 NOTE — Progress Notes (Addendum)
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 Patient is a 78 year old male who presented with evidence of bowel obstruction on 02/06/2017. At that time he had progressive abdominal distention and no bowel movement for about one week. CT abdomen showed an apple core lesion in the descending/sigmoid colon.  2. Patient underwent Hartmann's procedure for obstructing sigmoid colon mass on 02/06/2017. Preoperative CEA was 5.0  3. 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 It is 0 out of 15 lymph nodes were positive for malignancy. Perineural and lymphovascular invasion was present. Margins were negative.pT3N0. MMR stable.  4. Given that he had high risk stage II colon cancer including he presented with obstruction and has LVI and perineural invasion- adjuvant xeloda chemotherapy was recommended for 6 months.   5.Patient had evidence of hand foot syndrome after cycle 3. Cycle 4 delayed by 1 week. Topical urea cream prescribed  6. Patient completed 8 cycles of adjuvant xeloda in October 2018. polps seen in sigmoid and decending colon but negative for malignancy  7. Repeat Ct abdomen after 8 cycles showed no metastatic disease. 6 mm lung nodule noted in RLL  8.Patient had a repeat colonoscopy in October 2018 which showed polyps in his transverse and descending colon which were taken out  and were negative for high-grade dysplasia or malignancy. Patient subsequently underwent colostomy takedown procedure by Dr. Burt Knack  9. Repeat CT chest abdomen/ pelvis showed increase in the size of previously seen lung nodules largest being 1.4 cm consistent with metastatic disease. PET/CT showed mild uptake in 2 lung nodules. Others were not hypermetabolic. No other evidence of metastatic disease  10. Repeat lung biopsy consistent with colon adenocarcinoma. 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 Repeat CT chest abdomen and pelvis in August 2019 showed mild increase in the size of his lung nodules. No new lung nodules are no new sites of distant metastatic disease.FOLFOX Avastin chemotherapy started on 06/22/2018.  Patient completed 12 cycles of FOLFOX Avastin chemotherapy with continued response to his lung lesions.  Plan is to drop oxaliplatin and continue 5-FU Avastin chemotherapy every 3 weeks instead of every 2 weeks  Interval history-he reports having a sore throat over the last 3 to 4 days after he mowed his grass.  He has been having a mild hoarseness of voice since August 2019 which has remained stable with no difficulty in his speech.  He denies any shortness of breath.  He does report worsening neuropathy in his hands and feet.  ECOG PS- 1 Pain scale- 0   Review of systems- Review of Systems  Constitutional: Negative for chills, fever, malaise/fatigue and weight loss.  HENT: Positive for sore throat. Negative for congestion, ear discharge and nosebleeds.   Eyes: Negative for blurred vision.  Respiratory: Negative for cough, hemoptysis, sputum production, shortness  of breath and wheezing.   Cardiovascular: Negative for chest pain, palpitations, orthopnea and claudication.  Gastrointestinal: Negative  for abdominal pain, blood in stool, constipation, diarrhea, heartburn, melena, nausea and vomiting.  Genitourinary: Negative for dysuria, flank pain, frequency, hematuria and urgency.  Musculoskeletal: Negative for back pain, joint pain and myalgias.  Skin: Negative for rash.  Neurological: Positive for sensory change (Peripheral neuropathy). Negative for dizziness, tingling, focal weakness, seizures, weakness and headaches.  Endo/Heme/Allergies: Does not bruise/bleed easily.  Psychiatric/Behavioral: Negative for depression and suicidal ideas. The patient does not have insomnia.        No Known Allergies   Past Medical History:  Diagnosis Date  . Cancer ([place])    skin cancer-nose w graft,also shoulder- basal cell per pt  . Cataract    r eye  . Chronic kidney disease    kidney stones 20 y ago per pt  . Colon cancer ([place])   . Diabetes mellitus without complication ([place])   . Eczema   . History of kidney stones   . Hyperlipidemia   . Hypertension   . Vertigo      Past Surgical History:  Procedure Laterality Date  . CATARACT EXTRACTION Left   . COLECTOMY WITH COLOSTOMY CREATION/HARTMANN PROCEDURE N/A 02/06/2017   Procedure: COLECTOMY WITH COLOSTOMY CREATION/HARTMANN PROCEDURE;  Surgeon: [name], MD;  Location: [facility] ORS;  Service: General;  Laterality: N/A;  . COLON SURGERY    . COLONOSCOPY WITH PROPOFOL N/A 09/04/2017   Procedure: COLONOSCOPY WITH PROPOFOL;  Surgeon: Jonathon Bellows, MD;  Location: [facility] ENDOSCOPY;  Service: Gastroenterology;  Laterality: N/A;  . COLOSTOMY CLOSURE N/A 10/20/2017   Procedure: COLOSTOMY CLOSURE;  Surgeon: [name], MD;  Location: [facility] ORS;  Service: General;  Laterality: N/A;  . CYSTOSCOPY    . FLEXIBLE SIGMOIDOSCOPY N/A 02/06/2017   Procedure: FLEXIBLE SIGMOIDOSCOPY;  Surgeon: Christene Lye, MD;  Location: [facility] ENDOSCOPY;  Service: Endoscopy;  Laterality: N/A;  . PORTACATH PLACEMENT N/A 06/17/2018   Procedure: INSERTION  PORT-A-CATH, WITH FLUOROSCOPY;  Surgeon: [name], MD;  Location: [facility] ORS;  Service: General;  Laterality: N/A;    Social History   Socioeconomic History  . Marital status: Married    Spouse name: Not on file  . Number of children: Not on file  . Years of education: Not on file  . Highest education level: Not on file  Occupational History  . Not on file  Social Needs  . Financial resource strain: Not on file  . Food insecurity:    Worry: Not on file    Inability: Not on file  . Transportation needs:    Medical: Not on file    Non-medical: Not on file  Tobacco Use  . Smoking status: Never Smoker  . Smokeless tobacco: Never Used  Substance and Sexual Activity  . Alcohol use: No  . Drug use: No  . Sexual activity: Not Currently  Lifestyle  . Physical activity:    Days per week: Not on file    Minutes per session: Not on file  . Stress: Not on file  Relationships  . Social connections:    Talks on phone: Not on file    Gets together: Not on file    Attends religious service: Not on file    Active member of club or organization: Not on file    Attends meetings of clubs or organizations: Not on file    Relationship status: Not on file  . Intimate partner violence:  Fear of current or ex partner: Not on file    Emotionally abused: Not on file    Physically abused: Not on file    Forced sexual activity: Not on file  Other Topics Concern  . Not on file  Social History Narrative  . Not on file    Family History  Problem Relation Age of Onset  . Diabetes Mother   . AAA (abdominal aortic aneurysm) Mother   . Coronary artery disease Mother   . Diabetes Father   . Coronary artery disease Father   . Dementia Father   . Breast cancer Sister      Current Outpatient Medications:  .  aspirin EC 81 MG tablet, Take 81 mg by mouth daily., Disp: , Rfl:  .  Dulaglutide 0.75 MG/0.5ML SOPN, Inject into the skin once a week. , Disp: , Rfl:  .  glimepiride (AMARYL)  2 MG tablet, Take 4 mg by mouth 2 (two) times daily before a meal. , Disp: , Rfl:  .  lidocaine-prilocaine (EMLA) cream, Place a small amount over cream over port 1 hour before each treatment, cover cream with saran wrap for clothing protection, Disp: 30 g, Rfl: 1 .  loratadine (CLARITIN) 10 MG tablet, Take 10 mg by mouth daily., Disp: , Rfl:  .  losartan (COZAAR) 25 MG tablet, Take 25 mg by mouth daily., Disp: , Rfl:  .  metFORMIN (GLUCOPHAGE) 1000 MG tablet, Take 1,000-1,500 tablets by mouth See admin instructions. TAKE 1 TABLET (1000 MG) BY MOUTH IN THE MORNING & TAKE 1.5 TABLETS (1500 MG) BY MOUTH AT SUPPER, Disp: , Rfl:  .  pravastatin (PRAVACHOL) 20 MG tablet, Take 20 mg by mouth daily with supper. , Disp: , Rfl:  .  triamcinolone (KENALOG) 0.025 % cream, Apply 1 application topically daily as needed (FOR EZCEMA). , Disp: , Rfl:  .  acetaminophen (TYLENOL) 500 MG tablet, Take 500 mg by mouth every 6 (six) hours as needed (for pain.)., Disp: , Rfl:  .  HYDROcodone-Acetaminophen (VICODIN) 5-300 MG TABS, Take 1 tablet by mouth every 4 (four) hours as needed. (Patient not taking: Reported on 01/18/2019), Disp: 15 each, Rfl: 0 .  ondansetron (ZOFRAN) 8 MG tablet, Take 1 tablet (8 mg total) by mouth 2 (two) times daily as needed for refractory nausea / vomiting. Start on day 3 after chemotherapy. (Patient not taking: Reported on 01/18/2019), Disp: 30 tablet, Rfl: 1 .  prochlorperazine (COMPAZINE) 10 MG tablet, Take 1 tablet (10 mg total) by mouth every 6 (six) hours as needed (Nausea or vomiting). (Patient not taking: Reported on 01/18/2019), Disp: 30 tablet, Rfl: 1 No current facility-administered medications for this visit.   Facility-Administered Medications Ordered in Other Visits:  .  0.9 %  sodium chloride infusion, , Intravenous, Continuous, Sindy Guadeloupe, MD, Stopped at 07/27/18 1037 .  0.9 %  sodium chloride infusion, , Intravenous, Once, Sindy Guadeloupe, MD, Last Rate: 999 mL/hr at 01/18/19  0942 .  0.9 %  sodium chloride infusion, , Intravenous, Continuous, Sindy Guadeloupe, MD, Last Rate: 10 mL/hr at 01/18/19 0947 .  bevacizumab (AVASTIN) 400 mg in sodium chloride 0.9 % 100 mL chemo infusion, 5 mg/kg, Intravenous, Once, Sindy Guadeloupe, MD .  dexamethasone (DECADRON) injection 10 mg, 10 mg, Intravenous, Once, Sindy Guadeloupe, MD .  fluorouracil (ADRUCIL) 4,850 mg in sodium chloride 0.9 % 53 mL chemo infusion, 2,400 mg/m2 (Treatment Plan Recorded), Intravenous, 1 day or 1 dose, Sindy Guadeloupe, MD .  fluorouracil (ADRUCIL) chemo injection 800 mg, 400 mg/m2 (Treatment Plan Recorded), Intravenous, Once, Sindy Guadeloupe, MD .  heparin lock flush 100 unit/mL, 500 Units, Intravenous, Once, Sindy Guadeloupe, MD .  leucovorin injection 40 mg, 20 mg/m2 (Treatment Plan Recorded), Intravenous, Once, Sindy Guadeloupe, MD .  palonosetron (ALOXI) injection 0.25 mg, 0.25 mg, Intravenous, Once, Sindy Guadeloupe, MD .  sodium chloride flush (NS) 0.9 % injection 10 mL, 10 mL, Intravenous, PRN, Sindy Guadeloupe, MD, 10 mL at 07/27/18 0920  Physical exam:  Vitals:   01/18/19 0848 01/18/19 0849  BP: 112/73 112/73  Pulse: (!) 102 (!) 102  Resp: 18 18  Temp: (!) 96.9 F (36.1 C) (!) 96.9 F (36.1 C)  TempSrc: Tympanic Tympanic  Weight: 175 lb (79.4 kg) 175 lb (79.4 kg)   Physical Exam HENT:     Head: Normocephalic and atraumatic.     Mouth/Throat:     Mouth: Mucous membranes are moist.     Pharynx: Oropharynx is clear.  Eyes:     Pupils: Pupils are equal, round, and reactive to light.  Neck:     Musculoskeletal: Normal range of motion.  Cardiovascular:     Rate and Rhythm: Normal rate and regular rhythm.     Heart sounds: Normal heart sounds.  Pulmonary:     Effort: Pulmonary effort is normal.     Breath sounds: Normal breath sounds.  Abdominal:     General: Bowel sounds are normal.     Palpations: Abdomen is soft.  Skin:    General: Skin is warm and dry.  Neurological:     Mental Status: He  is alert and oriented to person, place, and time.      CMP Latest Ref Rng & Units 01/18/2019  Glucose 70 - 99 mg/dL 222(H)  BUN 8 - 23 mg/dL 18  Creatinine 0.61 - 1.24 mg/dL 1.42(H)  Sodium 135 - 145 mmol/L 133(L)  Potassium 3.5 - 5.1 mmol/L 3.8  Chloride 98 - 111 mmol/L 101  CO2 22 - 32 mmol/L 21(L)  Calcium 8.9 - 10.3 mg/dL 8.9  Total Protein 6.5 - 8.1 g/dL 8.0  Total Bilirubin 0.3 - 1.2 mg/dL 0.8  Alkaline Phos 38 - 126 U/L 68  AST 15 - 41 U/L 25  ALT 0 - 44 U/L 15   CBC Latest Ref Rng & Units 01/18/2019  WBC 4.0 - 10.5 K/uL 8.7  Hemoglobin 13.0 - 17.0 g/dL 12.9(L)  Hematocrit 39.0 - 52.0 % 39.9  Platelets 150 - 400 K/uL 185      Assessment and plan- Patient is a 78 y.o. male stage IV colon adenocarcinoma with lung metastases.   He is here for on treatment assessment prior to cycle 13 of palliative 5-FU Avastin  Counts okay to proceed with cycle 13 of 5-FU and Avastin chemotherapy today.  His blood pressure is stable and urine shows trace protein.  I will see him back in 3 weeks time with CBC CMP for cycle 14.  We will hold off on giving him Neulasta since he would like chemotherapy every 3 weeks instead of every 2 weeks.  AKI: His creatinine is mildly elevated at 1.4 and he is also tachycardic.  We will give him 1 L of IV fluids today  Sore throat: His oropharynx appears clear and there is no evidence of inflammation or separation.  No palpable cervical adenopathy.  If his sore throat persists or worsens he will give [place] a call by the end of  the week and I will refer him to ENT at that time  Chemo-induced peripheral neuropathy: He will no longer be getting oxaliplatin.  I will start him on Cymbalta 30 mg once a day for a week to increase up to 60 mg once a day.  If symptoms persist I will consider referring him to acupuncture at that time   Visit Diagnosis 1. Chemotherapy-induced peripheral neuropathy ([place])   2. Encounter for antineoplastic chemotherapy   3. Colon  adenocarcinoma ([place])   4. AKI (acute kidney injury) ([place])      Dr. Randa Evens, MD, MPH [facility] [phone] 01/18/2019 10:23 AM

## 2019-01-18 NOTE — Progress Notes (Signed)
HR 102, ok to proceed

## 2019-01-18 NOTE — Progress Notes (Signed)
Here for follow up. Pt wearing mask. Stated since last Fri has had -hoarsness to voice, no pain in throat, coughing -productive w clear mucous, no temp today or last 4 days per pt who checks temp. Neuropathy in hands and feet getting  worse over last 3 weeks per pt w new medications.

## 2019-01-20 ENCOUNTER — Inpatient Hospital Stay: Payer: Medicare Other

## 2019-01-20 ENCOUNTER — Other Ambulatory Visit: Payer: Self-pay

## 2019-01-20 DIAGNOSIS — Z5111 Encounter for antineoplastic chemotherapy: Secondary | ICD-10-CM | POA: Diagnosis not present

## 2019-01-20 MED ORDER — HEPARIN SOD (PORK) LOCK FLUSH 100 UNIT/ML IV SOLN
500.0000 [IU] | Freq: Once | INTRAVENOUS | Status: AC | PRN
  Administered 2019-01-20: 500 [IU]

## 2019-02-07 ENCOUNTER — Other Ambulatory Visit: Payer: Self-pay

## 2019-02-08 ENCOUNTER — Inpatient Hospital Stay: Payer: Medicare Other

## 2019-02-08 ENCOUNTER — Inpatient Hospital Stay (HOSPITAL_BASED_OUTPATIENT_CLINIC_OR_DEPARTMENT_OTHER): Payer: Medicare Other | Admitting: Oncology

## 2019-02-08 ENCOUNTER — Encounter: Payer: Self-pay | Admitting: Oncology

## 2019-02-08 ENCOUNTER — Other Ambulatory Visit: Payer: Self-pay

## 2019-02-08 VITALS — BP 144/83 | HR 80 | Temp 98.5°F | Resp 18 | Ht 66.0 in | Wt 175.6 lb

## 2019-02-08 DIAGNOSIS — Z803 Family history of malignant neoplasm of breast: Secondary | ICD-10-CM

## 2019-02-08 DIAGNOSIS — C7802 Secondary malignant neoplasm of left lung: Secondary | ICD-10-CM

## 2019-02-08 DIAGNOSIS — C7801 Secondary malignant neoplasm of right lung: Secondary | ICD-10-CM

## 2019-02-08 DIAGNOSIS — Z5111 Encounter for antineoplastic chemotherapy: Secondary | ICD-10-CM | POA: Diagnosis not present

## 2019-02-08 DIAGNOSIS — Z95828 Presence of other vascular implants and grafts: Secondary | ICD-10-CM

## 2019-02-08 DIAGNOSIS — C7800 Secondary malignant neoplasm of unspecified lung: Secondary | ICD-10-CM

## 2019-02-08 DIAGNOSIS — G620 Drug-induced polyneuropathy: Secondary | ICD-10-CM

## 2019-02-08 DIAGNOSIS — Z79899 Other long term (current) drug therapy: Secondary | ICD-10-CM

## 2019-02-08 DIAGNOSIS — C189 Malignant neoplasm of colon, unspecified: Secondary | ICD-10-CM

## 2019-02-08 DIAGNOSIS — C187 Malignant neoplasm of sigmoid colon: Secondary | ICD-10-CM

## 2019-02-08 LAB — PROTEIN, URINE, RANDOM: Total Protein, Urine: 25 mg/dL

## 2019-02-08 LAB — CBC WITH DIFFERENTIAL/PLATELET
Abs Immature Granulocytes: 0.01 10*3/uL (ref 0.00–0.07)
Basophils Absolute: 0.1 10*3/uL (ref 0.0–0.1)
Basophils Relative: 1 %
Eosinophils Absolute: 0.3 10*3/uL (ref 0.0–0.5)
Eosinophils Relative: 4 %
HCT: 39.3 % (ref 39.0–52.0)
Hemoglobin: 12.4 g/dL — ABNORMAL LOW (ref 13.0–17.0)
Immature Granulocytes: 0 %
Lymphocytes Relative: 38 %
Lymphs Abs: 2.3 10*3/uL (ref 0.7–4.0)
MCH: 30 pg (ref 26.0–34.0)
MCHC: 31.6 g/dL (ref 30.0–36.0)
MCV: 94.9 fL (ref 80.0–100.0)
Monocytes Absolute: 0.6 10*3/uL (ref 0.1–1.0)
Monocytes Relative: 10 %
Neutro Abs: 2.8 10*3/uL (ref 1.7–7.7)
Neutrophils Relative %: 47 %
Platelets: 180 10*3/uL (ref 150–400)
RBC: 4.14 MIL/uL — ABNORMAL LOW (ref 4.22–5.81)
RDW: 14.4 % (ref 11.5–15.5)
WBC: 6.1 10*3/uL (ref 4.0–10.5)
nRBC: 0 % (ref 0.0–0.2)

## 2019-02-08 LAB — COMPREHENSIVE METABOLIC PANEL
ALT: 15 U/L (ref 0–44)
AST: 25 U/L (ref 15–41)
Albumin: 4.2 g/dL (ref 3.5–5.0)
Alkaline Phosphatase: 60 U/L (ref 38–126)
Anion gap: 13 (ref 5–15)
BUN: 17 mg/dL (ref 8–23)
CALCIUM: 9.3 mg/dL (ref 8.9–10.3)
CO2: 20 mmol/L — ABNORMAL LOW (ref 22–32)
Chloride: 104 mmol/L (ref 98–111)
Creatinine, Ser: 1.14 mg/dL (ref 0.61–1.24)
GFR calc Af Amer: >60 mL/min (ref >60)
GFR calc non Af Amer: >60 mL/min (ref >60)
Glucose, Bld: 178 mg/dL — ABNORMAL HIGH (ref 70–99)
Potassium: 3.9 mmol/L (ref 3.5–5.1)
Sodium: 137 mmol/L (ref 135–145)
Total Bilirubin: 0.5 mg/dL (ref 0.3–1.2)
Total Protein: 7.6 g/dL (ref 6.5–8.1)

## 2019-02-08 MED ORDER — FLUOROURACIL CHEMO INJECTION 2.5 GM/50ML
400.0000 mg/m2 | Freq: Once | INTRAVENOUS | Status: AC
  Administered 2019-02-08: 800 mg via INTRAVENOUS
  Filled 2019-02-08: qty 16

## 2019-02-08 MED ORDER — SODIUM CHLORIDE 0.9 % IV SOLN
Freq: Once | INTRAVENOUS | Status: AC
  Administered 2019-02-08: 09:00:00 via INTRAVENOUS
  Filled 2019-02-08: qty 250

## 2019-02-08 MED ORDER — SODIUM CHLORIDE 0.9 % IV SOLN
2400.0000 mg/m2 | INTRAVENOUS | Status: DC
  Administered 2019-02-08: 4850 mg via INTRAVENOUS
  Filled 2019-02-08: qty 97

## 2019-02-08 MED ORDER — TRIAMCINOLONE ACETONIDE 0.025 % EX CREA: 1.0000 "application " | TOPICAL_CREAM | Freq: Every day | CUTANEOUS | 2 refills | Status: DC | PRN

## 2019-02-08 MED ORDER — LEUCOVORIN CALCIUM INJECTION 100 MG
20.0000 mg/m2 | Freq: Once | INTRAMUSCULAR | Status: AC
  Administered 2019-02-08: 40 mg via INTRAVENOUS
  Filled 2019-02-08: qty 2

## 2019-02-08 MED ORDER — SODIUM CHLORIDE 0.9 % IV SOLN
4.6000 mg/kg | Freq: Once | INTRAVENOUS | Status: AC
  Administered 2019-02-08: 400 mg via INTRAVENOUS
  Filled 2019-02-08: qty 16

## 2019-02-08 MED ORDER — DEXAMETHASONE SODIUM PHOSPHATE 10 MG/ML IJ SOLN
10.0000 mg | Freq: Once | INTRAMUSCULAR | Status: AC
  Administered 2019-02-08: 10 mg via INTRAVENOUS
  Filled 2019-02-08: qty 1

## 2019-02-08 MED ORDER — PALONOSETRON HCL INJECTION 0.25 MG/5ML
0.2500 mg | Freq: Once | INTRAVENOUS | Status: AC
  Administered 2019-02-08: 0.25 mg via INTRAVENOUS
  Filled 2019-02-08: qty 5

## 2019-02-08 MED ORDER — SODIUM CHLORIDE 0.9% FLUSH
10.0000 mL | Freq: Once | INTRAVENOUS | Status: AC
  Administered 2019-02-08: 10 mL via INTRAVENOUS
  Filled 2019-02-08: qty 10

## 2019-02-08 NOTE — Progress Notes (Signed)
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 Patient is a 77 year old male who presented with evidence of bowel obstruction on 02/06/2017. At that time he had progressive abdominal distention and no bowel movement for about one week. CT abdomen showed an apple core lesion in the descending/sigmoid colon.  2. Patient underwent Hartmann's procedure for obstructing sigmoid colon mass on 02/06/2017. Preoperative CEA was 5.0  3. 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 It is 0 out of 15 lymph nodes were positive for malignancy. Perineural and lymphovascular invasion was present. Margins were negative.pT3N0. MMR stable.  4. Given that he had high risk stage II colon cancer including he presented with obstruction and has LVI and perineural invasion- adjuvant xeloda chemotherapy was recommended for 6 months.   5.Patient had evidence of hand foot syndrome after cycle 3. Cycle 4 delayed by 1 week. Topical urea cream prescribed  6. Patient completed 8 cycles of adjuvant xeloda in October 2018. polps seen in sigmoid and decending colon but negative for malignancy  7. Repeat Ct abdomen after 8 cycles showed no metastatic disease. 6 mm lung nodule noted in RLL  8.Patient had a repeat colonoscopy in October 2018 which showed polyps in his transverse and descending colon which were taken out and were  negative for high-grade dysplasia or malignancy. Patient subsequently underwent colostomy takedown procedure by Dr. Burt Knack  9. Repeat CT chest abdomen/ pelvis showed increase in the size of previously seen lung nodules largest being 1.4 cm consistent with metastatic disease. PET/CT showed mild uptake in 2 lung nodules. Others were not hypermetabolic. No other evidence of metastatic disease  10. Repeat lung biopsy consistent with colon adenocarcinoma. Comprehensive RAS panel testing showed mutant KRAS  11.Given the slow rate of growth of his colon cancer and desire to maintain his quality of life plan was to start chemotherapy after he gets a 59-monthbreak in summer. Repeat CT chest abdomen and pelvis in August 2019 showed mild increase in the size of his lung nodules. No new lung nodules are no new sites of distant metastatic disease.FOLFOX Avastin chemotherapy started on 06/22/2018.Patient completed 12 cycles of FOLFOX Avastin chemotherapy with continued response to his lung lesions. Plan is to drop oxaliplatin and continue 5-FU Avastin chemotherapy every 3 weeks instead of every 2 weeks  Interval history- reports neuropathy in his hands and feet is mild and tolerable. He has not started taking cymbalta. Sore throat has improved and voice ha returned back to normal  ECOG PS- 1 Pain scale- 0   Review of systems- Review of Systems  Constitutional: Negative for chills, fever, malaise/fatigue and weight loss.  HENT: Negative for congestion, ear discharge and nosebleeds.   Eyes: Negative for blurred vision.  Respiratory: Negative for cough, hemoptysis, sputum production, shortness of breath and wheezing.   Cardiovascular: Negative for chest pain, palpitations, orthopnea and claudication.  Gastrointestinal: Negative for abdominal pain, blood in stool, constipation, diarrhea, heartburn, melena, nausea and vomiting.  Genitourinary: Negative for dysuria, flank pain, frequency,  hematuria and  urgency.  Musculoskeletal: Negative for back pain, joint pain and myalgias.  Skin: Negative for rash.  Neurological: Negative for dizziness, tingling, focal weakness, seizures, weakness and headaches.  Endo/Heme/Allergies: Does not bruise/bleed easily.  Psychiatric/Behavioral: Negative for depression and suicidal ideas. The patient does not have insomnia.        No Known Allergies   Past Medical History:  Diagnosis Date   Cancer ([place])    skin cancer-nose w graft,also shoulder- basal cell per pt   Cataract    r eye   Chronic kidney disease    kidney stones 20 y ago per pt   Colon cancer ([place])    Diabetes mellitus without complication ([place])    Eczema    History of kidney stones    Hyperlipidemia    Hypertension    Vertigo      Past Surgical History:  Procedure Laterality Date   CATARACT EXTRACTION Left    COLECTOMY WITH COLOSTOMY CREATION/HARTMANN PROCEDURE N/A 02/06/2017   Procedure: COLECTOMY WITH COLOSTOMY CREATION/HARTMANN PROCEDURE;  Surgeon: [name], MD;  Location: [facility] ORS;  Service: General;  Laterality: N/A;   COLON SURGERY     COLONOSCOPY WITH PROPOFOL N/A 09/04/2017   Procedure: COLONOSCOPY WITH PROPOFOL;  Surgeon: Jonathon Bellows, MD;  Location: [facility] ENDOSCOPY;  Service: Gastroenterology;  Laterality: N/A;   COLOSTOMY CLOSURE N/A 10/20/2017   Procedure: COLOSTOMY CLOSURE;  Surgeon: [name], MD;  Location: [facility] ORS;  Service: General;  Laterality: N/A;   CYSTOSCOPY     FLEXIBLE SIGMOIDOSCOPY N/A 02/06/2017   Procedure: FLEXIBLE SIGMOIDOSCOPY;  Surgeon: Christene Lye, MD;  Location: [facility] ENDOSCOPY;  Service: Endoscopy;  Laterality: N/A;   PORTACATH PLACEMENT N/A 06/17/2018   Procedure: INSERTION PORT-A-CATH, WITH FLUOROSCOPY;  Surgeon: [name], MD;  Location: [facility] ORS;  Service: General;  Laterality: N/A;    Social History   Socioeconomic History   Marital status: Married    Spouse name: Not on file   Number  of children: Not on file   Years of education: Not on file   Highest education level: Not on file  Occupational History   Not on file  Social Needs   Financial resource strain: Not on file   Food insecurity:    Worry: Not on file    Inability: Not on file   Transportation needs:    Medical: Not on file    Non-medical: Not on file  Tobacco Use   Smoking status: Never Smoker   Smokeless tobacco: Never Used  Substance and Sexual Activity   Alcohol use: No   Drug use: No   Sexual activity: Not Currently  Lifestyle   Physical activity:    Days per week: Not on file    Minutes per session: Not on file   Stress: Not on file  Relationships   Social connections:    Talks on phone: Not on file    Gets together: Not on file    Attends religious service: Not on file    Active member of club or organization: Not on file    Attends meetings of clubs or organizations: Not on file    Relationship status: Not on file   Intimate partner violence:    Fear of current or ex partner: Not on file    Emotionally abused: Not on file    Physically abused: Not on file    Forced sexual activity: Not on file  Other Topics Concern   Not on file  Social History Narrative   Not on file    Family History  Problem Relation Age of Onset   Diabetes Mother    AAA (abdominal aortic aneurysm) Mother    Coronary artery disease Mother    Diabetes Father    Coronary artery disease Father    Dementia Father    Breast cancer Sister      Current Outpatient Medications:    acetaminophen (TYLENOL) 500 MG tablet, Take 500 mg by mouth every 6 (six) hours as needed (for pain.)., Disp: , Rfl:    aspirin EC 81 MG tablet, Take 81 mg by mouth daily., Disp: , Rfl:    Dulaglutide 0.75 MG/0.5ML SOPN, Inject into the skin once a week. , Disp: , Rfl:    glimepiride (AMARYL) 2 MG tablet, Take 4 mg by mouth 2 (two) times daily before a meal. , Disp: , Rfl:    lidocaine-prilocaine (EMLA)  cream, Place a small amount over cream over port 1 hour before each treatment, cover cream with saran wrap for clothing protection, Disp: 30 g, Rfl: 1   loratadine (CLARITIN) 10 MG tablet, Take 10 mg by mouth daily., Disp: , Rfl:    losartan (COZAAR) 25 MG tablet, Take 25 mg by mouth daily., Disp: , Rfl:    metFORMIN (GLUCOPHAGE) 1000 MG tablet, Take 1,000-1,500 tablets by mouth See admin instructions. TAKE 1 TABLET (1000 MG) BY MOUTH IN THE MORNING & TAKE 1.5 TABLETS (1500 MG) BY MOUTH AT SUPPER, Disp: , Rfl:    pravastatin (PRAVACHOL) 20 MG tablet, Take 20 mg by mouth daily with supper. , Disp: , Rfl:    triamcinolone (KENALOG) 0.025 % cream, Apply 1 application topically daily as needed (FOR EZCEMA)., Disp: 30 g, Rfl: 2   DULoxetine (CYMBALTA) 30 MG capsule, Take 1 capsule (30 mg total) by mouth daily. 1 tablet daily for 7 days and then 2 tablets daily for rest of prescription (Patient not taking: Reported on 02/08/2019), Disp: 54 capsule, Rfl: 0   HYDROcodone-Acetaminophen (VICODIN) 5-300 MG TABS, Take 1 tablet by mouth every 4 (four) hours as needed. (Patient not taking: Reported on 01/18/2019), Disp: 15 each, Rfl: 0   ondansetron (ZOFRAN) 8 MG tablet, Take 1 tablet (8 mg total) by mouth 2 (two) times daily as needed for refractory nausea / vomiting. Start on day 3 after chemotherapy. (Patient not taking: Reported on 01/18/2019), Disp: 30 tablet, Rfl: 1   prochlorperazine (COMPAZINE) 10 MG tablet, Take 1 tablet (10 mg total) by mouth every 6 (six) hours as needed (Nausea or vomiting). (Patient not taking: Reported on 01/18/2019), Disp: 30 tablet, Rfl: 1 No current facility-administered medications for this visit.   Facility-Administered Medications Ordered in Other Visits:    0.9 %  sodium chloride infusion, , Intravenous, Continuous, Sindy Guadeloupe, MD, Stopped at 07/27/18 1037   0.9 %  sodium chloride infusion, , Intravenous, Once, Sindy Guadeloupe, MD   bevacizumab (AVASTIN) 450 mg in  sodium chloride 0.9 % 100 mL chemo infusion, 5 mg/kg (Treatment Plan Recorded), Intravenous, Once, Sindy Guadeloupe, MD   dexamethasone (DECADRON) injection 10 mg, 10 mg, Intravenous, Once, Sindy Guadeloupe, MD   fluorouracil (ADRUCIL) 4,850 mg in sodium chloride 0.9 % 53 mL chemo infusion, 2,400 mg/m2 (Treatment Plan Recorded), Intravenous, 1 day or 1 dose, Sindy Guadeloupe, MD   fluorouracil (ADRUCIL) chemo injection 800 mg, 400 mg/m2 (Treatment Plan Recorded), Intravenous, Once, Sindy Guadeloupe, MD   heparin lock flush 100 unit/mL, 500 Units, Intravenous, Once,  Sindy Guadeloupe, MD   leucovorin injection 40 mg, 20 mg/m2 (Treatment Plan Recorded), Intravenous, Once, Sindy Guadeloupe, MD   palonosetron (ALOXI) injection 0.25 mg, 0.25 mg, Intravenous, Once, Sindy Guadeloupe, MD   sodium chloride flush (NS) 0.9 % injection 10 mL, 10 mL, Intravenous, PRN, Sindy Guadeloupe, MD, 10 mL at 07/27/18 0920  Physical exam:  Vitals:   02/08/19 0841  BP: (!) 144/83  Pulse: 80  Resp: 18  Temp: 98.5 F (36.9 C)  TempSrc: Tympanic  Weight: 175 lb 9.6 oz (79.7 kg)  Height: _0  (1.676 m)   Physical Exam Constitutional:      General: He is not in acute distress. HENT:     Head: Normocephalic and atraumatic.  Eyes:     Pupils: Pupils are equal, round, and reactive to light.  Neck:     Musculoskeletal: Normal range of motion.  Cardiovascular:     Rate and Rhythm: Normal rate and regular rhythm.     Heart sounds: Normal heart sounds.  Pulmonary:     Effort: Pulmonary effort is normal.     Breath sounds: Normal breath sounds.  Abdominal:     General: Bowel sounds are normal.     Palpations: Abdomen is soft.  Skin:    General: Skin is warm and dry.  Neurological:     Mental Status: He is alert and oriented to person, place, and time.      CMP Latest Ref Rng & Units 02/08/2019  Glucose 70 - 99 mg/dL 178(H)  BUN 8 - 23 mg/dL 17  Creatinine 0.61 - 1.24 mg/dL 1.14  Sodium 135 - 145 mmol/L 137    Potassium 3.5 - 5.1 mmol/L 3.9  Chloride 98 - 111 mmol/L 104  CO2 22 - 32 mmol/L 20(L)  Calcium 8.9 - 10.3 mg/dL 9.3  Total Protein 6.5 - 8.1 g/dL 7.6  Total Bilirubin 0.3 - 1.2 mg/dL 0.5  Alkaline Phos 38 - 126 U/L 60  AST 15 - 41 U/L 25  ALT 0 - 44 U/L 15   CBC Latest Ref Rng & Units 02/08/2019  WBC 4.0 - 10.5 K/uL 6.1  Hemoglobin 13.0 - 17.0 g/dL 12.4(L)  Hematocrit 39.0 - 52.0 % 39.3  Platelets 150 - 400 K/uL 180     Assessment and plan- Patient is a 78 y.o. male stage IV colon adenocarcinoma with lung metastases.   He is here for on treatment assessment prior to cycle 14 of palliative 5-FU and Avastin chemotherapy  Counts okay to proceed with cycle 14 of 5-FU Avastin chemotherapy today.  His white count is 6.1 with an ANC of 2.8.  He will come back for pump disconnect on day 3 but he will not receive Neulasta at that time.  He has been getting chemotherapy every 3 weeks given that his disease is overall very slow-growing and he wishes to preserve his quality of life.  We will also drop oxaliplatin after 12 cycles due to ongoing neuropathy.  I will see him back in 3 weeks time with CBC with differential and CMP for cycle 15 of 5-FU Avastin chemotherapy and pump DC on day 3.  Patient did have mild AKI at his previous visit when his creatinine was 1.4 which has now normalized.  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 Malignant neoplasm metastatic to lung, unspecified laterality ([place])   2. 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[place])   3. Encounter for antineoplastic chemotherapy  Dr. Randa Evens, MD, MPH [facility] [phone] 02/08/2019 9:18 AM

## 2019-02-08 NOTE — Progress Notes (Signed)
Hoarseness is better, he has some phlegm that come to back of his throat but can't cough it up. He does have allergies so not sure if it is related. He would like to ask you for refill of cream

## 2019-02-10 ENCOUNTER — Inpatient Hospital Stay: Payer: Medicare Other | Attending: Oncology

## 2019-02-10 ENCOUNTER — Other Ambulatory Visit: Payer: Self-pay

## 2019-02-10 DIAGNOSIS — C189 Malignant neoplasm of colon, unspecified: Secondary | ICD-10-CM

## 2019-02-10 DIAGNOSIS — E785 Hyperlipidemia, unspecified: Secondary | ICD-10-CM | POA: Insufficient documentation

## 2019-02-10 DIAGNOSIS — Z7984 Long term (current) use of oral hypoglycemic drugs: Secondary | ICD-10-CM | POA: Diagnosis not present

## 2019-02-10 DIAGNOSIS — Z5189 Encounter for other specified aftercare: Secondary | ICD-10-CM | POA: Insufficient documentation

## 2019-02-10 DIAGNOSIS — N189 Chronic kidney disease, unspecified: Secondary | ICD-10-CM | POA: Insufficient documentation

## 2019-02-10 DIAGNOSIS — C7800 Secondary malignant neoplasm of unspecified lung: Secondary | ICD-10-CM | POA: Insufficient documentation

## 2019-02-10 DIAGNOSIS — C187 Malignant neoplasm of sigmoid colon: Secondary | ICD-10-CM | POA: Diagnosis not present

## 2019-02-10 DIAGNOSIS — Z933 Colostomy status: Secondary | ICD-10-CM | POA: Insufficient documentation

## 2019-02-10 DIAGNOSIS — Z79899 Other long term (current) drug therapy: Secondary | ICD-10-CM | POA: Diagnosis not present

## 2019-02-10 DIAGNOSIS — E119 Type 2 diabetes mellitus without complications: Secondary | ICD-10-CM | POA: Insufficient documentation

## 2019-02-10 DIAGNOSIS — I10 Essential (primary) hypertension: Secondary | ICD-10-CM | POA: Insufficient documentation

## 2019-02-10 MED ORDER — SODIUM CHLORIDE 0.9% FLUSH
10.0000 mL | INTRAVENOUS | Status: DC | PRN
  Administered 2019-02-10: 10 mL
  Filled 2019-02-10: qty 10

## 2019-02-10 MED ORDER — HEPARIN SOD (PORK) LOCK FLUSH 100 UNIT/ML IV SOLN
500.0000 [IU] | Freq: Once | INTRAVENOUS | Status: AC | PRN
  Administered 2019-02-10: 14:00:00 500 [IU]

## 2019-02-10 MED ORDER — HEPARIN SOD (PORK) LOCK FLUSH 100 UNIT/ML IV SOLN
INTRAVENOUS | Status: AC
  Filled 2019-02-10: qty 5

## 2019-02-28 ENCOUNTER — Other Ambulatory Visit: Payer: Self-pay

## 2019-03-01 ENCOUNTER — Inpatient Hospital Stay: Payer: Medicare Other | Attending: Oncology | Admitting: Oncology

## 2019-03-01 ENCOUNTER — Other Ambulatory Visit: Payer: Self-pay

## 2019-03-01 ENCOUNTER — Encounter: Payer: Self-pay | Admitting: Oncology

## 2019-03-01 ENCOUNTER — Inpatient Hospital Stay: Payer: Medicare Other

## 2019-03-01 ENCOUNTER — Inpatient Hospital Stay: Payer: Medicare Other | Attending: Oncology

## 2019-03-01 VITALS — BP 151/80 | HR 80 | Temp 97.7°F | Resp 20 | Ht 66.0 in | Wt 176.0 lb

## 2019-03-01 DIAGNOSIS — Z8249 Family history of ischemic heart disease and other diseases of the circulatory system: Secondary | ICD-10-CM | POA: Diagnosis not present

## 2019-03-01 DIAGNOSIS — Z5112 Encounter for antineoplastic immunotherapy: Secondary | ICD-10-CM | POA: Diagnosis not present

## 2019-03-01 DIAGNOSIS — Z79899 Other long term (current) drug therapy: Secondary | ICD-10-CM

## 2019-03-01 DIAGNOSIS — Z803 Family history of malignant neoplasm of breast: Secondary | ICD-10-CM | POA: Insufficient documentation

## 2019-03-01 DIAGNOSIS — I10 Essential (primary) hypertension: Secondary | ICD-10-CM | POA: Diagnosis not present

## 2019-03-01 DIAGNOSIS — Z95828 Presence of other vascular implants and grafts: Secondary | ICD-10-CM

## 2019-03-01 DIAGNOSIS — Z833 Family history of diabetes mellitus: Secondary | ICD-10-CM | POA: Insufficient documentation

## 2019-03-01 DIAGNOSIS — C189 Malignant neoplasm of colon, unspecified: Secondary | ICD-10-CM

## 2019-03-01 DIAGNOSIS — E119 Type 2 diabetes mellitus without complications: Secondary | ICD-10-CM | POA: Insufficient documentation

## 2019-03-01 DIAGNOSIS — C7800 Secondary malignant neoplasm of unspecified lung: Secondary | ICD-10-CM | POA: Insufficient documentation

## 2019-03-01 DIAGNOSIS — G629 Polyneuropathy, unspecified: Secondary | ICD-10-CM | POA: Diagnosis not present

## 2019-03-01 DIAGNOSIS — N189 Chronic kidney disease, unspecified: Secondary | ICD-10-CM

## 2019-03-01 DIAGNOSIS — Z933 Colostomy status: Secondary | ICD-10-CM

## 2019-03-01 DIAGNOSIS — Z87442 Personal history of urinary calculi: Secondary | ICD-10-CM | POA: Insufficient documentation

## 2019-03-01 DIAGNOSIS — Z7984 Long term (current) use of oral hypoglycemic drugs: Secondary | ICD-10-CM

## 2019-03-01 DIAGNOSIS — C187 Malignant neoplasm of sigmoid colon: Secondary | ICD-10-CM | POA: Insufficient documentation

## 2019-03-01 DIAGNOSIS — Z5111 Encounter for antineoplastic chemotherapy: Secondary | ICD-10-CM

## 2019-03-01 DIAGNOSIS — E785 Hyperlipidemia, unspecified: Secondary | ICD-10-CM

## 2019-03-01 LAB — COMPREHENSIVE METABOLIC PANEL
ALT: 14 U/L (ref 0–44)
AST: 21 U/L (ref 15–41)
Albumin: 4.1 g/dL (ref 3.5–5.0)
Alkaline Phosphatase: 57 U/L (ref 38–126)
Anion gap: 12 (ref 5–15)
BUN: 14 mg/dL (ref 8–23)
CO2: 21 mmol/L — ABNORMAL LOW (ref 22–32)
Calcium: 8.8 mg/dL — ABNORMAL LOW (ref 8.9–10.3)
Chloride: 104 mmol/L (ref 98–111)
Creatinine, Ser: 1.2 mg/dL (ref 0.61–1.24)
GFR calc Af Amer: >60 mL/min (ref >60)
GFR calc non Af Amer: 57 mL/min — ABNORMAL LOW (ref >60)
Glucose, Bld: 247 mg/dL — ABNORMAL HIGH (ref 70–99)
Potassium: 4.2 mmol/L (ref 3.5–5.1)
Sodium: 137 mmol/L (ref 135–145)
Total Bilirubin: 0.7 mg/dL (ref 0.3–1.2)
Total Protein: 7.5 g/dL (ref 6.5–8.1)

## 2019-03-01 LAB — CBC WITH DIFFERENTIAL/PLATELET
Abs Immature Granulocytes: 0 10*3/uL (ref 0.00–0.07)
Basophils Absolute: 0 10*3/uL (ref 0.0–0.1)
Basophils Relative: 1 %
Eosinophils Absolute: 0.2 10*3/uL (ref 0.0–0.5)
Eosinophils Relative: 4 %
HCT: 39.7 % (ref 39.0–52.0)
Hemoglobin: 12.8 g/dL — ABNORMAL LOW (ref 13.0–17.0)
Immature Granulocytes: 0 %
Lymphocytes Relative: 48 %
Lymphs Abs: 2 10*3/uL (ref 0.7–4.0)
MCH: 30.2 pg (ref 26.0–34.0)
MCHC: 32.2 g/dL (ref 30.0–36.0)
MCV: 93.6 fL (ref 80.0–100.0)
Monocytes Absolute: 0.5 10*3/uL (ref 0.1–1.0)
Monocytes Relative: 12 %
Neutro Abs: 1.5 10*3/uL — ABNORMAL LOW (ref 1.7–7.7)
Neutrophils Relative %: 35 %
Platelets: 210 10*3/uL (ref 150–400)
RBC: 4.24 MIL/uL (ref 4.22–5.81)
RDW: 13.9 % (ref 11.5–15.5)
WBC: 4.2 10*3/uL (ref 4.0–10.5)
nRBC: 0 % (ref 0.0–0.2)

## 2019-03-01 LAB — PROTEIN, URINE, RANDOM: Total Protein, Urine: 10 mg/dL

## 2019-03-01 MED ORDER — SODIUM CHLORIDE 0.9 % IV SOLN
2400.0000 mg/m2 | INTRAVENOUS | Status: DC
  Administered 2019-03-01: 4850 mg via INTRAVENOUS
  Filled 2019-03-01: qty 97

## 2019-03-01 MED ORDER — DEXAMETHASONE SODIUM PHOSPHATE 10 MG/ML IJ SOLN
10.0000 mg | Freq: Once | INTRAMUSCULAR | Status: AC
  Administered 2019-03-01: 11:00:00 10 mg via INTRAVENOUS
  Filled 2019-03-01: qty 1

## 2019-03-01 MED ORDER — FLUOROURACIL CHEMO INJECTION 2.5 GM/50ML
400.0000 mg/m2 | Freq: Once | INTRAVENOUS | Status: AC
  Administered 2019-03-01: 800 mg via INTRAVENOUS
  Filled 2019-03-01: qty 16

## 2019-03-01 MED ORDER — SODIUM CHLORIDE 0.9 % IV SOLN
Freq: Once | INTRAVENOUS | Status: AC
  Administered 2019-03-01: 11:00:00 via INTRAVENOUS
  Filled 2019-03-01: qty 250

## 2019-03-01 MED ORDER — LEUCOVORIN CALCIUM INJECTION 100 MG
20.0000 mg/m2 | Freq: Once | INTRAMUSCULAR | Status: AC
  Administered 2019-03-01: 40 mg via INTRAVENOUS
  Filled 2019-03-01: qty 2

## 2019-03-01 MED ORDER — SODIUM CHLORIDE 0.9% FLUSH
10.0000 mL | Freq: Once | INTRAVENOUS | Status: AC
  Administered 2019-03-01: 10 mL via INTRAVENOUS
  Filled 2019-03-01: qty 10

## 2019-03-01 MED ORDER — DEXTROSE 5 % IV SOLN
Freq: Once | INTRAVENOUS | Status: DC
  Filled 2019-03-01: qty 250

## 2019-03-01 MED ORDER — SODIUM CHLORIDE 0.9 % IV SOLN
400.0000 mg | Freq: Once | INTRAVENOUS | Status: AC
  Administered 2019-03-01: 400 mg via INTRAVENOUS
  Filled 2019-03-01: qty 16

## 2019-03-01 MED ORDER — PALONOSETRON HCL INJECTION 0.25 MG/5ML
0.2500 mg | Freq: Once | INTRAVENOUS | Status: AC
  Administered 2019-03-01: 0.25 mg via INTRAVENOUS
  Filled 2019-03-01: qty 5

## 2019-03-01 NOTE — Progress Notes (Signed)
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 Patient is a 78 year old male who presented with evidence of bowel obstruction on 02/06/2017. At that time he had progressive abdominal distention and no bowel movement for about one week. CT abdomen showed an apple core lesion in the descending/sigmoid colon.  2. Patient underwent Hartmann's procedure for obstructing sigmoid colon mass on 02/06/2017. Preoperative CEA was 5.0  3. 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 It is 0 out of 15 lymph nodes were positive for malignancy. Perineural and lymphovascular invasion was present. Margins were negative.pT3N0. MMR stable.  4. Given that he had high risk stage II colon cancer including he presented with obstruction and has LVI and perineural invasion- adjuvant xeloda chemotherapy was recommended for 6 months.   5.Patient had evidence of hand foot syndrome after cycle 3. Cycle 4 delayed by 1 week. Topical urea cream prescribed  6. Patient completed 8 cycles of adjuvant xeloda in October 2018. polps seen in sigmoid and decending colon but negative for malignancy  7. Repeat Ct abdomen after 8 cycles showed no metastatic disease. 6 mm lung nodule noted in RLL  8.Patient had a repeat colonoscopy in October 2018 which showed polyps in his transverse and descending colon which were taken out and were  negative for high-grade dysplasia or malignancy. Patient subsequently underwent colostomy takedown procedure by Dr. Burt Knack  9. Repeat CT chest abdomen/ pelvis showed increase in the size of previously seen lung nodules largest being 1.4 cm consistent with metastatic disease. PET/CT showed mild uptake in 2 lung nodules. Others were not hypermetabolic. No other evidence of metastatic disease  10. Repeat lung biopsy consistent with colon adenocarcinoma. Comprehensive RAS panel testing showed mutant KRAS  11.Given the slow rate of growth of his colon cancer and desire to maintain his quality of life plan was to start chemotherapy after he gets a 26-monthbreak in summer. Repeat CT chest abdomen and pelvis in August 2019 showed mild increase in the size of his lung nodules. No new lung nodules are no new sites of distant metastatic disease.FOLFOX Avastin chemotherapy started on 06/22/2018.Patient completed 12 cycles of FOLFOX Avastin chemotherapy with continued response to his lung lesions. 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  Denies any other complaints at this time.  Energy levels are going well abdominal pain nausea vomiting  ECOG PS- 1 Pain scale- 0 Opioid associated constipation- no  Review of systems- Review of Systems  Constitutional: Negative for chills, fever, malaise/fatigue and weight loss.  HENT: Negative for congestion, ear discharge and nosebleeds.   Eyes: Negative for blurred vision.  Respiratory: Negative for cough, hemoptysis, sputum production, shortness of breath and wheezing.   Cardiovascular: Negative for chest pain, palpitations, orthopnea and claudication.  Gastrointestinal: Negative for abdominal pain, blood in stool, constipation, diarrhea, heartburn, melena, nausea and vomiting.  Genitourinary: Negative for dysuria, flank pain,  frequency, hematuria and urgency.  Musculoskeletal: Negative for back pain, joint pain and myalgias.  Skin: Negative for rash.  Neurological: Negative for dizziness, tingling, focal weakness, seizures, weakness and headaches.  Endo/Heme/Allergies: Does not bruise/bleed easily.  Psychiatric/Behavioral: Negative for depression and suicidal ideas. The patient does not have insomnia.       No Known Allergies   Past Medical History:  Diagnosis Date  . Cancer ([place])    skin cancer-nose w graft,also shoulder- basal cell per pt  . Cataract    r eye  . Chronic kidney disease    kidney stones 20 y ago per pt  . Colon cancer ([place])   . Diabetes mellitus without complication ([place])   . Eczema   . History of kidney stones   . Hyperlipidemia   . Hypertension   . Vertigo      Past Surgical History:  Procedure Laterality Date  . CATARACT EXTRACTION Left   . COLECTOMY WITH COLOSTOMY CREATION/HARTMANN PROCEDURE N/A 02/06/2017   Procedure: COLECTOMY WITH COLOSTOMY CREATION/HARTMANN PROCEDURE;  Surgeon: [name], MD;  Location: [facility] ORS;  Service: General;  Laterality: N/A;  . COLON SURGERY    . COLONOSCOPY WITH PROPOFOL N/A 09/04/2017   Procedure: COLONOSCOPY WITH PROPOFOL;  Surgeon: Jonathon Bellows, MD;  Location: [facility] ENDOSCOPY;  Service: Gastroenterology;  Laterality: N/A;  . COLOSTOMY CLOSURE N/A 10/20/2017   Procedure: COLOSTOMY CLOSURE;  Surgeon: [name], MD;  Location: [facility] ORS;  Service: General;  Laterality: N/A;  . CYSTOSCOPY    . FLEXIBLE SIGMOIDOSCOPY N/A 02/06/2017   Procedure: FLEXIBLE SIGMOIDOSCOPY;  Surgeon: Christene Lye, MD;  Location: [facility] ENDOSCOPY;  Service: Endoscopy;  Laterality: N/A;  . PORTACATH PLACEMENT N/A 06/17/2018   Procedure: INSERTION PORT-A-CATH, WITH FLUOROSCOPY;  Surgeon: [name], MD;  Location: [facility] ORS;  Service: General;  Laterality: N/A;    Social History   Socioeconomic History  . Marital status: Married    Spouse name:  Not on file  . Number of children: Not on file  . Years of education: Not on file  . Highest education level: Not on file  Occupational History  . Not on file  Social Needs  . Financial resource strain: Not on file  . Food insecurity:    Worry: Not on file    Inability: Not on file  . Transportation needs:    Medical: Not on file    Non-medical: Not on file  Tobacco Use  . Smoking status: Never Smoker  . Smokeless tobacco: Never Used  Substance and Sexual Activity  . Alcohol use: No  . Drug use: No  . Sexual activity: Not Currently  Lifestyle  . Physical activity:    Days per week: Not on file    Minutes per session: Not on file  . Stress: Not on file  Relationships  . Social connections:    Talks on phone: Not on file    Gets together: Not on file    Attends religious service: Not on file    Active member of club or organization: Not on file    Attends meetings of clubs or organizations: Not on file    Relationship status: Not on file  . Intimate partner violence:    Fear of current or ex partner: Not on file    Emotionally abused: Not on file    Physically abused: Not on file    Forced sexual activity: Not on file  Other Topics Concern  . Not on file  Social History Narrative  . Not on file    Family History  Problem Relation Age of Onset  . Diabetes Mother   . AAA (abdominal aortic aneurysm) Mother   . Coronary artery disease Mother   . Diabetes Father   . Coronary artery disease Father   . Dementia Father   . Breast cancer Sister      Current Outpatient Medications:  .  acetaminophen (TYLENOL) 500 MG tablet, Take 500 mg by mouth every 6 (six) hours as needed (for pain.)., Disp: , Rfl:  .  aspirin EC 81 MG tablet, Take 81 mg by mouth daily., Disp: , Rfl:  .  Dulaglutide 0.75 MG/0.5ML SOPN, Inject into the skin once a week. , Disp: , Rfl:  .  glimepiride (AMARYL) 2 MG tablet, Take 4 mg by mouth 2 (two) times daily before a meal. , Disp: , Rfl:  .   lidocaine-prilocaine (EMLA) cream, Place a small amount over cream over port 1 hour before each treatment, cover cream with saran wrap for clothing protection, Disp: 30 g, Rfl: 1 .  loratadine (CLARITIN) 10 MG tablet, Take 10 mg by mouth daily., Disp: , Rfl:  .  losartan (COZAAR) 25 MG tablet, Take 25 mg by mouth daily., Disp: , Rfl:  .  metFORMIN (GLUCOPHAGE) 1000 MG tablet, Take 1,000-1,500 tablets by mouth See admin instructions. TAKE 1 TABLET (1000 MG) BY MOUTH IN THE MORNING & TAKE 1.5 TABLETS (1500 MG) BY MOUTH AT SUPPER, Disp: , Rfl:  .  pravastatin (PRAVACHOL) 20 MG tablet, Take 20 mg by mouth daily with supper. , Disp: , Rfl:  .  triamcinolone (KENALOG) 0.025 % cream, Apply 1 application topically daily as needed (FOR EZCEMA)., Disp: 30 g, Rfl: 2 .  DULoxetine (CYMBALTA) 30 MG capsule, Take 1 capsule (30 mg total) by mouth daily. 1 tablet daily for 7 days and then 2 tablets daily for rest of prescription (Patient not taking: Reported on 03/01/2019), Disp: 54 capsule, Rfl: 0 .  HYDROcodone-Acetaminophen (VICODIN) 5-300 MG TABS, Take 1 tablet by mouth every 4 (four) hours as needed. (Patient not taking: Reported on 01/18/2019), Disp: 15 each, Rfl: 0 .  ondansetron (ZOFRAN) 8 MG tablet, Take 1 tablet (8 mg total) by mouth 2 (two) times daily as needed for refractory nausea / vomiting. Start on day 3 after chemotherapy. (Patient not taking: Reported on 01/18/2019), Disp: 30 tablet, Rfl: 1 .  prochlorperazine (COMPAZINE) 10 MG tablet, Take 1 tablet (10 mg total) by mouth every 6 (six) hours as needed (Nausea or vomiting). (Patient not taking: Reported on 01/18/2019), Disp: 30 tablet, Rfl: 1 No current facility-administered medications for this visit.   Facility-Administered Medications Ordered in Other Visits:  .  0.9 %  sodium chloride infusion, , Intravenous, Continuous, Sindy Guadeloupe, MD, Stopped at 07/27/18 1037 .  dextrose 5 % solution, , Intravenous, Once, Sindy Guadeloupe, MD .  fluorouracil  (ADRUCIL) 4,850 mg in sodium chloride 0.9 % 53 mL chemo infusion, 2,400 mg/m2 (Treatment Plan Recorded), Intravenous, 1 day or 1 dose, Sindy Guadeloupe, MD, 4,850 mg at 03/01/19 1206 .  heparin lock flush 100 unit/mL, 500 Units, Intravenous, Once, Sindy Guadeloupe, MD .  sodium chloride flush (NS) 0.9 % injection 10 mL, 10 mL, Intravenous, PRN, Sindy Guadeloupe, MD, 10 mL at 07/27/18 0920  Physical exam:  Vitals:   03/01/19 0946  BP: (!) 151/80  Pulse: 80  Resp: 20  Temp: 97.7 F (36.5 C)  TempSrc: Tympanic  Weight: 176 lb (79.8 kg)  Height: '5\' 6"'  (1.676 m)   Physical Exam Constitutional:      General: He is not in acute distress. HENT:     Head: Normocephalic and atraumatic.  Eyes:     Pupils: Pupils are equal, round, and reactive to light.  Neck:     Musculoskeletal: Normal range of motion.  Cardiovascular:     Rate and Rhythm: Normal rate and regular rhythm.     Heart sounds: Normal heart sounds.  Pulmonary:     Effort: Pulmonary effort is normal.     Breath sounds: Normal breath sounds.  Abdominal:     General: Bowel sounds are normal.     Palpations: Abdomen is soft.  Skin:    General: Skin is warm and dry.  Neurological:     Mental Status: He is alert and oriented to person, place, and time.      CMP Latest Ref Rng & Units 03/01/2019  Glucose 70 - 99 mg/dL 247(H)  BUN 8 - 23 mg/dL 14  Creatinine 0.61 - 1.24 mg/dL 1.20  Sodium 135 - 145 mmol/L 137  Potassium 3.5 - 5.1 mmol/L 4.2  Chloride 98 - 111 mmol/L 104  CO2 22 - 32 mmol/L 21(L)  Calcium 8.9 - 10.3 mg/dL 8.8(L)  Total Protein 6.5 - 8.1 g/dL 7.5  Total Bilirubin 0.3 - 1.2 mg/dL 0.7  Alkaline Phos 38 - 126 U/L 57  AST 15 - 41 U/L 21  ALT 0 - 44 U/L 14   CBC Latest Ref Rng & Units 03/01/2019  WBC 4.0 - 10.5 K/uL 4.2  Hemoglobin 13.0 - 17.0 g/dL 12.8(L)  Hematocrit 39.0 - 52.0 % 39.7  Platelets 150 - 400 K/uL 210    Assessment and plan- Patient is a 78 y.o. male stage IV colon adenocarcinoma with lung  metastases.He is here for on treatment assessment prior to cycle 15 of palliative 5-FU and Avastin chemotherapy  Counts okay to proceed with cycle 15 of 5 chemotherapy today.  His ANC is mildly low at 1.5 today.  He will therefore receive udencya on day 3 of pump disconnect.  I will see him back in 3 weeks time with CBC with differential, CMP and urine protein for cycle 16 of 5-FU Avastin chemotherapy.  Plan to repeat scans after his next cycle.  Urine protein is trace and he has been keeping a tab of his blood pressure at home which has been mostly running in the 140s.  Continue to monitor   Visit Diagnosis 1. Encounter for antineoplastic chemotherapy   2. Encounter for monoclonal antibody treatment for malignancy   3. Malignant neoplasm metastatic to lung, unspecified laterality ([place])   4. 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[place])      Dr. Randa Evens, MD, MPH [facility] [phone] 03/01/2019 12:54 PM

## 2019-03-02 NOTE — Progress Notes (Signed)
Contacted MD to see if wanted to switch patient from Udenyca to Onpro to lessen patient visits during [place].  Patient to remain on Udenyca per MD.

## 2019-03-03 ENCOUNTER — Inpatient Hospital Stay: Payer: Medicare Other

## 2019-03-03 ENCOUNTER — Other Ambulatory Visit: Payer: Self-pay

## 2019-03-03 VITALS — BP 148/79 | HR 65

## 2019-03-03 DIAGNOSIS — C189 Malignant neoplasm of colon, unspecified: Secondary | ICD-10-CM

## 2019-03-03 DIAGNOSIS — C187 Malignant neoplasm of sigmoid colon: Secondary | ICD-10-CM | POA: Diagnosis not present

## 2019-03-03 DIAGNOSIS — C7800 Secondary malignant neoplasm of unspecified lung: Secondary | ICD-10-CM

## 2019-03-03 MED ORDER — HEPARIN SOD (PORK) LOCK FLUSH 100 UNIT/ML IV SOLN
500.0000 [IU] | Freq: Once | INTRAVENOUS | Status: AC | PRN
  Administered 2019-03-03: 500 [IU]
  Filled 2019-03-03: qty 5

## 2019-03-03 MED ORDER — PEGFILGRASTIM-CBQV 6 MG/0.6ML ~~LOC~~ SOSY
6.0000 mg | PREFILLED_SYRINGE | Freq: Once | SUBCUTANEOUS | Status: AC
  Administered 2019-03-03: 14:00:00 6 mg via SUBCUTANEOUS
  Filled 2019-03-03: qty 0.6

## 2019-03-22 ENCOUNTER — Other Ambulatory Visit: Payer: Self-pay

## 2019-03-22 ENCOUNTER — Inpatient Hospital Stay: Payer: Medicare Other

## 2019-03-22 ENCOUNTER — Inpatient Hospital Stay: Payer: Medicare Other | Attending: Oncology

## 2019-03-22 ENCOUNTER — Inpatient Hospital Stay (HOSPITAL_BASED_OUTPATIENT_CLINIC_OR_DEPARTMENT_OTHER): Payer: Medicare Other | Admitting: Oncology

## 2019-03-22 ENCOUNTER — Encounter: Payer: Self-pay | Admitting: Oncology

## 2019-03-22 VITALS — BP 131/77 | HR 87 | Temp 96.3°F | Resp 18 | Ht 66.0 in | Wt 174.9 lb

## 2019-03-22 DIAGNOSIS — C187 Malignant neoplasm of sigmoid colon: Secondary | ICD-10-CM | POA: Diagnosis not present

## 2019-03-22 DIAGNOSIS — C7800 Secondary malignant neoplasm of unspecified lung: Secondary | ICD-10-CM | POA: Diagnosis not present

## 2019-03-22 DIAGNOSIS — Z5112 Encounter for antineoplastic immunotherapy: Secondary | ICD-10-CM | POA: Insufficient documentation

## 2019-03-22 DIAGNOSIS — C189 Malignant neoplasm of colon, unspecified: Secondary | ICD-10-CM

## 2019-03-22 DIAGNOSIS — Z79899 Other long term (current) drug therapy: Secondary | ICD-10-CM | POA: Diagnosis not present

## 2019-03-22 DIAGNOSIS — Z9049 Acquired absence of other specified parts of digestive tract: Secondary | ICD-10-CM

## 2019-03-22 DIAGNOSIS — Z5111 Encounter for antineoplastic chemotherapy: Secondary | ICD-10-CM | POA: Diagnosis present

## 2019-03-22 DIAGNOSIS — Z95828 Presence of other vascular implants and grafts: Secondary | ICD-10-CM

## 2019-03-22 LAB — CBC WITH DIFFERENTIAL/PLATELET
Abs Immature Granulocytes: 0.02 10*3/uL (ref 0.00–0.07)
Basophils Absolute: 0.1 10*3/uL (ref 0.0–0.1)
Basophils Relative: 1 %
Eosinophils Absolute: 0.4 10*3/uL (ref 0.0–0.5)
Eosinophils Relative: 4 %
HCT: 40.4 % (ref 39.0–52.0)
Hemoglobin: 13.3 g/dL (ref 13.0–17.0)
Immature Granulocytes: 0 %
Lymphocytes Relative: 28 %
Lymphs Abs: 2.7 10*3/uL (ref 0.7–4.0)
MCH: 30.6 pg (ref 26.0–34.0)
MCHC: 32.9 g/dL (ref 30.0–36.0)
MCV: 92.9 fL (ref 80.0–100.0)
Monocytes Absolute: 0.5 10*3/uL (ref 0.1–1.0)
Monocytes Relative: 6 %
Neutro Abs: 6 10*3/uL (ref 1.7–7.7)
Neutrophils Relative %: 61 %
Platelets: 231 10*3/uL (ref 150–400)
RBC: 4.35 MIL/uL (ref 4.22–5.81)
RDW: 14.1 % (ref 11.5–15.5)
WBC: 9.8 10*3/uL (ref 4.0–10.5)
nRBC: 0 % (ref 0.0–0.2)

## 2019-03-22 LAB — COMPREHENSIVE METABOLIC PANEL
ALT: 12 U/L (ref 0–44)
AST: 26 U/L (ref 15–41)
Albumin: 4.4 g/dL (ref 3.5–5.0)
Alkaline Phosphatase: 68 U/L (ref 38–126)
Anion gap: 13 (ref 5–15)
BUN: 15 mg/dL (ref 8–23)
CO2: 20 mmol/L — ABNORMAL LOW (ref 22–32)
Calcium: 9.7 mg/dL (ref 8.9–10.3)
Chloride: 104 mmol/L (ref 98–111)
Creatinine, Ser: 1.18 mg/dL (ref 0.61–1.24)
GFR calc Af Amer: >60 mL/min (ref >60)
GFR calc non Af Amer: 58 mL/min — ABNORMAL LOW (ref >60)
Glucose, Bld: 167 mg/dL — ABNORMAL HIGH (ref 70–99)
Potassium: 4.3 mmol/L (ref 3.5–5.1)
Sodium: 137 mmol/L (ref 135–145)
Total Bilirubin: 0.5 mg/dL (ref 0.3–1.2)
Total Protein: 7.8 g/dL (ref 6.5–8.1)

## 2019-03-22 MED ORDER — SODIUM CHLORIDE 0.9 % IV SOLN
INTRAVENOUS | Status: DC
  Administered 2019-03-22: 10:00:00 via INTRAVENOUS
  Filled 2019-03-22: qty 250

## 2019-03-22 MED ORDER — DEXTROSE 5 % IV SOLN
Freq: Once | INTRAVENOUS | Status: AC
  Administered 2019-03-22: 11:00:00 via INTRAVENOUS
  Filled 2019-03-22: qty 250

## 2019-03-22 MED ORDER — SODIUM CHLORIDE 0.9% FLUSH
10.0000 mL | Freq: Once | INTRAVENOUS | Status: AC
  Administered 2019-03-22: 09:00:00 10 mL via INTRAVENOUS
  Filled 2019-03-22: qty 10

## 2019-03-22 MED ORDER — PALONOSETRON HCL INJECTION 0.25 MG/5ML
0.2500 mg | Freq: Once | INTRAVENOUS | Status: AC
  Administered 2019-03-22: 0.25 mg via INTRAVENOUS
  Filled 2019-03-22: qty 5

## 2019-03-22 MED ORDER — FLUOROURACIL CHEMO INJECTION 2.5 GM/50ML
400.0000 mg/m2 | Freq: Once | INTRAVENOUS | Status: AC
  Administered 2019-03-22: 800 mg via INTRAVENOUS
  Filled 2019-03-22: qty 16

## 2019-03-22 MED ORDER — DEXAMETHASONE SODIUM PHOSPHATE 10 MG/ML IJ SOLN
10.0000 mg | Freq: Once | INTRAMUSCULAR | Status: AC
  Administered 2019-03-22: 10 mg via INTRAVENOUS
  Filled 2019-03-22: qty 1

## 2019-03-22 MED ORDER — SODIUM CHLORIDE 0.9 % IV SOLN
2400.0000 mg/m2 | INTRAVENOUS | Status: DC
  Administered 2019-03-22: 4850 mg via INTRAVENOUS
  Filled 2019-03-22: qty 97

## 2019-03-22 MED ORDER — SODIUM CHLORIDE 0.9 % IV SOLN
400.0000 mg | Freq: Once | INTRAVENOUS | Status: AC
  Administered 2019-03-22: 400 mg via INTRAVENOUS
  Filled 2019-03-22: qty 16

## 2019-03-22 MED ORDER — LEUCOVORIN CALCIUM INJECTION 100 MG
20.0000 mg/m2 | Freq: Once | INTRAMUSCULAR | Status: AC
  Administered 2019-03-22: 40 mg via INTRAVENOUS
  Filled 2019-03-22: qty 2

## 2019-03-22 NOTE — Progress Notes (Signed)
Pt doing well no complaints-patient takes stool softner 2 x week

## 2019-03-22 NOTE — Progress Notes (Signed)
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 Patient is a 78 year old male who presented with evidence of bowel obstruction on 02/06/2017. At that time he had progressive abdominal distention and no bowel movement for about one week. CT abdomen showed an apple core lesion in the descending/sigmoid colon.  2. Patient underwent Hartmann's procedure for obstructing sigmoid colon mass on 02/06/2017. Preoperative CEA was 5.0  3. 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 It is 0 out of 15 lymph nodes were positive for malignancy. Perineural and lymphovascular invasion was present. Margins were negative.pT3N0. MMR stable.  4. Given that he had high risk stage II colon cancer including he presented with obstruction and has LVI and perineural invasion- adjuvant xeloda chemotherapy was recommended for 6 months.   5.Patient had evidence of hand foot syndrome after cycle 3. Cycle 4 delayed by 1 week. Topical urea cream prescribed  6. Patient completed 8 cycles of adjuvant xeloda in October 2018. polps seen in sigmoid and decending colon but negative for malignancy  7. Repeat Ct abdomen after 8 cycles showed no metastatic disease. 6 mm lung nodule noted in RLL  8.Patient had a repeat colonoscopy in October 2018 which showed polyps in his transverse and descending colon which were taken out and were negative for  high-grade dysplasia or malignancy. Patient subsequently underwent colostomy takedown procedure by Dr. Burt Knack  9. Repeat CT chest abdomen/ pelvis showed increase in the size of previously seen lung nodules largest being 1.4 cm consistent with metastatic disease. PET/CT showed mild uptake in 2 lung nodules. Others were not hypermetabolic. No other evidence of metastatic disease  10. Repeat lung biopsy consistent with colon adenocarcinoma. Comprehensive RAS panel testing showed mutant KRAS  11.Given the slow rate of growth of his colon cancer and desire to maintain his quality of life plan was to start chemotherapy after he gets a 44-monthbreak in summer. Repeat CT chest abdomen and pelvis in August 2019 showed mild increase in the size of his lung nodules. No new lung nodules are no new sites of distant metastatic disease.FOLFOX Avastin chemotherapy started on 06/22/2018.Patient completed 12 cycles of FOLFOX Avastin chemotherapy with continued response to his lung lesions. Plan is to drop oxaliplatin and continue 5-FU Avastin chemotherapy every 3 weeks instead of every 2 weeks   Interval history-feels well and denies any complaints at this time.  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  HENT: Negative for congestion, ear discharge and nosebleeds.   Eyes: Negative for blurred vision.  Respiratory: Negative for cough, hemoptysis, sputum production, shortness of breath and wheezing.   Cardiovascular: Negative for chest pain, palpitations, orthopnea and claudication.  Gastrointestinal: Negative for abdominal pain, blood in stool, constipation, diarrhea, heartburn, melena, nausea and vomiting.  Genitourinary: Negative for dysuria, flank pain, frequency, hematuria and urgency.  Musculoskeletal: Negative for back pain, joint pain and myalgias.  Skin: Negative for rash.  Neurological: Negative for dizziness, tingling, focal weakness, seizures, weakness and headaches.  Endo/Heme/Allergies: Does not bruise/bleed easily.  Psychiatric/Behavioral: Negative for depression and suicidal ideas. The patient does not have insomnia.       No Known Allergies   Past Medical History:  Diagnosis Date  . Cancer ([place])    skin cancer-nose w graft,also shoulder- basal cell per pt  . Cataract    r eye  . Chronic kidney disease    kidney stones 20 y ago per pt  . Colon cancer ([place])   . Diabetes mellitus without complication ([place])   . Eczema   . History of kidney stones   . Hyperlipidemia   . Hypertension   . Vertigo      Past Surgical History:  Procedure Laterality Date  . CATARACT EXTRACTION Left   . COLECTOMY WITH COLOSTOMY CREATION/HARTMANN PROCEDURE N/A 02/06/2017   Procedure: COLECTOMY WITH COLOSTOMY CREATION/HARTMANN PROCEDURE;  Surgeon: [name], MD;  Location: [facility] ORS;  Service: General;  Laterality: N/A;  . COLON SURGERY    . COLONOSCOPY WITH PROPOFOL N/A 09/04/2017   Procedure: COLONOSCOPY WITH PROPOFOL;  Surgeon: Jonathon Bellows, MD;  Location: [facility] ENDOSCOPY;  Service: Gastroenterology;  Laterality: N/A;  . COLOSTOMY CLOSURE N/A 10/20/2017   Procedure: COLOSTOMY CLOSURE;  Surgeon: [name], MD;  Location: [facility] ORS;  Service: General;  Laterality: N/A;  . CYSTOSCOPY    . FLEXIBLE SIGMOIDOSCOPY N/A 02/06/2017   Procedure: FLEXIBLE SIGMOIDOSCOPY;  Surgeon: Christene Lye, MD;  Location: [facility] ENDOSCOPY;  Service: Endoscopy;  Laterality: N/A;  . PORTACATH PLACEMENT N/A 06/17/2018   Procedure: INSERTION PORT-A-CATH, WITH FLUOROSCOPY;  Surgeon: [name], MD;  Location: [facility] ORS;  Service: General;  Laterality: N/A;    Social History   Socioeconomic History  . Marital status: Married    Spouse name: Not on file  . Number of  children: Not on file  . Years of education: Not on file  . Highest education level: Not on file  Occupational History  . Not on file  Social Needs  . Financial resource strain: Not on file  . Food insecurity:    Worry: Not on file    Inability: Not on file  . Transportation needs:    Medical: Not on file    Non-medical: Not on file  Tobacco Use  . Smoking status: Never Smoker  . Smokeless tobacco: Never Used  Substance and Sexual Activity  . Alcohol use: No  . Drug use: No  . Sexual activity: Not Currently  Lifestyle  . Physical activity:    Days per week: Not on file    Minutes per session: Not on file  . Stress: Not on file  Relationships  . Social connections:    Talks on phone: Not on file    Gets together: Not on file    Attends religious service: Not on file    Active member of club or organization: Not on file    Attends meetings of clubs or organizations: Not on file    Relationship status: Not on file  . Intimate partner violence:    Fear of current or ex partner: Not on file    Emotionally abused: Not on file    Physically abused: Not on file    Forced sexual activity: Not on file  Other Topics Concern  . Not on file  Social History Narrative  .  Not on file    Family History  Problem Relation Age of Onset  . Diabetes Mother   . AAA (abdominal aortic aneurysm) Mother   . Coronary artery disease Mother   . Diabetes Father   . Coronary artery disease Father   . Dementia Father   . Breast cancer Sister      Current Outpatient Medications:  .  acetaminophen (TYLENOL) 500 MG tablet, Take 500 mg by mouth every 6 (six) hours as needed (for pain.)., Disp: , Rfl:  .  aspirin EC 81 MG tablet, Take 81 mg by mouth daily., Disp: , Rfl:  .  Docusate Sodium (COLACE PO), Take 1 Dose by mouth as needed., Disp: , Rfl:  .  Dulaglutide 0.75 MG/0.5ML SOPN, Inject into the skin once a week. , Disp: , Rfl:  .  glimepiride (AMARYL) 2 MG tablet, Take 4 mg by mouth 2  (two) times daily before a meal. , Disp: , Rfl:  .  lidocaine-prilocaine (EMLA) cream, Place a small amount over cream over port 1 hour before each treatment, cover cream with saran wrap for clothing protection, Disp: 30 g, Rfl: 1 .  loratadine (CLARITIN) 10 MG tablet, Take 10 mg by mouth daily., Disp: , Rfl:  .  losartan (COZAAR) 25 MG tablet, Take 25 mg by mouth daily., Disp: , Rfl:  .  metFORMIN (GLUCOPHAGE) 1000 MG tablet, Take 1,000-1,500 tablets by mouth See admin instructions. TAKE 1 TABLET (1000 MG) BY MOUTH IN THE MORNING & TAKE 1.5 TABLETS (1500 MG) BY MOUTH AT SUPPER, Disp: , Rfl:  .  pravastatin (PRAVACHOL) 20 MG tablet, Take 20 mg by mouth daily with supper. , Disp: , Rfl:  .  triamcinolone (KENALOG) 0.025 % cream, Apply 1 application topically daily as needed (FOR EZCEMA)., Disp: 30 g, Rfl: 2 .  HYDROcodone-Acetaminophen (VICODIN) 5-300 MG TABS, Take 1 tablet by mouth every 4 (four) hours as needed. (Patient not taking: Reported on 01/18/2019), Disp: 15 each, Rfl: 0 .  ondansetron (ZOFRAN) 8 MG tablet, Take 1 tablet (8 mg total) by mouth 2 (two) times daily as needed for refractory nausea / vomiting. Start on day 3 after chemotherapy. (Patient not taking: Reported on 01/18/2019), Disp: 30 tablet, Rfl: 1 .  prochlorperazine (COMPAZINE) 10 MG tablet, Take 1 tablet (10 mg total) by mouth every 6 (six) hours as needed (Nausea or vomiting). (Patient not taking: Reported on 01/18/2019), Disp: 30 tablet, Rfl: 1 No current facility-administered medications for this visit.   Facility-Administered Medications Ordered in Other Visits:  .  0.9 %  sodium chloride infusion, , Intravenous, Continuous, Sindy Guadeloupe, MD, Stopped at 07/27/18 1037 .  0.9 %  sodium chloride infusion, , Intravenous, Continuous, Sindy Guadeloupe, MD, Last Rate: 10 mL/hr at 03/22/19 0941 .  bevacizumab (AVASTIN) 400 mg in sodium chloride 0.9 % 100 mL chemo infusion, 400 mg, Intravenous, Once, Sindy Guadeloupe, MD, Stopped at  03/22/19 1018 .  dextrose 5 % solution, , Intravenous, Once, Sindy Guadeloupe, MD .  fluorouracil (ADRUCIL) 4,850 mg in sodium chloride 0.9 % 53 mL chemo infusion, 2,400 mg/m2 (Treatment Plan Recorded), Intravenous, 1 day or 1 dose, Sindy Guadeloupe, MD .  fluorouracil (ADRUCIL) chemo injection 800 mg, 400 mg/m2 (Treatment Plan Recorded), Intravenous, Once, Sindy Guadeloupe, MD .  heparin lock flush 100 unit/mL, 500 Units, Intravenous, Once, Sindy Guadeloupe, MD .  leucovorin injection 40 mg, 20 mg/m2 (Treatment Plan Recorded), Intravenous, Once, Sindy Guadeloupe, MD .  sodium  chloride flush (NS) 0.9 % injection 10 mL, 10 mL, Intravenous, PRN, Sindy Guadeloupe, MD, 10 mL at 07/27/18 0920  Physical exam:  Vitals:   03/22/19 0854  BP: 131/77  Pulse: 87  Resp: 18  Temp: (!) 96.3 F (35.7 C)  TempSrc: Tympanic  Weight: 174 lb 14.4 oz (79.3 kg)  Height: '5\' 6"'  (1.676 m)   Physical Exam Constitutional:      General: He is not in acute distress. HENT:     Head: Normocephalic and atraumatic.  Eyes:     Pupils: Pupils are equal, round, and reactive to light.  Neck:     Musculoskeletal: Normal range of motion.  Cardiovascular:     Rate and Rhythm: Normal rate and regular rhythm.     Heart sounds: Normal heart sounds.  Pulmonary:     Effort: Pulmonary effort is normal.     Breath sounds: Normal breath sounds.  Abdominal:     General: Bowel sounds are normal.     Palpations: Abdomen is soft.  Skin:    General: Skin is warm and dry.  Neurological:     Mental Status: He is alert and oriented to person, place, and time.      CMP Latest Ref Rng & Units 03/22/2019  Glucose 70 - 99 mg/dL 167(H)  BUN 8 - 23 mg/dL 15  Creatinine 0.61 - 1.24 mg/dL 1.18  Sodium 135 - 145 mmol/L 137  Potassium 3.5 - 5.1 mmol/L 4.3  Chloride 98 - 111 mmol/L 104  CO2 22 - 32 mmol/L 20(L)  Calcium 8.9 - 10.3 mg/dL 9.7  Total Protein 6.5 - 8.1 g/dL 7.8  Total Bilirubin 0.3 - 1.2 mg/dL 0.5  Alkaline Phos 38 - 126 U/L  68  AST 15 - 41 U/L 26  ALT 0 - 44 U/L 12   CBC Latest Ref Rng & Units 03/22/2019  WBC 4.0 - 10.5 K/uL 9.8  Hemoglobin 13.0 - 17.0 g/dL 13.3  Hematocrit 39.0 - 52.0 % 40.4  Platelets 150 - 400 K/uL 231      Assessment and plan- Patient is a 78 y.o. male stage IV colon adenocarcinoma with lung metastases.He is here for on treatment assessment prior to cycle 16 of 5-FU Avastin chemotherapy  Counts okay to proceed with cycle 16 of 5-FU Avastin chemotherapy today.  His blood pressure is well controlled.  Urine protein is currently pending.  He will not require udencya on day 3 of pump disconnect.  I will see him back in 3 weeks time for a video visit prior to cycle 17 of 5-FU Avastin chemotherapy.  He will get CBC with differential, CMP and urine protein on the day of her cycle 17.  We will plan to get repeat CT chest abdomen and pelvis after cycle 17   Visit Diagnosis 1. Encounter for antineoplastic chemotherapy   2. Malignant neoplasm metastatic to lung, unspecified laterality ([place])   3. 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[place])      Dr. Randa Evens, MD, MPH [facility] [phone] 03/22/2019 10:13 AM

## 2019-03-24 ENCOUNTER — Inpatient Hospital Stay: Payer: Medicare Other

## 2019-03-24 ENCOUNTER — Other Ambulatory Visit: Payer: Self-pay

## 2019-03-24 VITALS — BP 140/74 | HR 88 | Temp 96.8°F | Resp 18

## 2019-03-24 DIAGNOSIS — C7800 Secondary malignant neoplasm of unspecified lung: Secondary | ICD-10-CM

## 2019-03-24 DIAGNOSIS — Z5112 Encounter for antineoplastic immunotherapy: Secondary | ICD-10-CM | POA: Diagnosis not present

## 2019-03-24 DIAGNOSIS — C189 Malignant neoplasm of colon, unspecified: Secondary | ICD-10-CM

## 2019-03-24 MED ORDER — HEPARIN SOD (PORK) LOCK FLUSH 100 UNIT/ML IV SOLN
500.0000 [IU] | Freq: Once | INTRAVENOUS | Status: AC | PRN
  Administered 2019-03-24: 500 [IU]

## 2019-04-05 ENCOUNTER — Telehealth: Payer: Self-pay | Admitting: *Deleted

## 2019-04-05 ENCOUNTER — Other Ambulatory Visit: Payer: Self-pay | Admitting: *Deleted

## 2019-04-05 MED ORDER — GABAPENTIN 300 MG PO CAPS: 300.0000 mg | ORAL_CAPSULE | ORAL | 0 refills | Status: DC

## 2019-04-05 NOTE — Telephone Encounter (Signed)
Pt called me to say that his numbness and tingling still bothering him and it was feet and now ankles and up the shins to knees. He would like the medicine that md spoke about before for numbness. I told him it would be sent to pharmacy. Dr [name] wants him to have neurontin and it was sent in

## 2019-04-08 ENCOUNTER — Telehealth: Payer: Self-pay | Admitting: *Deleted

## 2019-04-08 NOTE — Telephone Encounter (Signed)
Pt called today and said he was not sure how to tak neurontin when it is twice a day. I told him that he should take 1 in am and 1 at night. Then when he gets to three times a day to take 1 in am and 1 at lunch to early afternoon and then 1 at night. He says he will take it with breakfast and lunch at bedtime. I told him he can take it with food or without and making sure it is at least 4 hours in between the doses. He says his meals are at tleast 4 hours in between so he will take first 2 with meals and the last one at nighttime

## 2019-04-11 ENCOUNTER — Inpatient Hospital Stay: Payer: Medicare Other | Admitting: Oncology

## 2019-04-12 ENCOUNTER — Inpatient Hospital Stay: Payer: Medicare Other

## 2019-04-12 ENCOUNTER — Other Ambulatory Visit: Payer: Medicare Other

## 2019-04-12 ENCOUNTER — Ambulatory Visit: Payer: Medicare Other

## 2019-04-12 ENCOUNTER — Encounter: Payer: Self-pay | Admitting: Oncology

## 2019-04-12 ENCOUNTER — Inpatient Hospital Stay: Payer: Medicare Other | Attending: Oncology | Admitting: Oncology

## 2019-04-12 ENCOUNTER — Other Ambulatory Visit: Payer: Self-pay

## 2019-04-12 ENCOUNTER — Inpatient Hospital Stay: Payer: Medicare Other | Attending: Oncology

## 2019-04-12 VITALS — BP 118/67 | HR 73 | Temp 96.6°F | Resp 18 | Ht 66.0 in | Wt 173.1 lb

## 2019-04-12 DIAGNOSIS — C187 Malignant neoplasm of sigmoid colon: Secondary | ICD-10-CM | POA: Diagnosis not present

## 2019-04-12 DIAGNOSIS — G620 Drug-induced polyneuropathy: Secondary | ICD-10-CM | POA: Insufficient documentation

## 2019-04-12 DIAGNOSIS — Z7982 Long term (current) use of aspirin: Secondary | ICD-10-CM | POA: Insufficient documentation

## 2019-04-12 DIAGNOSIS — Z7984 Long term (current) use of oral hypoglycemic drugs: Secondary | ICD-10-CM | POA: Diagnosis not present

## 2019-04-12 DIAGNOSIS — Z79899 Other long term (current) drug therapy: Secondary | ICD-10-CM | POA: Diagnosis not present

## 2019-04-12 DIAGNOSIS — Z5111 Encounter for antineoplastic chemotherapy: Secondary | ICD-10-CM | POA: Insufficient documentation

## 2019-04-12 DIAGNOSIS — E119 Type 2 diabetes mellitus without complications: Secondary | ICD-10-CM | POA: Diagnosis not present

## 2019-04-12 DIAGNOSIS — I10 Essential (primary) hypertension: Secondary | ICD-10-CM | POA: Insufficient documentation

## 2019-04-12 DIAGNOSIS — Z5189 Encounter for other specified aftercare: Secondary | ICD-10-CM | POA: Insufficient documentation

## 2019-04-12 DIAGNOSIS — Z85828 Personal history of other malignant neoplasm of skin: Secondary | ICD-10-CM | POA: Insufficient documentation

## 2019-04-12 DIAGNOSIS — C7800 Secondary malignant neoplasm of unspecified lung: Secondary | ICD-10-CM | POA: Diagnosis not present

## 2019-04-12 DIAGNOSIS — Z5112 Encounter for antineoplastic immunotherapy: Secondary | ICD-10-CM | POA: Insufficient documentation

## 2019-04-12 DIAGNOSIS — N189 Chronic kidney disease, unspecified: Secondary | ICD-10-CM | POA: Insufficient documentation

## 2019-04-12 DIAGNOSIS — Z933 Colostomy status: Secondary | ICD-10-CM | POA: Diagnosis not present

## 2019-04-12 DIAGNOSIS — I129 Hypertensive chronic kidney disease with stage 1 through stage 4 chronic kidney disease, or unspecified chronic kidney disease: Secondary | ICD-10-CM

## 2019-04-12 DIAGNOSIS — C189 Malignant neoplasm of colon, unspecified: Secondary | ICD-10-CM

## 2019-04-12 LAB — COMPREHENSIVE METABOLIC PANEL
ALT: 16 U/L (ref 0–44)
AST: 27 U/L (ref 15–41)
Albumin: 4.3 g/dL (ref 3.5–5.0)
Alkaline Phosphatase: 49 U/L (ref 38–126)
Anion gap: 13 (ref 5–15)
BUN: 20 mg/dL (ref 8–23)
CO2: 21 mmol/L — ABNORMAL LOW (ref 22–32)
Calcium: 9.5 mg/dL (ref 8.9–10.3)
Chloride: 106 mmol/L (ref 98–111)
Creatinine, Ser: 1.3 mg/dL — ABNORMAL HIGH (ref 0.61–1.24)
GFR calc Af Amer: >60 mL/min (ref >60)
GFR calc non Af Amer: 52 mL/min — ABNORMAL LOW (ref >60)
Glucose, Bld: 169 mg/dL — ABNORMAL HIGH (ref 70–99)
Potassium: 4.4 mmol/L (ref 3.5–5.1)
Sodium: 140 mmol/L (ref 135–145)
Total Bilirubin: 0.6 mg/dL (ref 0.3–1.2)
Total Protein: 7.6 g/dL (ref 6.5–8.1)

## 2019-04-12 LAB — PROTEIN, URINE, RANDOM: Total Protein, Urine: 15 mg/dL

## 2019-04-12 LAB — CBC WITH DIFFERENTIAL/PLATELET
Abs Immature Granulocytes: 0.01 10*3/uL (ref 0.00–0.07)
Basophils Absolute: 0.1 10*3/uL (ref 0.0–0.1)
Basophils Relative: 1 %
Eosinophils Absolute: 0.4 10*3/uL (ref 0.0–0.5)
Eosinophils Relative: 6 %
HCT: 39.2 % (ref 39.0–52.0)
Hemoglobin: 12.3 g/dL — ABNORMAL LOW (ref 13.0–17.0)
Immature Granulocytes: 0 %
Lymphocytes Relative: 36 %
Lymphs Abs: 2.4 10*3/uL (ref 0.7–4.0)
MCH: 29.4 pg (ref 26.0–34.0)
MCHC: 31.4 g/dL (ref 30.0–36.0)
MCV: 93.8 fL (ref 80.0–100.0)
Monocytes Absolute: 0.7 10*3/uL (ref 0.1–1.0)
Monocytes Relative: 11 %
Neutro Abs: 3 10*3/uL (ref 1.7–7.7)
Neutrophils Relative %: 46 %
Platelets: 204 10*3/uL (ref 150–400)
RBC: 4.18 MIL/uL — ABNORMAL LOW (ref 4.22–5.81)
RDW: 14.3 % (ref 11.5–15.5)
WBC: 6.6 10*3/uL (ref 4.0–10.5)
nRBC: 0 % (ref 0.0–0.2)

## 2019-04-12 MED ORDER — SODIUM CHLORIDE 0.9% FLUSH
10.0000 mL | Freq: Once | INTRAVENOUS | Status: AC
  Administered 2019-04-12: 09:00:00 10 mL via INTRAVENOUS
  Filled 2019-04-12: qty 10

## 2019-04-12 MED ORDER — LEUCOVORIN CALCIUM INJECTION 100 MG
20.0000 mg/m2 | Freq: Once | INTRAMUSCULAR | Status: AC
  Administered 2019-04-12: 40 mg via INTRAVENOUS
  Filled 2019-04-12: qty 2

## 2019-04-12 MED ORDER — FLUOROURACIL CHEMO INJECTION 2.5 GM/50ML
400.0000 mg/m2 | Freq: Once | INTRAVENOUS | Status: AC
  Administered 2019-04-12: 800 mg via INTRAVENOUS
  Filled 2019-04-12: qty 16

## 2019-04-12 MED ORDER — DEXTROSE 5 % IV SOLN
Freq: Once | INTRAVENOUS | Status: DC
  Filled 2019-04-12: qty 250

## 2019-04-12 MED ORDER — DEXAMETHASONE SODIUM PHOSPHATE 10 MG/ML IJ SOLN
10.0000 mg | Freq: Once | INTRAMUSCULAR | Status: AC
  Administered 2019-04-12: 10 mg via INTRAVENOUS
  Filled 2019-04-12: qty 1

## 2019-04-12 MED ORDER — PALONOSETRON HCL INJECTION 0.25 MG/5ML
0.2500 mg | Freq: Once | INTRAVENOUS | Status: AC
  Administered 2019-04-12: 0.25 mg via INTRAVENOUS
  Filled 2019-04-12: qty 5

## 2019-04-12 MED ORDER — SODIUM CHLORIDE 0.9 % IV SOLN
400.0000 mg | Freq: Once | INTRAVENOUS | Status: AC
  Administered 2019-04-12: 400 mg via INTRAVENOUS
  Filled 2019-04-12: qty 16

## 2019-04-12 MED ORDER — SODIUM CHLORIDE 0.9 % IV SOLN
2400.0000 mg/m2 | INTRAVENOUS | Status: DC
  Administered 2019-04-12: 4850 mg via INTRAVENOUS
  Filled 2019-04-12: qty 97

## 2019-04-12 MED ORDER — SODIUM CHLORIDE 0.9 % IV SOLN
Freq: Once | INTRAVENOUS | Status: AC
  Administered 2019-04-12: 10:00:00 via INTRAVENOUS
  Filled 2019-04-12: qty 250

## 2019-04-12 NOTE — Progress Notes (Signed)
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 Patient is a 78 year old male who presented with evidence of bowel obstruction on 02/06/2017. At that time he had progressive abdominal distention and no bowel movement for about one week. CT abdomen showed an apple core lesion in the descending/sigmoid colon.  2. Patient underwent Hartmann's procedure for obstructing sigmoid colon mass on 02/06/2017. Preoperative CEA was 5.0  3. 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 It is 0 out of 15 lymph nodes were positive for malignancy. Perineural and lymphovascular invasion was present. Margins were negative.pT3N0. MMR stable.  4. Given that he had high risk stage II colon cancer including he presented with obstruction and has LVI and perineural invasion- adjuvant xeloda chemotherapy was recommended for 6 months.   5.Patient had evidence of hand foot syndrome after cycle 3. Cycle 4 delayed by 1 week. Topical urea cream prescribed  6. Patient completed 8 cycles of adjuvant xeloda in October 2018. polps seen in sigmoid and decending colon but negative for malignancy  7. Repeat Ct abdomen after 8 cycles showed no metastatic disease. 6 mm lung nodule noted in RLL  8.Patient had a repeat colonoscopy in October 2018 which showed polyps in his transverse and descending colon which were taken out and were negative for  high-grade dysplasia or malignancy. Patient subsequently underwent colostomy takedown procedure by Dr. Burt Knack  9. Repeat CT chest abdomen/ pelvis showed increase in the size of previously seen lung nodules largest being 1.4 cm consistent with metastatic disease. PET/CT showed mild uptake in 2 lung nodules. Others were not hypermetabolic. No other evidence of metastatic disease  10. Repeat lung biopsy consistent with colon adenocarcinoma. Comprehensive RAS panel testing showed mutant KRAS  11.Given the slow rate of growth of his colon cancer and desire to maintain his quality of life plan was to start chemotherapy after he gets a 66-monthbreak in summer. Repeat CT chest abdomen and pelvis in August 2019 showed mild increase in the size of his lung nodules. No new lung nodules are no new sites of distant metastatic disease.FOLFOX Avastin chemotherapy started on 06/22/2018.Patient completed 12 cycles of FOLFOX Avastin chemotherapy with continued response to his lung lesions. Plan is to drop oxaliplatin and continue 5-FU Avastin chemotherapy every 3 weeks instead of every 2 weeks   Interval history-reports the neuropathy symptoms are improved after he has started taking gabapentin.  He will be increasing his dose to 3 times a day soon.  Otherwise he is doing well and denies other complaints at this time ECOG PS- 0 Pain scale- 0 Opioid associated constipation- no  Review of systems- Review of Systems  Constitutional: Negative for chills, fever, malaise/fatigue and weight loss.  HENT: Negative for congestion, ear discharge and nosebleeds.   Eyes: Negative for blurred vision.  Respiratory: Negative for cough, hemoptysis, sputum production, shortness of breath and wheezing.   Cardiovascular: Negative for chest pain, palpitations, orthopnea and claudication.  Gastrointestinal: Negative for abdominal pain, blood in stool, constipation, diarrhea, heartburn, melena, nausea  and vomiting.   Genitourinary: Negative for dysuria, flank pain, frequency, hematuria and urgency.  Musculoskeletal: Negative for back pain, joint pain and myalgias.  Skin: Negative for rash.  Neurological: Positive for sensory change (Peripheral neuropathy). Negative for dizziness, tingling, tremors, focal weakness, seizures, weakness and headaches.  Endo/Heme/Allergies: Does not bruise/bleed easily.  Psychiatric/Behavioral: Negative for depression and suicidal ideas. The patient does not have insomnia.       No Known Allergies   Past Medical History:  Diagnosis Date  . Cancer ([place])    skin cancer-nose w graft,also shoulder- basal cell per pt  . Cataract    r eye  . Chronic kidney disease    kidney stones 20 y ago per pt  . Colon cancer ([place])   . Diabetes mellitus without complication ([place])   . Eczema   . History of kidney stones   . Hyperlipidemia   . Hypertension   . Vertigo      Past Surgical History:  Procedure Laterality Date  . CATARACT EXTRACTION Left   . COLECTOMY WITH COLOSTOMY CREATION/HARTMANN PROCEDURE N/A 02/06/2017   Procedure: COLECTOMY WITH COLOSTOMY CREATION/HARTMANN PROCEDURE;  Surgeon: [name], MD;  Location: [facility] ORS;  Service: General;  Laterality: N/A;  . COLON SURGERY    . COLONOSCOPY WITH PROPOFOL N/A 09/04/2017   Procedure: COLONOSCOPY WITH PROPOFOL;  Surgeon: Jonathon Bellows, MD;  Location: [facility] ENDOSCOPY;  Service: Gastroenterology;  Laterality: N/A;  . COLOSTOMY CLOSURE N/A 10/20/2017   Procedure: COLOSTOMY CLOSURE;  Surgeon: [name], MD;  Location: [facility] ORS;  Service: General;  Laterality: N/A;  . CYSTOSCOPY    . FLEXIBLE SIGMOIDOSCOPY N/A 02/06/2017   Procedure: FLEXIBLE SIGMOIDOSCOPY;  Surgeon: Christene Lye, MD;  Location: [facility] ENDOSCOPY;  Service: Endoscopy;  Laterality: N/A;  . PORTACATH PLACEMENT N/A 06/17/2018   Procedure: INSERTION PORT-A-CATH, WITH FLUOROSCOPY;  Surgeon: [name], MD;  Location: [facility] ORS;  Service:  General;  Laterality: N/A;    Social History   Socioeconomic History  . Marital status: Married    Spouse name: Not on file  . Number of children: Not on file  . Years of education: Not on file  . Highest education level: Not on file  Occupational History  . Not on file  Social Needs  . Financial resource strain: Not on file  . Food insecurity:    Worry: Not on file    Inability: Not on file  . Transportation needs:    Medical: Not on file    Non-medical: Not on file  Tobacco Use  . Smoking status: Never Smoker  . Smokeless tobacco: Never Used  Substance and Sexual Activity  . Alcohol use: No  . Drug use: No  . Sexual activity: Not Currently  Lifestyle  . Physical activity:    Days per week: Not on file    Minutes per session: Not on file  . Stress: Not on file  Relationships  . Social connections:    Talks on phone: Not on file    Gets together: Not on file    Attends religious service: Not on file    Active member of club or organization: Not on file    Attends meetings of clubs or organizations: Not on file    Relationship status: Not on file  . Intimate partner violence:    Fear of current or ex partner: Not on file    Emotionally abused: Not on file    Physically abused: Not on file  Forced sexual activity: Not on file  Other Topics Concern  . Not on file  Social History Narrative  . Not on file    Family History  Problem Relation Age of Onset  . Diabetes Mother   . AAA (abdominal aortic aneurysm) Mother   . Coronary artery disease Mother   . Diabetes Father   . Coronary artery disease Father   . Dementia Father   . Breast cancer Sister      Current Outpatient Medications:  .  acetaminophen (TYLENOL) 500 MG tablet, Take 500 mg by mouth every 6 (six) hours as needed (for pain.)., Disp: , Rfl:  .  aspirin EC 81 MG tablet, Take 81 mg by mouth daily., Disp: , Rfl:  .  Docusate Sodium (COLACE PO), Take 1 Dose by mouth as needed., Disp: , Rfl:  .   Dulaglutide 0.75 MG/0.5ML SOPN, Inject into the skin once a week. , Disp: , Rfl:  .  gabapentin (NEURONTIN) 300 MG capsule, Take 1 capsule (300 mg total) by mouth as directed. 1 capsule at night x 4 days , then 1 capsule bid x 4 days and then 1 capsule tid for rest of month, Disp: 78 capsule, Rfl: 0 .  glimepiride (AMARYL) 2 MG tablet, Take 2 mg by mouth 2 (two) times daily before a meal. , Disp: , Rfl:  .  HYDROcodone-Acetaminophen (VICODIN) 5-300 MG TABS, Take 1 tablet by mouth every 4 (four) hours as needed., Disp: 15 each, Rfl: 0 .  lidocaine-prilocaine (EMLA) cream, Place a small amount over cream over port 1 hour before each treatment, cover cream with saran wrap for clothing protection, Disp: 30 g, Rfl: 1 .  loratadine (CLARITIN) 10 MG tablet, Take 10 mg by mouth daily., Disp: , Rfl:  .  losartan (COZAAR) 25 MG tablet, Take 25 mg by mouth daily., Disp: , Rfl:  .  metFORMIN (GLUCOPHAGE) 1000 MG tablet, Take 1,000-1,500 tablets by mouth See admin instructions. TAKE 1 TABLET (1000 MG) BY MOUTH IN THE MORNING & TAKE 1.5 TABLETS (1500 MG) BY MOUTH AT SUPPER, Disp: , Rfl:  .  pravastatin (PRAVACHOL) 20 MG tablet, Take 20 mg by mouth daily with supper. , Disp: , Rfl:  .  triamcinolone (KENALOG) 0.025 % cream, Apply 1 application topically daily as needed (FOR EZCEMA)., Disp: 30 g, Rfl: 2 .  ondansetron (ZOFRAN) 8 MG tablet, Take 1 tablet (8 mg total) by mouth 2 (two) times daily as needed for refractory nausea / vomiting. Start on day 3 after chemotherapy. (Patient not taking: Reported on 04/12/2019), Disp: 30 tablet, Rfl: 1 .  prochlorperazine (COMPAZINE) 10 MG tablet, Take 1 tablet (10 mg total) by mouth every 6 (six) hours as needed (Nausea or vomiting). (Patient not taking: Reported on 04/12/2019), Disp: 30 tablet, Rfl: 1 No current facility-administered medications for this visit.   Facility-Administered Medications Ordered in Other Visits:  .  0.9 %  sodium chloride infusion, , Intravenous,  Continuous, Sindy Guadeloupe, MD, Stopped at 07/27/18 1037 .  fluorouracil (ADRUCIL) 4,850 mg in sodium chloride 0.9 % 53 mL chemo infusion, 2,400 mg/m2 (Treatment Plan Recorded), Intravenous, 1 day or 1 dose, Sindy Guadeloupe, MD .  fluorouracil (ADRUCIL) chemo injection 800 mg, 400 mg/m2 (Treatment Plan Recorded), Intravenous, Once, Sindy Guadeloupe, MD .  heparin lock flush 100 unit/mL, 500 Units, Intravenous, Once, Sindy Guadeloupe, MD .  leucovorin injection 40 mg, 20 mg/m2 (Treatment Plan Recorded), Intravenous, Once, Sindy Guadeloupe, MD .  sodium chloride  flush (NS) 0.9 % injection 10 mL, 10 mL, Intravenous, PRN, Sindy Guadeloupe, MD, 10 mL at 07/27/18 0920  Physical exam:  Vitals:   04/12/19 0853  BP: 118/67  Pulse: 73  Resp: 18  Temp: (!) 96.6 F (35.9 C)  TempSrc: Tympanic  Weight: 173 lb 1.6 oz (78.5 kg)  Height: '5\' 6"'  (1.676 m)   Physical Exam Constitutional:      General: He is not in acute distress. HENT:     Head: Normocephalic and atraumatic.  Eyes:     Pupils: Pupils are equal, round, and reactive to light.  Neck:     Musculoskeletal: Normal range of motion.  Cardiovascular:     Rate and Rhythm: Normal rate and regular rhythm.     Heart sounds: Normal heart sounds.  Pulmonary:     Effort: Pulmonary effort is normal.     Breath sounds: Normal breath sounds.  Abdominal:     General: Bowel sounds are normal.     Palpations: Abdomen is soft.  Skin:    General: Skin is warm and dry.  Neurological:     Mental Status: He is alert and oriented to person, place, and time.      CMP Latest Ref Rng & Units 04/12/2019  Glucose 70 - 99 mg/dL 169(H)  BUN 8 - 23 mg/dL 20  Creatinine 0.61 - 1.24 mg/dL 1.30(H)  Sodium 135 - 145 mmol/L 140  Potassium 3.5 - 5.1 mmol/L 4.4  Chloride 98 - 111 mmol/L 106  CO2 22 - 32 mmol/L 21(L)  Calcium 8.9 - 10.3 mg/dL 9.5  Total Protein 6.5 - 8.1 g/dL 7.6  Total Bilirubin 0.3 - 1.2 mg/dL 0.6  Alkaline Phos 38 - 126 U/L 49  AST 15 - 41 U/L 27   ALT 0 - 44 U/L 16   CBC Latest Ref Rng & Units 04/12/2019  WBC 4.0 - 10.5 K/uL 6.6  Hemoglobin 13.0 - 17.0 g/dL 12.3(L)  Hematocrit 39.0 - 52.0 % 39.2  Platelets 150 - 400 K/uL 204     Assessment and plan- Patient is a 78 y.o. 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  He is here for on treatment assessment prior to cycle 17 of 5-FU Avastin chemotherapy  Counts okay to proceed with cycle 17 of chemotherapy today.  His ANC is 3 today and he does not require Neulasta on day 3 of pump disconnect.  Urine protein is trace and blood pressure is stable.  I will see him back in 3 weeks time with CBC with differential, CMP and urine protein for cycle 19.  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  Symptoms are well controlled with gabapentin.  Continue to monitor   Visit Diagnosis 1. 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[place])   2. Encounter for antineoplastic chemotherapy   3. Encounter for monoclonal antibody treatment for malignancy   4. Chemotherapy-induced peripheral neuropathy ([place])      Dr. Randa Evens, MD, MPH [facility] [phone] 04/12/2019 11:02 AM

## 2019-04-12 NOTE — Progress Notes (Signed)
Ankles feels better to him. Knees some discomfort. Fingers tingling in 1/2 fingers. Eating good.

## 2019-04-14 ENCOUNTER — Other Ambulatory Visit: Payer: Self-pay

## 2019-04-14 ENCOUNTER — Inpatient Hospital Stay: Payer: Medicare Other

## 2019-04-14 DIAGNOSIS — C189 Malignant neoplasm of colon, unspecified: Secondary | ICD-10-CM

## 2019-04-14 DIAGNOSIS — C7800 Secondary malignant neoplasm of unspecified lung: Secondary | ICD-10-CM

## 2019-04-14 DIAGNOSIS — Z5112 Encounter for antineoplastic immunotherapy: Secondary | ICD-10-CM | POA: Diagnosis not present

## 2019-04-14 MED ORDER — SODIUM CHLORIDE 0.9% FLUSH
10.0000 mL | INTRAVENOUS | Status: DC | PRN
  Filled 2019-04-14: qty 10

## 2019-04-14 MED ORDER — SODIUM CHLORIDE 0.9% FLUSH
10.0000 mL | INTRAVENOUS | Status: DC | PRN
  Administered 2019-04-14: 10 mL via INTRAVENOUS
  Filled 2019-04-14: qty 10

## 2019-04-14 MED ORDER — HEPARIN SOD (PORK) LOCK FLUSH 100 UNIT/ML IV SOLN: 500.0000 [IU] | Freq: Once | INTRAVENOUS | Status: AC | PRN

## 2019-04-14 MED ORDER — HEPARIN SOD (PORK) LOCK FLUSH 100 UNIT/ML IV SOLN
500.0000 [IU] | Freq: Once | INTRAVENOUS | Status: AC
  Administered 2019-04-14: 500 [IU] via INTRAVENOUS
  Filled 2019-04-14: qty 5

## 2019-04-26 ENCOUNTER — Ambulatory Visit
Admission: RE | Admit: 2019-04-26 | Discharge: 2019-04-26 | Disposition: A | Discharge status: Home / Self Care | Payer: Medicare Other | Source: Ambulatory Visit | Attending: Oncology | Admitting: Oncology

## 2019-04-26 ENCOUNTER — Other Ambulatory Visit: Payer: Self-pay

## 2019-04-26 DIAGNOSIS — C189 Malignant neoplasm of colon, unspecified: Secondary | ICD-10-CM | POA: Diagnosis not present

## 2019-04-26 MED ORDER — IOHEXOL 300 MG/ML  SOLN
100.0000 mL | Freq: Once | INTRAMUSCULAR | Status: AC | PRN
  Administered 2019-04-26: 100 mL via INTRAVENOUS

## 2019-05-02 ENCOUNTER — Other Ambulatory Visit: Payer: Self-pay

## 2019-05-02 ENCOUNTER — Other Ambulatory Visit: Payer: Self-pay | Admitting: Oncology

## 2019-05-03 ENCOUNTER — Inpatient Hospital Stay: Payer: Medicare Other

## 2019-05-03 ENCOUNTER — Inpatient Hospital Stay (HOSPITAL_BASED_OUTPATIENT_CLINIC_OR_DEPARTMENT_OTHER): Payer: Medicare Other | Admitting: Oncology

## 2019-05-03 ENCOUNTER — Other Ambulatory Visit: Payer: Self-pay

## 2019-05-03 ENCOUNTER — Encounter: Payer: Self-pay | Admitting: Oncology

## 2019-05-03 VITALS — BP 138/73 | HR 71

## 2019-05-03 VITALS — BP 121/80 | HR 84 | Temp 97.0°F | Resp 20 | Ht 66.0 in | Wt 173.0 lb

## 2019-05-03 DIAGNOSIS — C7800 Secondary malignant neoplasm of unspecified lung: Secondary | ICD-10-CM | POA: Diagnosis not present

## 2019-05-03 DIAGNOSIS — Z95828 Presence of other vascular implants and grafts: Secondary | ICD-10-CM

## 2019-05-03 DIAGNOSIS — N189 Chronic kidney disease, unspecified: Secondary | ICD-10-CM

## 2019-05-03 DIAGNOSIS — T451X5A Adverse effect of antineoplastic and immunosuppressive drugs, initial encounter: Secondary | ICD-10-CM

## 2019-05-03 DIAGNOSIS — Z5111 Encounter for antineoplastic chemotherapy: Secondary | ICD-10-CM

## 2019-05-03 DIAGNOSIS — C187 Malignant neoplasm of sigmoid colon: Secondary | ICD-10-CM | POA: Diagnosis not present

## 2019-05-03 DIAGNOSIS — G620 Drug-induced polyneuropathy: Secondary | ICD-10-CM | POA: Diagnosis not present

## 2019-05-03 DIAGNOSIS — D701 Agranulocytosis secondary to cancer chemotherapy: Secondary | ICD-10-CM

## 2019-05-03 DIAGNOSIS — I129 Hypertensive chronic kidney disease with stage 1 through stage 4 chronic kidney disease, or unspecified chronic kidney disease: Secondary | ICD-10-CM

## 2019-05-03 DIAGNOSIS — C189 Malignant neoplasm of colon, unspecified: Secondary | ICD-10-CM

## 2019-05-03 DIAGNOSIS — Z5112 Encounter for antineoplastic immunotherapy: Secondary | ICD-10-CM | POA: Diagnosis not present

## 2019-05-03 LAB — COMPREHENSIVE METABOLIC PANEL
ALT: 17 U/L (ref 0–44)
AST: 21 U/L (ref 15–41)
Albumin: 4.3 g/dL (ref 3.5–5.0)
Alkaline Phosphatase: 70 U/L (ref 38–126)
Anion gap: 13 (ref 5–15)
BUN: 22 mg/dL (ref 8–23)
CO2: 21 mmol/L — ABNORMAL LOW (ref 22–32)
Calcium: 9.9 mg/dL (ref 8.9–10.3)
Chloride: 104 mmol/L (ref 98–111)
Creatinine, Ser: 1.29 mg/dL — ABNORMAL HIGH (ref 0.61–1.24)
GFR calc Af Amer: >60 mL/min (ref >60)
GFR calc non Af Amer: 52 mL/min — ABNORMAL LOW (ref >60)
Glucose, Bld: 164 mg/dL — ABNORMAL HIGH (ref 70–99)
Potassium: 4.6 mmol/L (ref 3.5–5.1)
Sodium: 138 mmol/L (ref 135–145)
Total Bilirubin: 0.3 mg/dL (ref 0.3–1.2)
Total Protein: 7.6 g/dL (ref 6.5–8.1)

## 2019-05-03 LAB — CBC WITH DIFFERENTIAL/PLATELET
Abs Immature Granulocytes: 0.03 10*3/uL (ref 0.00–0.07)
Basophils Absolute: 0.1 10*3/uL (ref 0.0–0.1)
Basophils Relative: 2 %
Eosinophils Absolute: 0.4 10*3/uL (ref 0.0–0.5)
Eosinophils Relative: 7 %
HCT: 40.1 % (ref 39.0–52.0)
Hemoglobin: 12.9 g/dL — ABNORMAL LOW (ref 13.0–17.0)
Immature Granulocytes: 1 %
Lymphocytes Relative: 52 %
Lymphs Abs: 2.9 10*3/uL (ref 0.7–4.0)
MCH: 29.9 pg (ref 26.0–34.0)
MCHC: 32.2 g/dL (ref 30.0–36.0)
MCV: 93 fL (ref 80.0–100.0)
Monocytes Absolute: 0.8 10*3/uL (ref 0.1–1.0)
Monocytes Relative: 15 %
Neutro Abs: 1.2 10*3/uL — ABNORMAL LOW (ref 1.7–7.7)
Neutrophils Relative %: 23 %
Platelets: 261 10*3/uL (ref 150–400)
RBC: 4.31 MIL/uL (ref 4.22–5.81)
RDW: 14.6 % (ref 11.5–15.5)
WBC: 5.5 10*3/uL (ref 4.0–10.5)
nRBC: 0 % (ref 0.0–0.2)

## 2019-05-03 LAB — PROTEIN, URINE, RANDOM: Total Protein, Urine: 18 mg/dL

## 2019-05-03 MED ORDER — DEXAMETHASONE SODIUM PHOSPHATE 10 MG/ML IJ SOLN
10.0000 mg | Freq: Once | INTRAMUSCULAR | Status: AC
  Administered 2019-05-03: 10 mg via INTRAVENOUS
  Filled 2019-05-03: qty 1

## 2019-05-03 MED ORDER — DEXAMETHASONE SODIUM PHOSPHATE 10 MG/ML IJ SOLN
INTRAMUSCULAR | Status: AC
  Filled 2019-05-03: qty 1

## 2019-05-03 MED ORDER — SODIUM CHLORIDE 0.9 % IV SOLN
Freq: Once | INTRAVENOUS | Status: AC
  Administered 2019-05-03: 10:00:00 via INTRAVENOUS
  Filled 2019-05-03: qty 250

## 2019-05-03 MED ORDER — GABAPENTIN 300 MG PO CAPS: 300.0000 mg | ORAL_CAPSULE | Freq: Three times a day (TID) | ORAL | 3 refills | Status: DC

## 2019-05-03 MED ORDER — SODIUM CHLORIDE 0.9 % IV SOLN
2400.0000 mg/m2 | INTRAVENOUS | Status: DC
  Administered 2019-05-03: 11:00:00 4850 mg via INTRAVENOUS
  Filled 2019-05-03: qty 97

## 2019-05-03 MED ORDER — SODIUM CHLORIDE 0.9% FLUSH
10.0000 mL | Freq: Once | INTRAVENOUS | Status: AC
  Administered 2019-05-03: 10 mL via INTRAVENOUS
  Filled 2019-05-03: qty 10

## 2019-05-03 MED ORDER — LEUCOVORIN CALCIUM INJECTION 100 MG
20.0000 mg/m2 | Freq: Once | INTRAMUSCULAR | Status: AC
  Administered 2019-05-03: 11:00:00 40 mg via INTRAVENOUS
  Filled 2019-05-03: qty 2

## 2019-05-03 MED ORDER — SODIUM CHLORIDE 0.9 % IV SOLN
400.0000 mg | Freq: Once | INTRAVENOUS | Status: AC
  Administered 2019-05-03: 11:00:00 400 mg via INTRAVENOUS
  Filled 2019-05-03: qty 16

## 2019-05-03 MED ORDER — PALONOSETRON HCL INJECTION 0.25 MG/5ML
0.2500 mg | Freq: Once | INTRAVENOUS | Status: AC
  Administered 2019-05-03: 0.25 mg via INTRAVENOUS
  Filled 2019-05-03: qty 5

## 2019-05-03 MED ORDER — FLUOROURACIL CHEMO INJECTION 2.5 GM/50ML
400.0000 mg/m2 | Freq: Once | INTRAVENOUS | Status: AC
  Administered 2019-05-03: 800 mg via INTRAVENOUS
  Filled 2019-05-03: qty 16

## 2019-05-03 NOTE — Progress Notes (Signed)
Per Dr. [name], okay t o proceed with treatment as scheduled with ANC 1.2. Blood return noted before, during and after leucovorin and Adrucil push.

## 2019-05-03 NOTE — Progress Notes (Signed)
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 Patient is a 78 year old male who presented with evidence of bowel obstruction on 02/06/2017. At that time he had progressive abdominal distention and no bowel movement for about one week. CT abdomen showed an apple core lesion in the descending/sigmoid colon.  2. Patient underwent Hartmann's procedure for obstructing sigmoid colon mass on 02/06/2017. Preoperative CEA was 5.0  3. 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 It is 0 out of 15 lymph nodes were positive for malignancy. Perineural and lymphovascular invasion was present. Margins were negative.pT3N0. MMR stable.  4. Given that he had high risk stage II colon cancer including he presented with obstruction and has LVI and perineural invasion- adjuvant xeloda chemotherapy was recommended for 6 months.   5.Patient had evidence of hand foot syndrome after cycle 3. Cycle 4 delayed by 1 week. Topical urea cream prescribed  6. Patient completed 8 cycles of adjuvant xeloda in October 2018. polps seen in sigmoid and decending colon but negative for malignancy  7. Repeat Ct abdomen after 8 cycles showed no metastatic disease. 6 mm lung nodule noted in RLL  8.Patient had a repeat colonoscopy in October 2018 which showed polyps in his transverse and descending colon which were taken out and were negative for  high-grade dysplasia or malignancy. Patient subsequently underwent colostomy takedown procedure by Dr. Burt Knack  9. Repeat CT chest abdomen/ pelvis showed increase in the size of previously seen lung nodules largest being 1.4 cm consistent with metastatic disease. PET/CT showed mild uptake in 2 lung nodules. Others were not hypermetabolic. No other evidence of metastatic disease  10. Repeat lung biopsy consistent with colon adenocarcinoma. Comprehensive RAS panel testing showed mutant KRAS  11.Given the slow rate of growth of his colon cancer and desire to maintain his quality of life plan was to start chemotherapy after he gets a 79-monthbreak in summer. Repeat CT chest abdomen and pelvis in August 2019 showed mild increase in the size of his lung nodules. No new lung nodules are no new sites of distant metastatic disease.FOLFOX Avastin chemotherapy started on 06/22/2018.Patient completed 12 cycles of FOLFOX Avastin chemotherapy with continued response to his lung lesions. Plan is to drop oxaliplatin and continue 5-FU Avastin chemotherapy every 3 weeks instead of every 2 weeks  Interval history-he continues to have some tingling numbness and burning in his bilateral legs which is more or less stable on gabapentin presently.  ECOG PS- 1 Pain scale- 0 Opioid associated constipation- no  Review of systems- Review of Systems  Constitutional: Negative for chills, fever, malaise/fatigue and weight loss.  HENT: Negative for congestion, ear discharge and nosebleeds.   Eyes: Negative for blurred vision.  Respiratory: Negative for cough, hemoptysis, sputum production, shortness of breath and wheezing.   Cardiovascular: Negative for chest pain, palpitations, orthopnea and claudication.  Gastrointestinal: Negative for abdominal pain, blood in stool, constipation, diarrhea, heartburn, melena, nausea and vomiting.  Genitourinary: Negative for dysuria, flank pain, frequency, hematuria and urgency.   Musculoskeletal:  Negative for back pain, joint pain and myalgias.  Skin: Negative for rash.  Neurological: Positive for sensory change (Peripheral neuropathy). Negative for dizziness, tingling, focal weakness, seizures, weakness and headaches.  Endo/Heme/Allergies: Does not bruise/bleed easily.  Psychiatric/Behavioral: Negative for depression and suicidal ideas. The patient does not have insomnia.       No Known Allergies   Past Medical History:  Diagnosis Date   Cancer ([place])    skin cancer-nose w graft,also shoulder- basal cell per pt   Cataract    r eye   Chronic kidney disease    kidney stones 20 y ago per pt   Colon cancer ([place])    Diabetes mellitus without complication ([place])    Eczema    History of kidney stones    Hyperlipidemia    Hypertension    Vertigo      Past Surgical History:  Procedure Laterality Date   CATARACT EXTRACTION Left    COLECTOMY WITH COLOSTOMY CREATION/HARTMANN PROCEDURE N/A 02/06/2017   Procedure: COLECTOMY WITH COLOSTOMY CREATION/HARTMANN PROCEDURE;  Surgeon: [name], MD;  Location: [facility] ORS;  Service: General;  Laterality: N/A;   COLON SURGERY     COLONOSCOPY WITH PROPOFOL N/A 09/04/2017   Procedure: COLONOSCOPY WITH PROPOFOL;  Surgeon: Jonathon Bellows, MD;  Location: [facility] ENDOSCOPY;  Service: Gastroenterology;  Laterality: N/A;   COLOSTOMY CLOSURE N/A 10/20/2017   Procedure: COLOSTOMY CLOSURE;  Surgeon: [name], MD;  Location: [facility] ORS;  Service: General;  Laterality: N/A;   CYSTOSCOPY     FLEXIBLE SIGMOIDOSCOPY N/A 02/06/2017   Procedure: FLEXIBLE SIGMOIDOSCOPY;  Surgeon: Christene Lye, MD;  Location: [facility] ENDOSCOPY;  Service: Endoscopy;  Laterality: N/A;   PORTACATH PLACEMENT N/A 06/17/2018   Procedure: INSERTION PORT-A-CATH, WITH FLUOROSCOPY;  Surgeon: [name], MD;  Location: [facility] ORS;  Service: General;  Laterality: N/A;    Social History   Socioeconomic History   Marital status:  Married    Spouse name: Not on file   Number of children: Not on file   Years of education: Not on file   Highest education level: Not on file  Occupational History   Not on file  Social Needs   Financial resource strain: Not on file   Food insecurity    Worry: Not on file    Inability: Not on file   Transportation needs    Medical: Not on file    Non-medical: Not on file  Tobacco Use   Smoking status: Never Smoker   Smokeless tobacco: Never Used  Substance and Sexual Activity   Alcohol use: No   Drug use: No   Sexual activity: Not Currently  Lifestyle   Physical activity    Days per week: Not on file    Minutes per session: Not on file   Stress: Not on file  Relationships   Social connections    Talks on phone: Not on file    Gets together: Not on file    Attends religious service: Not on file    Active member of club or organization: Not on file    Attends meetings of clubs or organizations: Not on file    Relationship status: Not on file   Intimate partner violence    Fear of current or ex partner: Not on file    Emotionally abused: Not on file    Physically abused: Not on file    Forced sexual activity: Not on file  Other Topics Concern   Not on file  Social History Narrative   Not on file    Family History  Problem Relation Age of Onset   Diabetes Mother    AAA (abdominal aortic aneurysm) Mother    Coronary artery disease Mother    Diabetes Father    Coronary artery disease Father    Dementia Father    Breast cancer Sister      Current Outpatient Medications:    acetaminophen (TYLENOL) 500 MG tablet, Take 500 mg by mouth every 6 (six) hours as needed (for pain.)., Disp: , Rfl:    aspirin EC 81 MG tablet, Take 81 mg by mouth daily., Disp: , Rfl:    Docusate Sodium (COLACE PO), Take 1 Dose by mouth as needed., Disp: , Rfl:    Dulaglutide 0.75 MG/0.5ML SOPN, Inject into the skin once a week. , Disp: , Rfl:    gabapentin  (NEURONTIN) 300 MG capsule, Take 1 capsule (300 mg total) by mouth 3 (three) times daily., Disp: 90 capsule, Rfl: 3   glimepiride (AMARYL) 2 MG tablet, Take 2 mg by mouth 2 (two) times daily before a meal. , Disp: , Rfl:    lidocaine-prilocaine (EMLA) cream, Place a small amount over cream over port 1 hour before each treatment, cover cream with saran wrap for clothing protection, Disp: 30 g, Rfl: 1   loratadine (CLARITIN) 10 MG tablet, Take 10 mg by mouth daily., Disp: , Rfl:    losartan (COZAAR) 25 MG tablet, Take 25 mg by mouth daily., Disp: , Rfl:    metFORMIN (GLUCOPHAGE) 1000 MG tablet, Take 1,000-1,500 tablets by mouth See admin instructions. TAKE 1 TABLET (1000 MG) BY MOUTH IN THE MORNING & TAKE 1.5 TABLETS (1500 MG) BY MOUTH AT SUPPER, Disp: , Rfl:    pravastatin (PRAVACHOL) 20 MG tablet, Take 20 mg by mouth daily with supper. , Disp: , Rfl:    ondansetron (ZOFRAN) 8 MG tablet, Take 1 tablet (8 mg total) by mouth 2 (two) times daily as needed for refractory nausea / vomiting. Start on day 3 after chemotherapy. (Patient not taking: Reported on 04/12/2019), Disp: 30 tablet, Rfl: 1   prochlorperazine (COMPAZINE) 10 MG tablet, Take 1 tablet (10 mg total) by mouth every 6 (six) hours as needed (Nausea or vomiting). (Patient not taking: Reported on 04/12/2019), Disp: 30 tablet, Rfl: 1   triamcinolone (KENALOG) 0.025 % cream, Apply 1 application topically daily as needed (FOR EZCEMA). (Patient not taking: Reported on 05/03/2019), Disp: 30 g, Rfl: 2 No current facility-administered medications for this visit.   Facility-Administered Medications Ordered in Other Visits:    0.9 %  sodium chloride infusion, , Intravenous, Continuous, Sindy Guadeloupe, MD, Stopped at 07/27/18 1037   fluorouracil (ADRUCIL) 4,850 mg in sodium chloride 0.9 % 53 mL chemo infusion, 2,400 mg/m2 (Treatment Plan Recorded), Intravenous, 1 day or 1 dose, Sindy Guadeloupe, MD, 4,850 mg at 05/03/19 1118   heparin lock flush 100  unit/mL, 500 Units, Intravenous, Once, Sindy Guadeloupe, MD   sodium chloride flush (NS) 0.9 % injection 10 mL, 10 mL, Intravenous, PRN, Sindy Guadeloupe, MD, 10 mL at 07/27/18 0920  Physical exam:  Vitals:   05/03/19 0844  BP: 121/80  Pulse: 84  Resp: 20  Temp: (!) 97 F (36.1 C)  TempSrc: Tympanic  Weight: 173 lb (78.5 kg)  Height: '5\' 6"'  (1.676 m)   Physical Exam Constitutional:      General: He is not in acute distress. HENT:     Head: Normocephalic and atraumatic.  Eyes:     Pupils: Pupils are equal, round, and reactive to light.  Neck:     Musculoskeletal: Normal range of motion.  Cardiovascular:     Rate and Rhythm: Normal rate and regular rhythm.     Heart sounds: Normal heart sounds.  Pulmonary:     Effort: Pulmonary effort is normal.     Breath sounds: Normal breath sounds.  Abdominal:     General: Bowel sounds are normal.     Palpations: Abdomen is soft.  Skin:    General: Skin is warm and dry.  Neurological:     Mental Status: He is alert and oriented to person, place, and time.      CMP Latest Ref Rng & Units 05/03/2019  Glucose 70 - 99 mg/dL 164(H)  BUN 8 - 23 mg/dL 22  Creatinine 0.61 - 1.24 mg/dL 1.29(H)  Sodium 135 - 145 mmol/L 138  Potassium 3.5 - 5.1 mmol/L 4.6  Chloride 98 - 111 mmol/L 104  CO2 22 - 32 mmol/L 21(L)  Calcium 8.9 - 10.3 mg/dL 9.9  Total Protein 6.5 - 8.1 g/dL 7.6  Total Bilirubin 0.3 - 1.2 mg/dL 0.3  Alkaline Phos 38 - 126 U/L 70  AST 15 - 41 U/L 21  ALT 0 - 44 U/L 17   CBC Latest Ref Rng & Units 05/03/2019  WBC 4.0 - 10.5 K/uL 5.5  Hemoglobin 13.0 - 17.0 g/dL 12.9(L)  Hematocrit 39.0 - 52.0 % 40.1  Platelets 150 - 400 K/uL 261    No images are attached to the encounter.  Ct Chest W Contrast  Result Date: 04/26/2019 CLINICAL DATA:  Restaging metastatic colon cancer EXAM: CT CHEST, ABDOMEN, AND PELVIS WITH CONTRAST TECHNIQUE: Multidetector CT imaging of the chest, abdomen and pelvis was performed following the standard  protocol during bolus administration of intravenous contrast. CONTRAST:  162m OMNIPAQUE IOHEXOL 300 MG/ML SOLN, additional oral enteric contrast COMPARISON:  CT chest abdomen pelvis,, 12/13/2018 09/17/2018, 06/18/2018, PET-CT, 03/19/2018 FINDINGS: CT CHEST FINDINGS Cardiovascular: Left chest port catheter. Three-vessel coronary artery calcifications. Aortic atherosclerosis. Normal heart size. No pericardial effusion. Mediastinum/Nodes: No enlarged mediastinal, hilar, or axillary lymph nodes. Thyroid gland, trachea, and esophagus demonstrate no significant findings. Lungs/Pleura: A lobulated perihilar nodule of the lingula is slightly enlarged compared to prior examination, measuring 1.3 x 1.2 cm, previously 1.2 x 0.8 cm when measured similarly (series 4, image 79). A previously biopsied lingular nodule appears slightly more full although is not increased by measurement at 1.4 x 1.0 cm (series 4, image 87). A small nodule of the left upper lobe is slightly enlarged at 6 mm, previously 5 mm (series 4, image 64). Unchanged small nodule of the right lower lobe (series 4, image 111). No new nodules appreciated. No pleural effusion or pneumothorax. Musculoskeletal: No chest wall mass or suspicious bone lesions identified. CT ABDOMEN PELVIS FINDINGS Hepatobiliary: No solid liver abnormality is seen. Gallstones near the gallbladder neck. No gallbladder wall thickening, or biliary dilatation. Pancreas: Unremarkable. No pancreatic ductal dilatation or surrounding inflammatory changes. Spleen: Normal in size without significant abnormality. Adrenals/Urinary Tract: Adrenal glands are unremarkable. Punctuate nonobstructive calculus of the superior pole of the right kidney. Bladder is unremarkable. Stomach/Bowel: Stomach is within normal limits. Appendix appears normal. No evidence of bowel wall thickening, distention, or inflammatory changes. Evidence of prior sigmoid colon resection and anastomosis. Vascular/Lymphatic: Aortic  atherosclerosis. No enlarged abdominal or pelvic lymph nodes. Reproductive: No mass or other abnormality. Other: No abdominal wall hernia or abnormality. No abdominopelvic ascites.  Musculoskeletal: No acute or significant osseous findings. IMPRESSION: 1. Slight interval enlargement of multiple small nodules. A lobulated perihilar nodule of the lingula is slightly enlarged compared to prior examination, measuring 1.3 x 1.2 cm, previously 1.2 x 0.8 cm when measured similarly (series 4, image 79). A previously biopsied lingular nodule appears slightly more full although is not increased by measurement at 1.4 x 1.0 cm (series 4, image 87). A small nodule of the left upper lobe is slightly enlarged at 6 mm, previously 5 mm (series 4, image 64). Unchanged small nodule of the right lower lobe (series 4, image 111). No new nodules appreciated. Findings are consistent with enlarging metastases. 2. No other evidence of metastatic disease in the chest, abdomen, or pelvis. 3.  Status post sigmoid colon resection and anastomosis. 4. Other chronic, incidental, and postoperative findings as detailed above. Electronically Signed   By: Eddie Candle M.D.   On: 04/26/2019 16:07   Ct Abdomen Pelvis W Contrast  Result Date: 04/26/2019 CLINICAL DATA:  Restaging metastatic colon cancer EXAM: CT CHEST, ABDOMEN, AND PELVIS WITH CONTRAST TECHNIQUE: Multidetector CT imaging of the chest, abdomen and pelvis was performed following the standard protocol during bolus administration of intravenous contrast. CONTRAST:  111m OMNIPAQUE IOHEXOL 300 MG/ML SOLN, additional oral enteric contrast COMPARISON:  CT chest abdomen pelvis,, 12/13/2018 09/17/2018, 06/18/2018, PET-CT, 03/19/2018 FINDINGS: CT CHEST FINDINGS Cardiovascular: Left chest port catheter. Three-vessel coronary artery calcifications. Aortic atherosclerosis. Normal heart size. No pericardial effusion. Mediastinum/Nodes: No enlarged mediastinal, hilar, or axillary lymph nodes. Thyroid  gland, trachea, and esophagus demonstrate no significant findings. Lungs/Pleura: A lobulated perihilar nodule of the lingula is slightly enlarged compared to prior examination, measuring 1.3 x 1.2 cm, previously 1.2 x 0.8 cm when measured similarly (series 4, image 79). A previously biopsied lingular nodule appears slightly more full although is not increased by measurement at 1.4 x 1.0 cm (series 4, image 87). A small nodule of the left upper lobe is slightly enlarged at 6 mm, previously 5 mm (series 4, image 64). Unchanged small nodule of the right lower lobe (series 4, image 111). No new nodules appreciated. No pleural effusion or pneumothorax. Musculoskeletal: No chest wall mass or suspicious bone lesions identified. CT ABDOMEN PELVIS FINDINGS Hepatobiliary: No solid liver abnormality is seen. Gallstones near the gallbladder neck. No gallbladder wall thickening, or biliary dilatation. Pancreas: Unremarkable. No pancreatic ductal dilatation or surrounding inflammatory changes. Spleen: Normal in size without significant abnormality. Adrenals/Urinary Tract: Adrenal glands are unremarkable. Punctuate nonobstructive calculus of the superior pole of the right kidney. Bladder is unremarkable. Stomach/Bowel: Stomach is within normal limits. Appendix appears normal. No evidence of bowel wall thickening, distention, or inflammatory changes. Evidence of prior sigmoid colon resection and anastomosis. Vascular/Lymphatic: Aortic atherosclerosis. No enlarged abdominal or pelvic lymph nodes. Reproductive: No mass or other abnormality. Other: No abdominal wall hernia or abnormality. No abdominopelvic ascites. Musculoskeletal: No acute or significant osseous findings. IMPRESSION: 1. Slight interval enlargement of multiple small nodules. A lobulated perihilar nodule of the lingula is slightly enlarged compared to prior examination, measuring 1.3 x 1.2 cm, previously 1.2 x 0.8 cm when measured similarly (series 4, image 79). A  previously biopsied lingular nodule appears slightly more full although is not increased by measurement at 1.4 x 1.0 cm (series 4, image 87). A small nodule of the left upper lobe is slightly enlarged at 6 mm, previously 5 mm (series 4, image 64). Unchanged small nodule of the right lower lobe (series 4, image 111). No new nodules  appreciated. Findings are consistent with enlarging metastases. 2. No other evidence of metastatic disease in the chest, abdomen, or pelvis. 3.  Status post sigmoid colon resection and anastomosis. 4. Other chronic, incidental, and postoperative findings as detailed above. Electronically Signed   By: Eddie Candle M.D.   On: 04/26/2019 16:07     Assessment and plan- Patient is a 78 y.o. 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  He is here for on treatment assessment prior to cycle 18 of 5-FU Avastin chemotherapy and to discuss the results of the CT scan  I have reviewed CT chest images independently and discussed findings with the patient.  Overall there is mild enlargement seen in the size of his previously noted lung nodules.  For example one nodule grew from 1.2 x 0.8 cm to 1.3 x 1.2 cm.  Another nodule grew from 5 mm to 6 mm.  There are no new lung nodules seen and no other evidence of metastatic disease outside the chest.  I therefore recommend continuing ongoing treatment without making any changes yet.  Patient has been getting 5-FU Avastin chemotherapy every 3 weeks instead of every 2 weeks for his quality of life but now he is agreeable to going back to the every 2-week regimen.  We will not be adding back the oxaliplatin at this time given his ongoing neuropathy.  Counts are okay to proceed with cycle 18 of 5-FU Avastin chemotherapy today.  His ANC is mildly low at 1.2.  He will therefore receive with udencya on day 3 of pump disconnect.  I will see him back in 2 weeks time with CBC with differential, CMP and urine protein for cycle 19 of 5-FU Avastin  chemotherapy.  We will plan to get repeat scans in 3 months time and if there is progressive enlargement of his lung nodules I will plan to add irinotecan at that time  Continue gabapentin for chemo-induced peripheral neuropathy   Visit Diagnosis 1. Encounter for antineoplastic chemotherapy   2. Encounter for monoclonal antibody treatment for malignancy   3. Malignant neoplasm metastatic to lung, unspecified laterality ([place])   4. Malignant neoplasm of sigmoid colon ([place])   5. Chemotherapy-induced peripheral neuropathy ([place])   6. Chemotherapy induced neutropenia ([facility])      Dr. Randa Evens, MD, MPH [facility] at [facility] [phone] 05/03/2019 1:43 PM

## 2019-05-04 ENCOUNTER — Other Ambulatory Visit: Payer: Self-pay | Admitting: *Deleted

## 2019-05-04 DIAGNOSIS — C189 Malignant neoplasm of colon, unspecified: Secondary | ICD-10-CM

## 2019-05-04 DIAGNOSIS — C7800 Secondary malignant neoplasm of unspecified lung: Secondary | ICD-10-CM

## 2019-05-05 ENCOUNTER — Inpatient Hospital Stay: Payer: Medicare Other

## 2019-05-05 ENCOUNTER — Other Ambulatory Visit: Payer: Self-pay

## 2019-05-05 VITALS — BP 145/73 | HR 82 | Temp 97.9°F | Resp 18

## 2019-05-05 DIAGNOSIS — C7800 Secondary malignant neoplasm of unspecified lung: Secondary | ICD-10-CM

## 2019-05-05 DIAGNOSIS — C189 Malignant neoplasm of colon, unspecified: Secondary | ICD-10-CM

## 2019-05-05 DIAGNOSIS — Z5112 Encounter for antineoplastic immunotherapy: Secondary | ICD-10-CM | POA: Diagnosis not present

## 2019-05-05 MED ORDER — HEPARIN SOD (PORK) LOCK FLUSH 100 UNIT/ML IV SOLN
500.0000 [IU] | Freq: Once | INTRAVENOUS | Status: AC | PRN
  Administered 2019-05-05: 500 [IU]
  Filled 2019-05-05: qty 5

## 2019-05-05 MED ORDER — SODIUM CHLORIDE 0.9% FLUSH
10.0000 mL | INTRAVENOUS | Status: DC | PRN
  Administered 2019-05-05: 10 mL
  Filled 2019-05-05: qty 10

## 2019-05-05 MED ORDER — PEGFILGRASTIM-CBQV 6 MG/0.6ML ~~LOC~~ SOSY
6.0000 mg | PREFILLED_SYRINGE | Freq: Once | SUBCUTANEOUS | Status: AC
  Administered 2019-05-05: 6 mg via SUBCUTANEOUS
  Filled 2019-05-05: qty 0.6

## 2019-05-17 ENCOUNTER — Inpatient Hospital Stay: Payer: Medicare Other | Attending: Oncology

## 2019-05-17 ENCOUNTER — Other Ambulatory Visit: Payer: Self-pay

## 2019-05-17 ENCOUNTER — Inpatient Hospital Stay (HOSPITAL_BASED_OUTPATIENT_CLINIC_OR_DEPARTMENT_OTHER): Payer: Medicare Other | Admitting: Oncology

## 2019-05-17 ENCOUNTER — Encounter: Payer: Self-pay | Admitting: Oncology

## 2019-05-17 ENCOUNTER — Inpatient Hospital Stay: Payer: Medicare Other

## 2019-05-17 VITALS — BP 135/77 | HR 71 | Temp 96.2°F | Resp 18 | Ht 66.0 in | Wt 176.1 lb

## 2019-05-17 DIAGNOSIS — Z5111 Encounter for antineoplastic chemotherapy: Secondary | ICD-10-CM

## 2019-05-17 DIAGNOSIS — Z803 Family history of malignant neoplasm of breast: Secondary | ICD-10-CM

## 2019-05-17 DIAGNOSIS — C7800 Secondary malignant neoplasm of unspecified lung: Secondary | ICD-10-CM | POA: Diagnosis not present

## 2019-05-17 DIAGNOSIS — Z5112 Encounter for antineoplastic immunotherapy: Secondary | ICD-10-CM | POA: Insufficient documentation

## 2019-05-17 DIAGNOSIS — Z79899 Other long term (current) drug therapy: Secondary | ICD-10-CM | POA: Diagnosis not present

## 2019-05-17 DIAGNOSIS — C189 Malignant neoplasm of colon, unspecified: Secondary | ICD-10-CM

## 2019-05-17 DIAGNOSIS — C187 Malignant neoplasm of sigmoid colon: Secondary | ICD-10-CM

## 2019-05-17 DIAGNOSIS — Z5189 Encounter for other specified aftercare: Secondary | ICD-10-CM | POA: Diagnosis not present

## 2019-05-17 LAB — CBC WITH DIFFERENTIAL/PLATELET
Abs Immature Granulocytes: 0.26 10*3/uL — ABNORMAL HIGH (ref 0.00–0.07)
Basophils Absolute: 0.1 10*3/uL (ref 0.0–0.1)
Basophils Relative: 1 %
Eosinophils Absolute: 0.3 10*3/uL (ref 0.0–0.5)
Eosinophils Relative: 3 %
HCT: 39.4 % (ref 39.0–52.0)
Hemoglobin: 12.6 g/dL — ABNORMAL LOW (ref 13.0–17.0)
Immature Granulocytes: 3 %
Lymphocytes Relative: 23 %
Lymphs Abs: 2.5 10*3/uL (ref 0.7–4.0)
MCH: 29.6 pg (ref 26.0–34.0)
MCHC: 32 g/dL (ref 30.0–36.0)
MCV: 92.7 fL (ref 80.0–100.0)
Monocytes Absolute: 0.5 10*3/uL (ref 0.1–1.0)
Monocytes Relative: 5 %
Neutro Abs: 7 10*3/uL (ref 1.7–7.7)
Neutrophils Relative %: 65 %
Platelets: 177 10*3/uL (ref 150–400)
RBC: 4.25 MIL/uL (ref 4.22–5.81)
RDW: 15.5 % (ref 11.5–15.5)
WBC: 10.6 10*3/uL — ABNORMAL HIGH (ref 4.0–10.5)
nRBC: 0 % (ref 0.0–0.2)

## 2019-05-17 LAB — COMPREHENSIVE METABOLIC PANEL
ALT: 13 U/L (ref 0–44)
AST: 20 U/L (ref 15–41)
Albumin: 4.1 g/dL (ref 3.5–5.0)
Alkaline Phosphatase: 86 U/L (ref 38–126)
Anion gap: 12 (ref 5–15)
BUN: 16 mg/dL (ref 8–23)
CO2: 21 mmol/L — ABNORMAL LOW (ref 22–32)
Calcium: 8.9 mg/dL (ref 8.9–10.3)
Chloride: 107 mmol/L (ref 98–111)
Creatinine, Ser: 1.32 mg/dL — ABNORMAL HIGH (ref 0.61–1.24)
GFR calc Af Amer: 59 mL/min — ABNORMAL LOW (ref >60)
GFR calc non Af Amer: 51 mL/min — ABNORMAL LOW (ref >60)
Glucose, Bld: 209 mg/dL — ABNORMAL HIGH (ref 70–99)
Potassium: 4.1 mmol/L (ref 3.5–5.1)
Sodium: 140 mmol/L (ref 135–145)
Total Bilirubin: 0.5 mg/dL (ref 0.3–1.2)
Total Protein: 7.2 g/dL (ref 6.5–8.1)

## 2019-05-17 LAB — PROTEIN, URINE, RANDOM: Total Protein, Urine: 11 mg/dL

## 2019-05-17 MED ORDER — SODIUM CHLORIDE 0.9 % IV SOLN
400.0000 mg | Freq: Once | INTRAVENOUS | Status: AC
  Administered 2019-05-17: 10:00:00 400 mg via INTRAVENOUS
  Filled 2019-05-17: qty 16

## 2019-05-17 MED ORDER — SODIUM CHLORIDE 0.9 % IV SOLN
2400.0000 mg/m2 | INTRAVENOUS | Status: DC
  Administered 2019-05-17: 4850 mg via INTRAVENOUS
  Filled 2019-05-17: qty 97

## 2019-05-17 MED ORDER — SODIUM CHLORIDE 0.9 % IV SOLN
INTRAVENOUS | Status: DC
  Administered 2019-05-17: 10:00:00 via INTRAVENOUS
  Filled 2019-05-17: qty 250

## 2019-05-17 MED ORDER — PALONOSETRON HCL INJECTION 0.25 MG/5ML
0.2500 mg | Freq: Once | INTRAVENOUS | Status: AC
  Administered 2019-05-17: 10:00:00 0.25 mg via INTRAVENOUS
  Filled 2019-05-17: qty 5

## 2019-05-17 MED ORDER — LEUCOVORIN CALCIUM INJECTION 100 MG
20.0000 mg/m2 | Freq: Once | INTRAMUSCULAR | Status: AC
  Administered 2019-05-17: 11:00:00 40 mg via INTRAVENOUS
  Filled 2019-05-17: qty 2

## 2019-05-17 MED ORDER — FLUOROURACIL CHEMO INJECTION 2.5 GM/50ML
400.0000 mg/m2 | Freq: Once | INTRAVENOUS | Status: AC
  Administered 2019-05-17: 11:00:00 800 mg via INTRAVENOUS
  Filled 2019-05-17: qty 16

## 2019-05-17 MED ORDER — SODIUM CHLORIDE 0.9% FLUSH
10.0000 mL | INTRAVENOUS | Status: DC | PRN
  Administered 2019-05-17: 08:00:00 10 mL via INTRAVENOUS
  Filled 2019-05-17: qty 10

## 2019-05-17 MED ORDER — DEXAMETHASONE SODIUM PHOSPHATE 10 MG/ML IJ SOLN
10.0000 mg | Freq: Once | INTRAMUSCULAR | Status: AC
  Administered 2019-05-17: 10 mg via INTRAVENOUS
  Filled 2019-05-17: qty 1

## 2019-05-17 NOTE — Progress Notes (Signed)
No new changes noted today 

## 2019-05-17 NOTE — Progress Notes (Signed)
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 Patient is a 78 year old male who presented with evidence of bowel obstruction on 02/06/2017. At that time he had progressive abdominal distention and no bowel movement for about one week. CT abdomen showed an apple core lesion in the descending/sigmoid colon.  2. Patient underwent Hartmann's procedure for obstructing sigmoid colon mass on 02/06/2017. Preoperative CEA was 5.0  3. 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 It is 0 out of 15 lymph nodes were positive for malignancy. Perineural and lymphovascular invasion was present. Margins were negative.pT3N0. MMR stable.  4. Given that he had high risk stage II colon cancer including he presented with obstruction and has LVI and perineural invasion- adjuvant xeloda chemotherapy was recommended for 6 months.   5.Patient had evidence of hand foot syndrome after cycle 3. Cycle 4 delayed by 1 week. Topical urea cream prescribed  6. Patient completed 8 cycles of adjuvant xeloda in October 2018. polps seen in sigmoid and decending colon but negative for malignancy  7. Repeat Ct abdomen after 8 cycles showed no metastatic disease. 6 mm lung nodule noted in RLL  8.Patient had a repeat colonoscopy in October 2018 which showed polyps in his transverse and descending colon which were taken out and were negative for  high-grade dysplasia or malignancy. Patient subsequently underwent colostomy takedown procedure by Dr. Burt Knack  9. Repeat CT chest abdomen/ pelvis showed increase in the size of previously seen lung nodules largest being 1.4 cm consistent with metastatic disease. PET/CT showed mild uptake in 2 lung nodules. Others were not hypermetabolic. No other evidence of metastatic disease  10. Repeat lung biopsy consistent with colon adenocarcinoma. 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 Repeat CT chest abdomen and pelvis in August 2019 showed mild increase in the size of his lung nodules. No new lung nodules are no new sites of distant metastatic disease.FOLFOX Avastin chemotherapy started on 06/22/2018.Patient completed 12 cycles of FOLFOX Avastin chemotherapy with continued response to his lung lesions. Oxaliplatin was dropped and patient is currently on 5-FU Avastin  Interval history-overall patient feels well.  He has baseline tingling numbness in his hands and feet which is stable on gabapentin.  Has mild fatigue.  Denies any cough shortness of breath or fever  ECOG PS- 1 Pain scale- 0 Opioid associated constipation- no  Review of systems- Review of Systems  Constitutional: Negative for chills, fever, malaise/fatigue and weight loss.  HENT: Negative for congestion, ear discharge and nosebleeds.   Eyes: Negative for blurred vision.  Respiratory: Negative for cough, hemoptysis, sputum production, shortness of breath and wheezing.   Cardiovascular: Negative for chest pain, palpitations, orthopnea and claudication.  Gastrointestinal: Negative for abdominal pain, blood in stool, constipation, diarrhea, heartburn, melena, nausea and vomiting.  Genitourinary: Negative for dysuria, flank pain, frequency, hematuria  and  urgency.  Musculoskeletal: Negative for back pain, joint pain and myalgias.  Skin: Negative for rash.  Neurological: Negative for dizziness, tingling, focal weakness, seizures, weakness and headaches.  Endo/Heme/Allergies: Does not bruise/bleed easily.  Psychiatric/Behavioral: Negative for depression and suicidal ideas. The patient does not have insomnia.       No Known Allergies   Past Medical History:  Diagnosis Date   Cancer ([place])    skin cancer-nose w graft,also shoulder- basal cell per pt   Cataract    r eye   Chronic kidney disease    kidney stones 20 y ago per pt   Colon cancer ([place])    Diabetes mellitus without complication ([place])    Eczema    History of kidney stones    Hyperlipidemia    Hypertension    Vertigo      Past Surgical History:  Procedure Laterality Date   CATARACT EXTRACTION Left    COLECTOMY WITH COLOSTOMY CREATION/HARTMANN PROCEDURE N/A 02/06/2017   Procedure: COLECTOMY WITH COLOSTOMY CREATION/HARTMANN PROCEDURE;  Surgeon: [name], MD;  Location: [facility] ORS;  Service: General;  Laterality: N/A;   COLON SURGERY     COLONOSCOPY WITH PROPOFOL N/A 09/04/2017   Procedure: COLONOSCOPY WITH PROPOFOL;  Surgeon: Jonathon Bellows, MD;  Location: [facility] ENDOSCOPY;  Service: Gastroenterology;  Laterality: N/A;   COLOSTOMY CLOSURE N/A 10/20/2017   Procedure: COLOSTOMY CLOSURE;  Surgeon: [name], MD;  Location: [facility] ORS;  Service: General;  Laterality: N/A;   CYSTOSCOPY     FLEXIBLE SIGMOIDOSCOPY N/A 02/06/2017   Procedure: FLEXIBLE SIGMOIDOSCOPY;  Surgeon: Christene Lye, MD;  Location: [facility] ENDOSCOPY;  Service: Endoscopy;  Laterality: N/A;   PORTACATH PLACEMENT N/A 06/17/2018   Procedure: INSERTION PORT-A-CATH, WITH FLUOROSCOPY;  Surgeon: [name], MD;  Location: [facility] ORS;  Service: General;  Laterality: N/A;    Social History   Socioeconomic History   Marital status: Married    Spouse name: Not on file   Number  of children: Not on file   Years of education: Not on file   Highest education level: Not on file  Occupational History   Not on file  Social Needs   Financial resource strain: Not on file   Food insecurity    Worry: Not on file    Inability: Not on file   Transportation needs    Medical: Not on file    Non-medical: Not on file  Tobacco Use   Smoking status: Never Smoker   Smokeless tobacco: Never Used  Substance and Sexual Activity   Alcohol use: No   Drug use: No   Sexual activity: Not Currently  Lifestyle   Physical activity    Days per week: Not on file    Minutes per session: Not on file   Stress: Not on file  Relationships   Social connections    Talks on phone: Not on file    Gets together: Not on file    Attends religious service: Not on file    Active member of club or organization: Not on file    Attends meetings of clubs or organizations: Not on file    Relationship status: Not on file   Intimate partner violence    Fear of current or ex partner: Not on file    Emotionally abused: Not on file    Physically abused: Not on file    Forced sexual activity: Not on file  Other Topics Concern   Not on file  Social  History Narrative   Not on file    Family History  Problem Relation Age of Onset   Diabetes Mother    AAA (abdominal aortic aneurysm) Mother    Coronary artery disease Mother    Diabetes Father    Coronary artery disease Father    Dementia Father    Breast cancer Sister      Current Outpatient Medications:    aspirin EC 81 MG tablet, Take 81 mg by mouth daily., Disp: , Rfl:    Dulaglutide 0.75 MG/0.5ML SOPN, Inject into the skin once a week. , Disp: , Rfl:    gabapentin (NEURONTIN) 300 MG capsule, Take 1 capsule (300 mg total) by mouth 3 (three) times daily., Disp: 90 capsule, Rfl: 3   glimepiride (AMARYL) 2 MG tablet, Take 2 mg by mouth 2 (two) times daily before a meal. , Disp: , Rfl:    loratadine (CLARITIN) 10  MG tablet, Take 10 mg by mouth daily., Disp: , Rfl:    losartan (COZAAR) 25 MG tablet, Take 25 mg by mouth daily., Disp: , Rfl:    metFORMIN (GLUCOPHAGE) 1000 MG tablet, Take 1,000-1,500 tablets by mouth See admin instructions. TAKE 1 TABLET (1000 MG) BY MOUTH IN THE MORNING & TAKE 1.5 TABLETS (1500 MG) BY MOUTH AT SUPPER, Disp: , Rfl:    pravastatin (PRAVACHOL) 20 MG tablet, Take 20 mg by mouth daily with supper. , Disp: , Rfl:    acetaminophen (TYLENOL) 500 MG tablet, Take 500 mg by mouth every 6 (six) hours as needed (for pain.)., Disp: , Rfl:    Docusate Sodium (COLACE PO), Take 1 Dose by mouth as needed., Disp: , Rfl:    lidocaine-prilocaine (EMLA) cream, Place a small amount over cream over port 1 hour before each treatment, cover cream with saran wrap for clothing protection (Patient not taking: Reported on 05/17/2019), Disp: 30 g, Rfl: 1   ondansetron (ZOFRAN) 8 MG tablet, Take 1 tablet (8 mg total) by mouth 2 (two) times daily as needed for refractory nausea / vomiting. Start on day 3 after chemotherapy. (Patient not taking: Reported on 04/12/2019), Disp: 30 tablet, Rfl: 1   prochlorperazine (COMPAZINE) 10 MG tablet, Take 1 tablet (10 mg total) by mouth every 6 (six) hours as needed (Nausea or vomiting). (Patient not taking: Reported on 04/12/2019), Disp: 30 tablet, Rfl: 1   triamcinolone (KENALOG) 0.025 % cream, Apply 1 application topically daily as needed (FOR EZCEMA). (Patient not taking: Reported on 05/03/2019), Disp: 30 g, Rfl: 2 No current facility-administered medications for this visit.   Facility-Administered Medications Ordered in Other Visits:    0.9 %  sodium chloride infusion, , Intravenous, Continuous, Sindy Guadeloupe, MD, Stopped at 07/27/18 1037   heparin lock flush 100 unit/mL, 500 Units, Intravenous, Once, Sindy Guadeloupe, MD   sodium chloride flush (NS) 0.9 % injection 10 mL, 10 mL, Intravenous, PRN, Sindy Guadeloupe, MD, 10 mL at 07/27/18 0920   sodium chloride flush  (NS) 0.9 % injection 10 mL, 10 mL, Intravenous, PRN, Sindy Guadeloupe, MD, 10 mL at 05/17/19 0825  Physical exam:  Vitals:   05/17/19 0835  BP: 135/77  Pulse: 71  Resp: 18  Temp: (!) 96.2 F (35.7 C)  TempSrc: Tympanic  SpO2: 97%  Weight: 176 lb 1.6 oz (79.9 kg)  Height: 5' 6" (1.676 m)   Physical Exam Constitutional:      General: He is not in acute distress. HENT:     Head: Normocephalic and atraumatic.  Eyes:     Pupils: Pupils are equal, round, and reactive to light.  Neck:     Musculoskeletal: Normal range of motion.  Cardiovascular:     Rate and Rhythm: Normal rate and regular rhythm.     Heart sounds: Normal heart sounds.  Pulmonary:     Effort: Pulmonary effort is normal.     Breath sounds: Normal breath sounds.  Abdominal:     General: Bowel sounds are normal.     Palpations: Abdomen is soft.  Skin:    General: Skin is warm and dry.  Neurological:     Mental Status: He is alert and oriented to person, place, and time.      CMP Latest Ref Rng & Units 05/17/2019  Glucose 70 - 99 mg/dL 209(H)  BUN 8 - 23 mg/dL 16  Creatinine 0.61 - 1.24 mg/dL 1.32(H)  Sodium 135 - 145 mmol/L 140  Potassium 3.5 - 5.1 mmol/L 4.1  Chloride 98 - 111 mmol/L 107  CO2 22 - 32 mmol/L 21(L)  Calcium 8.9 - 10.3 mg/dL 8.9  Total Protein 6.5 - 8.1 g/dL 7.2  Total Bilirubin 0.3 - 1.2 mg/dL 0.5  Alkaline Phos 38 - 126 U/L 86  AST 15 - 41 U/L 20  ALT 0 - 44 U/L 13   CBC Latest Ref Rng & Units 05/17/2019  WBC 4.0 - 10.5 K/uL 10.6(H)  Hemoglobin 13.0 - 17.0 g/dL 12.6(L)  Hematocrit 39.0 - 52.0 % 39.4  Platelets 150 - 400 K/uL 177    No images are attached to the encounter.  Ct Chest W Contrast  Result Date: 04/26/2019 CLINICAL DATA:  Restaging metastatic colon cancer EXAM: CT CHEST, ABDOMEN, AND PELVIS WITH CONTRAST TECHNIQUE: Multidetector CT imaging of the chest, abdomen and pelvis was performed following the standard protocol during bolus administration of intravenous contrast.  CONTRAST:  146m OMNIPAQUE IOHEXOL 300 MG/ML SOLN, additional oral enteric contrast COMPARISON:  CT chest abdomen pelvis,, 12/13/2018 09/17/2018, 06/18/2018, PET-CT, 03/19/2018 FINDINGS: CT CHEST FINDINGS Cardiovascular: Left chest port catheter. Three-vessel coronary artery calcifications. Aortic atherosclerosis. Normal heart size. No pericardial effusion. Mediastinum/Nodes: No enlarged mediastinal, hilar, or axillary lymph nodes. Thyroid gland, trachea, and esophagus demonstrate no significant findings. Lungs/Pleura: A lobulated perihilar nodule of the lingula is slightly enlarged compared to prior examination, measuring 1.3 x 1.2 cm, previously 1.2 x 0.8 cm when measured similarly (series 4, image 79). A previously biopsied lingular nodule appears slightly more full although is not increased by measurement at 1.4 x 1.0 cm (series 4, image 87). A small nodule of the left upper lobe is slightly enlarged at 6 mm, previously 5 mm (series 4, image 64). Unchanged small nodule of the right lower lobe (series 4, image 111). No new nodules appreciated. No pleural effusion or pneumothorax. Musculoskeletal: No chest wall mass or suspicious bone lesions identified. CT ABDOMEN PELVIS FINDINGS Hepatobiliary: No solid liver abnormality is seen. Gallstones near the gallbladder neck. No gallbladder wall thickening, or biliary dilatation. Pancreas: Unremarkable. No pancreatic ductal dilatation or surrounding inflammatory changes. Spleen: Normal in size without significant abnormality. Adrenals/Urinary Tract: Adrenal glands are unremarkable. Punctuate nonobstructive calculus of the superior pole of the right kidney. Bladder is unremarkable. Stomach/Bowel: Stomach is within normal limits. Appendix appears normal. No evidence of bowel wall thickening, distention, or inflammatory changes. Evidence of prior sigmoid colon resection and anastomosis. Vascular/Lymphatic: Aortic atherosclerosis. No enlarged abdominal or pelvic lymph nodes.  Reproductive: No mass or other abnormality. Other: No abdominal wall hernia or abnormality. No abdominopelvic ascites.  Musculoskeletal: No acute or significant osseous findings. IMPRESSION: 1. Slight interval enlargement of multiple small nodules. A lobulated perihilar nodule of the lingula is slightly enlarged compared to prior examination, measuring 1.3 x 1.2 cm, previously 1.2 x 0.8 cm when measured similarly (series 4, image 79). A previously biopsied lingular nodule appears slightly more full although is not increased by measurement at 1.4 x 1.0 cm (series 4, image 87). A small nodule of the left upper lobe is slightly enlarged at 6 mm, previously 5 mm (series 4, image 64). Unchanged small nodule of the right lower lobe (series 4, image 111). No new nodules appreciated. Findings are consistent with enlarging metastases. 2. No other evidence of metastatic disease in the chest, abdomen, or pelvis. 3.  Status post sigmoid colon resection and anastomosis. 4. Other chronic, incidental, and postoperative findings as detailed above. Electronically Signed   By: Eddie Candle M.D.   On: 04/26/2019 16:07   Ct Abdomen Pelvis W Contrast  Result Date: 04/26/2019 CLINICAL DATA:  Restaging metastatic colon cancer EXAM: CT CHEST, ABDOMEN, AND PELVIS WITH CONTRAST TECHNIQUE: Multidetector CT imaging of the chest, abdomen and pelvis was performed following the standard protocol during bolus administration of intravenous contrast. CONTRAST:  177m OMNIPAQUE IOHEXOL 300 MG/ML SOLN, additional oral enteric contrast COMPARISON:  CT chest abdomen pelvis,, 12/13/2018 09/17/2018, 06/18/2018, PET-CT, 03/19/2018 FINDINGS: CT CHEST FINDINGS Cardiovascular: Left chest port catheter. Three-vessel coronary artery calcifications. Aortic atherosclerosis. Normal heart size. No pericardial effusion. Mediastinum/Nodes: No enlarged mediastinal, hilar, or axillary lymph nodes. Thyroid gland, trachea, and esophagus demonstrate no significant  findings. Lungs/Pleura: A lobulated perihilar nodule of the lingula is slightly enlarged compared to prior examination, measuring 1.3 x 1.2 cm, previously 1.2 x 0.8 cm when measured similarly (series 4, image 79). A previously biopsied lingular nodule appears slightly more full although is not increased by measurement at 1.4 x 1.0 cm (series 4, image 87). A small nodule of the left upper lobe is slightly enlarged at 6 mm, previously 5 mm (series 4, image 64). Unchanged small nodule of the right lower lobe (series 4, image 111). No new nodules appreciated. No pleural effusion or pneumothorax. Musculoskeletal: No chest wall mass or suspicious bone lesions identified. CT ABDOMEN PELVIS FINDINGS Hepatobiliary: No solid liver abnormality is seen. Gallstones near the gallbladder neck. No gallbladder wall thickening, or biliary dilatation. Pancreas: Unremarkable. No pancreatic ductal dilatation or surrounding inflammatory changes. Spleen: Normal in size without significant abnormality. Adrenals/Urinary Tract: Adrenal glands are unremarkable. Punctuate nonobstructive calculus of the superior pole of the right kidney. Bladder is unremarkable. Stomach/Bowel: Stomach is within normal limits. Appendix appears normal. No evidence of bowel wall thickening, distention, or inflammatory changes. Evidence of prior sigmoid colon resection and anastomosis. Vascular/Lymphatic: Aortic atherosclerosis. No enlarged abdominal or pelvic lymph nodes. Reproductive: No mass or other abnormality. Other: No abdominal wall hernia or abnormality. No abdominopelvic ascites. Musculoskeletal: No acute or significant osseous findings. IMPRESSION: 1. Slight interval enlargement of multiple small nodules. A lobulated perihilar nodule of the lingula is slightly enlarged compared to prior examination, measuring 1.3 x 1.2 cm, previously 1.2 x 0.8 cm when measured similarly (series 4, image 79). A previously biopsied lingular nodule appears slightly more  full although is not increased by measurement at 1.4 x 1.0 cm (series 4, image 87). A small nodule of the left upper lobe is slightly enlarged at 6 mm, previously 5 mm (series 4, image 64). Unchanged small nodule of the right lower lobe (series 4, image 111). No new nodules  appreciated. Findings are consistent with enlarging metastases. 2. No other evidence of metastatic disease in the chest, abdomen, or pelvis. 3.  Status post sigmoid colon resection and anastomosis. 4. Other chronic, incidental, and postoperative findings as detailed above. Electronically Signed   By: Eddie Candle M.D.   On: 04/26/2019 16:07     Assessment and plan- Patient is a 78 y.o. 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   He is here for on treatment assessment prior to cycle 19 of 5-FU Avastin chemotherapy  Counts okay to proceed with cycle 19 of 5-FU Avastin chemotherapy today.  Blood pressure is stable and urine protein currently is pending.  He will return on day 3 for pump disconnect and receive udenyca on that day.  I will see him back in 2 weeks time with CBC with differential, CMP and urine protein for cycle 20 of 5-FU Avastin chemotherapy.   His baseline creatinine runs between 1.2-1.3.  Continue to monitor I have advised him to take 50 cc of fluid daily to maintain adequate fluid intake   Visit Diagnosis 1. Malignant neoplasm of sigmoid colon ([place])   2. Encounter for antineoplastic chemotherapy   3. Encounter for monoclonal antibody treatment for malignancy      Dr. Randa Evens, MD, MPH [facility] [phone] 05/17/2019 9:02 AM

## 2019-05-19 ENCOUNTER — Inpatient Hospital Stay: Payer: Medicare Other

## 2019-05-19 ENCOUNTER — Other Ambulatory Visit: Payer: Self-pay

## 2019-05-19 VITALS — BP 122/82 | HR 78 | Temp 97.1°F | Resp 18

## 2019-05-19 DIAGNOSIS — C189 Malignant neoplasm of colon, unspecified: Secondary | ICD-10-CM

## 2019-05-19 DIAGNOSIS — C7800 Secondary malignant neoplasm of unspecified lung: Secondary | ICD-10-CM

## 2019-05-19 DIAGNOSIS — Z5111 Encounter for antineoplastic chemotherapy: Secondary | ICD-10-CM | POA: Diagnosis not present

## 2019-05-19 MED ORDER — PEGFILGRASTIM-CBQV 6 MG/0.6ML ~~LOC~~ SOSY
6.0000 mg | PREFILLED_SYRINGE | Freq: Once | SUBCUTANEOUS | Status: AC
  Administered 2019-05-19: 6 mg via SUBCUTANEOUS
  Filled 2019-05-19: qty 0.6

## 2019-05-19 MED ORDER — HEPARIN SOD (PORK) LOCK FLUSH 100 UNIT/ML IV SOLN
500.0000 [IU] | Freq: Once | INTRAVENOUS | Status: AC | PRN
  Administered 2019-05-19: 500 [IU]
  Filled 2019-05-19: qty 5

## 2019-05-31 ENCOUNTER — Inpatient Hospital Stay: Payer: Medicare Other

## 2019-05-31 ENCOUNTER — Encounter: Payer: Self-pay | Admitting: Oncology

## 2019-05-31 ENCOUNTER — Inpatient Hospital Stay (HOSPITAL_BASED_OUTPATIENT_CLINIC_OR_DEPARTMENT_OTHER): Payer: Medicare Other | Admitting: Oncology

## 2019-05-31 ENCOUNTER — Other Ambulatory Visit: Payer: Self-pay

## 2019-05-31 VITALS — BP 158/79 | HR 71 | Temp 97.1°F | Resp 18 | Wt 180.6 lb

## 2019-05-31 VITALS — BP 138/71 | HR 67 | Resp 18

## 2019-05-31 DIAGNOSIS — Z5112 Encounter for antineoplastic immunotherapy: Secondary | ICD-10-CM

## 2019-05-31 DIAGNOSIS — C189 Malignant neoplasm of colon, unspecified: Secondary | ICD-10-CM

## 2019-05-31 DIAGNOSIS — C7800 Secondary malignant neoplasm of unspecified lung: Secondary | ICD-10-CM | POA: Diagnosis not present

## 2019-05-31 DIAGNOSIS — Z95828 Presence of other vascular implants and grafts: Secondary | ICD-10-CM

## 2019-05-31 DIAGNOSIS — C187 Malignant neoplasm of sigmoid colon: Secondary | ICD-10-CM

## 2019-05-31 DIAGNOSIS — Z5111 Encounter for antineoplastic chemotherapy: Secondary | ICD-10-CM | POA: Diagnosis not present

## 2019-05-31 DIAGNOSIS — Z803 Family history of malignant neoplasm of breast: Secondary | ICD-10-CM

## 2019-05-31 DIAGNOSIS — Z85828 Personal history of other malignant neoplasm of skin: Secondary | ICD-10-CM

## 2019-05-31 DIAGNOSIS — N179 Acute kidney failure, unspecified: Secondary | ICD-10-CM

## 2019-05-31 DIAGNOSIS — G620 Drug-induced polyneuropathy: Secondary | ICD-10-CM | POA: Diagnosis not present

## 2019-05-31 DIAGNOSIS — Z79899 Other long term (current) drug therapy: Secondary | ICD-10-CM

## 2019-05-31 LAB — CBC WITH DIFFERENTIAL/PLATELET
Abs Immature Granulocytes: 0.49 10*3/uL — ABNORMAL HIGH (ref 0.00–0.07)
Basophils Absolute: 0.1 10*3/uL (ref 0.0–0.1)
Basophils Relative: 1 %
Eosinophils Absolute: 0.3 10*3/uL (ref 0.0–0.5)
Eosinophils Relative: 3 %
HCT: 38.5 % — ABNORMAL LOW (ref 39.0–52.0)
Hemoglobin: 12.5 g/dL — ABNORMAL LOW (ref 13.0–17.0)
Immature Granulocytes: 5 %
Lymphocytes Relative: 27 %
Lymphs Abs: 2.8 10*3/uL (ref 0.7–4.0)
MCH: 30.2 pg (ref 26.0–34.0)
MCHC: 32.5 g/dL (ref 30.0–36.0)
MCV: 93 fL (ref 80.0–100.0)
Monocytes Absolute: 0.5 10*3/uL (ref 0.1–1.0)
Monocytes Relative: 5 %
Neutro Abs: 6.2 10*3/uL (ref 1.7–7.7)
Neutrophils Relative %: 59 %
Platelets: 178 10*3/uL (ref 150–400)
RBC: 4.14 MIL/uL — ABNORMAL LOW (ref 4.22–5.81)
RDW: 16.2 % — ABNORMAL HIGH (ref 11.5–15.5)
WBC: 10.4 10*3/uL (ref 4.0–10.5)
nRBC: 0 % (ref 0.0–0.2)

## 2019-05-31 LAB — COMPREHENSIVE METABOLIC PANEL
ALT: 16 U/L (ref 0–44)
AST: 22 U/L (ref 15–41)
Albumin: 4 g/dL (ref 3.5–5.0)
Alkaline Phosphatase: 92 U/L (ref 38–126)
Anion gap: 12 (ref 5–15)
BUN: 14 mg/dL (ref 8–23)
CO2: 22 mmol/L (ref 22–32)
Calcium: 9.9 mg/dL (ref 8.9–10.3)
Chloride: 104 mmol/L (ref 98–111)
Creatinine, Ser: 1.3 mg/dL — ABNORMAL HIGH (ref 0.61–1.24)
GFR calc Af Amer: >60 mL/min (ref >60)
GFR calc non Af Amer: 52 mL/min — ABNORMAL LOW (ref >60)
Glucose, Bld: 197 mg/dL — ABNORMAL HIGH (ref 70–99)
Potassium: 4.4 mmol/L (ref 3.5–5.1)
Sodium: 138 mmol/L (ref 135–145)
Total Bilirubin: 0.6 mg/dL (ref 0.3–1.2)
Total Protein: 7.1 g/dL (ref 6.5–8.1)

## 2019-05-31 LAB — PROTEIN, URINE, RANDOM: Total Protein, Urine: 8 mg/dL

## 2019-05-31 MED ORDER — SODIUM CHLORIDE 0.9 % IV SOLN
2400.0000 mg/m2 | INTRAVENOUS | Status: DC
  Administered 2019-05-31: 4850 mg via INTRAVENOUS
  Filled 2019-05-31: qty 97

## 2019-05-31 MED ORDER — FLUOROURACIL CHEMO INJECTION 2.5 GM/50ML
400.0000 mg/m2 | Freq: Once | INTRAVENOUS | Status: AC
  Administered 2019-05-31: 800 mg via INTRAVENOUS
  Filled 2019-05-31: qty 16

## 2019-05-31 MED ORDER — SODIUM CHLORIDE 0.9 % IV SOLN
400.0000 mg | Freq: Once | INTRAVENOUS | Status: AC
  Administered 2019-05-31: 11:00:00 400 mg via INTRAVENOUS
  Filled 2019-05-31: qty 16

## 2019-05-31 MED ORDER — SODIUM CHLORIDE 0.9% FLUSH
10.0000 mL | Freq: Once | INTRAVENOUS | Status: AC
  Administered 2019-05-31: 10 mL via INTRAVENOUS
  Filled 2019-05-31: qty 10

## 2019-05-31 MED ORDER — DEXAMETHASONE SODIUM PHOSPHATE 10 MG/ML IJ SOLN
10.0000 mg | Freq: Once | INTRAMUSCULAR | Status: AC
  Administered 2019-05-31: 10:00:00 10 mg via INTRAVENOUS

## 2019-05-31 MED ORDER — LEUCOVORIN CALCIUM INJECTION 100 MG
20.0000 mg/m2 | Freq: Once | INTRAMUSCULAR | Status: AC
  Administered 2019-05-31: 11:00:00 40 mg via INTRAVENOUS
  Filled 2019-05-31: qty 2

## 2019-05-31 MED ORDER — PALONOSETRON HCL INJECTION 0.25 MG/5ML
0.2500 mg | Freq: Once | INTRAVENOUS | Status: AC
  Administered 2019-05-31: 0.25 mg via INTRAVENOUS
  Filled 2019-05-31: qty 5

## 2019-05-31 MED ORDER — SODIUM CHLORIDE 0.9 % IV SOLN
Freq: Once | INTRAVENOUS | Status: AC
  Administered 2019-05-31: 10:00:00 via INTRAVENOUS
  Filled 2019-05-31: qty 250

## 2019-05-31 NOTE — Progress Notes (Signed)
Patient is here for follow up he is doing well no complaints. He was asking if he is allowed to eat lettuce. He was told during chemo class he was not allowed to.

## 2019-05-31 NOTE — Progress Notes (Signed)
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 Patient is a 78 year old male who presented with evidence of bowel obstruction on 02/06/2017. At that time he had progressive abdominal distention and no bowel movement for about one week. CT abdomen showed an apple core lesion in the descending/sigmoid colon.  2. Patient underwent Hartmann's procedure for obstructing sigmoid colon mass on 02/06/2017. Preoperative CEA was 5.0  3. 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 It is 0 out of 15 lymph nodes were positive for malignancy. Perineural and lymphovascular invasion was present. Margins were negative.pT3N0. MMR stable.  4. Given that he had high risk stage II colon cancer including he presented with obstruction and has LVI and perineural invasion- adjuvant xeloda chemotherapy was recommended for 6 months.   5.Patient had evidence of hand foot syndrome after cycle 3. Cycle 4 delayed by 1 week. Topical urea cream prescribed  6. Patient completed 8 cycles of adjuvant xeloda in October 2018. polps seen in sigmoid and decending colon but negative for malignancy  7. Repeat Ct abdomen after 8 cycles showed no metastatic disease. 6 mm lung nodule noted in RLL  8.Patient had a repeat colonoscopy in October 2018 which showed polyps in his transverse and descending colon which were taken out and were negative for  high-grade dysplasia or malignancy. Patient subsequently underwent colostomy takedown procedure by Dr. Burt Knack  9. Repeat CT chest abdomen/ pelvis showed increase in the size of previously seen lung nodules largest being 1.4 cm consistent with metastatic disease. PET/CT showed mild uptake in 2 lung nodules. Others were not hypermetabolic. No other evidence of metastatic disease  10. Repeat lung biopsy consistent with colon adenocarcinoma. Comprehensive RAS panel testing showed mutant KRAS  11.Given the slow rate of growth of his colon cancer and desire to maintain his quality of life plan was to start chemotherapy after he gets a 33-monthbreak in summer. Repeat CT chest abdomen and pelvis in August 2019 showed mild increase in the size of his lung nodules. No new lung nodules are no new sites of distant metastatic disease.FOLFOX Avastin chemotherapy started on 06/22/2018.Patient completed 12 cycles of FOLFOX Avastin chemotherapy with continued response to his lung lesions. Oxaliplatin was dropped and patient is currently on 5-FU Avastin   Interval history-continues to report tingling numbness mainly in his bilateral hands and feet.  Denies any other side effects from chemotherapy  ECOG PS- 1 Pain scale- 0 Opioid associated constipation- no  Review of systems- Review of Systems  Constitutional: Negative for chills, fever, malaise/fatigue and weight loss.  HENT: Negative for congestion, ear discharge and nosebleeds.   Eyes: Negative for blurred vision.  Respiratory: Negative for cough, hemoptysis, sputum production, shortness of breath and wheezing.   Cardiovascular: Negative for chest pain, palpitations, orthopnea and claudication.  Gastrointestinal: Negative for abdominal pain, blood in stool, constipation, diarrhea, heartburn, melena, nausea and vomiting.  Genitourinary: Negative for dysuria, flank pain, frequency, hematuria and urgency.  Musculoskeletal: Negative for back pain,  joint pain and myalgias.  Skin: Negative for rash.  Neurological: Positive for sensory change (Peripheral neuropathy). Negative for dizziness, focal weakness, seizures, weakness and headaches.  Endo/Heme/Allergies: Does not bruise/bleed easily.  Psychiatric/Behavioral: Negative for depression and suicidal ideas. The patient does not have insomnia.        No Known Allergies   Past Medical History:  Diagnosis Date  . Cancer ([place])    skin cancer-nose w graft,also shoulder- basal cell per pt  . Cataract    r eye  . Chronic kidney disease    kidney stones 20 y ago per pt  . Colon cancer ([place])   . Diabetes mellitus without complication ([place])   . Eczema   . History of kidney stones   . Hyperlipidemia   . Hypertension   . Vertigo      Past Surgical History:  Procedure Laterality Date  . CATARACT EXTRACTION Left   . COLECTOMY WITH COLOSTOMY CREATION/HARTMANN PROCEDURE N/A 02/06/2017   Procedure: COLECTOMY WITH COLOSTOMY CREATION/HARTMANN PROCEDURE;  Surgeon: [name], MD;  Location: [facility] ORS;  Service: General;  Laterality: N/A;  . COLON SURGERY    . COLONOSCOPY WITH PROPOFOL N/A 09/04/2017   Procedure: COLONOSCOPY WITH PROPOFOL;  Surgeon: Jonathon Bellows, MD;  Location: [facility] ENDOSCOPY;  Service: Gastroenterology;  Laterality: N/A;  . COLOSTOMY CLOSURE N/A 10/20/2017   Procedure: COLOSTOMY CLOSURE;  Surgeon: [name], MD;  Location: [facility] ORS;  Service: General;  Laterality: N/A;  . CYSTOSCOPY    . FLEXIBLE SIGMOIDOSCOPY N/A 02/06/2017   Procedure: FLEXIBLE SIGMOIDOSCOPY;  Surgeon: Christene Lye, MD;  Location: [facility] ENDOSCOPY;  Service: Endoscopy;  Laterality: N/A;  . PORTACATH PLACEMENT N/A 06/17/2018   Procedure: INSERTION PORT-A-CATH, WITH FLUOROSCOPY;  Surgeon: [name], MD;  Location: [facility] ORS;  Service: General;  Laterality: N/A;    Social History   Socioeconomic History  . Marital status: Married    Spouse name: Not on file  . Number of  children: Not on file  . Years of education: Not on file  . Highest education level: Not on file  Occupational History  . Not on file  Social Needs  . Financial resource strain: Not on file  . Food insecurity    Worry: Not on file    Inability: Not on file  . Transportation needs    Medical: Not on file    Non-medical: Not on file  Tobacco Use  . Smoking status: Never Smoker  . Smokeless tobacco: Never Used  Substance and Sexual Activity  . Alcohol use: No  . Drug use: No  . Sexual activity: Not Currently  Lifestyle  . Physical activity    Days per week: Not on file    Minutes per session: Not on file  . Stress: Not on file  Relationships  . Social Herbalist on phone: Not on file    Gets together: Not on file    Attends religious service: Not on file    Active member of club or organization: Not on file    Attends meetings of clubs or organizations: Not on file    Relationship status: Not on file  . Intimate partner violence    Fear of current or ex partner: Not on file    Emotionally abused: Not on file    Physically abused: Not on file    Forced sexual activity: Not on file  Other Topics Concern  . Not on file  Social History Narrative  . Not on file  Family History  Problem Relation Age of Onset  . Diabetes Mother   . AAA (abdominal aortic aneurysm) Mother   . Coronary artery disease Mother   . Diabetes Father   . Coronary artery disease Father   . Dementia Father   . Breast cancer Sister      Current Outpatient Medications:  .  acetaminophen (TYLENOL) 500 MG tablet, Take 500 mg by mouth every 6 (six) hours as needed (for pain.)., Disp: , Rfl:  .  aspirin EC 81 MG tablet, Take 81 mg by mouth daily., Disp: , Rfl:  .  Docusate Sodium (COLACE PO), Take 1 Dose by mouth as needed., Disp: , Rfl:  .  Dulaglutide 0.75 MG/0.5ML SOPN, Inject into the skin once a week. , Disp: , Rfl:  .  gabapentin (NEURONTIN) 300 MG capsule, Take 1 capsule (300 mg  total) by mouth 3 (three) times daily., Disp: 90 capsule, Rfl: 3 .  glimepiride (AMARYL) 2 MG tablet, Take 2 mg by mouth 2 (two) times daily before a meal. , Disp: , Rfl:  .  loratadine (CLARITIN) 10 MG tablet, Take 10 mg by mouth daily., Disp: , Rfl:  .  losartan (COZAAR) 25 MG tablet, Take 25 mg by mouth daily., Disp: , Rfl:  .  metFORMIN (GLUCOPHAGE) 1000 MG tablet, Take 1,000-1,500 tablets by mouth See admin instructions. TAKE 1 TABLET (1000 MG) BY MOUTH IN THE MORNING & TAKE 1.5 TABLETS (1500 MG) BY MOUTH AT SUPPER, Disp: , Rfl:  .  pravastatin (PRAVACHOL) 20 MG tablet, Take 20 mg by mouth daily with supper. , Disp: , Rfl:  .  lidocaine-prilocaine (EMLA) cream, Place a small amount over cream over port 1 hour before each treatment, cover cream with saran wrap for clothing protection (Patient not taking: Reported on 05/17/2019), Disp: 30 g, Rfl: 1 .  ondansetron (ZOFRAN) 8 MG tablet, Take 1 tablet (8 mg total) by mouth 2 (two) times daily as needed for refractory nausea / vomiting. Start on day 3 after chemotherapy. (Patient not taking: Reported on 04/12/2019), Disp: 30 tablet, Rfl: 1 .  prochlorperazine (COMPAZINE) 10 MG tablet, Take 1 tablet (10 mg total) by mouth every 6 (six) hours as needed (Nausea or vomiting). (Patient not taking: Reported on 04/12/2019), Disp: 30 tablet, Rfl: 1 .  triamcinolone (KENALOG) 0.025 % cream, Apply 1 application topically daily as needed (FOR EZCEMA). (Patient not taking: Reported on 05/03/2019), Disp: 30 g, Rfl: 2 No current facility-administered medications for this visit.   Facility-Administered Medications Ordered in Other Visits:  .  0.9 %  sodium chloride infusion, , Intravenous, Continuous, Sindy Guadeloupe, MD, Stopped at 07/27/18 1037 .  fluorouracil (ADRUCIL) 4,850 mg in sodium chloride 0.9 % 53 mL chemo infusion, 2,400 mg/m2 (Treatment Plan Recorded), Intravenous, 1 day or 1 dose, Sindy Guadeloupe, MD, 4,850 mg at 05/31/19 1125 .  heparin lock flush 100 unit/mL,  500 Units, Intravenous, Once, Sindy Guadeloupe, MD .  sodium chloride flush (NS) 0.9 % injection 10 mL, 10 mL, Intravenous, PRN, Sindy Guadeloupe, MD, 10 mL at 07/27/18 0920  Physical exam:  Vitals:   05/31/19 0919  BP: (!) 158/79  Pulse: 71  Resp: 18  Temp: (!) 97.1 F (36.2 C)  Weight: 180 lb 9.6 oz (81.9 kg)   Physical Exam HENT:     Head: Normocephalic and atraumatic.  Eyes:     Pupils: Pupils are equal, round, and reactive to light.  Neck:     Musculoskeletal: Normal range  of motion.  Cardiovascular:     Rate and Rhythm: Normal rate and regular rhythm.     Heart sounds: Normal heart sounds.  Pulmonary:     Effort: Pulmonary effort is normal.     Breath sounds: Normal breath sounds.  Abdominal:     General: Bowel sounds are normal.     Palpations: Abdomen is soft.  Skin:    General: Skin is warm and dry.  Neurological:     Mental Status: He is alert and oriented to person, place, and time.      CMP Latest Ref Rng & Units 05/31/2019  Glucose 70 - 99 mg/dL 197(H)  BUN 8 - 23 mg/dL 14  Creatinine 0.61 - 1.24 mg/dL 1.30(H)  Sodium 135 - 145 mmol/L 138  Potassium 3.5 - 5.1 mmol/L 4.4  Chloride 98 - 111 mmol/L 104  CO2 22 - 32 mmol/L 22  Calcium 8.9 - 10.3 mg/dL 9.9  Total Protein 6.5 - 8.1 g/dL 7.1  Total Bilirubin 0.3 - 1.2 mg/dL 0.6  Alkaline Phos 38 - 126 U/L 92  AST 15 - 41 U/L 22  ALT 0 - 44 U/L 16   CBC Latest Ref Rng & Units 05/31/2019  WBC 4.0 - 10.5 K/uL 10.4  Hemoglobin 13.0 - 17.0 g/dL 12.5(L)  Hematocrit 39.0 - 52.0 % 38.5(L)  Platelets 150 - 400 K/uL 178     Assessment and plan- Patient is a 78 y.o. male with stage IV colon cancer and lung metastases.  He is here for on treatment assessment prior to cycle 20 of palliative 5-FU Avastin chemotherapy  Counts okay to proceed with cycle 20 of palliative 5-FU Avastin chemotherapy today.  He will come back on day 3 for pump DC and will receive Udenyca on that day.  I will see him back in 2 weeks time with  CBC with differential, CMP for cycle 21.  Blood pressure is currently stable and urine protein is trace.  Continue to monitor  Over the last 2 to 3 months patient has had AKI with a creatinine of around 1.3.  Again encouraged the patient to keep up with his fluid intake.  Continue to monitor  Chemo-induced peripheral neuropathy: I have asked him to increase his gabapentin dose from 300 mg twice daily to 600 mg 3 times daily or twice daily as tolerated and see if symptoms improve   Visit Diagnosis 1. 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[place])   2. Malignant neoplasm metastatic to lung, unspecified laterality ([place])   3. Encounter for monoclonal antibody treatment for malignancy   4. Encounter for antineoplastic chemotherapy      Dr. Randa Evens, MD, MPH [facility] [phone] 05/31/2019 1:00 PM

## 2019-06-02 ENCOUNTER — Other Ambulatory Visit: Payer: Self-pay

## 2019-06-02 ENCOUNTER — Inpatient Hospital Stay: Payer: Medicare Other

## 2019-06-02 VITALS — BP 140/74 | HR 73 | Temp 96.7°F | Resp 20

## 2019-06-02 DIAGNOSIS — C7800 Secondary malignant neoplasm of unspecified lung: Secondary | ICD-10-CM

## 2019-06-02 DIAGNOSIS — C189 Malignant neoplasm of colon, unspecified: Secondary | ICD-10-CM

## 2019-06-02 DIAGNOSIS — Z5111 Encounter for antineoplastic chemotherapy: Secondary | ICD-10-CM | POA: Diagnosis not present

## 2019-06-02 MED ORDER — SODIUM CHLORIDE 0.9% FLUSH
10.0000 mL | INTRAVENOUS | Status: DC | PRN
  Administered 2019-06-02: 12:00:00 10 mL
  Filled 2019-06-02: qty 10

## 2019-06-02 MED ORDER — HEPARIN SOD (PORK) LOCK FLUSH 100 UNIT/ML IV SOLN
500.0000 [IU] | Freq: Once | INTRAVENOUS | Status: AC | PRN
  Administered 2019-06-02: 500 [IU]
  Filled 2019-06-02: qty 5

## 2019-06-02 MED ORDER — PEGFILGRASTIM-CBQV 6 MG/0.6ML ~~LOC~~ SOSY
6.0000 mg | PREFILLED_SYRINGE | Freq: Once | SUBCUTANEOUS | Status: AC
  Administered 2019-06-02: 6 mg via SUBCUTANEOUS
  Filled 2019-06-02: qty 0.6

## 2019-06-13 ENCOUNTER — Other Ambulatory Visit: Payer: Self-pay

## 2019-06-14 ENCOUNTER — Inpatient Hospital Stay (HOSPITAL_BASED_OUTPATIENT_CLINIC_OR_DEPARTMENT_OTHER): Payer: Medicare Other | Admitting: Oncology

## 2019-06-14 ENCOUNTER — Telehealth: Payer: Self-pay | Admitting: *Deleted

## 2019-06-14 ENCOUNTER — Other Ambulatory Visit: Payer: Self-pay

## 2019-06-14 ENCOUNTER — Inpatient Hospital Stay: Payer: Medicare Other

## 2019-06-14 ENCOUNTER — Inpatient Hospital Stay: Payer: Medicare Other | Attending: Oncology

## 2019-06-14 ENCOUNTER — Encounter: Payer: Self-pay | Admitting: Oncology

## 2019-06-14 VITALS — BP 121/75 | HR 70 | Temp 97.4°F | Resp 16 | Wt 180.0 lb

## 2019-06-14 DIAGNOSIS — Z5112 Encounter for antineoplastic immunotherapy: Secondary | ICD-10-CM | POA: Insufficient documentation

## 2019-06-14 DIAGNOSIS — Z5111 Encounter for antineoplastic chemotherapy: Secondary | ICD-10-CM | POA: Diagnosis not present

## 2019-06-14 DIAGNOSIS — E782 Mixed hyperlipidemia: Secondary | ICD-10-CM

## 2019-06-14 DIAGNOSIS — C189 Malignant neoplasm of colon, unspecified: Secondary | ICD-10-CM

## 2019-06-14 DIAGNOSIS — E785 Hyperlipidemia, unspecified: Secondary | ICD-10-CM | POA: Insufficient documentation

## 2019-06-14 DIAGNOSIS — C7800 Secondary malignant neoplasm of unspecified lung: Secondary | ICD-10-CM

## 2019-06-14 DIAGNOSIS — C187 Malignant neoplasm of sigmoid colon: Secondary | ICD-10-CM

## 2019-06-14 DIAGNOSIS — Z5189 Encounter for other specified aftercare: Secondary | ICD-10-CM | POA: Insufficient documentation

## 2019-06-14 DIAGNOSIS — E119 Type 2 diabetes mellitus without complications: Secondary | ICD-10-CM | POA: Insufficient documentation

## 2019-06-14 DIAGNOSIS — Z95828 Presence of other vascular implants and grafts: Secondary | ICD-10-CM

## 2019-06-14 LAB — COMPREHENSIVE METABOLIC PANEL
ALT: 17 U/L (ref 0–44)
AST: 23 U/L (ref 15–41)
Albumin: 4.4 g/dL (ref 3.5–5.0)
Alkaline Phosphatase: 102 U/L (ref 38–126)
Anion gap: 10 (ref 5–15)
BUN: 21 mg/dL (ref 8–23)
CO2: 22 mmol/L (ref 22–32)
Calcium: 9.7 mg/dL (ref 8.9–10.3)
Chloride: 106 mmol/L (ref 98–111)
Creatinine, Ser: 1.55 mg/dL — ABNORMAL HIGH (ref 0.61–1.24)
GFR calc Af Amer: 49 mL/min — ABNORMAL LOW (ref >60)
GFR calc non Af Amer: 42 mL/min — ABNORMAL LOW (ref >60)
Glucose, Bld: 220 mg/dL — ABNORMAL HIGH (ref 70–99)
Potassium: 4.6 mmol/L (ref 3.5–5.1)
Sodium: 138 mmol/L (ref 135–145)
Total Bilirubin: 0.7 mg/dL (ref 0.3–1.2)
Total Protein: 7.4 g/dL (ref 6.5–8.1)

## 2019-06-14 LAB — CBC WITH DIFFERENTIAL/PLATELET
Abs Immature Granulocytes: 0.13 10*3/uL — ABNORMAL HIGH (ref 0.00–0.07)
Basophils Absolute: 0.1 10*3/uL (ref 0.0–0.1)
Basophils Relative: 0 %
Eosinophils Absolute: 0.5 10*3/uL (ref 0.0–0.5)
Eosinophils Relative: 4 %
HCT: 38.4 % — ABNORMAL LOW (ref 39.0–52.0)
Hemoglobin: 12.2 g/dL — ABNORMAL LOW (ref 13.0–17.0)
Immature Granulocytes: 1 %
Lymphocytes Relative: 26 %
Lymphs Abs: 3 10*3/uL (ref 0.7–4.0)
MCH: 29.7 pg (ref 26.0–34.0)
MCHC: 31.8 g/dL (ref 30.0–36.0)
MCV: 93.4 fL (ref 80.0–100.0)
Monocytes Absolute: 0.6 10*3/uL (ref 0.1–1.0)
Monocytes Relative: 5 %
Neutro Abs: 7.3 10*3/uL (ref 1.7–7.7)
Neutrophils Relative %: 64 %
Platelets: 188 10*3/uL (ref 150–400)
RBC: 4.11 MIL/uL — ABNORMAL LOW (ref 4.22–5.81)
RDW: 16.7 % — ABNORMAL HIGH (ref 11.5–15.5)
WBC: 11.5 10*3/uL — ABNORMAL HIGH (ref 4.0–10.5)
nRBC: 0 % (ref 0.0–0.2)

## 2019-06-14 MED ORDER — LEUCOVORIN CALCIUM INJECTION 100 MG
20.0000 mg/m2 | Freq: Once | INTRAMUSCULAR | Status: AC
  Administered 2019-06-14: 40 mg via INTRAVENOUS
  Filled 2019-06-14: qty 2

## 2019-06-14 MED ORDER — FLUOROURACIL CHEMO INJECTION 2.5 GM/50ML
400.0000 mg/m2 | Freq: Once | INTRAVENOUS | Status: AC
  Administered 2019-06-14: 800 mg via INTRAVENOUS
  Filled 2019-06-14: qty 16

## 2019-06-14 MED ORDER — PALONOSETRON HCL INJECTION 0.25 MG/5ML
0.2500 mg | Freq: Once | INTRAVENOUS | Status: AC
  Administered 2019-06-14: 0.25 mg via INTRAVENOUS
  Filled 2019-06-14: qty 5

## 2019-06-14 MED ORDER — DEXAMETHASONE SODIUM PHOSPHATE 10 MG/ML IJ SOLN
10.0000 mg | Freq: Once | INTRAMUSCULAR | Status: AC
  Administered 2019-06-14: 10 mg via INTRAVENOUS
  Filled 2019-06-14: qty 1

## 2019-06-14 MED ORDER — SODIUM CHLORIDE 0.9 % IV SOLN
2400.0000 mg/m2 | INTRAVENOUS | Status: DC
  Administered 2019-06-14: 4850 mg via INTRAVENOUS
  Filled 2019-06-14: qty 97

## 2019-06-14 MED ORDER — SODIUM CHLORIDE 0.9% FLUSH
10.0000 mL | Freq: Once | INTRAVENOUS | Status: AC
  Administered 2019-06-14: 10 mL via INTRAVENOUS
  Filled 2019-06-14: qty 10

## 2019-06-14 MED ORDER — HEPARIN SOD (PORK) LOCK FLUSH 100 UNIT/ML IV SOLN: 500.0000 [IU] | Freq: Once | INTRAVENOUS | Status: DC

## 2019-06-14 MED ORDER — SODIUM CHLORIDE 0.9 % IV SOLN
400.0000 mg | Freq: Once | INTRAVENOUS | Status: AC
  Administered 2019-06-14: 400 mg via INTRAVENOUS
  Filled 2019-06-14: qty 16

## 2019-06-14 MED ORDER — SODIUM CHLORIDE 0.9 % IV SOLN
INTRAVENOUS | Status: DC
  Administered 2019-06-14: 10:00:00 via INTRAVENOUS
  Filled 2019-06-14: qty 250

## 2019-06-14 NOTE — Progress Notes (Signed)
Cr 1.55 ok to proceed per md

## 2019-06-14 NOTE — Progress Notes (Signed)
Creatinine slightly elevated above treatment parameters at 1.55, ok to proceed with treatment per Dr. [name]

## 2019-06-14 NOTE — Progress Notes (Signed)
Patient is here for follow up, he is doing well no complaints

## 2019-06-14 NOTE — Telephone Encounter (Signed)
Pt called and said that he is having 1 year labs on 8/17 and then seeing [name] 8/18 and wanted to see if the labs could be done at cancer center and Dr. [name] said yes so I called and the [facility] clinic was going to draw met c, hgb a1c, and lipid profile. I have ordered that for his next time in clinic and called [facility] clinic and told them to cancel the  Lab appt 8/17 I was going to get them done here. Pt. Aware of above and is happy to get blood work at one place. I did remind pt that he needs to be NPO for 8 hours.

## 2019-06-15 NOTE — Progress Notes (Signed)
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 Patient is a 78 year old male who presented with evidence of bowel obstruction on 02/06/2017. At that time he had progressive abdominal distention and no bowel movement for about one week. CT abdomen showed an apple core lesion in the descending/sigmoid colon.  2. Patient underwent Hartmann's procedure for obstructing sigmoid colon mass on 02/06/2017. Preoperative CEA was 5.0  3. 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 It is 0 out of 15 lymph nodes were positive for malignancy. Perineural and lymphovascular invasion was present. Margins were negative.pT3N0. MMR stable.  4. Given that he had high risk stage II colon cancer including he presented with obstruction and has LVI and perineural invasion- adjuvant xeloda chemotherapy was recommended for 6 months.   5.Patient had evidence of hand foot syndrome after cycle 3. Cycle 4 delayed by 1 week. Topical urea cream prescribed  6. Patient completed 8 cycles of adjuvant xeloda in October 2018. polps seen in sigmoid and decending colon but negative for malignancy  7. Repeat Ct abdomen after 8 cycles showed no metastatic disease. 6 mm lung nodule noted in RLL  8.Patient had a repeat colonoscopy in October 2018 which showed polyps in his transverse and descending colon which were taken out and were negative for  high-grade dysplasia or malignancy. Patient subsequently underwent colostomy takedown procedure by Dr. Burt Knack  9. Repeat CT chest abdomen/ pelvis showed increase in the size of previously seen lung nodules largest being 1.4 cm consistent with metastatic disease. PET/CT showed mild uptake in 2 lung nodules. Others were not hypermetabolic. No other evidence of metastatic disease  10. Repeat lung biopsy consistent with colon adenocarcinoma. Comprehensive RAS panel testing showed mutant KRAS  11.Given the slow rate of growth of his colon cancer and desire to maintain his quality of life plan was to start chemotherapy after he gets a 67-monthbreak in summer. Repeat CT chest abdomen and pelvis in August 2019 showed mild increase in the size of his lung nodules. No new lung nodules are no new sites of distant metastatic disease.FOLFOX Avastin chemotherapy started on 06/22/2018.Patient completed 12 cycles of FOLFOX Avastin chemotherapy with continued response to his lung lesions. 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  Denies other complaints at this time  ECOG PS- 1 Pain scale- 0   Review of systems- Review of Systems  Constitutional: Negative for chills, fever, malaise/fatigue and weight loss.  HENT: Negative for congestion, ear discharge and nosebleeds.   Eyes: Negative for blurred vision.  Respiratory: Negative for cough, hemoptysis, sputum production, shortness of breath and wheezing.   Cardiovascular: Negative for chest pain, palpitations, orthopnea and claudication.  Gastrointestinal: Negative for abdominal pain, blood in stool, constipation, diarrhea, heartburn, melena, nausea and vomiting.  Genitourinary: Negative for dysuria, flank pain, frequency, hematuria and urgency.  Musculoskeletal: Negative for back pain, joint pain and  myalgias.  Skin: Negative for rash.  Neurological: Positive for sensory change (peripheral neuropathy). Negative for dizziness, tingling, focal weakness, seizures, weakness and headaches.  Endo/Heme/Allergies: Does not bruise/bleed easily.  Psychiatric/Behavioral: Negative for depression and suicidal ideas. The patient does not have insomnia.      No Known Allergies   Past Medical History:  Diagnosis Date  . Cancer ([place])    skin cancer-nose w graft,also shoulder- basal cell per pt  . Cataract    r eye  . Chronic kidney disease    kidney stones 20 y ago per pt  . Colon cancer ([place])   . Diabetes mellitus without complication ([place])   . Eczema   . History of kidney stones   . Hyperlipidemia   . Hypertension   . Vertigo      Past Surgical History:  Procedure Laterality Date  . CATARACT EXTRACTION Left   . COLECTOMY WITH COLOSTOMY CREATION/HARTMANN PROCEDURE N/A 02/06/2017   Procedure: COLECTOMY WITH COLOSTOMY CREATION/HARTMANN PROCEDURE;  Surgeon: [name], MD;  Location: [facility] ORS;  Service: General;  Laterality: N/A;  . COLON SURGERY    . COLONOSCOPY WITH PROPOFOL N/A 09/04/2017   Procedure: COLONOSCOPY WITH PROPOFOL;  Surgeon: Jonathon Bellows, MD;  Location: [facility] ENDOSCOPY;  Service: Gastroenterology;  Laterality: N/A;  . COLOSTOMY CLOSURE N/A 10/20/2017   Procedure: COLOSTOMY CLOSURE;  Surgeon: [name], MD;  Location: [facility] ORS;  Service: General;  Laterality: N/A;  . CYSTOSCOPY    . FLEXIBLE SIGMOIDOSCOPY N/A 02/06/2017   Procedure: FLEXIBLE SIGMOIDOSCOPY;  Surgeon: Christene Lye, MD;  Location: [facility] ENDOSCOPY;  Service: Endoscopy;  Laterality: N/A;  . PORTACATH PLACEMENT N/A 06/17/2018   Procedure: INSERTION PORT-A-CATH, WITH FLUOROSCOPY;  Surgeon: [name], MD;  Location: [facility] ORS;  Service: General;  Laterality: N/A;    Social History   Socioeconomic History  . Marital status: Married    Spouse name: Not on file  . Number of children: Not  on file  . Years of education: Not on file  . Highest education level: Not on file  Occupational History  . Not on file  Social Needs  . Financial resource strain: Not on file  . Food insecurity    Worry: Not on file    Inability: Not on file  . Transportation needs    Medical: Not on file    Non-medical: Not on file  Tobacco Use  . Smoking status: Never Smoker  . Smokeless tobacco: Never Used  Substance and Sexual Activity  . Alcohol use: No  . Drug use: No  . Sexual activity: Not Currently  Lifestyle  . Physical activity    Days per week: Not on file    Minutes per session: Not on file  . Stress: Not on file  Relationships  . Social Herbalist on phone: Not on file    Gets together: Not on file    Attends religious service: Not on file    Active member of club or organization: Not on file    Attends meetings of clubs or organizations: Not on file    Relationship status: Not on file  . Intimate partner violence    Fear of current or ex partner: Not on file    Emotionally abused: Not on file    Physically abused: Not on file    Forced sexual activity: Not on file  Other Topics Concern  . Not on file  Social History Narrative  . Not on file  Family History  Problem Relation Age of Onset  . Diabetes Mother   . AAA (abdominal aortic aneurysm) Mother   . Coronary artery disease Mother   . Diabetes Father   . Coronary artery disease Father   . Dementia Father   . Breast cancer Sister      Current Outpatient Medications:  .  acetaminophen (TYLENOL) 500 MG tablet, Take 500 mg by mouth every 6 (six) hours as needed (for pain.)., Disp: , Rfl:  .  aspirin EC 81 MG tablet, Take 81 mg by mouth daily., Disp: , Rfl:  .  Docusate Sodium (COLACE PO), Take 1 Dose by mouth as needed., Disp: , Rfl:  .  Dulaglutide 0.75 MG/0.5ML SOPN, Inject into the skin once a week. , Disp: , Rfl:  .  gabapentin (NEURONTIN) 300 MG capsule, Take 1 capsule (300 mg total) by mouth  3 (three) times daily., Disp: 90 capsule, Rfl: 3 .  glimepiride (AMARYL) 2 MG tablet, Take 2 mg by mouth 2 (two) times daily before a meal. , Disp: , Rfl:  .  lidocaine-prilocaine (EMLA) cream, Place a small amount over cream over port 1 hour before each treatment, cover cream with saran wrap for clothing protection, Disp: 30 g, Rfl: 1 .  loratadine (CLARITIN) 10 MG tablet, Take 10 mg by mouth daily., Disp: , Rfl:  .  losartan (COZAAR) 25 MG tablet, Take 25 mg by mouth daily., Disp: , Rfl:  .  metFORMIN (GLUCOPHAGE) 1000 MG tablet, Take 1,000-1,500 tablets by mouth See admin instructions. TAKE 1 TABLET (1000 MG) BY MOUTH IN THE MORNING & TAKE 1.5 TABLETS (1500 MG) BY MOUTH AT SUPPER, Disp: , Rfl:  .  ondansetron (ZOFRAN) 8 MG tablet, Take 1 tablet (8 mg total) by mouth 2 (two) times daily as needed for refractory nausea / vomiting. Start on day 3 after chemotherapy., Disp: 30 tablet, Rfl: 1 .  pravastatin (PRAVACHOL) 20 MG tablet, Take 20 mg by mouth daily with supper. , Disp: , Rfl:  .  prochlorperazine (COMPAZINE) 10 MG tablet, Take 1 tablet (10 mg total) by mouth every 6 (six) hours as needed (Nausea or vomiting)., Disp: 30 tablet, Rfl: 1 .  triamcinolone (KENALOG) 0.025 % cream, Apply 1 application topically daily as needed (FOR EZCEMA)., Disp: 30 g, Rfl: 2 No current facility-administered medications for this visit.   Facility-Administered Medications Ordered in Other Visits:  .  0.9 %  sodium chloride infusion, , Intravenous, Continuous, Sindy Guadeloupe, MD, Stopped at 07/27/18 1037 .  heparin lock flush 100 unit/mL, 500 Units, Intravenous, Once, Sindy Guadeloupe, MD .  sodium chloride flush (NS) 0.9 % injection 10 mL, 10 mL, Intravenous, PRN, Sindy Guadeloupe, MD, 10 mL at 07/27/18 0920  Physical exam:  Vitals:   06/14/19 0857  BP: 121/75  Pulse: 70  Resp: 16  Temp: (!) 97.4 F (36.3 C)  TempSrc: Tympanic  Weight: 180 lb (81.6 kg)   Physical Exam Constitutional:      General: He is  not in acute distress. HENT:     Head: Normocephalic and atraumatic.  Eyes:     Pupils: Pupils are equal, round, and reactive to light.  Neck:     Musculoskeletal: Normal range of motion.  Cardiovascular:     Rate and Rhythm: Normal rate and regular rhythm.     Heart sounds: Normal heart sounds.  Pulmonary:     Effort: Pulmonary effort is normal.     Breath sounds: Normal breath sounds.  Abdominal:     General: Bowel sounds are normal.     Palpations: Abdomen is soft.  Skin:    General: Skin is warm and dry.  Neurological:     Mental Status: He is alert and oriented to person, place, and time.      CMP Latest Ref Rng & Units 06/14/2019  Glucose 70 - 99 mg/dL 220(H)  BUN 8 - 23 mg/dL 21  Creatinine 0.61 - 1.24 mg/dL 1.55(H)  Sodium 135 - 145 mmol/L 138  Potassium 3.5 - 5.1 mmol/L 4.6  Chloride 98 - 111 mmol/L 106  CO2 22 - 32 mmol/L 22  Calcium 8.9 - 10.3 mg/dL 9.7  Total Protein 6.5 - 8.1 g/dL 7.4  Total Bilirubin 0.3 - 1.2 mg/dL 0.7  Alkaline Phos 38 - 126 U/L 102  AST 15 - 41 U/L 23  ALT 0 - 44 U/L 17   CBC Latest Ref Rng & Units 06/14/2019  WBC 4.0 - 10.5 K/uL 11.5(H)  Hemoglobin 13.0 - 17.0 g/dL 12.2(L)  Hematocrit 39.0 - 52.0 % 38.4(L)  Platelets 150 - 400 K/uL 188     Assessment and plan- Patient is a 78 y.o. 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   Here for on treatment assessment prior to cycle 20 of 5-FU Avastin chemotherapy  Patient blood pressure is well controlled and urine protein is trace.  However his serum creatinine continues to trend up and is up to 1.5-day.  It is unclear if this is secondary to inadequate fluid intake are secondary to a Avastin itself which can cause elevated creatinine in about 16% percent of the cases.    I will proceed with 5-FU Avastin chemotherapy today.  Patient will come back on day 3 for pump disconnect and receive udenyca on that day.  We will also give him 1 L of IV fluids today.  Encouraged patient  to keep up with his fluid intake to about 40 ounces per day and attempt to take non-caffeinated drinks.  Patient verbalized understanding  I will see him back in 2 weeks time with CBC with differential, CMP and urine protein for cycle 21 of 5-FU Avastin chemotherapy   Visit Diagnosis 1. Malignant neoplasm of sigmoid colon ([place])   2. Encounter for antineoplastic chemotherapy   3. Encounter for monoclonal antibody treatment for malignancy      Dr. Randa Evens, MD, MPH [facility] [phone] 06/15/2019 12:17 PM

## 2019-06-16 ENCOUNTER — Inpatient Hospital Stay: Payer: Medicare Other

## 2019-06-16 ENCOUNTER — Other Ambulatory Visit: Payer: Self-pay

## 2019-06-16 VITALS — BP 126/70 | HR 72 | Temp 97.0°F | Resp 18

## 2019-06-16 DIAGNOSIS — C189 Malignant neoplasm of colon, unspecified: Secondary | ICD-10-CM

## 2019-06-16 DIAGNOSIS — C7800 Secondary malignant neoplasm of unspecified lung: Secondary | ICD-10-CM

## 2019-06-16 DIAGNOSIS — Z5111 Encounter for antineoplastic chemotherapy: Secondary | ICD-10-CM | POA: Diagnosis not present

## 2019-06-16 LAB — PROTEIN ELECTRO, RANDOM URINE
Albumin ELP, Urine: 100 %
Alpha-1-Globulin, U: 0 %
Alpha-2-Globulin, U: 0 %
Beta Globulin, U: 0 %
Gamma Globulin, U: 0 %
Total Protein, Urine: <4 mg/dL

## 2019-06-16 MED ORDER — PEGFILGRASTIM-CBQV 6 MG/0.6ML ~~LOC~~ SOSY
6.0000 mg | PREFILLED_SYRINGE | Freq: Once | SUBCUTANEOUS | Status: AC
  Administered 2019-06-16: 11:00:00 6 mg via SUBCUTANEOUS
  Filled 2019-06-16: qty 0.6

## 2019-06-16 MED ORDER — HEPARIN SOD (PORK) LOCK FLUSH 100 UNIT/ML IV SOLN
500.0000 [IU] | Freq: Once | INTRAVENOUS | Status: AC | PRN
  Administered 2019-06-16: 500 [IU]
  Filled 2019-06-16: qty 5

## 2019-06-28 ENCOUNTER — Other Ambulatory Visit: Payer: Self-pay | Admitting: Oncology

## 2019-06-28 ENCOUNTER — Other Ambulatory Visit: Payer: Self-pay

## 2019-06-28 ENCOUNTER — Inpatient Hospital Stay (HOSPITAL_BASED_OUTPATIENT_CLINIC_OR_DEPARTMENT_OTHER): Payer: Medicare Other | Admitting: Oncology

## 2019-06-28 ENCOUNTER — Inpatient Hospital Stay: Payer: Medicare Other

## 2019-06-28 ENCOUNTER — Encounter: Payer: Self-pay | Admitting: Oncology

## 2019-06-28 VITALS — BP 133/78 | HR 73 | Temp 96.6°F | Resp 18 | Wt 182.2 lb

## 2019-06-28 DIAGNOSIS — C187 Malignant neoplasm of sigmoid colon: Secondary | ICD-10-CM

## 2019-06-28 DIAGNOSIS — C189 Malignant neoplasm of colon, unspecified: Secondary | ICD-10-CM

## 2019-06-28 DIAGNOSIS — Z5111 Encounter for antineoplastic chemotherapy: Secondary | ICD-10-CM | POA: Diagnosis not present

## 2019-06-28 DIAGNOSIS — E119 Type 2 diabetes mellitus without complications: Secondary | ICD-10-CM

## 2019-06-28 DIAGNOSIS — C7800 Secondary malignant neoplasm of unspecified lung: Secondary | ICD-10-CM

## 2019-06-28 LAB — LIPID PANEL
Cholesterol: 141 mg/dL (ref 0–200)
HDL: 38 mg/dL — ABNORMAL LOW (ref >40)
LDL Cholesterol: 68 mg/dL (ref 0–99)
Total CHOL/HDL Ratio: 3.7 RATIO
Triglycerides: 177 mg/dL — ABNORMAL HIGH (ref <150)
VLDL: 35 mg/dL (ref 0–40)

## 2019-06-28 LAB — HEMOGLOBIN A1C
Hgb A1c MFr Bld: 7.5 % — ABNORMAL HIGH (ref 4.8–5.6)
Mean Plasma Glucose: 168.55 mg/dL

## 2019-06-28 LAB — PROTEIN, URINE, RANDOM: Total Protein, Urine: 7 mg/dL

## 2019-06-28 LAB — CBC WITH DIFFERENTIAL/PLATELET
Abs Immature Granulocytes: 0.11 10*3/uL — ABNORMAL HIGH (ref 0.00–0.07)
Basophils Absolute: 0.1 10*3/uL (ref 0.0–0.1)
Basophils Relative: 1 %
Eosinophils Absolute: 0.5 10*3/uL (ref 0.0–0.5)
Eosinophils Relative: 6 %
HCT: 38.2 % — ABNORMAL LOW (ref 39.0–52.0)
Hemoglobin: 12.2 g/dL — ABNORMAL LOW (ref 13.0–17.0)
Immature Granulocytes: 1 %
Lymphocytes Relative: 28 %
Lymphs Abs: 2.5 10*3/uL (ref 0.7–4.0)
MCH: 30.3 pg (ref 26.0–34.0)
MCHC: 31.9 g/dL (ref 30.0–36.0)
MCV: 94.8 fL (ref 80.0–100.0)
Monocytes Absolute: 0.5 10*3/uL (ref 0.1–1.0)
Monocytes Relative: 5 %
Neutro Abs: 5.2 10*3/uL (ref 1.7–7.7)
Neutrophils Relative %: 59 %
Platelets: 188 10*3/uL (ref 150–400)
RBC: 4.03 MIL/uL — ABNORMAL LOW (ref 4.22–5.81)
RDW: 17.3 % — ABNORMAL HIGH (ref 11.5–15.5)
WBC: 8.9 10*3/uL (ref 4.0–10.5)
nRBC: 0 % (ref 0.0–0.2)

## 2019-06-28 LAB — COMPREHENSIVE METABOLIC PANEL
ALT: 22 U/L (ref 0–44)
AST: 30 U/L (ref 15–41)
Albumin: 4.5 g/dL (ref 3.5–5.0)
Alkaline Phosphatase: 101 U/L (ref 38–126)
Anion gap: 11 (ref 5–15)
BUN: 18 mg/dL (ref 8–23)
CO2: 23 mmol/L (ref 22–32)
Calcium: 10 mg/dL (ref 8.9–10.3)
Chloride: 104 mmol/L (ref 98–111)
Creatinine, Ser: 1.2 mg/dL (ref 0.61–1.24)
GFR calc Af Amer: >60 mL/min (ref >60)
GFR calc non Af Amer: 57 mL/min — ABNORMAL LOW (ref >60)
Glucose, Bld: 160 mg/dL — ABNORMAL HIGH (ref 70–99)
Potassium: 4.7 mmol/L (ref 3.5–5.1)
Sodium: 138 mmol/L (ref 135–145)
Total Bilirubin: 0.5 mg/dL (ref 0.3–1.2)
Total Protein: 7.6 g/dL (ref 6.5–8.1)

## 2019-06-28 MED ORDER — FLUOROURACIL CHEMO INJECTION 2.5 GM/50ML
400.0000 mg/m2 | Freq: Once | INTRAVENOUS | Status: AC
  Administered 2019-06-28: 800 mg via INTRAVENOUS
  Filled 2019-06-28: qty 16

## 2019-06-28 MED ORDER — DEXAMETHASONE SODIUM PHOSPHATE 10 MG/ML IJ SOLN
10.0000 mg | Freq: Once | INTRAMUSCULAR | Status: AC
  Administered 2019-06-28: 10 mg via INTRAVENOUS
  Filled 2019-06-28: qty 1

## 2019-06-28 MED ORDER — SODIUM CHLORIDE 0.9 % IV SOLN
Freq: Once | INTRAVENOUS | Status: AC
  Administered 2019-06-28: 09:00:00 via INTRAVENOUS
  Filled 2019-06-28: qty 250

## 2019-06-28 MED ORDER — SODIUM CHLORIDE 0.9 % IV SOLN
400.0000 mg | Freq: Once | INTRAVENOUS | Status: AC
  Administered 2019-06-28: 400 mg via INTRAVENOUS
  Filled 2019-06-28: qty 16

## 2019-06-28 MED ORDER — PALONOSETRON HCL INJECTION 0.25 MG/5ML
0.2500 mg | Freq: Once | INTRAVENOUS | Status: AC
  Administered 2019-06-28: 0.25 mg via INTRAVENOUS
  Filled 2019-06-28: qty 5

## 2019-06-28 MED ORDER — LEUCOVORIN CALCIUM INJECTION 100 MG
20.0000 mg/m2 | Freq: Once | INTRAMUSCULAR | Status: AC
  Administered 2019-06-28: 40 mg via INTRAVENOUS
  Filled 2019-06-28: qty 2

## 2019-06-28 MED ORDER — SODIUM CHLORIDE 0.9% FLUSH
10.0000 mL | Freq: Once | INTRAVENOUS | Status: AC
  Administered 2019-06-28: 10 mL via INTRAVENOUS
  Filled 2019-06-28: qty 10

## 2019-06-28 MED ORDER — SODIUM CHLORIDE 0.9 % IV SOLN
2400.0000 mg/m2 | INTRAVENOUS | Status: DC
  Administered 2019-06-28: 4850 mg via INTRAVENOUS
  Filled 2019-06-28: qty 97

## 2019-06-28 NOTE — Progress Notes (Signed)
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 Patient is a 78 year old male who presented with evidence of bowel obstruction on 02/06/2017. At that time he had progressive abdominal distention and no bowel movement for about one week. CT abdomen showed an apple core lesion in the descending/sigmoid colon.  2. Patient underwent Hartmann's procedure for obstructing sigmoid colon mass on 02/06/2017. Preoperative CEA was 5.0  3. 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 It is 0 out of 15 lymph nodes were positive for malignancy. Perineural and lymphovascular invasion was present. Margins were negative.pT3N0. MMR stable.  4. Given that he had high risk stage II colon cancer including he presented with obstruction and has LVI and perineural invasion- adjuvant xeloda chemotherapy was recommended for 6 months.   5.Patient had evidence of hand foot syndrome after cycle 3. Cycle 4 delayed by 1 week. Topical urea cream prescribed  6. Patient completed 8 cycles of adjuvant xeloda in October 2018. polps seen in sigmoid and decending colon but negative for malignancy  7. Repeat Ct abdomen after 8 cycles showed no metastatic disease. 6 mm lung nodule noted in RLL  8.Patient had a repeat colonoscopy in October 2018 which showed polyps in his transverse and descending colon which were taken out and were negative for  high-grade dysplasia or malignancy. Patient subsequently underwent colostomy takedown procedure by Dr. Burt Knack  9. Repeat CT chest abdomen/ pelvis showed increase in the size of previously seen lung nodules largest being 1.4 cm consistent with metastatic disease. PET/CT showed mild uptake in 2 lung nodules. Others were not hypermetabolic. No other evidence of metastatic disease  10. Repeat lung biopsy consistent with colon adenocarcinoma. Comprehensive RAS panel testing showed mutant KRAS  11.Given the slow rate of growth of his colon cancer and desire to maintain his quality of life plan was to start chemotherapy after he gets a 19-monthbreak in summer. Repeat CT chest abdomen and pelvis in August 2019 showed mild increase in the size of his lung nodules. No new lung nodules are no new sites of distant metastatic disease.FOLFOX Avastin chemotherapy started on 06/22/2018.Patient completed 12 cycles of FOLFOX Avastin chemotherapy with continued response to his lung lesions. Oxaliplatin was dropped and patient is currently on 5-FU Avastin  Interval history-peripheral neuropathy has improved after starting gabapentin.  He denies other complaints at this time  ECOG PS- 1 Pain scale- 0   Review of systems- Review of Systems  Constitutional: Negative for chills, fever, malaise/fatigue and weight loss.  HENT: Negative for congestion, ear discharge and nosebleeds.   Eyes: Negative for blurred vision.  Respiratory: Negative for cough, hemoptysis, sputum production, shortness of breath and wheezing.   Cardiovascular: Negative for chest pain, palpitations, orthopnea and claudication.  Gastrointestinal: Negative for abdominal pain, blood in stool, constipation, diarrhea, heartburn, melena, nausea and vomiting.  Genitourinary: Negative for dysuria, flank pain, frequency, hematuria and urgency.  Musculoskeletal: Negative for back pain, joint pain and myalgias.  Skin: Negative for rash.   Neurological: Positive for sensory  change (peripheral neuropathy). Negative for dizziness, tingling, focal weakness, seizures, weakness and headaches.  Endo/Heme/Allergies: Does not bruise/bleed easily.  Psychiatric/Behavioral: Negative for depression and suicidal ideas. The patient does not have insomnia.       No Known Allergies   Past Medical History:  Diagnosis Date  . Cancer ([place])    skin cancer-nose w graft,also shoulder- basal cell per pt  . Cataract    r eye  . Chronic kidney disease    kidney stones 20 y ago per pt  . Colon cancer ([place])   . Diabetes mellitus without complication ([place])   . Eczema   . History of kidney stones   . Hyperlipidemia   . Hypertension   . Vertigo      Past Surgical History:  Procedure Laterality Date  . CATARACT EXTRACTION Left   . COLECTOMY WITH COLOSTOMY CREATION/HARTMANN PROCEDURE N/A 02/06/2017   Procedure: COLECTOMY WITH COLOSTOMY CREATION/HARTMANN PROCEDURE;  Surgeon: [name], MD;  Location: [facility] ORS;  Service: General;  Laterality: N/A;  . COLON SURGERY    . COLONOSCOPY WITH PROPOFOL N/A 09/04/2017   Procedure: COLONOSCOPY WITH PROPOFOL;  Surgeon: Jonathon Bellows, MD;  Location: [facility] ENDOSCOPY;  Service: Gastroenterology;  Laterality: N/A;  . COLOSTOMY CLOSURE N/A 10/20/2017   Procedure: COLOSTOMY CLOSURE;  Surgeon: [name], MD;  Location: [facility] ORS;  Service: General;  Laterality: N/A;  . CYSTOSCOPY    . FLEXIBLE SIGMOIDOSCOPY N/A 02/06/2017   Procedure: FLEXIBLE SIGMOIDOSCOPY;  Surgeon: Christene Lye, MD;  Location: [facility] ENDOSCOPY;  Service: Endoscopy;  Laterality: N/A;  . PORTACATH PLACEMENT N/A 06/17/2018   Procedure: INSERTION PORT-A-CATH, WITH FLUOROSCOPY;  Surgeon: [name], MD;  Location: [facility] ORS;  Service: General;  Laterality: N/A;    Social History   Socioeconomic History  . Marital status: Married    Spouse name: Not on file  . Number of children: Not on file  . Years of education: Not  on file  . Highest education level: Not on file  Occupational History  . Not on file  Social Needs  . Financial resource strain: Not on file  . Food insecurity    Worry: Not on file    Inability: Not on file  . Transportation needs    Medical: Not on file    Non-medical: Not on file  Tobacco Use  . Smoking status: Never Smoker  . Smokeless tobacco: Never Used  Substance and Sexual Activity  . Alcohol use: No  . Drug use: No  . Sexual activity: Not Currently  Lifestyle  . Physical activity    Days per week: Not on file    Minutes per session: Not on file  . Stress: Not on file  Relationships  . Social Herbalist on phone: Not on file    Gets together: Not on file    Attends religious service: Not on file    Active member of club or organization: Not on file    Attends meetings of clubs or organizations: Not on file    Relationship status: Not on file  . Intimate partner violence    Fear of current or ex partner: Not on file    Emotionally abused: Not on file    Physically abused: Not on file    Forced sexual activity: Not on file  Other Topics Concern  . Not on file  Social History Narrative  . Not on file    Family History  Problem Relation Age of  Onset  . Diabetes Mother   . AAA (abdominal aortic aneurysm) Mother   . Coronary artery disease Mother   . Diabetes Father   . Coronary artery disease Father   . Dementia Father   . Breast cancer Sister      Current Outpatient Medications:  .  acetaminophen (TYLENOL) 500 MG tablet, Take 500 mg by mouth every 6 (six) hours as needed (for pain.)., Disp: , Rfl:  .  aspirin EC 81 MG tablet, Take 81 mg by mouth daily., Disp: , Rfl:  .  Docusate Sodium (COLACE PO), Take 1 Dose by mouth as needed., Disp: , Rfl:  .  Dulaglutide 0.75 MG/0.5ML SOPN, Inject into the skin once a week. , Disp: , Rfl:  .  gabapentin (NEURONTIN) 300 MG capsule, Take 1 capsule (300 mg total) by mouth 3 (three) times daily., Disp: 90  capsule, Rfl: 3 .  glimepiride (AMARYL) 2 MG tablet, Take 2 mg by mouth 2 (two) times daily before a meal. , Disp: , Rfl:  .  lidocaine-prilocaine (EMLA) cream, Place a small amount over cream over port 1 hour before each treatment, cover cream with saran wrap for clothing protection, Disp: 30 g, Rfl: 1 .  loratadine (CLARITIN) 10 MG tablet, Take 10 mg by mouth daily., Disp: , Rfl:  .  losartan (COZAAR) 25 MG tablet, Take 25 mg by mouth daily., Disp: , Rfl:  .  metFORMIN (GLUCOPHAGE) 1000 MG tablet, Take 1,000-1,500 tablets by mouth See admin instructions. TAKE 1 TABLET (1000 MG) BY MOUTH IN THE MORNING & TAKE 1.5 TABLETS (1500 MG) BY MOUTH AT SUPPER, Disp: , Rfl:  .  ondansetron (ZOFRAN) 8 MG tablet, Take 1 tablet (8 mg total) by mouth 2 (two) times daily as needed for refractory nausea / vomiting. Start on day 3 after chemotherapy., Disp: 30 tablet, Rfl: 1 .  pravastatin (PRAVACHOL) 20 MG tablet, Take 20 mg by mouth daily with supper. , Disp: , Rfl:  .  prochlorperazine (COMPAZINE) 10 MG tablet, Take 1 tablet (10 mg total) by mouth every 6 (six) hours as needed (Nausea or vomiting)., Disp: 30 tablet, Rfl: 1 .  triamcinolone (KENALOG) 0.025 % cream, Apply 1 application topically daily as needed (FOR EZCEMA)., Disp: 30 g, Rfl: 2 No current facility-administered medications for this visit.   Facility-Administered Medications Ordered in Other Visits:  .  0.9 %  sodium chloride infusion, , Intravenous, Continuous, Sindy Guadeloupe, MD, Stopped at 07/27/18 1037 .  heparin lock flush 100 unit/mL, 500 Units, Intravenous, Once, Sindy Guadeloupe, MD .  sodium chloride flush (NS) 0.9 % injection 10 mL, 10 mL, Intravenous, PRN, Sindy Guadeloupe, MD, 10 mL at 07/27/18 0920  Physical exam:  Vitals:   06/28/19 0850  BP: 133/78  Pulse: 73  Resp: 18  Temp: (!) 96.6 F (35.9 C)  TempSrc: Tympanic  Weight: 182 lb 3.2 oz (82.6 kg)   Physical Exam HENT:     Head: Normocephalic and atraumatic.  Eyes:      Pupils: Pupils are equal, round, and reactive to light.  Neck:     Musculoskeletal: Normal range of motion.  Cardiovascular:     Rate and Rhythm: Normal rate and regular rhythm.     Heart sounds: Normal heart sounds.  Pulmonary:     Effort: Pulmonary effort is normal.     Breath sounds: Normal breath sounds.  Abdominal:     General: Bowel sounds are normal.     Palpations: Abdomen is soft.  Skin:    General: Skin is warm and dry.  Neurological:     Mental Status: He is alert and oriented to person, place, and time.      CMP Latest Ref Rng & Units 06/14/2019  Glucose 70 - 99 mg/dL 220(H)  BUN 8 - 23 mg/dL 21  Creatinine 0.61 - 1.24 mg/dL 1.55(H)  Sodium 135 - 145 mmol/L 138  Potassium 3.5 - 5.1 mmol/L 4.6  Chloride 98 - 111 mmol/L 106  CO2 22 - 32 mmol/L 22  Calcium 8.9 - 10.3 mg/dL 9.7  Total Protein 6.5 - 8.1 g/dL 7.4  Total Bilirubin 0.3 - 1.2 mg/dL 0.7  Alkaline Phos 38 - 126 U/L 102  AST 15 - 41 U/L 23  ALT 0 - 44 U/L 17   CBC Latest Ref Rng & Units 06/14/2019  WBC 4.0 - 10.5 K/uL 11.5(H)  Hemoglobin 13.0 - 17.0 g/dL 12.2(L)  Hematocrit 39.0 - 52.0 % 38.4(L)  Platelets 150 - 400 K/uL 188      Assessment and plan- Patient is a 78 y.o. 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 Here for on treatment assessment prior to cycle 21 of 5-FU Avastin chemotherapy  Counts okay to proceed with cycle 21 of 5-FU Mvasi chemotherapy today.  He will come back on day 3 for pump disconnect and receive Udenyca on that day.  He will be due for repeat scans after cycle 23.  Serum creatinine improved after he has increased his oral intake.  Okay to give Mvasi today.  I will see him back in 2 weeks time with CBC with differential, CMP and urine protein for cycle 22.  Chemo-induced peripheral neuropathy: Patient would like to take gabapentin 900 mg twice daily and see if it helps his neuropathy further.   Visit Diagnosis 1. Malignant neoplasm of sigmoid colon ([place])    2. Malignant neoplasm metastatic to lung, unspecified laterality ([place])   3. Encounter for antineoplastic chemotherapy      Dr. Randa Evens, MD, MPH [facility] [phone] 06/28/2019 12:43 PM

## 2019-06-28 NOTE — Progress Notes (Signed)
Pt in for follow up reports went to the beach over the weekend. Denies any concerns or difficulties.

## 2019-06-29 ENCOUNTER — Other Ambulatory Visit: Payer: Self-pay | Admitting: *Deleted

## 2019-06-29 ENCOUNTER — Telehealth: Payer: Self-pay | Admitting: *Deleted

## 2019-06-29 MED ORDER — GABAPENTIN 300 MG PO CAPS: 900.0000 mg | ORAL_CAPSULE | Freq: Two times a day (BID) | ORAL | 2 refills | Status: DC

## 2019-06-29 NOTE — Telephone Encounter (Signed)
Dr. [name] told pt that she is increasing his gabapentin from 600 mg bid to 900 mg bid and he needs refill with the changes. I told him that I will check with [name] and then make new rx with the instructions to pharmacy to let them know we are increasing dose. Dr. [name] note from yest. Michela Pitcher to inc. And I have sent a rx to pharmacy

## 2019-06-30 ENCOUNTER — Other Ambulatory Visit: Payer: Self-pay

## 2019-06-30 ENCOUNTER — Inpatient Hospital Stay: Payer: Medicare Other

## 2019-06-30 VITALS — BP 138/56 | HR 68 | Resp 19

## 2019-06-30 DIAGNOSIS — C7800 Secondary malignant neoplasm of unspecified lung: Secondary | ICD-10-CM

## 2019-06-30 DIAGNOSIS — Z5111 Encounter for antineoplastic chemotherapy: Secondary | ICD-10-CM | POA: Diagnosis not present

## 2019-06-30 DIAGNOSIS — C189 Malignant neoplasm of colon, unspecified: Secondary | ICD-10-CM

## 2019-06-30 MED ORDER — SODIUM CHLORIDE 0.9% FLUSH
10.0000 mL | INTRAVENOUS | Status: DC | PRN
  Administered 2019-06-30: 11:00:00 10 mL
  Filled 2019-06-30: qty 10

## 2019-06-30 MED ORDER — HEPARIN SOD (PORK) LOCK FLUSH 100 UNIT/ML IV SOLN
500.0000 [IU] | Freq: Once | INTRAVENOUS | Status: AC | PRN
  Administered 2019-06-30: 500 [IU]
  Filled 2019-06-30: qty 5

## 2019-06-30 MED ORDER — PEGFILGRASTIM-CBQV 6 MG/0.6ML ~~LOC~~ SOSY
6.0000 mg | PREFILLED_SYRINGE | Freq: Once | SUBCUTANEOUS | Status: AC
  Administered 2019-06-30: 6 mg via SUBCUTANEOUS
  Filled 2019-06-30: qty 0.6

## 2019-07-12 ENCOUNTER — Ambulatory Visit: Payer: Medicare Other | Admitting: Oncology

## 2019-07-12 ENCOUNTER — Other Ambulatory Visit: Payer: Medicare Other

## 2019-07-12 ENCOUNTER — Ambulatory Visit: Payer: Medicare Other

## 2019-07-15 ENCOUNTER — Other Ambulatory Visit: Payer: Self-pay

## 2019-07-19 ENCOUNTER — Encounter: Payer: Self-pay | Admitting: Oncology

## 2019-07-19 ENCOUNTER — Other Ambulatory Visit: Payer: Self-pay

## 2019-07-19 ENCOUNTER — Inpatient Hospital Stay: Payer: Medicare Other

## 2019-07-19 ENCOUNTER — Inpatient Hospital Stay: Payer: Medicare Other | Attending: Oncology

## 2019-07-19 ENCOUNTER — Inpatient Hospital Stay (HOSPITAL_BASED_OUTPATIENT_CLINIC_OR_DEPARTMENT_OTHER): Payer: Medicare Other | Admitting: Oncology

## 2019-07-19 VITALS — BP 145/62 | HR 70 | Temp 96.7°F | Resp 16 | Ht 66.0 in | Wt 186.8 lb

## 2019-07-19 DIAGNOSIS — Z5111 Encounter for antineoplastic chemotherapy: Secondary | ICD-10-CM

## 2019-07-19 DIAGNOSIS — Z5189 Encounter for other specified aftercare: Secondary | ICD-10-CM | POA: Diagnosis not present

## 2019-07-19 DIAGNOSIS — C7800 Secondary malignant neoplasm of unspecified lung: Secondary | ICD-10-CM

## 2019-07-19 DIAGNOSIS — C187 Malignant neoplasm of sigmoid colon: Secondary | ICD-10-CM | POA: Insufficient documentation

## 2019-07-19 DIAGNOSIS — T451X5A Adverse effect of antineoplastic and immunosuppressive drugs, initial encounter: Secondary | ICD-10-CM | POA: Diagnosis not present

## 2019-07-19 DIAGNOSIS — Z5112 Encounter for antineoplastic immunotherapy: Secondary | ICD-10-CM | POA: Diagnosis present

## 2019-07-19 DIAGNOSIS — G620 Drug-induced polyneuropathy: Secondary | ICD-10-CM

## 2019-07-19 DIAGNOSIS — C189 Malignant neoplasm of colon, unspecified: Secondary | ICD-10-CM

## 2019-07-19 LAB — COMPREHENSIVE METABOLIC PANEL
ALT: 18 U/L (ref 0–44)
AST: 23 U/L (ref 15–41)
Albumin: 4.2 g/dL (ref 3.5–5.0)
Alkaline Phosphatase: 74 U/L (ref 38–126)
Anion gap: 9 (ref 5–15)
BUN: 20 mg/dL (ref 8–23)
CO2: 23 mmol/L (ref 22–32)
Calcium: 9.1 mg/dL (ref 8.9–10.3)
Chloride: 106 mmol/L (ref 98–111)
Creatinine, Ser: 1.24 mg/dL (ref 0.61–1.24)
GFR calc Af Amer: >60 mL/min (ref >60)
GFR calc non Af Amer: 55 mL/min — ABNORMAL LOW (ref >60)
Glucose, Bld: 196 mg/dL — ABNORMAL HIGH (ref 70–99)
Potassium: 4.2 mmol/L (ref 3.5–5.1)
Sodium: 138 mmol/L (ref 135–145)
Total Bilirubin: 0.5 mg/dL (ref 0.3–1.2)
Total Protein: 7 g/dL (ref 6.5–8.1)

## 2019-07-19 LAB — CBC WITH DIFFERENTIAL/PLATELET
Abs Immature Granulocytes: 0.02 10*3/uL (ref 0.00–0.07)
Basophils Absolute: 0.1 10*3/uL (ref 0.0–0.1)
Basophils Relative: 1 %
Eosinophils Absolute: 0.3 10*3/uL (ref 0.0–0.5)
Eosinophils Relative: 4 %
HCT: 36.9 % — ABNORMAL LOW (ref 39.0–52.0)
Hemoglobin: 11.7 g/dL — ABNORMAL LOW (ref 13.0–17.0)
Immature Granulocytes: 0 %
Lymphocytes Relative: 28 %
Lymphs Abs: 1.9 10*3/uL (ref 0.7–4.0)
MCH: 30.5 pg (ref 26.0–34.0)
MCHC: 31.7 g/dL (ref 30.0–36.0)
MCV: 96.1 fL (ref 80.0–100.0)
Monocytes Absolute: 0.6 10*3/uL (ref 0.1–1.0)
Monocytes Relative: 8 %
Neutro Abs: 4 10*3/uL (ref 1.7–7.7)
Neutrophils Relative %: 59 %
Platelets: 184 10*3/uL (ref 150–400)
RBC: 3.84 MIL/uL — ABNORMAL LOW (ref 4.22–5.81)
RDW: 16.9 % — ABNORMAL HIGH (ref 11.5–15.5)
WBC: 6.8 10*3/uL (ref 4.0–10.5)
nRBC: 0 % (ref 0.0–0.2)

## 2019-07-19 LAB — PROTEIN, URINE, RANDOM: Total Protein, Urine: 20 mg/dL

## 2019-07-19 MED ORDER — LEUCOVORIN CALCIUM INJECTION 100 MG
20.0000 mg/m2 | Freq: Once | INTRAMUSCULAR | Status: AC
  Administered 2019-07-19: 40 mg via INTRAVENOUS
  Filled 2019-07-19: qty 2

## 2019-07-19 MED ORDER — SODIUM CHLORIDE 0.9 % IV SOLN
400.0000 mg | Freq: Once | INTRAVENOUS | Status: AC
  Administered 2019-07-19: 10:00:00 400 mg via INTRAVENOUS
  Filled 2019-07-19: qty 16

## 2019-07-19 MED ORDER — PALONOSETRON HCL INJECTION 0.25 MG/5ML
0.2500 mg | Freq: Once | INTRAVENOUS | Status: AC
  Administered 2019-07-19: 0.25 mg via INTRAVENOUS
  Filled 2019-07-19: qty 5

## 2019-07-19 MED ORDER — DEXAMETHASONE SODIUM PHOSPHATE 10 MG/ML IJ SOLN
10.0000 mg | Freq: Once | INTRAMUSCULAR | Status: AC
  Administered 2019-07-19: 10:00:00 10 mg via INTRAVENOUS
  Filled 2019-07-19: qty 1

## 2019-07-19 MED ORDER — SODIUM CHLORIDE 0.9 % IV SOLN
2400.0000 mg/m2 | INTRAVENOUS | Status: DC
  Administered 2019-07-19: 4850 mg via INTRAVENOUS
  Filled 2019-07-19: qty 97

## 2019-07-19 MED ORDER — SODIUM CHLORIDE 0.9% FLUSH
10.0000 mL | Freq: Once | INTRAVENOUS | Status: AC
  Administered 2019-07-19: 08:00:00 10 mL via INTRAVENOUS
  Filled 2019-07-19: qty 10

## 2019-07-19 MED ORDER — SODIUM CHLORIDE 0.9 % IV SOLN
Freq: Once | INTRAVENOUS | Status: AC
  Administered 2019-07-19: 09:00:00 via INTRAVENOUS
  Filled 2019-07-19: qty 250

## 2019-07-19 MED ORDER — FLUOROURACIL CHEMO INJECTION 2.5 GM/50ML
400.0000 mg/m2 | Freq: Once | INTRAVENOUS | Status: AC
  Administered 2019-07-19: 800 mg via INTRAVENOUS
  Filled 2019-07-19: qty 16

## 2019-07-19 NOTE — Progress Notes (Signed)
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 Patient is a 78 year old male who presented with evidence of bowel obstruction on 02/06/2017. At that time he had progressive abdominal distention and no bowel movement for about one week. CT abdomen showed an apple core lesion in the descending/sigmoid colon.  2. Patient underwent Hartmann's procedure for obstructing sigmoid colon mass on 02/06/2017. Preoperative CEA was 5.0  3. 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 It is 0 out of 15 lymph nodes were positive for malignancy. Perineural and lymphovascular invasion was present. Margins were negative.pT3N0. MMR stable.  4. Given that he had high risk stage II colon cancer including he presented with obstruction and has LVI and perineural invasion- adjuvant xeloda chemotherapy was recommended for 6 months.   5.Patient had evidence of hand foot syndrome after cycle 3. Cycle 4 delayed by 1 week. Topical urea cream prescribed  6. Patient completed 8 cycles of adjuvant xeloda in October 2018. polps seen in sigmoid and decending colon but negative for malignancy  7. Repeat Ct abdomen after 8 cycles showed no metastatic disease. 6 mm lung nodule noted in RLL  8.Patient had a repeat colonoscopy in October 2018 which showed polyps in his transverse and descending colon which were taken out and were negative for  high-grade dysplasia or malignancy. Patient subsequently underwent colostomy takedown procedure by Dr. Burt Knack  9. Repeat CT chest abdomen/ pelvis showed increase in the size of previously seen lung nodules largest being 1.4 cm consistent with metastatic disease. PET/CT showed mild uptake in 2 lung nodules. Others were not hypermetabolic. No other evidence of metastatic disease  10. Repeat lung biopsy consistent with colon adenocarcinoma. Comprehensive RAS panel testing showed mutant KRAS  11.Given the slow rate of growth of his colon cancer and desire to maintain his quality of life plan was to start chemotherapy after he gets a 39-monthbreak in summer. Repeat CT chest abdomen and pelvis in August 2019 showed mild increase in the size of his lung nodules. No new lung nodules are no new sites of distant metastatic disease.FOLFOX Avastin chemotherapy started on 06/22/2018.Patient completed 12 cycles of FOLFOX Avastin chemotherapy with continued response to his lung lesions. Oxaliplatin was dropped and patient is currently on 5-FU Avastin  Interval history-overall he is doing well and reports her neuropathy in his hands and feet is improving.  He reports having tooth pain and will be seeing his dentist soon.  ECOG PS- 1 Pain scale- 0   Review of systems- Review of Systems  Constitutional: Negative for chills, fever, malaise/fatigue and weight loss.  HENT: Negative for congestion, ear discharge and nosebleeds.   Eyes: Negative for blurred vision.  Respiratory: Negative for cough, hemoptysis, sputum production, shortness of breath and wheezing.   Cardiovascular: Negative for chest pain, palpitations, orthopnea and claudication.  Gastrointestinal: Negative for abdominal pain, blood in stool, constipation, diarrhea, heartburn, melena, nausea and vomiting.  Genitourinary: Negative for dysuria, flank pain, frequency, hematuria and urgency.  Musculoskeletal: Negative for  back pain, joint pain  and myalgias.  Skin: Negative for rash.  Neurological: Positive for sensory change (Peripheral neuropathy). Negative for dizziness, tingling, focal weakness, seizures, weakness and headaches.  Endo/Heme/Allergies: Does not bruise/bleed easily.  Psychiatric/Behavioral: Negative for depression and suicidal ideas. The patient does not have insomnia.        No Known Allergies   Past Medical History:  Diagnosis Date  . Cancer ([place])    skin cancer-nose w graft,also shoulder- basal cell per pt  . Cataract    r eye  . Chronic kidney disease    kidney stones 20 y ago per pt  . Colon cancer ([place])   . Diabetes mellitus without complication ([place])   . Eczema   . History of kidney stones   . Hyperlipidemia   . Hypertension   . Vertigo      Past Surgical History:  Procedure Laterality Date  . CATARACT EXTRACTION Left   . COLECTOMY WITH COLOSTOMY CREATION/HARTMANN PROCEDURE N/A 02/06/2017   Procedure: COLECTOMY WITH COLOSTOMY CREATION/HARTMANN PROCEDURE;  Surgeon: [name], MD;  Location: [facility] ORS;  Service: General;  Laterality: N/A;  . COLON SURGERY    . COLONOSCOPY WITH PROPOFOL N/A 09/04/2017   Procedure: COLONOSCOPY WITH PROPOFOL;  Surgeon: Jonathon Bellows, MD;  Location[facility] ENDOSCOPY;  Service: Gastroenterology;  Laterality: N/A;  . COLOSTOMY CLOSURE N/A 10/20/2017   Procedure: COLOSTOMY CLOSURE;  Surgeon: [name], MD;  Location: [facility] ORS;  Service: General;  Laterality: N/A;  . CYSTOSCOPY    . FLEXIBLE SIGMOIDOSCOPY N/A 02/06/2017   Procedure: FLEXIBLE SIGMOIDOSCOPY;  Surgeon: Christene Lye, MD;  Location: [facility] ENDOSCOPY;  Service: Endoscopy;  Laterality: N/A;  . PORTACATH PLACEMENT N/A 06/17/2018   Procedure: INSERTION PORT-A-CATH, WITH FLUOROSCOPY;  Surgeon: [name], MD;  Location: [facility] ORS;  Service: General;  Laterality: N/A;    Social History   Socioeconomic History  . Marital status: Married    Spouse name: Not on file  . Number of  children: Not on file  . Years of education: Not on file  . Highest education level: Not on file  Occupational History  . Not on file  Social Needs  . Financial resource strain: Not on file  . Food insecurity    Worry: Not on file    Inability: Not on file  . Transportation needs    Medical: Not on file    Non-medical: Not on file  Tobacco Use  . Smoking status: Never Smoker  . Smokeless tobacco: Never Used  Substance and Sexual Activity  . Alcohol use: No  . Drug use: No  . Sexual activity: Not Currently  Lifestyle  . Physical activity    Days per week: Not on file    Minutes per session: Not on file  . Stress: Not on file  Relationships  . Social Herbalist on phone: Not on file    Gets together: Not on file    Attends religious service: Not on file    Active member of club or organization: Not on file    Attends meetings of clubs or organizations: Not on file    Relationship status: Not on file  . Intimate partner violence    Fear of current or ex partner: Not on file    Emotionally abused: Not on file    Physically abused: Not on file    Forced sexual activity: Not on file  Other Topics Concern  . Not on file  Social History Narrative  . Not on file    Family History  Problem Relation Age of Onset  . Diabetes Mother   . AAA (abdominal aortic aneurysm) Mother   . Coronary artery disease Mother   . Diabetes Father   . Coronary artery disease Father   . Dementia Father   . Breast cancer Sister      Current Outpatient Medications:  .  acetaminophen (TYLENOL) 500 MG tablet, Take 500 mg by mouth every 6 (six) hours as needed (for pain.)., Disp: , Rfl:  .  aspirin EC 81 MG tablet, Take 81 mg by mouth daily., Disp: , Rfl:  .  Docusate Sodium (COLACE PO), Take 1 Dose by mouth as needed., Disp: , Rfl:  .  Dulaglutide 0.75 MG/0.5ML SOPN, Inject into the skin once a week. , Disp: , Rfl:  .  gabapentin (NEURONTIN) 300 MG capsule, Take 3 capsules (900 mg  total) by mouth 2 (two) times daily., Disp: 180 capsule, Rfl: 2 .  glimepiride (AMARYL) 2 MG tablet, Take 2 mg by mouth 2 (two) times daily before a meal. , Disp: , Rfl:  .  lidocaine-prilocaine (EMLA) cream, Place a small amount over cream over port 1 hour before each treatment, cover cream with saran wrap for clothing protection, Disp: 30 g, Rfl: 1 .  loratadine (CLARITIN) 10 MG tablet, Take 10 mg by mouth daily., Disp: , Rfl:  .  losartan (COZAAR) 25 MG tablet, Take 25 mg by mouth daily., Disp: , Rfl:  .  metFORMIN (GLUCOPHAGE) 1000 MG tablet, Take 1,000-1,500 tablets by mouth See admin instructions. TAKE 1 TABLET (1000 MG) BY MOUTH IN THE MORNING & TAKE 1.5 TABLETS (1500 MG) BY MOUTH AT SUPPER, Disp: , Rfl:  .  ondansetron (ZOFRAN) 8 MG tablet, Take 1 tablet (8 mg total) by mouth 2 (two) times daily as needed for refractory nausea / vomiting. Start on day 3 after chemotherapy. (Patient not taking: Reported on 07/15/2019), Disp: 30 tablet, Rfl: 1 .  pravastatin (PRAVACHOL) 20 MG tablet, Take 20 mg by mouth daily with supper. , Disp: , Rfl:  .  prochlorperazine (COMPAZINE) 10 MG tablet, Take 1 tablet (10 mg total) by mouth every 6 (six) hours as needed (Nausea or vomiting). (Patient not taking: Reported on 07/15/2019), Disp: 30 tablet, Rfl: 1 .  triamcinolone (KENALOG) 0.025 % cream, Apply 1 application topically daily as needed (FOR EZCEMA)., Disp: 30 g, Rfl: 2 No current facility-administered medications for this visit.   Facility-Administered Medications Ordered in Other Visits:  .  0.9 %  sodium chloride infusion, , Intravenous, Continuous, Sindy Guadeloupe, MD, Stopped at 07/27/18 1037 .  heparin lock flush 100 unit/mL, 500 Units, Intravenous, Once, Sindy Guadeloupe, MD .  sodium chloride flush (NS) 0.9 % injection 10 mL, 10 mL, Intravenous, PRN, Sindy Guadeloupe, MD, 10 mL at 07/27/18 0920  Physical exam:  Vitals:   07/19/19 0826  BP: (!) 145/62  Pulse: 70  Resp: 16  Temp: (!) 96.7 F (35.9 C)   TempSrc: Tympanic  Weight: 186 lb 12.8 oz (84.7 kg)  Height: '5\' 6"'  (1.676 m)   Physical Exam Constitutional:      General: He is not in acute distress. HENT:     Head: Normocephalic and atraumatic.  Eyes:     Pupils: Pupils are equal, round, and reactive to light.  Neck:     Musculoskeletal: Normal range of motion.  Cardiovascular:     Rate and Rhythm: Normal rate and  regular rhythm.     Heart sounds: Normal heart sounds.  Pulmonary:     Effort: Pulmonary effort is normal.     Breath sounds: Normal breath sounds.  Abdominal:     General: Bowel sounds are normal.     Palpations: Abdomen is soft.  Skin:    General: Skin is warm and dry.  Neurological:     Mental Status: He is alert and oriented to person, place, and time.      CMP Latest Ref Rng & Units 06/28/2019  Glucose 70 - 99 mg/dL 160(H)  BUN 8 - 23 mg/dL 18  Creatinine 0.61 - 1.24 mg/dL 1.20  Sodium 135 - 145 mmol/L 138  Potassium 3.5 - 5.1 mmol/L 4.7  Chloride 98 - 111 mmol/L 104  CO2 22 - 32 mmol/L 23  Calcium 8.9 - 10.3 mg/dL 10.0  Total Protein 6.5 - 8.1 g/dL 7.6  Total Bilirubin 0.3 - 1.2 mg/dL 0.5  Alkaline Phos 38 - 126 U/L 101  AST 15 - 41 U/L 30  ALT 0 - 44 U/L 22   CBC Latest Ref Rng & Units 06/28/2019  WBC 4.0 - 10.5 K/uL 8.9  Hemoglobin 13.0 - 17.0 g/dL 12.2(L)  Hematocrit 39.0 - 52.0 % 38.2(L)  Platelets 150 - 400 K/uL 188     Assessment and plan- Patient is a 78 y.o. male withstage IV colon adenocarcinoma with lung metastases. He is here for an treatment assessment prior to cycle 22 of 5-FU Avastin chemotherapy  Counts okay to proceed with cycle 22 of 5-FU and Avastin chemotherapy today.  He will received Udenyca on day 3 of pump disconnect.  I will see him back in 2 weeks time with CBC with differential, CMP and urine protein for cycle 23.  Scans after cycle 23  Dental procedure: Patient plans to see his dentist soon and anticipates no more than dental filling which will be okay to proceed  with Mvasi.  However if any invasive procedures such as dental extraction or root canal is attempted then he would need to hold off on getting his Avastin 4 weeks prior and 4 weeks after the procedure.  Mvasi does carry a risk of osteonecrosis of the jaw as well as bleeding complications.  Patient verbalized understanding  Chemo-induced peripheral neuropathy: Continue gabapentin   Visit Diagnosis 1. Malignant neoplasm metastatic to lung, unspecified laterality ([place])   2. Encounter for antineoplastic chemotherapy   3. Chemotherapy-induced peripheral neuropathy ([place])      Dr. Randa Evens, MD, MPH [facility] [phone] 07/19/2019 8:49 AM

## 2019-07-19 NOTE — Progress Notes (Signed)
Pt here for treatment. States he has tooth that has been hurting him and he needs to see dentist to see what the problem is. He also states that neuropathy is better

## 2019-07-20 ENCOUNTER — Other Ambulatory Visit: Payer: Self-pay | Admitting: *Deleted

## 2019-07-20 MED ORDER — LIDOCAINE-PRILOCAINE 2.5-2.5 % EX CREA: TOPICAL_CREAM | CUTANEOUS | 1 refills | Status: DC

## 2019-07-21 ENCOUNTER — Other Ambulatory Visit: Payer: Self-pay

## 2019-07-21 ENCOUNTER — Inpatient Hospital Stay: Payer: Medicare Other

## 2019-07-21 VITALS — BP 143/77 | HR 70 | Temp 97.2°F | Resp 18

## 2019-07-21 DIAGNOSIS — C189 Malignant neoplasm of colon, unspecified: Secondary | ICD-10-CM

## 2019-07-21 DIAGNOSIS — C7800 Secondary malignant neoplasm of unspecified lung: Secondary | ICD-10-CM

## 2019-07-21 DIAGNOSIS — Z5112 Encounter for antineoplastic immunotherapy: Secondary | ICD-10-CM | POA: Diagnosis not present

## 2019-07-21 MED ORDER — HEPARIN SOD (PORK) LOCK FLUSH 100 UNIT/ML IV SOLN
500.0000 [IU] | Freq: Once | INTRAVENOUS | Status: AC | PRN
  Administered 2019-07-21: 500 [IU]
  Filled 2019-07-21: qty 5

## 2019-07-21 MED ORDER — PEGFILGRASTIM-CBQV 6 MG/0.6ML ~~LOC~~ SOSY
6.0000 mg | PREFILLED_SYRINGE | Freq: Once | SUBCUTANEOUS | Status: AC
  Administered 2019-07-21: 11:00:00 6 mg via SUBCUTANEOUS
  Filled 2019-07-21: qty 0.6

## 2019-08-02 ENCOUNTER — Inpatient Hospital Stay: Payer: Medicare Other

## 2019-08-02 ENCOUNTER — Inpatient Hospital Stay (HOSPITAL_BASED_OUTPATIENT_CLINIC_OR_DEPARTMENT_OTHER): Payer: Medicare Other | Admitting: Oncology

## 2019-08-02 ENCOUNTER — Encounter: Payer: Self-pay | Admitting: Oncology

## 2019-08-02 ENCOUNTER — Other Ambulatory Visit: Payer: Self-pay

## 2019-08-02 VITALS — BP 137/64 | HR 75 | Temp 97.9°F | Resp 16 | Ht 66.0 in | Wt 186.9 lb

## 2019-08-02 DIAGNOSIS — C189 Malignant neoplasm of colon, unspecified: Secondary | ICD-10-CM | POA: Diagnosis not present

## 2019-08-02 DIAGNOSIS — C7800 Secondary malignant neoplasm of unspecified lung: Secondary | ICD-10-CM | POA: Diagnosis not present

## 2019-08-02 DIAGNOSIS — N179 Acute kidney failure, unspecified: Secondary | ICD-10-CM | POA: Diagnosis not present

## 2019-08-02 DIAGNOSIS — R739 Hyperglycemia, unspecified: Secondary | ICD-10-CM

## 2019-08-02 DIAGNOSIS — Z5111 Encounter for antineoplastic chemotherapy: Secondary | ICD-10-CM | POA: Diagnosis not present

## 2019-08-02 DIAGNOSIS — Z5112 Encounter for antineoplastic immunotherapy: Secondary | ICD-10-CM | POA: Diagnosis not present

## 2019-08-02 DIAGNOSIS — Z95828 Presence of other vascular implants and grafts: Secondary | ICD-10-CM

## 2019-08-02 LAB — COMPREHENSIVE METABOLIC PANEL
ALT: 23 U/L (ref 0–44)
AST: 31 U/L (ref 15–41)
Albumin: 4 g/dL (ref 3.5–5.0)
Alkaline Phosphatase: 108 U/L (ref 38–126)
Anion gap: 11 (ref 5–15)
BUN: 16 mg/dL (ref 8–23)
CO2: 23 mmol/L (ref 22–32)
Calcium: 9.2 mg/dL (ref 8.9–10.3)
Chloride: 103 mmol/L (ref 98–111)
Creatinine, Ser: 1.33 mg/dL — ABNORMAL HIGH (ref 0.61–1.24)
GFR calc Af Amer: 59 mL/min — ABNORMAL LOW (ref >60)
GFR calc non Af Amer: 50 mL/min — ABNORMAL LOW (ref >60)
Glucose, Bld: 265 mg/dL — ABNORMAL HIGH (ref 70–99)
Potassium: 4.5 mmol/L (ref 3.5–5.1)
Sodium: 137 mmol/L (ref 135–145)
Total Bilirubin: 0.6 mg/dL (ref 0.3–1.2)
Total Protein: 7.1 g/dL (ref 6.5–8.1)

## 2019-08-02 LAB — PROTEIN, URINE, RANDOM: Total Protein, Urine: 8 mg/dL

## 2019-08-02 LAB — CBC WITH DIFFERENTIAL/PLATELET
Abs Immature Granulocytes: 0.11 10*3/uL — ABNORMAL HIGH (ref 0.00–0.07)
Basophils Absolute: 0.1 10*3/uL (ref 0.0–0.1)
Basophils Relative: 1 %
Eosinophils Absolute: 0.4 10*3/uL (ref 0.0–0.5)
Eosinophils Relative: 5 %
HCT: 38.2 % — ABNORMAL LOW (ref 39.0–52.0)
Hemoglobin: 12.2 g/dL — ABNORMAL LOW (ref 13.0–17.0)
Immature Granulocytes: 1 %
Lymphocytes Relative: 23 %
Lymphs Abs: 2.1 10*3/uL (ref 0.7–4.0)
MCH: 30.3 pg (ref 26.0–34.0)
MCHC: 31.9 g/dL (ref 30.0–36.0)
MCV: 94.8 fL (ref 80.0–100.0)
Monocytes Absolute: 0.5 10*3/uL (ref 0.1–1.0)
Monocytes Relative: 6 %
Neutro Abs: 5.9 10*3/uL (ref 1.7–7.7)
Neutrophils Relative %: 64 %
Platelets: 150 10*3/uL (ref 150–400)
RBC: 4.03 MIL/uL — ABNORMAL LOW (ref 4.22–5.81)
RDW: 16 % — ABNORMAL HIGH (ref 11.5–15.5)
WBC: 9.1 10*3/uL (ref 4.0–10.5)
nRBC: 0 % (ref 0.0–0.2)

## 2019-08-02 MED ORDER — DEXAMETHASONE SODIUM PHOSPHATE 10 MG/ML IJ SOLN
5.0000 mg | Freq: Once | INTRAMUSCULAR | Status: AC
  Administered 2019-08-02: 5 mg via INTRAVENOUS
  Filled 2019-08-02: qty 1

## 2019-08-02 MED ORDER — SODIUM CHLORIDE 0.9 % IV SOLN
Freq: Once | INTRAVENOUS | Status: AC
  Administered 2019-08-02: 09:00:00 via INTRAVENOUS
  Filled 2019-08-02: qty 250

## 2019-08-02 MED ORDER — SODIUM CHLORIDE 0.9 % IV SOLN
2400.0000 mg/m2 | INTRAVENOUS | Status: DC
  Administered 2019-08-02: 12:00:00 4850 mg via INTRAVENOUS
  Filled 2019-08-02: qty 97

## 2019-08-02 MED ORDER — FLUOROURACIL CHEMO INJECTION 2.5 GM/50ML
400.0000 mg/m2 | Freq: Once | INTRAVENOUS | Status: AC
  Administered 2019-08-02: 800 mg via INTRAVENOUS
  Filled 2019-08-02: qty 16

## 2019-08-02 MED ORDER — SODIUM CHLORIDE 0.9% FLUSH
10.0000 mL | Freq: Once | INTRAVENOUS | Status: AC
  Administered 2019-08-02: 08:00:00 10 mL via INTRAVENOUS
  Filled 2019-08-02: qty 10

## 2019-08-02 MED ORDER — PALONOSETRON HCL INJECTION 0.25 MG/5ML
0.2500 mg | Freq: Once | INTRAVENOUS | Status: AC
  Administered 2019-08-02: 10:00:00 0.25 mg via INTRAVENOUS
  Filled 2019-08-02: qty 5

## 2019-08-02 MED ORDER — DEXAMETHASONE SODIUM PHOSPHATE 10 MG/ML IJ SOLN: 10.0000 mg | Freq: Once | INTRAMUSCULAR | Status: DC

## 2019-08-02 MED ORDER — SODIUM CHLORIDE 0.9 % IV SOLN
400.0000 mg | Freq: Once | INTRAVENOUS | Status: AC
  Administered 2019-08-02: 400 mg via INTRAVENOUS
  Filled 2019-08-02: qty 16

## 2019-08-02 MED ORDER — LEUCOVORIN CALCIUM INJECTION 100 MG
20.0000 mg/m2 | Freq: Once | INTRAMUSCULAR | Status: AC
  Administered 2019-08-02: 40 mg via INTRAVENOUS
  Filled 2019-08-02: qty 2

## 2019-08-02 NOTE — Progress Notes (Signed)
Decrease dexamethasone to 5mg  due to elevated blood glucose per Dr. [name].   Demetrius Charity, PharmD, [facility] Pharmacist Pharmacy Phone: [phone] 08/02/2019

## 2019-08-02 NOTE — Progress Notes (Signed)
Pt doing good, neuropathy still the same-no changes

## 2019-08-02 NOTE — Progress Notes (Signed)
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 Patient is a 78 year old male who presented with evidence of bowel obstruction on 02/06/2017. At that time he had progressive abdominal distention and no bowel movement for about one week. CT abdomen showed an apple core lesion in the descending/sigmoid colon.  2. Patient underwent Hartmann's procedure for obstructing sigmoid colon mass on 02/06/2017. Preoperative CEA was 5.0  3. 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 It is 0 out of 15 lymph nodes were positive for malignancy. Perineural and lymphovascular invasion was present. Margins were negative.pT3N0. MMR stable.  4. Given that he had high risk stage II colon cancer including he presented with obstruction and has LVI and perineural invasion- adjuvant xeloda chemotherapy was recommended for 6 months.   5.Patient had evidence of hand foot syndrome after cycle 3. Cycle 4 delayed by 1 week. Topical urea cream prescribed  6. Patient completed 8 cycles of adjuvant xeloda in October 2018. polps seen in sigmoid and decending colon but negative for malignancy  7. Repeat Ct abdomen after 8 cycles showed no metastatic disease. 6 mm lung nodule noted in RLL  8.Patient had a repeat colonoscopy in October 2018 which showed polyps in his transverse and descending colon which were taken out and were negative for  high-grade dysplasia or malignancy. Patient subsequently underwent colostomy takedown procedure by Dr. Burt Knack  9. Repeat CT chest abdomen/ pelvis showed increase in the size of previously seen lung nodules largest being 1.4 cm consistent with metastatic disease. PET/CT showed mild uptake in 2 lung nodules. Others were not hypermetabolic. No other evidence of metastatic disease  10. Repeat lung biopsy consistent with colon adenocarcinoma. Comprehensive RAS panel testing showed mutant KRAS  11.Given the slow rate of growth of his colon cancer and desire to maintain his quality of life plan was to start chemotherapy after he gets a 13-monthbreak in summer. Repeat CT chest abdomen and pelvis in August 2019 showed mild increase in the size of his lung nodules. No new lung nodules are no new sites of distant metastatic disease.FOLFOX Avastin chemotherapy started on 06/22/2018.Patient completed 12 cycles of FOLFOX Avastin chemotherapy with continued response to his lung lesions. Oxaliplatin was dropped and patient is currently on 5-FU Avastin  Interval history-feels well today and denies any complaints.  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  ECOG PS- 0 Pain scale- 0   Review of systems- Review of Systems  Constitutional: Negative for chills, fever, malaise/fatigue and weight loss.  HENT: Negative for congestion, ear discharge and nosebleeds.   Eyes: Negative for blurred vision.  Respiratory: Negative for cough, hemoptysis, sputum production, shortness of breath and wheezing.   Cardiovascular: Negative for chest pain, palpitations, orthopnea and claudication.  Gastrointestinal: Negative for abdominal pain, blood in stool, constipation, diarrhea, heartburn, melena, nausea and vomiting.  Genitourinary: Negative for dysuria, flank pain, frequency, hematuria and urgency.  Musculoskeletal: Negative for back pain, joint pain and myalgias.  Skin: Negative for rash.   Neurological: Positive  for sensory change (Peripheral neuropathy). Negative for dizziness, tingling, focal weakness, seizures, weakness and headaches.  Endo/Heme/Allergies: Does not bruise/bleed easily.  Psychiatric/Behavioral: Negative for depression and suicidal ideas. The patient does not have insomnia.       No Known Allergies   Past Medical History:  Diagnosis Date  . Cancer ([place])    skin cancer-nose w graft,also shoulder- basal cell per pt  . Cataract    r eye  . Chronic kidney disease    kidney stones 20 y ago per pt  . Colon cancer ([place])   . Diabetes mellitus without complication ([place])   . Eczema   . History of kidney stones   . Hyperlipidemia   . Hypertension   . Vertigo      Past Surgical History:  Procedure Laterality Date  . CATARACT EXTRACTION Left   . COLECTOMY WITH COLOSTOMY CREATION/HARTMANN PROCEDURE N/A 02/06/2017   Procedure: COLECTOMY WITH COLOSTOMY CREATION/HARTMANN PROCEDURE;  Surgeon: [name], MD;  Location: [facility] ORS;  Service: General;  Laterality: N/A;  . COLON SURGERY    . COLONOSCOPY WITH PROPOFOL N/A 09/04/2017   Procedure: COLONOSCOPY WITH PROPOFOL;  Surgeon: Jonathon Bellows, MD;  Location: [facility] ENDOSCOPY;  Service: Gastroenterology;  Laterality: N/A;  . COLOSTOMY CLOSURE N/A 10/20/2017   Procedure: COLOSTOMY CLOSURE;  Surgeon: [name], MD;  Location: [facility] ORS;  Service: General;  Laterality: N/A;  . CYSTOSCOPY    . FLEXIBLE SIGMOIDOSCOPY N/A 02/06/2017   Procedure: FLEXIBLE SIGMOIDOSCOPY;  Surgeon: Christene Lye, MD;  Location: [facility] ENDOSCOPY;  Service: Endoscopy;  Laterality: N/A;  . PORTACATH PLACEMENT N/A 06/17/2018   Procedure: INSERTION PORT-A-CATH, WITH FLUOROSCOPY;  Surgeon: [name], MD;  Location: [facility] ORS;  Service: General;  Laterality: N/A;    Social History   Socioeconomic History  . Marital status: Married    Spouse name: Not on file  . Number of children: Not on file  . Years of education: Not  on file  . Highest education level: Not on file  Occupational History  . Not on file  Social Needs  . Financial resource strain: Not on file  . Food insecurity    Worry: Not on file    Inability: Not on file  . Transportation needs    Medical: Not on file    Non-medical: Not on file  Tobacco Use  . Smoking status: Never Smoker  . Smokeless tobacco: Never Used  Substance and Sexual Activity  . Alcohol use: No  . Drug use: No  . Sexual activity: Not Currently  Lifestyle  . Physical activity    Days per week: Not on file    Minutes per session: Not on file  . Stress: Not on file  Relationships  . Social Herbalist on phone: Not on file    Gets together: Not on file    Attends religious service: Not on file    Active member of club or organization: Not on file    Attends meetings of clubs or organizations: Not on file    Relationship status: Not on file  . Intimate partner violence    Fear of current or ex partner: Not on file    Emotionally abused: Not on file    Physically abused: Not on file    Forced sexual activity: Not on file  Other Topics Concern  . Not on file  Social History Narrative  . Not on file    Family History  Problem Relation  Age of Onset  . Diabetes Mother   . AAA (abdominal aortic aneurysm) Mother   . Coronary artery disease Mother   . Diabetes Father   . Coronary artery disease Father   . Dementia Father   . Breast cancer Sister      Current Outpatient Medications:  .  acetaminophen (TYLENOL) 500 MG tablet, Take 500 mg by mouth every 6 (six) hours as needed (for pain.)., Disp: , Rfl:  .  aspirin EC 81 MG tablet, Take 81 mg by mouth daily., Disp: , Rfl:  .  Docusate Sodium (COLACE PO), Take 1 Dose by mouth as needed., Disp: , Rfl:  .  Dulaglutide 0.75 MG/0.5ML SOPN, Inject into the skin once a week. , Disp: , Rfl:  .  gabapentin (NEURONTIN) 300 MG capsule, Take 3 capsules (900 mg total) by mouth 2 (two) times daily., Disp: 180  capsule, Rfl: 2 .  glimepiride (AMARYL) 2 MG tablet, Take 2 mg by mouth 2 (two) times daily before a meal. , Disp: , Rfl:  .  lidocaine-prilocaine (EMLA) cream, Place a small amount over cream over port 1 hour before each treatment, cover cream with saran wrap for clothing protection, Disp: 30 g, Rfl: 1 .  loratadine (CLARITIN) 10 MG tablet, Take 10 mg by mouth daily., Disp: , Rfl:  .  losartan (COZAAR) 25 MG tablet, Take 25 mg by mouth daily., Disp: , Rfl:  .  metFORMIN (GLUCOPHAGE) 1000 MG tablet, Take 1,000-1,500 tablets by mouth See admin instructions. TAKE 1 TABLET (1000 MG) BY MOUTH IN THE MORNING & TAKE 1.5 TABLETS (1500 MG) BY MOUTH AT SUPPER, Disp: , Rfl:  .  pravastatin (PRAVACHOL) 20 MG tablet, Take 20 mg by mouth daily with supper. , Disp: , Rfl:  .  ondansetron (ZOFRAN) 8 MG tablet, Take 1 tablet (8 mg total) by mouth 2 (two) times daily as needed for refractory nausea / vomiting. Start on day 3 after chemotherapy. (Patient not taking: Reported on 07/15/2019), Disp: 30 tablet, Rfl: 1 .  prochlorperazine (COMPAZINE) 10 MG tablet, Take 1 tablet (10 mg total) by mouth every 6 (six) hours as needed (Nausea or vomiting). (Patient not taking: Reported on 07/15/2019), Disp: 30 tablet, Rfl: 1 .  triamcinolone (KENALOG) 0.025 % cream, Apply 1 application topically daily as needed (FOR EZCEMA). (Patient not taking: Reported on 08/02/2019), Disp: 30 g, Rfl: 2 No current facility-administered medications for this visit.   Facility-Administered Medications Ordered in Other Visits:  .  0.9 %  sodium chloride infusion, , Intravenous, Continuous, Sindy Guadeloupe, MD, Stopped at 07/27/18 1037 .  fluorouracil (ADRUCIL) 4,850 mg in sodium chloride 0.9 % 53 mL chemo infusion, 2,400 mg/m2 (Treatment Plan Recorded), Intravenous, 1 day or 1 dose, Sindy Guadeloupe, MD, 4,850 mg at 08/02/19 1150 .  heparin lock flush 100 unit/mL, 500 Units, Intravenous, Once, Sindy Guadeloupe, MD .  sodium chloride flush (NS) 0.9 %  injection 10 mL, 10 mL, Intravenous, PRN, Sindy Guadeloupe, MD, 10 mL at 07/27/18 0920  Physical exam:  Vitals:   08/02/19 0836  BP: 137/64  Pulse: 75  Resp: 16  Temp: 97.9 F (36.6 C)  TempSrc: Tympanic  Weight: 186 lb 14.4 oz (84.8 kg)  Height: '5\' 6"'  (1.676 m)   Physical Exam HENT:     Head: Normocephalic and atraumatic.  Eyes:     Pupils: Pupils are equal, round, and reactive to light.  Neck:     Musculoskeletal: Normal range of motion.  Cardiovascular:  Rate and Rhythm: Normal rate and regular rhythm.     Heart sounds: Normal heart sounds.  Pulmonary:     Effort: Pulmonary effort is normal.     Breath sounds: Normal breath sounds.  Abdominal:     General: Bowel sounds are normal.     Palpations: Abdomen is soft.  Skin:    General: Skin is warm and dry.  Neurological:     Mental Status: He is alert and oriented to person, place, and time.      CMP Latest Ref Rng & Units 08/02/2019  Glucose 70 - 99 mg/dL 265(H)  BUN 8 - 23 mg/dL 16  Creatinine 0.61 - 1.24 mg/dL 1.33(H)  Sodium 135 - 145 mmol/L 137  Potassium 3.5 - 5.1 mmol/L 4.5  Chloride 98 - 111 mmol/L 103  CO2 22 - 32 mmol/L 23  Calcium 8.9 - 10.3 mg/dL 9.2  Total Protein 6.5 - 8.1 g/dL 7.1  Total Bilirubin 0.3 - 1.2 mg/dL 0.6  Alkaline Phos 38 - 126 U/L 108  AST 15 - 41 U/L 31  ALT 0 - 44 U/L 23   CBC Latest Ref Rng & Units 08/02/2019  WBC 4.0 - 10.5 K/uL 9.1  Hemoglobin 13.0 - 17.0 g/dL 12.2(L)  Hematocrit 39.0 - 52.0 % 38.2(L)  Platelets 150 - 400 K/uL 150      Assessment and plan- Patient is a 78 y.o. male withstage IV colon adenocarcinoma with lung metastases. He is here for on treatment assessment prior to cycle 23 of 5-FU and Avastin chemotherapy  Counts okay to proceed with cycle 23 of 5-FU Mvasi chemotherapy today.  His blood pressure is stable and urine protein is trace.  His creatinine has been fluctuating between 1.1-1.3.  I have again emphasized him to keep up with his fluid intake  especially around his upcoming scans.  He will come back on day 3 for pump disconnect and receive Udenyca on that day.  I will see him back in 2 weeks time with CBC with differential, CMP and urine protein for cycle 24.  CT chest abdomen and pelvis with contrast to be done prior  Hyperglycemia: I have asked him to discuss this further with Dr. Netty Starring if his diabetic medications need to be adjusted.  We will cut down his Decadron dose in his premedications today   Visit Diagnosis 1. Malignant neoplasm metastatic to lung, unspecified laterality ([place])   2. 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[place])   3. Encounter for antineoplastic chemotherapy   4. AKI (acute kidney injury) ([place])   5. Hyperglycemia      Dr. Randa Evens, MD, MPH [facility] [phone] 08/02/2019 1:07 PM

## 2019-08-03 ENCOUNTER — Telehealth: Payer: Self-pay | Admitting: *Deleted

## 2019-08-03 NOTE — Telephone Encounter (Signed)
Pt had asked me to tell scheduler to make ct appt early am due to his diabetes.His scan was not scheduled for am it was for 3:30. I called today and got it rescheduled to am 10/1 8 am and arrival at 7:45 and same instructions: NPO 4 hours prior to scan, pick up prep kit at least 1 day before scan. All of this info I left on his voicemail and if he has questions then he can call me.

## 2019-08-04 ENCOUNTER — Other Ambulatory Visit: Payer: Self-pay

## 2019-08-04 ENCOUNTER — Inpatient Hospital Stay: Payer: Medicare Other

## 2019-08-04 DIAGNOSIS — C7800 Secondary malignant neoplasm of unspecified lung: Secondary | ICD-10-CM

## 2019-08-04 DIAGNOSIS — C189 Malignant neoplasm of colon, unspecified: Secondary | ICD-10-CM

## 2019-08-04 DIAGNOSIS — Z5112 Encounter for antineoplastic immunotherapy: Secondary | ICD-10-CM | POA: Diagnosis not present

## 2019-08-04 MED ORDER — HEPARIN SOD (PORK) LOCK FLUSH 100 UNIT/ML IV SOLN
500.0000 [IU] | Freq: Once | INTRAVENOUS | Status: AC
  Administered 2019-08-04: 500 [IU] via INTRAVENOUS
  Filled 2019-08-04: qty 5

## 2019-08-04 MED ORDER — SODIUM CHLORIDE 0.9% FLUSH
10.0000 mL | INTRAVENOUS | Status: DC | PRN
  Administered 2019-08-04: 10 mL via INTRAVENOUS
  Filled 2019-08-04: qty 10

## 2019-08-04 MED ORDER — PEGFILGRASTIM-CBQV 6 MG/0.6ML ~~LOC~~ SOSY
6.0000 mg | PREFILLED_SYRINGE | Freq: Once | SUBCUTANEOUS | Status: AC
  Administered 2019-08-04: 11:00:00 6 mg via SUBCUTANEOUS
  Filled 2019-08-04: qty 0.6

## 2019-08-11 ENCOUNTER — Ambulatory Visit: Payer: Medicare Other

## 2019-08-11 ENCOUNTER — Other Ambulatory Visit: Payer: Self-pay

## 2019-08-11 ENCOUNTER — Ambulatory Visit
Admission: RE | Admit: 2019-08-11 | Discharge: 2019-08-11 | Disposition: A | Discharge status: Home / Self Care | Payer: Medicare Other | Source: Ambulatory Visit | Attending: Oncology | Admitting: Oncology

## 2019-08-11 DIAGNOSIS — K8020 Calculus of gallbladder without cholecystitis without obstruction: Secondary | ICD-10-CM | POA: Insufficient documentation

## 2019-08-11 DIAGNOSIS — J9811 Atelectasis: Secondary | ICD-10-CM | POA: Insufficient documentation

## 2019-08-11 DIAGNOSIS — N200 Calculus of kidney: Secondary | ICD-10-CM | POA: Diagnosis not present

## 2019-08-11 DIAGNOSIS — I2510 Atherosclerotic heart disease of native coronary artery without angina pectoris: Secondary | ICD-10-CM | POA: Diagnosis not present

## 2019-08-11 DIAGNOSIS — C7800 Secondary malignant neoplasm of unspecified lung: Secondary | ICD-10-CM | POA: Diagnosis not present

## 2019-08-11 MED ORDER — IOHEXOL 300 MG/ML  SOLN
85.0000 mL | Freq: Once | INTRAMUSCULAR | Status: AC | PRN
  Administered 2019-08-11: 85 mL via INTRAVENOUS

## 2019-08-12 ENCOUNTER — Other Ambulatory Visit: Payer: Self-pay | Admitting: Oncology

## 2019-08-12 DIAGNOSIS — C7800 Secondary malignant neoplasm of unspecified lung: Secondary | ICD-10-CM

## 2019-08-12 DIAGNOSIS — C189 Malignant neoplasm of colon, unspecified: Secondary | ICD-10-CM

## 2019-08-12 NOTE — Progress Notes (Signed)
START ON PATHWAY REGIMEN - Colorectal     A cycle is every 14 days:     Bevacizumab-xxxx      Irinotecan      Leucovorin      Fluorouracil      Fluorouracil   **Always confirm dose/schedule in your pharmacy ordering system**  Patient Characteristics: Distant Metastases, Nonsurgical Candidate, KRAS/NRAS Mutation Positive/Unknown (BRAF V600 Wild-Type/Unknown), Standard Cytotoxic Therapy, Second Line Standard Cytotoxic Therapy, Bevacizumab Eligible Tumor Location: Colon Therapeutic Status: Distant Metastases Microsatellite/Mismatch Repair Status: MSS/pMMR BRAF Mutation Status: Wild-Type (no mutation) KRAS/NRAS Mutation Status: Mutation Positive Standard Cytotoxic Line of Therapy: Second Line Standard Cytotoxic Therapy Bevacizumab Eligibility: Eligible Intent of Therapy: Non-Curative / Palliative Intent, Discussed with Patient 

## 2019-08-16 ENCOUNTER — Inpatient Hospital Stay: Payer: Medicare Other

## 2019-08-16 ENCOUNTER — Encounter: Payer: Self-pay | Admitting: Oncology

## 2019-08-16 ENCOUNTER — Other Ambulatory Visit: Payer: Self-pay

## 2019-08-16 ENCOUNTER — Inpatient Hospital Stay (HOSPITAL_BASED_OUTPATIENT_CLINIC_OR_DEPARTMENT_OTHER): Payer: Medicare Other | Admitting: Oncology

## 2019-08-16 ENCOUNTER — Inpatient Hospital Stay: Payer: Medicare Other | Attending: Oncology

## 2019-08-16 VITALS — BP 161/64 | HR 74 | Temp 96.5°F | Resp 16 | Ht 66.0 in | Wt 191.0 lb

## 2019-08-16 DIAGNOSIS — Z7189 Other specified counseling: Secondary | ICD-10-CM | POA: Diagnosis not present

## 2019-08-16 DIAGNOSIS — C187 Malignant neoplasm of sigmoid colon: Secondary | ICD-10-CM | POA: Diagnosis present

## 2019-08-16 DIAGNOSIS — C189 Malignant neoplasm of colon, unspecified: Secondary | ICD-10-CM | POA: Diagnosis not present

## 2019-08-16 DIAGNOSIS — Z5111 Encounter for antineoplastic chemotherapy: Secondary | ICD-10-CM

## 2019-08-16 DIAGNOSIS — C7800 Secondary malignant neoplasm of unspecified lung: Secondary | ICD-10-CM

## 2019-08-16 DIAGNOSIS — Z5189 Encounter for other specified aftercare: Secondary | ICD-10-CM | POA: Diagnosis not present

## 2019-08-16 DIAGNOSIS — Z95828 Presence of other vascular implants and grafts: Secondary | ICD-10-CM

## 2019-08-16 DIAGNOSIS — Z5112 Encounter for antineoplastic immunotherapy: Secondary | ICD-10-CM | POA: Diagnosis present

## 2019-08-16 LAB — CBC WITH DIFFERENTIAL/PLATELET
Abs Immature Granulocytes: 0.1 10*3/uL — ABNORMAL HIGH (ref 0.00–0.07)
Basophils Absolute: 0.1 10*3/uL (ref 0.0–0.1)
Basophils Relative: 1 %
Eosinophils Absolute: 0.3 10*3/uL (ref 0.0–0.5)
Eosinophils Relative: 4 %
HCT: 36.3 % — ABNORMAL LOW (ref 39.0–52.0)
Hemoglobin: 11.5 g/dL — ABNORMAL LOW (ref 13.0–17.0)
Immature Granulocytes: 1 %
Lymphocytes Relative: 30 %
Lymphs Abs: 2.5 10*3/uL (ref 0.7–4.0)
MCH: 30.4 pg (ref 26.0–34.0)
MCHC: 31.7 g/dL (ref 30.0–36.0)
MCV: 96 fL (ref 80.0–100.0)
Monocytes Absolute: 0.5 10*3/uL (ref 0.1–1.0)
Monocytes Relative: 6 %
Neutro Abs: 4.9 10*3/uL (ref 1.7–7.7)
Neutrophils Relative %: 58 %
Platelets: 170 10*3/uL (ref 150–400)
RBC: 3.78 MIL/uL — ABNORMAL LOW (ref 4.22–5.81)
RDW: 16.2 % — ABNORMAL HIGH (ref 11.5–15.5)
WBC: 8.4 10*3/uL (ref 4.0–10.5)
nRBC: 0 % (ref 0.0–0.2)

## 2019-08-16 LAB — COMPREHENSIVE METABOLIC PANEL
ALT: 27 U/L (ref 0–44)
AST: 31 U/L (ref 15–41)
Albumin: 3.8 g/dL (ref 3.5–5.0)
Alkaline Phosphatase: 111 U/L (ref 38–126)
Anion gap: 12 (ref 5–15)
BUN: 17 mg/dL (ref 8–23)
CO2: 19 mmol/L — ABNORMAL LOW (ref 22–32)
Calcium: 9.1 mg/dL (ref 8.9–10.3)
Chloride: 104 mmol/L (ref 98–111)
Creatinine, Ser: 1.26 mg/dL — ABNORMAL HIGH (ref 0.61–1.24)
GFR calc Af Amer: >60 mL/min (ref >60)
GFR calc non Af Amer: 54 mL/min — ABNORMAL LOW (ref >60)
Glucose, Bld: 221 mg/dL — ABNORMAL HIGH (ref 70–99)
Potassium: 4.4 mmol/L (ref 3.5–5.1)
Sodium: 135 mmol/L (ref 135–145)
Total Bilirubin: 0.3 mg/dL (ref 0.3–1.2)
Total Protein: 6.7 g/dL (ref 6.5–8.1)

## 2019-08-16 LAB — PROTEIN, URINE, RANDOM: Total Protein, Urine: 10 mg/dL

## 2019-08-16 MED ORDER — PALONOSETRON HCL INJECTION 0.25 MG/5ML
0.2500 mg | Freq: Once | INTRAVENOUS | Status: AC
  Administered 2019-08-16: 0.25 mg via INTRAVENOUS
  Filled 2019-08-16: qty 5

## 2019-08-16 MED ORDER — IRINOTECAN HCL CHEMO INJECTION 100 MG/5ML
150.0000 mg/m2 | Freq: Once | INTRAVENOUS | Status: AC
  Administered 2019-08-16: 300 mg via INTRAVENOUS
  Filled 2019-08-16: qty 15

## 2019-08-16 MED ORDER — LEUCOVORIN CALCIUM INJECTION 350 MG
402.0000 mg/m2 | Freq: Once | INTRAVENOUS | Status: AC
  Administered 2019-08-16: 11:00:00 800 mg via INTRAVENOUS
  Filled 2019-08-16: qty 40

## 2019-08-16 MED ORDER — ATROPINE SULFATE 1 MG/ML IJ SOLN
0.5000 mg | Freq: Once | INTRAMUSCULAR | Status: AC | PRN
  Administered 2019-08-16: 0.5 mg via INTRAVENOUS
  Filled 2019-08-16: qty 1

## 2019-08-16 MED ORDER — SODIUM CHLORIDE 0.9 % IV SOLN
5.0000 mg/kg | Freq: Once | INTRAVENOUS | Status: AC
  Administered 2019-08-16: 400 mg via INTRAVENOUS
  Filled 2019-08-16: qty 16

## 2019-08-16 MED ORDER — SODIUM CHLORIDE 0.9% FLUSH
10.0000 mL | Freq: Once | INTRAVENOUS | Status: AC
  Administered 2019-08-16: 10 mL via INTRAVENOUS
  Filled 2019-08-16: qty 10

## 2019-08-16 MED ORDER — SODIUM CHLORIDE 0.9 % IV SOLN
2400.0000 mg/m2 | INTRAVENOUS | Status: DC
  Administered 2019-08-16: 4800 mg via INTRAVENOUS
  Filled 2019-08-16: qty 96

## 2019-08-16 MED ORDER — FLUOROURACIL CHEMO INJECTION 2.5 GM/50ML
400.0000 mg/m2 | Freq: Once | INTRAVENOUS | Status: AC
  Administered 2019-08-16: 800 mg via INTRAVENOUS
  Filled 2019-08-16: qty 16

## 2019-08-16 MED ORDER — DEXAMETHASONE SODIUM PHOSPHATE 10 MG/ML IJ SOLN
10.0000 mg | Freq: Once | INTRAMUSCULAR | Status: AC
  Administered 2019-08-16: 10 mg via INTRAVENOUS
  Filled 2019-08-16: qty 1

## 2019-08-16 MED ORDER — SODIUM CHLORIDE 0.9 % IV SOLN: 10.0000 mg | Freq: Once | INTRAVENOUS | Status: DC

## 2019-08-16 MED ORDER — SODIUM CHLORIDE 0.9 % IV SOLN
Freq: Once | INTRAVENOUS | Status: AC
  Administered 2019-08-16: 10:00:00 via INTRAVENOUS
  Filled 2019-08-16: qty 250

## 2019-08-16 NOTE — Progress Notes (Signed)
Pt has pt has no complaints for today.

## 2019-08-16 NOTE — Progress Notes (Signed)
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  On treatment assessment prior to cycle 24 of 5-FU Avastin chemotherapy  Heme/Onc history: 1. Patient is a 78 year old male who presented with evidence of bowel obstruction on 02/06/2017. At that time he had progressive abdominal distention and no bowel movement for about one week. CT abdomen showed an apple core lesion in the descending/sigmoid colon.  2. Patient underwent Hartmann's procedure for obstructing sigmoid colon mass on 02/06/2017. Preoperative CEA was 5.0  3. 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 It is 0 out of 15 lymph nodes were positive for malignancy. Perineural and lymphovascular invasion was present. Margins were negative.pT3N0. MMR stable.  4. Given that he had high risk stage II colon cancer including he presented with obstruction and has LVI and perineural invasion- adjuvant xeloda chemotherapy was recommended for 6 months.   5.Patient had evidence of hand foot syndrome after cycle 3. Cycle 4 delayed by 1 week. Topical urea cream prescribed  6. Patient completed 8 cycles of adjuvant xeloda in October 2018. polps seen in sigmoid and decending colon but negative for malignancy  7. Repeat Ct abdomen after 8 cycles showed no metastatic disease. 6 mm lung nodule noted in RLL  8.Patient had a repeat colonoscopy in October 2018 which showed polyps in his transverse and descending colon  which were taken out and were negative for high-grade dysplasia or malignancy. Patient subsequently underwent colostomy takedown procedure by Dr. Burt Knack  9. Repeat CT chest abdomen/ pelvis showed increase in the size of previously seen lung nodules largest being 1.4 cm consistent with metastatic disease. PET/CT showed mild uptake in 2 lung nodules. Others were not hypermetabolic. No other evidence of metastatic disease  10. Repeat lung biopsy consistent with colon adenocarcinoma. Comprehensive RAS panel testing showed mutant KRAS  11.Given the slow rate of growth of his colon cancer and desire to maintain his quality of life plan was to start chemotherapy after he gets a 21-monthbreak in summer. Repeat CT chest abdomen and pelvis in August 2019 showed mild increase in the size of his lung nodules. No new lung nodules are no new sites of distant metastatic disease.FOLFOX Avastin chemotherapy started on 06/22/2018.Patient completed 12 cycles of FOLFOX Avastin chemotherapy with continued response to his lung lesions. 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  Denies any new complaints at this time  ECOG PS- 1 Pain scale- 0   Review of systems- Review of Systems  Constitutional: Negative for chills, fever, malaise/fatigue and weight loss.  HENT: Negative for congestion, ear discharge and nosebleeds.   Eyes: Negative for blurred vision.  Respiratory: Negative for cough, hemoptysis, sputum production, shortness of breath and wheezing.   Cardiovascular: Negative for chest pain, palpitations, orthopnea and claudication.  Gastrointestinal: Negative for abdominal pain, blood in stool, constipation, diarrhea, heartburn, melena, nausea and vomiting.  Genitourinary: Negative for dysuria, flank pain, frequency, hematuria and urgency.  Musculoskeletal: Negative for back pain, joint  pain and myalgias.  Skin: Negative for rash.  Neurological: Positive for sensory change (Peripheral neuropathy). Negative for dizziness, tingling, focal weakness, seizures, weakness and headaches.  Endo/Heme/Allergies: Does not bruise/bleed easily.  Psychiatric/Behavioral: Negative for depression and suicidal ideas. The patient does not have insomnia.       No Known Allergies   Past Medical History:  Diagnosis Date   Cancer ([place])    skin cancer-nose w graft,also shoulder- basal cell per pt   Cataract    r eye   Chronic kidney disease    kidney stones 20 y ago per pt   Colon cancer ([place]) 2018   Surgical resection and chemo tx's.   Diabetes mellitus without complication ([place])    Eczema    History of kidney stones    Hyperlipidemia    Hypertension    Vertigo      Past Surgical History:  Procedure Laterality Date   CATARACT EXTRACTION Left    COLECTOMY WITH COLOSTOMY CREATION/HARTMANN PROCEDURE N/A 02/06/2017   Procedure: COLECTOMY WITH COLOSTOMY CREATION/HARTMANN PROCEDURE;  Surgeon: [name], MD;  Location: [facility] ORS;  Service: General;  Laterality: N/A;   COLON SURGERY     COLONOSCOPY WITH PROPOFOL N/A 09/04/2017   Procedure: COLONOSCOPY WITH PROPOFOL;  Surgeon: Jonathon Bellows, MD;  Location[facility]COPY;  Service: Gastroenterology;  Laterality: N/A;   COLOSTOMY CLOSURE N/A 10/20/2017   Procedure: COLOSTOMY CLOSURE;  Surgeon: [name], MD;  Location: [facility] ORS;  Service: General;  Laterality: N/A;   CYSTOSCOPY     FLEXIBLE SIGMOIDOSCOPY N/A 02/06/2017   Procedure: FLEXIBLE SIGMOIDOSCOPY;  Surgeon: Christene Lye, MD;  Location: [facility] ENDOSCOPY;  Service: Endoscopy;  Laterality: N/A;   PORTACATH PLACEMENT N/A 06/17/2018   Procedure: INSERTION PORT-A-CATH, WITH FLUOROSCOPY;  Surgeon: [name], MD;  Location: [facility] ORS;  Service: General;  Laterality: N/A;    Social History   Socioeconomic History   Marital status: Married     Spouse name: Not on file   Number of children: Not on file   Years of education: Not on file   Highest education level: Not on file  Occupational History   Not on file  Social Needs   Financial resource strain: Not on file   Food insecurity    Worry: Not on file    Inability: Not on file   Transportation needs    Medical: Not on file    Non-medical: Not on file  Tobacco Use   Smoking status: Never Smoker   Smokeless tobacco: Never Used  Substance and Sexual Activity   Alcohol use: No   Drug use: No   Sexual activity: Not Currently  Lifestyle   Physical activity    Days per week: Not on file    Minutes per session: Not on file   Stress: Not on file  Relationships   Social connections    Talks on phone: Not on file    Gets together: Not on file    Attends religious service: Not on file    Active member of club or organization: Not on file    Attends meetings of clubs or organizations: Not on file    Relationship status: Not on file   Intimate partner violence    Fear of current or ex partner: Not on file    Emotionally abused: Not on file    Physically abused: Not on file    Forced sexual activity: Not on file  Other Topics Concern   Not on  file  Social History Narrative   Not on file    Family History  Problem Relation Age of Onset   Diabetes Mother    AAA (abdominal aortic aneurysm) Mother    Coronary artery disease Mother    Diabetes Father    Coronary artery disease Father    Dementia Father    Breast cancer Sister      Current Outpatient Medications:    acetaminophen (TYLENOL) 500 MG tablet, Take 500 mg by mouth every 6 (six) hours as needed (for pain.)., Disp: , Rfl:    aspirin EC 81 MG tablet, Take 81 mg by mouth daily., Disp: , Rfl:    Docusate Sodium (COLACE PO), Take 1 Dose by mouth as needed., Disp: , Rfl:    Dulaglutide 0.75 MG/0.5ML SOPN, Inject into the skin once a week. , Disp: , Rfl:    gabapentin (NEURONTIN)  300 MG capsule, Take 3 capsules (900 mg total) by mouth 2 (two) times daily., Disp: 180 capsule, Rfl: 2   glimepiride (AMARYL) 2 MG tablet, Take 2 mg by mouth 2 (two) times daily before a meal. , Disp: , Rfl:    lidocaine-prilocaine (EMLA) cream, Place a small amount over cream over port 1 hour before each treatment, cover cream with saran wrap for clothing protection, Disp: 30 g, Rfl: 1   loratadine (CLARITIN) 10 MG tablet, Take 10 mg by mouth daily., Disp: , Rfl:    losartan (COZAAR) 25 MG tablet, Take 25 mg by mouth daily., Disp: , Rfl:    metFORMIN (GLUCOPHAGE) 1000 MG tablet, Take 1,000-1,500 tablets by mouth See admin instructions. TAKE 1 TABLET (1000 MG) BY MOUTH IN THE MORNING & TAKE 1.5 TABLETS (1500 MG) BY MOUTH AT SUPPER, Disp: , Rfl:    pravastatin (PRAVACHOL) 20 MG tablet, Take 20 mg by mouth daily with supper. , Disp: , Rfl:    triamcinolone (KENALOG) 0.025 % cream, Apply 1 application topically daily as needed (FOR EZCEMA)., Disp: 30 g, Rfl: 2 No current facility-administered medications for this visit.   Facility-Administered Medications Ordered in Other Visits:    0.9 %  sodium chloride infusion, , Intravenous, Continuous, Sindy Guadeloupe, MD, Stopped at 07/27/18 1037   0.9 %  sodium chloride infusion, , Intravenous, Once, Sindy Guadeloupe, MD   atropine injection 0.5 mg, 0.5 mg, Intravenous, Once PRN, Sindy Guadeloupe, MD   bevacizumab-bvzr (ZIRABEV) 400 mg in sodium chloride 0.9 % 100 mL chemo infusion, 5 mg/kg (Treatment Plan Recorded), Intravenous, Once, Sindy Guadeloupe, MD   dexamethasone (DECADRON) 10 mg in sodium chloride 0.9 % 50 mL IVPB, 10 mg, Intravenous, Once, Sindy Guadeloupe, MD   fluorouracil (ADRUCIL) 4,800 mg in sodium chloride 0.9 % 54 mL chemo infusion, 2,400 mg/m2 (Treatment Plan Recorded), Intravenous, 1 day or 1 dose, Sindy Guadeloupe, MD   fluorouracil (ADRUCIL) chemo injection 800 mg, 400 mg/m2 (Treatment Plan Recorded), Intravenous, Once, Sindy Guadeloupe,  MD   heparin lock flush 100 unit/mL, 500 Units, Intravenous, Once, Sindy Guadeloupe, MD   irinotecan (CAMPTOSAR) 300 mg in dextrose 5 % 500 mL chemo infusion, 150 mg/m2 (Treatment Plan Recorded), Intravenous, Once, Sindy Guadeloupe, MD   leucovorin 796 mg in dextrose 5 % 250 mL infusion, 400 mg/m2 (Treatment Plan Recorded), Intravenous, Once, Sindy Guadeloupe, MD   palonosetron (ALOXI) injection 0.25 mg, 0.25 mg, Intravenous, Once, Sindy Guadeloupe, MD   sodium chloride flush (NS) 0.9 % injection 10 mL, 10 mL, Intravenous, PRN, Randa Evens  C, MD, 10 mL at 07/27/18 0920  Physical exam:  Vitals:   08/16/19 0834  BP: (!) 161/64  Pulse: 74  Resp: 16  Temp: (!) 96.5 F (35.8 C)  TempSrc: Tympanic  Weight: 191 lb (86.6 kg)  Height: '5\' 6"'  (1.676 m)   Physical Exam Constitutional:      General: He is not in acute distress. HENT:     Head: Normocephalic and atraumatic.  Eyes:     Pupils: Pupils are equal, round, and reactive to light.  Neck:     Musculoskeletal: Normal range of motion.  Cardiovascular:     Rate and Rhythm: Normal rate and regular rhythm.     Heart sounds: Normal heart sounds.  Pulmonary:     Effort: Pulmonary effort is normal.     Breath sounds: Normal breath sounds.  Abdominal:     General: Bowel sounds are normal.     Palpations: Abdomen is soft.  Skin:    General: Skin is warm and dry.  Neurological:     Mental Status: He is alert and oriented to person, place, and time.      CMP Latest Ref Rng & Units 08/16/2019  Glucose 70 - 99 mg/dL 221(H)  BUN 8 - 23 mg/dL 17  Creatinine 0.61 - 1.24 mg/dL 1.26(H)  Sodium 135 - 145 mmol/L 135  Potassium 3.5 - 5.1 mmol/L 4.4  Chloride 98 - 111 mmol/L 104  CO2 22 - 32 mmol/L 19(L)  Calcium 8.9 - 10.3 mg/dL 9.1  Total Protein 6.5 - 8.1 g/dL 6.7  Total Bilirubin 0.3 - 1.2 mg/dL 0.3  Alkaline Phos 38 - 126 U/L 111  AST 15 - 41 U/L 31  ALT 0 - 44 U/L 27   CBC Latest Ref Rng & Units 08/16/2019  WBC 4.0 - 10.5 K/uL 8.4    Hemoglobin 13.0 - 17.0 g/dL 11.5(L)  Hematocrit 39.0 - 52.0 % 36.3(L)  Platelets 150 - 400 K/uL 170    No images are attached to the encounter.  Ct Chest W Contrast  Result Date: 08/11/2019 CLINICAL DATA:  Metastatic colon cancer EXAM: CT CHEST, ABDOMEN, AND PELVIS WITH CONTRAST TECHNIQUE: Multidetector CT imaging of the chest, abdomen and pelvis was performed following the standard protocol during bolus administration of intravenous contrast. CONTRAST:  62m OMNIPAQUE IOHEXOL 300 MG/ML SOLN, additional oral enteric contrast COMPARISON:  04/26/2019, 12/13/2018, PET-CT, 03/19/2018 FINDINGS: CT CHEST FINDINGS Cardiovascular: Left chest port catheter. Aortic atherosclerosis. Normal heart size. Extensive 3 vessel coronary artery calcifications. No pericardial effusion. Mediastinum/Nodes: No enlarged mediastinal, hilar, or axillary lymph nodes. Thyroid gland, trachea, and esophagus demonstrate no significant findings. Lungs/Pleura: Interval enlargement of a lobulated perihilar nodule of the left upper lobe measuring 1.7 x 1.5 cm, previously 1.4 x 1.1 cm (series 3, image 72). Interval enlargement of a left upper lobe nodule measuring 8 mm, previously 6 mm (series 3, image 58). Slight interval enlargement of a lobulated nodule of the lingula measuring 1.5 x 1.1 cm, previously 1.3 x 0.9 cm (series 3, image 79). Interval enlargement and increasingly solid appearance of a right lower lobe nodule measuring 8 mm, previously 6 mm (series 3, image 105). There is bandlike atelectasis of the left lung base with scattered adjacent small ground-glass opacities (series 3, image 14). No pleural effusion or pneumothorax. Musculoskeletal: No chest wall mass or suspicious bone lesions identified. CT ABDOMEN PELVIS FINDINGS Hepatobiliary: No solid liver abnormality is seen. Gallstones near the gallbladder neck. No gallbladder wall thickening, or biliary dilatation. Pancreas: Unremarkable.  No pancreatic ductal dilatation or  surrounding inflammatory changes. Spleen: Normal in size without significant abnormality. Adrenals/Urinary Tract: Adrenal glands are unremarkable. Tiny nonobstructive superior pole calculus of the right kidney. Bladder is unremarkable. Stomach/Bowel: Stomach is within normal limits. Appendix appears normal. No evidence of bowel wall thickening, distention, or inflammatory changes. Status post sigmoid colon resection and reanastomosis. Moderate burden of stool in the colon and rectum. Vascular/Lymphatic: Aortic atherosclerosis. No enlarged abdominal or pelvic lymph nodes. Reproductive: No mass or other abnormality. Other: No abdominal wall hernia or abnormality. No abdominopelvic ascites. Musculoskeletal: No acute or significant osseous findings. IMPRESSION: 1. Interval enlargement of a lobulated perihilar nodule of the left upper lobe measuring 1.7 x 1.5 cm, previously 1.4 x 1.1 cm (series 3, image 72). Interval enlargement of a left upper lobe nodule measuring 8 mm, previously 6 mm (series 3, image 58). Slight interval enlargement of a lobulated nodule of the lingula measuring 1.5 x 1.1 cm, previously 1.3 x 0.9 cm (series 3, image 79). Interval enlargement and increasingly solid appearance of a right lower lobe nodule measuring 8 mm, previously 6 mm (series 3, image 105). Findings are consistent with enlarging pulmonary metastases. 2. No other evidence of metastatic disease in the chest, abdomen, or pelvis. 3. Redemonstrated postoperative findings of sigmoid colon resection and reanastomosis. 4. There is bandlike atelectasis of the left lung base with scattered adjacent small ground-glass opacities (series 3, image 14), nonspecific and infectious or inflammatory. Attention on follow-up. 5.  Coronary artery disease.  Aortic Atherosclerosis (ICD10-I70.0). 6.  Cholelithiasis.  Nonobstructive right nephrolithiasis. Electronically Signed   By: Eddie Candle M.D.   On: 08/11/2019 09:40   Ct Abdomen Pelvis W  Contrast  Result Date: 08/11/2019 CLINICAL DATA:  Metastatic colon cancer EXAM: CT CHEST, ABDOMEN, AND PELVIS WITH CONTRAST TECHNIQUE: Multidetector CT imaging of the chest, abdomen and pelvis was performed following the standard protocol during bolus administration of intravenous contrast. CONTRAST:  8m OMNIPAQUE IOHEXOL 300 MG/ML SOLN, additional oral enteric contrast COMPARISON:  04/26/2019, 12/13/2018, PET-CT, 03/19/2018 FINDINGS: CT CHEST FINDINGS Cardiovascular: Left chest port catheter. Aortic atherosclerosis. Normal heart size. Extensive 3 vessel coronary artery calcifications. No pericardial effusion. Mediastinum/Nodes: No enlarged mediastinal, hilar, or axillary lymph nodes. Thyroid gland, trachea, and esophagus demonstrate no significant findings. Lungs/Pleura: Interval enlargement of a lobulated perihilar nodule of the left upper lobe measuring 1.7 x 1.5 cm, previously 1.4 x 1.1 cm (series 3, image 72). Interval enlargement of a left upper lobe nodule measuring 8 mm, previously 6 mm (series 3, image 58). Slight interval enlargement of a lobulated nodule of the lingula measuring 1.5 x 1.1 cm, previously 1.3 x 0.9 cm (series 3, image 79). Interval enlargement and increasingly solid appearance of a right lower lobe nodule measuring 8 mm, previously 6 mm (series 3, image 105). There is bandlike atelectasis of the left lung base with scattered adjacent small ground-glass opacities (series 3, image 14). No pleural effusion or pneumothorax. Musculoskeletal: No chest wall mass or suspicious bone lesions identified. CT ABDOMEN PELVIS FINDINGS Hepatobiliary: No solid liver abnormality is seen. Gallstones near the gallbladder neck. No gallbladder wall thickening, or biliary dilatation. Pancreas: Unremarkable. No pancreatic ductal dilatation or surrounding inflammatory changes. Spleen: Normal in size without significant abnormality. Adrenals/Urinary Tract: Adrenal glands are unremarkable. Tiny nonobstructive  superior pole calculus of the right kidney. Bladder is unremarkable. Stomach/Bowel: Stomach is within normal limits. Appendix appears normal. No evidence of bowel wall thickening, distention, or inflammatory changes. Status post sigmoid colon resection and reanastomosis. Moderate  burden of stool in the colon and rectum. Vascular/Lymphatic: Aortic atherosclerosis. No enlarged abdominal or pelvic lymph nodes. Reproductive: No mass or other abnormality. Other: No abdominal wall hernia or abnormality. No abdominopelvic ascites. Musculoskeletal: No acute or significant osseous findings. IMPRESSION: 1. Interval enlargement of a lobulated perihilar nodule of the left upper lobe measuring 1.7 x 1.5 cm, previously 1.4 x 1.1 cm (series 3, image 72). Interval enlargement of a left upper lobe nodule measuring 8 mm, previously 6 mm (series 3, image 58). Slight interval enlargement of a lobulated nodule of the lingula measuring 1.5 x 1.1 cm, previously 1.3 x 0.9 cm (series 3, image 79). Interval enlargement and increasingly solid appearance of a right lower lobe nodule measuring 8 mm, previously 6 mm (series 3, image 105). Findings are consistent with enlarging pulmonary metastases. 2. No other evidence of metastatic disease in the chest, abdomen, or pelvis. 3. Redemonstrated postoperative findings of sigmoid colon resection and reanastomosis. 4. There is bandlike atelectasis of the left lung base with scattered adjacent small ground-glass opacities (series 3, image 14), nonspecific and infectious or inflammatory. Attention on follow-up. 5.  Coronary artery disease.  Aortic Atherosclerosis (ICD10-I70.0). 6.  Cholelithiasis.  Nonobstructive right nephrolithiasis. Electronically Signed   By: Eddie Candle M.D.   On: 08/11/2019 09:40     Assessment and plan- Patient is a 78 y.o. male withstage IV colon adenocarcinoma with lung metastases. He is here to discuss CT scan results and from treatment assessment prior to cycle 24 of  5-FU Avastin chemotherapy  I have reviewed CT chest abdomen and pelvis images independently and discussed findings with patient and his sons.  Up until February 2020 patient had continued response to treatment and shrinkage of his lung lesions.  At that time he was on oxaliplatin.  Oxaliplatin was held since February 2020 given ongoing neuropathy and good response on the scans.  Subsequent CT scan in June 2020 showed slight enlargement of the existing lung nodules which have further grown on today's scans.  I therefore discussed the following options with the patient:  A.  Restarting oxalic platinum however this would be a challenge given his ongoing neuropathy which can potentially worsen and it would be difficult to continue oxalic platinum until progression or toxicity  B.  Adding irinotecan to 5-FU Avastin which would not worsen his existing neuropathy and likely to provide added benefit.  Discussed risks and benefits of irinotecan including all but not limited to nausea, vomiting, fatigue, low blood counts and risk of diarrhea.  Patient understands and agrees to proceed as planned.  Treatment will be given with a palliative intent.  Patient understands both his options and agrees to proceed with option B.  He will be receiving FOLFIRI Avastin chemotherapy today.  I have dose reduced irinotecan to 150 mg per metered squared given patient's age and risk for diarrhea.  We will be sending him prescription for Imodium today.  He will come back on day 3 for pump disconnect and receive Udenyca on that day.  I will see him back in 2 weeks time with port labs CBC with differential, CMP, urine protein to receive cycle 2 of FOLFIRI Avastin chemotherapy   Total face to face encounter time for this patient visit was 40 min. >50% of the time was  spent in counseling and coordination of care.     Visit Diagnosis 1. 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[place])   2. Encounter for antineoplastic chemotherapy   3. Goals of care,  counseling/discussion  Dr. Randa Evens, MD, MPH [facility] [phone] 08/16/2019 9:27 AM

## 2019-08-18 ENCOUNTER — Inpatient Hospital Stay: Payer: Medicare Other

## 2019-08-18 ENCOUNTER — Other Ambulatory Visit: Payer: Self-pay

## 2019-08-18 DIAGNOSIS — Z5111 Encounter for antineoplastic chemotherapy: Secondary | ICD-10-CM | POA: Diagnosis not present

## 2019-08-18 DIAGNOSIS — C7800 Secondary malignant neoplasm of unspecified lung: Secondary | ICD-10-CM

## 2019-08-18 DIAGNOSIS — C189 Malignant neoplasm of colon, unspecified: Secondary | ICD-10-CM

## 2019-08-18 MED ORDER — PEGFILGRASTIM-CBQV 6 MG/0.6ML ~~LOC~~ SOSY
6.0000 mg | PREFILLED_SYRINGE | Freq: Once | SUBCUTANEOUS | Status: AC
  Administered 2019-08-18: 6 mg via SUBCUTANEOUS
  Filled 2019-08-18: qty 0.6

## 2019-08-18 MED ORDER — HEPARIN SOD (PORK) LOCK FLUSH 100 UNIT/ML IV SOLN
INTRAVENOUS | Status: AC
  Filled 2019-08-18: qty 5

## 2019-08-18 MED ORDER — HEPARIN SOD (PORK) LOCK FLUSH 100 UNIT/ML IV SOLN
500.0000 [IU] | Freq: Once | INTRAVENOUS | Status: AC
  Administered 2019-08-18: 500 [IU] via INTRAVENOUS

## 2019-08-18 MED ORDER — SODIUM CHLORIDE 0.9% FLUSH
10.0000 mL | INTRAVENOUS | Status: DC | PRN
  Administered 2019-08-18: 10 mL via INTRAVENOUS
  Filled 2019-08-18: qty 10

## 2019-08-29 NOTE — Progress Notes (Signed)
Patient is coming in for follow up he is doing well no complaints. He had diarrhea  3-4 days following treatment but is better now.

## 2019-08-30 ENCOUNTER — Inpatient Hospital Stay: Payer: Medicare Other

## 2019-08-30 ENCOUNTER — Encounter: Payer: Self-pay | Admitting: Oncology

## 2019-08-30 ENCOUNTER — Other Ambulatory Visit: Payer: Self-pay

## 2019-08-30 ENCOUNTER — Inpatient Hospital Stay (HOSPITAL_BASED_OUTPATIENT_CLINIC_OR_DEPARTMENT_OTHER): Payer: Medicare Other | Admitting: Oncology

## 2019-08-30 VITALS — BP 157/70 | HR 67 | Temp 96.3°F | Resp 16 | Wt 192.9 lb

## 2019-08-30 VITALS — BP 152/70

## 2019-08-30 DIAGNOSIS — C189 Malignant neoplasm of colon, unspecified: Secondary | ICD-10-CM | POA: Diagnosis not present

## 2019-08-30 DIAGNOSIS — G620 Drug-induced polyneuropathy: Secondary | ICD-10-CM | POA: Diagnosis not present

## 2019-08-30 DIAGNOSIS — Z5111 Encounter for antineoplastic chemotherapy: Secondary | ICD-10-CM

## 2019-08-30 DIAGNOSIS — C7800 Secondary malignant neoplasm of unspecified lung: Secondary | ICD-10-CM

## 2019-08-30 DIAGNOSIS — Z5112 Encounter for antineoplastic immunotherapy: Secondary | ICD-10-CM

## 2019-08-30 DIAGNOSIS — T451X5A Adverse effect of antineoplastic and immunosuppressive drugs, initial encounter: Secondary | ICD-10-CM

## 2019-08-30 LAB — CBC WITH DIFFERENTIAL/PLATELET
Abs Immature Granulocytes: 0.61 10*3/uL — ABNORMAL HIGH (ref 0.00–0.07)
Basophils Absolute: 0.1 10*3/uL (ref 0.0–0.1)
Basophils Relative: 1 %
Eosinophils Absolute: 0.2 10*3/uL (ref 0.0–0.5)
Eosinophils Relative: 2 %
HCT: 34.2 % — ABNORMAL LOW (ref 39.0–52.0)
Hemoglobin: 10.6 g/dL — ABNORMAL LOW (ref 13.0–17.0)
Immature Granulocytes: 7 %
Lymphocytes Relative: 26 %
Lymphs Abs: 2.3 10*3/uL (ref 0.7–4.0)
MCH: 29.8 pg (ref 26.0–34.0)
MCHC: 31 g/dL (ref 30.0–36.0)
MCV: 96.1 fL (ref 80.0–100.0)
Monocytes Absolute: 0.6 10*3/uL (ref 0.1–1.0)
Monocytes Relative: 6 %
Neutro Abs: 5.2 10*3/uL (ref 1.7–7.7)
Neutrophils Relative %: 58 %
Platelets: 124 10*3/uL — ABNORMAL LOW (ref 150–400)
RBC: 3.56 MIL/uL — ABNORMAL LOW (ref 4.22–5.81)
RDW: 16.5 % — ABNORMAL HIGH (ref 11.5–15.5)
WBC: 8.9 10*3/uL (ref 4.0–10.5)
nRBC: 0.3 % — ABNORMAL HIGH (ref 0.0–0.2)

## 2019-08-30 LAB — PROTEIN, URINE, RANDOM: Total Protein, Urine: 21 mg/dL

## 2019-08-30 LAB — COMPREHENSIVE METABOLIC PANEL
ALT: 17 U/L (ref 0–44)
AST: 20 U/L (ref 15–41)
Albumin: 3.5 g/dL (ref 3.5–5.0)
Alkaline Phosphatase: 93 U/L (ref 38–126)
Anion gap: 8 (ref 5–15)
BUN: 14 mg/dL (ref 8–23)
CO2: 22 mmol/L (ref 22–32)
Calcium: 9.1 mg/dL (ref 8.9–10.3)
Chloride: 108 mmol/L (ref 98–111)
Creatinine, Ser: 1.13 mg/dL (ref 0.61–1.24)
GFR calc Af Amer: >60 mL/min (ref >60)
GFR calc non Af Amer: >60 mL/min (ref >60)
Glucose, Bld: 197 mg/dL — ABNORMAL HIGH (ref 70–99)
Potassium: 3.9 mmol/L (ref 3.5–5.1)
Sodium: 138 mmol/L (ref 135–145)
Total Bilirubin: 0.4 mg/dL (ref 0.3–1.2)
Total Protein: 6.5 g/dL (ref 6.5–8.1)

## 2019-08-30 MED ORDER — SODIUM CHLORIDE 0.9 % IV SOLN
Freq: Once | INTRAVENOUS | Status: AC
  Administered 2019-08-30: 09:00:00 via INTRAVENOUS
  Filled 2019-08-30: qty 250

## 2019-08-30 MED ORDER — SODIUM CHLORIDE 0.9 % IV SOLN
5.0000 mg/kg | Freq: Once | INTRAVENOUS | Status: AC
  Administered 2019-08-30: 400 mg via INTRAVENOUS
  Filled 2019-08-30: qty 16

## 2019-08-30 MED ORDER — FLUOROURACIL CHEMO INJECTION 2.5 GM/50ML
400.0000 mg/m2 | Freq: Once | INTRAVENOUS | Status: AC
  Administered 2019-08-30: 800 mg via INTRAVENOUS
  Filled 2019-08-30: qty 16

## 2019-08-30 MED ORDER — PALONOSETRON HCL INJECTION 0.25 MG/5ML
0.2500 mg | Freq: Once | INTRAVENOUS | Status: AC
  Administered 2019-08-30: 0.25 mg via INTRAVENOUS
  Filled 2019-08-30: qty 5

## 2019-08-30 MED ORDER — SODIUM CHLORIDE 0.9 % IV SOLN
2400.0000 mg/m2 | INTRAVENOUS | Status: DC
  Administered 2019-08-30: 4800 mg via INTRAVENOUS
  Filled 2019-08-30: qty 96

## 2019-08-30 MED ORDER — LEUCOVORIN CALCIUM INJECTION 350 MG
402.0000 mg/m2 | Freq: Once | INTRAVENOUS | Status: AC
  Administered 2019-08-30: 800 mg via INTRAVENOUS
  Filled 2019-08-30: qty 40

## 2019-08-30 MED ORDER — ATROPINE SULFATE 1 MG/ML IJ SOLN
0.5000 mg | Freq: Once | INTRAMUSCULAR | Status: AC | PRN
  Administered 2019-08-30: 0.5 mg via INTRAVENOUS
  Filled 2019-08-30: qty 1

## 2019-08-30 MED ORDER — SODIUM CHLORIDE 0.9% FLUSH
10.0000 mL | INTRAVENOUS | Status: DC | PRN
  Administered 2019-08-30: 10 mL via INTRAVENOUS
  Filled 2019-08-30: qty 10

## 2019-08-30 MED ORDER — DEXAMETHASONE SODIUM PHOSPHATE 10 MG/ML IJ SOLN
10.0000 mg | Freq: Once | INTRAMUSCULAR | Status: AC
  Administered 2019-08-30: 10:00:00 10 mg via INTRAVENOUS
  Filled 2019-08-30: qty 1

## 2019-08-30 MED ORDER — IRINOTECAN HCL CHEMO INJECTION 100 MG/5ML
150.0000 mg/m2 | Freq: Once | INTRAVENOUS | Status: AC
  Administered 2019-08-30: 300 mg via INTRAVENOUS
  Filled 2019-08-30: qty 15

## 2019-08-30 NOTE — Progress Notes (Signed)
Diarrhea lasted for 4 days-he took 2 imodium on day 4, the neuropathy seems worse with cold weather

## 2019-08-30 NOTE — Progress Notes (Signed)
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 Patient is a 78 year old male who presented with evidence of bowel obstruction on 02/06/2017. At that time he had progressive abdominal distention and no bowel movement for about one week. CT abdomen showed an apple core lesion in the descending/sigmoid colon.  2. Patient underwent Hartmann's procedure for obstructing sigmoid colon mass on 02/06/2017. Preoperative CEA was 5.0  3. 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 It is 0 out of 15 lymph nodes were positive for malignancy. Perineural and lymphovascular invasion was present. Margins were negative.pT3N0. MMR stable.  4. Given that he had high risk stage II colon cancer including he presented with obstruction and has LVI and perineural invasion- adjuvant xeloda chemotherapy was recommended for 6 months.   5.Patient had evidence of hand foot syndrome after cycle 3. Cycle 4 delayed by 1 week. Topical urea cream prescribed  6. Patient completed 8 cycles of adjuvant xeloda in October 2018. polps seen in sigmoid and decending colon but negative for malignancy  7. Repeat Ct abdomen after 8 cycles showed no metastatic disease. 6 mm lung nodule noted in RLL  8.Patient had a repeat colonoscopy in October 2018 which showed polyps in his transverse and descending colon which were taken out and were negative for  high-grade dysplasia or malignancy. Patient subsequently underwent colostomy takedown procedure by Dr. Burt Knack  9. Repeat CT chest abdomen/ pelvis showed increase in the size of previously seen lung nodules largest being 1.4 cm consistent with metastatic disease. PET/CT showed mild uptake in 2 lung nodules. Others were not hypermetabolic. No other evidence of metastatic disease  10. Repeat lung biopsy consistent with colon adenocarcinoma. Comprehensive RAS panel testing showed mutant KRAS  11.Given the slow rate of growth of his colon cancer and desire to maintain his quality of life plan was to start chemotherapy after he gets a 70-monthbreak in summer. Repeat CT chest abdomen and pelvis in August 2019 showed mild increase in the size of his lung nodules. No new lung nodules are no new sites of distant metastatic disease.FOLFOX Avastin chemotherapy started on 06/22/2018.Patient completed 12 cycles of FOLFOX Avastin chemotherapy with continued response to his lung lesions. Oxaliplatin was dropped and patient is currently on 5-FU Avastin   Interval history-neuropathy slightly worse because of cold weather.  He continues to take gabapentin 900 mg twice a day.  Denies other complaints at this time.  Mild self-limited diarrhea with taken for which he took over-the-counter Imodium  ECOG PS- 1 Pain scale- 0   Review of systems- Review of Systems  Constitutional: Negative for chills, fever, malaise/fatigue and weight loss.  HENT: Negative for congestion, ear discharge and nosebleeds.   Eyes: Negative for blurred vision.  Respiratory: Negative for cough, hemoptysis, sputum production, shortness of breath and wheezing.   Cardiovascular: Negative for chest pain, palpitations, orthopnea and claudication.  Gastrointestinal: Negative for abdominal pain, blood in stool, constipation, diarrhea, heartburn, melena, nausea and vomiting.  Genitourinary: Negative for dysuria, flank pain, frequency,  hematuria and urgency.  Musculoskeletal: Negative for back pain, joint pain and myalgias.  Skin: Negative for rash.  Neurological: Positive for sensory change (Peripheral neuropathy). Negative for dizziness, tingling, focal weakness, seizures, weakness and headaches.  Endo/Heme/Allergies: Does not bruise/bleed easily.  Psychiatric/Behavioral: Negative for depression and suicidal ideas. The patient does not have insomnia.        No Known Allergies   Past Medical History:  Diagnosis Date  . Cancer ([place])    skin cancer-nose w graft,also shoulder- basal cell per pt  . Cataract    r eye  . Chronic kidney disease    kidney stones 20 y ago per pt  . Colon cancer ([place]) 2018   Surgical resection and chemo tx's.  . Diabetes mellitus without complication ([place])   . Eczema   . History of kidney stones   . Hyperlipidemia   . Hypertension   . Vertigo      Past Surgical History:  Procedure Laterality Date  . CATARACT EXTRACTION Left   . COLECTOMY WITH COLOSTOMY CREATION/HARTMANN PROCEDURE N/A 02/06/2017   Procedure: COLECTOMY WITH COLOSTOMY CREATION/HARTMANN PROCEDURE;  Surgeon: [name], MD;  Location: [facility] ORS;  Service: General;  Laterality: N/A;  . COLON SURGERY    . COLONOSCOPY WITH PROPOFOL N/A 09/04/2017   Procedure: COLONOSCOPY WITH PROPOFOL;  Surgeon: Jonathon Bellows, MD;  Location: [facility] ENDOSCOPY;  Service: Gastroenterology;  Laterality: N/A;  . COLOSTOMY CLOSURE N/A 10/20/2017   Procedure: COLOSTOMY CLOSURE;  Surgeon: [name], MD;  Location: [facility] ORS;  Service: General;  Laterality: N/A;  . CYSTOSCOPY    . FLEXIBLE SIGMOIDOSCOPY N/A 02/06/2017   Procedure: FLEXIBLE SIGMOIDOSCOPY;  Surgeon: Christene Lye, MD;  Location: [facility] ENDOSCOPY;  Service: Endoscopy;  Laterality: N/A;  . PORTACATH PLACEMENT N/A 06/17/2018   Procedure: INSERTION PORT-A-CATH, WITH FLUOROSCOPY;  Surgeon: [name], MD;  Location: [facility] ORS;  Service: General;  Laterality: N/A;     Social History   Socioeconomic History  . Marital status: Married    Spouse name: Not on file  . Number of children: Not on file  . Years of education: Not on file  . Highest education level: Not on file  Occupational History  . Not on file  Social Needs  . Financial resource strain: Not on file  . Food insecurity    Worry: Not on file    Inability: Not on file  . Transportation needs    Medical: Not on file    Non-medical: Not on file  Tobacco Use  . Smoking status: Never Smoker  . Smokeless tobacco: Never Used  Substance and Sexual Activity  . Alcohol use: No  . Drug use: No  . Sexual activity: Not Currently  Lifestyle  . Physical activity    Days per week: Not on file    Minutes per session: Not on file  . Stress: Not on file  Relationships  . Social Herbalist on phone: Not on file    Gets together: Not on file    Attends religious service: Not on file    Active member of club or organization: Not on file    Attends meetings of clubs or organizations: Not on file    Relationship status: Not on file  . Intimate partner violence    Fear of current or ex partner: Not on file    Emotionally abused: Not on file    Physically abused: Not on file    Forced sexual activity:  Not on file  Other Topics Concern  . Not on file  Social History Narrative  . Not on file    Family History  Problem Relation Age of Onset  . Diabetes Mother   . AAA (abdominal aortic aneurysm) Mother   . Coronary artery disease Mother   . Diabetes Father   . Coronary artery disease Father   . Dementia Father   . Breast cancer Sister      Current Outpatient Medications:  .  acetaminophen (TYLENOL) 500 MG tablet, Take 500 mg by mouth every 6 (six) hours as needed (for pain.)., Disp: , Rfl:  .  aspirin EC 81 MG tablet, Take 81 mg by mouth daily., Disp: , Rfl:  .  Docusate Sodium (COLACE PO), Take 1 Dose by mouth as needed., Disp: , Rfl:  .  Dulaglutide 0.75 MG/0.5ML SOPN,  Inject into the skin once a week. , Disp: , Rfl:  .  gabapentin (NEURONTIN) 300 MG capsule, Take 3 capsules (900 mg total) by mouth 2 (two) times daily., Disp: 180 capsule, Rfl: 2 .  glimepiride (AMARYL) 2 MG tablet, Take 2 mg by mouth 2 (two) times daily before a meal. , Disp: , Rfl:  .  lidocaine-prilocaine (EMLA) cream, Place a small amount over cream over port 1 hour before each treatment, cover cream with saran wrap for clothing protection, Disp: 30 g, Rfl: 1 .  loratadine (CLARITIN) 10 MG tablet, Take 10 mg by mouth daily., Disp: , Rfl:  .  losartan (COZAAR) 25 MG tablet, Take 25 mg by mouth daily., Disp: , Rfl:  .  metFORMIN (GLUCOPHAGE) 1000 MG tablet, Take 1,000-1,500 tablets by mouth See admin instructions. TAKE 1 TABLET (1000 MG) BY MOUTH IN THE MORNING & TAKE 1.5 TABLETS (1500 MG) BY MOUTH AT SUPPER, Disp: , Rfl:  .  pravastatin (PRAVACHOL) 20 MG tablet, Take 20 mg by mouth daily with supper. , Disp: , Rfl:  .  triamcinolone (KENALOG) 0.025 % cream, Apply 1 application topically daily as needed (FOR EZCEMA)., Disp: 30 g, Rfl: 2 No current facility-administered medications for this visit.   Facility-Administered Medications Ordered in Other Visits:  .  0.9 %  sodium chloride infusion, , Intravenous, Continuous, Sindy Guadeloupe, MD, Stopped at 07/27/18 1037 .  fluorouracil (ADRUCIL) 4,800 mg in sodium chloride 0.9 % 54 mL chemo infusion, 2,400 mg/m2 (Treatment Plan Recorded), Intravenous, 1 day or 1 dose, Sindy Guadeloupe, MD, 4,800 mg at 08/30/19 1200 .  heparin lock flush 100 unit/mL, 500 Units, Intravenous, Once, Randa Evens C, MD .  sodium chloride flush (NS) 0.9 % injection 10 mL, 10 mL, Intravenous, PRN, Sindy Guadeloupe, MD, 10 mL at 07/27/18 0920 .  sodium chloride flush (NS) 0.9 % injection 10 mL, 10 mL, Intravenous, PRN, Sindy Guadeloupe, MD, 10 mL at 08/30/19 0815  Physical exam:  Vitals:   08/30/19 0839  BP: (!) 157/70  Pulse: 67  Resp: 16  Temp: (!) 96.3 F (35.7 C)   TempSrc: Tympanic  Weight: 192 lb 14.4 oz (87.5 kg)   Physical Exam Constitutional:      General: He is not in acute distress. HENT:     Head: Normocephalic and atraumatic.  Eyes:     Pupils: Pupils are equal, round, and reactive to light.  Neck:     Musculoskeletal: Normal range of motion.  Cardiovascular:     Rate and Rhythm: Normal rate and regular rhythm.     Heart sounds: Normal heart sounds.  Pulmonary:     Effort: Pulmonary effort is normal.     Breath sounds: Normal breath sounds.  Abdominal:     General: Bowel sounds are normal.     Palpations: Abdomen is soft.  Skin:    General: Skin is warm and dry.  Neurological:     Mental Status: He is alert and oriented to person, place, and time.      CMP Latest Ref Rng & Units 08/30/2019  Glucose 70 - 99 mg/dL 197(H)  BUN 8 - 23 mg/dL 14  Creatinine 0.61 - 1.24 mg/dL 1.13  Sodium 135 - 145 mmol/L 138  Potassium 3.5 - 5.1 mmol/L 3.9  Chloride 98 - 111 mmol/L 108  CO2 22 - 32 mmol/L 22  Calcium 8.9 - 10.3 mg/dL 9.1  Total Protein 6.5 - 8.1 g/dL 6.5  Total Bilirubin 0.3 - 1.2 mg/dL 0.4  Alkaline Phos 38 - 126 U/L 93  AST 15 - 41 U/L 20  ALT 0 - 44 U/L 17   CBC Latest Ref Rng & Units 08/30/2019  WBC 4.0 - 10.5 K/uL 8.9  Hemoglobin 13.0 - 17.0 g/dL 10.6(L)  Hematocrit 39.0 - 52.0 % 34.2(L)  Platelets 150 - 400 K/uL 124(L)    No images are attached to the encounter.  Ct Chest W Contrast  Result Date: 08/11/2019 CLINICAL DATA:  Metastatic colon cancer EXAM: CT CHEST, ABDOMEN, AND PELVIS WITH CONTRAST TECHNIQUE: Multidetector CT imaging of the chest, abdomen and pelvis was performed following the standard protocol during bolus administration of intravenous contrast. CONTRAST:  21m OMNIPAQUE IOHEXOL 300 MG/ML SOLN, additional oral enteric contrast COMPARISON:  04/26/2019, 12/13/2018, PET-CT, 03/19/2018 FINDINGS: CT CHEST FINDINGS Cardiovascular: Left chest port catheter. Aortic atherosclerosis. Normal heart size.  Extensive 3 vessel coronary artery calcifications. No pericardial effusion. Mediastinum/Nodes: No enlarged mediastinal, hilar, or axillary lymph nodes. Thyroid gland, trachea, and esophagus demonstrate no significant findings. Lungs/Pleura: Interval enlargement of a lobulated perihilar nodule of the left upper lobe measuring 1.7 x 1.5 cm, previously 1.4 x 1.1 cm (series 3, image 72). Interval enlargement of a left upper lobe nodule measuring 8 mm, previously 6 mm (series 3, image 58). Slight interval enlargement of a lobulated nodule of the lingula measuring 1.5 x 1.1 cm, previously 1.3 x 0.9 cm (series 3, image 79). Interval enlargement and increasingly solid appearance of a right lower lobe nodule measuring 8 mm, previously 6 mm (series 3, image 105). There is bandlike atelectasis of the left lung base with scattered adjacent small ground-glass opacities (series 3, image 14). No pleural effusion or pneumothorax. Musculoskeletal: No chest wall mass or suspicious bone lesions identified. CT ABDOMEN PELVIS FINDINGS Hepatobiliary: No solid liver abnormality is seen. Gallstones near the gallbladder neck. No gallbladder wall thickening, or biliary dilatation. Pancreas: Unremarkable. No pancreatic ductal dilatation or surrounding inflammatory changes. Spleen: Normal in size without significant abnormality. Adrenals/Urinary Tract: Adrenal glands are unremarkable. Tiny nonobstructive superior pole calculus of the right kidney. Bladder is unremarkable. Stomach/Bowel: Stomach is within normal limits. Appendix appears normal. No evidence of bowel wall thickening, distention, or inflammatory changes. Status post sigmoid colon resection and reanastomosis. Moderate burden of stool in the colon and rectum. Vascular/Lymphatic: Aortic atherosclerosis. No enlarged abdominal or pelvic lymph nodes. Reproductive: No mass or other abnormality. Other: No abdominal wall hernia or abnormality. No abdominopelvic ascites. Musculoskeletal: No  acute or significant osseous findings. IMPRESSION: 1. Interval enlargement of a lobulated perihilar nodule of the left upper lobe measuring 1.7 x 1.5 cm, previously 1.4 x 1.1  cm (series 3, image 72). Interval enlargement of a left upper lobe nodule measuring 8 mm, previously 6 mm (series 3, image 58). Slight interval enlargement of a lobulated nodule of the lingula measuring 1.5 x 1.1 cm, previously 1.3 x 0.9 cm (series 3, image 79). Interval enlargement and increasingly solid appearance of a right lower lobe nodule measuring 8 mm, previously 6 mm (series 3, image 105). Findings are consistent with enlarging pulmonary metastases. 2. No other evidence of metastatic disease in the chest, abdomen, or pelvis. 3. Redemonstrated postoperative findings of sigmoid colon resection and reanastomosis. 4. There is bandlike atelectasis of the left lung base with scattered adjacent small ground-glass opacities (series 3, image 14), nonspecific and infectious or inflammatory. Attention on follow-up. 5.  Coronary artery disease.  Aortic Atherosclerosis (ICD10-I70.0). 6.  Cholelithiasis.  Nonobstructive right nephrolithiasis. Electronically Signed   By: Eddie Candle M.D.   On: 08/11/2019 09:40   Ct Abdomen Pelvis W Contrast  Result Date: 08/11/2019 CLINICAL DATA:  Metastatic colon cancer EXAM: CT CHEST, ABDOMEN, AND PELVIS WITH CONTRAST TECHNIQUE: Multidetector CT imaging of the chest, abdomen and pelvis was performed following the standard protocol during bolus administration of intravenous contrast. CONTRAST:  21m OMNIPAQUE IOHEXOL 300 MG/ML SOLN, additional oral enteric contrast COMPARISON:  04/26/2019, 12/13/2018, PET-CT, 03/19/2018 FINDINGS: CT CHEST FINDINGS Cardiovascular: Left chest port catheter. Aortic atherosclerosis. Normal heart size. Extensive 3 vessel coronary artery calcifications. No pericardial effusion. Mediastinum/Nodes: No enlarged mediastinal, hilar, or axillary lymph nodes. Thyroid gland, trachea, and  esophagus demonstrate no significant findings. Lungs/Pleura: Interval enlargement of a lobulated perihilar nodule of the left upper lobe measuring 1.7 x 1.5 cm, previously 1.4 x 1.1 cm (series 3, image 72). Interval enlargement of a left upper lobe nodule measuring 8 mm, previously 6 mm (series 3, image 58). Slight interval enlargement of a lobulated nodule of the lingula measuring 1.5 x 1.1 cm, previously 1.3 x 0.9 cm (series 3, image 79). Interval enlargement and increasingly solid appearance of a right lower lobe nodule measuring 8 mm, previously 6 mm (series 3, image 105). There is bandlike atelectasis of the left lung base with scattered adjacent small ground-glass opacities (series 3, image 14). No pleural effusion or pneumothorax. Musculoskeletal: No chest wall mass or suspicious bone lesions identified. CT ABDOMEN PELVIS FINDINGS Hepatobiliary: No solid liver abnormality is seen. Gallstones near the gallbladder neck. No gallbladder wall thickening, or biliary dilatation. Pancreas: Unremarkable. No pancreatic ductal dilatation or surrounding inflammatory changes. Spleen: Normal in size without significant abnormality. Adrenals/Urinary Tract: Adrenal glands are unremarkable. Tiny nonobstructive superior pole calculus of the right kidney. Bladder is unremarkable. Stomach/Bowel: Stomach is within normal limits. Appendix appears normal. No evidence of bowel wall thickening, distention, or inflammatory changes. Status post sigmoid colon resection and reanastomosis. Moderate burden of stool in the colon and rectum. Vascular/Lymphatic: Aortic atherosclerosis. No enlarged abdominal or pelvic lymph nodes. Reproductive: No mass or other abnormality. Other: No abdominal wall hernia or abnormality. No abdominopelvic ascites. Musculoskeletal: No acute or significant osseous findings. IMPRESSION: 1. Interval enlargement of a lobulated perihilar nodule of the left upper lobe measuring 1.7 x 1.5 cm, previously 1.4 x 1.1 cm  (series 3, image 72). Interval enlargement of a left upper lobe nodule measuring 8 mm, previously 6 mm (series 3, image 58). Slight interval enlargement of a lobulated nodule of the lingula measuring 1.5 x 1.1 cm, previously 1.3 x 0.9 cm (series 3, image 79). Interval enlargement and increasingly solid appearance of a right lower lobe nodule measuring 8  mm, previously 6 mm (series 3, image 105). Findings are consistent with enlarging pulmonary metastases. 2. No other evidence of metastatic disease in the chest, abdomen, or pelvis. 3. Redemonstrated postoperative findings of sigmoid colon resection and reanastomosis. 4. There is bandlike atelectasis of the left lung base with scattered adjacent small ground-glass opacities (series 3, image 14), nonspecific and infectious or inflammatory. Attention on follow-up. 5.  Coronary artery disease.  Aortic Atherosclerosis (ICD10-I70.0). 6.  Cholelithiasis.  Nonobstructive right nephrolithiasis. Electronically Signed   By: Eddie Candle M.D.   On: 08/11/2019 09:40     Assessment and plan- Patient is a 78 y.o. male with stage IV colon cancer and lung metastases.  He is here for on treatment assessment prior to cycle 2 of FOLFIRI Avastin chemotherapy.  He had progression on FOLFOX Avastin chemotherapy  Patient tolerated cycle 1 of FOLFIRI Avastin chemotherapy well without any significant side effects.  Counts are okay to proceed with cycle 2 of FOLFIRI Avastin chemotherapy today.  He will come back on day 3 for pump disconnect.  He will not receive Udenyca with this cycle.  I will see him back in 2 weeks time with CBC with differential, CMP for cycle 3 of FOLFIRI Avastin chemotherapy.  Blood pressure is stable and urine protein is trace.  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  I have asked him to increase up to 900 mg 3 times daily if he can tolerate and remember the third dose.   Visit Diagnosis 1. 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[place])    2. Encounter for antineoplastic chemotherapy   3. Encounter for monoclonal antibody treatment for malignancy   4. Chemotherapy-induced peripheral neuropathy ([place])      Dr. Randa Evens, MD, MPH [facility] at [facility] [phone] 08/30/2019 2:19 PM

## 2019-08-30 NOTE — Progress Notes (Signed)
OK to proceed with tx per Dr [name]. Noah Charon not ready at time when Irinotecan and Leucovorin was ready, ok to proceed with this initially and follow with Noah Charon per Debbora Lacrosse, pharmacist.

## 2019-09-01 ENCOUNTER — Inpatient Hospital Stay: Payer: Medicare Other

## 2019-09-01 ENCOUNTER — Other Ambulatory Visit: Payer: Self-pay

## 2019-09-01 VITALS — BP 136/70 | HR 75 | Temp 97.8°F | Resp 18

## 2019-09-01 DIAGNOSIS — C7800 Secondary malignant neoplasm of unspecified lung: Secondary | ICD-10-CM

## 2019-09-01 DIAGNOSIS — C189 Malignant neoplasm of colon, unspecified: Secondary | ICD-10-CM

## 2019-09-01 DIAGNOSIS — Z5111 Encounter for antineoplastic chemotherapy: Secondary | ICD-10-CM | POA: Diagnosis not present

## 2019-09-01 MED ORDER — HEPARIN SOD (PORK) LOCK FLUSH 100 UNIT/ML IV SOLN
500.0000 [IU] | Freq: Once | INTRAVENOUS | Status: AC | PRN
  Administered 2019-09-01: 500 [IU]

## 2019-09-07 ENCOUNTER — Telehealth: Payer: Self-pay | Admitting: *Deleted

## 2019-09-07 NOTE — Telephone Encounter (Signed)
Pt called wanting to know if his hair can fall out with the new regimen. I called him back and said that with irinotecan that his hair can thin or come out all together. He states when he uses towel to dry it that he sees a lot of hair on towel.  He will continue to monitor it and may need a hat he says.

## 2019-09-12 NOTE — Progress Notes (Signed)
Patient is coming in for follow up he is doing well he mentions his hair is falling out.

## 2019-09-13 ENCOUNTER — Inpatient Hospital Stay: Payer: Medicare Other | Attending: Oncology

## 2019-09-13 ENCOUNTER — Inpatient Hospital Stay: Payer: Medicare Other

## 2019-09-13 ENCOUNTER — Other Ambulatory Visit: Payer: Self-pay

## 2019-09-13 ENCOUNTER — Inpatient Hospital Stay (HOSPITAL_BASED_OUTPATIENT_CLINIC_OR_DEPARTMENT_OTHER): Payer: Medicare Other | Admitting: Oncology

## 2019-09-13 VITALS — BP 157/67 | HR 69 | Temp 97.6°F | Resp 16 | Ht 66.0 in | Wt 189.9 lb

## 2019-09-13 DIAGNOSIS — Z5111 Encounter for antineoplastic chemotherapy: Secondary | ICD-10-CM | POA: Insufficient documentation

## 2019-09-13 DIAGNOSIS — D6481 Anemia due to antineoplastic chemotherapy: Secondary | ICD-10-CM | POA: Diagnosis not present

## 2019-09-13 DIAGNOSIS — C7800 Secondary malignant neoplasm of unspecified lung: Secondary | ICD-10-CM | POA: Insufficient documentation

## 2019-09-13 DIAGNOSIS — D6959 Other secondary thrombocytopenia: Secondary | ICD-10-CM | POA: Diagnosis not present

## 2019-09-13 DIAGNOSIS — Z5189 Encounter for other specified aftercare: Secondary | ICD-10-CM | POA: Diagnosis not present

## 2019-09-13 DIAGNOSIS — C189 Malignant neoplasm of colon, unspecified: Secondary | ICD-10-CM

## 2019-09-13 DIAGNOSIS — C187 Malignant neoplasm of sigmoid colon: Secondary | ICD-10-CM | POA: Diagnosis present

## 2019-09-13 DIAGNOSIS — T451X5A Adverse effect of antineoplastic and immunosuppressive drugs, initial encounter: Secondary | ICD-10-CM

## 2019-09-13 DIAGNOSIS — I10 Essential (primary) hypertension: Secondary | ICD-10-CM | POA: Diagnosis not present

## 2019-09-13 LAB — CBC WITH DIFFERENTIAL/PLATELET
Abs Immature Granulocytes: 0 10*3/uL (ref 0.00–0.07)
Basophils Absolute: 0.1 10*3/uL (ref 0.0–0.1)
Basophils Relative: 2 %
Eosinophils Absolute: 0.1 10*3/uL (ref 0.0–0.5)
Eosinophils Relative: 4 %
HCT: 34.1 % — ABNORMAL LOW (ref 39.0–52.0)
Hemoglobin: 10.6 g/dL — ABNORMAL LOW (ref 13.0–17.0)
Immature Granulocytes: 0 %
Lymphocytes Relative: 43 %
Lymphs Abs: 1.4 10*3/uL (ref 0.7–4.0)
MCH: 29.6 pg (ref 26.0–34.0)
MCHC: 31.1 g/dL (ref 30.0–36.0)
MCV: 95.3 fL (ref 80.0–100.0)
Monocytes Absolute: 0.4 10*3/uL (ref 0.1–1.0)
Monocytes Relative: 11 %
Neutro Abs: 1.2 10*3/uL — ABNORMAL LOW (ref 1.7–7.7)
Neutrophils Relative %: 40 %
Platelets: 140 10*3/uL — ABNORMAL LOW (ref 150–400)
RBC: 3.58 MIL/uL — ABNORMAL LOW (ref 4.22–5.81)
RDW: 15.7 % — ABNORMAL HIGH (ref 11.5–15.5)
WBC: 3.1 10*3/uL — ABNORMAL LOW (ref 4.0–10.5)
nRBC: 0 % (ref 0.0–0.2)

## 2019-09-13 LAB — COMPREHENSIVE METABOLIC PANEL
ALT: 19 U/L (ref 0–44)
AST: 22 U/L (ref 15–41)
Albumin: 3.9 g/dL (ref 3.5–5.0)
Alkaline Phosphatase: 63 U/L (ref 38–126)
Anion gap: 11 (ref 5–15)
BUN: 18 mg/dL (ref 8–23)
CO2: 21 mmol/L — ABNORMAL LOW (ref 22–32)
Calcium: 9.3 mg/dL (ref 8.9–10.3)
Chloride: 106 mmol/L (ref 98–111)
Creatinine, Ser: 1.3 mg/dL — ABNORMAL HIGH (ref 0.61–1.24)
GFR calc Af Amer: >60 mL/min (ref >60)
GFR calc non Af Amer: 52 mL/min — ABNORMAL LOW (ref >60)
Glucose, Bld: 175 mg/dL — ABNORMAL HIGH (ref 70–99)
Potassium: 4.3 mmol/L (ref 3.5–5.1)
Sodium: 138 mmol/L (ref 135–145)
Total Bilirubin: 0.6 mg/dL (ref 0.3–1.2)
Total Protein: 6.7 g/dL (ref 6.5–8.1)

## 2019-09-13 LAB — PROTEIN, URINE, RANDOM: Total Protein, Urine: 25 mg/dL

## 2019-09-13 MED ORDER — LEUCOVORIN CALCIUM INJECTION 350 MG
402.0000 mg/m2 | Freq: Once | INTRAVENOUS | Status: AC
  Administered 2019-09-13: 800 mg via INTRAVENOUS
  Filled 2019-09-13: qty 40

## 2019-09-13 MED ORDER — SODIUM CHLORIDE 0.9 % IV SOLN
Freq: Once | INTRAVENOUS | Status: AC
  Administered 2019-09-13: 09:00:00 via INTRAVENOUS
  Filled 2019-09-13: qty 250

## 2019-09-13 MED ORDER — IRINOTECAN HCL CHEMO INJECTION 100 MG/5ML: 150.0000 mg/m2 | Freq: Once | INTRAVENOUS | Status: DC

## 2019-09-13 MED ORDER — ATROPINE SULFATE 1 MG/ML IJ SOLN
0.5000 mg | Freq: Once | INTRAMUSCULAR | Status: AC | PRN
  Administered 2019-09-13: 10:00:00 0.5 mg via INTRAVENOUS
  Filled 2019-09-13: qty 1

## 2019-09-13 MED ORDER — SODIUM CHLORIDE 0.9 % IV SOLN
5.0000 mg/kg | Freq: Once | INTRAVENOUS | Status: AC
  Administered 2019-09-13: 400 mg via INTRAVENOUS
  Filled 2019-09-13: qty 16

## 2019-09-13 MED ORDER — PALONOSETRON HCL INJECTION 0.25 MG/5ML
0.2500 mg | Freq: Once | INTRAVENOUS | Status: AC
  Administered 2019-09-13: 0.25 mg via INTRAVENOUS
  Filled 2019-09-13: qty 5

## 2019-09-13 MED ORDER — SODIUM CHLORIDE 0.9% FLUSH
10.0000 mL | INTRAVENOUS | Status: DC | PRN
  Administered 2019-09-13: 10 mL via INTRAVENOUS
  Filled 2019-09-13: qty 10

## 2019-09-13 MED ORDER — SODIUM CHLORIDE 0.9 % IV SOLN
2400.0000 mg/m2 | INTRAVENOUS | Status: DC
  Administered 2019-09-13: 4800 mg via INTRAVENOUS
  Filled 2019-09-13: qty 96

## 2019-09-13 MED ORDER — DEXAMETHASONE SODIUM PHOSPHATE 10 MG/ML IJ SOLN
10.0000 mg | Freq: Once | INTRAMUSCULAR | Status: AC
  Administered 2019-09-13: 10 mg via INTRAVENOUS
  Filled 2019-09-13: qty 1

## 2019-09-13 MED ORDER — IRINOTECAN HCL CHEMO INJECTION 100 MG/5ML
150.0000 mg/m2 | Freq: Once | INTRAVENOUS | Status: AC
  Administered 2019-09-13: 300 mg via INTRAVENOUS
  Filled 2019-09-13: qty 15

## 2019-09-13 MED ORDER — FLUOROURACIL CHEMO INJECTION 2.5 GM/50ML
400.0000 mg/m2 | Freq: Once | INTRAVENOUS | Status: AC
  Administered 2019-09-13: 800 mg via INTRAVENOUS
  Filled 2019-09-13: qty 16

## 2019-09-13 NOTE — Progress Notes (Signed)
ANC 1.2 today. Per Dr [name] okay to proceed with tx today

## 2019-09-15 ENCOUNTER — Encounter: Payer: Self-pay | Admitting: Oncology

## 2019-09-15 ENCOUNTER — Other Ambulatory Visit: Payer: Self-pay

## 2019-09-15 ENCOUNTER — Inpatient Hospital Stay: Payer: Medicare Other

## 2019-09-15 VITALS — BP 147/70 | HR 69 | Temp 96.4°F | Resp 20

## 2019-09-15 DIAGNOSIS — C7800 Secondary malignant neoplasm of unspecified lung: Secondary | ICD-10-CM

## 2019-09-15 DIAGNOSIS — Z5111 Encounter for antineoplastic chemotherapy: Secondary | ICD-10-CM | POA: Diagnosis not present

## 2019-09-15 DIAGNOSIS — C189 Malignant neoplasm of colon, unspecified: Secondary | ICD-10-CM

## 2019-09-15 MED ORDER — SODIUM CHLORIDE 0.9% FLUSH
10.0000 mL | INTRAVENOUS | Status: DC | PRN
  Administered 2019-09-15: 10 mL
  Filled 2019-09-15: qty 10

## 2019-09-15 MED ORDER — PEGFILGRASTIM-CBQV 6 MG/0.6ML ~~LOC~~ SOSY
6.0000 mg | PREFILLED_SYRINGE | Freq: Once | SUBCUTANEOUS | Status: AC
  Administered 2019-09-15: 6 mg via SUBCUTANEOUS
  Filled 2019-09-15: qty 0.6

## 2019-09-15 MED ORDER — HEPARIN SOD (PORK) LOCK FLUSH 100 UNIT/ML IV SOLN
500.0000 [IU] | Freq: Once | INTRAVENOUS | Status: AC | PRN
  Administered 2019-09-15: 11:00:00 500 [IU]
  Filled 2019-09-15: qty 5

## 2019-09-15 NOTE — Progress Notes (Signed)
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 Patient is a 78 year old male who presented with evidence of bowel obstruction on 02/06/2017. At that time he had progressive abdominal distention and no bowel movement for about one week. CT abdomen showed an apple core lesion in the descending/sigmoid colon.  2. Patient underwent Hartmann's procedure for obstructing sigmoid colon mass on 02/06/2017. Preoperative CEA was 5.0  3. 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 It is 0 out of 15 lymph nodes were positive for malignancy. Perineural and lymphovascular invasion was present. Margins were negative.pT3N0. MMR stable.  4. Given that he had high risk stage II colon cancer including he presented with obstruction and has LVI and perineural invasion- adjuvant xeloda chemotherapy was recommended for 6 months.   5.Patient had evidence of hand foot syndrome after cycle 3. Cycle 4 delayed by 1 week. Topical urea cream prescribed  6. Patient completed 8 cycles of adjuvant xeloda in October 2018. polps seen in sigmoid and decending colon but negative for malignancy  7. Repeat Ct abdomen after 8 cycles showed no metastatic disease. 6 mm lung nodule noted in RLL  8.Patient had a repeat colonoscopy in October 2018 which showed polyps in his transverse and descending colon which were taken out and were negative for  high-grade dysplasia or malignancy. Patient subsequently underwent colostomy takedown procedure by Dr. Burt Knack  9. Repeat CT chest abdomen/ pelvis showed increase in the size of previously seen lung nodules largest being 1.4 cm consistent with metastatic disease. PET/CT showed mild uptake in 2 lung nodules. Others were not hypermetabolic. No other evidence of metastatic disease  10. Repeat lung biopsy consistent with colon adenocarcinoma. Comprehensive RAS panel testing showed mutant KRAS  11.Given the slow rate of growth of his colon cancer and desire to maintain his quality of life plan was to start chemotherapy after he gets a 41-monthbreak in summer. Repeat CT chest abdomen and pelvis in August 2019 showed mild increase in the size of his lung nodules. No new lung nodules are no new sites of distant metastatic disease.FOLFOX Avastin chemotherapy started on 06/22/2018.Patient completed 12 cycles of FOLFOX Avastin chemotherapy with continued response to his lung lesions. Oxaliplatin was dropped and patient is currently on 5-FU Avastin   Interval history-patient has mild self-limited diarrhea and rarely uses Imodium.  He has noticed his hair is thinning out more.  Denies any fatigue.  Peripheral neuropathy stable  ECOG PS- 1 Pain scale- 0 Opioid associated constipation- no  Review of systems- Review of Systems  Constitutional: Negative for chills, fever, malaise/fatigue and weight loss.  HENT: Negative for congestion, ear discharge and nosebleeds.   Eyes: Negative for blurred vision.  Respiratory: Negative for cough, hemoptysis, sputum production, shortness of breath and wheezing.   Cardiovascular: Negative for chest pain, palpitations, orthopnea and claudication.  Gastrointestinal: Negative for abdominal pain, blood in stool, constipation, diarrhea, heartburn, melena, nausea and vomiting.  Genitourinary: Negative for dysuria, flank pain, frequency, hematuria and urgency.   Musculoskeletal: Negative for  back pain, joint pain and myalgias.  Skin: Negative for rash.  Neurological: Negative for dizziness, tingling, focal weakness, seizures, weakness and headaches.  Endo/Heme/Allergies: Does not bruise/bleed easily.  Psychiatric/Behavioral: Negative for depression and suicidal ideas. The patient does not have insomnia.        No Known Allergies   Past Medical History:  Diagnosis Date  . Cancer ([place])    skin cancer-nose w graft,also shoulder- basal cell per pt  . Cataract    r eye  . Chronic kidney disease    kidney stones 20 y ago per pt  . Colon cancer ([place]) 2018   Surgical resection and chemo tx's.  . Diabetes mellitus without complication ([place])   . Eczema   . History of kidney stones   . Hyperlipidemia   . Hypertension   . Vertigo      Past Surgical History:  Procedure Laterality Date  . CATARACT EXTRACTION Left   . COLECTOMY WITH COLOSTOMY CREATION/HARTMANN PROCEDURE N/A 02/06/2017   Procedure: COLECTOMY WITH COLOSTOMY CREATION/HARTMANN PROCEDURE;  Surgeon: [name], MD;  Location: [facility] ORS;  Service: General;  Laterality: N/A;  . COLON SURGERY    . COLONOSCOPY WITH PROPOFOL N/A 09/04/2017   Procedure: COLONOSCOPY WITH PROPOFOL;  Surgeon: Jonathon Bellows, MD;  Location: [facility] ENDOSCOPY;  Service: Gastroenterology;  Laterality: N/A;  . COLOSTOMY CLOSURE N/A 10/20/2017   Procedure: COLOSTOMY CLOSURE;  Surgeon: [name], MD;  Location: [facility] ORS;  Service: General;  Laterality: N/A;  . CYSTOSCOPY    . FLEXIBLE SIGMOIDOSCOPY N/A 02/06/2017   Procedure: FLEXIBLE SIGMOIDOSCOPY;  Surgeon: Christene Lye, MD;  Location: [facility] ENDOSCOPY;  Service: Endoscopy;  Laterality: N/A;  . PORTACATH PLACEMENT N/A 06/17/2018   Procedure: INSERTION PORT-A-CATH, WITH FLUOROSCOPY;  Surgeon: [name], MD;  Location: [facility] ORS;  Service: General;  Laterality: N/A;    Social History   Socioeconomic History  . Marital status: Married     Spouse name: Not on file  . Number of children: Not on file  . Years of education: Not on file  . Highest education level: Not on file  Occupational History  . Not on file  Social Needs  . Financial resource strain: Not on file  . Food insecurity    Worry: Not on file    Inability: Not on file  . Transportation needs    Medical: Not on file    Non-medical: Not on file  Tobacco Use  . Smoking status: Never Smoker  . Smokeless tobacco: Never Used  Substance and Sexual Activity  . Alcohol use: No  . Drug use: No  . Sexual activity: Not Currently  Lifestyle  . Physical activity    Days per week: Not on file    Minutes per session: Not on file  . Stress: Not on file  Relationships  . Social Herbalist on phone: Not on file    Gets together: Not on file    Attends religious service: Not on file    Active member of club or organization: Not on file    Attends meetings of clubs or organizations: Not on file    Relationship status: Not on file  . Intimate partner violence    Fear of current or ex partner: Not on file    Emotionally abused: Not on file    Physically abused: Not on file    Forced sexual activity: Not on file  Other Topics Concern  . Not on file  Social History Narrative  . Not on file    Family History  Problem Relation Age of Onset  . Diabetes Mother   . AAA (abdominal aortic aneurysm) Mother   . Coronary artery disease Mother   . Diabetes Father   . Coronary artery disease Father   . Dementia Father   . Breast cancer Sister      Current Outpatient Medications:  .  acetaminophen (TYLENOL) 500 MG tablet, Take 500 mg by mouth every 6 (six) hours as needed (for pain.)., Disp: , Rfl:  .  aspirin EC 81 MG tablet, Take 81 mg by mouth daily., Disp: , Rfl:  .  Docusate Sodium (COLACE PO), Take 1 Dose by mouth as needed., Disp: , Rfl:  .  Dulaglutide 0.75 MG/0.5ML SOPN, Inject into the skin once a week. , Disp: , Rfl:  .  gabapentin (NEURONTIN)  300 MG capsule, Take 3 capsules (900 mg total) by mouth 2 (two) times daily., Disp: 180 capsule, Rfl: 2 .  glimepiride (AMARYL) 2 MG tablet, Take 2 mg by mouth 2 (two) times daily before a meal. , Disp: , Rfl:  .  lidocaine-prilocaine (EMLA) cream, Place a small amount over cream over port 1 hour before each treatment, cover cream with saran wrap for clothing protection, Disp: 30 g, Rfl: 1 .  loratadine (CLARITIN) 10 MG tablet, Take 10 mg by mouth daily., Disp: , Rfl:  .  losartan (COZAAR) 25 MG tablet, Take 25 mg by mouth daily., Disp: , Rfl:  .  metFORMIN (GLUCOPHAGE) 1000 MG tablet, Take 1,000-1,500 tablets by mouth See admin instructions. TAKE 1 TABLET (1000 MG) BY MOUTH IN THE MORNING & TAKE 1.5 TABLETS (1500 MG) BY MOUTH AT SUPPER, Disp: , Rfl:  .  pravastatin (PRAVACHOL) 20 MG tablet, Take 20 mg by mouth daily with supper. , Disp: , Rfl:  .  triamcinolone (KENALOG) 0.025 % cream, Apply 1 application topically daily as needed (FOR EZCEMA)., Disp: 30 g, Rfl: 2 No current facility-administered medications for this visit.   Facility-Administered Medications Ordered in Other Visits:  .  0.9 %  sodium chloride infusion, , Intravenous, Continuous, Sindy Guadeloupe, MD, Stopped at 07/27/18 1037 .  heparin lock flush 100 unit/mL, 500 Units, Intravenous, Once, Sindy Guadeloupe, MD .  sodium chloride flush (NS) 0.9 % injection 10 mL, 10 mL, Intravenous, PRN, Sindy Guadeloupe, MD, 10 mL at 07/27/18 0920  Physical exam:  Vitals:   09/13/19 0837  BP: (!) 157/67  Pulse: 69  Resp: 16  Temp: 97.6 F (36.4 C)  TempSrc: Tympanic  Weight: 189 lb 14.4 oz (86.1 kg)  Height: '5\' 6"'  (1.676 m)   Physical Exam Constitutional:      General: He is not in acute distress. HENT:     Head: Normocephalic and atraumatic.  Eyes:     Pupils: Pupils are equal, round, and reactive to light.  Neck:     Musculoskeletal: Normal range of motion.  Cardiovascular:     Rate and Rhythm: Normal rate and regular rhythm.      Heart sounds: Normal heart sounds.  Pulmonary:     Effort: Pulmonary effort is normal.     Breath sounds: Normal breath sounds.  Abdominal:     General: Bowel sounds are normal.     Palpations: Abdomen is soft.  Skin:    General: Skin is warm and dry.  Neurological:     Mental Status: He is alert and oriented to person, place, and time.  CMP Latest Ref Rng & Units 09/13/2019  Glucose 70 - 99 mg/dL 175(H)  BUN 8 - 23 mg/dL 18  Creatinine 0.61 - 1.24 mg/dL 1.30(H)  Sodium 135 - 145 mmol/L 138  Potassium 3.5 - 5.1 mmol/L 4.3  Chloride 98 - 111 mmol/L 106  CO2 22 - 32 mmol/L 21(L)  Calcium 8.9 - 10.3 mg/dL 9.3  Total Protein 6.5 - 8.1 g/dL 6.7  Total Bilirubin 0.3 - 1.2 mg/dL 0.6  Alkaline Phos 38 - 126 U/L 63  AST 15 - 41 U/L 22  ALT 0 - 44 U/L 19   CBC Latest Ref Rng & Units 09/13/2019  WBC 4.0 - 10.5 K/uL 3.1(L)  Hemoglobin 13.0 - 17.0 g/dL 10.6(L)  Hematocrit 39.0 - 52.0 % 34.1(L)  Platelets 150 - 400 K/uL 140(L)      Assessment and plan- Patient is a 78 y.o. male with stage IV colon cancer and lung metastases.  He has had progression on FOLFOX Avastin chemotherapy and here for on treatment assessment prior to second line cycle 3 FOLFIRI Avastin chemotherapy  Counts okay to proceed with cycle 3 of FOLFIRI Avastin chemotherapy today.  His mild leukopenia with a white count of 3.1 but his ANC is 1.2.  He will come back on day 3 for pump disconnect and receive Udenyca on that day.  Chemo-induced anemia: Hemoglobin is slowly drifting down from 12-10.6.  Continue to monitor I will check ferritin iron studies B12 and folate at next visit.  Chemo-induced thrombocytopenia: Mild continue to monitor  Chemo-induced peripheral neuropathy: Currently on gabapentin 900 mg 3 times daily which she is tolerating well and symptoms are stable.   Visit Diagnosis 1. 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[place])   2. Encounter for antineoplastic chemotherapy   3. Antineoplastic chemotherapy induced anemia    4. Chemotherapy-induced thrombocytopenia      Dr. Randa Evens, MD, MPH [facility] [phone] 09/15/2019 8:10 AM

## 2019-09-26 ENCOUNTER — Other Ambulatory Visit: Payer: Self-pay

## 2019-09-26 ENCOUNTER — Encounter: Payer: Self-pay | Admitting: Oncology

## 2019-09-26 ENCOUNTER — Other Ambulatory Visit: Payer: Self-pay | Admitting: *Deleted

## 2019-09-26 DIAGNOSIS — C7800 Secondary malignant neoplasm of unspecified lung: Secondary | ICD-10-CM

## 2019-09-26 NOTE — Progress Notes (Signed)
Patient stated that he had been doing well. Patient stated that occassionally he will have diarrhea.

## 2019-09-27 ENCOUNTER — Inpatient Hospital Stay: Payer: Medicare Other

## 2019-09-27 ENCOUNTER — Other Ambulatory Visit: Payer: Self-pay

## 2019-09-27 ENCOUNTER — Inpatient Hospital Stay (HOSPITAL_BASED_OUTPATIENT_CLINIC_OR_DEPARTMENT_OTHER): Payer: Medicare Other | Admitting: Oncology

## 2019-09-27 VITALS — BP 140/63 | HR 75 | Temp 97.1°F | Resp 16 | Ht 66.0 in | Wt 186.7 lb

## 2019-09-27 DIAGNOSIS — C187 Malignant neoplasm of sigmoid colon: Secondary | ICD-10-CM | POA: Diagnosis not present

## 2019-09-27 DIAGNOSIS — Z5112 Encounter for antineoplastic immunotherapy: Secondary | ICD-10-CM

## 2019-09-27 DIAGNOSIS — T451X5A Adverse effect of antineoplastic and immunosuppressive drugs, initial encounter: Secondary | ICD-10-CM

## 2019-09-27 DIAGNOSIS — C7800 Secondary malignant neoplasm of unspecified lung: Secondary | ICD-10-CM

## 2019-09-27 DIAGNOSIS — Z5111 Encounter for antineoplastic chemotherapy: Secondary | ICD-10-CM

## 2019-09-27 DIAGNOSIS — C189 Malignant neoplasm of colon, unspecified: Secondary | ICD-10-CM

## 2019-09-27 DIAGNOSIS — D6959 Other secondary thrombocytopenia: Secondary | ICD-10-CM

## 2019-09-27 DIAGNOSIS — D6481 Anemia due to antineoplastic chemotherapy: Secondary | ICD-10-CM

## 2019-09-27 DIAGNOSIS — G620 Drug-induced polyneuropathy: Secondary | ICD-10-CM

## 2019-09-27 LAB — COMPREHENSIVE METABOLIC PANEL
ALT: 23 U/L (ref 0–44)
AST: 31 U/L (ref 15–41)
Albumin: 4 g/dL (ref 3.5–5.0)
Alkaline Phosphatase: 89 U/L (ref 38–126)
Anion gap: 9 (ref 5–15)
BUN: 14 mg/dL (ref 8–23)
CO2: 21 mmol/L — ABNORMAL LOW (ref 22–32)
Calcium: 8.7 mg/dL — ABNORMAL LOW (ref 8.9–10.3)
Chloride: 105 mmol/L (ref 98–111)
Creatinine, Ser: 1.13 mg/dL (ref 0.61–1.24)
GFR calc Af Amer: >60 mL/min (ref >60)
GFR calc non Af Amer: >60 mL/min (ref >60)
Glucose, Bld: 213 mg/dL — ABNORMAL HIGH (ref 70–99)
Potassium: 3.9 mmol/L (ref 3.5–5.1)
Sodium: 135 mmol/L (ref 135–145)
Total Bilirubin: 0.5 mg/dL (ref 0.3–1.2)
Total Protein: 6.9 g/dL (ref 6.5–8.1)

## 2019-09-27 LAB — CBC WITH DIFFERENTIAL/PLATELET
Abs Immature Granulocytes: 0.54 10*3/uL — ABNORMAL HIGH (ref 0.00–0.07)
Basophils Absolute: 0.1 10*3/uL (ref 0.0–0.1)
Basophils Relative: 1 %
Eosinophils Absolute: 0.2 10*3/uL (ref 0.0–0.5)
Eosinophils Relative: 1 %
HCT: 37.2 % — ABNORMAL LOW (ref 39.0–52.0)
Hemoglobin: 11.5 g/dL — ABNORMAL LOW (ref 13.0–17.0)
Immature Granulocytes: 5 %
Lymphocytes Relative: 21 %
Lymphs Abs: 2.3 10*3/uL (ref 0.7–4.0)
MCH: 29.3 pg (ref 26.0–34.0)
MCHC: 30.9 g/dL (ref 30.0–36.0)
MCV: 94.7 fL (ref 80.0–100.0)
Monocytes Absolute: 0.6 10*3/uL (ref 0.1–1.0)
Monocytes Relative: 6 %
Neutro Abs: 7.3 10*3/uL (ref 1.7–7.7)
Neutrophils Relative %: 66 %
Platelets: 137 10*3/uL — ABNORMAL LOW (ref 150–400)
RBC: 3.93 MIL/uL — ABNORMAL LOW (ref 4.22–5.81)
RDW: 16 % — ABNORMAL HIGH (ref 11.5–15.5)
WBC: 11.1 10*3/uL — ABNORMAL HIGH (ref 4.0–10.5)
nRBC: 0.3 % — ABNORMAL HIGH (ref 0.0–0.2)

## 2019-09-27 LAB — PROTEIN, URINE, RANDOM: Total Protein, Urine: 35 mg/dL

## 2019-09-27 MED ORDER — IRINOTECAN HCL CHEMO INJECTION 100 MG/5ML
150.0000 mg/m2 | Freq: Once | INTRAVENOUS | Status: AC
  Administered 2019-09-27: 300 mg via INTRAVENOUS
  Filled 2019-09-27: qty 15

## 2019-09-27 MED ORDER — SODIUM CHLORIDE 0.9% FLUSH
10.0000 mL | INTRAVENOUS | Status: DC | PRN
  Filled 2019-09-27: qty 10

## 2019-09-27 MED ORDER — ATROPINE SULFATE 1 MG/ML IJ SOLN
0.5000 mg | Freq: Once | INTRAMUSCULAR | Status: AC | PRN
  Administered 2019-09-27: 0.5 mg via INTRAVENOUS
  Filled 2019-09-27: qty 1

## 2019-09-27 MED ORDER — SODIUM CHLORIDE 0.9 % IV SOLN
Freq: Once | INTRAVENOUS | Status: AC
  Administered 2019-09-27: 10:00:00 via INTRAVENOUS
  Filled 2019-09-27: qty 250

## 2019-09-27 MED ORDER — SODIUM CHLORIDE 0.9% FLUSH
10.0000 mL | Freq: Once | INTRAVENOUS | Status: AC
  Administered 2019-09-27: 10 mL via INTRAVENOUS
  Filled 2019-09-27: qty 10

## 2019-09-27 MED ORDER — DEXAMETHASONE SODIUM PHOSPHATE 10 MG/ML IJ SOLN
10.0000 mg | Freq: Once | INTRAMUSCULAR | Status: AC
  Administered 2019-09-27: 10 mg via INTRAVENOUS
  Filled 2019-09-27: qty 1

## 2019-09-27 MED ORDER — SODIUM CHLORIDE 0.9 % IV SOLN
2400.0000 mg/m2 | INTRAVENOUS | Status: DC
  Administered 2019-09-27: 4800 mg via INTRAVENOUS
  Filled 2019-09-27: qty 96

## 2019-09-27 MED ORDER — PALONOSETRON HCL INJECTION 0.25 MG/5ML
0.2500 mg | Freq: Once | INTRAVENOUS | Status: AC
  Administered 2019-09-27: 0.25 mg via INTRAVENOUS
  Filled 2019-09-27: qty 5

## 2019-09-27 MED ORDER — SODIUM CHLORIDE 0.9 % IV SOLN
5.0000 mg/kg | Freq: Once | INTRAVENOUS | Status: AC
  Administered 2019-09-27: 400 mg via INTRAVENOUS
  Filled 2019-09-27: qty 16

## 2019-09-27 MED ORDER — LEUCOVORIN CALCIUM INJECTION 350 MG
402.0000 mg/m2 | Freq: Once | INTRAVENOUS | Status: AC
  Administered 2019-09-27: 800 mg via INTRAVENOUS
  Filled 2019-09-27: qty 17.5

## 2019-09-27 MED ORDER — FLUOROURACIL CHEMO INJECTION 2.5 GM/50ML
400.0000 mg/m2 | Freq: Once | INTRAVENOUS | Status: AC
  Administered 2019-09-27: 800 mg via INTRAVENOUS
  Filled 2019-09-27: qty 16

## 2019-09-27 MED ORDER — HEPARIN SOD (PORK) LOCK FLUSH 100 UNIT/ML IV SOLN: 500.0000 [IU] | Freq: Once | INTRAVENOUS | Status: DC

## 2019-09-27 NOTE — Progress Notes (Signed)
He sometimes has diarrhea and it is 1 episode usually in am and he takes imodium and it takes care of it

## 2019-09-29 ENCOUNTER — Other Ambulatory Visit: Payer: Self-pay

## 2019-09-29 ENCOUNTER — Inpatient Hospital Stay: Payer: Medicare Other

## 2019-09-29 DIAGNOSIS — C7800 Secondary malignant neoplasm of unspecified lung: Secondary | ICD-10-CM

## 2019-09-29 DIAGNOSIS — C189 Malignant neoplasm of colon, unspecified: Secondary | ICD-10-CM

## 2019-09-29 DIAGNOSIS — Z5111 Encounter for antineoplastic chemotherapy: Secondary | ICD-10-CM | POA: Diagnosis not present

## 2019-09-29 MED ORDER — PEGFILGRASTIM-CBQV 6 MG/0.6ML ~~LOC~~ SOSY: 6.0000 mg | PREFILLED_SYRINGE | Freq: Once | SUBCUTANEOUS | Status: DC

## 2019-09-29 MED ORDER — SODIUM CHLORIDE 0.9% FLUSH
10.0000 mL | INTRAVENOUS | Status: DC | PRN
  Administered 2019-09-29: 10 mL
  Filled 2019-09-29: qty 10

## 2019-09-29 MED ORDER — HEPARIN SOD (PORK) LOCK FLUSH 100 UNIT/ML IV SOLN
500.0000 [IU] | Freq: Once | INTRAVENOUS | Status: AC | PRN
  Administered 2019-09-29: 500 [IU]
  Filled 2019-09-29: qty 5

## 2019-09-29 NOTE — Progress Notes (Signed)
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 Patient is a 78 year old male who presented with evidence of bowel obstruction on 02/06/2017. At that time he had progressive abdominal distention and no bowel movement for about one week. CT abdomen showed an apple core lesion in the descending/sigmoid colon.  2. Patient underwent Hartmann's procedure for obstructing sigmoid colon mass on 02/06/2017. Preoperative CEA was 5.0  3. 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 It is 0 out of 15 lymph nodes were positive for malignancy. Perineural and lymphovascular invasion was present. Margins were negative.pT3N0. MMR stable.  4. Given that he had high risk stage II colon cancer including he presented with obstruction and has LVI and perineural invasion- adjuvant xeloda chemotherapy was recommended for 6 months.   5.Patient had evidence of hand foot syndrome after cycle 3. Cycle 4 delayed by 1 week. Topical urea cream prescribed  6. Patient completed 8 cycles of adjuvant xeloda in October 2018. polps seen in sigmoid and decending colon but negative for malignancy  7. Repeat Ct abdomen after 8 cycles showed no metastatic disease. 6 mm lung nodule noted in RLL  8.Patient had a repeat colonoscopy in October 2018 which showed polyps in his transverse and descending colon which were taken out and were negative for  high-grade dysplasia or malignancy. Patient subsequently underwent colostomy takedown procedure by Dr. Burt Knack  9. Repeat CT chest abdomen/ pelvis showed increase in the size of previously seen lung nodules largest being 1.4 cm consistent with metastatic disease. PET/CT showed mild uptake in 2 lung nodules. Others were not hypermetabolic. No other evidence of metastatic disease  10. Repeat lung biopsy consistent with colon adenocarcinoma. Comprehensive RAS panel testing showed mutant KRAS  11.Given the slow rate of growth of his colon cancer and desire to maintain his quality of life plan was to start chemotherapy after he gets a 6-monthbreak in summer. Repeat CT chest abdomen and pelvis in August 2019 showed mild increase in the size of his lung nodules. No new lung nodules are no new sites of distant metastatic disease.FOLFOX Avastin chemotherapy started on 06/22/2018.Patient completed 12 cycles of FOLFOX Avastin chemotherapy with continued response to his lung lesions. Oxaliplatin was dropped and patient is currently on 5-FU Avastin   Interval history-patient feels well and denies any complaints at this time.  He has occasional diarrhea which is mild and self-limited for which he uses Imodium.  Peripheral neuropathy in his hands and feet is stable  ECOG PS- 1 Pain scale- 0 Opioid associated constipation- no  Review of systems- Review of Systems  Constitutional: Negative for chills, fever, malaise/fatigue and weight loss.  HENT: Negative for congestion, ear discharge and nosebleeds.   Eyes: Negative for blurred vision.  Respiratory: Negative for cough, hemoptysis, sputum production, shortness of breath and wheezing.   Cardiovascular: Negative for chest pain, palpitations, orthopnea and claudication.  Gastrointestinal: Negative for abdominal pain, blood in stool, constipation, diarrhea, heartburn, melena, nausea and vomiting.  Genitourinary: Negative for dysuria, flank pain,  frequency, hematuria and urgency.  Musculoskeletal: Negative for back pain, joint pain and myalgias.  Skin: Negative for rash.  Neurological: Positive for sensory change (Peripheral neuropathy). Negative for dizziness, tingling, focal weakness, seizures, weakness and headaches.  Endo/Heme/Allergies: Does not bruise/bleed easily.  Psychiatric/Behavioral: Negative for depression and suicidal ideas. The patient does not have insomnia.       No Known Allergies   Past Medical History:  Diagnosis Date  . Cancer ([place])    skin cancer-nose w graft,also shoulder- basal cell per pt  . Cataract    r eye  . Chronic kidney disease    kidney stones 20 y ago per pt  . Colon cancer ([place]) 2018   Surgical resection and chemo tx's.  . Diabetes mellitus without complication ([place])   . Eczema   . History of kidney stones   . Hyperlipidemia   . Hypertension   . Vertigo      Past Surgical History:  Procedure Laterality Date  . CATARACT EXTRACTION Left   . COLECTOMY WITH COLOSTOMY CREATION/HARTMANN PROCEDURE N/A 02/06/2017   Procedure: COLECTOMY WITH COLOSTOMY CREATION/HARTMANN PROCEDURE;  Surgeon: [name], MD;  Location: [facility] ORS;  Service: General;  Laterality: N/A;  . COLON SURGERY    . COLONOSCOPY WITH PROPOFOL N/A 09/04/2017   Procedure: COLONOSCOPY WITH PROPOFOL;  Surgeon: Jonathon Bellows, MD;  Location[facility] ENDOSCOPY;  Service: Gastroenterology;  Laterality: N/A;  . COLOSTOMY CLOSURE N/A 10/20/2017   Procedure: COLOSTOMY CLOSURE;  Surgeon: [name], MD;  Location: [facility] ORS;  Service: General;  Laterality: N/A;  . CYSTOSCOPY    . FLEXIBLE SIGMOIDOSCOPY N/A 02/06/2017   Procedure: FLEXIBLE SIGMOIDOSCOPY;  Surgeon: Christene Lye, MD;  Location: [facility] ENDOSCOPY;  Service: Endoscopy;  Laterality: N/A;  . PORTACATH PLACEMENT N/A 06/17/2018   Procedure: INSERTION PORT-A-CATH, WITH FLUOROSCOPY;  Surgeon: [name], MD;  Location: [facility] ORS;  Service: General;  Laterality:  N/A;    Social History   Socioeconomic History  . Marital status: Married    Spouse name: Not on file  . Number of children: Not on file  . Years of education: Not on file  . Highest education level: Not on file  Occupational History  . Not on file  Social Needs  . Financial resource strain: Not on file  . Food insecurity    Worry: Not on file    Inability: Not on file  . Transportation needs    Medical: Not on file    Non-medical: Not on file  Tobacco Use  . Smoking status: Never Smoker  . Smokeless tobacco: Never Used  Substance and Sexual Activity  . Alcohol use: No  . Drug use: No  . Sexual activity: Not Currently  Lifestyle  . Physical activity    Days per week: Not on file    Minutes per session: Not on file  . Stress: Not on file  Relationships  . Social Herbalist on phone: Not on file    Gets together: Not on file    Attends religious service: Not on file    Active member of club or organization: Not on file    Attends meetings of clubs or organizations: Not on file    Relationship status: Not on file  . Intimate partner violence    Fear of current or ex partner: Not on file    Emotionally abused: Not on file    Physically abused: Not on file    Forced sexual activity:  Not on file  Other Topics Concern  . Not on file  Social History Narrative  . Not on file    Family History  Problem Relation Age of Onset  . Diabetes Mother   . AAA (abdominal aortic aneurysm) Mother   . Coronary artery disease Mother   . Diabetes Father   . Coronary artery disease Father   . Dementia Father   . Breast cancer Sister      Current Outpatient Medications:  .  acetaminophen (TYLENOL) 500 MG tablet, Take 500 mg by mouth every 6 (six) hours as needed (for pain.)., Disp: , Rfl:  .  aspirin EC 81 MG tablet, Take 81 mg by mouth daily., Disp: , Rfl:  .  Docusate Sodium (COLACE PO), Take 1 Dose by mouth as needed., Disp: , Rfl:  .  Dulaglutide 0.75 MG/0.5ML  SOPN, Inject into the skin once a week. , Disp: , Rfl:  .  gabapentin (NEURONTIN) 300 MG capsule, Take 3 capsules (900 mg total) by mouth 2 (two) times daily., Disp: 180 capsule, Rfl: 2 .  glimepiride (AMARYL) 2 MG tablet, Take 2 mg by mouth 2 (two) times daily before a meal. , Disp: , Rfl:  .  lidocaine-prilocaine (EMLA) cream, Place a small amount over cream over port 1 hour before each treatment, cover cream with saran wrap for clothing protection, Disp: 30 g, Rfl: 1 .  loratadine (CLARITIN) 10 MG tablet, Take 10 mg by mouth daily., Disp: , Rfl:  .  losartan (COZAAR) 25 MG tablet, Take 25 mg by mouth daily., Disp: , Rfl:  .  metFORMIN (GLUCOPHAGE) 1000 MG tablet, Take 1,000-1,500 tablets by mouth See admin instructions. TAKE 1 TABLET (1000 MG) BY MOUTH IN THE MORNING & TAKE 1.5 TABLETS (1500 MG) BY MOUTH AT SUPPER, Disp: , Rfl:  .  pravastatin (PRAVACHOL) 20 MG tablet, Take 20 mg by mouth daily with supper. , Disp: , Rfl:  .  triamcinolone (KENALOG) 0.025 % cream, Apply 1 application topically daily as needed (FOR EZCEMA)., Disp: 30 g, Rfl: 2 No current facility-administered medications for this visit.   Facility-Administered Medications Ordered in Other Visits:  .  0.9 %  sodium chloride infusion, , Intravenous, Continuous, Sindy Guadeloupe, MD, Stopped at 07/27/18 1037 .  heparin lock flush 100 unit/mL, 500 Units, Intravenous, Once, Sindy Guadeloupe, MD .  sodium chloride flush (NS) 0.9 % injection 10 mL, 10 mL, Intravenous, PRN, Sindy Guadeloupe, MD, 10 mL at 07/27/18 0920  Physical exam:  Vitals:   09/27/19 0857  BP: 140/63  Pulse: 75  Resp: 16  Temp: (!) 97.1 F (36.2 C)  TempSrc: Tympanic  Weight: 186 lb 11.2 oz (84.7 kg)  Height: _0  (1.676 m)   Physical Exam Constitutional:      General: He is not in acute distress. HENT:     Head: Normocephalic and atraumatic.  Eyes:     Pupils: Pupils are equal, round, and reactive to light.  Neck:     Musculoskeletal: Normal range of  motion.  Cardiovascular:     Rate and Rhythm: Normal rate and regular rhythm.     Heart sounds: Normal heart sounds.  Pulmonary:     Effort: Pulmonary effort is normal.     Breath sounds: Normal breath sounds.  Abdominal:     General: Bowel sounds are normal.     Palpations: Abdomen is soft.  Skin:    General: Skin is warm and dry.  Neurological:  Mental Status: He is alert and oriented to person, place, and time.      CMP Latest Ref Rng & Units 09/27/2019  Glucose 70 - 99 mg/dL 213(H)  BUN 8 - 23 mg/dL 14  Creatinine 0.61 - 1.24 mg/dL 1.13  Sodium 135 - 145 mmol/L 135  Potassium 3.5 - 5.1 mmol/L 3.9  Chloride 98 - 111 mmol/L 105  CO2 22 - 32 mmol/L 21(L)  Calcium 8.9 - 10.3 mg/dL 8.7(L)  Total Protein 6.5 - 8.1 g/dL 6.9  Total Bilirubin 0.3 - 1.2 mg/dL 0.5  Alkaline Phos 38 - 126 U/L 89  AST 15 - 41 U/L 31  ALT 0 - 44 U/L 23   CBC Latest Ref Rng & Units 09/27/2019  WBC 4.0 - 10.5 K/uL 11.1(H)  Hemoglobin 13.0 - 17.0 g/dL 11.5(L)  Hematocrit 39.0 - 52.0 % 37.2(L)  Platelets 150 - 400 K/uL 137(L)      Assessment and plan- Patient is a 78 y.o. male with stage IV colon cancer and lung metastases.  He has had progression on FOLFOX Avastin chemotherapy and is here for on treatment assessment prior to cycle 4 of FOLFIRI Avastin chemotherapy  Counts okay to proceed with cycle 4 of FOLFIRI Mvasi chemotherapy today.  He has been receiving growth factor support every other cycle and does not require with this cycle.  Chemo-induced anemia and thrombocytopenia: Mild and stable around 10-11.  Continue to monitor  Chemo-induced peripheral neuropathy: Continue gabapentin 900 mg 3 times daily  I will see him back in 2 weeks time with CBC with differential, CMP for cycle 5 of FOLFIRI Mvasi chemotherapy with pump DC on day 3 and receive possibly Udenyca on that day   Visit Diagnosis 1. Encounter for antineoplastic chemotherapy   2. Encounter for monoclonal antibody treatment for  malignancy   3. Malignant neoplasm of sigmoid colon ([place])   4. Malignant neoplasm metastatic to lung, unspecified laterality ([place])   5. Antineoplastic chemotherapy induced anemia   6. Chemotherapy-induced thrombocytopenia   7. Chemotherapy-induced peripheral neuropathy ([place])      Dr. Randa Evens, MD, MPH [facility] [phone] 09/29/2019 8:29 AM

## 2019-10-11 ENCOUNTER — Inpatient Hospital Stay (HOSPITAL_BASED_OUTPATIENT_CLINIC_OR_DEPARTMENT_OTHER): Payer: Medicare Other | Admitting: Oncology

## 2019-10-11 ENCOUNTER — Other Ambulatory Visit: Payer: Self-pay

## 2019-10-11 ENCOUNTER — Inpatient Hospital Stay: Payer: Medicare Other

## 2019-10-11 ENCOUNTER — Encounter: Payer: Self-pay | Admitting: Oncology

## 2019-10-11 ENCOUNTER — Inpatient Hospital Stay: Payer: Medicare Other | Attending: Oncology

## 2019-10-11 VITALS — BP 143/62 | HR 71 | Temp 98.1°F | Wt 186.0 lb

## 2019-10-11 DIAGNOSIS — C7800 Secondary malignant neoplasm of unspecified lung: Secondary | ICD-10-CM

## 2019-10-11 DIAGNOSIS — T451X5A Adverse effect of antineoplastic and immunosuppressive drugs, initial encounter: Secondary | ICD-10-CM | POA: Insufficient documentation

## 2019-10-11 DIAGNOSIS — Z803 Family history of malignant neoplasm of breast: Secondary | ICD-10-CM | POA: Insufficient documentation

## 2019-10-11 DIAGNOSIS — Z933 Colostomy status: Secondary | ICD-10-CM | POA: Diagnosis not present

## 2019-10-11 DIAGNOSIS — Z79899 Other long term (current) drug therapy: Secondary | ICD-10-CM | POA: Diagnosis not present

## 2019-10-11 DIAGNOSIS — D6481 Anemia due to antineoplastic chemotherapy: Secondary | ICD-10-CM | POA: Diagnosis not present

## 2019-10-11 DIAGNOSIS — Z5111 Encounter for antineoplastic chemotherapy: Secondary | ICD-10-CM | POA: Diagnosis not present

## 2019-10-11 DIAGNOSIS — C189 Malignant neoplasm of colon, unspecified: Secondary | ICD-10-CM

## 2019-10-11 DIAGNOSIS — Z95828 Presence of other vascular implants and grafts: Secondary | ICD-10-CM

## 2019-10-11 DIAGNOSIS — D701 Agranulocytosis secondary to cancer chemotherapy: Secondary | ICD-10-CM

## 2019-10-11 DIAGNOSIS — Z5189 Encounter for other specified aftercare: Secondary | ICD-10-CM | POA: Insufficient documentation

## 2019-10-11 DIAGNOSIS — C187 Malignant neoplasm of sigmoid colon: Secondary | ICD-10-CM | POA: Diagnosis present

## 2019-10-11 LAB — COMPREHENSIVE METABOLIC PANEL
ALT: 18 U/L (ref 0–44)
AST: 24 U/L (ref 15–41)
Albumin: 3.7 g/dL (ref 3.5–5.0)
Alkaline Phosphatase: 68 U/L (ref 38–126)
Anion gap: 9 (ref 5–15)
BUN: 12 mg/dL (ref 8–23)
CO2: 20 mmol/L — ABNORMAL LOW (ref 22–32)
Calcium: 8.6 mg/dL — ABNORMAL LOW (ref 8.9–10.3)
Chloride: 107 mmol/L (ref 98–111)
Creatinine, Ser: 1.24 mg/dL (ref 0.61–1.24)
GFR calc Af Amer: >60 mL/min (ref >60)
GFR calc non Af Amer: 55 mL/min — ABNORMAL LOW (ref >60)
Glucose, Bld: 193 mg/dL — ABNORMAL HIGH (ref 70–99)
Potassium: 4.1 mmol/L (ref 3.5–5.1)
Sodium: 136 mmol/L (ref 135–145)
Total Bilirubin: 0.6 mg/dL (ref 0.3–1.2)
Total Protein: 6.7 g/dL (ref 6.5–8.1)

## 2019-10-11 LAB — CBC WITH DIFFERENTIAL/PLATELET
Abs Immature Granulocytes: 0 10*3/uL (ref 0.00–0.07)
Basophils Absolute: 0 10*3/uL (ref 0.0–0.1)
Basophils Relative: 0 %
Eosinophils Absolute: 0.1 10*3/uL (ref 0.0–0.5)
Eosinophils Relative: 3 %
HCT: 34.6 % — ABNORMAL LOW (ref 39.0–52.0)
Hemoglobin: 10.6 g/dL — ABNORMAL LOW (ref 13.0–17.0)
Immature Granulocytes: 0 %
Lymphocytes Relative: 57 %
Lymphs Abs: 1.4 10*3/uL (ref 0.7–4.0)
MCH: 29.2 pg (ref 26.0–34.0)
MCHC: 30.6 g/dL (ref 30.0–36.0)
MCV: 95.3 fL (ref 80.0–100.0)
Monocytes Absolute: 0.3 10*3/uL (ref 0.1–1.0)
Monocytes Relative: 12 %
Neutro Abs: 0.7 10*3/uL — ABNORMAL LOW (ref 1.7–7.7)
Neutrophils Relative %: 28 %
Platelets: 160 10*3/uL (ref 150–400)
RBC: 3.63 MIL/uL — ABNORMAL LOW (ref 4.22–5.81)
RDW: 15.5 % (ref 11.5–15.5)
WBC: 2.5 10*3/uL — ABNORMAL LOW (ref 4.0–10.5)
nRBC: 0 % (ref 0.0–0.2)

## 2019-10-11 LAB — PROTEIN, URINE, RANDOM: Total Protein, Urine: 38 mg/dL

## 2019-10-11 MED ORDER — HEPARIN SOD (PORK) LOCK FLUSH 100 UNIT/ML IV SOLN
500.0000 [IU] | Freq: Once | INTRAVENOUS | Status: AC
  Administered 2019-10-11: 500 [IU] via INTRAVENOUS

## 2019-10-11 MED ORDER — SODIUM CHLORIDE 0.9% FLUSH
10.0000 mL | Freq: Once | INTRAVENOUS | Status: AC
  Filled 2019-10-11: qty 10

## 2019-10-11 NOTE — Progress Notes (Signed)
Patient stated that he had been doing well with no complaints. 

## 2019-10-13 ENCOUNTER — Inpatient Hospital Stay: Payer: Medicare Other

## 2019-10-13 NOTE — Progress Notes (Signed)
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 Patient is a 78 year old male who presented with evidence of bowel obstruction on 02/06/2017. At that time he had progressive abdominal distention and no bowel movement for about one week. CT abdomen showed an apple core lesion in the descending/sigmoid colon.  2. Patient underwent Hartmann's procedure for obstructing sigmoid colon mass on 02/06/2017. Preoperative CEA was 5.0  3. 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 It is 0 out of 15 lymph nodes were positive for malignancy. Perineural and lymphovascular invasion was present. Margins were negative.pT3N0. MMR stable.  4. Given that he had high risk stage II colon cancer including he presented with obstruction and has LVI and perineural invasion- adjuvant xeloda chemotherapy was recommended for 6 months.   5.Patient had evidence of hand foot syndrome after cycle 3. Cycle 4 delayed by 1 week. Topical urea cream prescribed  6. Patient completed 8 cycles of adjuvant xeloda in October 2018. polps seen in sigmoid and decending colon but negative for malignancy  7. Repeat Ct abdomen after 8 cycles showed no metastatic disease. 6 mm lung nodule noted in RLL  8.Patient had a repeat colonoscopy in October 2018 which showed polyps in his transverse and descending colon which were taken out and were negative for  high-grade dysplasia or malignancy. Patient subsequently underwent colostomy takedown procedure by Dr. Burt Knack  9. Repeat CT chest abdomen/ pelvis showed increase in the size of previously seen lung nodules largest being 1.4 cm consistent with metastatic disease. PET/CT showed mild uptake in 2 lung nodules. Others were not hypermetabolic. No other evidence of metastatic disease  10. Repeat lung biopsy consistent with colon adenocarcinoma. 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 Repeat CT chest abdomen and pelvis in August 2019 showed mild increase in the size of his lung nodules. No new lung nodules are no new sites of distant metastatic disease.FOLFOX Avastin chemotherapy started on 06/22/2018.Patient completed 12 cycles of FOLFOX Avastin chemotherapy with continued response to his lung lesions. Oxaliplatin was dropped and patient is currently on 5-FU Avastin  Interval history-patient feels well overall and denies any complaints at this time  ECOG PS- 1 Pain scale- 0 Opioid associated constipation- no  Review of systems- Review of Systems  Constitutional: Negative for chills, fever, malaise/fatigue and weight loss.  HENT: Negative for congestion, ear discharge and nosebleeds.   Eyes: Negative for blurred vision.  Respiratory: Negative for cough, hemoptysis, sputum production, shortness of breath and wheezing.   Cardiovascular: Negative for chest pain, palpitations, orthopnea and claudication.  Gastrointestinal: Negative for abdominal pain, blood in stool, constipation, diarrhea, heartburn, melena, nausea and vomiting.  Genitourinary: Negative for dysuria, flank pain, frequency, hematuria and urgency.  Musculoskeletal: Negative for back pain, joint pain and myalgias.  Skin: Negative for rash.   Neurological: Negative for dizziness,  tingling, focal weakness, seizures, weakness and headaches.  Endo/Heme/Allergies: Does not bruise/bleed easily.  Psychiatric/Behavioral: Negative for depression and suicidal ideas. The patient does not have insomnia.        No Known Allergies   Past Medical History:  Diagnosis Date  . Cancer ([place])    skin cancer-nose w graft,also shoulder- basal cell per pt  . Cataract    r eye  . Chronic kidney disease    kidney stones 20 y ago per pt  . Colon cancer ([place]) 2018   Surgical resection and chemo tx's.  . Diabetes mellitus without complication ([place])   . Eczema   . History of kidney stones   . Hyperlipidemia   . Hypertension   . Vertigo      Past Surgical History:  Procedure Laterality Date  . CATARACT EXTRACTION Left   . COLECTOMY WITH COLOSTOMY CREATION/HARTMANN PROCEDURE N/A 02/06/2017   Procedure: COLECTOMY WITH COLOSTOMY CREATION/HARTMANN PROCEDURE;  Surgeon: [name], MD;  Location: [facility] ORS;  Service: General;  Laterality: N/A;  . COLON SURGERY    . COLONOSCOPY WITH PROPOFOL N/A 09/04/2017   Procedure: COLONOSCOPY WITH PROPOFOL;  Surgeon: Jonathon Bellows, MD;  Location: [facility] ENDOSCOPY;  Service: Gastroenterology;  Laterality: N/A;  . COLOSTOMY CLOSURE N/A 10/20/2017   Procedure: COLOSTOMY CLOSURE;  Surgeon: [name], MD;  Location: [facility] ORS;  Service: General;  Laterality: N/A;  . CYSTOSCOPY    . FLEXIBLE SIGMOIDOSCOPY N/A 02/06/2017   Procedure: FLEXIBLE SIGMOIDOSCOPY;  Surgeon: Christene Lye, MD;  Location: [facility] ENDOSCOPY;  Service: Endoscopy;  Laterality: N/A;  . PORTACATH PLACEMENT N/A 06/17/2018   Procedure: INSERTION PORT-A-CATH, WITH FLUOROSCOPY;  Surgeon: [name], MD;  Location: [facility] ORS;  Service: General;  Laterality: N/A;    Social History   Socioeconomic History  . Marital status: Married    Spouse name: Not on file  . Number of children: Not on file  . Years of education: Not on file   . Highest education level: Not on file  Occupational History  . Not on file  Social Needs  . Financial resource strain: Not on file  . Food insecurity    Worry: Not on file    Inability: Not on file  . Transportation needs    Medical: Not on file    Non-medical: Not on file  Tobacco Use  . Smoking status: Never Smoker  . Smokeless tobacco: Never Used  Substance and Sexual Activity  . Alcohol use: No  . Drug use: No  . Sexual activity: Not Currently  Lifestyle  . Physical activity    Days per week: Not on file    Minutes per session: Not on file  . Stress: Not on file  Relationships  . Social Herbalist on phone: Not on file    Gets together: Not on file    Attends religious service: Not on file    Active member of club or organization: Not on file    Attends meetings of clubs or organizations: Not on file    Relationship status: Not on file  . Intimate partner violence    Fear of current or ex partner: Not on file    Emotionally abused: Not on file    Physically abused: Not on file    Forced sexual activity: Not on file  Other Topics Concern  . Not on file  Social History Narrative  . Not on file    Family History  Problem Relation  Age of Onset  . Diabetes Mother   . AAA (abdominal aortic aneurysm) Mother   . Coronary artery disease Mother   . Diabetes Father   . Coronary artery disease Father   . Dementia Father   . Breast cancer Sister      Current Outpatient Medications:  .  acetaminophen (TYLENOL) 500 MG tablet, Take 500 mg by mouth every 6 (six) hours as needed (for pain.)., Disp: , Rfl:  .  aspirin EC 81 MG tablet, Take 81 mg by mouth daily., Disp: , Rfl:  .  Docusate Sodium (COLACE PO), Take 1 Dose by mouth as needed., Disp: , Rfl:  .  Dulaglutide 0.75 MG/0.5ML SOPN, Inject into the skin once a week. , Disp: , Rfl:  .  gabapentin (NEURONTIN) 300 MG capsule, Take 3 capsules (900 mg total) by mouth 2 (two) times daily., Disp: 180 capsule,  Rfl: 2 .  glimepiride (AMARYL) 2 MG tablet, Take 2 mg by mouth 2 (two) times daily before a meal. , Disp: , Rfl:  .  lidocaine-prilocaine (EMLA) cream, Place a small amount over cream over port 1 hour before each treatment, cover cream with saran wrap for clothing protection, Disp: 30 g, Rfl: 1 .  loratadine (CLARITIN) 10 MG tablet, Take 10 mg by mouth daily., Disp: , Rfl:  .  losartan (COZAAR) 25 MG tablet, Take 25 mg by mouth daily., Disp: , Rfl:  .  metFORMIN (GLUCOPHAGE) 1000 MG tablet, Take 1,000-1,500 tablets by mouth See admin instructions. TAKE 1 TABLET (1000 MG) BY MOUTH IN THE MORNING & TAKE 1.5 TABLETS (1500 MG) BY MOUTH AT SUPPER, Disp: , Rfl:  .  pravastatin (PRAVACHOL) 20 MG tablet, Take 20 mg by mouth daily with supper. , Disp: , Rfl:  .  triamcinolone (KENALOG) 0.025 % cream, Apply 1 application topically daily as needed (FOR EZCEMA)., Disp: 30 g, Rfl: 2 No current facility-administered medications for this visit.   Facility-Administered Medications Ordered in Other Visits:  .  0.9 %  sodium chloride infusion, , Intravenous, Continuous, Sindy Guadeloupe, MD, Stopped at 07/27/18 1037 .  heparin lock flush 100 unit/mL, 500 Units, Intravenous, Once, Sindy Guadeloupe, MD .  sodium chloride flush (NS) 0.9 % injection 10 mL, 10 mL, Intravenous, PRN, Sindy Guadeloupe, MD, 10 mL at 07/27/18 0920 .  sodium chloride flush (NS) 0.9 % injection 10 mL, 10 mL, Intravenous, Once, Sindy Guadeloupe, MD  Physical exam:  Vitals:   10/11/19 0853  BP: (!) 143/62  Pulse: 71  Temp: 98.1 F (36.7 C)  TempSrc: Tympanic  Weight: 186 lb (84.4 kg)   Physical Exam Constitutional:      General: He is not in acute distress. HENT:     Head: Normocephalic and atraumatic.  Eyes:     Pupils: Pupils are equal, round, and reactive to light.  Neck:     Musculoskeletal: Normal range of motion.  Cardiovascular:     Rate and Rhythm: Normal rate and regular rhythm.     Heart sounds: Normal heart sounds.   Pulmonary:     Effort: Pulmonary effort is normal.     Breath sounds: Normal breath sounds.  Abdominal:     General: Bowel sounds are normal.     Palpations: Abdomen is soft.  Skin:    General: Skin is warm and dry.  Neurological:     Mental Status: He is alert and oriented to person, place, and time.      CMP Latest Ref  Rng & Units 10/11/2019  Glucose 70 - 99 mg/dL 193(H)  BUN 8 - 23 mg/dL 12  Creatinine 0.61 - 1.24 mg/dL 1.24  Sodium 135 - 145 mmol/L 136  Potassium 3.5 - 5.1 mmol/L 4.1  Chloride 98 - 111 mmol/L 107  CO2 22 - 32 mmol/L 20(L)  Calcium 8.9 - 10.3 mg/dL 8.6(L)  Total Protein 6.5 - 8.1 g/dL 6.7  Total Bilirubin 0.3 - 1.2 mg/dL 0.6  Alkaline Phos 38 - 126 U/L 68  AST 15 - 41 U/L 24  ALT 0 - 44 U/L 18   CBC Latest Ref Rng & Units 10/11/2019  WBC 4.0 - 10.5 K/uL 2.5(L)  Hemoglobin 13.0 - 17.0 g/dL 10.6(L)  Hematocrit 39.0 - 52.0 % 34.6(L)  Platelets 150 - 400 K/uL 160      Assessment and plan- Patient is a 78 y.o. male  with stage IV colon cancer and lung metastases. He has had progression on FOLFOX Avastin chemotherapy.Marland Kitchen  He is here for on treatment assessment prior to cycle 5 of FOLFIRI Avastin chemotherapy  Patient has been receiving Neulasta on day 3 of pump disconnect every other cycle.  He did not receive it during his last cycle as his white count was 11.  However today his white cell count was 2.5 with an ANC of 0.7.  I will therefore be holding his chemotherapy today.  His counts are likely to recover in 1 week's time.  He will directly proceed for FOLFIRI Mvasi chemotherapy next week.  He will come back on day 3 for pump disconnect and receive Udenyca on that day and moving forward he will be receiving growth factor support with every cycle.  We have been doing chemotherapy every 2 weeks since he started FOLFIRI.  However he is getting more myelosuppressive as evidenced by his low white count as well as his anemia.  His hemoglobin has slowly drifted down  from 12-10.  I will add iron studies B12 and folate to his next set of labs.  I will also do his chemotherapy every 3 weeks instead of every 2 weeks for better stability of his counts as he needs to be treated until progression or toxicity.  I will see him back in 4 weeks time with CBC with differential, CMP, urine protein and CEA for cycle 6 of FOLFIRI Mvasi chemotherapy     Visit Diagnosis 1. Chemotherapy induced neutropenia (HCC)   2. Encounter for antineoplastic chemotherapy   3. Malignant neoplasm metastatic to lung, unspecified laterality ([place])   4. 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[place])      Dr. Randa Evens, MD, MPH [facility] [phone] 10/13/2019 8:16 AM

## 2019-10-14 ENCOUNTER — Other Ambulatory Visit: Payer: Self-pay | Admitting: Oncology

## 2019-10-18 ENCOUNTER — Inpatient Hospital Stay: Payer: Medicare Other

## 2019-10-18 ENCOUNTER — Other Ambulatory Visit: Payer: Self-pay

## 2019-10-18 ENCOUNTER — Other Ambulatory Visit: Payer: Self-pay | Admitting: Oncology

## 2019-10-18 ENCOUNTER — Inpatient Hospital Stay: Payer: Medicare Other | Admitting: Oncology

## 2019-10-18 VITALS — BP 136/72 | HR 74 | Temp 97.0°F | Wt 186.1 lb

## 2019-10-18 DIAGNOSIS — C7800 Secondary malignant neoplasm of unspecified lung: Secondary | ICD-10-CM

## 2019-10-18 DIAGNOSIS — C189 Malignant neoplasm of colon, unspecified: Secondary | ICD-10-CM

## 2019-10-18 DIAGNOSIS — Z95828 Presence of other vascular implants and grafts: Secondary | ICD-10-CM

## 2019-10-18 DIAGNOSIS — T451X5A Adverse effect of antineoplastic and immunosuppressive drugs, initial encounter: Secondary | ICD-10-CM

## 2019-10-18 DIAGNOSIS — Z5111 Encounter for antineoplastic chemotherapy: Secondary | ICD-10-CM | POA: Diagnosis not present

## 2019-10-18 DIAGNOSIS — D701 Agranulocytosis secondary to cancer chemotherapy: Secondary | ICD-10-CM

## 2019-10-18 LAB — CBC WITH DIFFERENTIAL/PLATELET
Abs Immature Granulocytes: 0.04 10*3/uL (ref 0.00–0.07)
Basophils Absolute: 0.1 10*3/uL (ref 0.0–0.1)
Basophils Relative: 1 %
Eosinophils Absolute: 0.1 10*3/uL (ref 0.0–0.5)
Eosinophils Relative: 3 %
HCT: 37.8 % — ABNORMAL LOW (ref 39.0–52.0)
Hemoglobin: 11.3 g/dL — ABNORMAL LOW (ref 13.0–17.0)
Immature Granulocytes: 1 %
Lymphocytes Relative: 41 %
Lymphs Abs: 2.2 10*3/uL (ref 0.7–4.0)
MCH: 29 pg (ref 26.0–34.0)
MCHC: 29.9 g/dL — ABNORMAL LOW (ref 30.0–36.0)
MCV: 96.9 fL (ref 80.0–100.0)
Monocytes Absolute: 0.8 10*3/uL (ref 0.1–1.0)
Monocytes Relative: 14 %
Neutro Abs: 2.1 10*3/uL (ref 1.7–7.7)
Neutrophils Relative %: 40 %
Platelets: 257 10*3/uL (ref 150–400)
RBC: 3.9 MIL/uL — ABNORMAL LOW (ref 4.22–5.81)
RDW: 16.6 % — ABNORMAL HIGH (ref 11.5–15.5)
WBC: 5.4 10*3/uL (ref 4.0–10.5)
nRBC: 0 % (ref 0.0–0.2)

## 2019-10-18 LAB — FOLATE: Folate: 47 ng/mL (ref >5.9)

## 2019-10-18 LAB — VITAMIN B12: Vitamin B-12: 334 pg/mL (ref 180–914)

## 2019-10-18 LAB — IRON AND TIBC
Iron: 49 ug/dL (ref 45–182)
Saturation Ratios: 14 % — ABNORMAL LOW (ref 17.9–39.5)
TIBC: 359 ug/dL (ref 250–450)
UIBC: 310 ug/dL

## 2019-10-18 LAB — FERRITIN: Ferritin: 46 ng/mL (ref 24–336)

## 2019-10-18 LAB — PROTEIN, URINE, RANDOM: Total Protein, Urine: 24 mg/dL

## 2019-10-18 MED ORDER — ATROPINE SULFATE 1 MG/ML IJ SOLN
0.5000 mg | Freq: Once | INTRAMUSCULAR | Status: AC | PRN
  Administered 2019-10-18: 09:00:00 0.5 mg via INTRAVENOUS
  Filled 2019-10-18: qty 1

## 2019-10-18 MED ORDER — SODIUM CHLORIDE 0.9 % IV SOLN
2400.0000 mg/m2 | INTRAVENOUS | Status: DC
  Administered 2019-10-18: 4800 mg via INTRAVENOUS
  Filled 2019-10-18: qty 96

## 2019-10-18 MED ORDER — PALONOSETRON HCL INJECTION 0.25 MG/5ML
0.2500 mg | Freq: Once | INTRAVENOUS | Status: AC
  Administered 2019-10-18: 09:00:00 0.25 mg via INTRAVENOUS
  Filled 2019-10-18: qty 5

## 2019-10-18 MED ORDER — FLUOROURACIL CHEMO INJECTION 2.5 GM/50ML
400.0000 mg/m2 | Freq: Once | INTRAVENOUS | Status: AC
  Administered 2019-10-18: 800 mg via INTRAVENOUS
  Filled 2019-10-18: qty 16

## 2019-10-18 MED ORDER — SODIUM CHLORIDE 0.9 % IV SOLN
Freq: Once | INTRAVENOUS | Status: AC
  Administered 2019-10-18: 09:00:00 via INTRAVENOUS
  Filled 2019-10-18: qty 250

## 2019-10-18 MED ORDER — DEXAMETHASONE SODIUM PHOSPHATE 10 MG/ML IJ SOLN
10.0000 mg | Freq: Once | INTRAMUSCULAR | Status: AC
  Administered 2019-10-18: 10 mg via INTRAVENOUS
  Filled 2019-10-18: qty 1

## 2019-10-18 MED ORDER — IRINOTECAN HCL CHEMO INJECTION 100 MG/5ML
150.0000 mg/m2 | Freq: Once | INTRAVENOUS | Status: AC
  Administered 2019-10-18: 10:00:00 300 mg via INTRAVENOUS
  Filled 2019-10-18: qty 15

## 2019-10-18 MED ORDER — LEUCOVORIN CALCIUM INJECTION 350 MG
402.0000 mg/m2 | Freq: Once | INTRAVENOUS | Status: AC
  Administered 2019-10-18: 10:00:00 800 mg via INTRAVENOUS
  Filled 2019-10-18: qty 5

## 2019-10-18 MED ORDER — SODIUM CHLORIDE 0.9% FLUSH
10.0000 mL | Freq: Once | INTRAVENOUS | Status: AC
  Administered 2019-10-18: 08:00:00 10 mL via INTRAVENOUS
  Filled 2019-10-18: qty 10

## 2019-10-18 MED ORDER — SODIUM CHLORIDE 0.9 % IV SOLN
5.0000 mg/kg | Freq: Once | INTRAVENOUS | Status: AC
  Administered 2019-10-18: 400 mg via INTRAVENOUS
  Filled 2019-10-18: qty 16

## 2019-10-20 ENCOUNTER — Other Ambulatory Visit: Payer: Self-pay

## 2019-10-20 ENCOUNTER — Inpatient Hospital Stay: Payer: Medicare Other

## 2019-10-20 DIAGNOSIS — Z5111 Encounter for antineoplastic chemotherapy: Secondary | ICD-10-CM | POA: Diagnosis not present

## 2019-10-20 DIAGNOSIS — C189 Malignant neoplasm of colon, unspecified: Secondary | ICD-10-CM

## 2019-10-20 DIAGNOSIS — C7800 Secondary malignant neoplasm of unspecified lung: Secondary | ICD-10-CM

## 2019-10-20 MED ORDER — HEPARIN SOD (PORK) LOCK FLUSH 100 UNIT/ML IV SOLN
500.0000 [IU] | Freq: Once | INTRAVENOUS | Status: AC | PRN
  Administered 2019-10-20: 500 [IU]
  Filled 2019-10-20: qty 5

## 2019-10-20 MED ORDER — PEGFILGRASTIM-CBQV 6 MG/0.6ML ~~LOC~~ SOSY
6.0000 mg | PREFILLED_SYRINGE | Freq: Once | SUBCUTANEOUS | Status: AC
  Administered 2019-10-20: 6 mg via SUBCUTANEOUS
  Filled 2019-10-20: qty 0.6

## 2019-11-08 ENCOUNTER — Inpatient Hospital Stay: Payer: Medicare Other

## 2019-11-08 ENCOUNTER — Inpatient Hospital Stay (HOSPITAL_BASED_OUTPATIENT_CLINIC_OR_DEPARTMENT_OTHER): Payer: Medicare Other | Admitting: Oncology

## 2019-11-08 ENCOUNTER — Other Ambulatory Visit: Payer: Self-pay

## 2019-11-08 VITALS — BP 141/59 | HR 76 | Temp 97.1°F | Resp 16 | Ht 66.0 in | Wt 189.0 lb

## 2019-11-08 DIAGNOSIS — C7800 Secondary malignant neoplasm of unspecified lung: Secondary | ICD-10-CM

## 2019-11-08 DIAGNOSIS — C187 Malignant neoplasm of sigmoid colon: Secondary | ICD-10-CM | POA: Diagnosis not present

## 2019-11-08 DIAGNOSIS — C189 Malignant neoplasm of colon, unspecified: Secondary | ICD-10-CM

## 2019-11-08 DIAGNOSIS — Z5111 Encounter for antineoplastic chemotherapy: Secondary | ICD-10-CM

## 2019-11-08 DIAGNOSIS — Z5112 Encounter for antineoplastic immunotherapy: Secondary | ICD-10-CM | POA: Diagnosis not present

## 2019-11-08 DIAGNOSIS — D6481 Anemia due to antineoplastic chemotherapy: Secondary | ICD-10-CM

## 2019-11-08 DIAGNOSIS — T451X5A Adverse effect of antineoplastic and immunosuppressive drugs, initial encounter: Secondary | ICD-10-CM

## 2019-11-08 LAB — COMPREHENSIVE METABOLIC PANEL
ALT: 40 U/L (ref 0–44)
AST: 43 U/L — ABNORMAL HIGH (ref 15–41)
Albumin: 4 g/dL (ref 3.5–5.0)
Alkaline Phosphatase: 78 U/L (ref 38–126)
Anion gap: 13 (ref 5–15)
BUN: 16 mg/dL (ref 8–23)
CO2: 20 mmol/L — ABNORMAL LOW (ref 22–32)
Calcium: 9 mg/dL (ref 8.9–10.3)
Chloride: 105 mmol/L (ref 98–111)
Creatinine, Ser: 1.28 mg/dL — ABNORMAL HIGH (ref 0.61–1.24)
GFR calc Af Amer: >60 mL/min (ref >60)
GFR calc non Af Amer: 53 mL/min — ABNORMAL LOW (ref >60)
Glucose, Bld: 213 mg/dL — ABNORMAL HIGH (ref 70–99)
Potassium: 4.5 mmol/L (ref 3.5–5.1)
Sodium: 138 mmol/L (ref 135–145)
Total Bilirubin: 0.6 mg/dL (ref 0.3–1.2)
Total Protein: 6.9 g/dL (ref 6.5–8.1)

## 2019-11-08 LAB — CBC WITH DIFFERENTIAL/PLATELET
Abs Immature Granulocytes: 0.02 10*3/uL (ref 0.00–0.07)
Basophils Absolute: 0.1 10*3/uL (ref 0.0–0.1)
Basophils Relative: 1 %
Eosinophils Absolute: 0.2 10*3/uL (ref 0.0–0.5)
Eosinophils Relative: 3 %
HCT: 37.9 % — ABNORMAL LOW (ref 39.0–52.0)
Hemoglobin: 11.2 g/dL — ABNORMAL LOW (ref 13.0–17.0)
Immature Granulocytes: 0 %
Lymphocytes Relative: 28 %
Lymphs Abs: 2.3 10*3/uL (ref 0.7–4.0)
MCH: 28.5 pg (ref 26.0–34.0)
MCHC: 29.6 g/dL — ABNORMAL LOW (ref 30.0–36.0)
MCV: 96.4 fL (ref 80.0–100.0)
Monocytes Absolute: 0.7 10*3/uL (ref 0.1–1.0)
Monocytes Relative: 8 %
Neutro Abs: 4.8 10*3/uL (ref 1.7–7.7)
Neutrophils Relative %: 60 %
Platelets: 232 10*3/uL (ref 150–400)
RBC: 3.93 MIL/uL — ABNORMAL LOW (ref 4.22–5.81)
RDW: 17.4 % — ABNORMAL HIGH (ref 11.5–15.5)
WBC: 8.1 10*3/uL (ref 4.0–10.5)
nRBC: 0 % (ref 0.0–0.2)

## 2019-11-08 LAB — PROTEIN, URINE, RANDOM: Total Protein, Urine: 11 mg/dL

## 2019-11-08 MED ORDER — LEUCOVORIN CALCIUM INJECTION 350 MG
402.0000 mg/m2 | Freq: Once | INTRAVENOUS | Status: AC
  Administered 2019-11-08: 10:00:00 800 mg via INTRAVENOUS
  Filled 2019-11-08: qty 40

## 2019-11-08 MED ORDER — DEXAMETHASONE SODIUM PHOSPHATE 10 MG/ML IJ SOLN
10.0000 mg | Freq: Once | INTRAMUSCULAR | Status: AC
  Administered 2019-11-08: 10:00:00 10 mg via INTRAVENOUS
  Filled 2019-11-08: qty 1

## 2019-11-08 MED ORDER — FLUOROURACIL CHEMO INJECTION 2.5 GM/50ML
400.0000 mg/m2 | Freq: Once | INTRAVENOUS | Status: AC
  Administered 2019-11-08: 12:00:00 800 mg via INTRAVENOUS
  Filled 2019-11-08: qty 16

## 2019-11-08 MED ORDER — ATROPINE SULFATE 1 MG/ML IJ SOLN
0.5000 mg | Freq: Once | INTRAMUSCULAR | Status: AC | PRN
  Administered 2019-11-08: 10:00:00 0.5 mg via INTRAVENOUS
  Filled 2019-11-08: qty 1

## 2019-11-08 MED ORDER — SODIUM CHLORIDE 0.9 % IV SOLN
5.0000 mg/kg | Freq: Once | INTRAVENOUS | Status: AC
  Administered 2019-11-08: 10:00:00 400 mg via INTRAVENOUS
  Filled 2019-11-08: qty 16

## 2019-11-08 MED ORDER — PALONOSETRON HCL INJECTION 0.25 MG/5ML
0.2500 mg | Freq: Once | INTRAVENOUS | Status: AC
  Administered 2019-11-08: 10:00:00 0.25 mg via INTRAVENOUS
  Filled 2019-11-08: qty 5

## 2019-11-08 MED ORDER — SODIUM CHLORIDE 0.9 % IV SOLN
Freq: Once | INTRAVENOUS | Status: AC
  Filled 2019-11-08: qty 250

## 2019-11-08 MED ORDER — SODIUM CHLORIDE 0.9% FLUSH
10.0000 mL | Freq: Once | INTRAVENOUS | Status: AC
  Administered 2019-11-08: 08:00:00 10 mL via INTRAVENOUS
  Filled 2019-11-08: qty 10

## 2019-11-08 MED ORDER — IRINOTECAN HCL CHEMO INJECTION 100 MG/5ML
150.0000 mg/m2 | Freq: Once | INTRAVENOUS | Status: AC
  Administered 2019-11-08: 10:00:00 300 mg via INTRAVENOUS
  Filled 2019-11-08: qty 15

## 2019-11-08 MED ORDER — SODIUM CHLORIDE 0.9 % IV SOLN
2400.0000 mg/m2 | INTRAVENOUS | Status: DC
  Administered 2019-11-08: 12:00:00 4800 mg via INTRAVENOUS
  Filled 2019-11-08: qty 96

## 2019-11-08 NOTE — Progress Notes (Signed)
Pt 's hair almost completely gone, wants to know about taking vaccine for covid. He also thinks that he will need a scan in jan. Per pt said it would be this month per rao.

## 2019-11-09 LAB — CEA: CEA: 3.1 ng/mL (ref 0.0–4.7)

## 2019-11-10 ENCOUNTER — Encounter: Payer: Self-pay | Admitting: Oncology

## 2019-11-10 ENCOUNTER — Other Ambulatory Visit: Payer: Self-pay

## 2019-11-10 ENCOUNTER — Inpatient Hospital Stay: Payer: Medicare Other

## 2019-11-10 DIAGNOSIS — Z5111 Encounter for antineoplastic chemotherapy: Secondary | ICD-10-CM | POA: Diagnosis not present

## 2019-11-10 DIAGNOSIS — C189 Malignant neoplasm of colon, unspecified: Secondary | ICD-10-CM

## 2019-11-10 DIAGNOSIS — C7800 Secondary malignant neoplasm of unspecified lung: Secondary | ICD-10-CM

## 2019-11-10 MED ORDER — HEPARIN SOD (PORK) LOCK FLUSH 100 UNIT/ML IV SOLN
INTRAVENOUS | Status: AC
  Filled 2019-11-10: qty 5

## 2019-11-10 MED ORDER — HEPARIN SOD (PORK) LOCK FLUSH 100 UNIT/ML IV SOLN
500.0000 [IU] | Freq: Once | INTRAVENOUS | Status: AC | PRN
  Administered 2019-11-10: 13:00:00 500 [IU]
  Filled 2019-11-10: qty 5

## 2019-11-10 MED ORDER — PEGFILGRASTIM-CBQV 6 MG/0.6ML ~~LOC~~ SOSY
6.0000 mg | PREFILLED_SYRINGE | Freq: Once | SUBCUTANEOUS | Status: AC
  Administered 2019-11-10: 13:00:00 6 mg via SUBCUTANEOUS
  Filled 2019-11-10: qty 0.6

## 2019-11-10 NOTE — Progress Notes (Signed)
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 Patient is a 78 year old male who presented with evidence of bowel obstruction on 02/06/2017. At that time he had progressive abdominal distention and no bowel movement for about one week. CT abdomen showed an apple core lesion in the descending/sigmoid colon.  2. Patient underwent Hartmann's procedure for obstructing sigmoid colon mass on 02/06/2017. Preoperative CEA was 5.0  3. 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 It is 0 out of 15 lymph nodes were positive for malignancy. Perineural and lymphovascular invasion was present. Margins were negative.pT3N0. MMR stable.  4. Given that he had high risk stage II colon cancer including he presented with obstruction and has LVI and perineural invasion- adjuvant xeloda chemotherapy was recommended for 6 months.   5.Patient had evidence of hand foot syndrome after cycle 3. Cycle 4 delayed by 1 week. Topical urea cream prescribed  6. Patient completed 8 cycles of adjuvant xeloda in October 2018. polps seen in sigmoid and decending colon but negative for malignancy  7. Repeat Ct abdomen after 8 cycles showed no metastatic disease. 6 mm lung nodule noted in RLL  8.Patient had a repeat colonoscopy in October 2018 which showed polyps in his transverse and descending colon which were taken out and were negative for  high-grade dysplasia or malignancy. Patient subsequently underwent colostomy takedown procedure by Dr. Burt Knack  9. Repeat CT chest abdomen/ pelvis showed increase in the size of previously seen lung nodules largest being 1.4 cm consistent with metastatic disease. PET/CT showed mild uptake in 2 lung nodules. Others were not hypermetabolic. No other evidence of metastatic disease  10. Repeat lung biopsy consistent with colon adenocarcinoma. Comprehensive RAS panel testing showed mutant KRAS  11.Given the slow rate of growth of his colon cancer and desire to maintain his quality of life plan was to start chemotherapy after he gets a 79-monthbreak in summer. Repeat CT chest abdomen and pelvis in August 2019 showed mild increase in the size of his lung nodules. No new lung nodules are no new sites of distant metastatic disease.FOLFOX Avastin chemotherapy started on 06/22/2018.Patient completed 12 cycles of FOLFOX Avastin chemotherapy with continued response to his lung lesions. Oxaliplatin was dropped and patient is currently on 5-FU Avastin  Interval history-he is doing well overall and denies any complaints at this time.  Tingling numbness in his hands and feet is mild and overall stable  ECOG PS- 1 Pain scale- 0   Review of systems- Review of Systems  Constitutional: Negative for chills, fever, malaise/fatigue and weight loss.  HENT: Negative for congestion, ear discharge and nosebleeds.   Eyes: Negative for blurred vision.  Respiratory: Negative for cough, hemoptysis, sputum production, shortness of breath and wheezing.   Cardiovascular: Negative for chest pain, palpitations, orthopnea and claudication.  Gastrointestinal: Negative for abdominal pain, blood in stool, constipation, diarrhea, heartburn, melena, nausea and vomiting.  Genitourinary: Negative for dysuria, flank pain, frequency, hematuria and urgency.  Musculoskeletal: Negative for back pain, joint pain and myalgias.  Skin:  Negative for rash.  Neurological: Positive for sensory change (Peripheral neuropathy). Negative for dizziness, tingling, focal weakness, seizures, weakness and headaches.  Endo/Heme/Allergies: Does not bruise/bleed easily.  Psychiatric/Behavioral: Negative for depression and suicidal ideas. The patient does not have insomnia.       No Known Allergies   Past Medical History:  Diagnosis Date  . Cancer ([place])    skin cancer-nose w graft,also shoulder- basal cell per pt  . Cataract    r eye  . Chronic kidney disease    kidney stones 20 y ago per pt  . Colon cancer ([place]) 2018   Surgical resection and chemo tx's.  . Diabetes mellitus without complication ([place])   . Eczema   . History of kidney stones   . Hyperlipidemia   . Hypertension   . Vertigo      Past Surgical History:  Procedure Laterality Date  . CATARACT EXTRACTION Left   . COLECTOMY WITH COLOSTOMY CREATION/HARTMANN PROCEDURE N/A 02/06/2017   Procedure: COLECTOMY WITH COLOSTOMY CREATION/HARTMANN PROCEDURE;  Surgeon: [name], MD;  Location: [facility] ORS;  Service: General;  Laterality: N/A;  . COLON SURGERY    . COLONOSCOPY WITH PROPOFOL N/A 09/04/2017   Procedure: COLONOSCOPY WITH PROPOFOL;  Surgeon: Jonathon Bellows, MD;  Location: [facility] ENDOSCOPY;  Service: Gastroenterology;  Laterality: N/A;  . COLOSTOMY CLOSURE N/A 10/20/2017   Procedure: COLOSTOMY CLOSURE;  Surgeon: [name], MD;  Location: [facility] ORS;  Service: General;  Laterality: N/A;  . CYSTOSCOPY    . FLEXIBLE SIGMOIDOSCOPY N/A 02/06/2017   Procedure: FLEXIBLE SIGMOIDOSCOPY;  Surgeon: Christene Lye, MD;  Location: [facility] ENDOSCOPY;  Service: Endoscopy;  Laterality: N/A;  . PORTACATH PLACEMENT N/A 06/17/2018   Procedure: INSERTION PORT-A-CATH, WITH FLUOROSCOPY;  Surgeon: [name], MD;  Location: [facility] ORS;  Service: General;  Laterality: N/A;    Social History   Socioeconomic History  . Marital status: Married    Spouse name: Not on file    . Number of children: Not on file  . Years of education: Not on file  . Highest education level: Not on file  Occupational History  . Not on file  Tobacco Use  . Smoking status: Never Smoker  . Smokeless tobacco: Never Used  Substance and Sexual Activity  . Alcohol use: No  . Drug use: No  . Sexual activity: Not Currently  Other Topics Concern  . Not on file  Social History Narrative  . Not on file   Social Determinants of Health   Financial Resource Strain:   . Difficulty of Paying Living Expenses: Not on file  Food Insecurity:   . Worried About Charity fundraiser in the Last Year: Not on file  . Ran Out of Food in the Last Year: Not on file  Transportation Needs:   . Lack of Transportation (Medical): Not on file  . Lack of Transportation (Non-Medical): Not on file  Physical Activity:   . Days of Exercise per Week: Not on file  . Minutes of Exercise per Session: Not on file  Stress:   . Feeling of Stress : Not on file  Social Connections:   . Frequency of Communication with Friends and Family: Not on file  . Frequency of Social Gatherings with Friends and Family: Not on file  . Attends Religious Services: Not on file  . Active Member of Clubs or Organizations: Not on file  . Attends Archivist Meetings: Not on file  . Marital Status: Not  on file  Intimate Partner Violence:   . Fear of Current or Ex-Partner: Not on file  . Emotionally Abused: Not on file  . Physically Abused: Not on file  . Sexually Abused: Not on file    Family History  Problem Relation Age of Onset  . Diabetes Mother   . AAA (abdominal aortic aneurysm) Mother   . Coronary artery disease Mother   . Diabetes Father   . Coronary artery disease Father   . Dementia Father   . Breast cancer Sister      Current Outpatient Medications:  .  acetaminophen (TYLENOL) 500 MG tablet, Take 500 mg by mouth every 6 (six) hours as needed (for pain.)., Disp: , Rfl:  .  aspirin EC 81 MG tablet,  Take 81 mg by mouth daily., Disp: , Rfl:  .  Docusate Sodium (COLACE PO), Take 1 Dose by mouth as needed., Disp: , Rfl:  .  Dulaglutide 0.75 MG/0.5ML SOPN, Inject into the skin once a week. , Disp: , Rfl:  .  gabapentin (NEURONTIN) 300 MG capsule, TAKE 3 CAPSULES (900 MG TOTAL) BY MOUTH 2 (TWO) TIMES DAILY., Disp: 180 capsule, Rfl: 2 .  glimepiride (AMARYL) 2 MG tablet, Take 2 mg by mouth 2 (two) times daily before a meal. , Disp: , Rfl:  .  lidocaine-prilocaine (EMLA) cream, Place a small amount over cream over port 1 hour before each treatment, cover cream with saran wrap for clothing protection, Disp: 30 g, Rfl: 1 .  loratadine (CLARITIN) 10 MG tablet, Take 10 mg by mouth daily., Disp: , Rfl:  .  losartan (COZAAR) 25 MG tablet, Take 25 mg by mouth daily., Disp: , Rfl:  .  metFORMIN (GLUCOPHAGE) 1000 MG tablet, Take 1,000-1,500 tablets by mouth See admin instructions. TAKE 1 TABLET (1000 MG) BY MOUTH IN THE MORNING & TAKE 1.5 TABLETS (1500 MG) BY MOUTH AT SUPPER, Disp: , Rfl:  .  pravastatin (PRAVACHOL) 20 MG tablet, Take 20 mg by mouth daily with supper. , Disp: , Rfl:  .  triamcinolone (KENALOG) 0.025 % cream, Apply 1 application topically daily as needed (FOR EZCEMA)., Disp: 30 g, Rfl: 2 No current facility-administered medications for this visit.  Facility-Administered Medications Ordered in Other Visits:  .  0.9 %  sodium chloride infusion, , Intravenous, Continuous, Sindy Guadeloupe, MD, Stopped at 07/27/18 1037 .  heparin lock flush 100 unit/mL, 500 Units, Intravenous, Once, Sindy Guadeloupe, MD .  sodium chloride flush (NS) 0.9 % injection 10 mL, 10 mL, Intravenous, PRN, Sindy Guadeloupe, MD, 10 mL at 07/27/18 0920 .  sodium chloride flush (NS) 0.9 % injection 10 mL, 10 mL, Intravenous, Once, Sindy Guadeloupe, MD  Physical exam:  Vitals:   11/08/19 0838  BP: (!) 141/59  Pulse: 76  Resp: 16  Temp: (!) 97.1 F (36.2 C)  TempSrc: Tympanic  Weight: 189 lb (85.7 kg)  Height: '5\' 6"'  (1.676 m)    Physical Exam Constitutional:      General: He is not in acute distress. HENT:     Head: Normocephalic and atraumatic.  Eyes:     Pupils: Pupils are equal, round, and reactive to light.  Cardiovascular:     Rate and Rhythm: Normal rate and regular rhythm.     Heart sounds: Normal heart sounds.  Pulmonary:     Effort: Pulmonary effort is normal.     Breath sounds: Normal breath sounds.  Abdominal:     General: Bowel sounds are normal.  Palpations: Abdomen is soft.  Musculoskeletal:     Cervical back: Normal range of motion.  Skin:    General: Skin is warm and dry.  Neurological:     Mental Status: He is alert and oriented to person, place, and time.      CMP Latest Ref Rng & Units 11/08/2019  Glucose 70 - 99 mg/dL 213(H)  BUN 8 - 23 mg/dL 16  Creatinine 0.61 - 1.24 mg/dL 1.28(H)  Sodium 135 - 145 mmol/L 138  Potassium 3.5 - 5.1 mmol/L 4.5  Chloride 98 - 111 mmol/L 105  CO2 22 - 32 mmol/L 20(L)  Calcium 8.9 - 10.3 mg/dL 9.0  Total Protein 6.5 - 8.1 g/dL 6.9  Total Bilirubin 0.3 - 1.2 mg/dL 0.6  Alkaline Phos 38 - 126 U/L 78  AST 15 - 41 U/L 43(H)  ALT 0 - 44 U/L 40   CBC Latest Ref Rng & Units 11/08/2019  WBC 4.0 - 10.5 K/uL 8.1  Hemoglobin 13.0 - 17.0 g/dL 11.2(L)  Hematocrit 39.0 - 52.0 % 37.9(L)  Platelets 150 - 400 K/uL 232    Assessment and plan- Patient is a 78 y.o. male with stage IV colon cancer and lung metastases. He has had progression on FOLFOX Avastin chemotherapy.  He is here for on treatment assessment prior to cycle 6 of FOLFIRI Avastin chemotherapy  Counts okay to proceed with cycle 6 of FOLFIRI Avastin chemotherapy today.  He will receive udencya on day 3 of pump disconnect as he does develops neutropenia at times.  I will see him back in 3 weeks with CBC with differential, CMP and CEA for cycle 7 of FOLFIRI Mvasi chemotherapy.  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 Malignant neoplasm metastatic to lung, unspecified laterality ([place])   2. Malignant neoplasm of sigmoid colon ([place])   3. Encounter for antineoplastic chemotherapy   4. Encounter for monoclonal antibody treatment for malignancy   5. Antineoplastic chemotherapy induced anemia      Dr. Randa Evens, MD, MPH [facility] at [facility] [phone] 11/10/2019 2:50 PM

## 2019-11-18 ENCOUNTER — Telehealth: Payer: Self-pay | Admitting: *Deleted

## 2019-11-18 NOTE — Telephone Encounter (Signed)
Called pt and got his voicemail and left message that he can get an appt at health dept at [facility] and I left the phone number for it. I also told him that the [place] [facility] are suppose to rollout in next couple of weeks.

## 2019-11-29 ENCOUNTER — Inpatient Hospital Stay: Payer: Medicare Other

## 2019-11-29 ENCOUNTER — Encounter: Payer: Self-pay | Admitting: Oncology

## 2019-11-29 ENCOUNTER — Other Ambulatory Visit: Payer: Self-pay

## 2019-11-29 ENCOUNTER — Other Ambulatory Visit: Payer: Self-pay | Admitting: *Deleted

## 2019-11-29 ENCOUNTER — Inpatient Hospital Stay: Payer: Medicare Other | Attending: Oncology

## 2019-11-29 ENCOUNTER — Inpatient Hospital Stay (HOSPITAL_BASED_OUTPATIENT_CLINIC_OR_DEPARTMENT_OTHER): Payer: Medicare Other | Admitting: Oncology

## 2019-11-29 VITALS — BP 129/68 | HR 75 | Temp 97.0°F | Resp 16 | Wt 189.0 lb

## 2019-11-29 DIAGNOSIS — Z5111 Encounter for antineoplastic chemotherapy: Secondary | ICD-10-CM

## 2019-11-29 DIAGNOSIS — C189 Malignant neoplasm of colon, unspecified: Secondary | ICD-10-CM

## 2019-11-29 DIAGNOSIS — Z5112 Encounter for antineoplastic immunotherapy: Secondary | ICD-10-CM | POA: Diagnosis not present

## 2019-11-29 DIAGNOSIS — Z5189 Encounter for other specified aftercare: Secondary | ICD-10-CM | POA: Insufficient documentation

## 2019-11-29 DIAGNOSIS — R21 Rash and other nonspecific skin eruption: Secondary | ICD-10-CM

## 2019-11-29 DIAGNOSIS — C7800 Secondary malignant neoplasm of unspecified lung: Secondary | ICD-10-CM

## 2019-11-29 DIAGNOSIS — G620 Drug-induced polyneuropathy: Secondary | ICD-10-CM

## 2019-11-29 DIAGNOSIS — E782 Mixed hyperlipidemia: Secondary | ICD-10-CM

## 2019-11-29 DIAGNOSIS — C187 Malignant neoplasm of sigmoid colon: Secondary | ICD-10-CM | POA: Insufficient documentation

## 2019-11-29 DIAGNOSIS — N189 Chronic kidney disease, unspecified: Secondary | ICD-10-CM

## 2019-11-29 DIAGNOSIS — T451X5A Adverse effect of antineoplastic and immunosuppressive drugs, initial encounter: Secondary | ICD-10-CM

## 2019-11-29 DIAGNOSIS — E119 Type 2 diabetes mellitus without complications: Secondary | ICD-10-CM

## 2019-11-29 LAB — CBC WITH DIFFERENTIAL/PLATELET
Abs Immature Granulocytes: 0.05 10*3/uL (ref 0.00–0.07)
Basophils Absolute: 0.1 10*3/uL (ref 0.0–0.1)
Basophils Relative: 1 %
Eosinophils Absolute: 0.3 10*3/uL (ref 0.0–0.5)
Eosinophils Relative: 4 %
HCT: 38.2 % — ABNORMAL LOW (ref 39.0–52.0)
Hemoglobin: 11.5 g/dL — ABNORMAL LOW (ref 13.0–17.0)
Immature Granulocytes: 1 %
Lymphocytes Relative: 25 %
Lymphs Abs: 2.3 10*3/uL (ref 0.7–4.0)
MCH: 29 pg (ref 26.0–34.0)
MCHC: 30.1 g/dL (ref 30.0–36.0)
MCV: 96.5 fL (ref 80.0–100.0)
Monocytes Absolute: 0.7 10*3/uL (ref 0.1–1.0)
Monocytes Relative: 8 %
Neutro Abs: 5.5 10*3/uL (ref 1.7–7.7)
Neutrophils Relative %: 61 %
Platelets: 199 10*3/uL (ref 150–400)
RBC: 3.96 MIL/uL — ABNORMAL LOW (ref 4.22–5.81)
RDW: 17.8 % — ABNORMAL HIGH (ref 11.5–15.5)
WBC: 8.9 10*3/uL (ref 4.0–10.5)
nRBC: 0 % (ref 0.0–0.2)

## 2019-11-29 LAB — COMPREHENSIVE METABOLIC PANEL
ALT: 39 U/L (ref 0–44)
AST: 43 U/L — ABNORMAL HIGH (ref 15–41)
Albumin: 4 g/dL (ref 3.5–5.0)
Alkaline Phosphatase: 76 U/L (ref 38–126)
Anion gap: 11 (ref 5–15)
BUN: 17 mg/dL (ref 8–23)
CO2: 21 mmol/L — ABNORMAL LOW (ref 22–32)
Calcium: 9.2 mg/dL (ref 8.9–10.3)
Chloride: 104 mmol/L (ref 98–111)
Creatinine, Ser: 1.3 mg/dL — ABNORMAL HIGH (ref 0.61–1.24)
GFR calc Af Amer: >60 mL/min (ref >60)
GFR calc non Af Amer: 52 mL/min — ABNORMAL LOW (ref >60)
Glucose, Bld: 210 mg/dL — ABNORMAL HIGH (ref 70–99)
Potassium: 4.4 mmol/L (ref 3.5–5.1)
Sodium: 136 mmol/L (ref 135–145)
Total Bilirubin: 0.5 mg/dL (ref 0.3–1.2)
Total Protein: 7.1 g/dL (ref 6.5–8.1)

## 2019-11-29 LAB — PROTEIN, URINE, RANDOM: Total Protein, Urine: 15 mg/dL

## 2019-11-29 MED ORDER — FLUOROURACIL CHEMO INJECTION 2.5 GM/50ML
400.0000 mg/m2 | Freq: Once | INTRAVENOUS | Status: AC
  Administered 2019-11-29: 800 mg via INTRAVENOUS
  Filled 2019-11-29: qty 16

## 2019-11-29 MED ORDER — IRINOTECAN HCL CHEMO INJECTION 100 MG/5ML
150.0000 mg/m2 | Freq: Once | INTRAVENOUS | Status: AC
  Administered 2019-11-29: 10:00:00 300 mg via INTRAVENOUS
  Filled 2019-11-29: qty 15

## 2019-11-29 MED ORDER — SODIUM CHLORIDE 0.9 % IV SOLN
5.0000 mg/kg | Freq: Once | INTRAVENOUS | Status: AC
  Administered 2019-11-29: 10:00:00 400 mg via INTRAVENOUS
  Filled 2019-11-29: qty 16

## 2019-11-29 MED ORDER — TRIAMCINOLONE ACETONIDE 0.025 % EX CREA: 1.0000 "application " | TOPICAL_CREAM | Freq: Every day | CUTANEOUS | 2 refills | Status: DC | PRN

## 2019-11-29 MED ORDER — DEXAMETHASONE SODIUM PHOSPHATE 10 MG/ML IJ SOLN
10.0000 mg | Freq: Once | INTRAMUSCULAR | Status: AC
  Administered 2019-11-29: 09:00:00 10 mg via INTRAVENOUS
  Filled 2019-11-29: qty 1

## 2019-11-29 MED ORDER — HEPARIN SOD (PORK) LOCK FLUSH 100 UNIT/ML IV SOLN
500.0000 [IU] | Freq: Once | INTRAVENOUS | Status: DC | PRN
  Filled 2019-11-29: qty 5

## 2019-11-29 MED ORDER — SODIUM CHLORIDE 0.9 % IV SOLN
Freq: Once | INTRAVENOUS | Status: AC
  Filled 2019-11-29: qty 250

## 2019-11-29 MED ORDER — PALONOSETRON HCL INJECTION 0.25 MG/5ML
0.2500 mg | Freq: Once | INTRAVENOUS | Status: AC
  Administered 2019-11-29: 09:00:00 0.25 mg via INTRAVENOUS
  Filled 2019-11-29: qty 5

## 2019-11-29 MED ORDER — SODIUM CHLORIDE 0.9 % IV SOLN
2400.0000 mg/m2 | INTRAVENOUS | Status: DC
  Administered 2019-11-29: 12:00:00 4800 mg via INTRAVENOUS
  Filled 2019-11-29: qty 96

## 2019-11-29 MED ORDER — ATROPINE SULFATE 1 MG/ML IJ SOLN
0.5000 mg | Freq: Once | INTRAMUSCULAR | Status: AC | PRN
  Administered 2019-11-29: 09:00:00 0.5 mg via INTRAVENOUS
  Filled 2019-11-29: qty 1

## 2019-11-29 MED ORDER — LEUCOVORIN CALCIUM INJECTION 350 MG
402.0000 mg/m2 | Freq: Once | INTRAVENOUS | Status: AC
  Administered 2019-11-29: 10:00:00 800 mg via INTRAVENOUS
  Filled 2019-11-29: qty 40

## 2019-11-29 NOTE — Progress Notes (Signed)
Pt wants to have his labs from PCP next treatment done here and not got stuck twice on same day. He also has sore on left arm that is not healed

## 2019-12-01 ENCOUNTER — Inpatient Hospital Stay: Payer: Medicare Other

## 2019-12-01 ENCOUNTER — Other Ambulatory Visit: Payer: Self-pay

## 2019-12-01 VITALS — BP 149/72 | HR 75 | Temp 97.0°F | Resp 18

## 2019-12-01 DIAGNOSIS — Z5111 Encounter for antineoplastic chemotherapy: Secondary | ICD-10-CM | POA: Diagnosis not present

## 2019-12-01 DIAGNOSIS — C7800 Secondary malignant neoplasm of unspecified lung: Secondary | ICD-10-CM

## 2019-12-01 DIAGNOSIS — C189 Malignant neoplasm of colon, unspecified: Secondary | ICD-10-CM

## 2019-12-01 MED ORDER — SODIUM CHLORIDE 0.9% FLUSH
10.0000 mL | INTRAVENOUS | Status: DC | PRN
  Administered 2019-12-01: 10 mL
  Filled 2019-12-01: qty 10

## 2019-12-01 MED ORDER — HEPARIN SOD (PORK) LOCK FLUSH 100 UNIT/ML IV SOLN
INTRAVENOUS | Status: AC
  Filled 2019-12-01: qty 5

## 2019-12-01 MED ORDER — PEGFILGRASTIM-CBQV 6 MG/0.6ML ~~LOC~~ SOSY
6.0000 mg | PREFILLED_SYRINGE | Freq: Once | SUBCUTANEOUS | Status: AC
  Administered 2019-12-01: 6 mg via SUBCUTANEOUS
  Filled 2019-12-01: qty 0.6

## 2019-12-01 MED ORDER — HEPARIN SOD (PORK) LOCK FLUSH 100 UNIT/ML IV SOLN
500.0000 [IU] | Freq: Once | INTRAVENOUS | Status: AC | PRN
  Administered 2019-12-01: 12:00:00 500 [IU]
  Filled 2019-12-01: qty 5

## 2019-12-01 NOTE — Progress Notes (Signed)
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 Patient is a 79 year old male who presented with evidence of bowel obstruction on 02/06/2017. At that time he had progressive abdominal distention and no bowel movement for about one week. CT abdomen showed an apple core lesion in the descending/sigmoid colon.  2. Patient underwent Hartmann's procedure for obstructing sigmoid colon mass on 02/06/2017. Preoperative CEA was 5.0  3. 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 It is 0 out of 15 lymph nodes were positive for malignancy. Perineural and lymphovascular invasion was present. Margins were negative.pT3N0. MMR stable.  4. Given that he had high risk stage II colon cancer including he presented with obstruction and has LVI and perineural invasion- adjuvant xeloda chemotherapy was recommended for 6 months.   5.Patient had evidence of hand foot syndrome after cycle 3. Cycle 4 delayed by 1 week. Topical urea cream prescribed  6. Patient completed 8 cycles of adjuvant xeloda in October 2018. polps seen in sigmoid and decending colon but negative for malignancy  7. Repeat Ct abdomen after 8 cycles showed no metastatic disease. 6 mm lung nodule noted in RLL  8.Patient had a repeat colonoscopy in October 2018 which showed polyps in his transverse and descending colon which were taken out and were negative for  high-grade dysplasia or malignancy. Patient subsequently underwent colostomy takedown procedure by Dr. Burt Knack  9. Repeat CT chest abdomen/ pelvis showed increase in the size of previously seen lung nodules largest being 1.4 cm consistent with metastatic disease. PET/CT showed mild uptake in 2 lung nodules. Others were not hypermetabolic. No other evidence of metastatic disease  10. Repeat lung biopsy consistent with colon adenocarcinoma. Comprehensive RAS panel testing showed mutant KRAS  11.Given the slow rate of growth of his colon cancer and desire to maintain his quality of life plan was to start chemotherapy after he gets a 33-monthbreak in summer. Repeat CT chest abdomen and pelvis in August 2019 showed mild increase in the size of his lung nodules. No new lung nodules are no new sites of distant metastatic disease.FOLFOX Avastin chemotherapy started on 06/22/2018.Patient completed 12 cycles of FOLFOX Avastin chemotherapy with continued response to his lung lesions. Oxaliplatin was dropped and patient is currently on 5-FU Avastin.  He did not have mild progression of her disease in lung nodules and was switched to second line FOLFIRI Mvasi   Interval history-patient is tolerating FOLFIRI Mvasi chemotherapy well without any significant side effects.  Denies any diarrhea.  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  He got his first dose of Covid vaccine.  He does report a new skin rash over his neck proximal forearm  ECOG PS- 1 Pain scale- 0   Review of systems- Review of Systems  Constitutional: Negative for chills, fever, malaise/fatigue and weight loss.  HENT: Negative for congestion, ear discharge and nosebleeds.   Eyes: Negative for blurred vision.  Respiratory: Negative for cough, hemoptysis, sputum production, shortness of breath and wheezing.   Cardiovascular: Negative for chest pain,  palpitations, orthopnea and claudication.  Gastrointestinal: Negative for abdominal  pain, blood in stool, constipation, diarrhea, heartburn, melena, nausea and vomiting.  Genitourinary: Negative for dysuria, flank pain, frequency, hematuria and urgency.  Musculoskeletal: Negative for back pain, joint pain and myalgias.  Skin: Negative for rash.  Neurological: Positive for sensory change (Peripheral neuropathy). Negative for dizziness, tingling, focal weakness, seizures, weakness and headaches.  Endo/Heme/Allergies: Does not bruise/bleed easily.  Psychiatric/Behavioral: Negative for depression and suicidal ideas. The patient does not have insomnia.       No Known Allergies   Past Medical History:  Diagnosis Date  . Cancer ([place])    skin cancer-nose w graft,also shoulder- basal cell per pt  . Cataract    r eye  . Chronic kidney disease    kidney stones 20 y ago per pt  . Colon cancer ([place]) 2018   Surgical resection and chemo tx's.  . Diabetes mellitus without complication ([place])   . Eczema   . History of kidney stones   . Hyperlipidemia   . Hypertension   . Vertigo      Past Surgical History:  Procedure Laterality Date  . CATARACT EXTRACTION Left   . COLECTOMY WITH COLOSTOMY CREATION/HARTMANN PROCEDURE N/A 02/06/2017   Procedure: COLECTOMY WITH COLOSTOMY CREATION/HARTMANN PROCEDURE;  Surgeon: [name], MD;  Location: [facility] ORS;  Service: General;  Laterality: N/A;  . COLON SURGERY    . COLONOSCOPY WITH PROPOFOL N/A 09/04/2017   Procedure: COLONOSCOPY WITH PROPOFOL;  Surgeon: Jonathon Bellows, MD;  Location: [facility] ENDOSCOPY;  Service: Gastroenterology;  Laterality: N/A;  . COLOSTOMY CLOSURE N/A 10/20/2017   Procedure: COLOSTOMY CLOSURE;  Surgeon: [name], MD;  Location: [facility] ORS;  Service: General;  Laterality: N/A;  . CYSTOSCOPY    . FLEXIBLE SIGMOIDOSCOPY N/A 02/06/2017   Procedure: FLEXIBLE SIGMOIDOSCOPY;  Surgeon: Christene Lye, MD;  Location: [facility] ENDOSCOPY;  Service: Endoscopy;  Laterality: N/A;  . PORTACATH PLACEMENT N/A 06/17/2018    Procedure: INSERTION PORT-A-CATH, WITH FLUOROSCOPY;  Surgeon: [name], MD;  Location: [facility] ORS;  Service: General;  Laterality: N/A;    Social History   Socioeconomic History  . Marital status: Married    Spouse name: Not on file  . Number of children: Not on file  . Years of education: Not on file  . Highest education level: Not on file  Occupational History  . Not on file  Tobacco Use  . Smoking status: Never Smoker  . Smokeless tobacco: Never Used  Substance and Sexual Activity  . Alcohol use: No  . Drug use: No  . Sexual activity: Not Currently  Other Topics Concern  . Not on file  Social History Narrative  . Not on file   Social Determinants of Health   Financial Resource Strain:   . Difficulty of Paying Living Expenses: Not on file  Food Insecurity:   . Worried About Charity fundraiser in the Last Year: Not on file  . Ran Out of Food in the Last Year: Not on file  Transportation Needs:   . Lack of Transportation (Medical): Not on file  . Lack of Transportation (Non-Medical): Not on file  Physical Activity:   . Days of Exercise per Week: Not on file  . Minutes of Exercise per Session: Not on file  Stress:   . Feeling of Stress : Not on file  Social Connections:   . Frequency of Communication with Friends and Family: Not on file  . Frequency of Social Gatherings  with Friends and Family: Not on file  . Attends Religious Services: Not on file  . Active Member of Clubs or Organizations: Not on file  . Attends Archivist Meetings: Not on file  . Marital Status: Not on file  Intimate Partner Violence:   . Fear of Current or Ex-Partner: Not on file  . Emotionally Abused: Not on file  . Physically Abused: Not on file  . Sexually Abused: Not on file    Family History  Problem Relation Age of Onset  . Diabetes Mother   . AAA (abdominal aortic aneurysm) Mother   . Coronary artery disease Mother   . Diabetes Father   . Coronary artery disease  Father   . Dementia Father   . Breast cancer Sister      Current Outpatient Medications:  .  acetaminophen (TYLENOL) 500 MG tablet, Take 500 mg by mouth every 6 (six) hours as needed (for pain.)., Disp: , Rfl:  .  aspirin EC 81 MG tablet, Take 81 mg by mouth daily., Disp: , Rfl:  .  Docusate Sodium (COLACE PO), Take 1 Dose by mouth as needed., Disp: , Rfl:  .  Dulaglutide 0.75 MG/0.5ML SOPN, Inject into the skin once a week. , Disp: , Rfl:  .  gabapentin (NEURONTIN) 300 MG capsule, TAKE 3 CAPSULES (900 MG TOTAL) BY MOUTH 2 (TWO) TIMES DAILY., Disp: 180 capsule, Rfl: 2 .  glimepiride (AMARYL) 2 MG tablet, Take 2 mg by mouth 2 (two) times daily before a meal. , Disp: , Rfl:  .  lidocaine-prilocaine (EMLA) cream, Place a small amount over cream over port 1 hour before each treatment, cover cream with saran wrap for clothing protection, Disp: 30 g, Rfl: 1 .  loratadine (CLARITIN) 10 MG tablet, Take 10 mg by mouth daily., Disp: , Rfl:  .  losartan (COZAAR) 25 MG tablet, Take 25 mg by mouth daily., Disp: , Rfl:  .  metFORMIN (GLUCOPHAGE) 1000 MG tablet, Take 1,000-1,500 tablets by mouth See admin instructions. TAKE 1 TABLET (1000 MG) BY MOUTH IN THE MORNING & TAKE 1.5 TABLETS (1500 MG) BY MOUTH AT SUPPER, Disp: , Rfl:  .  pravastatin (PRAVACHOL) 20 MG tablet, Take 20 mg by mouth daily with supper. , Disp: , Rfl:  .  triamcinolone (KENALOG) 0.025 % cream, Apply 1 application topically daily as needed (FOR EZCEMA)., Disp: 30 g, Rfl: 2 No current facility-administered medications for this visit.  Facility-Administered Medications Ordered in Other Visits:  .  0.9 %  sodium chloride infusion, , Intravenous, Continuous, Sindy Guadeloupe, MD, Stopped at 07/27/18 1037 .  heparin lock flush 100 unit/mL, 500 Units, Intravenous, Once, Sindy Guadeloupe, MD .  sodium chloride flush (NS) 0.9 % injection 10 mL, 10 mL, Intravenous, PRN, Sindy Guadeloupe, MD, 10 mL at 07/27/18 0920 .  sodium chloride flush (NS) 0.9 %  injection 10 mL, 10 mL, Intravenous, Once, Sindy Guadeloupe, MD  Physical exam:  Vitals:   11/29/19 0835  BP: 129/68  Pulse: 75  Resp: 16  Temp: (!) 97 F (36.1 C)  TempSrc: Tympanic  Weight: 189 lb (85.7 kg)   Physical Exam Constitutional:      General: He is not in acute distress. HENT:     Head: Normocephalic and atraumatic.  Eyes:     Pupils: Pupils are equal, round, and reactive to light.  Cardiovascular:     Rate and Rhythm: Normal rate and regular rhythm.     Heart sounds: Normal  heart sounds.  Pulmonary:     Effort: Pulmonary effort is normal.     Breath sounds: Normal breath sounds.  Abdominal:     General: Bowel sounds are normal.     Palpations: Abdomen is soft.  Musculoskeletal:     Cervical back: Normal range of motion.  Skin:    Comments: Irregular macular partly erythematous partly exfoliating rash noted over left proximal forearm  Neurological:     Mental Status: He is alert and oriented to person, place, and time.      CMP Latest Ref Rng & Units 11/29/2019  Glucose 70 - 99 mg/dL 210(H)  BUN 8 - 23 mg/dL 17  Creatinine 0.61 - 1.24 mg/dL 1.30(H)  Sodium 135 - 145 mmol/L 136  Potassium 3.5 - 5.1 mmol/L 4.4  Chloride 98 - 111 mmol/L 104  CO2 22 - 32 mmol/L 21(L)  Calcium 8.9 - 10.3 mg/dL 9.2  Total Protein 6.5 - 8.1 g/dL 7.1  Total Bilirubin 0.3 - 1.2 mg/dL 0.5  Alkaline Phos 38 - 126 U/L 76  AST 15 - 41 U/L 43(H)  ALT 0 - 44 U/L 39   CBC Latest Ref Rng & Units 11/29/2019  WBC 4.0 - 10.5 K/uL 8.9  Hemoglobin 13.0 - 17.0 g/dL 11.5(L)  Hematocrit 39.0 - 52.0 % 38.2(L)  Platelets 150 - 400 K/uL 199     Assessment and plan- Patient is a 79 y.o. male with stage IV colon cancer and lung metastases. He has had progression on FOLFOX Avastin chemotherapy.  He is here for on treatment assessment prior to cycle 7 of FOLFIRI Mvasi chemotherapy  Counts okay to proceed with cycle 7 of FOLFIRI Mvasi chemotherapy today.  He will come on day 3 for pump  disconnect and will also receive Udenyca given that he has developed chemo-induced neutropenia in the past.  We are currently doing chemotherapy every 3 weeks for better tolerance and to avoid worsening cytopenias.  I plan to repeat CT chest abdomen and pelvis with contrast in 2 weeks time.  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  Continue the same  Skin rash: Appears eczematous will prescribe Kenalog ointment  Hyperglycemia: Did discuss with him that his blood sugars have been running in the 200s and that he should discuss with Dr. Netty Starring to adjust his medications.  AKI on CKD: Patient has developed mild AKI but his creatinine has remained stable around 1.3 for the last 3 to 4 months.  Continue to monitor   Visit Diagnosis 1. Encounter for monoclonal antibody treatment for malignancy   2. Encounter for antineoplastic chemotherapy   3. 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[place])   4. Chronic kidney disease, unspecified CKD stage   5. Chemotherapy-induced peripheral neuropathy ([place])   6. Skin rash      Dr. Randa Evens, MD, MPH [facility] at [facility] [phone] 12/01/2019 9:14 AM

## 2019-12-06 ENCOUNTER — Other Ambulatory Visit: Payer: Self-pay

## 2019-12-06 ENCOUNTER — Ambulatory Visit
Admission: RE | Admit: 2019-12-06 | Discharge: 2019-12-06 | Disposition: A | Discharge status: Home / Self Care | Payer: Medicare Other | Source: Ambulatory Visit | Attending: Oncology | Admitting: Oncology

## 2019-12-06 DIAGNOSIS — R918 Other nonspecific abnormal finding of lung field: Secondary | ICD-10-CM | POA: Diagnosis not present

## 2019-12-06 DIAGNOSIS — I2510 Atherosclerotic heart disease of native coronary artery without angina pectoris: Secondary | ICD-10-CM | POA: Insufficient documentation

## 2019-12-06 DIAGNOSIS — K8020 Calculus of gallbladder without cholecystitis without obstruction: Secondary | ICD-10-CM | POA: Diagnosis not present

## 2019-12-06 DIAGNOSIS — C7800 Secondary malignant neoplasm of unspecified lung: Secondary | ICD-10-CM | POA: Diagnosis present

## 2019-12-06 DIAGNOSIS — I700 Atherosclerosis of aorta: Secondary | ICD-10-CM | POA: Insufficient documentation

## 2019-12-06 MED ORDER — IOHEXOL 300 MG/ML  SOLN
100.0000 mL | Freq: Once | INTRAMUSCULAR | Status: AC | PRN
  Administered 2019-12-06: 100 mL via INTRAVENOUS

## 2019-12-20 ENCOUNTER — Inpatient Hospital Stay: Payer: Medicare Other

## 2019-12-20 ENCOUNTER — Inpatient Hospital Stay (HOSPITAL_BASED_OUTPATIENT_CLINIC_OR_DEPARTMENT_OTHER): Payer: Medicare Other | Admitting: Oncology

## 2019-12-20 ENCOUNTER — Other Ambulatory Visit: Payer: Self-pay

## 2019-12-20 ENCOUNTER — Encounter: Payer: Self-pay | Admitting: Oncology

## 2019-12-20 ENCOUNTER — Other Ambulatory Visit: Payer: Self-pay | Admitting: *Deleted

## 2019-12-20 ENCOUNTER — Inpatient Hospital Stay: Payer: Medicare Other | Attending: Oncology

## 2019-12-20 VITALS — BP 141/63 | HR 73 | Temp 96.5°F | Resp 16 | Wt 189.2 lb

## 2019-12-20 VITALS — BP 128/67 | HR 70

## 2019-12-20 DIAGNOSIS — E119 Type 2 diabetes mellitus without complications: Secondary | ICD-10-CM | POA: Diagnosis not present

## 2019-12-20 DIAGNOSIS — Z5189 Encounter for other specified aftercare: Secondary | ICD-10-CM | POA: Insufficient documentation

## 2019-12-20 DIAGNOSIS — C189 Malignant neoplasm of colon, unspecified: Secondary | ICD-10-CM | POA: Diagnosis not present

## 2019-12-20 DIAGNOSIS — C7800 Secondary malignant neoplasm of unspecified lung: Secondary | ICD-10-CM

## 2019-12-20 DIAGNOSIS — Z5111 Encounter for antineoplastic chemotherapy: Secondary | ICD-10-CM | POA: Insufficient documentation

## 2019-12-20 DIAGNOSIS — Z5112 Encounter for antineoplastic immunotherapy: Secondary | ICD-10-CM

## 2019-12-20 DIAGNOSIS — E785 Hyperlipidemia, unspecified: Secondary | ICD-10-CM | POA: Diagnosis not present

## 2019-12-20 DIAGNOSIS — R932 Abnormal findings on diagnostic imaging of liver and biliary tract: Secondary | ICD-10-CM

## 2019-12-20 DIAGNOSIS — E782 Mixed hyperlipidemia: Secondary | ICD-10-CM

## 2019-12-20 DIAGNOSIS — Z95828 Presence of other vascular implants and grafts: Secondary | ICD-10-CM

## 2019-12-20 DIAGNOSIS — C187 Malignant neoplasm of sigmoid colon: Secondary | ICD-10-CM | POA: Insufficient documentation

## 2019-12-20 LAB — CBC WITH DIFFERENTIAL/PLATELET
Abs Immature Granulocytes: 0.05 10*3/uL (ref 0.00–0.07)
Basophils Absolute: 0.1 10*3/uL (ref 0.0–0.1)
Basophils Relative: 1 %
Eosinophils Absolute: 0.2 10*3/uL (ref 0.0–0.5)
Eosinophils Relative: 3 %
HCT: 35.4 % — ABNORMAL LOW (ref 39.0–52.0)
Hemoglobin: 10.6 g/dL — ABNORMAL LOW (ref 13.0–17.0)
Immature Granulocytes: 1 %
Lymphocytes Relative: 27 %
Lymphs Abs: 2 10*3/uL (ref 0.7–4.0)
MCH: 29 pg (ref 26.0–34.0)
MCHC: 29.9 g/dL — ABNORMAL LOW (ref 30.0–36.0)
MCV: 97 fL (ref 80.0–100.0)
Monocytes Absolute: 0.6 10*3/uL (ref 0.1–1.0)
Monocytes Relative: 8 %
Neutro Abs: 4.4 10*3/uL (ref 1.7–7.7)
Neutrophils Relative %: 60 %
Platelets: 164 10*3/uL (ref 150–400)
RBC: 3.65 MIL/uL — ABNORMAL LOW (ref 4.22–5.81)
RDW: 18 % — ABNORMAL HIGH (ref 11.5–15.5)
WBC: 7.3 10*3/uL (ref 4.0–10.5)
nRBC: 0 % (ref 0.0–0.2)

## 2019-12-20 LAB — LIPID PANEL
Cholesterol: 161 mg/dL (ref 0–200)
HDL: 30 mg/dL — ABNORMAL LOW (ref >40)
LDL Cholesterol: UNDETERMINED mg/dL (ref 0–99)
Total CHOL/HDL Ratio: 5.4 RATIO
Triglycerides: 422 mg/dL — ABNORMAL HIGH (ref <150)
VLDL: UNDETERMINED mg/dL (ref 0–40)

## 2019-12-20 LAB — PROTEIN, URINE, RANDOM: Total Protein, Urine: 9 mg/dL

## 2019-12-20 LAB — COMPREHENSIVE METABOLIC PANEL
ALT: 37 U/L (ref 0–44)
AST: 39 U/L (ref 15–41)
Albumin: 3.7 g/dL (ref 3.5–5.0)
Alkaline Phosphatase: 86 U/L (ref 38–126)
Anion gap: 10 (ref 5–15)
BUN: 16 mg/dL (ref 8–23)
CO2: 21 mmol/L — ABNORMAL LOW (ref 22–32)
Calcium: 8.6 mg/dL — ABNORMAL LOW (ref 8.9–10.3)
Chloride: 105 mmol/L (ref 98–111)
Creatinine, Ser: 1.13 mg/dL (ref 0.61–1.24)
GFR calc Af Amer: >60 mL/min (ref >60)
GFR calc non Af Amer: >60 mL/min (ref >60)
Glucose, Bld: 281 mg/dL — ABNORMAL HIGH (ref 70–99)
Potassium: 4.3 mmol/L (ref 3.5–5.1)
Sodium: 136 mmol/L (ref 135–145)
Total Bilirubin: 0.6 mg/dL (ref 0.3–1.2)
Total Protein: 6.5 g/dL (ref 6.5–8.1)

## 2019-12-20 LAB — LDL CHOLESTEROL, DIRECT: Direct LDL: 82.5 mg/dL (ref 0–99)

## 2019-12-20 MED ORDER — SODIUM CHLORIDE 0.9 % IV SOLN
Freq: Once | INTRAVENOUS | Status: AC
  Filled 2019-12-20: qty 250

## 2019-12-20 MED ORDER — ATROPINE SULFATE 1 MG/ML IJ SOLN
0.5000 mg | Freq: Once | INTRAMUSCULAR | Status: AC | PRN
  Administered 2019-12-20: 0.5 mg via INTRAVENOUS
  Filled 2019-12-20: qty 1

## 2019-12-20 MED ORDER — PALONOSETRON HCL INJECTION 0.25 MG/5ML
0.2500 mg | Freq: Once | INTRAVENOUS | Status: AC
  Administered 2019-12-20: 09:00:00 0.25 mg via INTRAVENOUS
  Filled 2019-12-20: qty 5

## 2019-12-20 MED ORDER — LEUCOVORIN CALCIUM INJECTION 350 MG
402.0000 mg/m2 | Freq: Once | INTRAVENOUS | Status: AC
  Administered 2019-12-20: 800 mg via INTRAVENOUS
  Filled 2019-12-20: qty 25

## 2019-12-20 MED ORDER — IRINOTECAN HCL CHEMO INJECTION 100 MG/5ML
150.0000 mg/m2 | Freq: Once | INTRAVENOUS | Status: AC
  Administered 2019-12-20: 300 mg via INTRAVENOUS
  Filled 2019-12-20: qty 15

## 2019-12-20 MED ORDER — SODIUM CHLORIDE 0.9% FLUSH
10.0000 mL | Freq: Once | INTRAVENOUS | Status: AC
  Administered 2019-12-20: 08:00:00 10 mL via INTRAVENOUS
  Filled 2019-12-20: qty 10

## 2019-12-20 MED ORDER — SODIUM CHLORIDE 0.9 % IV SOLN
2400.0000 mg/m2 | INTRAVENOUS | Status: DC
  Administered 2019-12-20: 12:00:00 4800 mg via INTRAVENOUS
  Filled 2019-12-20: qty 96

## 2019-12-20 MED ORDER — DEXAMETHASONE SODIUM PHOSPHATE 10 MG/ML IJ SOLN
10.0000 mg | Freq: Once | INTRAMUSCULAR | Status: AC
  Administered 2019-12-20: 09:00:00 10 mg via INTRAVENOUS
  Filled 2019-12-20: qty 1

## 2019-12-20 MED ORDER — SODIUM CHLORIDE 0.9 % IV SOLN
5.0000 mg/kg | Freq: Once | INTRAVENOUS | Status: AC
  Administered 2019-12-20: 10:00:00 400 mg via INTRAVENOUS
  Filled 2019-12-20: qty 16

## 2019-12-20 MED ORDER — FLUOROURACIL CHEMO INJECTION 2.5 GM/50ML
400.0000 mg/m2 | Freq: Once | INTRAVENOUS | Status: AC
  Administered 2019-12-20: 12:00:00 800 mg via INTRAVENOUS
  Filled 2019-12-20: qty 16

## 2019-12-20 NOTE — Progress Notes (Signed)
Pt doing well. His neuropathy is worse in winter/cold weather. Otherwise no other issues.

## 2019-12-20 NOTE — Progress Notes (Signed)
7915: Per Dr. [name] okay to proceed with urine protein of 15 from 11/29/19.

## 2019-12-21 LAB — HEMOGLOBIN A1C
Hgb A1c MFr Bld: 7.5 % — ABNORMAL HIGH (ref 4.8–5.6)
Mean Plasma Glucose: 169 mg/dL

## 2019-12-22 ENCOUNTER — Other Ambulatory Visit: Payer: Self-pay

## 2019-12-22 ENCOUNTER — Telehealth: Payer: Self-pay

## 2019-12-22 ENCOUNTER — Inpatient Hospital Stay: Payer: Medicare Other

## 2019-12-22 VITALS — BP 144/76 | HR 68 | Temp 96.2°F | Resp 17

## 2019-12-22 DIAGNOSIS — Z5111 Encounter for antineoplastic chemotherapy: Secondary | ICD-10-CM | POA: Diagnosis not present

## 2019-12-22 DIAGNOSIS — C189 Malignant neoplasm of colon, unspecified: Secondary | ICD-10-CM

## 2019-12-22 DIAGNOSIS — C7800 Secondary malignant neoplasm of unspecified lung: Secondary | ICD-10-CM

## 2019-12-22 MED ORDER — HEPARIN SOD (PORK) LOCK FLUSH 100 UNIT/ML IV SOLN
500.0000 [IU] | Freq: Once | INTRAVENOUS | Status: AC | PRN
  Administered 2019-12-22: 12:00:00 500 [IU]
  Filled 2019-12-22: qty 5

## 2019-12-22 MED ORDER — PEGFILGRASTIM-CBQV 6 MG/0.6ML ~~LOC~~ SOSY
6.0000 mg | PREFILLED_SYRINGE | Freq: Once | SUBCUTANEOUS | Status: AC
  Administered 2019-12-22: 6 mg via SUBCUTANEOUS
  Filled 2019-12-22: qty 0.6

## 2019-12-22 NOTE — Telephone Encounter (Signed)
Patient's labs were faxed to his PCP (Dr. Netty Starring).

## 2019-12-23 NOTE — Progress Notes (Signed)
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 Patient is a 79 year old male who presented with evidence of bowel obstruction on 02/06/2017. At that time he had progressive abdominal distention and no bowel movement for about one week. CT abdomen showed an apple core lesion in the descending/sigmoid colon.  2. Patient underwent Hartmann's procedure for obstructing sigmoid colon mass on 02/06/2017. Preoperative CEA was 5.0  3. 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 It is 0 out of 15 lymph nodes were positive for malignancy. Perineural and lymphovascular invasion was present. Margins were negative.pT3N0. MMR stable.  4. Given that he had high risk stage II colon cancer including he presented with obstruction and has LVI and perineural invasion- adjuvant xeloda chemotherapy was recommended for 6 months.   5.Patient had evidence of hand foot syndrome after cycle 3. Cycle 4 delayed by 1 week. Topical urea cream prescribed  6. Patient completed 8 cycles of adjuvant xeloda in October 2018. polps seen in sigmoid and decending colon but negative for malignancy  7. Repeat Ct abdomen after 8 cycles showed no metastatic disease. 6 mm lung nodule noted in RLL  8.Patient had a repeat colonoscopy in October 2018 which showed polyps in his transverse and descending colon which were taken out and were negative for  high-grade dysplasia or malignancy. Patient subsequently underwent colostomy takedown procedure by Dr. Burt Knack  9. Repeat CT chest abdomen/ pelvis showed increase in the size of previously seen lung nodules largest being 1.4 cm consistent with metastatic disease. PET/CT showed mild uptake in 2 lung nodules. Others were not hypermetabolic. No other evidence of metastatic disease  10. Repeat lung biopsy consistent with colon adenocarcinoma. Comprehensive RAS panel testing showed mutant KRAS  11.Given the slow rate of growth of his colon cancer and desire to maintain his quality of life plan was to start chemotherapy after he gets a 34-monthbreak in summer. Repeat CT chest abdomen and pelvis in August 2019 showed mild increase in the size of his lung nodules. No new lung nodules are no new sites of distant metastatic disease.FOLFOX Avastin chemotherapy started on 06/22/2018.Patient completed 12 cycles of FOLFOX Avastin chemotherapy with continued response to his lung lesions. Oxaliplatin was dropped and patient is currently on 5-FU Avastin.  He did not have mild progression of her disease in lung nodules and was switched to second line FOLFIRI Mvasi   Interval history-overall patient feels well and denies any complaints at this time.  Peripheral neuropathy stable  ECOG PS- 1 Pain scale- 0   Review of systems- Review of Systems  Constitutional: Negative for chills, fever, malaise/fatigue and weight loss.  HENT: Negative for congestion, ear discharge and nosebleeds.   Eyes: Negative for blurred vision.  Respiratory: Negative for cough, hemoptysis, sputum production, shortness of breath and wheezing.   Cardiovascular: Negative for chest pain, palpitations, orthopnea and claudication.  Gastrointestinal: Negative for abdominal pain, blood in stool, constipation, diarrhea, heartburn, melena, nausea and vomiting.  Genitourinary: Negative for dysuria, flank pain, frequency, hematuria and  urgency.  Musculoskeletal: Negative for back pain, joint pain and myalgias.  Skin: Negative for rash.  Neurological: Negative for dizziness, tingling, focal weakness, seizures, weakness and headaches.  Endo/Heme/Allergies: Does not bruise/bleed easily.  Psychiatric/Behavioral: Negative for depression and suicidal ideas. The patient does not have insomnia.       No Known Allergies   Past Medical History:  Diagnosis Date  . Cancer ([place])    skin cancer-nose w graft,also shoulder- basal cell per pt  . Cataract    r eye  . Chronic kidney disease    kidney stones 20 y ago per pt  . Colon cancer ([place]) 2018   Surgical resection and chemo tx's.  . Diabetes mellitus without complication ([place])   . Eczema   . History of kidney stones   . Hyperlipidemia   . Hypertension   . Vertigo      Past Surgical History:  Procedure Laterality Date  . CATARACT EXTRACTION Left   . COLECTOMY WITH COLOSTOMY CREATION/HARTMANN PROCEDURE N/A 02/06/2017   Procedure: COLECTOMY WITH COLOSTOMY CREATION/HARTMANN PROCEDURE;  Surgeon: [name], MD;  Location: [facility] ORS;  Service: General;  Laterality: N/A;  . COLON SURGERY    . COLONOSCOPY WITH PROPOFOL N/A 09/04/2017   Procedure: COLONOSCOPY WITH PROPOFOL;  Surgeon: Jonathon Bellows, MD;  Location: [facility] ENDOSCOPY;  Service: Gastroenterology;  Laterality: N/A;  . COLOSTOMY CLOSURE N/A 10/20/2017   Procedure: COLOSTOMY CLOSURE;  Surgeon: [name], MD;  Location: [facility] ORS;  Service: General;  Laterality: N/A;  . CYSTOSCOPY    . FLEXIBLE SIGMOIDOSCOPY N/A 02/06/2017   Procedure: FLEXIBLE SIGMOIDOSCOPY;  Surgeon: Christene Lye, MD;  Location: [facility] ENDOSCOPY;  Service: Endoscopy;  Laterality: N/A;  . PORTACATH PLACEMENT N/A 06/17/2018   Procedure: INSERTION PORT-A-CATH, WITH FLUOROSCOPY;  Surgeon: [name], MD;  Location: [facility] ORS;  Service: General;  Laterality: N/A;    Social History   Socioeconomic History  . Marital status: Married     Spouse name: Not on file  . Number of children: Not on file  . Years of education: Not on file  . Highest education level: Not on file  Occupational History  . Not on file  Tobacco Use  . Smoking status: Never Smoker  . Smokeless tobacco: Never Used  Substance and Sexual Activity  . Alcohol use: No  . Drug use: No  . Sexual activity: Not Currently  Other Topics Concern  . Not on file  Social History Narrative  . Not on file   Social Determinants of Health   Financial Resource Strain:   . Difficulty of Paying Living Expenses: Not on file  Food Insecurity:   . Worried About Charity fundraiser in the Last Year: Not on file  . Ran Out of Food in the Last Year: Not on file  Transportation Needs:   . Lack of Transportation (Medical): Not on file  . Lack of Transportation (Non-Medical): Not on file  Physical Activity:   . Days of Exercise per Week: Not on file  . Minutes of Exercise per Session: Not on file  Stress:   . Feeling of Stress : Not on file  Social Connections:   . Frequency of Communication with Friends and Family: Not on file  . Frequency of Social Gatherings with Friends and Family: Not on file  . Attends Religious Services: Not on file  . Active Member of Clubs or Organizations: Not on file  . Attends Archivist Meetings: Not on file  .  Marital Status: Not on file  Intimate Partner Violence:   . Fear of Current or Ex-Partner: Not on file  . Emotionally Abused: Not on file  . Physically Abused: Not on file  . Sexually Abused: Not on file    Family History  Problem Relation Age of Onset  . Diabetes Mother   . AAA (abdominal aortic aneurysm) Mother   . Coronary artery disease Mother   . Diabetes Father   . Coronary artery disease Father   . Dementia Father   . Breast cancer Sister      Current Outpatient Medications:  .  acetaminophen (TYLENOL) 500 MG tablet, Take 500 mg by mouth every 6 (six) hours as needed (for pain.)., Disp: , Rfl:    .  aspirin EC 81 MG tablet, Take 81 mg by mouth daily., Disp: , Rfl:  .  Docusate Sodium (COLACE PO), Take 1 Dose by mouth as needed., Disp: , Rfl:  .  Dulaglutide 0.75 MG/0.5ML SOPN, Inject into the skin once a week. , Disp: , Rfl:  .  gabapentin (NEURONTIN) 300 MG capsule, TAKE 3 CAPSULES (900 MG TOTAL) BY MOUTH 2 (TWO) TIMES DAILY., Disp: 180 capsule, Rfl: 2 .  glimepiride (AMARYL) 2 MG tablet, Take 2 mg by mouth 2 (two) times daily before a meal. , Disp: , Rfl:  .  lidocaine-prilocaine (EMLA) cream, Place a small amount over cream over port 1 hour before each treatment, cover cream with saran wrap for clothing protection, Disp: 30 g, Rfl: 1 .  loratadine (CLARITIN) 10 MG tablet, Take 10 mg by mouth daily., Disp: , Rfl:  .  losartan (COZAAR) 25 MG tablet, Take 25 mg by mouth daily., Disp: , Rfl:  .  metFORMIN (GLUCOPHAGE) 1000 MG tablet, Take 1,000-1,500 tablets by mouth See admin instructions. TAKE 1 TABLET (1000 MG) BY MOUTH IN THE MORNING & TAKE 1.5 TABLETS (1500 MG) BY MOUTH AT SUPPER, Disp: , Rfl:  .  pravastatin (PRAVACHOL) 20 MG tablet, Take 20 mg by mouth daily with supper. , Disp: , Rfl:  .  triamcinolone (KENALOG) 0.025 % cream, Apply 1 application topically daily as needed (FOR EZCEMA)., Disp: 30 g, Rfl: 2 No current facility-administered medications for this visit.  Facility-Administered Medications Ordered in Other Visits:  .  0.9 %  sodium chloride infusion, , Intravenous, Continuous, Sindy Guadeloupe, MD, Stopped at 07/27/18 1037 .  heparin lock flush 100 unit/mL, 500 Units, Intravenous, Once, Sindy Guadeloupe, MD .  sodium chloride flush (NS) 0.9 % injection 10 mL, 10 mL, Intravenous, PRN, Sindy Guadeloupe, MD, 10 mL at 07/27/18 0920 .  sodium chloride flush (NS) 0.9 % injection 10 mL, 10 mL, Intravenous, Once, Sindy Guadeloupe, MD  Physical exam:  Vitals:   12/20/19 0835  BP: (!) 141/63  Pulse: 73  Resp: 16  Temp: (!) 96.5 F (35.8 C)  TempSrc: Tympanic  Weight: 189 lb 3.2  oz (85.8 kg)   Physical Exam Constitutional:      General: He is not in acute distress. HENT:     Head: Normocephalic and atraumatic.  Eyes:     Pupils: Pupils are equal, round, and reactive to light.  Cardiovascular:     Rate and Rhythm: Normal rate and regular rhythm.     Heart sounds: Normal heart sounds.  Pulmonary:     Effort: Pulmonary effort is normal.     Breath sounds: Normal breath sounds.  Abdominal:     General: Bowel sounds are normal.  Palpations: Abdomen is soft.  Musculoskeletal:     Cervical back: Normal range of motion.  Skin:    General: Skin is warm and dry.  Neurological:     Mental Status: He is alert and oriented to person, place, and time.      CMP Latest Ref Rng & Units 12/20/2019  Glucose 70 - 99 mg/dL 281(H)  BUN 8 - 23 mg/dL 16  Creatinine 0.61 - 1.24 mg/dL 1.13  Sodium 135 - 145 mmol/L 136  Potassium 3.5 - 5.1 mmol/L 4.3  Chloride 98 - 111 mmol/L 105  CO2 22 - 32 mmol/L 21(L)  Calcium 8.9 - 10.3 mg/dL 8.6(L)  Total Protein 6.5 - 8.1 g/dL 6.5  Total Bilirubin 0.3 - 1.2 mg/dL 0.6  Alkaline Phos 38 - 126 U/L 86  AST 15 - 41 U/L 39  ALT 0 - 44 U/L 37   CBC Latest Ref Rng & Units 12/20/2019  WBC 4.0 - 10.5 K/uL 7.3  Hemoglobin 13.0 - 17.0 g/dL 10.6(L)  Hematocrit 39.0 - 52.0 % 35.4(L)  Platelets 150 - 400 K/uL 164    No images are attached to the encounter.  CT Chest W Contrast  Result Date: 12/06/2019 CLINICAL DATA:  Restaging metastatic colon cancer. EXAM: CT CHEST, ABDOMEN, AND PELVIS WITH CONTRAST TECHNIQUE: Multidetector CT imaging of the chest, abdomen and pelvis was performed following the standard protocol during bolus administration of intravenous contrast. CONTRAST:  18m OMNIPAQUE IOHEXOL 300 MG/ML  SOLN COMPARISON:  08/11/2019 FINDINGS: CT CHEST FINDINGS Cardiovascular: Left Port-A-Cath tip: Lower SVC. Coronary, aortic arch, and branch vessel atherosclerotic vascular disease. Mediastinum/Nodes: Contrast medium in the esophagus  suggesting dysmotility or reflux. No current pathologic thoracic adenopathy. Lungs/Pleura: 0.8 by 0.7 cm right lower lobe pulmonary nodule on image 100/3, previously 0.9 by 0.7 cm by my measurement. 0.8 by 0.5 cm left upper lobe nodule on image 55/3, previously 0.9 by 0.7 cm by my measurement. 1.3 by 1.5 cm left lower lobe nodule on image 68/3, formerly 1.6 by 1.7 cm by my measurements. 1.5 by 0.9 cm lingular nodule on image 79/3, formerly 1.5 by 1.1 cm by my measurements. Musculoskeletal: Degenerative sternoclavicular arthropathy, right greater the left. Thoracic spondylosis with multilevel bridging spurring anteriorly. CT ABDOMEN PELVIS FINDINGS Hepatobiliary: Gallstones noted measuring up to 0.8 cm in long axis. 2.0 by 1.3 cm focus of hypodensity posteriorly just above the gallbladder fossa on image 58/2 has increased conspicuity compared to the prior exam where it was much less well-defined and smaller, while I favor local hepatic steatosis, a metastatic lesion is not totally excluded. Pancreas: Unremarkable Spleen: Unremarkable Adrenals/Urinary Tract: Unremarkable Stomach/Bowel: Postoperative findings along the proximal sigmoid colon. Vascular/Lymphatic: Aortoiliac atherosclerotic vascular disease. No pathologic adenopathy in the abdomen/pelvis. Reproductive: Dense dystrophic calcifications centrally in the prostate gland. Other: No supplemental non-categorized findings. Musculoskeletal: Unremarkable IMPRESSION: 1. Mild reduction in size of the pulmonary nodules. No new pulmonary nodules. 2. There is a progressive 2.0 by 1.3 cm focus of hypodensity posteriorly just above the gallbladder fossa. W hepatic protocol MRI with and without contrast would be the most specific way to further characterize this lesion. 3. Other imaging findings of potential clinical significance: Coronary atherosclerosis. Contrast medium in the esophagus suggesting dysmotility or reflux. Cholelithiasis. Degenerative sternoclavicular  arthropathy, right greater the left. Aortic Atherosclerosis (ICD10-I70.0). Electronically Signed   By: WVan ClinesM.D.   On: 12/06/2019 12:01   CT Abdomen Pelvis W Contrast  Result Date: 12/06/2019 CLINICAL DATA:  Restaging metastatic colon cancer. EXAM: CT  CHEST, ABDOMEN, AND PELVIS WITH CONTRAST TECHNIQUE: Multidetector CT imaging of the chest, abdomen and pelvis was performed following the standard protocol during bolus administration of intravenous contrast. CONTRAST:  120m OMNIPAQUE IOHEXOL 300 MG/ML  SOLN COMPARISON:  08/11/2019 FINDINGS: CT CHEST FINDINGS Cardiovascular: Left Port-A-Cath tip: Lower SVC. Coronary, aortic arch, and branch vessel atherosclerotic vascular disease. Mediastinum/Nodes: Contrast medium in the esophagus suggesting dysmotility or reflux. No current pathologic thoracic adenopathy. Lungs/Pleura: 0.8 by 0.7 cm right lower lobe pulmonary nodule on image 100/3, previously 0.9 by 0.7 cm by my measurement. 0.8 by 0.5 cm left upper lobe nodule on image 55/3, previously 0.9 by 0.7 cm by my measurement. 1.3 by 1.5 cm left lower lobe nodule on image 68/3, formerly 1.6 by 1.7 cm by my measurements. 1.5 by 0.9 cm lingular nodule on image 79/3, formerly 1.5 by 1.1 cm by my measurements. Musculoskeletal: Degenerative sternoclavicular arthropathy, right greater the left. Thoracic spondylosis with multilevel bridging spurring anteriorly. CT ABDOMEN PELVIS FINDINGS Hepatobiliary: Gallstones noted measuring up to 0.8 cm in long axis. 2.0 by 1.3 cm focus of hypodensity posteriorly just above the gallbladder fossa on image 58/2 has increased conspicuity compared to the prior exam where it was much less well-defined and smaller, while I favor local hepatic steatosis, a metastatic lesion is not totally excluded. Pancreas: Unremarkable Spleen: Unremarkable Adrenals/Urinary Tract: Unremarkable Stomach/Bowel: Postoperative findings along the proximal sigmoid colon. Vascular/Lymphatic: Aortoiliac  atherosclerotic vascular disease. No pathologic adenopathy in the abdomen/pelvis. Reproductive: Dense dystrophic calcifications centrally in the prostate gland. Other: No supplemental non-categorized findings. Musculoskeletal: Unremarkable IMPRESSION: 1. Mild reduction in size of the pulmonary nodules. No new pulmonary nodules. 2. There is a progressive 2.0 by 1.3 cm focus of hypodensity posteriorly just above the gallbladder fossa. W hepatic protocol MRI with and without contrast would be the most specific way to further characterize this lesion. 3. Other imaging findings of potential clinical significance: Coronary atherosclerosis. Contrast medium in the esophagus suggesting dysmotility or reflux. Cholelithiasis. Degenerative sternoclavicular arthropathy, right greater the left. Aortic Atherosclerosis (ICD10-I70.0). Electronically Signed   By: WVan ClinesM.D.   On: 12/06/2019 12:01     Assessment and plan- Patient is a 79y.o. male with stage IV colon cancer and lung metastases. He has had progression on FOLFOX Ziraberev chemotherapy.  He is here to discuss CT scan results and on for on treatment assessment prior to cycle 8 of FOLFIRI Mvasi chemotherapy  I personally reviewed CT chest abdomen and pelvis images independently and discussed findings with the patient and his 2 sons today.  Patient only has metastatic disease in his lungs and overall the size of the lung nodules have marginally decreased.  CT abdomen did show a hypodensity about the gallbladder fossa in the liver measuring 2 x 1.3 cm which was more conspicuous on today's exam is compared to prior which may be local hepatic steatosis but metastatic lesion was not excluded.  I will therefore obtain an MRI abdomen with and without contrast to characterize these hypodensities better.No evidence of new lesions in other areas.  He continues to respond to FOLFIRI Mvasi chemotherapy and will proceed with cycle 8 today.  He will come back on day  3 for pump disconnect and receive Udenyca on that day.  Patient has been on chemotherapy since August 2019 and is gradually getting more anemic.  We are therefore doing chemotherapy every 3 weeks instead of every 2 weeks.  Blood pressure is stable and urine protein remains trace to receive Ziraberev today  Also patient's  blood sugar is elevated at 181 although his hemoglobin A1c is 7.5.  I have asked the patient to keep a closer eye on his blood sugars and discussed further with Dr. Netty Starring.    Visit Diagnosis 1. 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[place])   2. Encounter for antineoplastic chemotherapy   3. Encounter for monoclonal antibody treatment for malignancy      Dr. Randa Evens, MD, MPH [facility] [phone] 12/23/2019 7:59 AM

## 2019-12-27 ENCOUNTER — Other Ambulatory Visit: Payer: Self-pay

## 2019-12-27 DIAGNOSIS — R110 Nausea: Secondary | ICD-10-CM

## 2019-12-27 MED ORDER — ONDANSETRON HCL 8 MG PO TABS: 8.0000 mg | ORAL_TABLET | Freq: Three times a day (TID) | ORAL | 0 refills | Status: DC | PRN

## 2019-12-29 ENCOUNTER — Other Ambulatory Visit: Payer: Medicare Other

## 2020-01-03 ENCOUNTER — Other Ambulatory Visit: Payer: Self-pay

## 2020-01-03 ENCOUNTER — Other Ambulatory Visit: Payer: Self-pay | Admitting: Oncology

## 2020-01-03 ENCOUNTER — Ambulatory Visit
Admission: RE | Admit: 2020-01-03 | Discharge: 2020-01-03 | Disposition: A | Discharge status: Home / Self Care | Payer: Medicare Other | Source: Ambulatory Visit | Attending: Oncology | Admitting: Oncology

## 2020-01-03 DIAGNOSIS — R932 Abnormal findings on diagnostic imaging of liver and biliary tract: Secondary | ICD-10-CM | POA: Insufficient documentation

## 2020-01-03 DIAGNOSIS — C189 Malignant neoplasm of colon, unspecified: Secondary | ICD-10-CM

## 2020-01-03 MED ORDER — GADOBUTROL 1 MMOL/ML IV SOLN
8.0000 mL | Freq: Once | INTRAVENOUS | Status: AC | PRN
  Administered 2020-01-03: 09:00:00 8 mL via INTRAVENOUS

## 2020-01-09 ENCOUNTER — Encounter: Payer: Self-pay | Admitting: Oncology

## 2020-01-09 ENCOUNTER — Other Ambulatory Visit: Payer: Self-pay

## 2020-01-09 NOTE — Progress Notes (Signed)
Patient stated that he received his second round of COVID-19 and he stated that he remembers going to bed and then woke up on the floor. He stated that he had fallen but doesn't recall what had happened but only that he knows that he felt a certain way due to the COVID-19 vaccine. Patient luckily didn't hurt himself or broke anything. Patient is doing well now.

## 2020-01-10 ENCOUNTER — Inpatient Hospital Stay (HOSPITAL_BASED_OUTPATIENT_CLINIC_OR_DEPARTMENT_OTHER): Payer: Medicare Other | Admitting: Oncology

## 2020-01-10 ENCOUNTER — Inpatient Hospital Stay: Payer: Medicare Other | Attending: Oncology

## 2020-01-10 ENCOUNTER — Inpatient Hospital Stay: Payer: Medicare Other

## 2020-01-10 VITALS — BP 155/60 | HR 75 | Temp 96.6°F | Resp 16 | Wt 189.8 lb

## 2020-01-10 DIAGNOSIS — C189 Malignant neoplasm of colon, unspecified: Secondary | ICD-10-CM

## 2020-01-10 DIAGNOSIS — Z5112 Encounter for antineoplastic immunotherapy: Secondary | ICD-10-CM | POA: Diagnosis not present

## 2020-01-10 DIAGNOSIS — T451X5A Adverse effect of antineoplastic and immunosuppressive drugs, initial encounter: Secondary | ICD-10-CM

## 2020-01-10 DIAGNOSIS — Z5111 Encounter for antineoplastic chemotherapy: Secondary | ICD-10-CM | POA: Diagnosis not present

## 2020-01-10 DIAGNOSIS — R7989 Other specified abnormal findings of blood chemistry: Secondary | ICD-10-CM

## 2020-01-10 DIAGNOSIS — C7800 Secondary malignant neoplasm of unspecified lung: Secondary | ICD-10-CM | POA: Diagnosis not present

## 2020-01-10 DIAGNOSIS — R945 Abnormal results of liver function studies: Secondary | ICD-10-CM | POA: Diagnosis not present

## 2020-01-10 DIAGNOSIS — G620 Drug-induced polyneuropathy: Secondary | ICD-10-CM

## 2020-01-10 DIAGNOSIS — C187 Malignant neoplasm of sigmoid colon: Secondary | ICD-10-CM | POA: Diagnosis present

## 2020-01-10 DIAGNOSIS — Z5189 Encounter for other specified aftercare: Secondary | ICD-10-CM | POA: Insufficient documentation

## 2020-01-10 LAB — COMPREHENSIVE METABOLIC PANEL
ALT: 82 U/L — ABNORMAL HIGH (ref 0–44)
AST: 119 U/L — ABNORMAL HIGH (ref 15–41)
Albumin: 3.8 g/dL (ref 3.5–5.0)
Alkaline Phosphatase: 79 U/L (ref 38–126)
Anion gap: 11 (ref 5–15)
BUN: 16 mg/dL (ref 8–23)
CO2: 21 mmol/L — ABNORMAL LOW (ref 22–32)
Calcium: 8.8 mg/dL — ABNORMAL LOW (ref 8.9–10.3)
Chloride: 109 mmol/L (ref 98–111)
Creatinine, Ser: 1.04 mg/dL (ref 0.61–1.24)
GFR calc Af Amer: >60 mL/min (ref >60)
GFR calc non Af Amer: >60 mL/min (ref >60)
Glucose, Bld: 239 mg/dL — ABNORMAL HIGH (ref 70–99)
Potassium: 4.1 mmol/L (ref 3.5–5.1)
Sodium: 141 mmol/L (ref 135–145)
Total Bilirubin: 0.5 mg/dL (ref 0.3–1.2)
Total Protein: 6.9 g/dL (ref 6.5–8.1)

## 2020-01-10 LAB — CBC WITH DIFFERENTIAL/PLATELET
Abs Immature Granulocytes: 0.03 10*3/uL (ref 0.00–0.07)
Basophils Absolute: 0.1 10*3/uL (ref 0.0–0.1)
Basophils Relative: 1 %
Eosinophils Absolute: 0.3 10*3/uL (ref 0.0–0.5)
Eosinophils Relative: 4 %
HCT: 36.7 % — ABNORMAL LOW (ref 39.0–52.0)
Hemoglobin: 10.8 g/dL — ABNORMAL LOW (ref 13.0–17.0)
Immature Granulocytes: 0 %
Lymphocytes Relative: 27 %
Lymphs Abs: 1.9 10*3/uL (ref 0.7–4.0)
MCH: 28.1 pg (ref 26.0–34.0)
MCHC: 29.4 g/dL — ABNORMAL LOW (ref 30.0–36.0)
MCV: 95.6 fL (ref 80.0–100.0)
Monocytes Absolute: 0.7 10*3/uL (ref 0.1–1.0)
Monocytes Relative: 9 %
Neutro Abs: 4.3 10*3/uL (ref 1.7–7.7)
Neutrophils Relative %: 59 %
Platelets: 177 10*3/uL (ref 150–400)
RBC: 3.84 MIL/uL — ABNORMAL LOW (ref 4.22–5.81)
RDW: 18.6 % — ABNORMAL HIGH (ref 11.5–15.5)
WBC: 7.3 10*3/uL (ref 4.0–10.5)
nRBC: 0 % (ref 0.0–0.2)

## 2020-01-10 LAB — PROTEIN, URINE, RANDOM: Total Protein, Urine: 73 mg/dL

## 2020-01-10 MED ORDER — SODIUM CHLORIDE 0.9 % IV SOLN
2400.0000 mg/m2 | INTRAVENOUS | Status: DC
  Administered 2020-01-10: 4800 mg via INTRAVENOUS
  Filled 2020-01-10: qty 96

## 2020-01-10 MED ORDER — SODIUM CHLORIDE 0.9 % IV SOLN
150.0000 mg/m2 | Freq: Once | INTRAVENOUS | Status: AC
  Administered 2020-01-10: 300 mg via INTRAVENOUS
  Filled 2020-01-10: qty 15

## 2020-01-10 MED ORDER — SODIUM CHLORIDE 0.9 % IV SOLN
402.0000 mg/m2 | Freq: Once | INTRAVENOUS | Status: AC
  Administered 2020-01-10: 10:00:00 800 mg via INTRAVENOUS
  Filled 2020-01-10: qty 25

## 2020-01-10 MED ORDER — SODIUM CHLORIDE 0.9 % IV SOLN
Freq: Once | INTRAVENOUS | Status: AC
  Filled 2020-01-10: qty 250

## 2020-01-10 MED ORDER — PALONOSETRON HCL INJECTION 0.25 MG/5ML
0.2500 mg | Freq: Once | INTRAVENOUS | Status: AC
  Administered 2020-01-10: 0.25 mg via INTRAVENOUS
  Filled 2020-01-10: qty 5

## 2020-01-10 MED ORDER — FLUOROURACIL CHEMO INJECTION 2.5 GM/50ML
400.0000 mg/m2 | Freq: Once | INTRAVENOUS | Status: AC
  Administered 2020-01-10: 800 mg via INTRAVENOUS
  Filled 2020-01-10: qty 16

## 2020-01-10 MED ORDER — DEXAMETHASONE SODIUM PHOSPHATE 10 MG/ML IJ SOLN
10.0000 mg | Freq: Once | INTRAMUSCULAR | Status: AC
  Administered 2020-01-10: 10 mg via INTRAVENOUS
  Filled 2020-01-10: qty 1

## 2020-01-10 MED ORDER — ATROPINE SULFATE 1 MG/ML IJ SOLN
0.5000 mg | Freq: Once | INTRAMUSCULAR | Status: AC | PRN
  Administered 2020-01-10: 0.5 mg via INTRAVENOUS
  Filled 2020-01-10: qty 1

## 2020-01-10 MED ORDER — SODIUM CHLORIDE 0.9 % IV SOLN
5.0000 mg/kg | Freq: Once | INTRAVENOUS | Status: AC
  Administered 2020-01-10: 400 mg via INTRAVENOUS
  Filled 2020-01-10: qty 16

## 2020-01-10 NOTE — Progress Notes (Signed)
AST and ALT elevated outside parameters in treatment plan. Per Dr [name] may proceed with treatment

## 2020-01-10 NOTE — Progress Notes (Signed)
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 Patient is a 79 year old male who presented with evidence of bowel obstruction on 02/06/2017. At that time he had progressive abdominal distention and no bowel movement for about one week. CT abdomen showed an apple core lesion in the descending/sigmoid colon.  2. Patient underwent Hartmann's procedure for obstructing sigmoid colon mass on 02/06/2017. Preoperative CEA was 5.0  3. 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 It is 0 out of 15 lymph nodes were positive for malignancy. Perineural and lymphovascular invasion was present. Margins were negative.pT3N0. MMR stable.  4. Given that he had high risk stage II colon cancer including he presented with obstruction and has LVI and perineural invasion- adjuvant xeloda chemotherapy was recommended for 6 months.   5.Patient had evidence of hand foot syndrome after cycle 3. Cycle 4 delayed by 1 week. Topical urea cream prescribed  6. Patient completed 8 cycles of adjuvant xeloda in October 2018. polps seen in sigmoid and decending colon but negative for malignancy  7. Repeat Ct abdomen after 8 cycles showed no metastatic disease. 6 mm lung nodule noted in RLL  8.Patient had a repeat colonoscopy in  October 2018 which showed polyps in his transverse and descending colon which were taken out and were negative for high-grade dysplasia or malignancy. Patient subsequently underwent colostomy takedown procedure by Dr. Burt Knack  9. Repeat CT chest abdomen/ pelvis showed increase in the size of previously seen lung nodules largest being 1.4 cm consistent with metastatic disease. PET/CT showed mild uptake in 2 lung nodules. Others were not hypermetabolic. No other evidence of metastatic disease  10. Repeat lung biopsy consistent with colon adenocarcinoma. Comprehensive RAS panel testing showed mutant KRAS  11.Given the slow rate of growth of his colon cancer and desire to maintain his quality of life plan was to start chemotherapy after he gets a 69-monthbreak in summer. Repeat CT chest abdomen and pelvis in August 2019 showed mild increase in the size of his lung nodules. No new lung nodules are no new sites of distant metastatic disease.FOLFOX Avastin chemotherapy started on 06/22/2018.Patient completed 12 cycles of FOLFOX Avastin chemotherapy with continued response to his lung lesions. Oxaliplatin was dropped and patient is currently on 5-FU Avastin.He did not have mild progression of her disease in lung nodules and was switched to second line FOLFIRI Mvasi   Interval history-after receiving the modern vaccine second dose patient had a syncopal episode at home at night.  He does not remember exactly what happened but he was on the floor in the morning when his family came to check on him.  Subsequently he felt weak for a few days and now is back to feeling at his baseline.  He denies any specific complaints today.  Peripheral neuropathy is stable ECOG PS- 1 Pain scale- 0  Review of systems- Review of Systems  Constitutional: Negative for chills, fever, malaise/fatigue  and weight loss.  HENT: Negative for congestion, ear discharge and nosebleeds.   Eyes: Negative for blurred vision.    Respiratory: Negative for cough, hemoptysis, sputum production, shortness of breath and wheezing.   Cardiovascular: Negative for chest pain, palpitations, orthopnea and claudication.  Gastrointestinal: Negative for abdominal pain, blood in stool, constipation, diarrhea, heartburn, melena, nausea and vomiting.  Genitourinary: Negative for dysuria, flank pain, frequency, hematuria and urgency.  Musculoskeletal: Negative for back pain, joint pain and myalgias.  Skin: Negative for rash.  Neurological: Negative for dizziness, tingling, focal weakness, seizures, weakness and headaches.  Endo/Heme/Allergies: Does not bruise/bleed easily.  Psychiatric/Behavioral: Negative for depression and suicidal ideas. The patient does not have insomnia.       No Known Allergies   Past Medical History:  Diagnosis Date  . Cancer ([place])    skin cancer-nose w graft,also shoulder- basal cell per pt  . Cataract    r eye  . Chronic kidney disease    kidney stones 20 y ago per pt  . Colon cancer ([place]) 2018   Surgical resection and chemo tx's.  . Diabetes mellitus without complication ([place])   . Eczema   . History of kidney stones   . Hyperlipidemia   . Hypertension   . Vertigo      Past Surgical History:  Procedure Laterality Date  . CATARACT EXTRACTION Left   . COLECTOMY WITH COLOSTOMY CREATION/HARTMANN PROCEDURE N/A 02/06/2017   Procedure: COLECTOMY WITH COLOSTOMY CREATION/HARTMANN PROCEDURE;  Surgeon: [name], MD;  Location: [facility] ORS;  Service: General;  Laterality: N/A;  . COLON SURGERY    . COLONOSCOPY WITH PROPOFOL N/A 09/04/2017   Procedure: COLONOSCOPY WITH PROPOFOL;  Surgeon: Jonathon Bellows, MD;  Location: [facility] ENDOSCOPY;  Service: Gastroenterology;  Laterality: N/A;  . COLOSTOMY CLOSURE N/A 10/20/2017   Procedure: COLOSTOMY CLOSURE;  Surgeon: [name], MD;  Location: [facility] ORS;  Service: General;  Laterality: N/A;  . CYSTOSCOPY    . FLEXIBLE SIGMOIDOSCOPY N/A 02/06/2017    Procedure: FLEXIBLE SIGMOIDOSCOPY;  Surgeon: Christene Lye, MD;  Location: [facility] ENDOSCOPY;  Service: Endoscopy;  Laterality: N/A;  . PORTACATH PLACEMENT N/A 06/17/2018   Procedure: INSERTION PORT-A-CATH, WITH FLUOROSCOPY;  Surgeon: [name], MD;  Location: [facility] ORS;  Service: General;  Laterality: N/A;    Social History   Socioeconomic History  . Marital status: Married    Spouse name: Not on file  . Number of children: Not on file  . Years of education: Not on file  . Highest education level: Not on file  Occupational History  . Not on file  Tobacco Use  . Smoking status: Never Smoker  . Smokeless tobacco: Never Used  Substance and Sexual Activity  . Alcohol use: No  . Drug use: No  . Sexual activity: Not Currently  Other Topics Concern  . Not on file  Social History Narrative  . Not on file   Social Determinants of Health   Financial Resource Strain:   . Difficulty of Paying Living Expenses: Not on file  Food Insecurity:   . Worried About Charity fundraiser in the Last Year: Not on file  . Ran Out of Food in the Last Year: Not on file  Transportation Needs:   . Lack of Transportation (Medical): Not on file  . Lack of Transportation (Non-Medical): Not on file  Physical Activity:   . Days of Exercise per Week: Not on file  . Minutes of Exercise per Session: Not on file  Stress:   . Feeling of Stress : Not on file  Social Connections:   . Frequency of Communication with Friends and Family: Not on file  . Frequency of Social Gatherings with Friends and Family: Not on file  . Attends Religious Services: Not on file  . Active Member of Clubs or Organizations: Not on file  . Attends Archivist Meetings: Not on file  . Marital Status: Not on file  Intimate Partner Violence:   . Fear of Current or Ex-Partner: Not on file  . Emotionally Abused: Not on file  . Physically Abused: Not on file  . Sexually Abused: Not on file    Family History    Problem Relation Age of Onset  . Diabetes Mother   . AAA (abdominal aortic aneurysm) Mother   . Coronary artery disease Mother   . Diabetes Father   . Coronary artery disease Father   . Dementia Father   . Breast cancer Sister      Current Outpatient Medications:  .  acetaminophen (TYLENOL) 500 MG tablet, Take 500 mg by mouth every 6 (six) hours as needed (for pain.)., Disp: , Rfl:  .  aspirin EC 81 MG tablet, Take 81 mg by mouth daily., Disp: , Rfl:  .  Docusate Sodium (COLACE PO), Take 1 Dose by mouth as needed., Disp: , Rfl:  .  Dulaglutide 0.75 MG/0.5ML SOPN, Inject into the skin once a week. , Disp: , Rfl:  .  gabapentin (NEURONTIN) 300 MG capsule, TAKE 3 CAPSULES (900 MG TOTAL) BY MOUTH 2 (TWO) TIMES DAILY., Disp: 180 capsule, Rfl: 2 .  glimepiride (AMARYL) 2 MG tablet, Take 2 mg by mouth 2 (two) times daily before a meal. , Disp: , Rfl:  .  lidocaine-prilocaine (EMLA) cream, Place a small amount over cream over port 1 hour before each treatment, cover cream with saran wrap for clothing protection, Disp: 30 g, Rfl: 1 .  loratadine (CLARITIN) 10 MG tablet, Take 10 mg by mouth daily., Disp: , Rfl:  .  losartan (COZAAR) 25 MG tablet, Take 25 mg by mouth daily., Disp: , Rfl:  .  metFORMIN (GLUCOPHAGE) 1000 MG tablet, Take 1,000-1,500 tablets by mouth See admin instructions. TAKE 1 TABLET (1000 MG) BY MOUTH IN THE MORNING & TAKE 1.5 TABLETS (1500 MG) BY MOUTH AT SUPPER, Disp: , Rfl:  .  pravastatin (PRAVACHOL) 20 MG tablet, Take 20 mg by mouth daily with supper. , Disp: , Rfl:  .  triamcinolone (KENALOG) 0.025 % cream, Apply 1 application topically daily as needed (FOR EZCEMA)., Disp: 30 g, Rfl: 2 .  ondansetron (ZOFRAN) 8 MG tablet, Take 1 tablet (8 mg total) by mouth every 8 (eight) hours as needed for nausea or vomiting. (Patient not taking: Reported on 01/09/2020), Disp: 20 tablet, Rfl: 0 No current facility-administered medications for this visit.  Facility-Administered Medications  Ordered in Other Visits:  .  0.9 %  sodium chloride infusion, , Intravenous, Continuous, Sindy Guadeloupe, MD, Stopped at 07/27/18 1037 .  fluorouracil (ADRUCIL) 4,800 mg in sodium chloride 0.9 % 54 mL chemo infusion, 2,400 mg/m2 (Treatment Plan Recorded), Intravenous, 1 day or 1 dose, Sindy Guadeloupe, MD, 4,800 mg at 01/10/20 1150 .  heparin lock flush 100 unit/mL, 500 Units, Intravenous, Once, Sindy Guadeloupe, MD .  sodium chloride flush (NS) 0.9 % injection 10 mL, 10 mL, Intravenous, PRN, Sindy Guadeloupe, MD, 10 mL at 07/27/18 0920 .  sodium chloride flush (NS) 0.9 % injection 10  mL, 10 mL, Intravenous, Once, Sindy Guadeloupe, MD  Physical exam:  Vitals:   01/10/20 0827  BP: (!) 155/60  Pulse: 75  Resp: 16  Temp: (!) 96.6 F (35.9 C)  TempSrc: Tympanic  Weight: 189 lb 12.8 oz (86.1 kg)   Physical Exam HENT:     Head: Normocephalic and atraumatic.  Cardiovascular:     Rate and Rhythm: Normal rate and regular rhythm.     Heart sounds: Normal heart sounds.  Pulmonary:     Effort: Pulmonary effort is normal.  Skin:    General: Skin is warm and dry.  Neurological:     Mental Status: He is alert and oriented to person, place, and time.      CMP Latest Ref Rng & Units 01/10/2020  Glucose 70 - 99 mg/dL 239(H)  BUN 8 - 23 mg/dL 16  Creatinine 0.61 - 1.24 mg/dL 1.04  Sodium 135 - 145 mmol/L 141  Potassium 3.5 - 5.1 mmol/L 4.1  Chloride 98 - 111 mmol/L 109  CO2 22 - 32 mmol/L 21(L)  Calcium 8.9 - 10.3 mg/dL 8.8(L)  Total Protein 6.5 - 8.1 g/dL 6.9  Total Bilirubin 0.3 - 1.2 mg/dL 0.5  Alkaline Phos 38 - 126 U/L 79  AST 15 - 41 U/L 119(H)  ALT 0 - 44 U/L 82(H)   CBC Latest Ref Rng & Units 01/10/2020  WBC 4.0 - 10.5 K/uL 7.3  Hemoglobin 13.0 - 17.0 g/dL 10.8(L)  Hematocrit 39.0 - 52.0 % 36.7(L)  Platelets 150 - 400 K/uL 177    No images are attached to the encounter.  MR Abdomen W Wo Contrast  Result Date: 01/03/2020 CLINICAL DATA:  Metastatic colon cancer. Increasing  hypoattenuating lesion along the gallbladder fossa identified on recent CT scan of 12/06/2019. EXAM: MRI ABDOMEN WITHOUT AND WITH CONTRAST TECHNIQUE: Multiplanar multisequence MR imaging of the abdomen was performed both before and after the administration of intravenous contrast. CONTRAST:  70m GADAVIST GADOBUTROL 1 MMOL/ML IV SOLN COMPARISON:  CT scan 12/06/2019 FINDINGS: Lower chest: Unremarkable. Hepatobiliary: 8 mm simple cyst identified posterior hepatic dome. The 2.0 x 1.3 cm lesion identified in the subcapsular aspect of posterior segment IV on the recent CT scan is again identified on today's MRI. This lesion shows subtly increased signal intensity on precontrast non-fat suppressed T1 imaging and shows marked loss of signal intensity on out of phase T1 imaging, consistent with intralesional lipid content, most consistent with focal fatty deposition within the liver parenchyma. A second small area of focal fatty deposition is seen along the falciform ligament just lateral and cranial to this dominant area. Neither region restricts diffusion and no suspicious enhancement after IV contrast administration. No worrisome enhancing lesion identified within the liver parenchyma to suggest metastatic involvement. Gallstones measure up to about 7 mm maximum size. No intra or extrahepatic biliary duct dilatation. No findings to suggest choledocholithiasis. Pancreas: No focal mass lesion. No dilatation of the main duct. No intraparenchymal cyst. No peripancreatic edema. Spleen:  No splenomegaly. No focal mass lesion. Adrenals/Urinary Tract: No adrenal nodule or mass. Tiny cyst identified interpolar left kidney. Right kidney unremarkable. Stomach/Bowel: Stomach is unremarkable. No gastric wall thickening. No evidence of outlet obstruction. Duodenum is normally positioned as is the ligament of Treitz. No small bowel or colonic dilatation within the visualized abdomen. Vascular/Lymphatic: No abdominal aortic aneurysm. No  abdominal lymphadenopathy. Other:  No intraperitoneal free fluid. Musculoskeletal: No abnormal marrow enhancement within the visualized bony anatomy. IMPRESSION: 1. 2.0 x 1.3 cm hepatic  lesion identified in the subcapsular aspect of posterior segment IV on recent CT scan is most compatible with focal fatty deposition. There is a second smaller adjacent subcapsular focus of focal fat identified also present in segment IV. No suspicious liver lesion to raise concern for metastatic involvement. 2. Cholelithiasis. 3. Tiny hepatic and left renal cysts. Electronically Signed   By: Misty Stanley M.D.   On: 01/03/2020 13:08     Assessment and plan- Patient is a 79 y.o. male  with stage IV colon cancer and lung metastases. He has had progression on FOLFOX Ziraberev chemotherapy.    He is here for on treatment assessment prior to cycle 9 of FOLFIRI Mvasi chemotherapy  White cell count and platelets are normal.  H&H is at his baseline.  He does have mildly abnormal LFTs elevated AST at 119 and ALT of 82.  He has not had prior abnormal LFTs before.  It is unclear if it is related to his recent syncopal episode following his second dose of [place] vaccine.  I will continue to monitor that but he will proceed with cycle 9 of FOLFIRI Mvasi chemotherapy today.  I will see him back in 3 weeks for cycle 10.  He comes on day 3 for pump disconnect and received Udenyca on that day.  He does have +1 urine protein.  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   Visit Diagnosis 1. Encounter for antineoplastic chemotherapy   2. Encounter for monoclonal  antibody treatment for malignancy   3. Abnormal LFTs   4. Chemotherapy-induced peripheral neuropathy ([place])      Dr. Randa Evens, MD, MPH [facility] [phone] 01/10/2020 12:53 PM

## 2020-01-12 ENCOUNTER — Inpatient Hospital Stay: Payer: Medicare Other

## 2020-01-12 ENCOUNTER — Other Ambulatory Visit: Payer: Self-pay

## 2020-01-12 DIAGNOSIS — C189 Malignant neoplasm of colon, unspecified: Secondary | ICD-10-CM

## 2020-01-12 DIAGNOSIS — C7800 Secondary malignant neoplasm of unspecified lung: Secondary | ICD-10-CM

## 2020-01-12 DIAGNOSIS — Z5111 Encounter for antineoplastic chemotherapy: Secondary | ICD-10-CM | POA: Diagnosis not present

## 2020-01-12 MED ORDER — SODIUM CHLORIDE 0.9% FLUSH
10.0000 mL | INTRAVENOUS | Status: DC | PRN
  Administered 2020-01-12: 12:00:00 10 mL
  Filled 2020-01-12: qty 10

## 2020-01-12 MED ORDER — HEPARIN SOD (PORK) LOCK FLUSH 100 UNIT/ML IV SOLN
INTRAVENOUS | Status: AC
  Filled 2020-01-12: qty 5

## 2020-01-12 MED ORDER — HEPARIN SOD (PORK) LOCK FLUSH 100 UNIT/ML IV SOLN
500.0000 [IU] | Freq: Once | INTRAVENOUS | Status: AC | PRN
  Administered 2020-01-12: 500 [IU]
  Filled 2020-01-12: qty 5

## 2020-01-12 MED ORDER — PEGFILGRASTIM-CBQV 6 MG/0.6ML ~~LOC~~ SOSY
6.0000 mg | PREFILLED_SYRINGE | Freq: Once | SUBCUTANEOUS | Status: AC
  Administered 2020-01-12: 12:00:00 6 mg via SUBCUTANEOUS
  Filled 2020-01-12: qty 0.6

## 2020-01-31 ENCOUNTER — Inpatient Hospital Stay: Payer: Medicare Other

## 2020-01-31 ENCOUNTER — Encounter: Payer: Self-pay | Admitting: Oncology

## 2020-01-31 ENCOUNTER — Inpatient Hospital Stay (HOSPITAL_BASED_OUTPATIENT_CLINIC_OR_DEPARTMENT_OTHER): Payer: Medicare Other | Admitting: Oncology

## 2020-01-31 VITALS — BP 142/53 | HR 70 | Temp 96.4°F | Resp 16 | Wt 188.0 lb

## 2020-01-31 DIAGNOSIS — C189 Malignant neoplasm of colon, unspecified: Secondary | ICD-10-CM

## 2020-01-31 DIAGNOSIS — Z5112 Encounter for antineoplastic immunotherapy: Secondary | ICD-10-CM

## 2020-01-31 DIAGNOSIS — C7800 Secondary malignant neoplasm of unspecified lung: Secondary | ICD-10-CM

## 2020-01-31 DIAGNOSIS — G620 Drug-induced polyneuropathy: Secondary | ICD-10-CM

## 2020-01-31 DIAGNOSIS — Z5111 Encounter for antineoplastic chemotherapy: Secondary | ICD-10-CM | POA: Diagnosis not present

## 2020-01-31 DIAGNOSIS — T451X5A Adverse effect of antineoplastic and immunosuppressive drugs, initial encounter: Secondary | ICD-10-CM

## 2020-01-31 LAB — COMPREHENSIVE METABOLIC PANEL
ALT: 27 U/L (ref 0–44)
AST: 35 U/L (ref 15–41)
Albumin: 3.9 g/dL (ref 3.5–5.0)
Alkaline Phosphatase: 75 U/L (ref 38–126)
Anion gap: 11 (ref 5–15)
BUN: 15 mg/dL (ref 8–23)
CO2: 20 mmol/L — ABNORMAL LOW (ref 22–32)
Calcium: 8.3 mg/dL — ABNORMAL LOW (ref 8.9–10.3)
Chloride: 106 mmol/L (ref 98–111)
Creatinine, Ser: 1.15 mg/dL (ref 0.61–1.24)
GFR calc Af Amer: >60 mL/min (ref >60)
GFR calc non Af Amer: >60 mL/min (ref >60)
Glucose, Bld: 197 mg/dL — ABNORMAL HIGH (ref 70–99)
Potassium: 4.1 mmol/L (ref 3.5–5.1)
Sodium: 137 mmol/L (ref 135–145)
Total Bilirubin: 0.6 mg/dL (ref 0.3–1.2)
Total Protein: 6.5 g/dL (ref 6.5–8.1)

## 2020-01-31 LAB — CBC WITH DIFFERENTIAL/PLATELET
Abs Immature Granulocytes: 0.02 10*3/uL (ref 0.00–0.07)
Basophils Absolute: 0.1 10*3/uL (ref 0.0–0.1)
Basophils Relative: 1 %
Eosinophils Absolute: 0.2 10*3/uL (ref 0.0–0.5)
Eosinophils Relative: 3 %
HCT: 34.5 % — ABNORMAL LOW (ref 39.0–52.0)
Hemoglobin: 10.6 g/dL — ABNORMAL LOW (ref 13.0–17.0)
Immature Granulocytes: 0 %
Lymphocytes Relative: 26 %
Lymphs Abs: 1.9 10*3/uL (ref 0.7–4.0)
MCH: 28.8 pg (ref 26.0–34.0)
MCHC: 30.7 g/dL (ref 30.0–36.0)
MCV: 93.8 fL (ref 80.0–100.0)
Monocytes Absolute: 0.6 10*3/uL (ref 0.1–1.0)
Monocytes Relative: 9 %
Neutro Abs: 4.3 10*3/uL (ref 1.7–7.7)
Neutrophils Relative %: 61 %
Platelets: 174 10*3/uL (ref 150–400)
RBC: 3.68 MIL/uL — ABNORMAL LOW (ref 4.22–5.81)
RDW: 18.6 % — ABNORMAL HIGH (ref 11.5–15.5)
WBC: 7.1 10*3/uL (ref 4.0–10.5)
nRBC: 0 % (ref 0.0–0.2)

## 2020-01-31 LAB — PROTEIN, URINE, RANDOM: Total Protein, Urine: 22 mg/dL

## 2020-01-31 MED ORDER — DEXAMETHASONE SODIUM PHOSPHATE 10 MG/ML IJ SOLN
10.0000 mg | Freq: Once | INTRAMUSCULAR | Status: AC
  Administered 2020-01-31: 10 mg via INTRAVENOUS
  Filled 2020-01-31: qty 1

## 2020-01-31 MED ORDER — SODIUM CHLORIDE 0.9 % IV SOLN
Freq: Once | INTRAVENOUS | Status: AC
  Filled 2020-01-31: qty 250

## 2020-01-31 MED ORDER — FLUOROURACIL CHEMO INJECTION 2.5 GM/50ML
400.0000 mg/m2 | Freq: Once | INTRAVENOUS | Status: AC
  Administered 2020-01-31: 800 mg via INTRAVENOUS
  Filled 2020-01-31: qty 16

## 2020-01-31 MED ORDER — SODIUM CHLORIDE 0.9 % IV SOLN
2400.0000 mg/m2 | INTRAVENOUS | Status: DC
  Administered 2020-01-31: 4800 mg via INTRAVENOUS
  Filled 2020-01-31: qty 96

## 2020-01-31 MED ORDER — ATROPINE SULFATE 1 MG/ML IJ SOLN
0.5000 mg | Freq: Once | INTRAMUSCULAR | Status: AC | PRN
  Administered 2020-01-31: 0.5 mg via INTRAVENOUS
  Filled 2020-01-31: qty 1

## 2020-01-31 MED ORDER — HEPARIN SOD (PORK) LOCK FLUSH 100 UNIT/ML IV SOLN
500.0000 [IU] | Freq: Once | INTRAVENOUS | Status: DC
  Filled 2020-01-31: qty 5

## 2020-01-31 MED ORDER — SODIUM CHLORIDE 0.9% FLUSH
10.0000 mL | Freq: Once | INTRAVENOUS | Status: DC
  Filled 2020-01-31: qty 10

## 2020-01-31 MED ORDER — LEUCOVORIN CALCIUM INJECTION 350 MG
402.0000 mg/m2 | Freq: Once | INTRAVENOUS | Status: AC
  Administered 2020-01-31: 800 mg via INTRAVENOUS
  Filled 2020-01-31: qty 25

## 2020-01-31 MED ORDER — PALONOSETRON HCL INJECTION 0.25 MG/5ML
0.2500 mg | Freq: Once | INTRAVENOUS | Status: AC
  Administered 2020-01-31: 0.25 mg via INTRAVENOUS
  Filled 2020-01-31: qty 5

## 2020-01-31 MED ORDER — IRINOTECAN HCL CHEMO INJECTION 100 MG/5ML
150.0000 mg/m2 | Freq: Once | INTRAVENOUS | Status: AC
  Administered 2020-01-31: 300 mg via INTRAVENOUS
  Filled 2020-01-31: qty 15

## 2020-01-31 MED ORDER — HEPARIN SOD (PORK) LOCK FLUSH 100 UNIT/ML IV SOLN
INTRAVENOUS | Status: AC
  Filled 2020-01-31: qty 5

## 2020-01-31 MED ORDER — SODIUM CHLORIDE 0.9 % IV SOLN
5.0000 mg/kg | Freq: Once | INTRAVENOUS | Status: AC
  Administered 2020-01-31: 400 mg via INTRAVENOUS
  Filled 2020-01-31: qty 16

## 2020-02-02 ENCOUNTER — Other Ambulatory Visit: Payer: Self-pay

## 2020-02-02 ENCOUNTER — Inpatient Hospital Stay: Payer: Medicare Other

## 2020-02-02 VITALS — BP 134/72 | HR 80 | Resp 18

## 2020-02-02 DIAGNOSIS — C7800 Secondary malignant neoplasm of unspecified lung: Secondary | ICD-10-CM

## 2020-02-02 DIAGNOSIS — Z5111 Encounter for antineoplastic chemotherapy: Secondary | ICD-10-CM | POA: Diagnosis not present

## 2020-02-02 DIAGNOSIS — C189 Malignant neoplasm of colon, unspecified: Secondary | ICD-10-CM

## 2020-02-02 MED ORDER — HEPARIN SOD (PORK) LOCK FLUSH 100 UNIT/ML IV SOLN
500.0000 [IU] | Freq: Once | INTRAVENOUS | Status: AC | PRN
  Administered 2020-02-02: 500 [IU]
  Filled 2020-02-02: qty 5

## 2020-02-02 MED ORDER — PEGFILGRASTIM-CBQV 6 MG/0.6ML ~~LOC~~ SOSY
6.0000 mg | PREFILLED_SYRINGE | Freq: Once | SUBCUTANEOUS | Status: AC
  Administered 2020-02-02: 6 mg via SUBCUTANEOUS
  Filled 2020-02-02: qty 0.6

## 2020-02-02 MED ORDER — SODIUM CHLORIDE 0.9% FLUSH
10.0000 mL | INTRAVENOUS | Status: DC | PRN
  Administered 2020-02-02: 10 mL
  Filled 2020-02-02: qty 10

## 2020-02-02 MED ORDER — HEPARIN SOD (PORK) LOCK FLUSH 100 UNIT/ML IV SOLN
INTRAVENOUS | Status: AC
  Filled 2020-02-02: qty 5

## 2020-02-02 NOTE — Progress Notes (Signed)
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 At that time he had progressive abdominal distention and no bowel movement for about one week. CT abdomen showed an apple core lesion in the descending/sigmoid colon.  2. Patient underwent Hartmann's procedure for obstructing sigmoid colon mass on 02/06/2017. Preoperative CEA was 5.0  3. 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 It is 0 out of 15 lymph nodes were positive for malignancy. Perineural and lymphovascular invasion was present. Margins were negative.pT3N0. MMR stable.  4. Given that he had high risk stage II colon cancer including he presented with obstruction and has LVI and perineural invasion- adjuvant xeloda chemotherapy was recommended for 6 months.   5.Patient had evidence of hand foot syndrome after cycle 3. Cycle 4 delayed by 1 week. Topical urea cream prescribed  6. Patient completed 8 cycles of adjuvant xeloda in October 2018. polps seen in sigmoid and decending colon but negative for malignancy  7. Repeat Ct abdomen after 8 cycles showed no metastatic disease. 6 mm lung nodule noted in RLL  8.Patient had a repeat colonoscopy in October 2018 which showed polyps in his transverse and  descending colon which were taken out and were negative for high-grade dysplasia or malignancy. Patient subsequently underwent colostomy takedown procedure by Dr. Burt Knack  9. Repeat CT chest abdomen/ pelvis showed increase in the size of previously seen lung nodules largest being 1.4 cm consistent with metastatic disease. PET/CT showed mild uptake in 2 lung nodules. Others were not hypermetabolic. No other evidence of metastatic disease  10. Repeat lung biopsy consistent with colon adenocarcinoma. Comprehensive RAS panel testing showed mutant KRAS  11.Given the slow rate of growth of his colon cancer and desire to maintain his quality of life plan was to start chemotherapy after he gets a 30-monthbreak in summer. Repeat CT chest abdomen and pelvis in August 2019 showed mild increase in the size of his lung nodules. No new lung nodules are no new sites of distant metastatic disease.FOLFOX Avastin chemotherapy started on 06/22/2018.Patient completed 12 cycles of FOLFOX Avastin chemotherapy with continued response to his lung lesions. Oxaliplatin was dropped and patient is currently on 5-FU Avastin.He did not have mild progression of her disease in lung nodules and was switched to second line FOLFIRI Mvasi  Interval history-he feels well overall.  Denies any significant nausea or vomiting.  Tolerating chemotherapy well without any significant diarrhea.  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  HENT: Negative for congestion, ear discharge and nosebleeds.   Eyes: Negative for blurred vision.  Respiratory: Negative for cough, hemoptysis, sputum production, shortness of breath and wheezing.   Cardiovascular: Negative for chest pain, palpitations, orthopnea and claudication.  Gastrointestinal: Negative for abdominal pain, blood  in stool,  constipation, diarrhea, heartburn, melena, nausea and vomiting.  Genitourinary: Negative for dysuria, flank pain, frequency, hematuria and urgency.  Musculoskeletal: Negative for back pain, joint pain and myalgias.  Skin: Negative for rash.  Neurological: Negative for dizziness, tingling, focal weakness, seizures, weakness and headaches.  Endo/Heme/Allergies: Does not bruise/bleed easily.  Psychiatric/Behavioral: Negative for depression and suicidal ideas. The patient does not have insomnia.       No Known Allergies   Past Medical History:  Diagnosis Date  . Cancer ([place])    skin cancer-nose w graft,also shoulder- basal cell per pt  . Cataract    r eye  . Chronic kidney disease    kidney stones 20 y ago per pt  . Colon cancer ([place]) 2018   Surgical resection and chemo tx's.  . Diabetes mellitus without complication ([place])   . Eczema   . History of kidney stones   . Hyperlipidemia   . Hypertension   . Vertigo      Past Surgical History:  Procedure Laterality Date  . CATARACT EXTRACTION Left   . COLECTOMY WITH COLOSTOMY CREATION/HARTMANN PROCEDURE N/A 02/06/2017   Procedure: COLECTOMY WITH COLOSTOMY CREATION/HARTMANN PROCEDURE;  Surgeon: [name], MD;  Location: [facility] ORS;  Service: General;  Laterality: N/A;  . COLON SURGERY    . COLONOSCOPY WITH PROPOFOL N/A 09/04/2017   Procedure: COLONOSCOPY WITH PROPOFOL;  Surgeon: Jonathon Bellows, MD;  Location: [facility] ENDOSCOPY;  Service: Gastroenterology;  Laterality: N/A;  . COLOSTOMY CLOSURE N/A 10/20/2017   Procedure: COLOSTOMY CLOSURE;  Surgeon: [name], MD;  Location: [facility] ORS;  Service: General;  Laterality: N/A;  . CYSTOSCOPY    . FLEXIBLE SIGMOIDOSCOPY N/A 02/06/2017   Procedure: FLEXIBLE SIGMOIDOSCOPY;  Surgeon: Christene Lye, MD;  Location: [facility] ENDOSCOPY;  Service: Endoscopy;  Laterality: N/A;  . PORTACATH PLACEMENT N/A 06/17/2018   Procedure: INSERTION PORT-A-CATH, WITH FLUOROSCOPY;  Surgeon: [name], MD;  Location: [facility] ORS;  Service: General;  Laterality: N/A;    Social History   Socioeconomic History  . Marital status: Married    Spouse name: Not on file  . Number of children: Not on file  . Years of education: Not on file  . Highest education level: Not on file  Occupational History  . Not on file  Tobacco Use  . Smoking status: Never Smoker  . Smokeless tobacco: Never Used  Substance and Sexual Activity  . Alcohol use: No  . Drug use: No  . Sexual activity: Not Currently  Other Topics Concern  . Not on file  Social History Narrative  . Not on file   Social Determinants of Health   Financial Resource Strain:   . Difficulty of Paying Living Expenses:   Food Insecurity:   . Worried About Charity fundraiser in the Last Year:   . Arboriculturist in the Last Year:   Transportation Needs:   . Film/video editor (Medical):   Marland Kitchen Lack of Transportation (Non-Medical):   Physical Activity:   . Days of Exercise per Week:   . Minutes of Exercise per Session:   Stress:   . Feeling of Stress :   Social Connections:   . Frequency of Communication with Friends and Family:   . Frequency of Social Gatherings with Friends and Family:   . Attends Religious Services:   . Active Member of Clubs or Organizations:   . Attends Archivist Meetings:   Marland Kitchen Marital Status:   Intimate  Partner Violence:   . Fear of Current or Ex-Partner:   . Emotionally Abused:   Marland Kitchen Physically Abused:   . Sexually Abused:     Family History  Problem Relation Age of Onset  . Diabetes Mother   . AAA (abdominal aortic aneurysm) Mother   . Coronary artery disease Mother   . Diabetes Father   . Coronary artery disease Father   . Dementia Father   . Breast cancer Sister      Current Outpatient Medications:  .  acetaminophen (TYLENOL) 500 MG tablet, Take 500 mg by mouth every 6 (six) hours as needed (for pain.)., Disp: , Rfl:  .  aspirin EC 81 MG tablet, Take 81 mg by mouth daily.,  Disp: , Rfl:  .  Docusate Sodium (COLACE PO), Take 1 Dose by mouth as needed., Disp: , Rfl:  .  Dulaglutide 0.75 MG/0.5ML SOPN, Inject into the skin once a week. , Disp: , Rfl:  .  gabapentin (NEURONTIN) 300 MG capsule, TAKE 3 CAPSULES (900 MG TOTAL) BY MOUTH 2 (TWO) TIMES DAILY., Disp: 180 capsule, Rfl: 2 .  glimepiride (AMARYL) 2 MG tablet, Take 2 mg by mouth 2 (two) times daily before a meal. , Disp: , Rfl:  .  lidocaine-prilocaine (EMLA) cream, Place a small amount over cream over port 1 hour before each treatment, cover cream with saran wrap for clothing protection, Disp: 30 g, Rfl: 1 .  loratadine (CLARITIN) 10 MG tablet, Take 10 mg by mouth daily., Disp: , Rfl:  .  losartan (COZAAR) 25 MG tablet, Take 25 mg by mouth daily., Disp: , Rfl:  .  metFORMIN (GLUCOPHAGE) 1000 MG tablet, Take 1,000-1,500 tablets by mouth See admin instructions. TAKE 1 TABLET (1000 MG) BY MOUTH IN THE MORNING & TAKE 1.5 TABLETS (1500 MG) BY MOUTH AT SUPPER, Disp: , Rfl:  .  ondansetron (ZOFRAN) 8 MG tablet, Take 1 tablet (8 mg total) by mouth every 8 (eight) hours as needed for nausea or vomiting., Disp: 20 tablet, Rfl: 0 .  pravastatin (PRAVACHOL) 20 MG tablet, Take 20 mg by mouth daily with supper. , Disp: , Rfl:  .  triamcinolone (KENALOG) 0.025 % cream, Apply 1 application topically daily as needed (FOR EZCEMA)., Disp: 30 g, Rfl: 2 No current facility-administered medications for this visit.  Facility-Administered Medications Ordered in Other Visits:  .  0.9 %  sodium chloride infusion, , Intravenous, Continuous, Sindy Guadeloupe, MD, Stopped at 07/27/18 1037 .  heparin lock flush 100 unit/mL, 500 Units, Intravenous, Once, Sindy Guadeloupe, MD .  sodium chloride flush (NS) 0.9 % injection 10 mL, 10 mL, Intravenous, PRN, Sindy Guadeloupe, MD, 10 mL at 07/27/18 0920 .  sodium chloride flush (NS) 0.9 % injection 10 mL, 10 mL, Intravenous, Once, Sindy Guadeloupe, MD  Physical exam:  Vitals:   01/31/20 0834  BP: (!)  142/53  Pulse: 70  Resp: 16  Temp: (!) 96.4 F (35.8 C)  TempSrc: Tympanic  Weight: 188 lb (85.3 kg)   Physical Exam HENT:     Head: Normocephalic and atraumatic.  Eyes:     Pupils: Pupils are equal, round, and reactive to light.  Cardiovascular:     Rate and Rhythm: Normal rate and regular rhythm.     Heart sounds: Normal heart sounds.  Pulmonary:     Effort: Pulmonary effort is normal.     Breath sounds: Normal breath sounds.  Abdominal:     General: Bowel sounds are normal.  Palpations: Abdomen is soft.  Musculoskeletal:     Cervical back: Normal range of motion.  Skin:    General: Skin is warm and dry.  Neurological:     Mental Status: He is alert and oriented to person, place, and time.      CMP Latest Ref Rng & Units 01/31/2020  Glucose 70 - 99 mg/dL 197(H)  BUN 8 - 23 mg/dL 15  Creatinine 0.61 - 1.24 mg/dL 1.15  Sodium 135 - 145 mmol/L 137  Potassium 3.5 - 5.1 mmol/L 4.1  Chloride 98 - 111 mmol/L 106  CO2 22 - 32 mmol/L 20(L)  Calcium 8.9 - 10.3 mg/dL 8.3(L)  Total Protein 6.5 - 8.1 g/dL 6.5  Total Bilirubin 0.3 - 1.2 mg/dL 0.6  Alkaline Phos 38 - 126 U/L 75  AST 15 - 41 U/L 35  ALT 0 - 44 U/L 27   CBC Latest Ref Rng & Units 01/31/2020  WBC 4.0 - 10.5 K/uL 7.1  Hemoglobin 13.0 - 17.0 g/dL 10.6(L)  Hematocrit 39.0 - 52.0 % 34.5(L)  Platelets 150 - 400 K/uL 174    No images are attached to the encounter.  MR Abdomen W Wo Contrast  Result Date: 01/03/2020 CLINICAL DATA:  Metastatic colon cancer. Increasing hypoattenuating lesion along the gallbladder fossa identified on recent CT scan of 12/06/2019. EXAM: MRI ABDOMEN WITHOUT AND WITH CONTRAST TECHNIQUE: Multiplanar multisequence MR imaging of the abdomen was performed both before and after the administration of intravenous contrast. CONTRAST:  28m GADAVIST GADOBUTROL 1 MMOL/ML IV SOLN COMPARISON:  CT scan 12/06/2019 FINDINGS: Lower chest: Unremarkable. Hepatobiliary: 8 mm simple cyst identified posterior  hepatic dome. The 2.0 x 1.3 cm lesion identified in the subcapsular aspect of posterior segment IV on the recent CT scan is again identified on today's MRI. This lesion shows subtly increased signal intensity on precontrast non-fat suppressed T1 imaging and shows marked loss of signal intensity on out of phase T1 imaging, consistent with intralesional lipid content, most consistent with focal fatty deposition within the liver parenchyma. A second small area of focal fatty deposition is seen along the falciform ligament just lateral and cranial to this dominant area. Neither region restricts diffusion and no suspicious enhancement after IV contrast administration. No worrisome enhancing lesion identified within the liver parenchyma to suggest metastatic involvement. Gallstones measure up to about 7 mm maximum size. No intra or extrahepatic biliary duct dilatation. No findings to suggest choledocholithiasis. Pancreas: No focal mass lesion. No dilatation of the main duct. No intraparenchymal cyst. No peripancreatic edema. Spleen:  No splenomegaly. No focal mass lesion. Adrenals/Urinary Tract: No adrenal nodule or mass. Tiny cyst identified interpolar left kidney. Right kidney unremarkable. Stomach/Bowel: Stomach is unremarkable. No gastric wall thickening. No evidence of outlet obstruction. Duodenum is normally positioned as is the ligament of Treitz. No small bowel or colonic dilatation within the visualized abdomen. Vascular/Lymphatic: No abdominal aortic aneurysm. No abdominal lymphadenopathy. Other:  No intraperitoneal free fluid. Musculoskeletal: No abnormal marrow enhancement within the visualized bony anatomy. IMPRESSION: 1. 2.0 x 1.3 cm hepatic lesion identified in the subcapsular aspect of posterior segment IV on recent CT scan is most compatible with focal fatty deposition. There is a second smaller adjacent subcapsular focus of focal fat identified also present in segment IV. No suspicious liver lesion to  raise concern for metastatic involvement. 2. Cholelithiasis. 3. Tiny hepatic and left renal cysts. Electronically Signed   By: EMisty StanleyM.D.   On: 01/03/2020 13:08     Assessment and  plan- Patient is a 79 y.o. male with stage IV colon cancer and lung metastases. He has had progression on FOLFOXZirabevchemotherapy. He is here for on treatment assessment prior to cycle 10 of FOLFIRI Zirabev chemotherapy  Counts okay to proceed with cycle 10 of FOLFIRI Zirabev chemotherapy today.  White cell count and platelets are normal.  He does come back on day 3 for pump disconnect and receives [place].  Hemoglobin is remaining stable between 10-11.  I will see him back in 3 weeks time with CBC with differential, CMP and urine protein.  Chemotherapy is being done every 3 weeks for better tolerance.  His random urine protein did come down to 22 from 73..  This is +1 and can be monitored without requiring a 24-hour urine protein  Abnormal LFTs: They have normalized now.  Unclear if it was related to second dose of Covid vaccine.  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 Encounter for monoclonal antibody treatment for malignancy   2. Encounter for antineoplastic chemotherapy   3. Malignant neoplasm metastatic to lung, unspecified laterality ([place])   4. 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[place])   5. Chemotherapy-induced peripheral neuropathy ([place])      Dr. Randa Evens, MD, MPH [facility] [phone] 02/02/2020 8:47 AM

## 2020-02-14 NOTE — Progress Notes (Signed)
Pharmacist Chemotherapy Monitoring - Follow Up Assessment    I verify that I have reviewed each item in the below checklist:  . Regimen for the patient is scheduled for the appropriate day and plan matches scheduled date. Marland Kitchen Appropriate non-routine labs are ordered dependent on drug ordered. . If applicable, additional medications reviewed and ordered per protocol based on lifetime cumulative doses and/or treatment regimen.   Plan for follow-up and/or issues identified: Yes . I-vent associated with next due treatment: No . MD and/or nursing notified: No  . Scheduled to be seen by md, need new orders  Miguel Flores K 02/14/2020 8:35 AM

## 2020-02-20 NOTE — Progress Notes (Signed)
Called patient no answer left message  

## 2020-02-21 ENCOUNTER — Other Ambulatory Visit: Payer: Self-pay

## 2020-02-21 ENCOUNTER — Encounter: Payer: Self-pay | Admitting: Oncology

## 2020-02-21 ENCOUNTER — Inpatient Hospital Stay: Payer: Medicare Other | Attending: Oncology

## 2020-02-21 ENCOUNTER — Inpatient Hospital Stay (HOSPITAL_BASED_OUTPATIENT_CLINIC_OR_DEPARTMENT_OTHER): Payer: Medicare Other | Admitting: Oncology

## 2020-02-21 ENCOUNTER — Inpatient Hospital Stay: Payer: Medicare Other

## 2020-02-21 VITALS — BP 136/65 | HR 68 | Temp 96.7°F | Resp 16 | Wt 190.4 lb

## 2020-02-21 DIAGNOSIS — G620 Drug-induced polyneuropathy: Secondary | ICD-10-CM

## 2020-02-21 DIAGNOSIS — Z5112 Encounter for antineoplastic immunotherapy: Secondary | ICD-10-CM

## 2020-02-21 DIAGNOSIS — C189 Malignant neoplasm of colon, unspecified: Secondary | ICD-10-CM

## 2020-02-21 DIAGNOSIS — E119 Type 2 diabetes mellitus without complications: Secondary | ICD-10-CM | POA: Diagnosis not present

## 2020-02-21 DIAGNOSIS — C7800 Secondary malignant neoplasm of unspecified lung: Secondary | ICD-10-CM | POA: Diagnosis not present

## 2020-02-21 DIAGNOSIS — Z5111 Encounter for antineoplastic chemotherapy: Secondary | ICD-10-CM | POA: Insufficient documentation

## 2020-02-21 DIAGNOSIS — C187 Malignant neoplasm of sigmoid colon: Secondary | ICD-10-CM | POA: Insufficient documentation

## 2020-02-21 DIAGNOSIS — Z5189 Encounter for other specified aftercare: Secondary | ICD-10-CM | POA: Diagnosis not present

## 2020-02-21 DIAGNOSIS — T451X5A Adverse effect of antineoplastic and immunosuppressive drugs, initial encounter: Secondary | ICD-10-CM

## 2020-02-21 LAB — COMPREHENSIVE METABOLIC PANEL
ALT: 42 U/L (ref 0–44)
AST: 35 U/L (ref 15–41)
Albumin: 3.7 g/dL (ref 3.5–5.0)
Alkaline Phosphatase: 90 U/L (ref 38–126)
Anion gap: 12 (ref 5–15)
BUN: 17 mg/dL (ref 8–23)
CO2: 20 mmol/L — ABNORMAL LOW (ref 22–32)
Calcium: 8.5 mg/dL — ABNORMAL LOW (ref 8.9–10.3)
Chloride: 103 mmol/L (ref 98–111)
Creatinine, Ser: 1.12 mg/dL (ref 0.61–1.24)
GFR calc Af Amer: >60 mL/min (ref >60)
GFR calc non Af Amer: >60 mL/min (ref >60)
Glucose, Bld: 189 mg/dL — ABNORMAL HIGH (ref 70–99)
Potassium: 4.4 mmol/L (ref 3.5–5.1)
Sodium: 135 mmol/L (ref 135–145)
Total Bilirubin: 0.7 mg/dL (ref 0.3–1.2)
Total Protein: 6.7 g/dL (ref 6.5–8.1)

## 2020-02-21 LAB — CBC WITH DIFFERENTIAL/PLATELET
Abs Immature Granulocytes: 0.05 10*3/uL (ref 0.00–0.07)
Basophils Absolute: 0.1 10*3/uL (ref 0.0–0.1)
Basophils Relative: 1 %
Eosinophils Absolute: 0.2 10*3/uL (ref 0.0–0.5)
Eosinophils Relative: 3 %
HCT: 35.2 % — ABNORMAL LOW (ref 39.0–52.0)
Hemoglobin: 10.8 g/dL — ABNORMAL LOW (ref 13.0–17.0)
Immature Granulocytes: 1 %
Lymphocytes Relative: 31 %
Lymphs Abs: 2 10*3/uL (ref 0.7–4.0)
MCH: 29.1 pg (ref 26.0–34.0)
MCHC: 30.7 g/dL (ref 30.0–36.0)
MCV: 94.9 fL (ref 80.0–100.0)
Monocytes Absolute: 0.5 10*3/uL (ref 0.1–1.0)
Monocytes Relative: 8 %
Neutro Abs: 3.6 10*3/uL (ref 1.7–7.7)
Neutrophils Relative %: 56 %
Platelets: 182 10*3/uL (ref 150–400)
RBC: 3.71 MIL/uL — ABNORMAL LOW (ref 4.22–5.81)
RDW: 18.6 % — ABNORMAL HIGH (ref 11.5–15.5)
WBC: 6.5 10*3/uL (ref 4.0–10.5)
nRBC: 0 % (ref 0.0–0.2)

## 2020-02-21 LAB — PROTEIN, URINE, RANDOM: Total Protein, Urine: 10 mg/dL

## 2020-02-21 MED ORDER — ATROPINE SULFATE 1 MG/ML IJ SOLN
0.5000 mg | Freq: Once | INTRAMUSCULAR | Status: AC | PRN
  Administered 2020-02-21: 0.5 mg via INTRAVENOUS
  Filled 2020-02-21: qty 1

## 2020-02-21 MED ORDER — SODIUM CHLORIDE 0.9 % IV SOLN
150.0000 mg/m2 | Freq: Once | INTRAVENOUS | Status: DC
  Filled 2020-02-21: qty 15

## 2020-02-21 MED ORDER — FLUOROURACIL CHEMO INJECTION 2.5 GM/50ML
400.0000 mg/m2 | Freq: Once | INTRAVENOUS | Status: AC
  Administered 2020-02-21: 800 mg via INTRAVENOUS
  Filled 2020-02-21: qty 16

## 2020-02-21 MED ORDER — SODIUM CHLORIDE 0.9 % IV SOLN: 150.0000 mg/m2 | Freq: Once | INTRAVENOUS | Status: DC

## 2020-02-21 MED ORDER — SODIUM CHLORIDE 0.9 % IV SOLN
150.0000 mg/m2 | Freq: Once | INTRAVENOUS | Status: AC
  Administered 2020-02-21: 300 mg via INTRAVENOUS
  Filled 2020-02-21: qty 15

## 2020-02-21 MED ORDER — SODIUM CHLORIDE 0.9 % IV SOLN
402.0000 mg/m2 | Freq: Once | INTRAVENOUS | Status: DC
  Administered 2020-02-21: 800 mg via INTRAVENOUS
  Filled 2020-02-21: qty 25

## 2020-02-21 MED ORDER — SODIUM CHLORIDE 0.9 % IV SOLN
2400.0000 mg/m2 | INTRAVENOUS | Status: DC
  Administered 2020-02-21: 4800 mg via INTRAVENOUS
  Filled 2020-02-21: qty 96

## 2020-02-21 MED ORDER — LEUCOVORIN CALCIUM INJECTION 350 MG: 402.0000 mg/m2 | Freq: Once | INTRAVENOUS | Status: DC

## 2020-02-21 MED ORDER — SODIUM CHLORIDE 0.9 % IV SOLN
800.0000 mg | Freq: Once | INTRAVENOUS | Status: AC
  Administered 2020-02-21: 800 mg via INTRAVENOUS
  Filled 2020-02-21: qty 40

## 2020-02-21 MED ORDER — SODIUM CHLORIDE 0.9 % IV SOLN
Freq: Once | INTRAVENOUS | Status: AC
  Filled 2020-02-21: qty 250

## 2020-02-21 MED ORDER — SODIUM CHLORIDE 0.9 % IV SOLN
5.0000 mg/kg | Freq: Once | INTRAVENOUS | Status: AC
  Administered 2020-02-21: 400 mg via INTRAVENOUS
  Filled 2020-02-21: qty 16

## 2020-02-21 MED ORDER — PALONOSETRON HCL INJECTION 0.25 MG/5ML
0.2500 mg | Freq: Once | INTRAVENOUS | Status: AC
  Administered 2020-02-21: 0.25 mg via INTRAVENOUS
  Filled 2020-02-21: qty 5

## 2020-02-21 MED ORDER — DEXAMETHASONE SODIUM PHOSPHATE 10 MG/ML IJ SOLN
10.0000 mg | Freq: Once | INTRAMUSCULAR | Status: AC
  Administered 2020-02-21: 10 mg via INTRAVENOUS
  Filled 2020-02-21: qty 1

## 2020-02-21 MED ORDER — IRINOTECAN HCL CHEMO INJECTION 100 MG/5ML: 150.0000 mg/m2 | Freq: Once | INTRAVENOUS | Status: DC

## 2020-02-23 ENCOUNTER — Other Ambulatory Visit: Payer: Self-pay

## 2020-02-23 ENCOUNTER — Inpatient Hospital Stay: Payer: Medicare Other

## 2020-02-23 VITALS — BP 153/68 | HR 69 | Temp 97.0°F

## 2020-02-23 DIAGNOSIS — Z5111 Encounter for antineoplastic chemotherapy: Secondary | ICD-10-CM | POA: Diagnosis not present

## 2020-02-23 DIAGNOSIS — C7800 Secondary malignant neoplasm of unspecified lung: Secondary | ICD-10-CM

## 2020-02-23 DIAGNOSIS — C189 Malignant neoplasm of colon, unspecified: Secondary | ICD-10-CM

## 2020-02-23 MED ORDER — HEPARIN SOD (PORK) LOCK FLUSH 100 UNIT/ML IV SOLN
INTRAVENOUS | Status: AC
  Filled 2020-02-23: qty 5

## 2020-02-23 MED ORDER — PEGFILGRASTIM-CBQV 6 MG/0.6ML ~~LOC~~ SOSY
6.0000 mg | PREFILLED_SYRINGE | Freq: Once | SUBCUTANEOUS | Status: AC
  Administered 2020-02-23: 6 mg via SUBCUTANEOUS
  Filled 2020-02-23: qty 0.6

## 2020-02-23 MED ORDER — SODIUM CHLORIDE 0.9% FLUSH
10.0000 mL | INTRAVENOUS | Status: DC | PRN
  Administered 2020-02-23: 10 mL
  Filled 2020-02-23: qty 10

## 2020-02-23 MED ORDER — HEPARIN SOD (PORK) LOCK FLUSH 100 UNIT/ML IV SOLN
500.0000 [IU] | Freq: Once | INTRAVENOUS | Status: AC | PRN
  Administered 2020-02-23: 500 [IU]
  Filled 2020-02-23: qty 5

## 2020-02-23 NOTE — Progress Notes (Signed)
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 At that time he had progressive abdominal distention and no bowel movement for about one week. CT abdomen showed an apple core lesion in the descending/sigmoid colon.  2. Patient underwent Hartmann's procedure for obstructing sigmoid colon mass on 02/06/2017. Preoperative CEA was 5.0  3. 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 It is 0 out of 15 lymph nodes were positive for malignancy. Perineural and lymphovascular invasion was present. Margins were negative.pT3N0. MMR stable.  4. Given that he had high risk stage II colon cancer including he presented with obstruction and has LVI and perineural invasion- adjuvant xeloda chemotherapy was recommended for 6 months.   5.Patient had evidence of hand foot syndrome after cycle 3. Cycle 4 delayed by 1 week. Topical urea cream prescribed  6. Patient completed 8 cycles of adjuvant xeloda in October 2018. polps seen in sigmoid and decending colon but negative for malignancy  7. Repeat Ct abdomen after 8 cycles showed no metastatic disease. 6 mm lung nodule noted in RLL  8.Patient had a repeat colonoscopy in October 2018 which showed polyps in his transverse  and descending colon which were taken out and were negative for high-grade dysplasia or malignancy. Patient subsequently underwent colostomy takedown procedure by Dr. Burt Knack  9. Repeat CT chest abdomen/ pelvis showed increase in the size of previously seen lung nodules largest being 1.4 cm consistent with metastatic disease. PET/CT showed mild uptake in 2 lung nodules. Others were not hypermetabolic. No other evidence of metastatic disease  10. Repeat lung biopsy consistent with colon adenocarcinoma. Comprehensive RAS panel testing showed mutant KRAS  11.Given the slow rate of growth of his colon cancer and desire to maintain his quality of life plan was to start chemotherapy after he gets a 56-monthbreak in summer. Repeat CT chest abdomen and pelvis in August 2019 showed mild increase in the size of his lung nodules. No new lung nodules are no new sites of distant metastatic disease.FOLFOX Avastin chemotherapy started on 06/22/2018.Patient completed 12 cycles of FOLFOX Avastin chemotherapy with continued response to his lung lesions. Oxaliplatin was dropped and patient is currently on 5-FU Avastin.He did not have mild progression of her disease in lung nodules and was switched to second line FOLFIRI Mvasi  Interval history-tolerating chemotherapy well so far.  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  ECOG PS- 0 Pain scale- 0   Review of systems- Review of Systems  Constitutional: Negative for chills, fever, malaise/fatigue and weight loss.  HENT: Negative for congestion, ear discharge and nosebleeds.   Eyes: Negative for blurred vision.  Respiratory: Negative for cough, hemoptysis, sputum production, shortness of breath and wheezing.   Cardiovascular: Negative for chest pain, palpitations, orthopnea and claudication.  Gastrointestinal: Negative for abdominal pain, blood in stool, constipation, diarrhea, heartburn, melena, nausea and vomiting.  Genitourinary:  Negative for dysuria, flank pain, frequency, hematuria and urgency.  Musculoskeletal: Negative for back pain, joint pain and myalgias.  Skin: Negative for rash.  Neurological: Negative for dizziness, tingling, focal weakness, seizures, weakness and headaches.  Endo/Heme/Allergies: Does not bruise/bleed easily.  Psychiatric/Behavioral: Negative for depression and suicidal ideas. The patient does not have insomnia.       No Known Allergies   Past Medical History:  Diagnosis Date  . Cancer ([place])    skin cancer-nose w graft,also shoulder- basal cell per pt  . Cataract    r eye  . Chronic kidney disease    kidney stones 20 y ago per pt  . Colon cancer ([place]) 2018   Surgical resection and chemo tx's.  . Diabetes mellitus without complication ([place])   . Eczema   . History of kidney stones   . Hyperlipidemia   . Hypertension   . Vertigo      Past Surgical History:  Procedure Laterality Date  . CATARACT EXTRACTION Left   . COLECTOMY WITH COLOSTOMY CREATION/HARTMANN PROCEDURE N/A 02/06/2017   Procedure: COLECTOMY WITH COLOSTOMY CREATION/HARTMANN PROCEDURE;  Surgeon: [name], MD;  Location: [facility] ORS;  Service: General;  Laterality: N/A;  . COLON SURGERY    . COLONOSCOPY WITH PROPOFOL N/A 09/04/2017   Procedure: COLONOSCOPY WITH PROPOFOL;  Surgeon: Jonathon Bellows, MD;  Location: [facility] ENDOSCOPY;  Service: Gastroenterology;  Laterality: N/A;  . COLOSTOMY CLOSURE N/A 10/20/2017   Procedure: COLOSTOMY CLOSURE;  Surgeon: [name], MD;  Location: [facility] ORS;  Service: General;  Laterality: N/A;  . CYSTOSCOPY    . FLEXIBLE SIGMOIDOSCOPY N/A 02/06/2017   Procedure: FLEXIBLE SIGMOIDOSCOPY;  Surgeon: Christene Lye, MD;  Location: [facility] ENDOSCOPY;  Service: Endoscopy;  Laterality: N/A;  . PORTACATH PLACEMENT N/A 06/17/2018   Procedure: INSERTION PORT-A-CATH, WITH FLUOROSCOPY;  Surgeon: [name], MD;  Location: [facility] ORS;  Service: General;  Laterality: N/A;    Social  History   Socioeconomic History  . Marital status: Married    Spouse name: Not on file  . Number of children: Not on file  . Years of education: Not on file  . Highest education level: Not on file  Occupational History  . Not on file  Tobacco Use  . Smoking status: Never Smoker  . Smokeless tobacco: Never Used  Substance and Sexual Activity  . Alcohol use: No  . Drug use: No  . Sexual activity: Not Currently  Other Topics Concern  . Not on file  Social History Narrative  . Not on file   Social Determinants of Health   Financial Resource Strain:   . Difficulty of Paying Living Expenses:   Food Insecurity:   . Worried About Charity fundraiser in the Last Year:   . Arboriculturist in the Last Year:   Transportation Needs:   . Film/video editor (Medical):   Marland Kitchen Lack of Transportation (Non-Medical):   Physical Activity:   . Days of Exercise per Week:   . Minutes of Exercise per Session:   Stress:   . Feeling of Stress :   Social Connections:   . Frequency of Communication with Friends and Family:   . Frequency of Social Gatherings with Friends and Family:   . Attends Religious Services:   . Active Member of Clubs or Organizations:   . Attends Archivist Meetings:   Marland Kitchen Marital Status:   Intimate Partner Violence:   . Fear of Current or Ex-Partner:   .  Emotionally Abused:   Marland Kitchen Physically Abused:   . Sexually Abused:     Family History  Problem Relation Age of Onset  . Diabetes Mother   . AAA (abdominal aortic aneurysm) Mother   . Coronary artery disease Mother   . Diabetes Father   . Coronary artery disease Father   . Dementia Father   . Breast cancer Sister      Current Outpatient Medications:  .  acetaminophen (TYLENOL) 500 MG tablet, Take 500 mg by mouth every 6 (six) hours as needed (for pain.)., Disp: , Rfl:  .  aspirin EC 81 MG tablet, Take 81 mg by mouth daily., Disp: , Rfl:  .  Docusate Sodium (COLACE PO), Take 1 Dose by mouth as needed.,  Disp: , Rfl:  .  Dulaglutide 0.75 MG/0.5ML SOPN, Inject into the skin once a week. , Disp: , Rfl:  .  gabapentin (NEURONTIN) 300 MG capsule, TAKE 3 CAPSULES (900 MG TOTAL) BY MOUTH 2 (TWO) TIMES DAILY., Disp: 180 capsule, Rfl: 2 .  glimepiride (AMARYL) 2 MG tablet, Take 2 mg by mouth 2 (two) times daily before a meal. , Disp: , Rfl:  .  lidocaine-prilocaine (EMLA) cream, Place a small amount over cream over port 1 hour before each treatment, cover cream with saran wrap for clothing protection, Disp: 30 g, Rfl: 1 .  loratadine (CLARITIN) 10 MG tablet, Take 10 mg by mouth daily., Disp: , Rfl:  .  losartan (COZAAR) 25 MG tablet, Take 25 mg by mouth daily., Disp: , Rfl:  .  metFORMIN (GLUCOPHAGE) 1000 MG tablet, Take 1,000-1,500 tablets by mouth See admin instructions. TAKE 1 TABLET (1000 MG) BY MOUTH IN THE MORNING & TAKE 1.5 TABLETS (1500 MG) BY MOUTH AT SUPPER, Disp: , Rfl:  .  ondansetron (ZOFRAN) 8 MG tablet, Take 1 tablet (8 mg total) by mouth every 8 (eight) hours as needed for nausea or vomiting., Disp: 20 tablet, Rfl: 0 .  ONETOUCH VERIO test strip, CHECK FASTING BLOOD GLUCOSE ONCE DAILY, Disp: , Rfl:  .  pravastatin (PRAVACHOL) 20 MG tablet, Take 20 mg by mouth daily with supper. , Disp: , Rfl:  .  triamcinolone (KENALOG) 0.025 % cream, Apply 1 application topically daily as needed (FOR EZCEMA)., Disp: 30 g, Rfl: 2 No current facility-administered medications for this visit.  Facility-Administered Medications Ordered in Other Visits:  .  0.9 %  sodium chloride infusion, , Intravenous, Continuous, Sindy Guadeloupe, MD, Stopped at 07/27/18 1037 .  heparin lock flush 100 unit/mL, 500 Units, Intravenous, Once, Sindy Guadeloupe, MD .  sodium chloride flush (NS) 0.9 % injection 10 mL, 10 mL, Intravenous, PRN, Sindy Guadeloupe, MD, 10 mL at 07/27/18 0920 .  sodium chloride flush (NS) 0.9 % injection 10 mL, 10 mL, Intravenous, Once, Randa Evens C, MD .  sodium chloride flush (NS) 0.9 % injection 10 mL,  10 mL, Intracatheter, PRN, Sindy Guadeloupe, MD, 10 mL at 02/23/20 1144  Physical exam:  Vitals:   02/21/20 0840  BP: 136/65  Pulse: 68  Resp: 16  Temp: (!) 96.7 F (35.9 C)  TempSrc: Tympanic  SpO2: 100%  Weight: 190 lb 6.4 oz (86.4 kg)   Physical Exam Constitutional:      General: He is not in acute distress. HENT:     Head: Normocephalic and atraumatic.  Eyes:     Pupils: Pupils are equal, round, and reactive to light.  Cardiovascular:     Rate and Rhythm: Normal rate and  regular rhythm.     Heart sounds: Normal heart sounds.  Pulmonary:     Effort: Pulmonary effort is normal.     Breath sounds: Normal breath sounds.  Abdominal:     General: Bowel sounds are normal.     Palpations: Abdomen is soft.  Musculoskeletal:     Cervical back: Normal range of motion.  Skin:    General: Skin is warm and dry.  Neurological:     Mental Status: He is alert and oriented to person, place, and time.      CMP Latest Ref Rng & Units 02/21/2020  Glucose 70 - 99 mg/dL 189(H)  BUN 8 - 23 mg/dL 17  Creatinine 0.61 - 1.24 mg/dL 1.12  Sodium 135 - 145 mmol/L 135  Potassium 3.5 - 5.1 mmol/L 4.4  Chloride 98 - 111 mmol/L 103  CO2 22 - 32 mmol/L 20(L)  Calcium 8.9 - 10.3 mg/dL 8.5(L)  Total Protein 6.5 - 8.1 g/dL 6.7  Total Bilirubin 0.3 - 1.2 mg/dL 0.7  Alkaline Phos 38 - 126 U/L 90  AST 15 - 41 U/L 35  ALT 0 - 44 U/L 42   CBC Latest Ref Rng & Units 02/21/2020  WBC 4.0 - 10.5 K/uL 6.5  Hemoglobin 13.0 - 17.0 g/dL 10.8(L)  Hematocrit 39.0 - 52.0 % 35.2(L)  Platelets 150 - 400 K/uL 182    Assessment and plan- Patient is a 79 y.o. male with stage IV colon cancer and lung metastases. He has had progression on FOLFOXZirabevchemotherapy. He is here for on treatment assessment prior to cycle 11 of FOLFIRI Seroquel chemotherapy  Counts okay to proceed with cycle 11 of FOLFIRI Zirabev chemotherapy today.  Hemoglobin is remaining stable around 10.  He is otherwise tolerating  chemotherapy well without any significant side effects.  He has low-volume disease and for better tolerance we are doing treatment every 3 weeks.  I will see him back in 3 weeks for cycle 12.  Blood pressure remained stable and urine protein remains trace to +1  Chemo-induced peripheral neuropathy: Continue gabapentin   Visit Diagnosis 1. Encounter for monoclonal antibody treatment for malignancy   2. Encounter for antineoplastic chemotherapy   3. Malignant neoplasm metastatic to lung, unspecified laterality ([place])   4. 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[place])   5. Chemotherapy-induced peripheral neuropathy ([place])      Dr. Randa Evens, MD, MPH [facility] [phone] 02/23/2020 2:53 PM

## 2020-03-05 ENCOUNTER — Telehealth: Payer: Self-pay | Admitting: *Deleted

## 2020-03-05 NOTE — Telephone Encounter (Signed)
Patient called today to say that his daughter-in-law tested positive for Covid at work.  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  This week he will only need to watch her on Wednesday but he still takes her to school every day.  He wanted to know what precautions he might need to take.  Patient has had his vaccination and he wears a mask when he is out and about.  Dr. [name] said it is fine for him to go to her softball game that she has scheduled for him just to be 6 foot apart and wear a mask.  Agreeable to plan.

## 2020-03-06 NOTE — Progress Notes (Signed)
Pharmacist Chemotherapy Monitoring - Follow Up Assessment    I verify that I have reviewed each item in the below checklist:  . Regimen for the patient is scheduled for the appropriate day and plan matches scheduled date. Marland Kitchen Appropriate non-routine labs are ordered dependent on drug ordered. . If applicable, additional medications reviewed and ordered per protocol based on lifetime cumulative doses and/or treatment regimen.   Plan for follow-up and/or issues identified: No . I-vent associated with next due treatment: No . MD and/or nursing notified: No  Miguel Flores K 03/06/2020 10:41 AM

## 2020-03-06 NOTE — Progress Notes (Signed)
Pharmacist Chemotherapy Monitoring - Follow Up Assessment    I verify that I have reviewed each item in the below checklist:  . Regimen for the patient is scheduled for the appropriate day and plan matches scheduled date. Marland Kitchen Appropriate non-routine labs are ordered dependent on drug ordered. . If applicable, additional medications reviewed and ordered per protocol based on lifetime cumulative doses and/or treatment regimen.   Plan for follow-up and/or issues identified: No . I-vent associated with next due treatment: No . MD and/or nursing notified: No  Chetara Kropp K 03/06/2020 8:50 AM

## 2020-03-13 ENCOUNTER — Inpatient Hospital Stay: Payer: Medicare Other

## 2020-03-13 ENCOUNTER — Other Ambulatory Visit: Payer: Self-pay

## 2020-03-13 ENCOUNTER — Inpatient Hospital Stay (HOSPITAL_BASED_OUTPATIENT_CLINIC_OR_DEPARTMENT_OTHER): Payer: Medicare Other | Admitting: Oncology

## 2020-03-13 ENCOUNTER — Encounter: Payer: Self-pay | Admitting: Oncology

## 2020-03-13 ENCOUNTER — Inpatient Hospital Stay: Payer: Medicare Other | Attending: Oncology

## 2020-03-13 VITALS — BP 145/65 | HR 72 | Temp 96.3°F | Resp 20 | Wt 188.0 lb

## 2020-03-13 DIAGNOSIS — Z5189 Encounter for other specified aftercare: Secondary | ICD-10-CM | POA: Diagnosis not present

## 2020-03-13 DIAGNOSIS — Z5112 Encounter for antineoplastic immunotherapy: Secondary | ICD-10-CM | POA: Diagnosis not present

## 2020-03-13 DIAGNOSIS — C189 Malignant neoplasm of colon, unspecified: Secondary | ICD-10-CM | POA: Diagnosis not present

## 2020-03-13 DIAGNOSIS — G620 Drug-induced polyneuropathy: Secondary | ICD-10-CM | POA: Diagnosis not present

## 2020-03-13 DIAGNOSIS — Z5111 Encounter for antineoplastic chemotherapy: Secondary | ICD-10-CM | POA: Diagnosis not present

## 2020-03-13 DIAGNOSIS — C7801 Secondary malignant neoplasm of right lung: Secondary | ICD-10-CM | POA: Diagnosis not present

## 2020-03-13 DIAGNOSIS — T451X5A Adverse effect of antineoplastic and immunosuppressive drugs, initial encounter: Secondary | ICD-10-CM

## 2020-03-13 DIAGNOSIS — D6481 Anemia due to antineoplastic chemotherapy: Secondary | ICD-10-CM

## 2020-03-13 DIAGNOSIS — C7800 Secondary malignant neoplasm of unspecified lung: Secondary | ICD-10-CM | POA: Diagnosis not present

## 2020-03-13 DIAGNOSIS — C187 Malignant neoplasm of sigmoid colon: Secondary | ICD-10-CM | POA: Diagnosis present

## 2020-03-13 DIAGNOSIS — Z95828 Presence of other vascular implants and grafts: Secondary | ICD-10-CM

## 2020-03-13 DIAGNOSIS — C7802 Secondary malignant neoplasm of left lung: Secondary | ICD-10-CM | POA: Diagnosis not present

## 2020-03-13 LAB — CBC WITH DIFFERENTIAL/PLATELET
Abs Immature Granulocytes: 0.03 10*3/uL (ref 0.00–0.07)
Basophils Absolute: 0.1 10*3/uL (ref 0.0–0.1)
Basophils Relative: 1 %
Eosinophils Absolute: 0.3 10*3/uL (ref 0.0–0.5)
Eosinophils Relative: 3 %
HCT: 35.5 % — ABNORMAL LOW (ref 39.0–52.0)
Hemoglobin: 10.9 g/dL — ABNORMAL LOW (ref 13.0–17.0)
Immature Granulocytes: 0 %
Lymphocytes Relative: 28 %
Lymphs Abs: 2 10*3/uL (ref 0.7–4.0)
MCH: 28.8 pg (ref 26.0–34.0)
MCHC: 30.7 g/dL (ref 30.0–36.0)
MCV: 93.9 fL (ref 80.0–100.0)
Monocytes Absolute: 0.6 10*3/uL (ref 0.1–1.0)
Monocytes Relative: 8 %
Neutro Abs: 4.4 10*3/uL (ref 1.7–7.7)
Neutrophils Relative %: 60 %
Platelets: 184 10*3/uL (ref 150–400)
RBC: 3.78 MIL/uL — ABNORMAL LOW (ref 4.22–5.81)
RDW: 18.5 % — ABNORMAL HIGH (ref 11.5–15.5)
WBC: 7.4 10*3/uL (ref 4.0–10.5)
nRBC: 0 % (ref 0.0–0.2)

## 2020-03-13 LAB — COMPREHENSIVE METABOLIC PANEL
ALT: 29 U/L (ref 0–44)
AST: 37 U/L (ref 15–41)
Albumin: 3.9 g/dL (ref 3.5–5.0)
Alkaline Phosphatase: 81 U/L (ref 38–126)
Anion gap: 11 (ref 5–15)
BUN: 17 mg/dL (ref 8–23)
CO2: 22 mmol/L (ref 22–32)
Calcium: 8.9 mg/dL (ref 8.9–10.3)
Chloride: 104 mmol/L (ref 98–111)
Creatinine, Ser: 1.12 mg/dL (ref 0.61–1.24)
GFR calc Af Amer: >60 mL/min (ref >60)
GFR calc non Af Amer: >60 mL/min (ref >60)
Glucose, Bld: 220 mg/dL — ABNORMAL HIGH (ref 70–99)
Potassium: 4.6 mmol/L (ref 3.5–5.1)
Sodium: 137 mmol/L (ref 135–145)
Total Bilirubin: 0.6 mg/dL (ref 0.3–1.2)
Total Protein: 6.7 g/dL (ref 6.5–8.1)

## 2020-03-13 LAB — PROTEIN, URINE, RANDOM: Total Protein, Urine: <6 mg/dL

## 2020-03-13 MED ORDER — DEXAMETHASONE SODIUM PHOSPHATE 10 MG/ML IJ SOLN
10.0000 mg | Freq: Once | INTRAMUSCULAR | Status: AC
  Administered 2020-03-13: 10 mg via INTRAVENOUS
  Filled 2020-03-13: qty 1

## 2020-03-13 MED ORDER — SODIUM CHLORIDE 0.9% FLUSH
10.0000 mL | INTRAVENOUS | Status: DC | PRN
  Administered 2020-03-13: 08:00:00 10 mL via INTRAVENOUS
  Filled 2020-03-13: qty 10

## 2020-03-13 MED ORDER — PALONOSETRON HCL INJECTION 0.25 MG/5ML
0.2500 mg | Freq: Once | INTRAVENOUS | Status: AC
  Administered 2020-03-13: 0.25 mg via INTRAVENOUS
  Filled 2020-03-13: qty 5

## 2020-03-13 MED ORDER — ATROPINE SULFATE 1 MG/ML IJ SOLN
0.5000 mg | Freq: Once | INTRAMUSCULAR | Status: AC | PRN
  Administered 2020-03-13: 10:00:00 0.5 mg via INTRAVENOUS
  Filled 2020-03-13: qty 1

## 2020-03-13 MED ORDER — SODIUM CHLORIDE 0.9 % IV SOLN
402.0000 mg/m2 | Freq: Once | INTRAVENOUS | Status: AC
  Administered 2020-03-13: 800 mg via INTRAVENOUS
  Filled 2020-03-13: qty 40

## 2020-03-13 MED ORDER — FLUOROURACIL CHEMO INJECTION 2.5 GM/50ML
400.0000 mg/m2 | Freq: Once | INTRAVENOUS | Status: AC
  Administered 2020-03-13: 800 mg via INTRAVENOUS
  Filled 2020-03-13: qty 16

## 2020-03-13 MED ORDER — SODIUM CHLORIDE 0.9 % IV SOLN
150.0000 mg/m2 | Freq: Once | INTRAVENOUS | Status: AC
  Administered 2020-03-13: 11:00:00 300 mg via INTRAVENOUS
  Filled 2020-03-13: qty 15

## 2020-03-13 MED ORDER — SODIUM CHLORIDE 0.9 % IV SOLN
Freq: Once | INTRAVENOUS | Status: AC
  Filled 2020-03-13: qty 250

## 2020-03-13 MED ORDER — SODIUM CHLORIDE 0.9 % IV SOLN
2400.0000 mg/m2 | INTRAVENOUS | Status: DC
  Administered 2020-03-13: 13:00:00 4800 mg via INTRAVENOUS
  Filled 2020-03-13: qty 96

## 2020-03-13 MED ORDER — SODIUM CHLORIDE 0.9 % IV SOLN
5.0000 mg/kg | Freq: Once | INTRAVENOUS | Status: AC
  Administered 2020-03-13: 400 mg via INTRAVENOUS
  Filled 2020-03-13: qty 16

## 2020-03-13 NOTE — Progress Notes (Signed)
Patient here today for follow up regarding colon cancer. Patient reports worsening neuropathy to feet and feels it is spreading up to his knees.

## 2020-03-15 ENCOUNTER — Other Ambulatory Visit: Payer: Self-pay

## 2020-03-15 ENCOUNTER — Inpatient Hospital Stay: Payer: Medicare Other

## 2020-03-15 VITALS — BP 151/76 | HR 69 | Temp 96.5°F | Resp 18

## 2020-03-15 DIAGNOSIS — Z5112 Encounter for antineoplastic immunotherapy: Secondary | ICD-10-CM | POA: Diagnosis not present

## 2020-03-15 DIAGNOSIS — C189 Malignant neoplasm of colon, unspecified: Secondary | ICD-10-CM

## 2020-03-15 DIAGNOSIS — C7800 Secondary malignant neoplasm of unspecified lung: Secondary | ICD-10-CM

## 2020-03-15 MED ORDER — PEGFILGRASTIM-CBQV 6 MG/0.6ML ~~LOC~~ SOSY
6.0000 mg | PREFILLED_SYRINGE | Freq: Once | SUBCUTANEOUS | Status: AC
  Administered 2020-03-15: 6 mg via SUBCUTANEOUS
  Filled 2020-03-15: qty 0.6

## 2020-03-15 MED ORDER — SODIUM CHLORIDE 0.9% FLUSH
10.0000 mL | INTRAVENOUS | Status: DC | PRN
  Administered 2020-03-15: 10 mL
  Filled 2020-03-15: qty 10

## 2020-03-15 MED ORDER — HEPARIN SOD (PORK) LOCK FLUSH 100 UNIT/ML IV SOLN
500.0000 [IU] | Freq: Once | INTRAVENOUS | Status: AC | PRN
  Administered 2020-03-15: 500 [IU]
  Filled 2020-03-15: qty 5

## 2020-03-16 NOTE — Progress Notes (Signed)
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 At that time he had progressive abdominal distention and no bowel movement for about one week. CT abdomen showed an apple core lesion in the descending/sigmoid colon.  2. Patient underwent Hartmann's procedure for obstructing sigmoid colon mass on 02/06/2017. Preoperative CEA was 5.0  3. 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 It is 0 out of 15 lymph nodes were positive for malignancy. Perineural and lymphovascular invasion was present. Margins were negative.pT3N0. MMR stable.  4. Given that he had high risk stage II colon cancer including he presented with obstruction and has LVI and perineural invasion- adjuvant xeloda chemotherapy was recommended for 6 months.   5.Patient had evidence of hand foot syndrome after cycle 3. Cycle 4 delayed by 1 week. Topical urea cream prescribed  6. Patient completed 8 cycles of adjuvant xeloda in October 2018. polps seen in sigmoid and decending colon but negative for malignancy  7. Repeat Ct abdomen after 8 cycles showed no metastatic disease. 6 mm lung nodule noted in RLL  8.Patient had a repeat colonoscopy in October 2018 which showed polyps in his transverse  and descending colon which were taken out and were negative for high-grade dysplasia or malignancy. Patient subsequently underwent colostomy takedown procedure by Dr. Burt Knack  9. Repeat CT chest abdomen/ pelvis showed increase in the size of previously seen lung nodules largest being 1.4 cm consistent with metastatic disease. PET/CT showed mild uptake in 2 lung nodules. Others were not hypermetabolic. No other evidence of metastatic disease  10. Repeat lung biopsy consistent with colon adenocarcinoma. Comprehensive RAS panel testing showed mutant KRAS  11.Given the slow rate of growth of his colon cancer and desire to maintain his quality of life plan was to start chemotherapy after he gets a 41-monthbreak in summer. Repeat CT chest abdomen and pelvis in August 2019 showed mild increase in the size of his lung nodules. No new lung nodules are no new sites of distant metastatic disease.FOLFOX Avastin chemotherapy started on 06/22/2018.Patient completed 12 cycles of FOLFOX Avastin chemotherapy with continued response to his lung lesions. Oxaliplatin was dropped and patient is currently on 5-FU Avastin.He did not have mild progression of her disease in lung nodules and was switched to second line FOLFIRI Mvasi  Interval history-tolerating chemotherapy well.  Continues to have tingling numbness in his hands and feet which sometimes gets worse and travels up to his knees.  He has been taking gabapentin 900 mg twice daily.  Denies other complaints  ECOG PS- 1 Pain scale- 0   Review of systems- Review of Systems  Constitutional: Negative for chills, fever, malaise/fatigue and weight loss.  HENT: Negative for congestion, ear discharge and nosebleeds.   Eyes: Negative for blurred vision.  Respiratory: Negative for cough, hemoptysis, sputum production, shortness of breath and wheezing.   Cardiovascular: Negative for chest pain, palpitations,  orthopnea and claudication.  Gastrointestinal:  Negative for abdominal pain, blood in stool, constipation, diarrhea, heartburn, melena, nausea and vomiting.  Genitourinary: Negative for dysuria, flank pain, frequency, hematuria and urgency.  Musculoskeletal: Negative for back pain, joint pain and myalgias.  Skin: Negative for rash.  Neurological: Positive for sensory change (Peripheral neuropathy). Negative for dizziness, tingling, focal weakness, seizures, weakness and headaches.  Endo/Heme/Allergies: Does not bruise/bleed easily.  Psychiatric/Behavioral: Negative for depression and suicidal ideas. The patient does not have insomnia.        No Known Allergies   Past Medical History:  Diagnosis Date  . Cancer ([place])    skin cancer-nose w graft,also shoulder- basal cell per pt  . Cataract    r eye  . Chronic kidney disease    kidney stones 20 y ago per pt  . Colon cancer ([place]) 2018   Surgical resection and chemo tx's.  . Diabetes mellitus without complication ([place])   . Eczema   . History of kidney stones   . Hyperlipidemia   . Hypertension   . Vertigo      Past Surgical History:  Procedure Laterality Date  . CATARACT EXTRACTION Left   . COLECTOMY WITH COLOSTOMY CREATION/HARTMANN PROCEDURE N/A 02/06/2017   Procedure: COLECTOMY WITH COLOSTOMY CREATION/HARTMANN PROCEDURE;  Surgeon: [name], MD;  Location: [facility] ORS;  Service: General;  Laterality: N/A;  . COLON SURGERY    . COLONOSCOPY WITH PROPOFOL N/A 09/04/2017   Procedure: COLONOSCOPY WITH PROPOFOL;  Surgeon: Jonathon Bellows, MD;  Location: [facility] ENDOSCOPY;  Service: Gastroenterology;  Laterality: N/A;  . COLOSTOMY CLOSURE N/A 10/20/2017   Procedure: COLOSTOMY CLOSURE;  Surgeon: [name], MD;  Location: [facility] ORS;  Service: General;  Laterality: N/A;  . CYSTOSCOPY    . FLEXIBLE SIGMOIDOSCOPY N/A 02/06/2017   Procedure: FLEXIBLE SIGMOIDOSCOPY;  Surgeon: Christene Lye, MD;  Location: [facility] ENDOSCOPY;  Service: Endoscopy;  Laterality: N/A;  . PORTACATH  PLACEMENT N/A 06/17/2018   Procedure: INSERTION PORT-A-CATH, WITH FLUOROSCOPY;  Surgeon: [name], MD;  Location: [facility] ORS;  Service: General;  Laterality: N/A;    Social History   Socioeconomic History  . Marital status: Married    Spouse name: Not on file  . Number of children: Not on file  . Years of education: Not on file  . Highest education level: Not on file  Occupational History  . Not on file  Tobacco Use  . Smoking status: Never Smoker  . Smokeless tobacco: Never Used  Substance and Sexual Activity  . Alcohol use: No  . Drug use: No  . Sexual activity: Not Currently  Other Topics Concern  . Not on file  Social History Narrative  . Not on file   Social Determinants of Health   Financial Resource Strain:   . Difficulty of Paying Living Expenses:   Food Insecurity:   . Worried About Charity fundraiser in the Last Year:   . Arboriculturist in the Last Year:   Transportation Needs:   . Film/video editor (Medical):   Marland Kitchen Lack of Transportation (Non-Medical):   Physical Activity:   . Days of Exercise per Week:   . Minutes of Exercise per Session:   Stress:   . Feeling of Stress :   Social Connections:   . Frequency of Communication with Friends and Family:   . Frequency of Social Gatherings with Friends and Family:   . Attends Religious Services:   . Active Member of Clubs or  Organizations:   . Attends Archivist Meetings:   Marland Kitchen Marital Status:   Intimate Partner Violence:   . Fear of Current or Ex-Partner:   . Emotionally Abused:   Marland Kitchen Physically Abused:   . Sexually Abused:     Family History  Problem Relation Age of Onset  . Diabetes Mother   . AAA (abdominal aortic aneurysm) Mother   . Coronary artery disease Mother   . Diabetes Father   . Coronary artery disease Father   . Dementia Father   . Breast cancer Sister      Current Outpatient Medications:  .  acetaminophen (TYLENOL) 500 MG tablet, Take 500 mg by mouth every 6  (six) hours as needed (for pain.)., Disp: , Rfl:  .  aspirin EC 81 MG tablet, Take 81 mg by mouth daily., Disp: , Rfl:  .  Docusate Sodium (COLACE PO), Take 1 Dose by mouth as needed., Disp: , Rfl:  .  Dulaglutide 0.75 MG/0.5ML SOPN, Inject into the skin once a week. , Disp: , Rfl:  .  gabapentin (NEURONTIN) 300 MG capsule, TAKE 3 CAPSULES (900 MG TOTAL) BY MOUTH 2 (TWO) TIMES DAILY., Disp: 180 capsule, Rfl: 2 .  glimepiride (AMARYL) 2 MG tablet, Take 2 mg by mouth 2 (two) times daily before a meal. , Disp: , Rfl:  .  lidocaine-prilocaine (EMLA) cream, Place a small amount over cream over port 1 hour before each treatment, cover cream with saran wrap for clothing protection, Disp: 30 g, Rfl: 1 .  loratadine (CLARITIN) 10 MG tablet, Take 10 mg by mouth daily., Disp: , Rfl:  .  losartan (COZAAR) 25 MG tablet, Take 25 mg by mouth daily., Disp: , Rfl:  .  metFORMIN (GLUCOPHAGE) 1000 MG tablet, Take 1,000-1,500 tablets by mouth See admin instructions. TAKE 1 TABLET (1000 MG) BY MOUTH IN THE MORNING & TAKE 1.5 TABLETS (1500 MG) BY MOUTH AT SUPPER, Disp: , Rfl:  .  ondansetron (ZOFRAN) 8 MG tablet, Take 1 tablet (8 mg total) by mouth every 8 (eight) hours as needed for nausea or vomiting., Disp: 20 tablet, Rfl: 0 .  ONETOUCH VERIO test strip, CHECK FASTING BLOOD GLUCOSE ONCE DAILY, Disp: , Rfl:  .  pravastatin (PRAVACHOL) 20 MG tablet, Take 20 mg by mouth daily with supper. , Disp: , Rfl:  .  triamcinolone (KENALOG) 0.025 % cream, Apply 1 application topically daily as needed (FOR EZCEMA)., Disp: 30 g, Rfl: 2 No current facility-administered medications for this visit.  Facility-Administered Medications Ordered in Other Visits:  .  0.9 %  sodium chloride infusion, , Intravenous, Continuous, Sindy Guadeloupe, MD, Stopped at 07/27/18 1037 .  heparin lock flush 100 unit/mL, 500 Units, Intravenous, Once, Sindy Guadeloupe, MD .  sodium chloride flush (NS) 0.9 % injection 10 mL, 10 mL, Intravenous, PRN, Sindy Guadeloupe, MD, 10 mL at 07/27/18 0920 .  sodium chloride flush (NS) 0.9 % injection 10 mL, 10 mL, Intravenous, Once, Sindy Guadeloupe, MD  Physical exam:  Vitals:   03/13/20 0856 03/13/20 0858  BP:  (!) 145/65  Pulse:  72  Resp:  20  Temp: (!) 96.3 F (35.7 C)   TempSrc: Tympanic   Weight: 188 lb (85.3 kg)    Physical Exam Constitutional:      General: He is not in acute distress. Cardiovascular:     Rate and Rhythm: Normal rate and regular rhythm.     Heart sounds: Normal heart sounds.  Pulmonary:  Effort: Pulmonary effort is normal.     Breath sounds: Normal breath sounds.  Abdominal:     General: Bowel sounds are normal.     Palpations: Abdomen is soft.  Skin:    General: Skin is warm and dry.  Neurological:     Mental Status: He is alert and oriented to person, place, and time.      CMP Latest Ref Rng & Units 03/13/2020  Glucose 70 - 99 mg/dL 220(H)  BUN 8 - 23 mg/dL 17  Creatinine 0.61 - 1.24 mg/dL 1.12  Sodium 135 - 145 mmol/L 137  Potassium 3.5 - 5.1 mmol/L 4.6  Chloride 98 - 111 mmol/L 104  CO2 22 - 32 mmol/L 22  Calcium 8.9 - 10.3 mg/dL 8.9  Total Protein 6.5 - 8.1 g/dL 6.7  Total Bilirubin 0.3 - 1.2 mg/dL 0.6  Alkaline Phos 38 - 126 U/L 81  AST 15 - 41 U/L 37  ALT 0 - 44 U/L 29   CBC Latest Ref Rng & Units 03/13/2020  WBC 4.0 - 10.5 K/uL 7.4  Hemoglobin 13.0 - 17.0 g/dL 10.9(L)  Hematocrit 39.0 - 52.0 % 35.5(L)  Platelets 150 - 400 K/uL 184     Assessment and plan- Patient is a 79 y.o. male  with stage IV colon cancer and lung metastases. He has had progression on FOLFOXZirabevchemotherapy.   He is here for on treatment assessment prior to cycle 12 of FOLFIRI Zirabev chemotherapy  Counts okay to proceed with cycle 12 of FOLFIRI Zirabev chemotherapy today.  He will be getting CT chest abdomen and pelvis with contrast after the cycle and I will see him back in 3 weeks for cycle 13.  Blood pressure is stable and urine protein is negative and therefore  he can receive Zirabev today  Chemo-induced peripheral neuropathy: He is currently using gabapentin 900 mg twice daily.  I have asked him to take an additional dose of 600 mg in between if his neuropathy is getting worse.  Chemo induced anemia: Patient has been receiving chemotherapy for a while and is gradually getting more anemic.  Hemoglobin has gradually drifted down from 12-10.  Presently remaining stable between 10-11.  Continue to monitor.  At some point if his anemia gets worse we may have to consider giving him a temporary break from chemotherapy or reducing the dose.   Visit Diagnosis 1. Malignant neoplasm metastatic to lung, unspecified laterality ([place])   2. 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[place])   3. Chemotherapy-induced peripheral neuropathy ([place])   4. Encounter for antineoplastic chemotherapy   5. Antineoplastic chemotherapy induced anemia      Dr. Randa Evens, MD, MPH [facility] [phone] 03/16/2020 7:41 AM

## 2020-03-27 ENCOUNTER — Ambulatory Visit
Admission: RE | Admit: 2020-03-27 | Discharge: 2020-03-27 | Disposition: A | Discharge status: Home / Self Care | Payer: Medicare Other | Source: Ambulatory Visit | Attending: Oncology | Admitting: Oncology

## 2020-03-27 ENCOUNTER — Other Ambulatory Visit: Payer: Self-pay

## 2020-03-27 ENCOUNTER — Other Ambulatory Visit: Payer: Self-pay | Admitting: Oncology

## 2020-03-27 DIAGNOSIS — C7800 Secondary malignant neoplasm of unspecified lung: Secondary | ICD-10-CM | POA: Diagnosis not present

## 2020-03-27 DIAGNOSIS — C189 Malignant neoplasm of colon, unspecified: Secondary | ICD-10-CM

## 2020-03-27 MED ORDER — IOHEXOL 300 MG/ML  SOLN
100.0000 mL | Freq: Once | INTRAMUSCULAR | Status: AC | PRN
  Administered 2020-03-27: 100 mL via INTRAVENOUS

## 2020-03-27 NOTE — Progress Notes (Signed)
Pharmacist Chemotherapy Monitoring - Follow Up Assessment    I verify that I have reviewed each item in the below checklist:  . Regimen for the patient is scheduled for the appropriate day and plan matches scheduled date. Marland Kitchen Appropriate non-routine labs are ordered dependent on drug ordered. . If applicable, additional medications reviewed and ordered per protocol based on lifetime cumulative doses and/or treatment regimen.   Plan for follow-up and/or issues identified: No . I-vent associated with next due treatment: No . MD and/or nursing notified: No  Mishika Flippen K 03/27/2020 8:39 AM

## 2020-04-03 ENCOUNTER — Inpatient Hospital Stay: Payer: Medicare Other

## 2020-04-03 ENCOUNTER — Inpatient Hospital Stay (HOSPITAL_BASED_OUTPATIENT_CLINIC_OR_DEPARTMENT_OTHER): Payer: Medicare Other | Admitting: Oncology

## 2020-04-03 ENCOUNTER — Other Ambulatory Visit: Payer: Self-pay

## 2020-04-03 ENCOUNTER — Encounter: Payer: Self-pay | Admitting: Oncology

## 2020-04-03 VITALS — BP 156/59 | HR 70 | Temp 97.7°F | Resp 18 | Wt 185.1 lb

## 2020-04-03 DIAGNOSIS — C189 Malignant neoplasm of colon, unspecified: Secondary | ICD-10-CM

## 2020-04-03 DIAGNOSIS — Z5111 Encounter for antineoplastic chemotherapy: Secondary | ICD-10-CM

## 2020-04-03 DIAGNOSIS — Z7189 Other specified counseling: Secondary | ICD-10-CM

## 2020-04-03 DIAGNOSIS — Z5112 Encounter for antineoplastic immunotherapy: Secondary | ICD-10-CM

## 2020-04-03 DIAGNOSIS — C7800 Secondary malignant neoplasm of unspecified lung: Secondary | ICD-10-CM

## 2020-04-03 LAB — CBC WITH DIFFERENTIAL/PLATELET
Abs Immature Granulocytes: 0.03 10*3/uL (ref 0.00–0.07)
Basophils Absolute: 0.1 10*3/uL (ref 0.0–0.1)
Basophils Relative: 1 %
Eosinophils Absolute: 0.3 10*3/uL (ref 0.0–0.5)
Eosinophils Relative: 3 %
HCT: 35.1 % — ABNORMAL LOW (ref 39.0–52.0)
Hemoglobin: 11.1 g/dL — ABNORMAL LOW (ref 13.0–17.0)
Immature Granulocytes: 0 %
Lymphocytes Relative: 26 %
Lymphs Abs: 2.1 10*3/uL (ref 0.7–4.0)
MCH: 29.1 pg (ref 26.0–34.0)
MCHC: 31.6 g/dL (ref 30.0–36.0)
MCV: 91.9 fL (ref 80.0–100.0)
Monocytes Absolute: 0.6 10*3/uL (ref 0.1–1.0)
Monocytes Relative: 7 %
Neutro Abs: 5.1 10*3/uL (ref 1.7–7.7)
Neutrophils Relative %: 63 %
Platelets: 191 10*3/uL (ref 150–400)
RBC: 3.82 MIL/uL — ABNORMAL LOW (ref 4.22–5.81)
RDW: 18.6 % — ABNORMAL HIGH (ref 11.5–15.5)
WBC: 8.2 10*3/uL (ref 4.0–10.5)
nRBC: 0 % (ref 0.0–0.2)

## 2020-04-03 LAB — COMPREHENSIVE METABOLIC PANEL
ALT: 25 U/L (ref 0–44)
AST: 32 U/L (ref 15–41)
Albumin: 4.2 g/dL (ref 3.5–5.0)
Alkaline Phosphatase: 83 U/L (ref 38–126)
Anion gap: 11 (ref 5–15)
BUN: 17 mg/dL (ref 8–23)
CO2: 21 mmol/L — ABNORMAL LOW (ref 22–32)
Calcium: 8.8 mg/dL — ABNORMAL LOW (ref 8.9–10.3)
Chloride: 103 mmol/L (ref 98–111)
Creatinine, Ser: 1.1 mg/dL (ref 0.61–1.24)
GFR calc Af Amer: >60 mL/min (ref >60)
GFR calc non Af Amer: >60 mL/min (ref >60)
Glucose, Bld: 206 mg/dL — ABNORMAL HIGH (ref 70–99)
Potassium: 4.5 mmol/L (ref 3.5–5.1)
Sodium: 135 mmol/L (ref 135–145)
Total Bilirubin: 0.6 mg/dL (ref 0.3–1.2)
Total Protein: 7 g/dL (ref 6.5–8.1)

## 2020-04-03 LAB — PROTEIN, URINE, RANDOM: Total Protein, Urine: 10 mg/dL

## 2020-04-03 MED ORDER — SODIUM CHLORIDE 0.9 % IV SOLN
5.0000 mg/kg | Freq: Once | INTRAVENOUS | Status: AC
  Administered 2020-04-03: 400 mg via INTRAVENOUS
  Filled 2020-04-03: qty 16

## 2020-04-03 MED ORDER — FLUOROURACIL CHEMO INJECTION 2.5 GM/50ML
400.0000 mg/m2 | Freq: Once | INTRAVENOUS | Status: AC
  Administered 2020-04-03: 800 mg via INTRAVENOUS
  Filled 2020-04-03: qty 16

## 2020-04-03 MED ORDER — PALONOSETRON HCL INJECTION 0.25 MG/5ML
0.2500 mg | Freq: Once | INTRAVENOUS | Status: AC
  Administered 2020-04-03: 0.25 mg via INTRAVENOUS
  Filled 2020-04-03: qty 5

## 2020-04-03 MED ORDER — SODIUM CHLORIDE 0.9 % IV SOLN
Freq: Once | INTRAVENOUS | Status: AC
  Filled 2020-04-03: qty 250

## 2020-04-03 MED ORDER — SODIUM CHLORIDE 0.9 % IV SOLN
2400.0000 mg/m2 | INTRAVENOUS | Status: DC
  Administered 2020-04-03: 4800 mg via INTRAVENOUS
  Filled 2020-04-03: qty 96

## 2020-04-03 MED ORDER — LEUCOVORIN CALCIUM INJECTION 350 MG
402.0000 mg/m2 | Freq: Once | INTRAVENOUS | Status: AC
  Administered 2020-04-03: 800 mg via INTRAVENOUS
  Filled 2020-04-03: qty 40

## 2020-04-03 MED ORDER — ATROPINE SULFATE 1 MG/ML IJ SOLN
0.5000 mg | Freq: Once | INTRAMUSCULAR | Status: AC | PRN
  Administered 2020-04-03: 0.5 mg via INTRAVENOUS
  Filled 2020-04-03: qty 1

## 2020-04-03 MED ORDER — IRINOTECAN HCL CHEMO INJECTION 100 MG/5ML
150.0000 mg/m2 | Freq: Once | INTRAVENOUS | Status: AC
  Administered 2020-04-03: 300 mg via INTRAVENOUS
  Filled 2020-04-03: qty 15

## 2020-04-03 MED ORDER — DEXAMETHASONE SODIUM PHOSPHATE 10 MG/ML IJ SOLN
10.0000 mg | Freq: Once | INTRAMUSCULAR | Status: AC
  Administered 2020-04-03: 10 mg via INTRAVENOUS
  Filled 2020-04-03: qty 1

## 2020-04-03 MED ORDER — SODIUM CHLORIDE 0.9% FLUSH
10.0000 mL | Freq: Once | INTRAVENOUS | Status: AC
  Administered 2020-04-03: 10 mL via INTRAVENOUS
  Filled 2020-04-03: qty 10

## 2020-04-03 NOTE — Progress Notes (Signed)
Patient here for oncology follow-up appointment, concerns of knee discomfort states he may be his neuropathy, otherwise no concerns.

## 2020-04-05 ENCOUNTER — Inpatient Hospital Stay: Payer: Medicare Other

## 2020-04-05 ENCOUNTER — Other Ambulatory Visit: Payer: Self-pay

## 2020-04-05 DIAGNOSIS — Z5112 Encounter for antineoplastic immunotherapy: Secondary | ICD-10-CM | POA: Diagnosis not present

## 2020-04-05 DIAGNOSIS — C189 Malignant neoplasm of colon, unspecified: Secondary | ICD-10-CM

## 2020-04-05 DIAGNOSIS — C7800 Secondary malignant neoplasm of unspecified lung: Secondary | ICD-10-CM

## 2020-04-05 MED ORDER — HEPARIN SOD (PORK) LOCK FLUSH 100 UNIT/ML IV SOLN
500.0000 [IU] | Freq: Once | INTRAVENOUS | Status: AC | PRN
  Administered 2020-04-05: 500 [IU]
  Filled 2020-04-05: qty 5

## 2020-04-05 MED ORDER — SODIUM CHLORIDE 0.9% FLUSH
10.0000 mL | INTRAVENOUS | Status: DC | PRN
  Administered 2020-04-05: 10 mL
  Filled 2020-04-05: qty 10

## 2020-04-05 MED ORDER — PEGFILGRASTIM-CBQV 6 MG/0.6ML ~~LOC~~ SOSY
6.0000 mg | PREFILLED_SYRINGE | Freq: Once | SUBCUTANEOUS | Status: AC
  Administered 2020-04-05: 6 mg via SUBCUTANEOUS

## 2020-04-06 NOTE — Progress Notes (Signed)
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 At that time he had progressive abdominal distention and no bowel movement for about one week. CT abdomen showed an apple core lesion in the descending/sigmoid colon.  2. Patient underwent Hartmann's procedure for obstructing sigmoid colon mass on 02/06/2017. Preoperative CEA was 5.0  3. 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 It is 0 out of 15 lymph nodes were positive for malignancy. Perineural and lymphovascular invasion was present. Margins were negative.pT3N0. MMR stable.  4. Given that he had high risk stage II colon cancer including he presented with obstruction and has LVI and perineural invasion- adjuvant xeloda chemotherapy was recommended for 6 months.   5.Patient had evidence of hand foot syndrome after cycle 3. Cycle 4 delayed by 1 week. Topical urea cream prescribed  6. Patient completed 8 cycles of adjuvant xeloda in October 2018. polps seen in sigmoid and decending colon but negative for malignancy  7. Repeat Ct abdomen after 8 cycles showed no metastatic disease. 6 mm lung nodule noted in RLL  8.Patient had a repeat colonoscopy in October 2018 which showed polyps in his transverse  and descending colon which were taken out and were negative for high-grade dysplasia or malignancy. Patient subsequently underwent colostomy takedown procedure by Dr. Burt Knack  9. Repeat CT chest abdomen/ pelvis showed increase in the size of previously seen lung nodules largest being 1.4 cm consistent with metastatic disease. PET/CT showed mild uptake in 2 lung nodules. Others were not hypermetabolic. No other evidence of metastatic disease  10. Repeat lung biopsy consistent with colon adenocarcinoma. 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 Repeat CT chest abdomen and pelvis in August 2019 showed mild increase in the size of his lung nodules. No new lung nodules are no new sites of distant metastatic disease.FOLFOX Avastin chemotherapy started on 06/22/2018.Patient completed 12 cycles of FOLFOX Avastin chemotherapy with continued response to his lung lesions. Oxaliplatin was dropped and patient is currently on 5-FU Avastin.He did not have mild progression of her disease in lung nodules and was switched to second line FOLFIRI Mvasi   Interval history-neuropathy is stable.  Patient is otherwise doing well and denies any complaints at this time.  He is active and independent of his ADLs and IADLs  ECOG PS- 1 Pain scale- 0   Review of systems- Review of Systems  Constitutional: Positive for malaise/fatigue. Negative for chills, fever and weight loss.  HENT: Negative for congestion, ear discharge and nosebleeds.   Eyes: Negative for blurred vision.  Respiratory: Negative for cough, hemoptysis, sputum production, shortness of breath and wheezing.   Cardiovascular: Negative for chest pain, palpitations, orthopnea and claudication.  Gastrointestinal: Negative for  abdominal pain, blood in stool, constipation,  diarrhea, heartburn, melena, nausea and vomiting.  Genitourinary: Negative for dysuria, flank pain, frequency, hematuria and urgency.  Musculoskeletal: Negative for back pain, joint pain and myalgias.  Skin: Negative for rash.  Neurological: Positive for sensory change (Peripheral neuropathy). Negative for dizziness, tingling, focal weakness, seizures, weakness and headaches.  Endo/Heme/Allergies: Does not bruise/bleed easily.  Psychiatric/Behavioral: Negative for depression and suicidal ideas. The patient does not have insomnia.       No Known Allergies   Past Medical History:  Diagnosis Date  . Cancer ([place])    skin cancer-nose w graft,also shoulder- basal cell per pt  . Cataract    r eye  . Chronic kidney disease    kidney stones 20 y ago per pt  . Colon cancer ([place]) 2018   Surgical resection and chemo tx's.  . Diabetes mellitus without complication ([place])   . Eczema   . History of kidney stones   . Hyperlipidemia   . Hypertension   . Vertigo      Past Surgical History:  Procedure Laterality Date  . CATARACT EXTRACTION Left   . COLECTOMY WITH COLOSTOMY CREATION/HARTMANN PROCEDURE N/A 02/06/2017   Procedure: COLECTOMY WITH COLOSTOMY CREATION/HARTMANN PROCEDURE;  Surgeon: [name], MD;  Location: [facility] ORS;  Service: General;  Laterality: N/A;  . COLON SURGERY    . COLONOSCOPY WITH PROPOFOL N/A 09/04/2017   Procedure: COLONOSCOPY WITH PROPOFOL;  Surgeon: Jonathon Bellows, MD;  Location: [facility]COPY;  Service: Gastroenterology;  Laterality: N/A;  . COLOSTOMY CLOSURE N/A 10/20/2017   Procedure: COLOSTOMY CLOSURE;  Surgeon: [name], MD;  Location: [facility] ORS;  Service: General;  Laterality: N/A;  . CYSTOSCOPY    . FLEXIBLE SIGMOIDOSCOPY N/A 02/06/2017   Procedure: FLEXIBLE SIGMOIDOSCOPY;  Surgeon: Christene Lye, MD;  Location: [facility] ENDOSCOPY;  Service: Endoscopy;  Laterality: N/A;  . PORTACATH PLACEMENT N/A 06/17/2018   Procedure: INSERTION PORT-A-CATH, WITH  FLUOROSCOPY;  Surgeon: [name], MD;  Location: [facility] ORS;  Service: General;  Laterality: N/A;    Social History   Socioeconomic History  . Marital status: Married    Spouse name: Not on file  . Number of children: Not on file  . Years of education: Not on file  . Highest education level: Not on file  Occupational History  . Not on file  Tobacco Use  . Smoking status: Never Smoker  . Smokeless tobacco: Never Used  Substance and Sexual Activity  . Alcohol use: No  . Drug use: No  . Sexual activity: Not Currently  Other Topics Concern  . Not on file  Social History Narrative  . Not on file   Social Determinants of Health   Financial Resource Strain:   . Difficulty of Paying Living Expenses:   Food Insecurity:   . Worried About Charity fundraiser in the Last Year:   . Arboriculturist in the Last Year:   Transportation Needs:   . Film/video editor (Medical):   Marland Kitchen Lack of Transportation (Non-Medical):   Physical Activity:   . Days of Exercise per Week:   . Minutes of Exercise per Session:   Stress:   . Feeling of Stress :   Social Connections:   . Frequency of Communication with Friends and Family:   . Frequency of Social Gatherings with Friends and Family:   . Attends Religious Services:   . Active Member of Clubs or Organizations:   . Attends Archivist  Meetings:   Marland Kitchen Marital Status:   Intimate Partner Violence:   . Fear of Current or Ex-Partner:   . Emotionally Abused:   Marland Kitchen Physically Abused:   . Sexually Abused:     Family History  Problem Relation Age of Onset  . Diabetes Mother   . AAA (abdominal aortic aneurysm) Mother   . Coronary artery disease Mother   . Diabetes Father   . Coronary artery disease Father   . Dementia Father   . Breast cancer Sister      Current Outpatient Medications:  .  acetaminophen (TYLENOL) 500 MG tablet, Take 500 mg by mouth every 6 (six) hours as needed (for pain.)., Disp: , Rfl:  .  aspirin EC 81  MG tablet, Take 81 mg by mouth daily., Disp: , Rfl:  .  Docusate Sodium (COLACE PO), Take 1 Dose by mouth as needed., Disp: , Rfl:  .  Dulaglutide 0.75 MG/0.5ML SOPN, Inject into the skin once a week. , Disp: , Rfl:  .  gabapentin (NEURONTIN) 300 MG capsule, TAKE 3 CAPSULES (900 MG TOTAL) BY MOUTH 2 (TWO) TIMES DAILY., Disp: 180 capsule, Rfl: 2 .  glimepiride (AMARYL) 2 MG tablet, Take 2 mg by mouth 2 (two) times daily before a meal. , Disp: , Rfl:  .  lidocaine-prilocaine (EMLA) cream, Place a small amount over cream over port 1 hour before each treatment, cover cream with saran wrap for clothing protection, Disp: 30 g, Rfl: 1 .  loratadine (CLARITIN) 10 MG tablet, Take 10 mg by mouth daily., Disp: , Rfl:  .  losartan (COZAAR) 25 MG tablet, Take 25 mg by mouth daily., Disp: , Rfl:  .  metFORMIN (GLUCOPHAGE) 1000 MG tablet, Take 1,000-1,500 tablets by mouth See admin instructions. TAKE 1 TABLET (1000 MG) BY MOUTH IN THE MORNING & TAKE 1.5 TABLETS (1500 MG) BY MOUTH AT SUPPER, Disp: , Rfl:  .  ondansetron (ZOFRAN) 8 MG tablet, Take 1 tablet (8 mg total) by mouth every 8 (eight) hours as needed for nausea or vomiting., Disp: 20 tablet, Rfl: 0 .  ONETOUCH VERIO test strip, CHECK FASTING BLOOD GLUCOSE ONCE DAILY, Disp: , Rfl:  .  pravastatin (PRAVACHOL) 20 MG tablet, Take 20 mg by mouth daily with supper. , Disp: , Rfl:  .  triamcinolone (KENALOG) 0.025 % cream, Apply 1 application topically daily as needed (FOR EZCEMA)., Disp: 30 g, Rfl: 2 No current facility-administered medications for this visit.  Facility-Administered Medications Ordered in Other Visits:  .  0.9 %  sodium chloride infusion, , Intravenous, Continuous, Sindy Guadeloupe, MD, Stopped at 07/27/18 1037 .  heparin lock flush 100 unit/mL, 500 Units, Intravenous, Once, Sindy Guadeloupe, MD .  sodium chloride flush (NS) 0.9 % injection 10 mL, 10 mL, Intravenous, PRN, Sindy Guadeloupe, MD, 10 mL at 07/27/18 0920 .  sodium chloride flush (NS) 0.9  % injection 10 mL, 10 mL, Intravenous, Once, Sindy Guadeloupe, MD  Physical exam:  Vitals:   04/03/20 0859  BP: (!) 156/59  Pulse: 70  Resp: 18  Temp: 97.7 F (36.5 C)  TempSrc: Oral  SpO2: 99%  Weight: 185 lb 1.6 oz (84 kg)   Physical Exam Constitutional:      General: He is not in acute distress. Skin:    General: Skin is warm and dry.  Neurological:     Mental Status: He is alert and oriented to person, place, and time.      CMP Latest Ref Rng &  Units 04/03/2020  Glucose 70 - 99 mg/dL 206(H)  BUN 8 - 23 mg/dL 17  Creatinine 0.61 - 1.24 mg/dL 1.10  Sodium 135 - 145 mmol/L 135  Potassium 3.5 - 5.1 mmol/L 4.5  Chloride 98 - 111 mmol/L 103  CO2 22 - 32 mmol/L 21(L)  Calcium 8.9 - 10.3 mg/dL 8.8(L)  Total Protein 6.5 - 8.1 g/dL 7.0  Total Bilirubin 0.3 - 1.2 mg/dL 0.6  Alkaline Phos 38 - 126 U/L 83  AST 15 - 41 U/L 32  ALT 0 - 44 U/L 25   CBC Latest Ref Rng & Units 04/03/2020  WBC 4.0 - 10.5 K/uL 8.2  Hemoglobin 13.0 - 17.0 g/dL 11.1(L)  Hematocrit 39.0 - 52.0 % 35.1(L)  Platelets 150 - 400 K/uL 191    No images are attached to the encounter.  CT Chest W Contrast  Result Date: 03/27/2020 CLINICAL DATA:  Metastatic colon cancer restaging, Keytruda EXAM: CT CHEST, ABDOMEN, AND PELVIS WITH CONTRAST TECHNIQUE: Multidetector CT imaging of the chest, abdomen and pelvis was performed following the standard protocol during bolus administration of intravenous contrast. CONTRAST:  135m OMNIPAQUE IOHEXOL 300 MG/ML SOLN, additional oral enteric contrast COMPARISON:  CT chest abdomen pelvis, 12/06/2019, 08/11/2019, MR abdomen, 01/03/2020 FINDINGS: CT CHEST FINDINGS Cardiovascular: Left chest port catheter. Aortic atherosclerosis. Three-vessel coronary artery calcifications aortic atherosclerosis. No pericardial effusion. Mediastinum/Nodes: No enlarged mediastinal, hilar, or axillary lymph nodes. Thyroid gland, trachea, and esophagus demonstrate no significant findings. Lungs/Pleura:  Slight interval enlargement of a spiculated perihilar nodule of the left upper lobe measuring 1.6 x 1.4 cm, previously 1.5 x 1.1 cm when measured similarly. No significant change in a 0.7 x 0.5 cm nodule of the peripheral left upper lobe (series 3, image 55). Minimal enlargement of a nodule of the right lower lobe measuring 0.9 x 0.7 cm, previously 0.8 x 0.7 cm when measured similarly (series 3, image 101). No new nodules. No pleural effusion or pneumothorax. Musculoskeletal: No chest wall mass or suspicious bone lesions identified. CT ABDOMEN PELVIS FINDINGS Hepatobiliary: Unchanged focal fatty deposition adjacent to the gallbladder fossa, better characterized by prior MR (series 2, image 56). Contracted gallbladder containing calcified gallstones. No biliary ductal dilatation. Pancreas: Unremarkable. No pancreatic ductal dilatation or surrounding inflammatory changes. Spleen: Normal in size without significant abnormality. Adrenals/Urinary Tract: Adrenal glands are unremarkable. Kidneys are normal, without renal calculi, solid lesion, or hydronephrosis. Bladder is unremarkable. Stomach/Bowel: Stomach is within normal limits. Appendix appears normal. Redemonstrated postoperative findings of sigmoid colon resection and reanastomosis Vascular/Lymphatic: Aortic atherosclerosis. No enlarged abdominal or pelvic lymph nodes. Reproductive: No mass or other abnormality. Other: Small bilateral fat containing inguinal hernias. No abdominopelvic ascites. Musculoskeletal: No acute or significant osseous findings. IMPRESSION: 1. Minimal enlargement of bilateral metastatic pulmonary nodules. No new nodules. 2. No evidence of metastatic disease in the abdomen or pelvis. 3. Redemonstrated postoperative findings of sigmoid colon resection and reanastomosis. 4. Cholelithiasis. 5. Coronary artery disease. Aortic Atherosclerosis (ICD10-I70.0). Electronically Signed   By: AEddie CandleM.D.   On: 03/27/2020 10:54   CT Abdomen Pelvis W  Contrast  Result Date: 03/27/2020 CLINICAL DATA:  Metastatic colon cancer restaging, Keytruda EXAM: CT CHEST, ABDOMEN, AND PELVIS WITH CONTRAST TECHNIQUE: Multidetector CT imaging of the chest, abdomen and pelvis was performed following the standard protocol during bolus administration of intravenous contrast. CONTRAST:  1033mOMNIPAQUE IOHEXOL 300 MG/ML SOLN, additional oral enteric contrast COMPARISON:  CT chest abdomen pelvis, 12/06/2019, 08/11/2019, MR abdomen, 01/03/2020 FINDINGS: CT CHEST FINDINGS Cardiovascular: Left chest port  catheter. Aortic atherosclerosis. Three-vessel coronary artery calcifications aortic atherosclerosis. No pericardial effusion. Mediastinum/Nodes: No enlarged mediastinal, hilar, or axillary lymph nodes. Thyroid gland, trachea, and esophagus demonstrate no significant findings. Lungs/Pleura: Slight interval enlargement of a spiculated perihilar nodule of the left upper lobe measuring 1.6 x 1.4 cm, previously 1.5 x 1.1 cm when measured similarly. No significant change in a 0.7 x 0.5 cm nodule of the peripheral left upper lobe (series 3, image 55). Minimal enlargement of a nodule of the right lower lobe measuring 0.9 x 0.7 cm, previously 0.8 x 0.7 cm when measured similarly (series 3, image 101). No new nodules. No pleural effusion or pneumothorax. Musculoskeletal: No chest wall mass or suspicious bone lesions identified. CT ABDOMEN PELVIS FINDINGS Hepatobiliary: Unchanged focal fatty deposition adjacent to the gallbladder fossa, better characterized by prior MR (series 2, image 56). Contracted gallbladder containing calcified gallstones. No biliary ductal dilatation. Pancreas: Unremarkable. No pancreatic ductal dilatation or surrounding inflammatory changes. Spleen: Normal in size without significant abnormality. Adrenals/Urinary Tract: Adrenal glands are unremarkable. Kidneys are normal, without renal calculi, solid lesion, or hydronephrosis. Bladder is unremarkable. Stomach/Bowel:  Stomach is within normal limits. Appendix appears normal. Redemonstrated postoperative findings of sigmoid colon resection and reanastomosis Vascular/Lymphatic: Aortic atherosclerosis. No enlarged abdominal or pelvic lymph nodes. Reproductive: No mass or other abnormality. Other: Small bilateral fat containing inguinal hernias. No abdominopelvic ascites. Musculoskeletal: No acute or significant osseous findings. IMPRESSION: 1. Minimal enlargement of bilateral metastatic pulmonary nodules. No new nodules. 2. No evidence of metastatic disease in the abdomen or pelvis. 3. Redemonstrated postoperative findings of sigmoid colon resection and reanastomosis. 4. Cholelithiasis. 5. Coronary artery disease. Aortic Atherosclerosis (ICD10-I70.0). Electronically Signed   By: Eddie Candle M.D.   On: 03/27/2020 10:54     Assessment and plan- Patient is a 79 y.o. male with stage IV colon cancer and lung metastases. He has had progression on FOLFOXZirabevchemotherapy. He is here for on treatment assessment prior to cycle 13 of FOLFIRI Zirabev chemotherapy and discussion of CT scan  I have reviewed CT chest abdomen pelvis images independently and discussed findings with the patient.At baseline he has low-volume metastatic disease to the lungs.  He has had this since 2018 when he was first diagnosed with stage II colon cancer.  At that time the lung nodules were too small to be biopsied.  Subsequently they were biopsy proven colon cancer.  Most recent CT scans revealed mild enlargement of these nodules.  Example a nodule that was 1.5 x 1.1 cm is now 1.6 x 1.4 cm.  Another nodule that was 0.8 x 0.7 cm now 0.9 x 0.7 cm.  We are not seeing any new nodules or other areas of new metastatic disease.    I am therefore inclined to continue present chemotherapy with FOLFIRI Zirabev.  I will plan to get repeat scans after 3 to 4 months and if there is continued progression I will consider changing treatment if need be.  Subsequent  lines of treatment including stavigra or Trifluridine have poor response rates.  Present regimen seems to be working on keeping the disease under reasonable control.  I will also send of NGS testing on his tumor specimen.  He is K-ras mutant and therefore not a candidate for Panitumumab  Blood pressure is presently stable and urine protein remains trace to receive Zirabev today  Counts are otherwise okay to proceed with next cycle of FOLFIRI Zirabev chemotherapy today.  Pump DC on day 3 and he receives [place].  He is  continuing to receive chemotherapy every 3 weeks for better tolerance as well as ongoing anemia.  I will see him in 3 weeks with labs and cycle 14 of treatment   Visit Diagnosis 1. Goals of care, counseling/discussion   2. Encounter for antineoplastic chemotherapy   3. Encounter for monoclonal antibody treatment for malignancy   4. Malignant neoplasm metastatic to lung, unspecified laterality ([place])   5. 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[place])      Dr. Randa Evens, MD, MPH [facility] [phone] 04/06/2020 8:09 AM

## 2020-04-10 ENCOUNTER — Telehealth: Payer: Self-pay | Admitting: *Deleted

## 2020-04-10 NOTE — Telephone Encounter (Signed)
Left message for pt. Would like to get estimate of cost for omniseq. I have left several messages about the cost without a rtn call from [place] a live person this am and can't get an estimate. Every one insurance is different, people have different copay and OOP and it is no way to know. It was suggested that pt could call and ask insurance. I have left this info on his voicemail and to know if we can move forward with the order or wait.

## 2020-04-13 ENCOUNTER — Telehealth: Payer: Self-pay | Admitting: *Deleted

## 2020-04-13 NOTE — Telephone Encounter (Signed)
I called the insurance [facility] and confirmed the codes for omniseq advance assay which were: CPT 88360-TC, [id number], [place], [id number], [place], and [id number] I spoke to [facility] and since pt has met his deductable this year it would be covered at 100 %. I then called pt and told him no charge to him and he gave verbal consent to send omniseq off to test

## 2020-04-24 ENCOUNTER — Inpatient Hospital Stay: Payer: Medicare Other

## 2020-04-24 ENCOUNTER — Inpatient Hospital Stay: Payer: Medicare Other | Attending: Oncology

## 2020-04-24 ENCOUNTER — Encounter: Payer: Self-pay | Admitting: Oncology

## 2020-04-24 ENCOUNTER — Inpatient Hospital Stay (HOSPITAL_BASED_OUTPATIENT_CLINIC_OR_DEPARTMENT_OTHER): Payer: Medicare Other | Admitting: Oncology

## 2020-04-24 ENCOUNTER — Other Ambulatory Visit: Payer: Self-pay

## 2020-04-24 VITALS — BP 138/59 | HR 64

## 2020-04-24 VITALS — BP 147/66 | HR 72 | Temp 98.1°F | Wt 184.8 lb

## 2020-04-24 DIAGNOSIS — R945 Abnormal results of liver function studies: Secondary | ICD-10-CM | POA: Insufficient documentation

## 2020-04-24 DIAGNOSIS — Z5112 Encounter for antineoplastic immunotherapy: Secondary | ICD-10-CM | POA: Insufficient documentation

## 2020-04-24 DIAGNOSIS — Z5111 Encounter for antineoplastic chemotherapy: Secondary | ICD-10-CM

## 2020-04-24 DIAGNOSIS — C7800 Secondary malignant neoplasm of unspecified lung: Secondary | ICD-10-CM | POA: Insufficient documentation

## 2020-04-24 DIAGNOSIS — Z79899 Other long term (current) drug therapy: Secondary | ICD-10-CM | POA: Insufficient documentation

## 2020-04-24 DIAGNOSIS — Z5189 Encounter for other specified aftercare: Secondary | ICD-10-CM | POA: Diagnosis not present

## 2020-04-24 DIAGNOSIS — D6481 Anemia due to antineoplastic chemotherapy: Secondary | ICD-10-CM | POA: Diagnosis not present

## 2020-04-24 DIAGNOSIS — G620 Drug-induced polyneuropathy: Secondary | ICD-10-CM | POA: Insufficient documentation

## 2020-04-24 DIAGNOSIS — C189 Malignant neoplasm of colon, unspecified: Secondary | ICD-10-CM

## 2020-04-24 DIAGNOSIS — T451X5A Adverse effect of antineoplastic and immunosuppressive drugs, initial encounter: Secondary | ICD-10-CM | POA: Insufficient documentation

## 2020-04-24 DIAGNOSIS — C187 Malignant neoplasm of sigmoid colon: Secondary | ICD-10-CM | POA: Diagnosis present

## 2020-04-24 LAB — CBC WITH DIFFERENTIAL/PLATELET
Abs Immature Granulocytes: 0.03 10*3/uL (ref 0.00–0.07)
Basophils Absolute: 0.1 10*3/uL (ref 0.0–0.1)
Basophils Relative: 1 %
Eosinophils Absolute: 0.3 10*3/uL (ref 0.0–0.5)
Eosinophils Relative: 4 %
HCT: 34.8 % — ABNORMAL LOW (ref 39.0–52.0)
Hemoglobin: 10.9 g/dL — ABNORMAL LOW (ref 13.0–17.0)
Immature Granulocytes: 0 %
Lymphocytes Relative: 23 %
Lymphs Abs: 1.8 10*3/uL (ref 0.7–4.0)
MCH: 28.5 pg (ref 26.0–34.0)
MCHC: 31.3 g/dL (ref 30.0–36.0)
MCV: 91.1 fL (ref 80.0–100.0)
Monocytes Absolute: 0.6 10*3/uL (ref 0.1–1.0)
Monocytes Relative: 8 %
Neutro Abs: 4.9 10*3/uL (ref 1.7–7.7)
Neutrophils Relative %: 64 %
Platelets: 161 10*3/uL (ref 150–400)
RBC: 3.82 MIL/uL — ABNORMAL LOW (ref 4.22–5.81)
RDW: 19.4 % — ABNORMAL HIGH (ref 11.5–15.5)
WBC: 7.7 10*3/uL (ref 4.0–10.5)
nRBC: 0 % (ref 0.0–0.2)

## 2020-04-24 LAB — COMPREHENSIVE METABOLIC PANEL
ALT: 49 U/L — ABNORMAL HIGH (ref 0–44)
AST: 58 U/L — ABNORMAL HIGH (ref 15–41)
Albumin: 3.9 g/dL (ref 3.5–5.0)
Alkaline Phosphatase: 86 U/L (ref 38–126)
Anion gap: 11 (ref 5–15)
BUN: 17 mg/dL (ref 8–23)
CO2: 21 mmol/L — ABNORMAL LOW (ref 22–32)
Calcium: 8.9 mg/dL (ref 8.9–10.3)
Chloride: 106 mmol/L (ref 98–111)
Creatinine, Ser: 1.08 mg/dL (ref 0.61–1.24)
GFR calc Af Amer: >60 mL/min (ref >60)
GFR calc non Af Amer: >60 mL/min (ref >60)
Glucose, Bld: 236 mg/dL — ABNORMAL HIGH (ref 70–99)
Potassium: 4.4 mmol/L (ref 3.5–5.1)
Sodium: 138 mmol/L (ref 135–145)
Total Bilirubin: 0.7 mg/dL (ref 0.3–1.2)
Total Protein: 6.9 g/dL (ref 6.5–8.1)

## 2020-04-24 LAB — PROTEIN, URINE, RANDOM: Total Protein, Urine: <6 mg/dL

## 2020-04-24 MED ORDER — SODIUM CHLORIDE 0.9 % IV SOLN
402.0000 mg/m2 | Freq: Once | INTRAVENOUS | Status: DC
  Filled 2020-04-24: qty 40

## 2020-04-24 MED ORDER — ATROPINE SULFATE 1 MG/ML IJ SOLN
0.5000 mg | Freq: Once | INTRAMUSCULAR | Status: AC | PRN
  Administered 2020-04-24: 0.5 mg via INTRAVENOUS
  Filled 2020-04-24: qty 1

## 2020-04-24 MED ORDER — FLUOROURACIL CHEMO INJECTION 2.5 GM/50ML
400.0000 mg/m2 | Freq: Once | INTRAVENOUS | Status: AC
  Administered 2020-04-24: 800 mg via INTRAVENOUS
  Filled 2020-04-24: qty 16

## 2020-04-24 MED ORDER — DEXAMETHASONE SODIUM PHOSPHATE 10 MG/ML IJ SOLN
10.0000 mg | Freq: Once | INTRAMUSCULAR | Status: AC
  Administered 2020-04-24: 10 mg via INTRAVENOUS
  Filled 2020-04-24: qty 1

## 2020-04-24 MED ORDER — SODIUM CHLORIDE 0.9 % IV SOLN
150.0000 mg/m2 | Freq: Once | INTRAVENOUS | Status: AC
  Administered 2020-04-24: 300 mg via INTRAVENOUS
  Filled 2020-04-24: qty 15

## 2020-04-24 MED ORDER — SODIUM CHLORIDE 0.9 % IV SOLN
402.0000 mg/m2 | Freq: Once | INTRAVENOUS | Status: AC
  Administered 2020-04-24: 800 mg via INTRAVENOUS
  Filled 2020-04-24: qty 40

## 2020-04-24 MED ORDER — SODIUM CHLORIDE 0.9 % IV SOLN
2400.0000 mg/m2 | INTRAVENOUS | Status: DC
  Administered 2020-04-24: 4800 mg via INTRAVENOUS
  Filled 2020-04-24: qty 96

## 2020-04-24 MED ORDER — LEUCOVORIN CALCIUM INJECTION 350 MG: 402.0000 mg/m2 | Freq: Once | INTRAVENOUS | Status: DC

## 2020-04-24 MED ORDER — SODIUM CHLORIDE 0.9 % IV SOLN
5.0000 mg/kg | Freq: Once | INTRAVENOUS | Status: AC
  Administered 2020-04-24: 400 mg via INTRAVENOUS
  Filled 2020-04-24: qty 16

## 2020-04-24 MED ORDER — SODIUM CHLORIDE 0.9% FLUSH
10.0000 mL | Freq: Once | INTRAVENOUS | Status: AC
  Administered 2020-04-24: 10 mL via INTRAVENOUS
  Filled 2020-04-24: qty 10

## 2020-04-24 MED ORDER — PALONOSETRON HCL INJECTION 0.25 MG/5ML
0.2500 mg | Freq: Once | INTRAVENOUS | Status: AC
  Administered 2020-04-24: 0.25 mg via INTRAVENOUS
  Filled 2020-04-24: qty 5

## 2020-04-24 MED ORDER — SODIUM CHLORIDE 0.9 % IV SOLN
Freq: Once | INTRAVENOUS | Status: AC
  Filled 2020-04-24: qty 250

## 2020-04-24 MED ORDER — IRINOTECAN HCL CHEMO INJECTION 100 MG/5ML: 150.0000 mg/m2 | Freq: Once | INTRAVENOUS | Status: DC

## 2020-04-26 ENCOUNTER — Telehealth: Payer: Self-pay | Admitting: *Deleted

## 2020-04-26 ENCOUNTER — Other Ambulatory Visit: Payer: Self-pay

## 2020-04-26 ENCOUNTER — Inpatient Hospital Stay: Payer: Medicare Other

## 2020-04-26 VITALS — BP 134/73 | HR 78 | Temp 98.2°F | Resp 18

## 2020-04-26 DIAGNOSIS — C189 Malignant neoplasm of colon, unspecified: Secondary | ICD-10-CM

## 2020-04-26 DIAGNOSIS — Z5111 Encounter for antineoplastic chemotherapy: Secondary | ICD-10-CM | POA: Diagnosis not present

## 2020-04-26 DIAGNOSIS — C7800 Secondary malignant neoplasm of unspecified lung: Secondary | ICD-10-CM

## 2020-04-26 MED ORDER — PEGFILGRASTIM-CBQV 6 MG/0.6ML ~~LOC~~ SOSY
6.0000 mg | PREFILLED_SYRINGE | Freq: Once | SUBCUTANEOUS | Status: AC
  Administered 2020-04-26: 6 mg via SUBCUTANEOUS
  Filled 2020-04-26: qty 0.6

## 2020-04-26 MED ORDER — HEPARIN SOD (PORK) LOCK FLUSH 100 UNIT/ML IV SOLN
500.0000 [IU] | Freq: Once | INTRAVENOUS | Status: AC | PRN
  Administered 2020-04-26: 500 [IU]
  Filled 2020-04-26: qty 5

## 2020-04-26 MED ORDER — SODIUM CHLORIDE 0.9% FLUSH
10.0000 mL | INTRAVENOUS | Status: DC | PRN
  Administered 2020-04-26: 10 mL
  Filled 2020-04-26: qty 10

## 2020-04-26 MED ORDER — HEPARIN SOD (PORK) LOCK FLUSH 100 UNIT/ML IV SOLN
INTRAVENOUS | Status: AC
  Filled 2020-04-26: qty 5

## 2020-04-26 NOTE — Progress Notes (Signed)
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 At that time he had progressive abdominal distention and no bowel movement for about one week. CT abdomen showed an apple core lesion in the descending/sigmoid colon.  2. Patient underwent Hartmann's procedure for obstructing sigmoid colon mass on 02/06/2017. Preoperative CEA was 5.0  3. 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 It is 0 out of 15 lymph nodes were positive for malignancy. Perineural and lymphovascular invasion was present. Margins were negative.pT3N0. MMR stable.  4. Given that he had high risk stage II colon cancer including he presented with obstruction and has LVI and perineural invasion- adjuvant xeloda chemotherapy was recommended for 6 months.   5.Patient had evidence of hand foot syndrome after cycle 3. Cycle 4 delayed by 1 week. Topical urea cream prescribed  6. Patient completed 8 cycles of adjuvant xeloda in October 2018. polps seen in sigmoid and decending colon but negative for malignancy  7. Repeat Ct abdomen after 8 cycles showed no metastatic disease. 6 mm lung nodule noted in RLL  8.Patient had a repeat colonoscopy in October 2018 which showed polyps in his transverse  and descending colon which were taken out and were negative for high-grade dysplasia or malignancy. Patient subsequently underwent colostomy takedown procedure by Dr. Burt Knack  9. Repeat CT chest abdomen/ pelvis showed increase in the size of previously seen lung nodules largest being 1.4 cm consistent with metastatic disease. PET/CT showed mild uptake in 2 lung nodules. Others were not hypermetabolic. No other evidence of metastatic disease  10. Repeat lung biopsy consistent with colon adenocarcinoma. Comprehensive RAS panel testing showed mutant KRAS  11.Given the slow rate of growth of his colon cancer and desire to maintain his quality of life plan was to start chemotherapy after he gets a 42-monthbreak in summer. Repeat CT chest abdomen and pelvis in August 2019 showed mild increase in the size of his lung nodules. No new lung nodules are no new sites of distant metastatic disease.FOLFOX Avastin chemotherapy started on 06/22/2018.Patient completed 12 cycles of FOLFOX Avastin chemotherapy with continued response to his lung lesions. Oxaliplatin was dropped and patient is currently on 5-FU Avastin.He did not have mild progression of her disease in lung nodules and was switched to second line FOLFIRI Mvasi  12. NGS pending  Interval history-patient continues to tolerate chemotherapy well.  Denies any significant nausea or vomiting.  He still has tingling numbness in his hands and feet for which he takes gabapentin and is currently stable.  ECOG PS- 1 Pain scale- 0   Review of systems- Review of Systems  Constitutional: Positive for malaise/fatigue. Negative for chills, fever and weight loss.  HENT: Negative for congestion, ear discharge and nosebleeds.   Eyes: Negative for blurred vision.  Respiratory: Negative for cough, hemoptysis, sputum production, shortness of breath and wheezing.   Cardiovascular: Negative for chest  pain, palpitations, orthopnea and claudication.    Gastrointestinal: Negative for abdominal pain, blood in stool, constipation, diarrhea, heartburn, melena, nausea and vomiting.  Genitourinary: Negative for dysuria, flank pain, frequency, hematuria and urgency.  Musculoskeletal: Negative for back pain, joint pain and myalgias.  Skin: Negative for rash.  Neurological: Positive for sensory change (Peripheral neuropathy). Negative for dizziness, tingling, focal weakness, seizures, weakness and headaches.  Endo/Heme/Allergies: Does not bruise/bleed easily.  Psychiatric/Behavioral: Negative for depression and suicidal ideas. The patient does not have insomnia.       No Known Allergies   Past Medical History:  Diagnosis Date  . Cancer ([place])    skin cancer-nose w graft,also shoulder- basal cell per pt  . Cataract    r eye  . Chronic kidney disease    kidney stones 20 y ago per pt  . Colon cancer ([place]) 2018   Surgical resection and chemo tx's.  . Diabetes mellitus without complication ([place])   . Eczema   . History of kidney stones   . Hyperlipidemia   . Hypertension   . Vertigo      Past Surgical History:  Procedure Laterality Date  . CATARACT EXTRACTION Left   . COLECTOMY WITH COLOSTOMY CREATION/HARTMANN PROCEDURE N/A 02/06/2017   Procedure: COLECTOMY WITH COLOSTOMY CREATION/HARTMANN PROCEDURE;  Surgeon: [name], MD;  Location: [facility] ORS;  Service: General;  Laterality: N/A;  . COLON SURGERY    . COLONOSCOPY WITH PROPOFOL N/A 09/04/2017   Procedure: COLONOSCOPY WITH PROPOFOL;  Surgeon: Jonathon Bellows, MD;  Location: [facility] ENDOSCOPY;  Service: Gastroenterology;  Laterality: N/A;  . COLOSTOMY CLOSURE N/A 10/20/2017   Procedure: COLOSTOMY CLOSURE;  Surgeon: [name], MD;  Location: [facility] ORS;  Service: General;  Laterality: N/A;  . CYSTOSCOPY    . FLEXIBLE SIGMOIDOSCOPY N/A 02/06/2017   Procedure: FLEXIBLE SIGMOIDOSCOPY;  Surgeon: Christene Lye, MD;  Location: [facility] ENDOSCOPY;  Service: Endoscopy;  Laterality: N/A;   . PORTACATH PLACEMENT N/A 06/17/2018   Procedure: INSERTION PORT-A-CATH, WITH FLUOROSCOPY;  Surgeon: [name], MD;  Location: [facility] ORS;  Service: General;  Laterality: N/A;    Social History   Socioeconomic History  . Marital status: Married    Spouse name: Not on file  . Number of children: Not on file  . Years of education: Not on file  . Highest education level: Not on file  Occupational History  . Not on file  Tobacco Use  . Smoking status: Never Smoker  . Smokeless tobacco: Never Used  Vaping Use  . Vaping Use: Never used  Substance and Sexual Activity  . Alcohol use: No  . Drug use: No  . Sexual activity: Not Currently  Other Topics Concern  . Not on file  Social History Narrative  . Not on file   Social Determinants of Health   Financial Resource Strain:   . Difficulty of Paying Living Expenses:   Food Insecurity:   . Worried About Charity fundraiser in the Last Year:   . Arboriculturist in the Last Year:   Transportation Needs:   . Film/video editor (Medical):   Marland Kitchen Lack of Transportation (Non-Medical):   Physical Activity:   . Days of Exercise per Week:   . Minutes of Exercise per Session:   Stress:   . Feeling of Stress :   Social Connections:   . Frequency of Communication with Friends and Family:   . Frequency of Social Gatherings with Friends and Family:   .  Attends Religious Services:   . Active Member of Clubs or Organizations:   . Attends Archivist Meetings:   Marland Kitchen Marital Status:   Intimate Partner Violence:   . Fear of Current or Ex-Partner:   . Emotionally Abused:   Marland Kitchen Physically Abused:   . Sexually Abused:     Family History  Problem Relation Age of Onset  . Diabetes Mother   . AAA (abdominal aortic aneurysm) Mother   . Coronary artery disease Mother   . Diabetes Father   . Coronary artery disease Father   . Dementia Father   . Breast cancer Sister      Current Outpatient Medications:  .  acetaminophen  (TYLENOL) 500 MG tablet, Take 500 mg by mouth every 6 (six) hours as needed (for pain.)., Disp: , Rfl:  .  aspirin EC 81 MG tablet, Take 81 mg by mouth daily., Disp: , Rfl:  .  Docusate Sodium (COLACE PO), Take 1 Dose by mouth as needed., Disp: , Rfl:  .  Dulaglutide 0.75 MG/0.5ML SOPN, Inject into the skin once a week. , Disp: , Rfl:  .  gabapentin (NEURONTIN) 300 MG capsule, TAKE 3 CAPSULES (900 MG TOTAL) BY MOUTH 2 (TWO) TIMES DAILY., Disp: 180 capsule, Rfl: 2 .  glimepiride (AMARYL) 2 MG tablet, Take 2 mg by mouth 2 (two) times daily before a meal. , Disp: , Rfl:  .  lidocaine-prilocaine (EMLA) cream, Place a small amount over cream over port 1 hour before each treatment, cover cream with saran wrap for clothing protection, Disp: 30 g, Rfl: 1 .  loratadine (CLARITIN) 10 MG tablet, Take 10 mg by mouth daily., Disp: , Rfl:  .  losartan (COZAAR) 25 MG tablet, Take 25 mg by mouth daily., Disp: , Rfl:  .  metFORMIN (GLUCOPHAGE) 1000 MG tablet, Take 1,000-1,500 tablets by mouth See admin instructions. TAKE 1 TABLET (1000 MG) BY MOUTH IN THE MORNING & TAKE 1.5 TABLETS (1500 MG) BY MOUTH AT SUPPER, Disp: , Rfl:  .  ondansetron (ZOFRAN) 8 MG tablet, Take 1 tablet (8 mg total) by mouth every 8 (eight) hours as needed for nausea or vomiting., Disp: 20 tablet, Rfl: 0 .  ONETOUCH VERIO test strip, CHECK FASTING BLOOD GLUCOSE ONCE DAILY, Disp: , Rfl:  .  pravastatin (PRAVACHOL) 20 MG tablet, Take 20 mg by mouth daily with supper. , Disp: , Rfl:  .  triamcinolone (KENALOG) 0.025 % cream, Apply 1 application topically daily as needed (FOR EZCEMA)., Disp: 30 g, Rfl: 2 No current facility-administered medications for this visit.  Facility-Administered Medications Ordered in Other Visits:  .  0.9 %  sodium chloride infusion, , Intravenous, Continuous, Sindy Guadeloupe, MD, Stopped at 07/27/18 1037 .  heparin lock flush 100 unit/mL, 500 Units, Intravenous, Once, Sindy Guadeloupe, MD .  sodium chloride flush (NS) 0.9  % injection 10 mL, 10 mL, Intravenous, PRN, Sindy Guadeloupe, MD, 10 mL at 07/27/18 0920 .  sodium chloride flush (NS) 0.9 % injection 10 mL, 10 mL, Intravenous, Once, Sindy Guadeloupe, MD  Physical exam:  Vitals:   04/24/20 0836  BP: (!) 147/66  Pulse: 72  Temp: 98.1 F (36.7 C)  TempSrc: Tympanic  SpO2: 98%  Weight: 184 lb 12.8 oz (83.8 kg)   Physical Exam Constitutional:      General: He is not in acute distress. Cardiovascular:     Rate and Rhythm: Normal rate and regular rhythm.     Heart sounds: Normal heart sounds.  Pulmonary:     Effort: Pulmonary effort is normal.     Breath sounds: Normal breath sounds.  Abdominal:     General: Bowel sounds are normal.     Palpations: Abdomen is soft.  Skin:    General: Skin is warm and dry.  Neurological:     Mental Status: He is alert and oriented to person, place, and time.      CMP Latest Ref Rng & Units 04/24/2020  Glucose 70 - 99 mg/dL 236(H)  BUN 8 - 23 mg/dL 17  Creatinine 0.61 - 1.24 mg/dL 1.08  Sodium 135 - 145 mmol/L 138  Potassium 3.5 - 5.1 mmol/L 4.4  Chloride 98 - 111 mmol/L 106  CO2 22 - 32 mmol/L 21(L)  Calcium 8.9 - 10.3 mg/dL 8.9  Total Protein 6.5 - 8.1 g/dL 6.9  Total Bilirubin 0.3 - 1.2 mg/dL 0.7  Alkaline Phos 38 - 126 U/L 86  AST 15 - 41 U/L 58(H)  ALT 0 - 44 U/L 49(H)   CBC Latest Ref Rng & Units 04/24/2020  WBC 4.0 - 10.5 K/uL 7.7  Hemoglobin 13.0 - 17.0 g/dL 10.9(L)  Hematocrit 39 - 52 % 34.8(L)  Platelets 150 - 400 K/uL 161      Assessment and plan- Patient is a 79 y.o. male with stage IV colon cancer and lung metastases. He has had progression on FOLFOXZirabevchemotherapy. He is here for on treatment assessment prior to cycle 14 of FOLFIRI Zirabev chemotherapy  Counts okay to proceed with cycle 14 of FOLFIRI Zirabev chemotherapy today.  He is gradually getting more anemic.  His baseline hemoglobin which was close to 12 has now drifted down to 10.9.  This is likely chemo induced anemia  since he has received chemotherapy for a while now.  Patient has a beach trip coming up in 3 weeks and I will therefore plan to see him in 2 weeks for cycle 15.  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  If his anemia worsens I will consider ordering them again   Visit Diagnosis 1. Malignant neoplasm metastatic to lung, unspecified laterality ([place])   2. Encounter for antineoplastic chemotherapy   3. 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[place])   4. Antineoplastic chemotherapy induced anemia   5. Chemotherapy-induced peripheral neuropathy ([place])      Dr. Randa Evens, MD, MPH [facility] at [facility] [phone] 04/26/2020 1:34 PM

## 2020-04-26 NOTE — Telephone Encounter (Signed)
I called patient back to let them know that he is free to go to the dentist as long as he only gets a feeling.  If he has any other teeth that need to be repaired besides a feeling Dr. Thereasa Parkin his dentist will need to talk to Dr. [name].  There are some things that the dentist will do that we will need to take him off of his Mvasi for about 4 weeks before he can get dental work.  But fillings are perfectly fine to do.  Patient will call when he gets back from the beach to get a dental appointment.  He also will call us when he knows about the appointment so that we can talk to the dentist office

## 2020-05-04 ENCOUNTER — Encounter: Payer: Self-pay | Admitting: Oncology

## 2020-05-07 ENCOUNTER — Encounter: Payer: Self-pay | Admitting: Oncology

## 2020-05-08 ENCOUNTER — Inpatient Hospital Stay: Payer: Medicare Other

## 2020-05-08 ENCOUNTER — Inpatient Hospital Stay (HOSPITAL_BASED_OUTPATIENT_CLINIC_OR_DEPARTMENT_OTHER): Payer: Medicare Other | Admitting: Oncology

## 2020-05-08 ENCOUNTER — Other Ambulatory Visit: Payer: Self-pay

## 2020-05-08 ENCOUNTER — Telehealth: Payer: Self-pay

## 2020-05-08 ENCOUNTER — Encounter: Payer: Self-pay | Admitting: Oncology

## 2020-05-08 VITALS — BP 143/57 | HR 68 | Temp 98.0°F | Resp 18 | Wt 186.0 lb

## 2020-05-08 DIAGNOSIS — C7800 Secondary malignant neoplasm of unspecified lung: Secondary | ICD-10-CM

## 2020-05-08 DIAGNOSIS — Z5111 Encounter for antineoplastic chemotherapy: Secondary | ICD-10-CM

## 2020-05-08 DIAGNOSIS — G620 Drug-induced polyneuropathy: Secondary | ICD-10-CM | POA: Diagnosis not present

## 2020-05-08 DIAGNOSIS — R7989 Other specified abnormal findings of blood chemistry: Secondary | ICD-10-CM

## 2020-05-08 DIAGNOSIS — D6481 Anemia due to antineoplastic chemotherapy: Secondary | ICD-10-CM | POA: Diagnosis not present

## 2020-05-08 DIAGNOSIS — T451X5A Adverse effect of antineoplastic and immunosuppressive drugs, initial encounter: Secondary | ICD-10-CM

## 2020-05-08 DIAGNOSIS — C187 Malignant neoplasm of sigmoid colon: Secondary | ICD-10-CM

## 2020-05-08 DIAGNOSIS — C189 Malignant neoplasm of colon, unspecified: Secondary | ICD-10-CM

## 2020-05-08 DIAGNOSIS — R945 Abnormal results of liver function studies: Secondary | ICD-10-CM

## 2020-05-08 LAB — CBC WITH DIFFERENTIAL/PLATELET
Abs Immature Granulocytes: 0.08 10*3/uL — ABNORMAL HIGH (ref 0.00–0.07)
Basophils Absolute: 0.1 10*3/uL (ref 0.0–0.1)
Basophils Relative: 1 %
Eosinophils Absolute: 0.3 10*3/uL (ref 0.0–0.5)
Eosinophils Relative: 4 %
HCT: 33.6 % — ABNORMAL LOW (ref 39.0–52.0)
Hemoglobin: 10.5 g/dL — ABNORMAL LOW (ref 13.0–17.0)
Immature Granulocytes: 1 %
Lymphocytes Relative: 30 %
Lymphs Abs: 2.1 10*3/uL (ref 0.7–4.0)
MCH: 28.6 pg (ref 26.0–34.0)
MCHC: 31.3 g/dL (ref 30.0–36.0)
MCV: 91.6 fL (ref 80.0–100.0)
Monocytes Absolute: 0.6 10*3/uL (ref 0.1–1.0)
Monocytes Relative: 8 %
Neutro Abs: 4 10*3/uL (ref 1.7–7.7)
Neutrophils Relative %: 56 %
Platelets: 164 10*3/uL (ref 150–400)
RBC: 3.67 MIL/uL — ABNORMAL LOW (ref 4.22–5.81)
RDW: 18.8 % — ABNORMAL HIGH (ref 11.5–15.5)
WBC: 7.2 10*3/uL (ref 4.0–10.5)
nRBC: 0 % (ref 0.0–0.2)

## 2020-05-08 LAB — COMPREHENSIVE METABOLIC PANEL
ALT: 40 U/L (ref 0–44)
AST: 45 U/L — ABNORMAL HIGH (ref 15–41)
Albumin: 3.8 g/dL (ref 3.5–5.0)
Alkaline Phosphatase: 99 U/L (ref 38–126)
Anion gap: 8 (ref 5–15)
BUN: 13 mg/dL (ref 8–23)
CO2: 23 mmol/L (ref 22–32)
Calcium: 8.9 mg/dL (ref 8.9–10.3)
Chloride: 106 mmol/L (ref 98–111)
Creatinine, Ser: 1.05 mg/dL (ref 0.61–1.24)
GFR calc Af Amer: >60 mL/min (ref >60)
GFR calc non Af Amer: >60 mL/min (ref >60)
Glucose, Bld: 222 mg/dL — ABNORMAL HIGH (ref 70–99)
Potassium: 4.3 mmol/L (ref 3.5–5.1)
Sodium: 137 mmol/L (ref 135–145)
Total Bilirubin: 0.6 mg/dL (ref 0.3–1.2)
Total Protein: 6.8 g/dL (ref 6.5–8.1)

## 2020-05-08 LAB — PROTEIN, URINE, RANDOM: Total Protein, Urine: <6 mg/dL

## 2020-05-08 MED ORDER — DEXAMETHASONE SODIUM PHOSPHATE 10 MG/ML IJ SOLN
10.0000 mg | Freq: Once | INTRAMUSCULAR | Status: AC
  Administered 2020-05-08: 10 mg via INTRAVENOUS
  Filled 2020-05-08: qty 1

## 2020-05-08 MED ORDER — SODIUM CHLORIDE 0.9 % IV SOLN
2400.0000 mg/m2 | INTRAVENOUS | Status: DC
  Administered 2020-05-08: 4800 mg via INTRAVENOUS
  Filled 2020-05-08: qty 96

## 2020-05-08 MED ORDER — PALONOSETRON HCL INJECTION 0.25 MG/5ML
0.2500 mg | Freq: Once | INTRAVENOUS | Status: AC
  Administered 2020-05-08: 0.25 mg via INTRAVENOUS
  Filled 2020-05-08: qty 5

## 2020-05-08 MED ORDER — LEUCOVORIN CALCIUM INJECTION 350 MG
402.0000 mg/m2 | Freq: Once | INTRAVENOUS | Status: AC
  Administered 2020-05-08: 800 mg via INTRAVENOUS
  Filled 2020-05-08: qty 40

## 2020-05-08 MED ORDER — ATROPINE SULFATE 1 MG/ML IJ SOLN
0.5000 mg | Freq: Once | INTRAMUSCULAR | Status: AC | PRN
  Administered 2020-05-08: 0.5 mg via INTRAVENOUS
  Filled 2020-05-08: qty 1

## 2020-05-08 MED ORDER — SODIUM CHLORIDE 0.9 % IV SOLN
Freq: Once | INTRAVENOUS | Status: AC
  Filled 2020-05-08: qty 250

## 2020-05-08 MED ORDER — SODIUM CHLORIDE 0.9 % IV SOLN
5.0000 mg/kg | Freq: Once | INTRAVENOUS | Status: AC
  Administered 2020-05-08: 400 mg via INTRAVENOUS
  Filled 2020-05-08: qty 16

## 2020-05-08 MED ORDER — IRINOTECAN HCL CHEMO INJECTION 100 MG/5ML
150.0000 mg/m2 | Freq: Once | INTRAVENOUS | Status: AC
  Administered 2020-05-08: 300 mg via INTRAVENOUS
  Filled 2020-05-08: qty 15

## 2020-05-08 MED ORDER — FLUOROURACIL CHEMO INJECTION 2.5 GM/50ML
400.0000 mg/m2 | Freq: Once | INTRAVENOUS | Status: AC
  Administered 2020-05-08: 800 mg via INTRAVENOUS
  Filled 2020-05-08: qty 16

## 2020-05-08 MED ORDER — SODIUM CHLORIDE 0.9% FLUSH
10.0000 mL | INTRAVENOUS | Status: DC | PRN
  Administered 2020-05-08: 10 mL via INTRAVENOUS
  Filled 2020-05-08: qty 10

## 2020-05-08 NOTE — Progress Notes (Addendum)
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 At that time he had progressive abdominal distention and no bowel movement for about one week. CT abdomen showed an apple core lesion in the descending/sigmoid colon.  2. Patient underwent Hartmann's procedure for obstructing sigmoid colon mass on 02/06/2017. Preoperative CEA was 5.0  3. 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 It is 0 out of 15 lymph nodes were positive for malignancy. Perineural and lymphovascular invasion was present. Margins were negative.pT3N0. MMR stable.  4. Given that he had high risk stage II colon cancer including he presented with obstruction and has LVI and perineural invasion- adjuvant xeloda chemotherapy was recommended for 6 months.   5.Patient had evidence of hand foot syndrome after cycle 3. Cycle 4 delayed by 1 week. Topical urea cream prescribed  6. Patient completed 8 cycles of adjuvant xeloda in October 2018. polps seen in sigmoid and decending colon but negative for malignancy  7. Repeat Ct abdomen after 8 cycles showed no metastatic disease. 6 mm lung nodule noted in RLL  8.Patient had a repeat colonoscopy in October 2018 which showed polyps in his transverse and  descending colon which were taken out and were negative for high-grade dysplasia or malignancy. Patient subsequently underwent colostomy takedown procedure by Dr. Burt Knack  9. Repeat CT chest abdomen/ pelvis showed increase in the size of previously seen lung nodules largest being 1.4 cm consistent with metastatic disease. PET/CT showed mild uptake in 2 lung nodules. Others were not hypermetabolic. No other evidence of metastatic disease  10. Repeat lung biopsy consistent with colon adenocarcinoma. 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 Repeat CT chest abdomen and pelvis in August 2019 showed mild increase in the size of his lung nodules. No new lung nodules are no new sites of distant metastatic disease.FOLFOX Avastin chemotherapy started on 06/22/2018.Patient completed 12 cycles of FOLFOX Avastin chemotherapy with continued response to his lung lesions. Oxaliplatin was dropped and patient is currently on 5-FU Avastin.He did not have mild progression of her disease in lung nodules and was switched to second line FOLFIRI Mvasi  12. NGS showed no actionable mutations.  PD-L1 less than 1%.  No evidence of HER2 and NTRKmutations.  Interval history-overall he is doing well.  Appetite and weight have remained stable.  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  He has an upcoming beach trip coming up for 1 week.  ECOG PS- 1 Pain scale- 0   Review of systems- Review of Systems  Constitutional: Negative for chills, fever, malaise/fatigue and weight loss.  HENT: Negative for congestion, ear discharge and nosebleeds.   Eyes: Negative for blurred vision.  Respiratory: Negative for cough, hemoptysis, sputum  production, shortness of breath and wheezing.   Cardiovascular: Negative for chest  pain, palpitations, orthopnea and claudication.  Gastrointestinal: Negative for abdominal pain, blood in stool, constipation, diarrhea, heartburn, melena, nausea and vomiting.  Genitourinary: Negative for dysuria, flank pain, frequency, hematuria and urgency.  Musculoskeletal: Negative for back pain, joint pain and myalgias.  Skin: Negative for rash.  Neurological: Positive for sensory change (Peripheral neuropathy). Negative for dizziness, tingling, focal weakness, seizures, weakness and headaches.  Endo/Heme/Allergies: Does not bruise/bleed easily.  Psychiatric/Behavioral: Negative for depression and suicidal ideas. The patient does not have insomnia.       No Known Allergies   Past Medical History:  Diagnosis Date   Cancer ([place])    skin cancer-nose w graft,also shoulder- basal cell per pt   Cataract    r eye   Chronic kidney disease    kidney stones 20 y ago per pt   Colon cancer ([place]) 2018   Surgical resection and chemo tx's.   Diabetes mellitus without complication ([place])    Eczema    History of kidney stones    Hyperlipidemia    Hypertension    Vertigo      Past Surgical History:  Procedure Laterality Date   CATARACT EXTRACTION Left    COLECTOMY WITH COLOSTOMY CREATION/HARTMANN PROCEDURE N/A 02/06/2017   Procedure: COLECTOMY WITH COLOSTOMY CREATION/HARTMANN PROCEDURE;  Surgeon: [name], MD;  Location: [facility] ORS;  Service: General;  Laterality: N/A;   COLON SURGERY     COLONOSCOPY WITH PROPOFOL N/A 09/04/2017   Procedure: COLONOSCOPY WITH PROPOFOL;  Surgeon: Jonathon Bellows, MD;  Location: [facility] ENDOSCOPY;  Service: Gastroenterology;  Laterality: N/A;   COLOSTOMY CLOSURE N/A 10/20/2017   Procedure: COLOSTOMY CLOSURE;  Surgeon: [name], MD;  Location: [facility] ORS;  Service: General;  Laterality: N/A;   CYSTOSCOPY     FLEXIBLE SIGMOIDOSCOPY N/A 02/06/2017   Procedure: FLEXIBLE SIGMOIDOSCOPY;  Surgeon: Christene Lye, MD;  Location: [facility]  ENDOSCOPY;  Service: Endoscopy;  Laterality: N/A;   PORTACATH PLACEMENT N/A 06/17/2018   Procedure: INSERTION PORT-A-CATH, WITH FLUOROSCOPY;  Surgeon: [name], MD;  Location: [facility] ORS;  Service: General;  Laterality: N/A;    Social History   Socioeconomic History   Marital status: Married    Spouse name: Not on file   Number of children: Not on file   Years of education: Not on file   Highest education level: Not on file  Occupational History   Not on file  Tobacco Use   Smoking status: Never Smoker   Smokeless tobacco: Never Used  Vaping Use   Vaping Use: Never used  Substance and Sexual Activity   Alcohol use: No   Drug use: No   Sexual activity: Not Currently  Other Topics Concern   Not on file  Social History Narrative   Not on file   Social Determinants of Health   Financial Resource Strain:    Difficulty of Paying Living Expenses:   Food Insecurity:    Worried About Charity fundraiser in the Last Year:    Arboriculturist in the Last Year:   Transportation Needs:    Film/video editor (Medical):    Lack of Transportation (Non-Medical):   Physical Activity:    Days of Exercise per Week:    Minutes of Exercise per Session:   Stress:    Feeling of Stress :   Social Connections:    Frequency of Communication with Friends and Family:  Frequency of Social Gatherings with Friends and Family:    Attends Religious Services:    Active Member of Clubs or Organizations:    Attends Music therapist:    Marital Status:   Intimate Partner Violence:    Fear of Current or Ex-Partner:    Emotionally Abused:    Physically Abused:    Sexually Abused:     Family History  Problem Relation Age of Onset   Diabetes Mother    AAA (abdominal aortic aneurysm) Mother    Coronary artery disease Mother    Diabetes Father    Coronary artery disease Father    Dementia Father    Breast cancer Sister       Current Outpatient Medications:    acetaminophen (TYLENOL) 500 MG tablet, Take 500 mg by mouth every 6 (six) hours as needed (for pain.)., Disp: , Rfl:    aspirin EC 81 MG tablet, Take 81 mg by mouth daily., Disp: , Rfl:    Docusate Sodium (COLACE PO), Take 1 Dose by mouth as needed., Disp: , Rfl:    Dulaglutide 0.75 MG/0.5ML SOPN, Inject into the skin once a week. , Disp: , Rfl:    gabapentin (NEURONTIN) 300 MG capsule, TAKE 3 CAPSULES (900 MG TOTAL) BY MOUTH 2 (TWO) TIMES DAILY., Disp: 180 capsule, Rfl: 2   glimepiride (AMARYL) 2 MG tablet, Take 2 mg by mouth 2 (two) times daily before a meal. , Disp: , Rfl:    lidocaine-prilocaine (EMLA) cream, Place a small amount over cream over port 1 hour before each treatment, cover cream with saran wrap for clothing protection, Disp: 30 g, Rfl: 1   loratadine (CLARITIN) 10 MG tablet, Take 10 mg by mouth daily., Disp: , Rfl:    losartan (COZAAR) 25 MG tablet, Take 25 mg by mouth daily., Disp: , Rfl:    metFORMIN (GLUCOPHAGE) 1000 MG tablet, Take 1,000-1,500 tablets by mouth See admin instructions. TAKE 1 TABLET (1000 MG) BY MOUTH IN THE MORNING & TAKE 1.5 TABLETS (1500 MG) BY MOUTH AT SUPPER, Disp: , Rfl:    ondansetron (ZOFRAN) 8 MG tablet, Take 1 tablet (8 mg total) by mouth every 8 (eight) hours as needed for nausea or vomiting., Disp: 20 tablet, Rfl: 0   ONETOUCH VERIO test strip, CHECK FASTING BLOOD GLUCOSE ONCE DAILY, Disp: , Rfl:    pravastatin (PRAVACHOL) 20 MG tablet, Take 20 mg by mouth daily with supper. , Disp: , Rfl:    triamcinolone (KENALOG) 0.025 % cream, Apply 1 application topically daily as needed (FOR EZCEMA)., Disp: 30 g, Rfl: 2 No current facility-administered medications for this visit.  Facility-Administered Medications Ordered in Other Visits:    0.9 %  sodium chloride infusion, , Intravenous, Continuous, Sindy Guadeloupe, MD, Stopped at 07/27/18 1037   heparin lock flush 100 unit/mL, 500 Units, Intravenous,  Once, Sindy Guadeloupe, MD   sodium chloride flush (NS) 0.9 % injection 10 mL, 10 mL, Intravenous, PRN, Sindy Guadeloupe, MD, 10 mL at 07/27/18 0920   sodium chloride flush (NS) 0.9 % injection 10 mL, 10 mL, Intravenous, Once, Sindy Guadeloupe, MD   sodium chloride flush (NS) 0.9 % injection 10 mL, 10 mL, Intravenous, PRN, Sindy Guadeloupe, MD, 10 mL at 05/08/20 0809  Physical exam:  Vitals:   05/08/20 0846  BP: (!) 143/57  Pulse: 68  Resp: 18  Temp: 98 F (36.7 C)  TempSrc: Oral  SpO2: 98%  Weight: 186 lb (84.4 kg)   Physical  Exam Cardiovascular:     Rate and Rhythm: Regular rhythm.  Pulmonary:     Effort: Pulmonary effort is normal.     Breath sounds: Normal breath sounds.  Abdominal:     General: Bowel sounds are normal.  Skin:    General: Skin is warm and dry.  Neurological:     Mental Status: He is alert and oriented to person, place, and time.      CMP Latest Ref Rng & Units 05/08/2020  Glucose 70 - 99 mg/dL 222(H)  BUN 8 - 23 mg/dL 13  Creatinine 0.61 - 1.24 mg/dL 1.05  Sodium 135 - 145 mmol/L 137  Potassium 3.5 - 5.1 mmol/L 4.3  Chloride 98 - 111 mmol/L 106  CO2 22 - 32 mmol/L 23  Calcium 8.9 - 10.3 mg/dL 8.9  Total Protein 6.5 - 8.1 g/dL 6.8  Total Bilirubin 0.3 - 1.2 mg/dL 0.6  Alkaline Phos 38 - 126 U/L 99  AST 15 - 41 U/L 45(H)  ALT 0 - 44 U/L 40   CBC Latest Ref Rng & Units 05/08/2020  WBC 4.0 - 10.5 K/uL 7.2  Hemoglobin 13.0 - 17.0 g/dL 10.5(L)  Hematocrit 39 - 52 % 33.6(L)  Platelets 150 - 400 K/uL 164    Assessment and plan- Patient is a 79 y.o. male with stage IV colon cancer and lung metastases. He has had progression on FOLFOXZirabevchemotherapy. He is here for on treatment assessment prior to cycle 15 of FOLFIRI Zirabev chemotherapy  Counts okay to proceed with cycle 15 of FOLFIRI Zirabev chemotherapy today.  We have been doing chemotherapy every 3 weeks for better tolerance and for his counts to hold off.  He will come on day 3 for pump  disconnect and receive Udenyca.  I will see him back in 3 weeks with CBC with differential and CMP for cycle 16.  Chemo-induced anemia: Currently stable around 10.  Continue to monitor  Chemo-induced peripheral neuropathy: Continue gabapentin  Urine protein remains trace and blood pressure is acceptable to get Zirabev today.  Scans after 2 months  Abnormal LFTs: ALT has normalized and AST has improved.  Likely secondary to chemotherapy.  Continue to monitor   Visit Diagnosis 1. Encounter for antineoplastic chemotherapy   2. Chemotherapy-induced peripheral neuropathy ([place])   3. Antineoplastic chemotherapy induced anemia   4. Malignant neoplasm of sigmoid colon [facility])      Dr. Randa Evens, MD, MPH [facility] [phone] 05/08/2020 9:18 AM

## 2020-05-08 NOTE — Progress Notes (Signed)
Patient here for oncology follow-up appointment, expresses concerns of numbness from "neuropathy not getting better".

## 2020-05-10 ENCOUNTER — Inpatient Hospital Stay: Payer: Medicare Other | Attending: Oncology

## 2020-05-10 ENCOUNTER — Other Ambulatory Visit: Payer: Self-pay

## 2020-05-10 VITALS — BP 153/77 | HR 69 | Temp 97.0°F | Resp 18

## 2020-05-10 DIAGNOSIS — Z5111 Encounter for antineoplastic chemotherapy: Secondary | ICD-10-CM | POA: Insufficient documentation

## 2020-05-10 DIAGNOSIS — C187 Malignant neoplasm of sigmoid colon: Secondary | ICD-10-CM | POA: Diagnosis present

## 2020-05-10 DIAGNOSIS — Z5189 Encounter for other specified aftercare: Secondary | ICD-10-CM | POA: Diagnosis not present

## 2020-05-10 DIAGNOSIS — C7800 Secondary malignant neoplasm of unspecified lung: Secondary | ICD-10-CM | POA: Insufficient documentation

## 2020-05-10 DIAGNOSIS — C189 Malignant neoplasm of colon, unspecified: Secondary | ICD-10-CM

## 2020-05-10 MED ORDER — SODIUM CHLORIDE 0.9% FLUSH
10.0000 mL | INTRAVENOUS | Status: DC | PRN
  Administered 2020-05-10: 10 mL
  Filled 2020-05-10: qty 10

## 2020-05-10 MED ORDER — HEPARIN SOD (PORK) LOCK FLUSH 100 UNIT/ML IV SOLN
500.0000 [IU] | Freq: Once | INTRAVENOUS | Status: AC | PRN
  Administered 2020-05-10: 500 [IU]
  Filled 2020-05-10: qty 5

## 2020-05-10 MED ORDER — PEGFILGRASTIM-CBQV 6 MG/0.6ML ~~LOC~~ SOSY
6.0000 mg | PREFILLED_SYRINGE | Freq: Once | SUBCUTANEOUS | Status: AC
  Administered 2020-05-10: 6 mg via SUBCUTANEOUS
  Filled 2020-05-10: qty 0.6

## 2020-05-10 MED ORDER — HEPARIN SOD (PORK) LOCK FLUSH 100 UNIT/ML IV SOLN
INTRAVENOUS | Status: AC
  Filled 2020-05-10: qty 5

## 2020-05-23 ENCOUNTER — Telehealth: Payer: Self-pay | Admitting: *Deleted

## 2020-05-23 NOTE — Telephone Encounter (Signed)
I had called pt  Yest. Evening. He needs help with getting papers completed so that he can send it in for cancer policy. Gave him my number to call me back

## 2020-05-28 NOTE — Progress Notes (Signed)
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 At that time he had progressive abdominal distention and no bowel movement for about one week. CT abdomen showed an apple core lesion in the descending/sigmoid colon.  2. Patient underwent Hartmann's procedure for obstructing sigmoid colon mass on 02/06/2017. Preoperative CEA was 5.0  3. 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 It is 0 out of 15 lymph nodes were positive for malignancy. Perineural and lymphovascular invasion was present. Margins were negative.pT3N0. MMR stable.  4. Given that he had high risk stage II colon cancer including he presented with obstruction and has LVI and perineural invasion- adjuvant xeloda chemotherapy was recommended for 6 months.   5.Patient had evidence of hand foot syndrome after cycle 3. Cycle 4 delayed by 1 week. Topical urea cream prescribed  6. Patient completed 8 cycles of adjuvant xeloda in October 2018. polps seen in sigmoid and decending colon but negative for malignancy  7. Repeat Ct abdomen after 8 cycles showed no metastatic disease. 6 mm lung nodule noted in RLL  8.Patient had a repeat colonoscopy in October 2018 which showed polyps in his transverse  and descending colon which were taken out and were negative for high-grade dysplasia or malignancy. Patient subsequently underwent colostomy takedown procedure by Dr. Burt Knack  9. Repeat CT chest abdomen/ pelvis showed increase in the size of previously seen lung nodules largest being 1.4 cm consistent with metastatic disease. PET/CT showed mild uptake in 2 lung nodules. Others were not hypermetabolic. No other evidence of metastatic disease  10. Repeat lung biopsy consistent with colon adenocarcinoma. Comprehensive RAS panel testing showed mutant KRAS  11.Given the slow rate of growth of his colon cancer and desire to maintain his quality of life plan was to start chemotherapy after he gets a 109-monthbreak in summer. Repeat CT chest abdomen and pelvis in August 2019 showed mild increase in the size of his lung nodules. No new lung nodules are no new sites of distant metastatic disease.FOLFOX Avastin chemotherapy started on 06/22/2018.Patient completed 12 cycles of FOLFOX Avastin chemotherapy with continued response to his lung lesions. Oxaliplatin was dropped and patient is currently on 5-FU Avastin.He did not have mild progression of her disease in lung nodules and was switched to second line FOLFIRI Mvasi  12. NGS showed no actionable mutations.  PD-L1 less than 1%.  No evidence of HER2 and NTRKmutations.  Interval history-feels like his neuropathy in his feet is gradually getting worse.  He is currently on gabapentin 900 mg twice daily.  Appetite and weight are otherwise stable and denies other complaints at this time  ECOG PS- 1 Pain scale- 0   Review of systems- Review of Systems  Constitutional: Negative for chills, fever, malaise/fatigue and weight loss.  HENT: Negative for congestion, ear discharge and nosebleeds.   Eyes: Negative for blurred vision.  Respiratory: Negative for cough,  hemoptysis, sputum production, shortness of breath and wheezing.   Cardiovascular:  Negative for chest pain, palpitations, orthopnea and claudication.  Gastrointestinal: Negative for abdominal pain, blood in stool, constipation, diarrhea, heartburn, melena, nausea and vomiting.  Genitourinary: Negative for dysuria, flank pain, frequency, hematuria and urgency.  Musculoskeletal: Negative for back pain, joint pain and myalgias.  Skin: Negative for rash.  Neurological: Positive for sensory change (Peripheral neuropathy). Negative for dizziness, tingling, focal weakness, seizures, weakness and headaches.  Endo/Heme/Allergies: Does not bruise/bleed easily.  Psychiatric/Behavioral: Negative for depression and suicidal ideas. The patient does not have insomnia.       No Known Allergies   Past Medical History:  Diagnosis Date  . Cancer ([place])    skin cancer-nose w graft,also shoulder- basal cell per pt  . Cataract    r eye  . Chronic kidney disease    kidney stones 20 y ago per pt  . Colon cancer ([place]) 2018   Surgical resection and chemo tx's.  . Diabetes mellitus without complication ([place])   . Eczema   . History of kidney stones   . Hyperlipidemia   . Hypertension   . Vertigo      Past Surgical History:  Procedure Laterality Date  . CATARACT EXTRACTION Left   . COLECTOMY WITH COLOSTOMY CREATION/HARTMANN PROCEDURE N/A 02/06/2017   Procedure: COLECTOMY WITH COLOSTOMY CREATION/HARTMANN PROCEDURE;  Surgeon: [name], MD;  Location: [facility] ORS;  Service: General;  Laterality: N/A;  . COLON SURGERY    . COLONOSCOPY WITH PROPOFOL N/A 09/04/2017   Procedure: COLONOSCOPY WITH PROPOFOL;  Surgeon: Jonathon Bellows, MD;  Location[facility] ENDOSCOPY;  Service: Gastroenterology;  Laterality: N/A;  . COLOSTOMY CLOSURE N/A 10/20/2017   Procedure: COLOSTOMY CLOSURE;  Surgeon: [name], MD;  Location: [facility] ORS;  Service: General;  Laterality: N/A;  . CYSTOSCOPY    . FLEXIBLE SIGMOIDOSCOPY N/A 02/06/2017   Procedure: FLEXIBLE SIGMOIDOSCOPY;  Surgeon: Christene Lye,  MD;  Location: [facility] ENDOSCOPY;  Service: Endoscopy;  Laterality: N/A;  . PORTACATH PLACEMENT N/A 06/17/2018   Procedure: INSERTION PORT-A-CATH, WITH FLUOROSCOPY;  Surgeon: [name], MD;  Location: [facility] ORS;  Service: General;  Laterality: N/A;    Social History   Socioeconomic History  . Marital status: Married    Spouse name: Not on file  . Number of children: Not on file  . Years of education: Not on file  . Highest education level: Not on file  Occupational History  . Not on file  Tobacco Use  . Smoking status: Never Smoker  . Smokeless tobacco: Never Used  Vaping Use  . Vaping Use: Never used  Substance and Sexual Activity  . Alcohol use: No  . Drug use: No  . Sexual activity: Not Currently  Other Topics Concern  . Not on file  Social History Narrative  . Not on file   Social Determinants of Health   Financial Resource Strain:   . Difficulty of Paying Living Expenses:   Food Insecurity:   . Worried About Charity fundraiser in the Last Year:   . Arboriculturist in the Last Year:   Transportation Needs:   . Film/video editor (Medical):   Marland Kitchen Lack of Transportation (Non-Medical):   Physical Activity:   . Days of Exercise per Week:   . Minutes of Exercise per Session:   Stress:   . Feeling of Stress :   Social Connections:   . Frequency of Communication with Friends and Family:   .  Frequency of Social Gatherings with Friends and Family:   . Attends Religious Services:   . Active Member of Clubs or Organizations:   . Attends Archivist Meetings:   Marland Kitchen Marital Status:   Intimate Partner Violence:   . Fear of Current or Ex-Partner:   . Emotionally Abused:   Marland Kitchen Physically Abused:   . Sexually Abused:     Family History  Problem Relation Age of Onset  . Diabetes Mother   . AAA (abdominal aortic aneurysm) Mother   . Coronary artery disease Mother   . Diabetes Father   . Coronary artery disease Father   . Dementia Father   . Breast cancer  Sister      Current Outpatient Medications:  .  acetaminophen (TYLENOL) 500 MG tablet, Take 500 mg by mouth every 6 (six) hours as needed (for pain.)., Disp: , Rfl:  .  aspirin EC 81 MG tablet, Take 81 mg by mouth daily., Disp: , Rfl:  .  Docusate Sodium (COLACE PO), Take 1 Dose by mouth as needed., Disp: , Rfl:  .  Dulaglutide 0.75 MG/0.5ML SOPN, Inject into the skin once a week. , Disp: , Rfl:  .  gabapentin (NEURONTIN) 300 MG capsule, TAKE 3 CAPSULES (900 MG TOTAL) BY MOUTH 2 (TWO) TIMES DAILY., Disp: 180 capsule, Rfl: 2 .  glimepiride (AMARYL) 2 MG tablet, Take 2 mg by mouth 2 (two) times daily before a meal. , Disp: , Rfl:  .  lidocaine-prilocaine (EMLA) cream, Place a small amount over cream over port 1 hour before each treatment, cover cream with saran wrap for clothing protection, Disp: 30 g, Rfl: 1 .  loratadine (CLARITIN) 10 MG tablet, Take 10 mg by mouth daily., Disp: , Rfl:  .  losartan (COZAAR) 25 MG tablet, Take 25 mg by mouth daily., Disp: , Rfl:  .  metFORMIN (GLUCOPHAGE) 1000 MG tablet, Take 1,000-1,500 tablets by mouth See admin instructions. TAKE 1 TABLET (1000 MG) BY MOUTH IN THE MORNING & TAKE 1.5 TABLETS (1500 MG) BY MOUTH AT SUPPER, Disp: , Rfl:  .  ondansetron (ZOFRAN) 8 MG tablet, Take 1 tablet (8 mg total) by mouth every 8 (eight) hours as needed for nausea or vomiting., Disp: 20 tablet, Rfl: 0 .  ONETOUCH VERIO test strip, CHECK FASTING BLOOD GLUCOSE ONCE DAILY, Disp: , Rfl:  .  pravastatin (PRAVACHOL) 20 MG tablet, Take 20 mg by mouth daily with supper. , Disp: , Rfl:  .  triamcinolone (KENALOG) 0.025 % cream, Apply 1 application topically daily as needed (FOR EZCEMA)., Disp: 30 g, Rfl: 2 No current facility-administered medications for this visit.  Facility-Administered Medications Ordered in Other Visits:  .  0.9 %  sodium chloride infusion, , Intravenous, Continuous, Sindy Guadeloupe, MD, Stopped at 07/27/18 1037 .  heparin lock flush 100 unit/mL, 500 Units,  Intravenous, Once, Sindy Guadeloupe, MD .  sodium chloride flush (NS) 0.9 % injection 10 mL, 10 mL, Intravenous, PRN, Sindy Guadeloupe, MD, 10 mL at 07/27/18 0920 .  sodium chloride flush (NS) 0.9 % injection 10 mL, 10 mL, Intravenous, Once, Sindy Guadeloupe, MD  Physical exam:  Vitals:   05/29/20 0834  BP: (!) 133/54  Pulse: 71  Resp: 18  Temp: (!) 97.3 F (36.3 C)  TempSrc: Tympanic  SpO2: 99%  Weight: 182 lb (82.6 kg)   Physical Exam HENT:     Head: Normocephalic and atraumatic.  Eyes:     Pupils: Pupils are equal, round, and reactive  to light.  Cardiovascular:     Rate and Rhythm: Normal rate and regular rhythm.     Heart sounds: Normal heart sounds.  Pulmonary:     Effort: Pulmonary effort is normal.     Breath sounds: Normal breath sounds.  Abdominal:     General: Bowel sounds are normal.     Palpations: Abdomen is soft.  Musculoskeletal:     Cervical back: Normal range of motion.  Skin:    General: Skin is warm and dry.  Neurological:     Mental Status: He is alert and oriented to person, place, and time.      CMP Latest Ref Rng & Units 05/29/2020  Glucose 70 - 99 mg/dL 277(H)  BUN 8 - 23 mg/dL 20  Creatinine 0.61 - 1.24 mg/dL 1.19  Sodium 135 - 145 mmol/L 137  Potassium 3.5 - 5.1 mmol/L 4.3  Chloride 98 - 111 mmol/L 106  CO2 22 - 32 mmol/L 22  Calcium 8.9 - 10.3 mg/dL 8.7(L)  Total Protein 6.5 - 8.1 g/dL 6.6  Total Bilirubin 0.3 - 1.2 mg/dL 0.6  Alkaline Phos 38 - 126 U/L 91  AST 15 - 41 U/L 37  ALT 0 - 44 U/L 30   CBC Latest Ref Rng & Units 05/29/2020  WBC 4.0 - 10.5 K/uL 7.3  Hemoglobin 13.0 - 17.0 g/dL 10.5(L)  Hematocrit 39 - 52 % 33.4(L)  Platelets 150 - 400 K/uL 212     Assessment and plan- Patient is a 79 y.o. male with stage IV colon cancer and lung metastases. He has had progression on FOLFOXZirabevchemotherapy. He is here for on treatment assessment prior to cycle 16 of FOLFIRI Zirabev chemotherapy  Counts okay to proceed with cycle 16 of  FOLFIRI Zirabev chemotherapy today.  Pump DC on day 3 and receives [place].  Port labs CBC with differential, CMP in 3 weeks in 6 weeks.  In 3 weeks he will see covering NP and I will see him back in 6 weeks.  Plan to repeat scans after 18 cycles.  Chemo-induced anemia: Currently stable around 10.  Continue to monitor if his anemia worsens we may have to consider reducing the dose of chemotherapy further  Chemo-induced peripheral neuropathy: I have asked him to add gabapentin third dose in the middle of the day 300 mg and gradually increase it to 900 mg.  He is otherwise taking 900 mg of gabapentin twice daily   Visit Diagnosis 1. Encounter for antineoplastic chemotherapy   2. Encounter for monoclonal antibody treatment for malignancy   3. Chemotherapy-induced peripheral neuropathy ([place])   4. 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[place])   5. Antineoplastic chemotherapy induced anemia      Dr. Randa Evens, MD, MPH [facility] [phone] 05/29/2020 9:04 AM

## 2020-05-29 ENCOUNTER — Other Ambulatory Visit: Payer: Self-pay

## 2020-05-29 ENCOUNTER — Inpatient Hospital Stay (HOSPITAL_BASED_OUTPATIENT_CLINIC_OR_DEPARTMENT_OTHER): Payer: Medicare Other | Admitting: Oncology

## 2020-05-29 ENCOUNTER — Encounter: Payer: Self-pay | Admitting: Oncology

## 2020-05-29 ENCOUNTER — Inpatient Hospital Stay: Payer: Medicare Other

## 2020-05-29 VITALS — BP 133/54 | HR 71 | Temp 97.3°F | Resp 18 | Wt 182.0 lb

## 2020-05-29 DIAGNOSIS — Z5112 Encounter for antineoplastic immunotherapy: Secondary | ICD-10-CM

## 2020-05-29 DIAGNOSIS — C189 Malignant neoplasm of colon, unspecified: Secondary | ICD-10-CM

## 2020-05-29 DIAGNOSIS — G620 Drug-induced polyneuropathy: Secondary | ICD-10-CM

## 2020-05-29 DIAGNOSIS — T451X5A Adverse effect of antineoplastic and immunosuppressive drugs, initial encounter: Secondary | ICD-10-CM

## 2020-05-29 DIAGNOSIS — Z5111 Encounter for antineoplastic chemotherapy: Secondary | ICD-10-CM | POA: Diagnosis not present

## 2020-05-29 DIAGNOSIS — D6481 Anemia due to antineoplastic chemotherapy: Secondary | ICD-10-CM

## 2020-05-29 DIAGNOSIS — C187 Malignant neoplasm of sigmoid colon: Secondary | ICD-10-CM

## 2020-05-29 DIAGNOSIS — C7800 Secondary malignant neoplasm of unspecified lung: Secondary | ICD-10-CM

## 2020-05-29 LAB — COMPREHENSIVE METABOLIC PANEL
ALT: 30 U/L (ref 0–44)
AST: 37 U/L (ref 15–41)
Albumin: 3.9 g/dL (ref 3.5–5.0)
Alkaline Phosphatase: 91 U/L (ref 38–126)
Anion gap: 9 (ref 5–15)
BUN: 20 mg/dL (ref 8–23)
CO2: 22 mmol/L (ref 22–32)
Calcium: 8.7 mg/dL — ABNORMAL LOW (ref 8.9–10.3)
Chloride: 106 mmol/L (ref 98–111)
Creatinine, Ser: 1.19 mg/dL (ref 0.61–1.24)
GFR calc Af Amer: >60 mL/min (ref >60)
GFR calc non Af Amer: 57 mL/min — ABNORMAL LOW (ref >60)
Glucose, Bld: 277 mg/dL — ABNORMAL HIGH (ref 70–99)
Potassium: 4.3 mmol/L (ref 3.5–5.1)
Sodium: 137 mmol/L (ref 135–145)
Total Bilirubin: 0.6 mg/dL (ref 0.3–1.2)
Total Protein: 6.6 g/dL (ref 6.5–8.1)

## 2020-05-29 LAB — CBC WITH DIFFERENTIAL/PLATELET
Abs Immature Granulocytes: 0.02 10*3/uL (ref 0.00–0.07)
Basophils Absolute: 0.1 10*3/uL (ref 0.0–0.1)
Basophils Relative: 1 %
Eosinophils Absolute: 0.2 10*3/uL (ref 0.0–0.5)
Eosinophils Relative: 3 %
HCT: 33.4 % — ABNORMAL LOW (ref 39.0–52.0)
Hemoglobin: 10.5 g/dL — ABNORMAL LOW (ref 13.0–17.0)
Immature Granulocytes: 0 %
Lymphocytes Relative: 27 %
Lymphs Abs: 1.9 10*3/uL (ref 0.7–4.0)
MCH: 28.7 pg (ref 26.0–34.0)
MCHC: 31.4 g/dL (ref 30.0–36.0)
MCV: 91.3 fL (ref 80.0–100.0)
Monocytes Absolute: 0.7 10*3/uL (ref 0.1–1.0)
Monocytes Relative: 9 %
Neutro Abs: 4.4 10*3/uL (ref 1.7–7.7)
Neutrophils Relative %: 60 %
Platelets: 212 10*3/uL (ref 150–400)
RBC: 3.66 MIL/uL — ABNORMAL LOW (ref 4.22–5.81)
RDW: 20 % — ABNORMAL HIGH (ref 11.5–15.5)
WBC: 7.3 10*3/uL (ref 4.0–10.5)
nRBC: 0 % (ref 0.0–0.2)

## 2020-05-29 LAB — PROTEIN, URINE, RANDOM: Total Protein, Urine: 22 mg/dL

## 2020-05-29 MED ORDER — LEUCOVORIN CALCIUM INJECTION 350 MG
402.0000 mg/m2 | Freq: Once | INTRAVENOUS | Status: AC
  Administered 2020-05-29: 800 mg via INTRAVENOUS
  Filled 2020-05-29: qty 25

## 2020-05-29 MED ORDER — PALONOSETRON HCL INJECTION 0.25 MG/5ML
0.2500 mg | Freq: Once | INTRAVENOUS | Status: AC
  Administered 2020-05-29: 0.25 mg via INTRAVENOUS
  Filled 2020-05-29: qty 5

## 2020-05-29 MED ORDER — SODIUM CHLORIDE 0.9 % IV SOLN
5.0000 mg/kg | Freq: Once | INTRAVENOUS | Status: AC
  Administered 2020-05-29: 400 mg via INTRAVENOUS
  Filled 2020-05-29: qty 16

## 2020-05-29 MED ORDER — SODIUM CHLORIDE 0.9 % IV SOLN
Freq: Once | INTRAVENOUS | Status: AC
  Filled 2020-05-29: qty 250

## 2020-05-29 MED ORDER — SODIUM CHLORIDE 0.9% FLUSH
10.0000 mL | INTRAVENOUS | Status: DC | PRN
  Administered 2020-05-29: 10 mL via INTRAVENOUS
  Filled 2020-05-29: qty 10

## 2020-05-29 MED ORDER — SODIUM CHLORIDE 0.9 % IV SOLN
2400.0000 mg/m2 | INTRAVENOUS | Status: DC
  Administered 2020-05-29: 4800 mg via INTRAVENOUS
  Filled 2020-05-29: qty 96

## 2020-05-29 MED ORDER — FLUOROURACIL CHEMO INJECTION 2.5 GM/50ML
400.0000 mg/m2 | Freq: Once | INTRAVENOUS | Status: AC
  Administered 2020-05-29: 800 mg via INTRAVENOUS
  Filled 2020-05-29: qty 16

## 2020-05-29 MED ORDER — SODIUM CHLORIDE 0.9 % IV SOLN
10.0000 mg | Freq: Once | INTRAVENOUS | Status: AC
  Administered 2020-05-29: 10 mg via INTRAVENOUS
  Filled 2020-05-29: qty 10

## 2020-05-29 MED ORDER — IRINOTECAN HCL CHEMO INJECTION 100 MG/5ML
150.0000 mg/m2 | Freq: Once | INTRAVENOUS | Status: AC
  Administered 2020-05-29: 300 mg via INTRAVENOUS
  Filled 2020-05-29: qty 15

## 2020-05-29 MED ORDER — ATROPINE SULFATE 1 MG/ML IJ SOLN
0.5000 mg | Freq: Once | INTRAMUSCULAR | Status: AC | PRN
  Administered 2020-05-29: 0.5 mg via INTRAVENOUS
  Filled 2020-05-29: qty 1

## 2020-05-29 MED ORDER — DEXAMETHASONE SODIUM PHOSPHATE 10 MG/ML IJ SOLN: 10.0000 mg | Freq: Once | INTRAMUSCULAR | Status: DC

## 2020-05-29 NOTE — Progress Notes (Signed)
Pt still having neuropathy. 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 His feet feel swollen and sore but no pain and when he sits he has to work out his knees from being so stiff

## 2020-05-30 LAB — CEA: CEA: 3.8 ng/mL (ref 0.0–4.7)

## 2020-05-31 ENCOUNTER — Telehealth: Payer: Self-pay | Admitting: *Deleted

## 2020-05-31 ENCOUNTER — Other Ambulatory Visit: Payer: Self-pay

## 2020-05-31 ENCOUNTER — Inpatient Hospital Stay: Payer: Medicare Other

## 2020-05-31 VITALS — BP 141/55 | HR 64 | Temp 97.4°F | Resp 18

## 2020-05-31 DIAGNOSIS — C189 Malignant neoplasm of colon, unspecified: Secondary | ICD-10-CM

## 2020-05-31 DIAGNOSIS — Z5111 Encounter for antineoplastic chemotherapy: Secondary | ICD-10-CM | POA: Diagnosis not present

## 2020-05-31 DIAGNOSIS — C7800 Secondary malignant neoplasm of unspecified lung: Secondary | ICD-10-CM

## 2020-05-31 MED ORDER — HEPARIN SOD (PORK) LOCK FLUSH 100 UNIT/ML IV SOLN
INTRAVENOUS | Status: AC
  Filled 2020-05-31: qty 5

## 2020-05-31 MED ORDER — SODIUM CHLORIDE 0.9% FLUSH
10.0000 mL | INTRAVENOUS | Status: DC | PRN
  Administered 2020-05-31: 10 mL
  Filled 2020-05-31: qty 10

## 2020-05-31 MED ORDER — HEPARIN SOD (PORK) LOCK FLUSH 100 UNIT/ML IV SOLN
500.0000 [IU] | Freq: Once | INTRAVENOUS | Status: AC | PRN
  Administered 2020-05-31: 500 [IU]
  Filled 2020-05-31: qty 5

## 2020-05-31 MED ORDER — PEGFILGRASTIM-CBQV 6 MG/0.6ML ~~LOC~~ SOSY
6.0000 mg | PREFILLED_SYRINGE | Freq: Once | SUBCUTANEOUS | Status: AC
  Administered 2020-05-31: 6 mg via SUBCUTANEOUS
  Filled 2020-05-31: qty 0.6

## 2020-05-31 NOTE — Telephone Encounter (Signed)
Pt was getting asst from connie who helped with cancer policy and when pt would get itemized bills she would send it to [place] life. We recently have change policy and doctor's nurse do that. We are no longer to get itemized bills. Pt. Has to call  Billing and then bring it to [place] and we can fax it for him. He gave me itemized billing from 12/20/19 through 03/27/2020 and it was faxed to [facility] at [phone]. Patient will get the papers back when he returns for his next visit for treatment

## 2020-06-04 ENCOUNTER — Other Ambulatory Visit: Payer: Self-pay | Admitting: *Deleted

## 2020-06-04 DIAGNOSIS — C7800 Secondary malignant neoplasm of unspecified lung: Secondary | ICD-10-CM

## 2020-06-04 DIAGNOSIS — C189 Malignant neoplasm of colon, unspecified: Secondary | ICD-10-CM

## 2020-06-19 ENCOUNTER — Inpatient Hospital Stay: Payer: Medicare Other | Attending: Oncology

## 2020-06-19 ENCOUNTER — Inpatient Hospital Stay (HOSPITAL_BASED_OUTPATIENT_CLINIC_OR_DEPARTMENT_OTHER): Payer: Medicare Other | Admitting: Oncology

## 2020-06-19 ENCOUNTER — Encounter: Payer: Self-pay | Admitting: Oncology

## 2020-06-19 ENCOUNTER — Inpatient Hospital Stay: Payer: Medicare Other

## 2020-06-19 ENCOUNTER — Other Ambulatory Visit: Payer: Self-pay

## 2020-06-19 VITALS — BP 150/58 | HR 71 | Temp 97.9°F | Resp 16 | Wt 184.8 lb

## 2020-06-19 DIAGNOSIS — C7800 Secondary malignant neoplasm of unspecified lung: Secondary | ICD-10-CM

## 2020-06-19 DIAGNOSIS — G620 Drug-induced polyneuropathy: Secondary | ICD-10-CM

## 2020-06-19 DIAGNOSIS — Z5111 Encounter for antineoplastic chemotherapy: Secondary | ICD-10-CM | POA: Diagnosis present

## 2020-06-19 DIAGNOSIS — C189 Malignant neoplasm of colon, unspecified: Secondary | ICD-10-CM

## 2020-06-19 DIAGNOSIS — C187 Malignant neoplasm of sigmoid colon: Secondary | ICD-10-CM | POA: Insufficient documentation

## 2020-06-19 DIAGNOSIS — T451X5A Adverse effect of antineoplastic and immunosuppressive drugs, initial encounter: Secondary | ICD-10-CM

## 2020-06-19 DIAGNOSIS — E860 Dehydration: Secondary | ICD-10-CM

## 2020-06-19 DIAGNOSIS — Z5189 Encounter for other specified aftercare: Secondary | ICD-10-CM | POA: Diagnosis not present

## 2020-06-19 DIAGNOSIS — Z5112 Encounter for antineoplastic immunotherapy: Secondary | ICD-10-CM

## 2020-06-19 LAB — COMPREHENSIVE METABOLIC PANEL
ALT: 39 U/L (ref 0–44)
AST: 46 U/L — ABNORMAL HIGH (ref 15–41)
Albumin: 3.9 g/dL (ref 3.5–5.0)
Alkaline Phosphatase: 103 U/L (ref 38–126)
Anion gap: 11 (ref 5–15)
BUN: 25 mg/dL — ABNORMAL HIGH (ref 8–23)
CO2: 21 mmol/L — ABNORMAL LOW (ref 22–32)
Calcium: 8.7 mg/dL — ABNORMAL LOW (ref 8.9–10.3)
Chloride: 104 mmol/L (ref 98–111)
Creatinine, Ser: 1.46 mg/dL — ABNORMAL HIGH (ref 0.61–1.24)
GFR calc Af Amer: 52 mL/min — ABNORMAL LOW (ref >60)
GFR calc non Af Amer: 45 mL/min — ABNORMAL LOW (ref >60)
Glucose, Bld: 220 mg/dL — ABNORMAL HIGH (ref 70–99)
Potassium: 4.8 mmol/L (ref 3.5–5.1)
Sodium: 136 mmol/L (ref 135–145)
Total Bilirubin: 0.5 mg/dL (ref 0.3–1.2)
Total Protein: 7 g/dL (ref 6.5–8.1)

## 2020-06-19 LAB — PROTEIN, URINE, RANDOM: Total Protein, Urine: 20 mg/dL

## 2020-06-19 LAB — CBC WITH DIFFERENTIAL/PLATELET
Abs Immature Granulocytes: 0.04 10*3/uL (ref 0.00–0.07)
Basophils Absolute: 0.1 10*3/uL (ref 0.0–0.1)
Basophils Relative: 1 %
Eosinophils Absolute: 0.3 10*3/uL (ref 0.0–0.5)
Eosinophils Relative: 4 %
HCT: 34.2 % — ABNORMAL LOW (ref 39.0–52.0)
Hemoglobin: 10.5 g/dL — ABNORMAL LOW (ref 13.0–17.0)
Immature Granulocytes: 1 %
Lymphocytes Relative: 26 %
Lymphs Abs: 2.1 10*3/uL (ref 0.7–4.0)
MCH: 28.6 pg (ref 26.0–34.0)
MCHC: 30.7 g/dL (ref 30.0–36.0)
MCV: 93.2 fL (ref 80.0–100.0)
Monocytes Absolute: 0.6 10*3/uL (ref 0.1–1.0)
Monocytes Relative: 8 %
Neutro Abs: 5 10*3/uL (ref 1.7–7.7)
Neutrophils Relative %: 60 %
Platelets: 182 10*3/uL (ref 150–400)
RBC: 3.67 MIL/uL — ABNORMAL LOW (ref 4.22–5.81)
RDW: 20.7 % — ABNORMAL HIGH (ref 11.5–15.5)
WBC: 8.1 10*3/uL (ref 4.0–10.5)
nRBC: 0 % (ref 0.0–0.2)

## 2020-06-19 MED ORDER — PALONOSETRON HCL INJECTION 0.25 MG/5ML
0.2500 mg | Freq: Once | INTRAVENOUS | Status: AC
  Administered 2020-06-19: 0.25 mg via INTRAVENOUS
  Filled 2020-06-19: qty 5

## 2020-06-19 MED ORDER — SODIUM CHLORIDE 0.9 % IV SOLN
2400.0000 mg/m2 | INTRAVENOUS | Status: DC
  Administered 2020-06-19: 4800 mg via INTRAVENOUS
  Filled 2020-06-19: qty 96

## 2020-06-19 MED ORDER — ATROPINE SULFATE 1 MG/ML IJ SOLN
0.5000 mg | Freq: Once | INTRAMUSCULAR | Status: AC | PRN
  Administered 2020-06-19: 0.5 mg via INTRAVENOUS
  Filled 2020-06-19: qty 1

## 2020-06-19 MED ORDER — SODIUM CHLORIDE 0.9 % IV SOLN
Freq: Once | INTRAVENOUS | Status: AC
  Filled 2020-06-19: qty 250

## 2020-06-19 MED ORDER — DEXAMETHASONE SODIUM PHOSPHATE 10 MG/ML IJ SOLN
10.0000 mg | Freq: Once | INTRAMUSCULAR | Status: AC
  Administered 2020-06-19: 10 mg via INTRAVENOUS
  Filled 2020-06-19: qty 1

## 2020-06-19 MED ORDER — SODIUM CHLORIDE 0.9 % IV SOLN
INTRAVENOUS | Status: AC
  Filled 2020-06-19 (×2): qty 250

## 2020-06-19 MED ORDER — SODIUM CHLORIDE 0.9% FLUSH
10.0000 mL | Freq: Once | INTRAVENOUS | Status: AC
  Administered 2020-06-19: 10 mL via INTRAVENOUS
  Filled 2020-06-19: qty 10

## 2020-06-19 MED ORDER — IRINOTECAN HCL CHEMO INJECTION 100 MG/5ML
150.0000 mg/m2 | Freq: Once | INTRAVENOUS | Status: AC
  Administered 2020-06-19: 300 mg via INTRAVENOUS
  Filled 2020-06-19: qty 15

## 2020-06-19 MED ORDER — SODIUM CHLORIDE 0.9 % IV SOLN
5.0000 mg/kg | Freq: Once | INTRAVENOUS | Status: AC
  Administered 2020-06-19: 400 mg via INTRAVENOUS
  Filled 2020-06-19: qty 16

## 2020-06-19 MED ORDER — FLUOROURACIL CHEMO INJECTION 2.5 GM/50ML
400.0000 mg/m2 | Freq: Once | INTRAVENOUS | Status: AC
  Administered 2020-06-19: 800 mg via INTRAVENOUS
  Filled 2020-06-19: qty 16

## 2020-06-19 MED ORDER — LEUCOVORIN CALCIUM INJECTION 350 MG
402.0000 mg/m2 | Freq: Once | INTRAVENOUS | Status: AC
  Administered 2020-06-19: 800 mg via INTRAVENOUS
  Filled 2020-06-19: qty 40

## 2020-06-19 NOTE — Progress Notes (Signed)
Pt states that he is doing good, he eats to well, he has not bowel issues. No pain. Cont. On chemo regimen

## 2020-06-19 NOTE — Progress Notes (Signed)
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 At that time he had progressive abdominal distention and no bowel movement for about one week. CT abdomen showed an apple core lesion in the descending/sigmoid colon.  2. Patient underwent Hartmann's procedure for obstructing sigmoid colon mass on 02/06/2017. Preoperative CEA was 5.0  3. 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 It is 0 out of 15 lymph nodes were positive for malignancy. Perineural and lymphovascular invasion was present. Margins were negative.pT3N0. MMR stable.  4. Given that he had high risk stage II colon cancer including he presented with obstruction and has LVI and perineural invasion- adjuvant xeloda chemotherapy was recommended for 6 months.   5.Patient had evidence of hand foot syndrome after cycle 3. Cycle 4 delayed by 1 week. Topical urea cream prescribed  6. Patient completed 8 cycles of adjuvant xeloda in October 2018. polps seen in sigmoid and decending colon but negative for malignancy  7. Repeat Ct abdomen after 8 cycles showed no metastatic disease. 6 mm lung nodule noted in RLL  8.Patient had a repeat colonoscopy in October 2018 which showed polyps in his transverse  and descending colon which were taken out and were negative for high-grade dysplasia or malignancy. Patient subsequently underwent colostomy takedown procedure by Dr. Burt Knack  9. Repeat CT chest abdomen/ pelvis showed increase in the size of previously seen lung nodules largest being 1.4 cm consistent with metastatic disease. PET/CT showed mild uptake in 2 lung nodules. Others were not hypermetabolic. No other evidence of metastatic disease  10. Repeat lung biopsy consistent with colon adenocarcinoma. Comprehensive RAS panel testing showed mutant KRAS  11.Given the slow rate of growth of his colon cancer and desire to maintain his quality of life plan was to start chemotherapy after he gets a 70-monthbreak in summer. Repeat CT chest abdomen and pelvis in August 2019 showed mild increase in the size of his lung nodules. No new lung nodules are no new sites of distant metastatic disease.FOLFOX Avastin chemotherapy started on 06/22/2018.Patient completed 12 cycles of FOLFOX Avastin chemotherapy with continued response to his lung lesions. Oxaliplatin was dropped and patient is currently on 5-FU Avastin.He did not have mild progression of her disease in lung nodules and was switched to second line FOLFIRI Mvasi  12. NGS showed no actionable mutations.  PD-L1 less than 1%.  No evidence of HER2 and NTRKmutations.  Interval history-feels like his neuropathy in his feet is stable with increase of gabapentin. Feels tired and has trouble staying awake in the afternoons.  Has to set an alarm to pick up his granddaughter from school.  Appetite and weight are stable.   ECOG PS- 1 Pain scale- 0   Review of systems- Review of Systems  Constitutional: Negative.  Negative for chills, fever, malaise/fatigue and weight loss.  HENT: Negative for congestion, ear pain and tinnitus.  Eyes: Negative.  Negative for blurred vision and double vision.  Respiratory: Negative.  Negative for cough, sputum  production and shortness of breath.   Cardiovascular: Negative.  Negative for chest pain, palpitations and leg swelling.  Gastrointestinal: Negative.  Negative for abdominal pain, constipation, diarrhea, nausea and vomiting.  Genitourinary: Negative for dysuria, frequency and urgency.  Musculoskeletal: Positive for joint pain. Negative for back pain and falls.  Skin: Negative.  Negative for rash.  Neurological: Positive for sensory change. Negative for weakness and headaches.  Endo/Heme/Allergies: Negative.  Does not bruise/bleed easily.  Psychiatric/Behavioral: Negative.  Negative for depression. The patient is not nervous/anxious and does not have insomnia.       No Known Allergies   Past Medical History:  Diagnosis Date  . Cancer ([place])    skin cancer-nose w graft,also shoulder- basal cell per pt  . Cataract    r eye  . Chronic kidney disease    kidney stones 20 y ago per pt  . Colon cancer ([place]) 2018   Surgical resection and chemo tx's.  . Diabetes mellitus without complication ([place])   . Eczema   . History of kidney stones   . Hyperlipidemia   . Hypertension   . Vertigo      Past Surgical History:  Procedure Laterality Date  . CATARACT EXTRACTION Left   . COLECTOMY WITH COLOSTOMY CREATION/HARTMANN PROCEDURE N/A 02/06/2017   Procedure: COLECTOMY WITH COLOSTOMY CREATION/HARTMANN PROCEDURE;  Surgeon: [name], MD;  Location: [facility] ORS;  Service: General;  Laterality: N/A;  . COLON SURGERY    . COLONOSCOPY WITH PROPOFOL N/A 09/04/2017   Procedure: COLONOSCOPY WITH PROPOFOL;  Surgeon: Jonathon Bellows, MD;  Location: [facility] ENDOSCOPY;  Service: Gastroenterology;  Laterality: N/A;  . COLOSTOMY CLOSURE N/A 10/20/2017   Procedure: COLOSTOMY CLOSURE;  Surgeon: [name], MD;  Location: [facility] ORS;  Service: General;  Laterality: N/A;  . CYSTOSCOPY    . FLEXIBLE SIGMOIDOSCOPY N/A 02/06/2017   Procedure: FLEXIBLE SIGMOIDOSCOPY;  Surgeon: Christene Lye, MD;  Location:  [facility] ENDOSCOPY;  Service: Endoscopy;  Laterality: N/A;  . PORTACATH PLACEMENT N/A 06/17/2018   Procedure: INSERTION PORT-A-CATH, WITH FLUOROSCOPY;  Surgeon: [name], MD;  Location: [facility] ORS;  Service: General;  Laterality: N/A;    Social History   Socioeconomic History  . Marital status: Married    Spouse name: Not on file  . Number of children: Not on file  . Years of education: Not on file  . Highest education level: Not on file  Occupational History  . Not on file  Tobacco Use  . Smoking status: Never Smoker  . Smokeless tobacco: Never Used  Vaping Use  . Vaping Use: Never used  Substance and Sexual Activity  . Alcohol use: No  . Drug use: No  . Sexual activity: Not Currently  Other Topics Concern  . Not on file  Social History Narrative  . Not on file   Social Determinants of Health   Financial Resource Strain:   . Difficulty of Paying Living Expenses:   Food Insecurity:   . Worried About Charity fundraiser in the Last Year:   . Arboriculturist in the Last Year:   Transportation Needs:   . Film/video editor (Medical):   Marland Kitchen Lack of Transportation (Non-Medical):   Physical Activity:   . Days of Exercise per Week:   . Minutes of Exercise per Session:   Stress:   . Feeling of Stress :  Social Connections:   . Frequency of Communication with Friends and Family:   . Frequency of Social Gatherings with Friends and Family:   . Attends Religious Services:   . Active Member of Clubs or Organizations:   . Attends Archivist Meetings:   Marland Kitchen Marital Status:   Intimate Partner Violence:   . Fear of Current or Ex-Partner:   . Emotionally Abused:   Marland Kitchen Physically Abused:   . Sexually Abused:     Family History  Problem Relation Age of Onset  . Diabetes Mother   . AAA (abdominal aortic aneurysm) Mother   . Coronary artery disease Mother   . Diabetes Father   . Coronary artery disease Father   . Dementia Father   . Breast cancer Sister       Current Outpatient Medications:  .  acetaminophen (TYLENOL) 500 MG tablet, Take 500 mg by mouth every 6 (six) hours as needed (for pain.)., Disp: , Rfl:  .  aspirin EC 81 MG tablet, Take 81 mg by mouth daily., Disp: , Rfl:  .  Docusate Sodium (COLACE PO), Take 1 Dose by mouth as needed., Disp: , Rfl:  .  Dulaglutide 0.75 MG/0.5ML SOPN, Inject 1.5 mg into the skin once a week. , Disp: , Rfl:  .  gabapentin (NEURONTIN) 300 MG capsule, TAKE 3 CAPSULES (900 MG TOTAL) BY MOUTH 2 (TWO) TIMES DAILY., Disp: 180 capsule, Rfl: 2 .  glimepiride (AMARYL) 2 MG tablet, Take 2 mg by mouth 2 (two) times daily before a meal. , Disp: , Rfl:  .  loratadine (CLARITIN) 10 MG tablet, Take 10 mg by mouth daily., Disp: , Rfl:  .  losartan (COZAAR) 25 MG tablet, Take 25 mg by mouth daily., Disp: , Rfl:  .  metFORMIN (GLUCOPHAGE) 1000 MG tablet, Take 1,000-1,500 tablets by mouth See admin instructions. TAKE 1 TABLET (1000 MG) BY MOUTH IN THE MORNING & TAKE 1.5 TABLETS (1500 MG) BY MOUTH AT SUPPER, Disp: , Rfl:  .  ONETOUCH VERIO test strip, CHECK FASTING BLOOD GLUCOSE ONCE DAILY, Disp: , Rfl:  .  pravastatin (PRAVACHOL) 20 MG tablet, Take 20 mg by mouth daily with supper. , Disp: , Rfl:  .  triamcinolone (KENALOG) 0.025 % cream, Apply 1 application topically daily as needed (FOR EZCEMA)., Disp: 30 g, Rfl: 2 .  lidocaine-prilocaine (EMLA) cream, Place a small amount over cream over port 1 hour before each treatment, cover cream with saran wrap for clothing protection (Patient not taking: Reported on 06/19/2020), Disp: 30 g, Rfl: 1 .  ondansetron (ZOFRAN) 8 MG tablet, Take 1 tablet (8 mg total) by mouth every 8 (eight) hours as needed for nausea or vomiting. (Patient not taking: Reported on 06/19/2020), Disp: 20 tablet, Rfl: 0 No current facility-administered medications for this visit.  Facility-Administered Medications Ordered in Other Visits:  .  0.9 %  sodium chloride infusion, , Intravenous, Continuous, Sindy Guadeloupe, MD, Stopped at 07/27/18 1037 .  0.9 %  sodium chloride infusion, , Intravenous, Continuous, Carlyon Nolasco E, NP .  heparin lock flush 100 unit/mL, 500 Units, Intravenous, Once, Randa Evens C, MD .  sodium chloride flush (NS) 0.9 % injection 10 mL, 10 mL, Intravenous, PRN, Sindy Guadeloupe, MD, 10 mL at 07/27/18 0920 .  sodium chloride flush (NS) 0.9 % injection 10 mL, 10 mL, Intravenous, Once, Sindy Guadeloupe, MD  Physical exam:  Vitals:   06/19/20 0902  BP: (!) 150/58  Pulse: 71  Resp: 16  Temp: 97.9 F (36.6 C)  TempSrc: Tympanic  Weight: 184 lb 12.8 oz (83.8 kg)   Physical Exam HENT:     Head: Normocephalic and atraumatic.  Eyes:     Pupils: Pupils are equal, round, and reactive to light.  Cardiovascular:     Rate and Rhythm: Normal rate and regular rhythm.     Heart sounds: Normal heart sounds.  Pulmonary:     Effort: Pulmonary effort is normal.     Breath sounds: Normal breath sounds.  Abdominal:     General: Bowel sounds are normal.     Palpations: Abdomen is soft.  Musculoskeletal:        General: Tenderness (Both knees) present.     Cervical back: Normal range of motion.  Skin:    General: Skin is warm and dry.  Neurological:     Mental Status: He is alert and oriented to person, place, and time.      CMP Latest Ref Rng & Units 06/19/2020  Glucose 70 - 99 mg/dL 220(H)  BUN 8 - 23 mg/dL 25(H)  Creatinine 0.61 - 1.24 mg/dL 1.46(H)  Sodium 135 - 145 mmol/L 136  Potassium 3.5 - 5.1 mmol/L 4.8  Chloride 98 - 111 mmol/L 104  CO2 22 - 32 mmol/L 21(L)  Calcium 8.9 - 10.3 mg/dL 8.7(L)  Total Protein 6.5 - 8.1 g/dL 7.0  Total Bilirubin 0.3 - 1.2 mg/dL 0.5  Alkaline Phos 38 - 126 U/L 103  AST 15 - 41 U/L 46(H)  ALT 0 - 44 U/L 39   CBC Latest Ref Rng & Units 06/19/2020  WBC 4.0 - 10.5 K/uL 8.1  Hemoglobin 13.0 - 17.0 g/dL 10.5(L)  Hematocrit 39 - 52 % 34.2(L)  Platelets 150 - 400 K/uL 182     Assessment and plan- Patient is a 79 y.o. male with  stage IV colon cancer and lung metastases. He has had progression on FOLFOXZirabevchemotherapy. He is here for on treatment assessment prior to cycle 17 of FOLFIRI Zirabev chemotherapy  Counts okay to proceed with cycle 17 of FOLFIRI Zirabev chemotherapy today.  Pump DC on day 3 and receives [place].  Port labs CBC with differential, CMP in 3 weeks. Plan to repeat scans after 18 cycles.  Chemo-induced anemia: Currently stable around 10.   Chemo-induced peripheral neuropathy: He is currently taking 900 mg 3 times a day and has recently added an additional 300 mg tablet at bedtime.  Although, this appears to be helping his peripheral neuropathy he is sleeping most of the afternoon.  We will touch base with Dr. [name] to see if we should switch him to a different medication such as Cymbalta or Lyrica.  Will call patient with her recommendations.  Elevated creatinine: Have asked he increase his fluid intake and he will receive an additional 500 mL of IV fluid prior to his infusion today.   Visit Diagnosis 1. Malignant neoplasm metastatic to lung, unspecified laterality (HCC)   2. Dehydration   3. Chemotherapy-induced peripheral neuropathy ([place])      Rulon Abide, AGNP-C [facility] [phone] 06/19/2020 9:55 AM

## 2020-06-21 ENCOUNTER — Inpatient Hospital Stay: Payer: Medicare Other

## 2020-06-21 ENCOUNTER — Other Ambulatory Visit: Payer: Self-pay

## 2020-06-21 VITALS — BP 136/67 | HR 74 | Temp 98.0°F | Resp 18

## 2020-06-21 DIAGNOSIS — C7800 Secondary malignant neoplasm of unspecified lung: Secondary | ICD-10-CM

## 2020-06-21 DIAGNOSIS — Z5112 Encounter for antineoplastic immunotherapy: Secondary | ICD-10-CM | POA: Diagnosis not present

## 2020-06-21 DIAGNOSIS — C189 Malignant neoplasm of colon, unspecified: Secondary | ICD-10-CM

## 2020-06-21 MED ORDER — HEPARIN SOD (PORK) LOCK FLUSH 100 UNIT/ML IV SOLN
INTRAVENOUS | Status: AC
  Filled 2020-06-21: qty 5

## 2020-06-21 MED ORDER — SODIUM CHLORIDE 0.9% FLUSH
10.0000 mL | INTRAVENOUS | Status: DC | PRN
  Administered 2020-06-21: 10 mL
  Filled 2020-06-21: qty 10

## 2020-06-21 MED ORDER — HEPARIN SOD (PORK) LOCK FLUSH 100 UNIT/ML IV SOLN
500.0000 [IU] | Freq: Once | INTRAVENOUS | Status: AC | PRN
  Administered 2020-06-21: 500 [IU]
  Filled 2020-06-21: qty 5

## 2020-06-21 MED ORDER — PEGFILGRASTIM-CBQV 6 MG/0.6ML ~~LOC~~ SOSY
6.0000 mg | PREFILLED_SYRINGE | Freq: Once | SUBCUTANEOUS | Status: AC
  Administered 2020-06-21: 6 mg via SUBCUTANEOUS
  Filled 2020-06-21: qty 0.6

## 2020-07-03 ENCOUNTER — Encounter: Payer: Self-pay | Admitting: Oncology

## 2020-07-03 ENCOUNTER — Other Ambulatory Visit: Payer: Self-pay

## 2020-07-03 ENCOUNTER — Inpatient Hospital Stay: Payer: Medicare Other

## 2020-07-03 ENCOUNTER — Inpatient Hospital Stay (HOSPITAL_BASED_OUTPATIENT_CLINIC_OR_DEPARTMENT_OTHER): Payer: Medicare Other | Admitting: Oncology

## 2020-07-03 VITALS — BP 143/58 | HR 73 | Temp 98.2°F | Resp 16 | Ht 66.0 in | Wt 184.6 lb

## 2020-07-03 DIAGNOSIS — Z5111 Encounter for antineoplastic chemotherapy: Secondary | ICD-10-CM

## 2020-07-03 DIAGNOSIS — C189 Malignant neoplasm of colon, unspecified: Secondary | ICD-10-CM | POA: Diagnosis not present

## 2020-07-03 DIAGNOSIS — D6481 Anemia due to antineoplastic chemotherapy: Secondary | ICD-10-CM | POA: Diagnosis not present

## 2020-07-03 DIAGNOSIS — T451X5A Adverse effect of antineoplastic and immunosuppressive drugs, initial encounter: Secondary | ICD-10-CM

## 2020-07-03 DIAGNOSIS — C7800 Secondary malignant neoplasm of unspecified lung: Secondary | ICD-10-CM | POA: Diagnosis not present

## 2020-07-03 DIAGNOSIS — Z5112 Encounter for antineoplastic immunotherapy: Secondary | ICD-10-CM

## 2020-07-03 DIAGNOSIS — G620 Drug-induced polyneuropathy: Secondary | ICD-10-CM

## 2020-07-03 LAB — CBC WITH DIFFERENTIAL/PLATELET
Abs Immature Granulocytes: 0.1 10*3/uL — ABNORMAL HIGH (ref 0.00–0.07)
Basophils Absolute: 0.1 10*3/uL (ref 0.0–0.1)
Basophils Relative: 1 %
Eosinophils Absolute: 0.3 10*3/uL (ref 0.0–0.5)
Eosinophils Relative: 4 %
HCT: 32.1 % — ABNORMAL LOW (ref 39.0–52.0)
Hemoglobin: 10.1 g/dL — ABNORMAL LOW (ref 13.0–17.0)
Immature Granulocytes: 1 %
Lymphocytes Relative: 28 %
Lymphs Abs: 2.2 10*3/uL (ref 0.7–4.0)
MCH: 29 pg (ref 26.0–34.0)
MCHC: 31.5 g/dL (ref 30.0–36.0)
MCV: 92.2 fL (ref 80.0–100.0)
Monocytes Absolute: 0.5 10*3/uL (ref 0.1–1.0)
Monocytes Relative: 6 %
Neutro Abs: 4.6 10*3/uL (ref 1.7–7.7)
Neutrophils Relative %: 60 %
Platelets: 158 10*3/uL (ref 150–400)
RBC: 3.48 MIL/uL — ABNORMAL LOW (ref 4.22–5.81)
RDW: 19.9 % — ABNORMAL HIGH (ref 11.5–15.5)
WBC: 7.7 10*3/uL (ref 4.0–10.5)
nRBC: 0 % (ref 0.0–0.2)

## 2020-07-03 LAB — COMPREHENSIVE METABOLIC PANEL
ALT: 28 U/L (ref 0–44)
AST: 39 U/L (ref 15–41)
Albumin: 3.8 g/dL (ref 3.5–5.0)
Alkaline Phosphatase: 92 U/L (ref 38–126)
Anion gap: 9 (ref 5–15)
BUN: 13 mg/dL (ref 8–23)
CO2: 22 mmol/L (ref 22–32)
Calcium: 8.5 mg/dL — ABNORMAL LOW (ref 8.9–10.3)
Chloride: 106 mmol/L (ref 98–111)
Creatinine, Ser: 1.08 mg/dL (ref 0.61–1.24)
GFR calc Af Amer: >60 mL/min (ref >60)
GFR calc non Af Amer: >60 mL/min (ref >60)
Glucose, Bld: 209 mg/dL — ABNORMAL HIGH (ref 70–99)
Potassium: 4.2 mmol/L (ref 3.5–5.1)
Sodium: 137 mmol/L (ref 135–145)
Total Bilirubin: 0.5 mg/dL (ref 0.3–1.2)
Total Protein: 6.5 g/dL (ref 6.5–8.1)

## 2020-07-03 LAB — PROTEIN, URINE, RANDOM: Total Protein, Urine: <6 mg/dL

## 2020-07-03 MED ORDER — LEUCOVORIN CALCIUM INJECTION 350 MG
402.0000 mg/m2 | Freq: Once | INTRAVENOUS | Status: AC
  Administered 2020-07-03: 800 mg via INTRAVENOUS
  Filled 2020-07-03: qty 17.5

## 2020-07-03 MED ORDER — PALONOSETRON HCL INJECTION 0.25 MG/5ML
0.2500 mg | Freq: Once | INTRAVENOUS | Status: AC
  Administered 2020-07-03: 0.25 mg via INTRAVENOUS
  Filled 2020-07-03: qty 5

## 2020-07-03 MED ORDER — SODIUM CHLORIDE 0.9 % IV SOLN
Freq: Once | INTRAVENOUS | Status: AC
  Filled 2020-07-03: qty 250

## 2020-07-03 MED ORDER — IRINOTECAN HCL CHEMO INJECTION 100 MG/5ML
150.0000 mg/m2 | Freq: Once | INTRAVENOUS | Status: AC
  Administered 2020-07-03: 300 mg via INTRAVENOUS
  Filled 2020-07-03: qty 15

## 2020-07-03 MED ORDER — DEXAMETHASONE SODIUM PHOSPHATE 10 MG/ML IJ SOLN
10.0000 mg | Freq: Once | INTRAMUSCULAR | Status: AC
  Administered 2020-07-03: 10 mg via INTRAVENOUS
  Filled 2020-07-03: qty 1

## 2020-07-03 MED ORDER — PREGABALIN 75 MG PO CAPS: 75.0000 mg | ORAL_CAPSULE | Freq: Two times a day (BID) | ORAL | 2 refills | Status: DC

## 2020-07-03 MED ORDER — SODIUM CHLORIDE 0.9 % IV SOLN
5.0000 mg/kg | Freq: Once | INTRAVENOUS | Status: AC
  Administered 2020-07-03: 400 mg via INTRAVENOUS
  Filled 2020-07-03: qty 16

## 2020-07-03 MED ORDER — SODIUM CHLORIDE 0.9 % IV SOLN
2400.0000 mg/m2 | INTRAVENOUS | Status: DC
  Administered 2020-07-03: 4800 mg via INTRAVENOUS
  Filled 2020-07-03: qty 96

## 2020-07-03 MED ORDER — SODIUM CHLORIDE 0.9% FLUSH
10.0000 mL | Freq: Once | INTRAVENOUS | Status: AC
  Administered 2020-07-03: 10 mL via INTRAVENOUS
  Filled 2020-07-03: qty 10

## 2020-07-03 MED ORDER — ATROPINE SULFATE 1 MG/ML IJ SOLN
0.5000 mg | Freq: Once | INTRAMUSCULAR | Status: AC | PRN
  Administered 2020-07-03: 0.5 mg via INTRAVENOUS
  Filled 2020-07-03: qty 1

## 2020-07-03 MED ORDER — FLUOROURACIL CHEMO INJECTION 2.5 GM/50ML
400.0000 mg/m2 | Freq: Once | INTRAVENOUS | Status: AC
  Administered 2020-07-03: 800 mg via INTRAVENOUS
  Filled 2020-07-03: qty 16

## 2020-07-03 NOTE — Progress Notes (Signed)
Pt says that gabapentin makes him sleepy. He wants to try another drug that will not cause drowsiness. Pt eating good and drinking good. BM's good.

## 2020-07-03 NOTE — Progress Notes (Signed)
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 At that time he had progressive abdominal distention and no bowel movement for about one week. CT abdomen showed an apple core lesion in the descending/sigmoid colon.  2. Patient underwent Hartmann's procedure for obstructing sigmoid colon mass on 02/06/2017. Preoperative CEA was 5.0  3. 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 It is 0 out of 15 lymph nodes were positive for malignancy. Perineural and lymphovascular invasion was present. Margins were negative.pT3N0. MMR stable.  4. Given that he had high risk stage II colon cancer including he presented with obstruction and has LVI and perineural invasion- adjuvant xeloda chemotherapy was recommended for 6 months.   5.Patient had evidence of hand foot syndrome after cycle 3. Cycle 4 delayed by 1 week. Topical urea cream prescribed  6. Patient completed 8 cycles of adjuvant xeloda in October 2018. polps seen in sigmoid and decending colon but negative for malignancy  7. Repeat Ct abdomen after 8 cycles showed no metastatic disease. 6 mm lung nodule noted in RLL  8.Patient had a repeat colonoscopy in October 2018 which showed polyps in his transverse  and descending colon which were taken out and were negative for high-grade dysplasia or malignancy. Patient subsequently underwent colostomy takedown procedure by Dr. Burt Knack  9. Repeat CT chest abdomen/ pelvis showed increase in the size of previously seen lung nodules largest being 1.4 cm consistent with metastatic disease. PET/CT showed mild uptake in 2 lung nodules. Others were not hypermetabolic. No other evidence of metastatic disease  10. Repeat lung biopsy consistent with colon adenocarcinoma. Comprehensive RAS panel testing showed mutant KRAS  11.Given the slow rate of growth of his colon cancer and desire to maintain his quality of life plan was to start chemotherapy after he gets a 67-monthbreak in summer. Repeat CT chest abdomen and pelvis in August 2019 showed mild increase in the size of his lung nodules. No new lung nodules are no new sites of distant metastatic disease.FOLFOX Avastin chemotherapy started on 06/22/2018.Patient completed 12 cycles of FOLFOX Avastin chemotherapy with continued response to his lung lesions. Oxaliplatin was dropped and patient is currently on 5-FU Avastin.He did not have mild progression of her disease in lung nodules and was switched to second line FOLFIRI Mvasi  12. NGSshowed no actionable mutations. PD-L1 less than 1%.No evidence of HER2 and NTRKmutations.   Interval history-neuropathy is stable with gabapentin but patient reports increased somnolence after increasing the dose.  Denies other complaints ECOG PS- 1 Pain scale- 0   Review of systems- Review of Systems  Constitutional: Negative for chills, fever, malaise/fatigue and weight loss.  HENT: Negative for congestion, ear discharge and nosebleeds.   Eyes: Negative for blurred vision.  Respiratory: Negative for cough, hemoptysis, sputum production, shortness of breath and wheezing.   Cardiovascular: Negative for chest pain, palpitations, orthopnea and claudication.  Gastrointestinal: Negative for abdominal pain, blood in stool, constipation, diarrhea, heartburn, melena, nausea and vomiting.  Genitourinary: Negative for dysuria, flank pain, frequency, hematuria and urgency.  Musculoskeletal: Negative for back pain, joint pain and myalgias.  Skin: Negative for rash.  Neurological: Negative for dizziness, tingling, focal weakness, seizures, weakness and headaches.  Endo/Heme/Allergies: Does not bruise/bleed easily.  Psychiatric/Behavioral: Negative for depression and suicidal ideas. The patient does not have insomnia.       No Known Allergies   Past Medical History:  Diagnosis Date  . Cancer ([place])    skin cancer-nose w graft,also shoulder- basal cell per pt  . Cataract    r eye  . Chronic kidney disease    kidney stones 20 y ago per pt  . Colon cancer ([place]) 2018   Surgical resection and chemo tx's.  . Diabetes mellitus without complication ([place])   . Eczema   . History of kidney stones   . Hyperlipidemia   . Hypertension   . Vertigo      Past Surgical History:  Procedure Laterality Date  . CATARACT EXTRACTION Left   . COLECTOMY WITH COLOSTOMY CREATION/HARTMANN PROCEDURE N/A 02/06/2017   Procedure: COLECTOMY WITH COLOSTOMY CREATION/HARTMANN PROCEDURE;  Surgeon: [name], MD;  Location: [facility] ORS;  Service: General;  Laterality: N/A;  . COLON SURGERY    . COLONOSCOPY WITH PROPOFOL N/A 09/04/2017   Procedure: COLONOSCOPY WITH PROPOFOL;  Surgeon: Jonathon Bellows, MD;  Location: [facility] ENDOSCOPY;  Service: Gastroenterology;  Laterality: N/A;  . COLOSTOMY CLOSURE N/A 10/20/2017   Procedure: COLOSTOMY CLOSURE;  Surgeon: [name], MD;  Location: [facility] ORS;  Service: General;  Laterality: N/A;  . CYSTOSCOPY    . FLEXIBLE SIGMOIDOSCOPY N/A 02/06/2017   Procedure: FLEXIBLE SIGMOIDOSCOPY;  Surgeon: Christene Lye, MD;  Location: [facility] ENDOSCOPY;  Service: Endoscopy;  Laterality: N/A;  . PORTACATH PLACEMENT N/A 06/17/2018   Procedure:  INSERTION PORT-A-CATH, WITH FLUOROSCOPY;  Surgeon: [name], MD;  Location: [facility] ORS;  Service: General;  Laterality: N/A;    Social History   Socioeconomic History  . Marital status: Married    Spouse name: Not on file  . Number of children: Not on file  . Years of education: Not on file  . Highest education level: Not on file  Occupational History  . Not on file  Tobacco Use  . Smoking status: Never Smoker  . Smokeless tobacco: Never Used  Vaping Use  . Vaping Use: Never used  Substance and Sexual Activity  . Alcohol use: No  . Drug use: No  . Sexual activity: Not Currently  Other Topics Concern  . Not on file  Social History Narrative  . Not on file   Social Determinants of Health   Financial Resource Strain:   . Difficulty of Paying Living Expenses: Not on file  Food Insecurity:   . Worried About Charity fundraiser in the Last Year: Not on file  . Ran Out of Food in the Last Year: Not on file  Transportation Needs:   . Lack of Transportation (Medical): Not on file  . Lack of Transportation (Non-Medical): Not on file  Physical Activity:   . Days of Exercise per Week: Not on file  . Minutes of Exercise per Session: Not on file  Stress:   . Feeling of Stress : Not on file  Social Connections:   . Frequency of Communication with Friends and Family: Not on file  . Frequency of Social Gatherings with Friends and  Family: Not on file  . Attends Religious Services: Not on file  . Active Member of Clubs or Organizations: Not on file  . Attends Archivist Meetings: Not on file  . Marital Status: Not on file  Intimate Partner Violence:   . Fear of Current or Ex-Partner: Not on file  . Emotionally Abused: Not on file  . Physically Abused: Not on file  . Sexually Abused: Not on file    Family History  Problem Relation Age of Onset  . Diabetes Mother   . AAA (abdominal aortic aneurysm) Mother   . Coronary artery disease Mother   . Diabetes Father    . Coronary artery disease Father   . Dementia Father   . Breast cancer Sister      Current Outpatient Medications:  .  acetaminophen (TYLENOL) 500 MG tablet, Take 500 mg by mouth every 6 (six) hours as needed (for pain.)., Disp: , Rfl:  .  aspirin EC 81 MG tablet, Take 81 mg by mouth daily., Disp: , Rfl:  .  Docusate Sodium (COLACE PO), Take 1 Dose by mouth as needed., Disp: , Rfl:  .  Dulaglutide 0.75 MG/0.5ML SOPN, Inject 1.5 mg into the skin once a week. , Disp: , Rfl:  .  gabapentin (NEURONTIN) 300 MG capsule, TAKE 3 CAPSULES (900 MG TOTAL) BY MOUTH 2 (TWO) TIMES DAILY., Disp: 180 capsule, Rfl: 2 .  glimepiride (AMARYL) 2 MG tablet, Take 2 mg by mouth 2 (two) times daily before a meal. , Disp: , Rfl:  .  lidocaine-prilocaine (EMLA) cream, Place a small amount over cream over port 1 hour before each treatment, cover cream with saran wrap for clothing protection (Patient not taking: Reported on 06/19/2020), Disp: 30 g, Rfl: 1 .  loratadine (CLARITIN) 10 MG tablet, Take 10 mg by mouth daily., Disp: , Rfl:  .  losartan (COZAAR) 25 MG tablet, Take 25 mg by mouth daily., Disp: , Rfl:  .  metFORMIN (GLUCOPHAGE) 1000 MG tablet, Take 1,000-1,500 tablets by mouth See admin instructions. TAKE 1 TABLET (1000 MG) BY MOUTH IN THE MORNING & TAKE 1.5 TABLETS (1500 MG) BY MOUTH AT SUPPER, Disp: , Rfl:  .  ondansetron (ZOFRAN) 8 MG tablet, Take 1 tablet (8 mg total) by mouth every 8 (eight) hours as needed for nausea or vomiting. (Patient not taking: Reported on 06/19/2020), Disp: 20 tablet, Rfl: 0 .  ONETOUCH VERIO test strip, CHECK FASTING BLOOD GLUCOSE ONCE DAILY, Disp: , Rfl:  .  pravastatin (PRAVACHOL) 20 MG tablet, Take 20 mg by mouth daily with supper. , Disp: , Rfl:  .  triamcinolone (KENALOG) 0.025 % cream, Apply 1 application topically daily as needed (FOR EZCEMA)., Disp: 30 g, Rfl: 2 No current facility-administered medications for this visit.  Facility-Administered Medications Ordered in Other  Visits:  .  0.9 %  sodium chloride infusion, , Intravenous, Continuous, Sindy Guadeloupe, MD, Stopped at 07/27/18 1037 .  heparin lock flush 100 unit/mL, 500 Units, Intravenous, Once, Sindy Guadeloupe, MD .  sodium chloride flush (NS) 0.9 % injection 10 mL, 10 mL, Intravenous, PRN, Sindy Guadeloupe, MD, 10 mL at 07/27/18 0920 .  sodium chloride flush (NS) 0.9 % injection 10 mL, 10 mL, Intravenous, Once, Sindy Guadeloupe, MD  Physical exam:  Vitals:   07/03/20 0840  BP: (!) 143/58  Pulse: 73  Resp: 16  Temp: 98.2 F (36.8 C)  TempSrc: Oral  Weight: 184 lb 9.6 oz (83.7 kg)  Height: 5'  6" (1.676 m)   Physical Exam HENT:     Head: Normocephalic and atraumatic.  Eyes:     Pupils: Pupils are equal, round, and reactive to light.  Cardiovascular:     Rate and Rhythm: Normal rate and regular rhythm.     Heart sounds: Normal heart sounds.  Pulmonary:     Effort: Pulmonary effort is normal.     Breath sounds: Normal breath sounds.  Abdominal:     General: Bowel sounds are normal.     Palpations: Abdomen is soft.  Musculoskeletal:     Cervical back: Normal range of motion.  Skin:    General: Skin is warm and dry.  Neurological:     Mental Status: He is alert and oriented to person, place, and time.      CMP Latest Ref Rng & Units 07/03/2020  Glucose 70 - 99 mg/dL 209(H)  BUN 8 - 23 mg/dL 13  Creatinine 0.61 - 1.24 mg/dL 1.08  Sodium 135 - 145 mmol/L 137  Potassium 3.5 - 5.1 mmol/L 4.2  Chloride 98 - 111 mmol/L 106  CO2 22 - 32 mmol/L 22  Calcium 8.9 - 10.3 mg/dL 8.5(L)  Total Protein 6.5 - 8.1 g/dL 6.5  Total Bilirubin 0.3 - 1.2 mg/dL 0.5  Alkaline Phos 38 - 126 U/L 92  AST 15 - 41 U/L 39  ALT 0 - 44 U/L 28   CBC Latest Ref Rng & Units 07/03/2020  WBC 4.0 - 10.5 K/uL 7.7  Hemoglobin 13.0 - 17.0 g/dL 10.1(L)  Hematocrit 39 - 52 % 32.1(L)  Platelets 150 - 400 K/uL 158      Assessment and plan- Patient is a 79 y.o. male with stage IV colon cancer and lung metastases. He has  had progression on FOLFOXZirabevchemotherapy.He is here for on treatment assessment prior to cycle 18 of FOLFIRI Zirabev chemotherapy  Counts okay to proceed with cycle 18 of FOLFIRI Zirabev chemotherapy today. Blood pressure is stable and urine protein remains trace. He has ongoing chemo-induced anemia with a hemoglobin that has remained stable around 10. Repeat CT chest abdomen and pelvis with contrast is due in 2 weeks. He will come back on day 3 for pump disconnect and receive Udenyca.  I will see him back in 3 weeks with CBC with differential, CMP and urine protein for cycle 19 of FOLFIRI Zirabev chemotherapy.  Chemo-induced peripheral neuropathy: Patient reports increased sedation with gabapentin.  I will switch him to Lyrica 75 mg twice daily   Visit Diagnosis 1. Encounter for antineoplastic chemotherapy   2. Malignant neoplasm metastatic to lung, unspecified laterality ([place])   3. 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[place])   4. Antineoplastic chemotherapy induced anemia   5. Chemotherapy-induced peripheral neuropathy ([place])      Dr. Randa Evens, MD, MPH [facility] [phone] 07/03/2020 9:26 AM

## 2020-07-05 ENCOUNTER — Other Ambulatory Visit: Payer: Self-pay

## 2020-07-05 ENCOUNTER — Inpatient Hospital Stay: Payer: Medicare Other

## 2020-07-05 DIAGNOSIS — Z5112 Encounter for antineoplastic immunotherapy: Secondary | ICD-10-CM | POA: Diagnosis not present

## 2020-07-05 DIAGNOSIS — C7800 Secondary malignant neoplasm of unspecified lung: Secondary | ICD-10-CM

## 2020-07-05 DIAGNOSIS — C189 Malignant neoplasm of colon, unspecified: Secondary | ICD-10-CM

## 2020-07-05 MED ORDER — HEPARIN SOD (PORK) LOCK FLUSH 100 UNIT/ML IV SOLN
INTRAVENOUS | Status: AC
  Filled 2020-07-05: qty 5

## 2020-07-05 MED ORDER — PEGFILGRASTIM-CBQV 6 MG/0.6ML ~~LOC~~ SOSY
6.0000 mg | PREFILLED_SYRINGE | Freq: Once | SUBCUTANEOUS | Status: AC
  Administered 2020-07-05: 6 mg via SUBCUTANEOUS
  Filled 2020-07-05: qty 0.6

## 2020-07-05 MED ORDER — SODIUM CHLORIDE 0.9% FLUSH
10.0000 mL | INTRAVENOUS | Status: DC | PRN
  Administered 2020-07-05: 10 mL
  Filled 2020-07-05: qty 10

## 2020-07-05 MED ORDER — HEPARIN SOD (PORK) LOCK FLUSH 100 UNIT/ML IV SOLN
500.0000 [IU] | Freq: Once | INTRAVENOUS | Status: AC | PRN
  Administered 2020-07-05: 500 [IU]
  Filled 2020-07-05: qty 5

## 2020-07-10 ENCOUNTER — Ambulatory Visit: Payer: Medicare Other | Admitting: Oncology

## 2020-07-10 ENCOUNTER — Ambulatory Visit: Payer: Medicare Other

## 2020-07-10 ENCOUNTER — Other Ambulatory Visit: Payer: Medicare Other

## 2020-07-20 ENCOUNTER — Other Ambulatory Visit: Payer: Self-pay

## 2020-07-20 ENCOUNTER — Ambulatory Visit
Admission: RE | Admit: 2020-07-20 | Discharge: 2020-07-20 | Disposition: A | Discharge status: Home / Self Care | Payer: Medicare Other | Source: Ambulatory Visit | Attending: Oncology | Admitting: Oncology

## 2020-07-20 DIAGNOSIS — C7800 Secondary malignant neoplasm of unspecified lung: Secondary | ICD-10-CM | POA: Diagnosis present

## 2020-07-20 DIAGNOSIS — C189 Malignant neoplasm of colon, unspecified: Secondary | ICD-10-CM

## 2020-07-20 MED ORDER — IOHEXOL 350 MG/ML SOLN: 100.0000 mL | Freq: Once | INTRAVENOUS | Status: DC | PRN

## 2020-07-20 MED ORDER — IOHEXOL 300 MG/ML  SOLN
100.0000 mL | Freq: Once | INTRAMUSCULAR | Status: AC | PRN
  Administered 2020-07-20: 100 mL via INTRAVENOUS

## 2020-07-23 ENCOUNTER — Telehealth: Payer: Self-pay | Admitting: *Deleted

## 2020-07-23 NOTE — Telephone Encounter (Signed)
Pt called to ask if it is ok for him to send papers FMLA for [employer] for 2 days a week to help him around his house. rao ok with this and he will bring paper tom.

## 2020-07-24 ENCOUNTER — Inpatient Hospital Stay: Payer: Medicare Other | Attending: Oncology

## 2020-07-24 ENCOUNTER — Encounter: Payer: Self-pay | Admitting: Oncology

## 2020-07-24 ENCOUNTER — Other Ambulatory Visit: Payer: Self-pay

## 2020-07-24 ENCOUNTER — Inpatient Hospital Stay (HOSPITAL_BASED_OUTPATIENT_CLINIC_OR_DEPARTMENT_OTHER): Payer: Medicare Other | Admitting: Oncology

## 2020-07-24 ENCOUNTER — Inpatient Hospital Stay: Payer: Medicare Other

## 2020-07-24 VITALS — BP 150/64 | HR 76 | Temp 98.1°F | Resp 16 | Ht 66.0 in | Wt 177.9 lb

## 2020-07-24 DIAGNOSIS — C189 Malignant neoplasm of colon, unspecified: Secondary | ICD-10-CM | POA: Diagnosis not present

## 2020-07-24 DIAGNOSIS — Z5111 Encounter for antineoplastic chemotherapy: Secondary | ICD-10-CM | POA: Diagnosis not present

## 2020-07-24 DIAGNOSIS — Z5112 Encounter for antineoplastic immunotherapy: Secondary | ICD-10-CM

## 2020-07-24 DIAGNOSIS — C7800 Secondary malignant neoplasm of unspecified lung: Secondary | ICD-10-CM

## 2020-07-24 DIAGNOSIS — G620 Drug-induced polyneuropathy: Secondary | ICD-10-CM

## 2020-07-24 DIAGNOSIS — C187 Malignant neoplasm of sigmoid colon: Secondary | ICD-10-CM | POA: Insufficient documentation

## 2020-07-24 DIAGNOSIS — T451X5A Adverse effect of antineoplastic and immunosuppressive drugs, initial encounter: Secondary | ICD-10-CM

## 2020-07-24 DIAGNOSIS — Z5189 Encounter for other specified aftercare: Secondary | ICD-10-CM | POA: Diagnosis not present

## 2020-07-24 LAB — COMPREHENSIVE METABOLIC PANEL
ALT: 34 U/L (ref 0–44)
AST: 48 U/L — ABNORMAL HIGH (ref 15–41)
Albumin: 4 g/dL (ref 3.5–5.0)
Alkaline Phosphatase: 81 U/L (ref 38–126)
Anion gap: 10 (ref 5–15)
BUN: 17 mg/dL (ref 8–23)
CO2: 22 mmol/L (ref 22–32)
Calcium: 8.9 mg/dL (ref 8.9–10.3)
Chloride: 106 mmol/L (ref 98–111)
Creatinine, Ser: 0.96 mg/dL (ref 0.61–1.24)
GFR calc Af Amer: >60 mL/min (ref >60)
GFR calc non Af Amer: >60 mL/min (ref >60)
Glucose, Bld: 188 mg/dL — ABNORMAL HIGH (ref 70–99)
Potassium: 4.8 mmol/L (ref 3.5–5.1)
Sodium: 138 mmol/L (ref 135–145)
Total Bilirubin: 0.6 mg/dL (ref 0.3–1.2)
Total Protein: 7.1 g/dL (ref 6.5–8.1)

## 2020-07-24 LAB — PROTEIN, URINE, RANDOM: Total Protein, Urine: 10 mg/dL

## 2020-07-24 LAB — CBC WITH DIFFERENTIAL/PLATELET
Abs Immature Granulocytes: 0.06 10*3/uL (ref 0.00–0.07)
Basophils Absolute: 0.1 10*3/uL (ref 0.0–0.1)
Basophils Relative: 1 %
Eosinophils Absolute: 0.3 10*3/uL (ref 0.0–0.5)
Eosinophils Relative: 3 %
HCT: 34.7 % — ABNORMAL LOW (ref 39.0–52.0)
Hemoglobin: 10.8 g/dL — ABNORMAL LOW (ref 13.0–17.0)
Immature Granulocytes: 1 %
Lymphocytes Relative: 26 %
Lymphs Abs: 2.2 10*3/uL (ref 0.7–4.0)
MCH: 28.7 pg (ref 26.0–34.0)
MCHC: 31.1 g/dL (ref 30.0–36.0)
MCV: 92.3 fL (ref 80.0–100.0)
Monocytes Absolute: 0.7 10*3/uL (ref 0.1–1.0)
Monocytes Relative: 8 %
Neutro Abs: 5.4 10*3/uL (ref 1.7–7.7)
Neutrophils Relative %: 61 %
Platelets: 238 10*3/uL (ref 150–400)
RBC: 3.76 MIL/uL — ABNORMAL LOW (ref 4.22–5.81)
RDW: 20.1 % — ABNORMAL HIGH (ref 11.5–15.5)
WBC: 8.8 10*3/uL (ref 4.0–10.5)
nRBC: 0 % (ref 0.0–0.2)

## 2020-07-24 MED ORDER — LEUCOVORIN CALCIUM INJECTION 350 MG
800.0000 mg | Freq: Once | INTRAVENOUS | Status: AC
  Administered 2020-07-24: 800 mg via INTRAVENOUS
  Filled 2020-07-24: qty 5

## 2020-07-24 MED ORDER — DEXAMETHASONE SODIUM PHOSPHATE 10 MG/ML IJ SOLN
10.0000 mg | Freq: Once | INTRAMUSCULAR | Status: AC
  Administered 2020-07-24: 10 mg via INTRAVENOUS
  Filled 2020-07-24: qty 1

## 2020-07-24 MED ORDER — IRINOTECAN HCL CHEMO INJECTION 100 MG/5ML
150.0000 mg/m2 | Freq: Once | INTRAVENOUS | Status: DC
  Filled 2020-07-24: qty 15

## 2020-07-24 MED ORDER — SODIUM CHLORIDE 0.9 % IV SOLN
4800.0000 mg | INTRAVENOUS | Status: DC
  Administered 2020-07-24: 4800 mg via INTRAVENOUS
  Filled 2020-07-24: qty 96

## 2020-07-24 MED ORDER — ATROPINE SULFATE 1 MG/ML IJ SOLN
0.5000 mg | Freq: Once | INTRAMUSCULAR | Status: AC
  Administered 2020-07-24: 0.5 mg via INTRAVENOUS
  Filled 2020-07-24: qty 1

## 2020-07-24 MED ORDER — LEUCOVORIN CALCIUM INJECTION 350 MG
402.0000 mg/m2 | Freq: Once | INTRAVENOUS | Status: DC
  Filled 2020-07-24: qty 40

## 2020-07-24 MED ORDER — PALONOSETRON HCL INJECTION 0.25 MG/5ML: 0.2500 mg | Freq: Once | INTRAVENOUS | Status: DC

## 2020-07-24 MED ORDER — SODIUM CHLORIDE 0.9 % IV SOLN
Freq: Once | INTRAVENOUS | Status: AC
  Filled 2020-07-24: qty 250

## 2020-07-24 MED ORDER — FLUOROURACIL CHEMO INJECTION 2.5 GM/50ML
400.0000 mg/m2 | Freq: Once | INTRAVENOUS | Status: AC
  Administered 2020-07-24: 800 mg via INTRAVENOUS
  Filled 2020-07-24: qty 16

## 2020-07-24 MED ORDER — SODIUM CHLORIDE 0.9 % IV SOLN
300.0000 mg | Freq: Once | INTRAVENOUS | Status: AC
  Administered 2020-07-24: 300 mg via INTRAVENOUS
  Filled 2020-07-24: qty 15

## 2020-07-24 MED ORDER — FLUOROURACIL CHEMO INJECTION 2.5 GM/50ML
400.0000 mg/m2 | Freq: Once | INTRAVENOUS | Status: DC
  Filled 2020-07-24: qty 16

## 2020-07-24 MED ORDER — DEXAMETHASONE SODIUM PHOSPHATE 10 MG/ML IJ SOLN: 10.0000 mg | Freq: Once | INTRAMUSCULAR | Status: DC

## 2020-07-24 MED ORDER — SODIUM CHLORIDE 0.9 % IV SOLN
5.0000 mg/kg | Freq: Once | INTRAVENOUS | Status: DC
  Filled 2020-07-24: qty 16

## 2020-07-24 MED ORDER — PALONOSETRON HCL INJECTION 0.25 MG/5ML
0.2500 mg | Freq: Once | INTRAVENOUS | Status: AC
  Administered 2020-07-24: 0.25 mg via INTRAVENOUS
  Filled 2020-07-24: qty 5

## 2020-07-24 MED ORDER — SODIUM CHLORIDE 0.9 % IV SOLN
5.0000 mg/kg | Freq: Once | INTRAVENOUS | Status: AC
  Administered 2020-07-24: 400 mg via INTRAVENOUS
  Filled 2020-07-24: qty 16

## 2020-07-24 MED ORDER — ATROPINE SULFATE 1 MG/ML IJ SOLN: 0.5000 mg | Freq: Once | INTRAMUSCULAR | Status: DC | PRN

## 2020-07-24 MED ORDER — SODIUM CHLORIDE 0.9 % IV SOLN
2400.0000 mg/m2 | INTRAVENOUS | Status: DC
  Filled 2020-07-24: qty 96

## 2020-07-24 NOTE — Progress Notes (Signed)
Pt eating good, drinking good. Bowels are good. No concerns.

## 2020-07-25 ENCOUNTER — Telehealth: Payer: Self-pay | Admitting: *Deleted

## 2020-07-25 NOTE — Telephone Encounter (Signed)
Pt called today and said that he potentially was exposed to covid. he said yest. Granddaugher's dad brought her and dropped her off at pt's house to work on computer.he hugged her and then she went to the other room. he did not have mask on at home and neither did granddaughter. Today he says granddaughter had fever and was tested posistive for covid .he has no symptoms .wanted to see how to d/c pump and get inj. Tom.  Ranelle Oyster said we should do it in the car. I have spoke to [facility] and she says he will pull up under covered area and he will call me on my cell. then I spoke to registration and they will arrive him on the computer and Maudie Mercury will come and take port off and give inj. In the car. I called pt and let him know the plan call us when he gets here and then front desk can log him in and they can take the bracelet and stuff they need to d/c pump and give inj. Pt agreeable and told him to call if he starts having sx of covid

## 2020-07-26 ENCOUNTER — Inpatient Hospital Stay: Payer: Medicare Other

## 2020-07-26 ENCOUNTER — Other Ambulatory Visit: Payer: Self-pay

## 2020-07-26 DIAGNOSIS — C7800 Secondary malignant neoplasm of unspecified lung: Secondary | ICD-10-CM

## 2020-07-26 DIAGNOSIS — C189 Malignant neoplasm of colon, unspecified: Secondary | ICD-10-CM

## 2020-07-26 DIAGNOSIS — Z5111 Encounter for antineoplastic chemotherapy: Secondary | ICD-10-CM | POA: Diagnosis not present

## 2020-07-26 MED ORDER — SODIUM CHLORIDE 0.9% FLUSH
10.0000 mL | INTRAVENOUS | Status: DC | PRN
  Administered 2020-07-26: 10 mL
  Filled 2020-07-26: qty 10

## 2020-07-26 MED ORDER — PEGFILGRASTIM-CBQV 6 MG/0.6ML ~~LOC~~ SOSY
6.0000 mg | PREFILLED_SYRINGE | Freq: Once | SUBCUTANEOUS | Status: AC
  Administered 2020-07-26: 6 mg via SUBCUTANEOUS
  Filled 2020-07-26: qty 0.6

## 2020-07-26 MED ORDER — HEPARIN SOD (PORK) LOCK FLUSH 100 UNIT/ML IV SOLN
500.0000 [IU] | Freq: Once | INTRAVENOUS | Status: AC | PRN
  Administered 2020-07-26: 500 [IU]
  Filled 2020-07-26: qty 5

## 2020-07-26 NOTE — Progress Notes (Signed)
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 At that time he had progressive abdominal distention and no bowel movement for about one week. CT abdomen showed an apple core lesion in the descending/sigmoid colon.  2. Patient underwent Hartmann's procedure for obstructing sigmoid colon mass on 02/06/2017. Preoperative CEA was 5.0  3. 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 It is 0 out of 15 lymph nodes were positive for malignancy. Perineural and lymphovascular invasion was present. Margins were negative.pT3N0. MMR stable.  4. Given that he had high risk stage II colon cancer including he presented with obstruction and has LVI and perineural invasion- adjuvant xeloda chemotherapy was recommended for 6 months.   5.Patient had evidence of hand foot syndrome after cycle 3. Cycle 4 delayed by 1 week. Topical urea cream prescribed  6. Patient completed 8 cycles of adjuvant xeloda in October 2018. polps seen in sigmoid and decending colon but negative for malignancy  7. Repeat Ct abdomen after 8 cycles showed no metastatic disease. 6 mm lung nodule noted in RLL  8.Patient had a repeat colonoscopy in October 2018 which showed polyps in his transverse and  descending colon which were taken out and were negative for high-grade dysplasia or malignancy. Patient subsequently underwent colostomy takedown procedure by Dr. Burt Knack  9. Repeat CT chest abdomen/ pelvis showed increase in the size of previously seen lung nodules largest being 1.4 cm consistent with metastatic disease. PET/CT showed mild uptake in 2 lung nodules. Others were not hypermetabolic. No other evidence of metastatic disease  10. Repeat lung biopsy consistent with colon adenocarcinoma. Comprehensive RAS panel testing showed mutant KRAS  11.Given the slow rate of growth of his colon cancer and desire to maintain his quality of life plan was to start chemotherapy after he gets a 52-monthbreak in summer. Repeat CT chest abdomen and pelvis in August 2019 showed mild increase in the size of his lung nodules. No new lung nodules are no new sites of distant metastatic disease.FOLFOX Avastin chemotherapy started on 06/22/2018.Patient completed 12 cycles of FOLFOX Avastin chemotherapy with continued response to his lung lesions. Oxaliplatin was dropped and patient is currently on 5-FU Avastin.He did not have mild progression of her disease in lung nodules and was switched to second line FOLFIRI Mvasi  12. NGSshowed no actionable mutations. PD-L1 less than 1%.No evidence of HER2 and NTRKmutations.   Interval history-he is here with his sons today.  Reports feeling well overall and tolerating chemotherapy well without any significant side effects.  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  ECOG PS- 1 Pain scale- 0   Review of systems- Review of Systems  Constitutional: Negative for chills, fever, malaise/fatigue and weight loss.  HENT: Negative for congestion, ear discharge and nosebleeds.   Eyes: Negative for blurred vision.  Respiratory: Negative for cough, hemoptysis, sputum production, shortness of breath and wheezing.  Cardiovascular: Negative for chest pain,  palpitations, orthopnea and claudication.  Gastrointestinal: Negative for abdominal pain, blood in stool, constipation, diarrhea, heartburn, melena, nausea and vomiting.  Genitourinary: Negative for dysuria, flank pain, frequency, hematuria and urgency.  Musculoskeletal: Negative for back pain, joint pain and myalgias.  Skin: Negative for rash.  Neurological: Positive for sensory change (Peripheral neuropathy). Negative for dizziness, tingling, focal weakness, seizures, weakness and headaches.  Endo/Heme/Allergies: Does not bruise/bleed easily.  Psychiatric/Behavioral: Negative for depression and suicidal ideas. The patient does not have insomnia.        No Known Allergies   Past Medical History:  Diagnosis Date  . Cancer ([place])    skin cancer-nose w graft,also shoulder- basal cell per pt  . Cataract    r eye  . Chronic kidney disease    kidney stones 20 y ago per pt  . Colon cancer ([place]) 2018   Surgical resection and chemo tx's.  . Diabetes mellitus without complication ([place])   . Eczema   . History of kidney stones   . Hyperlipidemia   . Hypertension   . Vertigo      Past Surgical History:  Procedure Laterality Date  . CATARACT EXTRACTION Left   . COLECTOMY WITH COLOSTOMY CREATION/HARTMANN PROCEDURE N/A 02/06/2017   Procedure: COLECTOMY WITH COLOSTOMY CREATION/HARTMANN PROCEDURE;  Surgeon: [name], MD;  Location: [facility] ORS;  Service: General;  Laterality: N/A;  . COLON SURGERY    . COLONOSCOPY WITH PROPOFOL N/A 09/04/2017   Procedure: COLONOSCOPY WITH PROPOFOL;  Surgeon: Jonathon Bellows, MD;  Location: [facility] ENDOSCOPY;  Service: Gastroenterology;  Laterality: N/A;  . COLOSTOMY CLOSURE N/A 10/20/2017   Procedure: COLOSTOMY CLOSURE;  Surgeon: [name], MD;  Location: [facility] ORS;  Service: General;  Laterality: N/A;  . CYSTOSCOPY    . FLEXIBLE SIGMOIDOSCOPY N/A 02/06/2017   Procedure: FLEXIBLE SIGMOIDOSCOPY;  Surgeon: Christene Lye, MD;  Location: [facility]  ENDOSCOPY;  Service: Endoscopy;  Laterality: N/A;  . PORTACATH PLACEMENT N/A 06/17/2018   Procedure: INSERTION PORT-A-CATH, WITH FLUOROSCOPY;  Surgeon: [name], MD;  Location: [facility] ORS;  Service: General;  Laterality: N/A;    Social History   Socioeconomic History  . Marital status: Married    Spouse name: Not on file  . Number of children: Not on file  . Years of education: Not on file  . Highest education level: Not on file  Occupational History  . Not on file  Tobacco Use  . Smoking status: Never Smoker  . Smokeless tobacco: Never Used  Vaping Use  . Vaping Use: Never used  Substance and Sexual Activity  . Alcohol use: No  . Drug use: No  . Sexual activity: Not Currently  Other Topics Concern  . Not on file  Social History Narrative  . Not on file   Social Determinants of Health   Financial Resource Strain:   . Difficulty of Paying Living Expenses: Not on file  Food Insecurity:   . Worried About Charity fundraiser in the Last Year: Not on file  . Ran Out of Food in the Last Year: Not on file  Transportation Needs:   . Lack of Transportation (Medical): Not on file  . Lack of Transportation (Non-Medical): Not on file  Physical Activity:   . Days of Exercise per Week: Not on file  . Minutes of Exercise per Session: Not on file  Stress:   . Feeling of Stress : Not on file  Social Connections:   . Frequency  of Communication with Friends and Family: Not on file  . Frequency of Social Gatherings with Friends and Family: Not on file  . Attends Religious Services: Not on file  . Active Member of Clubs or Organizations: Not on file  . Attends Banker Meetings: Not on file  . Marital Status: Not on file  Intimate Partner Violence:   . Fear of Current or Ex-Partner: Not on file  . Emotionally Abused: Not on file  . Physically Abused: Not on file  . Sexually Abused: Not on file    Family History  Problem Relation Age of Onset  . Diabetes Mother    . AAA (abdominal aortic aneurysm) Mother   . Coronary artery disease Mother   . Diabetes Father   . Coronary artery disease Father   . Dementia Father   . Breast cancer Sister      Current Outpatient Medications:  .  acetaminophen (TYLENOL) 500 MG tablet, Take 1,000 mg by mouth in the morning and at bedtime. , Disp: , Rfl:  .  aspirin EC 81 MG tablet, Take 81 mg by mouth daily., Disp: , Rfl:  .  Dulaglutide 0.75 MG/0.5ML SOPN, Inject 1.5 mg into the skin once a week. , Disp: , Rfl:  .  glimepiride (AMARYL) 2 MG tablet, Take 2 mg by mouth 2 (two) times daily before a meal. , Disp: , Rfl:  .  loratadine (CLARITIN) 10 MG tablet, Take 10 mg by mouth daily., Disp: , Rfl:  .  losartan (COZAAR) 25 MG tablet, Take 25 mg by mouth daily., Disp: , Rfl:  .  metFORMIN (GLUCOPHAGE) 1000 MG tablet, Take 1,000-1,500 tablets by mouth See admin instructions. TAKE 1 TABLET (1000 MG) BY MOUTH IN THE MORNING & TAKE 1.5 TABLETS (1500 MG) BY MOUTH AT SUPPER, Disp: , Rfl:  .  ONETOUCH VERIO test strip, CHECK FASTING BLOOD GLUCOSE ONCE DAILY, Disp: , Rfl:  .  pravastatin (PRAVACHOL) 20 MG tablet, Take 20 mg by mouth daily with supper. , Disp: , Rfl:  .  pregabalin (LYRICA) 75 MG capsule, Take 1 capsule (75 mg total) by mouth 2 (two) times daily., Disp: 60 capsule, Rfl: 2 .  Docusate Sodium (COLACE PO), Take 1 Dose by mouth as needed. (Patient not taking: Reported on 07/03/2020), Disp: , Rfl:  .  lidocaine-prilocaine (EMLA) cream, Place a small amount over cream over port 1 hour before each treatment, cover cream with saran wrap for clothing protection (Patient not taking: Reported on 06/19/2020), Disp: 30 g, Rfl: 1 .  ondansetron (ZOFRAN) 8 MG tablet, Take 1 tablet (8 mg total) by mouth every 8 (eight) hours as needed for nausea or vomiting. (Patient not taking: Reported on 06/19/2020), Disp: 20 tablet, Rfl: 0 .  triamcinolone (KENALOG) 0.025 % cream, Apply 1 application topically daily as needed (FOR EZCEMA). (Patient  not taking: Reported on 07/03/2020), Disp: 30 g, Rfl: 2 No current facility-administered medications for this visit.  Facility-Administered Medications Ordered in Other Visits:  .  0.9 %  sodium chloride infusion, , Intravenous, Continuous, Creig Hines, MD, Stopped at 07/27/18 1037 .  heparin lock flush 100 unit/mL, 500 Units, Intravenous, Once, Creig Hines, MD .  sodium chloride flush (NS) 0.9 % injection 10 mL, 10 mL, Intravenous, PRN, Creig Hines, MD, 10 mL at 07/27/18 0920 .  sodium chloride flush (NS) 0.9 % injection 10 mL, 10 mL, Intravenous, Once, Owens Shark C, MD .  sodium chloride flush (NS) 0.9 % injection 10  mL, 10 mL, Intracatheter, PRN, Sindy Guadeloupe, MD, 10 mL at 07/26/20 1308  Physical exam:  Vitals:   07/24/20 0830  BP: (!) 150/64  Pulse: 76  Resp: 16  Temp: 98.1 F (36.7 C)  TempSrc: Oral  Weight: 177 lb 14.4 oz (80.7 kg)  Height: $Remove'5\' 6"'LVISzRL$  (1.676 m)   Physical Exam Constitutional:      General: He is not in acute distress. Cardiovascular:     Rate and Rhythm: Normal rate and regular rhythm.     Heart sounds: Normal heart sounds.  Pulmonary:     Effort: Pulmonary effort is normal.     Breath sounds: Normal breath sounds.  Abdominal:     General: Bowel sounds are normal.     Palpations: Abdomen is soft.  Skin:    General: Skin is warm and dry.  Neurological:     Mental Status: He is alert and oriented to person, place, and time.      CMP Latest Ref Rng & Units 07/24/2020  Glucose 70 - 99 mg/dL 188(H)  BUN 8 - 23 mg/dL 17  Creatinine 0.61 - 1.24 mg/dL 0.96  Sodium 135 - 145 mmol/L 138  Potassium 3.5 - 5.1 mmol/L 4.8  Chloride 98 - 111 mmol/L 106  CO2 22 - 32 mmol/L 22  Calcium 8.9 - 10.3 mg/dL 8.9  Total Protein 6.5 - 8.1 g/dL 7.1  Total Bilirubin 0.3 - 1.2 mg/dL 0.6  Alkaline Phos 38 - 126 U/L 81  AST 15 - 41 U/L 48(H)  ALT 0 - 44 U/L 34   CBC Latest Ref Rng & Units 07/24/2020  WBC 4.0 - 10.5 K/uL 8.8  Hemoglobin 13.0 - 17.0 g/dL 10.8(L)    Hematocrit 39 - 52 % 34.7(L)  Platelets 150 - 400 K/uL 238    No images are attached to the encounter.  CT CHEST ABDOMEN PELVIS W CONTRAST  Result Date: 07/20/2020 CLINICAL DATA:  Restaging metastatic colon cancer. EXAM: CT CHEST, ABDOMEN, AND PELVIS WITH CONTRAST TECHNIQUE: Multidetector CT imaging of the chest, abdomen and pelvis was performed following the standard protocol during bolus administration of intravenous contrast. CONTRAST:  141mL OMNIPAQUE IOHEXOL 300 MG/ML  SOLN COMPARISON:  Prior CT's 03/27/2020 and 12/06/2019. FINDINGS: CT CHEST FINDINGS Cardiovascular: Atherosclerosis of the aorta, great vessels and coronary arteries. No acute vascular findings. Left subclavian Port-A-Cath extends to the superior cavoatrial junction. The heart size is normal. There is no pericardial effusion. Mediastinum/Nodes: There are no enlarged mediastinal, hilar or axillary lymph nodes. The thyroid gland, trachea and esophagus demonstrate no significant findings. Lungs/Pleura: No pleural effusion or pneumothorax. Bilateral pulmonary nodules are again noted. Spiculated right lower lobe nodule on image 105/4 measures 10 x 10 mm (previously 9 x 7 mm). The lobulated left upper lobe perihilar nodule measures 18 x 17 mm on image 73/4 (previously 16 x 14 mm). Other left upper lobe nodules measuring 10 mm on image 59/4 and 13 x 8 mm on image 81/4 have not significantly changed. No new nodules are identified. Musculoskeletal/Chest wall: No chest wall mass or suspicious osseous findings. CT ABDOMEN AND PELVIS FINDINGS Hepatobiliary: The liver is normal in density without suspicious focal abnormality. Stable focal fat superior to the gallbladder (image 58/2) and tiny low-density lesion in the dome of the right lobe (image 49/2). Gallstones without gallbladder wall thickening or biliary dilatation. Pancreas: Unremarkable. No pancreatic ductal dilatation or surrounding inflammatory changes. Spleen: Normal in size without focal  abnormality. Adrenals/Urinary Tract: Both adrenal glands appear normal. The  kidneys and ureters appear normal. No evidence of urinary tract calculus or hydronephrosis. Possible mild bladder wall thickening versus incomplete distension. Stomach/Bowel: No evidence of bowel wall thickening, distention or surrounding inflammatory change. Stable postsurgical changes from previous sigmoidectomy and anastomosis. The appendix appears normal. Vascular/Lymphatic: There are no enlarged abdominal or pelvic lymph nodes. Stable aortic and branch vessel atherosclerosis. No acute vascular findings. The portal, superior mesenteric and splenic veins are patent. Reproductive: Stable prostatic calcifications. Other: Stable postsurgical changes in the anterior abdominal wall. Stable prominent fat in both inguinal canals. No ascites or peritoneal nodularity. Musculoskeletal: No acute or significant osseous findings. IMPRESSION: 1. Continued slow growth of 2 of the pulmonary nodules consistent with metastatic disease. The additional pulmonary nodules are unchanged. 2. No evidence of metastatic disease within the abdomen or pelvis. 3. Cholelithiasis. 4. Coronary and aortic Atherosclerosis (ICD10-I70.0). Electronically Signed   By: Richardean Sale M.D.   On: 07/20/2020 10:55     Assessment and plan- Patient is a 79 y.o. male with stage IV colon cancer and lung metastases. He has had progression on FOLFOXZirabevchemotherapy. He is here for on treatment assessment prior to cycle 19 of FOLFIRI Zirabev chemotherapy and to discuss CT scan results  I have reviewed CT chest abdomen pelvis images independently and discussed findings with the patient.  Patient has biopsy-proven lung metastases which is overall small and slowly growing .  No new lesions.  He was known to have subcentimeter lung lesions back in May 2018 when he was first diagnosed with colon cancer but they were too small to be biopsied and he was treated as such for stage  II colon cancer.  Given that we are not seeing any significant progression of his lung nodules and no new lesions I plan to continue FOLFIRI Zirabev chemotherapy until progression or toxicity at this time with plans for repeat scans in about 4 months.  He is receiving treatment every 3 weeks for better tolerance as well as to maintain his counts.  He has become slowly anemic over the years with his hemoglobin gradually drifting down from 12-10.  Given that he has low-volume and slow progressive disease it would also be reasonable to give him a break from chemotherapy at some point should he start having more side effects from chemotherapy or develop worsening cytopenias.  I do not think we are at that point yet.  He will therefore proceed with FOLFIRI Zirabev chemotherapy today and I will see him back in 3 weeks for cycle 20.  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 Malignant neoplasm metastatic to lung, unspecified laterality ([place])   2. 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[place])   3. Encounter for monoclonal antibody treatment for malignancy   4. Encounter for antineoplastic chemotherapy   5. Chemotherapy-induced peripheral neuropathy ([place])      Dr. Randa Evens, MD, MPH [facility] [phone] 07/26/2020 1:37 PM

## 2020-07-30 ENCOUNTER — Encounter: Payer: Self-pay | Admitting: Nurse Practitioner

## 2020-07-30 ENCOUNTER — Telehealth: Payer: Self-pay | Admitting: *Deleted

## 2020-07-30 ENCOUNTER — Other Ambulatory Visit (HOSPITAL_COMMUNITY): Payer: Self-pay | Admitting: Nurse Practitioner

## 2020-07-30 DIAGNOSIS — U071 COVID-19: Secondary | ICD-10-CM

## 2020-07-30 NOTE — Progress Notes (Signed)
I connected by phone with [name] on 07/30/2020 at 2:48 PM to discuss the potential use of an new treatment for mild to moderate COVID-19 viral infection in non-hospitalized patients.  This patient is a 79 y.o. male that meets the FDA criteria for Emergency Use Authorization of casirivimab\imdevimab.  Has a (+) direct SARS-CoV-2 viral test result  Has mild or moderate COVID-19   Is ? 79 years of age and weighs ? 40 kg  Is NOT hospitalized due to COVID-19  Is NOT requiring oxygen therapy or requiring an increase in baseline oxygen flow rate due to COVID-19  Is within 10 days of symptom onset  Has at least one of the high risk factor(s) for progression to severe COVID-19 and/or hospitalization as defined in EUA.  Specific high risk criteria : Older age (>/= 80 yo), BMI > 25, Diabetes, Immunosuppressive Disease or Treatment and Cardiovascular disease or hypertension   I have spoken and communicated the following to the patient or parent/caregiver:  1. FDA has authorized the emergency use of casirivimab\imdevimab for the treatment of mild to moderate COVID-19 in adults and pediatric patients with positive results of direct SARS-CoV-2 viral testing who are 44 years of age and older weighing at least 40 kg, and who are at high risk for progressing to severe COVID-19 and/or hospitalization.  2. The significant known and potential risks and benefits of casirivimab\imdevimab, and the extent to which such potential risks and benefits are unknown.  3. Information on available alternative treatments and the risks and benefits of those alternatives, including clinical trials.  4. Patients treated with casirivimab\imdevimab should continue to self-isolate and use infection control measures (e.g., wear mask, isolate, social distance, avoid sharing personal items, clean and disinfect "high touch" surfaces, and frequent handwashing) according to CDC guidelines.   5. The patient or  parent/caregiver has the option to accept or refuse casirivimab\imdevimab .  After reviewing this information with the patient, The patient agreed to proceed with receiving casirivimab\imdevimab infusion and will be provided a copy of the Fact sheet prior to receiving the infusion.Beckey Rutter, [place], AGNP-C [phone] ([place])

## 2020-07-30 NOTE — Telephone Encounter (Signed)
Patient called today to let us know that over the weekend he had taken a Covid home test and it showed positive.  Patient states that he does not have any symptoms.  He says the week of him getting chemotherapy he always has diarrhea and a runny nose.  The runny nose is pretty much all the time ever since he started treatment.  He wanted to know if there is anything special that we would have to do.  Asked Dr. [name] what to do and she says to have him being seen by Lauren see whether or not he needs a monoclonal antibody.  And also his schedule is every 3 weeks so when he comes back in 3 weeks he should be able to come back in however if he starts having any other symptoms that are not his regular with his treatments that he should call anytime in between and we will see what we need to do.  Patient is agreeable to this plan and understands

## 2020-07-31 ENCOUNTER — Other Ambulatory Visit (HOSPITAL_COMMUNITY): Payer: Self-pay

## 2020-07-31 ENCOUNTER — Ambulatory Visit (HOSPITAL_COMMUNITY)
Admission: RE | Admit: 2020-07-31 | Discharge: 2020-07-31 | Disposition: A | Discharge status: Home / Self Care | Payer: Medicare Other | Source: Ambulatory Visit | Attending: Pulmonary Disease | Admitting: Pulmonary Disease

## 2020-07-31 DIAGNOSIS — Z23 Encounter for immunization: Secondary | ICD-10-CM | POA: Diagnosis not present

## 2020-07-31 DIAGNOSIS — U071 COVID-19: Secondary | ICD-10-CM | POA: Insufficient documentation

## 2020-07-31 MED ORDER — ALBUTEROL SULFATE HFA 108 (90 BASE) MCG/ACT IN AERS: 2.0000 | INHALATION_SPRAY | Freq: Once | RESPIRATORY_TRACT | Status: DC | PRN

## 2020-07-31 MED ORDER — EPINEPHRINE 0.3 MG/0.3ML IJ SOAJ: 0.3000 mg | Freq: Once | INTRAMUSCULAR | Status: DC | PRN

## 2020-07-31 MED ORDER — DIPHENHYDRAMINE HCL 50 MG/ML IJ SOLN: 50.0000 mg | Freq: Once | INTRAMUSCULAR | Status: DC | PRN

## 2020-07-31 MED ORDER — FAMOTIDINE IN NACL 20-0.9 MG/50ML-% IV SOLN: 20.0000 mg | Freq: Once | INTRAVENOUS | Status: DC | PRN

## 2020-07-31 MED ORDER — SODIUM CHLORIDE 0.9 % IV SOLN: INTRAVENOUS | Status: DC | PRN

## 2020-07-31 MED ORDER — METHYLPREDNISOLONE SODIUM SUCC 125 MG IJ SOLR: 125.0000 mg | Freq: Once | INTRAMUSCULAR | Status: DC | PRN

## 2020-07-31 MED ORDER — SODIUM CHLORIDE 0.9 % IV SOLN
1200.0000 mg | Freq: Once | INTRAVENOUS | Status: AC
  Administered 2020-07-31: 1200 mg via INTRAVENOUS

## 2020-07-31 NOTE — Progress Notes (Signed)
°  Diagnosis: COVID-19  Physician: Dr. Asencion Noble  Procedure: [facility] Med: casirivimab\imdevimab infusion - Provided patient with casirivimab\imdevimab fact sheet for patients, parents and caregivers prior to infusion.  Complications: No immediate complications noted.  Discharge: Discharged home   Miguel Flores 07/31/2020

## 2020-07-31 NOTE — Discharge Instructions (Signed)

## 2020-08-09 ENCOUNTER — Other Ambulatory Visit: Payer: Self-pay | Admitting: Oncology

## 2020-08-14 ENCOUNTER — Other Ambulatory Visit: Payer: Self-pay

## 2020-08-14 ENCOUNTER — Inpatient Hospital Stay: Payer: Medicare Other | Attending: Oncology

## 2020-08-14 ENCOUNTER — Inpatient Hospital Stay: Payer: Medicare Other

## 2020-08-14 ENCOUNTER — Inpatient Hospital Stay (HOSPITAL_BASED_OUTPATIENT_CLINIC_OR_DEPARTMENT_OTHER): Payer: Medicare Other | Admitting: Oncology

## 2020-08-14 VITALS — BP 134/59 | HR 70 | Temp 97.8°F | Resp 16 | Wt 177.1 lb

## 2020-08-14 DIAGNOSIS — C189 Malignant neoplasm of colon, unspecified: Secondary | ICD-10-CM

## 2020-08-14 DIAGNOSIS — C7800 Secondary malignant neoplasm of unspecified lung: Secondary | ICD-10-CM

## 2020-08-14 DIAGNOSIS — Z5112 Encounter for antineoplastic immunotherapy: Secondary | ICD-10-CM

## 2020-08-14 DIAGNOSIS — Z5189 Encounter for other specified aftercare: Secondary | ICD-10-CM | POA: Insufficient documentation

## 2020-08-14 DIAGNOSIS — G620 Drug-induced polyneuropathy: Secondary | ICD-10-CM | POA: Diagnosis not present

## 2020-08-14 DIAGNOSIS — Z5111 Encounter for antineoplastic chemotherapy: Secondary | ICD-10-CM | POA: Diagnosis not present

## 2020-08-14 DIAGNOSIS — D6481 Anemia due to antineoplastic chemotherapy: Secondary | ICD-10-CM

## 2020-08-14 DIAGNOSIS — C187 Malignant neoplasm of sigmoid colon: Secondary | ICD-10-CM | POA: Insufficient documentation

## 2020-08-14 DIAGNOSIS — Z23 Encounter for immunization: Secondary | ICD-10-CM | POA: Diagnosis not present

## 2020-08-14 DIAGNOSIS — T451X5A Adverse effect of antineoplastic and immunosuppressive drugs, initial encounter: Secondary | ICD-10-CM

## 2020-08-14 LAB — COMPREHENSIVE METABOLIC PANEL
ALT: 37 U/L (ref 0–44)
AST: 48 U/L — ABNORMAL HIGH (ref 15–41)
Albumin: 4.1 g/dL (ref 3.5–5.0)
Alkaline Phosphatase: 86 U/L (ref 38–126)
Anion gap: 13 (ref 5–15)
BUN: 20 mg/dL (ref 8–23)
CO2: 21 mmol/L — ABNORMAL LOW (ref 22–32)
Calcium: 8.6 mg/dL — ABNORMAL LOW (ref 8.9–10.3)
Chloride: 105 mmol/L (ref 98–111)
Creatinine, Ser: 1.04 mg/dL (ref 0.61–1.24)
GFR calc non Af Amer: >60 mL/min (ref >60)
Glucose, Bld: 184 mg/dL — ABNORMAL HIGH (ref 70–99)
Potassium: 4.2 mmol/L (ref 3.5–5.1)
Sodium: 139 mmol/L (ref 135–145)
Total Bilirubin: 0.8 mg/dL (ref 0.3–1.2)
Total Protein: 7.3 g/dL (ref 6.5–8.1)

## 2020-08-14 LAB — CBC WITH DIFFERENTIAL/PLATELET
Abs Immature Granulocytes: 0.03 10*3/uL (ref 0.00–0.07)
Basophils Absolute: 0.1 10*3/uL (ref 0.0–0.1)
Basophils Relative: 1 %
Eosinophils Absolute: 0.3 10*3/uL (ref 0.0–0.5)
Eosinophils Relative: 4 %
HCT: 34.3 % — ABNORMAL LOW (ref 39.0–52.0)
Hemoglobin: 10.7 g/dL — ABNORMAL LOW (ref 13.0–17.0)
Immature Granulocytes: 0 %
Lymphocytes Relative: 26 %
Lymphs Abs: 2 10*3/uL (ref 0.7–4.0)
MCH: 28.5 pg (ref 26.0–34.0)
MCHC: 31.2 g/dL (ref 30.0–36.0)
MCV: 91.5 fL (ref 80.0–100.0)
Monocytes Absolute: 0.7 10*3/uL (ref 0.1–1.0)
Monocytes Relative: 9 %
Neutro Abs: 4.7 10*3/uL (ref 1.7–7.7)
Neutrophils Relative %: 60 %
Platelets: 172 10*3/uL (ref 150–400)
RBC: 3.75 MIL/uL — ABNORMAL LOW (ref 4.22–5.81)
RDW: 19.5 % — ABNORMAL HIGH (ref 11.5–15.5)
WBC: 7.9 10*3/uL (ref 4.0–10.5)
nRBC: 0 % (ref 0.0–0.2)

## 2020-08-14 LAB — PROTEIN, URINE, RANDOM: Total Protein, Urine: 16 mg/dL

## 2020-08-14 MED ORDER — PALONOSETRON HCL INJECTION 0.25 MG/5ML
0.2500 mg | Freq: Once | INTRAVENOUS | Status: AC
  Administered 2020-08-14: 0.25 mg via INTRAVENOUS
  Filled 2020-08-14: qty 5

## 2020-08-14 MED ORDER — SODIUM CHLORIDE 0.9 % IV SOLN
150.0000 mg/m2 | Freq: Once | INTRAVENOUS | Status: AC
  Administered 2020-08-14: 300 mg via INTRAVENOUS
  Filled 2020-08-14: qty 15

## 2020-08-14 MED ORDER — SODIUM CHLORIDE 0.9 % IV SOLN
402.0000 mg/m2 | Freq: Once | INTRAVENOUS | Status: AC
  Administered 2020-08-14: 800 mg via INTRAVENOUS
  Filled 2020-08-14: qty 25

## 2020-08-14 MED ORDER — ATROPINE SULFATE 1 MG/ML IJ SOLN
0.5000 mg | Freq: Once | INTRAMUSCULAR | Status: AC | PRN
  Administered 2020-08-14: 0.5 mg via INTRAVENOUS
  Filled 2020-08-14: qty 1

## 2020-08-14 MED ORDER — FLUOROURACIL CHEMO INJECTION 2.5 GM/50ML
400.0000 mg/m2 | Freq: Once | INTRAVENOUS | Status: AC
  Administered 2020-08-14: 800 mg via INTRAVENOUS
  Filled 2020-08-14: qty 16

## 2020-08-14 MED ORDER — SODIUM CHLORIDE 0.9 % IV SOLN
5.0000 mg/kg | Freq: Once | INTRAVENOUS | Status: AC
  Administered 2020-08-14: 400 mg via INTRAVENOUS
  Filled 2020-08-14: qty 16

## 2020-08-14 MED ORDER — DEXAMETHASONE SODIUM PHOSPHATE 10 MG/ML IJ SOLN
10.0000 mg | Freq: Once | INTRAMUSCULAR | Status: AC
  Administered 2020-08-14: 10 mg via INTRAVENOUS
  Filled 2020-08-14: qty 1

## 2020-08-14 MED ORDER — SODIUM CHLORIDE 0.9 % IV SOLN
Freq: Once | INTRAVENOUS | Status: AC
  Filled 2020-08-14: qty 250

## 2020-08-14 MED ORDER — SODIUM CHLORIDE 0.9 % IV SOLN
2400.0000 mg/m2 | INTRAVENOUS | Status: DC
  Administered 2020-08-14: 4800 mg via INTRAVENOUS
  Filled 2020-08-14: qty 96

## 2020-08-14 NOTE — Progress Notes (Signed)
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 At that time he had progressive abdominal distention and no bowel movement for about one week. CT abdomen showed an apple core lesion in the descending/sigmoid colon.  2. Patient underwent Hartmann's procedure for obstructing sigmoid colon mass on 02/06/2017. Preoperative CEA was 5.0  3. 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 It is 0 out of 15 lymph nodes were positive for malignancy. Perineural and lymphovascular invasion was present. Margins were negative.pT3N0. MMR stable.  4. Given that he had high risk stage II colon cancer including he presented with obstruction and has LVI and perineural invasion- adjuvant xeloda chemotherapy was recommended for 6 months.   5.Patient had evidence of hand foot syndrome after cycle 3. Cycle 4 delayed by 1 week. Topical urea cream prescribed  6. Patient completed 8 cycles of adjuvant xeloda in October 2018. polps seen in sigmoid and decending colon but negative for malignancy  7. Repeat Ct abdomen after 8 cycles showed no metastatic disease. 6 mm lung nodule noted in RLL  8.Patient had a repeat colonoscopy in October 2018 which showed polyps in his transverse and  descending colon which were taken out and were negative for high-grade dysplasia or malignancy. Patient subsequently underwent colostomy takedown procedure by Dr. Burt Knack  9. Repeat CT chest abdomen/ pelvis showed increase in the size of previously seen lung nodules largest being 1.4 cm consistent with metastatic disease. PET/CT showed mild uptake in 2 lung nodules. Others were not hypermetabolic. No other evidence of metastatic disease  10. Repeat lung biopsy consistent with colon adenocarcinoma. Comprehensive RAS panel testing showed mutant KRAS  11.Given the slow rate of growth of his colon cancer and desire to maintain his quality of life plan was to start chemotherapy after he gets a 42-monthbreak in summer. Repeat CT chest abdomen and pelvis in August 2019 showed mild increase in the size of his lung nodules. No new lung nodules are no new sites of distant metastatic disease.FOLFOX Avastin chemotherapy started on 06/22/2018.Patient completed 12 cycles of FOLFOX Avastin chemotherapy with continued response to his lung lesions. Oxaliplatin was dropped and patient is currently on 5-FU Avastin.He did not have mild progression of her disease in lung nodules and was switched to second line FOLFIRI Mvasi  12. NGSshowed no actionable mutations. PD-L1 less than 1%.No evidence of HER2 and NTRKmutations.   Interval history-neuropathy is still bothersome in his feet. He was recently tested positive for Covid about 3 weeks ago. He was relatively asymptomatic and received antibody infusion. He has also received his 2 doses of Covid vaccine  ECOG PS- 1 Pain scale- 0   Review of systems- Review of Systems  Constitutional: Negative for chills, fever, malaise/fatigue and weight loss.  HENT: Negative for congestion, ear discharge and nosebleeds.   Eyes: Negative for blurred vision.  Respiratory: Negative for cough, hemoptysis, sputum production,  shortness of breath and wheezing.     Cardiovascular: Negative for chest pain, palpitations, orthopnea and claudication.  Gastrointestinal: Negative for abdominal pain, blood in stool, constipation, diarrhea, heartburn, melena, nausea and vomiting.  Genitourinary: Negative for dysuria, flank pain, frequency, hematuria and urgency.  Musculoskeletal: Negative for back pain, joint pain and myalgias.  Skin: Negative for rash.  Neurological: Positive for sensory change (Peripheral neuropathy). Negative for dizziness, tingling, focal weakness, seizures, weakness and headaches.  Endo/Heme/Allergies: Does not bruise/bleed easily.  Psychiatric/Behavioral: Negative for depression and suicidal ideas. The patient does not have insomnia.        No Known Allergies   Past Medical History:  Diagnosis Date  . Cancer ([place])    skin cancer-nose w graft,also shoulder- basal cell per pt  . Cataract    r eye  . Chronic kidney disease    kidney stones 20 y ago per pt  . Colon cancer ([place]) 2018   Surgical resection and chemo tx's.  . Diabetes mellitus without complication ([place])   . Eczema   . History of kidney stones   . Hyperlipidemia   . Hypertension   . Vertigo      Past Surgical History:  Procedure Laterality Date  . CATARACT EXTRACTION Left   . COLECTOMY WITH COLOSTOMY CREATION/HARTMANN PROCEDURE N/A 02/06/2017   Procedure: COLECTOMY WITH COLOSTOMY CREATION/HARTMANN PROCEDURE;  Surgeon: [name], MD;  Location: [facility] ORS;  Service: General;  Laterality: N/A;  . COLON SURGERY    . COLONOSCOPY WITH PROPOFOL N/A 09/04/2017   Procedure: COLONOSCOPY WITH PROPOFOL;  Surgeon: Jonathon Bellows, MD;  Location[facility] ENDOSCOPY;  Service: Gastroenterology;  Laterality: N/A;  . COLOSTOMY CLOSURE N/A 10/20/2017   Procedure: COLOSTOMY CLOSURE;  Surgeon: [name], MD;  Location: [facility] ORS;  Service: General;  Laterality: N/A;  . CYSTOSCOPY    . FLEXIBLE SIGMOIDOSCOPY N/A 02/06/2017   Procedure: FLEXIBLE SIGMOIDOSCOPY;  Surgeon:  Christene Lye, MD;  Location: [facility] ENDOSCOPY;  Service: Endoscopy;  Laterality: N/A;  . PORTACATH PLACEMENT N/A 06/17/2018   Procedure: INSERTION PORT-A-CATH, WITH FLUOROSCOPY;  Surgeon: [name], MD;  Location: [facility] ORS;  Service: General;  Laterality: N/A;    Social History   Socioeconomic History  . Marital status: Married    Spouse name: Not on file  . Number of children: Not on file  . Years of education: Not on file  . Highest education level: Not on file  Occupational History  . Not on file  Tobacco Use  . Smoking status: Never Smoker  . Smokeless tobacco: Never Used  Vaping Use  . Vaping Use: Never used  Substance and Sexual Activity  . Alcohol use: No  . Drug use: No  . Sexual activity: Not Currently  Other Topics Concern  . Not on file  Social History Narrative  . Not on file   Social Determinants of Health   Financial Resource Strain:   . Difficulty of Paying Living Expenses: Not on file  Food Insecurity:   . Worried About Charity fundraiser in the Last Year: Not on file  . Ran Out of Food in the Last Year: Not on file  Transportation Needs:   . Lack of Transportation (Medical): Not on file  . Lack of Transportation (Non-Medical): Not on file  Physical Activity:   . Days of Exercise per Week: Not on file  . Minutes of Exercise per Session: Not on file  Stress:   . Feeling of Stress : Not on  file  Social Connections:   . Frequency of Communication with Friends and Family: Not on file  . Frequency of Social Gatherings with Friends and Family: Not on file  . Attends Religious Services: Not on file  . Active Member of Clubs or Organizations: Not on file  . Attends Archivist Meetings: Not on file  . Marital Status: Not on file  Intimate Partner Violence:   . Fear of Current or Ex-Partner: Not on file  . Emotionally Abused: Not on file  . Physically Abused: Not on file  . Sexually Abused: Not on file    Family History  Problem  Relation Age of Onset  . Diabetes Mother   . AAA (abdominal aortic aneurysm) Mother   . Coronary artery disease Mother   . Diabetes Father   . Coronary artery disease Father   . Dementia Father   . Breast cancer Sister      Current Outpatient Medications:  .  acetaminophen (TYLENOL) 500 MG tablet, Take 1,000 mg by mouth in the morning and at bedtime. , Disp: , Rfl:  .  aspirin EC 81 MG tablet, Take 81 mg by mouth daily., Disp: , Rfl:  .  Dulaglutide 0.75 MG/0.5ML SOPN, Inject 1.5 mg into the skin once a week. , Disp: , Rfl:  .  glimepiride (AMARYL) 2 MG tablet, Take 2 mg by mouth 2 (two) times daily before a meal. , Disp: , Rfl:  .  loratadine (CLARITIN) 10 MG tablet, Take 10 mg by mouth daily., Disp: , Rfl:  .  losartan (COZAAR) 25 MG tablet, Take 25 mg by mouth daily., Disp: , Rfl:  .  ONETOUCH VERIO test strip, CHECK FASTING BLOOD GLUCOSE ONCE DAILY, Disp: , Rfl:  .  pravastatin (PRAVACHOL) 20 MG tablet, Take 20 mg by mouth daily with supper. , Disp: , Rfl:  .  pregabalin (LYRICA) 75 MG capsule, Take 1 capsule (75 mg total) by mouth 2 (two) times daily., Disp: 60 capsule, Rfl: 2 .  triamcinolone (KENALOG) 0.025 % cream, Apply 1 application topically daily as needed (FOR EZCEMA)., Disp: 30 g, Rfl: 2 .  Docusate Sodium (COLACE PO), Take 1 Dose by mouth as needed. (Patient not taking: Reported on 07/03/2020), Disp: , Rfl:  .  lidocaine-prilocaine (EMLA) cream, Place a small amount over cream over port 1 hour before each treatment, cover cream with saran wrap for clothing protection (Patient not taking: Reported on 06/19/2020), Disp: 30 g, Rfl: 1 .  metFORMIN (GLUCOPHAGE) 1000 MG tablet, Take 1,000-1,500 tablets by mouth See admin instructions. TAKE 1 TABLET (1000 MG) BY MOUTH IN THE MORNING & TAKE 1.5 TABLETS (1500 MG) BY MOUTH AT SUPPER, Disp: , Rfl:  .  ondansetron (ZOFRAN) 8 MG tablet, Take 1 tablet (8 mg total) by mouth every 8 (eight) hours as needed for nausea or vomiting. (Patient not  taking: Reported on 06/19/2020), Disp: 20 tablet, Rfl: 0 No current facility-administered medications for this visit.  Facility-Administered Medications Ordered in Other Visits:  .  0.9 %  sodium chloride infusion, , Intravenous, Continuous, Sindy Guadeloupe, MD, Stopped at 07/27/18 1037 .  fluorouracil (ADRUCIL) 4,800 mg in sodium chloride 0.9 % 54 mL chemo infusion, 2,400 mg/m2 (Treatment Plan Recorded), Intravenous, 1 day or 1 dose, Sindy Guadeloupe, MD, 4,800 mg at 08/14/20 1245 .  heparin lock flush 100 unit/mL, 500 Units, Intravenous, Once, Sindy Guadeloupe, MD .  sodium chloride flush (NS) 0.9 % injection 10 mL, 10 mL, Intravenous, PRN, [name],  Weston Anna, MD, 10 mL at 07/27/18 0920 .  sodium chloride flush (NS) 0.9 % injection 10 mL, 10 mL, Intravenous, Once, Sindy Guadeloupe, MD  Physical exam:  Vitals:   08/14/20 0849  BP: (!) 134/59  Pulse: 70  Resp: 16  Temp: 97.8 F (36.6 C)  TempSrc: Tympanic  SpO2: 98%  Weight: 177 lb 1.6 oz (80.3 kg)   Physical Exam Constitutional:      General: He is not in acute distress. Cardiovascular:     Rate and Rhythm: Normal rate and regular rhythm.     Heart sounds: Normal heart sounds.  Pulmonary:     Effort: Pulmonary effort is normal.     Breath sounds: Normal breath sounds.  Abdominal:     General: Bowel sounds are normal.     Palpations: Abdomen is soft.  Skin:    General: Skin is warm and dry.  Neurological:     Mental Status: He is alert and oriented to person, place, and time.      CMP Latest Ref Rng & Units 08/14/2020  Glucose 70 - 99 mg/dL 184(H)  BUN 8 - 23 mg/dL 20  Creatinine 0.61 - 1.24 mg/dL 1.04  Sodium 135 - 145 mmol/L 139  Potassium 3.5 - 5.1 mmol/L 4.2  Chloride 98 - 111 mmol/L 105  CO2 22 - 32 mmol/L 21(L)  Calcium 8.9 - 10.3 mg/dL 8.6(L)  Total Protein 6.5 - 8.1 g/dL 7.3  Total Bilirubin 0.3 - 1.2 mg/dL 0.8  Alkaline Phos 38 - 126 U/L 86  AST 15 - 41 U/L 48(H)  ALT 0 - 44 U/L 37   CBC Latest Ref Rng & Units  08/14/2020  WBC 4.0 - 10.5 K/uL 7.9  Hemoglobin 13.0 - 17.0 g/dL 10.7(L)  Hematocrit 39 - 52 % 34.3(L)  Platelets 150 - 400 K/uL 172    No images are attached to the encounter.  CT CHEST ABDOMEN PELVIS W CONTRAST  Result Date: 07/20/2020 CLINICAL DATA:  Restaging metastatic colon cancer. EXAM: CT CHEST, ABDOMEN, AND PELVIS WITH CONTRAST TECHNIQUE: Multidetector CT imaging of the chest, abdomen and pelvis was performed following the standard protocol during bolus administration of intravenous contrast. CONTRAST:  171m OMNIPAQUE IOHEXOL 300 MG/ML  SOLN COMPARISON:  Prior CT's 03/27/2020 and 12/06/2019. FINDINGS: CT CHEST FINDINGS Cardiovascular: Atherosclerosis of the aorta, great vessels and coronary arteries. No acute vascular findings. Left subclavian Port-A-Cath extends to the superior cavoatrial junction. The heart size is normal. There is no pericardial effusion. Mediastinum/Nodes: There are no enlarged mediastinal, hilar or axillary lymph nodes. The thyroid gland, trachea and esophagus demonstrate no significant findings. Lungs/Pleura: No pleural effusion or pneumothorax. Bilateral pulmonary nodules are again noted. Spiculated right lower lobe nodule on image 105/4 measures 10 x 10 mm (previously 9 x 7 mm). The lobulated left upper lobe perihilar nodule measures 18 x 17 mm on image 73/4 (previously 16 x 14 mm). Other left upper lobe nodules measuring 10 mm on image 59/4 and 13 x 8 mm on image 81/4 have not significantly changed. No new nodules are identified. Musculoskeletal/Chest wall: No chest wall mass or suspicious osseous findings. CT ABDOMEN AND PELVIS FINDINGS Hepatobiliary: The liver is normal in density without suspicious focal abnormality. Stable focal fat superior to the gallbladder (image 58/2) and tiny low-density lesion in the dome of the right lobe (image 49/2). Gallstones without gallbladder wall thickening or biliary dilatation. Pancreas: Unremarkable. No pancreatic ductal dilatation  or surrounding inflammatory changes. Spleen: Normal in size without focal  abnormality. Adrenals/Urinary Tract: Both adrenal glands appear normal. The kidneys and ureters appear normal. No evidence of urinary tract calculus or hydronephrosis. Possible mild bladder wall thickening versus incomplete distension. Stomach/Bowel: No evidence of bowel wall thickening, distention or surrounding inflammatory change. Stable postsurgical changes from previous sigmoidectomy and anastomosis. The appendix appears normal. Vascular/Lymphatic: There are no enlarged abdominal or pelvic lymph nodes. Stable aortic and branch vessel atherosclerosis. No acute vascular findings. The portal, superior mesenteric and splenic veins are patent. Reproductive: Stable prostatic calcifications. Other: Stable postsurgical changes in the anterior abdominal wall. Stable prominent fat in both inguinal canals. No ascites or peritoneal nodularity. Musculoskeletal: No acute or significant osseous findings. IMPRESSION: 1. Continued slow growth of 2 of the pulmonary nodules consistent with metastatic disease. The additional pulmonary nodules are unchanged. 2. No evidence of metastatic disease within the abdomen or pelvis. 3. Cholelithiasis. 4. Coronary and aortic Atherosclerosis (ICD10-I70.0). Electronically Signed   By: Richardean Sale M.D.   On: 07/20/2020 10:55     Assessment and plan- Patient is a 79 y.o. male with stage IV colon cancer and lung metastases. He has had progression on FOLFOXZirabevchemotherapy. He is here for on treatment assessment prior to cycle 19 of FOLFIRI Zirabev chemotherapy. He is here for on treatment assessment prior to cycle 20 of FOLFIRI Zirabev chemotherapy  Counts okay to proceed with cycle 20 of FOLFIRI Zirabev chemotherapy today. He will come back on day 3 for pump disconnect and receive urine either. So far he is tolerating chemotherapy well without any significant side effects.  Chemo-induced anemia:  Hemoglobin is currently stable between 10-11. If it starts drifting down to the 9's, I may hold his chemotherapy briefly to allow for count recovery.  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 He is currently on Lyrica. If symptoms do not improve over the next 3 to 4 weeks I will consider adding Cymbalta     Visit Diagnosis 1. Encounter for antineoplastic chemotherapy   2. Encounter for monoclonal antibody treatment for malignancy   3. 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[place])   4. Chemotherapy-induced peripheral neuropathy ([place])   5. Antineoplastic chemotherapy induced anemia      Dr. Randa Evens, MD, MPH [facility] [phone] 08/14/2020 1:18 PM

## 2020-08-16 ENCOUNTER — Other Ambulatory Visit: Payer: Self-pay | Admitting: Oncology

## 2020-08-16 ENCOUNTER — Inpatient Hospital Stay: Payer: Medicare Other

## 2020-08-16 ENCOUNTER — Other Ambulatory Visit: Payer: Self-pay

## 2020-08-16 VITALS — BP 144/66 | HR 67 | Temp 97.1°F | Resp 17

## 2020-08-16 DIAGNOSIS — C189 Malignant neoplasm of colon, unspecified: Secondary | ICD-10-CM

## 2020-08-16 DIAGNOSIS — Z23 Encounter for immunization: Secondary | ICD-10-CM

## 2020-08-16 DIAGNOSIS — C7800 Secondary malignant neoplasm of unspecified lung: Secondary | ICD-10-CM

## 2020-08-16 DIAGNOSIS — Z5111 Encounter for antineoplastic chemotherapy: Secondary | ICD-10-CM | POA: Diagnosis not present

## 2020-08-16 MED ORDER — INFLUENZA VAC A&B SA ADJ QUAD 0.5 ML IM PRSY: 0.5000 mL | PREFILLED_SYRINGE | Freq: Once | INTRAMUSCULAR | Status: DC

## 2020-08-16 MED ORDER — INFLUENZA VAC A&B SA ADJ QUAD 0.5 ML IM PRSY
0.5000 mL | PREFILLED_SYRINGE | Freq: Once | INTRAMUSCULAR | Status: AC
  Administered 2020-08-16: 0.5 mL via INTRAMUSCULAR
  Filled 2020-08-16: qty 0.5

## 2020-08-16 MED ORDER — SODIUM CHLORIDE 0.9% FLUSH
10.0000 mL | INTRAVENOUS | Status: DC | PRN
  Administered 2020-08-16: 10 mL
  Filled 2020-08-16: qty 10

## 2020-08-16 MED ORDER — PEGFILGRASTIM-CBQV 6 MG/0.6ML ~~LOC~~ SOSY
6.0000 mg | PREFILLED_SYRINGE | Freq: Once | SUBCUTANEOUS | Status: AC
  Administered 2020-08-16: 6 mg via SUBCUTANEOUS
  Filled 2020-08-16: qty 0.6

## 2020-08-16 MED ORDER — HEPARIN SOD (PORK) LOCK FLUSH 100 UNIT/ML IV SOLN
INTRAVENOUS | Status: AC
  Filled 2020-08-16: qty 5

## 2020-08-16 MED ORDER — HEPARIN SOD (PORK) LOCK FLUSH 100 UNIT/ML IV SOLN
500.0000 [IU] | Freq: Once | INTRAVENOUS | Status: AC | PRN
  Administered 2020-08-16: 500 [IU]
  Filled 2020-08-16: qty 5

## 2020-09-04 ENCOUNTER — Inpatient Hospital Stay: Payer: Medicare Other

## 2020-09-04 ENCOUNTER — Encounter: Payer: Self-pay | Admitting: Oncology

## 2020-09-04 ENCOUNTER — Other Ambulatory Visit: Payer: Self-pay

## 2020-09-04 ENCOUNTER — Inpatient Hospital Stay (HOSPITAL_BASED_OUTPATIENT_CLINIC_OR_DEPARTMENT_OTHER): Payer: Medicare Other | Admitting: Oncology

## 2020-09-04 VITALS — BP 167/73 | HR 60 | Resp 16

## 2020-09-04 VITALS — BP 152/68 | HR 70 | Temp 98.2°F | Resp 16 | Ht 66.0 in | Wt 180.7 lb

## 2020-09-04 DIAGNOSIS — C7800 Secondary malignant neoplasm of unspecified lung: Secondary | ICD-10-CM

## 2020-09-04 DIAGNOSIS — G620 Drug-induced polyneuropathy: Secondary | ICD-10-CM | POA: Diagnosis not present

## 2020-09-04 DIAGNOSIS — C189 Malignant neoplasm of colon, unspecified: Secondary | ICD-10-CM

## 2020-09-04 DIAGNOSIS — Z5111 Encounter for antineoplastic chemotherapy: Secondary | ICD-10-CM | POA: Diagnosis not present

## 2020-09-04 DIAGNOSIS — Z5112 Encounter for antineoplastic immunotherapy: Secondary | ICD-10-CM

## 2020-09-04 DIAGNOSIS — D6481 Anemia due to antineoplastic chemotherapy: Secondary | ICD-10-CM

## 2020-09-04 DIAGNOSIS — T451X5A Adverse effect of antineoplastic and immunosuppressive drugs, initial encounter: Secondary | ICD-10-CM

## 2020-09-04 LAB — CBC WITH DIFFERENTIAL/PLATELET
Abs Immature Granulocytes: 0.03 10*3/uL (ref 0.00–0.07)
Basophils Absolute: 0 10*3/uL (ref 0.0–0.1)
Basophils Relative: 1 %
Eosinophils Absolute: 0.2 10*3/uL (ref 0.0–0.5)
Eosinophils Relative: 4 %
HCT: 32 % — ABNORMAL LOW (ref 39.0–52.0)
Hemoglobin: 9.8 g/dL — ABNORMAL LOW (ref 13.0–17.0)
Immature Granulocytes: 1 %
Lymphocytes Relative: 26 %
Lymphs Abs: 1.5 10*3/uL (ref 0.7–4.0)
MCH: 28.2 pg (ref 26.0–34.0)
MCHC: 30.6 g/dL (ref 30.0–36.0)
MCV: 92.2 fL (ref 80.0–100.0)
Monocytes Absolute: 0.5 10*3/uL (ref 0.1–1.0)
Monocytes Relative: 9 %
Neutro Abs: 3.5 10*3/uL (ref 1.7–7.7)
Neutrophils Relative %: 59 %
Platelets: 144 10*3/uL — ABNORMAL LOW (ref 150–400)
RBC: 3.47 MIL/uL — ABNORMAL LOW (ref 4.22–5.81)
RDW: 19.5 % — ABNORMAL HIGH (ref 11.5–15.5)
WBC: 5.8 10*3/uL (ref 4.0–10.5)
nRBC: 0 % (ref 0.0–0.2)

## 2020-09-04 LAB — COMPREHENSIVE METABOLIC PANEL
ALT: 44 U/L (ref 0–44)
AST: 71 U/L — ABNORMAL HIGH (ref 15–41)
Albumin: 3.7 g/dL (ref 3.5–5.0)
Alkaline Phosphatase: 102 U/L (ref 38–126)
Anion gap: 8 (ref 5–15)
BUN: 13 mg/dL (ref 8–23)
CO2: 23 mmol/L (ref 22–32)
Calcium: 8.4 mg/dL — ABNORMAL LOW (ref 8.9–10.3)
Chloride: 107 mmol/L (ref 98–111)
Creatinine, Ser: 0.85 mg/dL (ref 0.61–1.24)
GFR, Estimated: >60 mL/min (ref >60)
Glucose, Bld: 233 mg/dL — ABNORMAL HIGH (ref 70–99)
Potassium: 4.1 mmol/L (ref 3.5–5.1)
Sodium: 138 mmol/L (ref 135–145)
Total Bilirubin: 0.6 mg/dL (ref 0.3–1.2)
Total Protein: 6.6 g/dL (ref 6.5–8.1)

## 2020-09-04 LAB — PROTEIN, URINE, RANDOM: Total Protein, Urine: 37 mg/dL

## 2020-09-04 MED ORDER — PALONOSETRON HCL INJECTION 0.25 MG/5ML
0.2500 mg | Freq: Once | INTRAVENOUS | Status: AC
  Administered 2020-09-04: 0.25 mg via INTRAVENOUS
  Filled 2020-09-04: qty 5

## 2020-09-04 MED ORDER — SODIUM CHLORIDE 0.9 % IV SOLN
2400.0000 mg/m2 | INTRAVENOUS | Status: DC
  Administered 2020-09-04: 4800 mg via INTRAVENOUS
  Filled 2020-09-04: qty 96

## 2020-09-04 MED ORDER — SODIUM CHLORIDE 0.9 % IV SOLN
402.0000 mg/m2 | Freq: Once | INTRAVENOUS | Status: AC
  Administered 2020-09-04: 800 mg via INTRAVENOUS
  Filled 2020-09-04: qty 25

## 2020-09-04 MED ORDER — SODIUM CHLORIDE 0.9 % IV SOLN
150.0000 mg/m2 | Freq: Once | INTRAVENOUS | Status: AC
  Administered 2020-09-04: 300 mg via INTRAVENOUS
  Filled 2020-09-04: qty 15

## 2020-09-04 MED ORDER — DEXAMETHASONE SODIUM PHOSPHATE 10 MG/ML IJ SOLN
10.0000 mg | Freq: Once | INTRAMUSCULAR | Status: AC
  Administered 2020-09-04: 10 mg via INTRAVENOUS
  Filled 2020-09-04: qty 1

## 2020-09-04 MED ORDER — SODIUM CHLORIDE 0.9 % IV SOLN
Freq: Once | INTRAVENOUS | Status: AC
  Filled 2020-09-04: qty 250

## 2020-09-04 MED ORDER — ATROPINE SULFATE 1 MG/ML IJ SOLN
0.5000 mg | Freq: Once | INTRAMUSCULAR | Status: AC | PRN
  Administered 2020-09-04: 0.5 mg via INTRAVENOUS
  Filled 2020-09-04: qty 1

## 2020-09-04 MED ORDER — FLUOROURACIL CHEMO INJECTION 2.5 GM/50ML
400.0000 mg/m2 | Freq: Once | INTRAVENOUS | Status: AC
  Administered 2020-09-04: 800 mg via INTRAVENOUS
  Filled 2020-09-04: qty 16

## 2020-09-04 MED ORDER — SODIUM CHLORIDE 0.9 % IV SOLN
5.0000 mg/kg | Freq: Once | INTRAVENOUS | Status: AC
  Administered 2020-09-04: 400 mg via INTRAVENOUS
  Filled 2020-09-04: qty 16

## 2020-09-04 NOTE — Progress Notes (Signed)
Pt doing well not concerns except with neuropathy and it has not changed. Has to wear shoes because the feet hurt walking on wood floors

## 2020-09-06 ENCOUNTER — Inpatient Hospital Stay: Payer: Medicare Other

## 2020-09-06 ENCOUNTER — Other Ambulatory Visit: Payer: Self-pay

## 2020-09-06 DIAGNOSIS — Z5111 Encounter for antineoplastic chemotherapy: Secondary | ICD-10-CM | POA: Diagnosis not present

## 2020-09-06 DIAGNOSIS — C189 Malignant neoplasm of colon, unspecified: Secondary | ICD-10-CM

## 2020-09-06 DIAGNOSIS — C7800 Secondary malignant neoplasm of unspecified lung: Secondary | ICD-10-CM

## 2020-09-06 MED ORDER — HEPARIN SOD (PORK) LOCK FLUSH 100 UNIT/ML IV SOLN
500.0000 [IU] | Freq: Once | INTRAVENOUS | Status: AC | PRN
  Administered 2020-09-06: 500 [IU]
  Filled 2020-09-06: qty 5

## 2020-09-06 MED ORDER — SODIUM CHLORIDE 0.9% FLUSH
10.0000 mL | INTRAVENOUS | Status: DC | PRN
  Administered 2020-09-06: 10 mL
  Filled 2020-09-06: qty 10

## 2020-09-06 MED ORDER — HEPARIN SOD (PORK) LOCK FLUSH 100 UNIT/ML IV SOLN
INTRAVENOUS | Status: AC
  Filled 2020-09-06: qty 5

## 2020-09-06 MED ORDER — PEGFILGRASTIM-CBQV 6 MG/0.6ML ~~LOC~~ SOSY
6.0000 mg | PREFILLED_SYRINGE | Freq: Once | SUBCUTANEOUS | Status: AC
  Administered 2020-09-06: 6 mg via SUBCUTANEOUS
  Filled 2020-09-06: qty 0.6

## 2020-09-06 NOTE — Progress Notes (Signed)
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 At that time he had progressive abdominal distention and no bowel movement for about one week. CT abdomen showed an apple core lesion in the descending/sigmoid colon.  2. Patient underwent Hartmann's procedure for obstructing sigmoid colon mass on 02/06/2017. Preoperative CEA was 5.0  3. 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 It is 0 out of 15 lymph nodes were positive for malignancy. Perineural and lymphovascular invasion was present. Margins were negative.pT3N0. MMR stable.  4. Given that he had high risk stage II colon cancer including he presented with obstruction and has LVI and perineural invasion- adjuvant xeloda chemotherapy was recommended for 6 months.   5.Patient had evidence of hand foot syndrome after cycle 3. Cycle 4 delayed by 1 week. Topical urea cream prescribed  6. Patient completed 8 cycles of adjuvant xeloda in October 2018. polps seen in sigmoid and decending colon but negative for malignancy  7. Repeat Ct abdomen after 8 cycles showed no metastatic disease. 6 mm lung nodule noted in RLL  8.Patient had a repeat colonoscopy in October 2018 which showed polyps in his transverse  and descending colon which were taken out and were negative for high-grade dysplasia or malignancy. Patient subsequently underwent colostomy takedown procedure by Dr. Burt Knack  9. Repeat CT chest abdomen/ pelvis showed increase in the size of previously seen lung nodules largest being 1.4 cm consistent with metastatic disease. PET/CT showed mild uptake in 2 lung nodules. Others were not hypermetabolic. No other evidence of metastatic disease  10. Repeat lung biopsy consistent with colon adenocarcinoma. Comprehensive RAS panel testing showed mutant KRAS  11.Given the slow rate of growth of his colon cancer and desire to maintain his quality of life plan was to start chemotherapy after he gets a 66-monthbreak in summer. Repeat CT chest abdomen and pelvis in August 2019 showed mild increase in the size of his lung nodules. No new lung nodules are no new sites of distant metastatic disease.FOLFOX Avastin chemotherapy started on 06/22/2018.Patient completed 12 cycles of FOLFOX Avastin chemotherapy with continued response to his lung lesions. Oxaliplatin was dropped and patient is currently on 5-FU Avastin.He did not have mild progression of her disease in lung nodules and was switched to second line FOLFIRI Mvasi  12. NGSshowed no actionable mutations. PD-L1 less than 1%.No evidence of HER2 and NTRKmutations.    Interval history-reports doing well. He does have tingling numbness in his hands and feet for which she is taking Lyrica. Reports that gabapentin worked better for him but he switched due to great time drowsiness with gabapentin.  ECOG PS- 1 Pain scale- 2 Opioid associated constipation- no  Review of systems- Review of Systems  Constitutional: Negative for chills, fever, malaise/fatigue and weight loss.  HENT: Negative for congestion, ear discharge and nosebleeds.   Eyes: Negative for blurred vision.  Respiratory: Negative  for cough, hemoptysis, sputum production,  shortness of breath and wheezing.   Cardiovascular: Negative for chest pain, palpitations, orthopnea and claudication.  Gastrointestinal: Negative for abdominal pain, blood in stool, constipation, diarrhea, heartburn, melena, nausea and vomiting.  Genitourinary: Negative for dysuria, flank pain, frequency, hematuria and urgency.  Musculoskeletal: Negative for back pain, joint pain and myalgias.  Skin: Negative for rash.  Neurological: Positive for sensory change (Peripheral neuropathy). Negative for dizziness, tingling, focal weakness, seizures, weakness and headaches.  Endo/Heme/Allergies: Does not bruise/bleed easily.  Psychiatric/Behavioral: Negative for depression and suicidal ideas. The patient does not have insomnia.       No Known Allergies   Past Medical History:  Diagnosis Date  . Cancer ([place])    skin cancer-nose w graft,also shoulder- basal cell per pt  . Cataract    r eye  . Chronic kidney disease    kidney stones 20 y ago per pt  . Colon cancer ([place]) 2018   Surgical resection and chemo tx's.  . Diabetes mellitus without complication ([place])   . Eczema   . History of kidney stones   . Hyperlipidemia   . Hypertension   . Vertigo      Past Surgical History:  Procedure Laterality Date  . CATARACT EXTRACTION Left   . COLECTOMY WITH COLOSTOMY CREATION/HARTMANN PROCEDURE N/A 02/06/2017   Procedure: COLECTOMY WITH COLOSTOMY CREATION/HARTMANN PROCEDURE;  Surgeon: [name], MD;  Location: [facility] ORS;  Service: General;  Laterality: N/A;  . COLON SURGERY    . COLONOSCOPY WITH PROPOFOL N/A 09/04/2017   Procedure: COLONOSCOPY WITH PROPOFOL;  Surgeon: Jonathon Bellows, MD;  Location: [facility] ENDOSCOPY;  Service: Gastroenterology;  Laterality: N/A;  . COLOSTOMY CLOSURE N/A 10/20/2017   Procedure: COLOSTOMY CLOSURE;  Surgeon: [name], MD;  Location: [facility] ORS;  Service: General;  Laterality: N/A;  . CYSTOSCOPY    . FLEXIBLE SIGMOIDOSCOPY N/A 02/06/2017   Procedure:  FLEXIBLE SIGMOIDOSCOPY;  Surgeon: Christene Lye, MD;  Location: [facility] ENDOSCOPY;  Service: Endoscopy;  Laterality: N/A;  . PORTACATH PLACEMENT N/A 06/17/2018   Procedure: INSERTION PORT-A-CATH, WITH FLUOROSCOPY;  Surgeon: [name], MD;  Location: [facility] ORS;  Service: General;  Laterality: N/A;    Social History   Socioeconomic History  . Marital status: Married    Spouse name: Not on file  . Number of children: Not on file  . Years of education: Not on file  . Highest education level: Not on file  Occupational History  . Not on file  Tobacco Use  . Smoking status: Never Smoker  . Smokeless tobacco: Never Used  Vaping Use  . Vaping Use: Never used  Substance and Sexual Activity  . Alcohol use: No  . Drug use: No  . Sexual activity: Not Currently  Other Topics Concern  . Not on file  Social History Narrative  . Not on file   Social Determinants of Health   Financial Resource Strain:   . Difficulty of Paying Living Expenses: Not on file  Food Insecurity:   . Worried About Charity fundraiser in the Last Year: Not on file  . Ran Out of Food in the Last Year: Not on file  Transportation Needs:   . Lack of Transportation (Medical): Not on file  . Lack of Transportation (Non-Medical): Not on file  Physical Activity:   . Days of Exercise per Week: Not on file  . Minutes of Exercise per Session: Not on file  Stress:   . Feeling of Stress :  Not on file  Social Connections:   . Frequency of Communication with Friends and Family: Not on file  . Frequency of Social Gatherings with Friends and Family: Not on file  . Attends Religious Services: Not on file  . Active Member of Clubs or Organizations: Not on file  . Attends Archivist Meetings: Not on file  . Marital Status: Not on file  Intimate Partner Violence:   . Fear of Current or Ex-Partner: Not on file  . Emotionally Abused: Not on file  . Physically Abused: Not on file  . Sexually Abused: Not on  file    Family History  Problem Relation Age of Onset  . Diabetes Mother   . AAA (abdominal aortic aneurysm) Mother   . Coronary artery disease Mother   . Diabetes Father   . Coronary artery disease Father   . Dementia Father   . Breast cancer Sister      Current Outpatient Medications:  .  acetaminophen (TYLENOL) 500 MG tablet, Take 1,000 mg by mouth in the morning and at bedtime. , Disp: , Rfl:  .  aspirin EC 81 MG tablet, Take 81 mg by mouth daily., Disp: , Rfl:  .  Dulaglutide 0.75 MG/0.5ML SOPN, Inject 1.5 mg into the skin once a week. , Disp: , Rfl:  .  glimepiride (AMARYL) 2 MG tablet, Take 2 mg by mouth 2 (two) times daily before a meal. , Disp: , Rfl:  .  lidocaine-prilocaine (EMLA) cream, Place a small amount over cream over port 1 hour before each treatment, cover cream with saran wrap for clothing protection, Disp: 30 g, Rfl: 1 .  loratadine (CLARITIN) 10 MG tablet, Take 10 mg by mouth daily., Disp: , Rfl:  .  losartan (COZAAR) 25 MG tablet, Take 25 mg by mouth daily., Disp: , Rfl:  .  metFORMIN (GLUCOPHAGE) 1000 MG tablet, Take 1,000-1,500 tablets by mouth See admin instructions. TAKE 1 TABLET (1000 MG) BY MOUTH IN THE MORNING & TAKE 1.5 TABLETS (1500 MG) BY MOUTH AT SUPPER, Disp: , Rfl:  .  ondansetron (ZOFRAN) 8 MG tablet, Take 1 tablet (8 mg total) by mouth every 8 (eight) hours as needed for nausea or vomiting., Disp: 20 tablet, Rfl: 0 .  ONETOUCH VERIO test strip, CHECK FASTING BLOOD GLUCOSE ONCE DAILY, Disp: , Rfl:  .  pravastatin (PRAVACHOL) 20 MG tablet, Take 20 mg by mouth daily with supper. , Disp: , Rfl:  .  pregabalin (LYRICA) 75 MG capsule, Take 1 capsule (75 mg total) by mouth 2 (two) times daily., Disp: 60 capsule, Rfl: 2 .  triamcinolone (KENALOG) 0.025 % cream, Apply 1 application topically daily as needed (FOR EZCEMA)., Disp: 30 g, Rfl: 2 .  Docusate Sodium (COLACE PO), Take 1 Dose by mouth as needed. (Patient not taking: Reported on 07/03/2020), Disp: ,  Rfl:  No current facility-administered medications for this visit.  Facility-Administered Medications Ordered in Other Visits:  .  0.9 %  sodium chloride infusion, , Intravenous, Continuous, Sindy Guadeloupe, MD, Stopped at 07/27/18 1037 .  heparin lock flush 100 unit/mL, 500 Units, Intravenous, Once, Sindy Guadeloupe, MD .  sodium chloride flush (NS) 0.9 % injection 10 mL, 10 mL, Intravenous, PRN, Sindy Guadeloupe, MD, 10 mL at 07/27/18 0920 .  sodium chloride flush (NS) 0.9 % injection 10 mL, 10 mL, Intravenous, Once, Sindy Guadeloupe, MD .  sodium chloride flush (NS) 0.9 % injection 10 mL, 10 mL, Intracatheter, PRN, Sindy Guadeloupe,  MD, 10 mL at 09/06/20 1345  Physical exam:  Vitals:   09/04/20 0843  BP: (!) 152/68  Pulse: 70  Resp: 16  Temp: 98.2 F (36.8 C)  TempSrc: Tympanic  Weight: 180 lb 11.2 oz (82 kg)  Height: _0  (1.676 m)   Physical Exam Constitutional:      General: He is not in acute distress. Cardiovascular:     Rate and Rhythm: Normal rate and regular rhythm.     Heart sounds: Normal heart sounds.  Pulmonary:     Effort: Pulmonary effort is normal.     Breath sounds: Normal breath sounds.  Abdominal:     General: Bowel sounds are normal.     Palpations: Abdomen is soft.  Skin:    General: Skin is warm and dry.  Neurological:     Mental Status: He is alert and oriented to person, place, and time.      CMP Latest Ref Rng & Units 09/04/2020  Glucose 70 - 99 mg/dL 233(H)  BUN 8 - 23 mg/dL 13  Creatinine 0.61 - 1.24 mg/dL 0.85  Sodium 135 - 145 mmol/L 138  Potassium 3.5 - 5.1 mmol/L 4.1  Chloride 98 - 111 mmol/L 107  CO2 22 - 32 mmol/L 23  Calcium 8.9 - 10.3 mg/dL 8.4(L)  Total Protein 6.5 - 8.1 g/dL 6.6  Total Bilirubin 0.3 - 1.2 mg/dL 0.6  Alkaline Phos 38 - 126 U/L 102  AST 15 - 41 U/L 71(H)  ALT 0 - 44 U/L 44   CBC Latest Ref Rng & Units 09/04/2020  WBC 4.0 - 10.5 K/uL 5.8  Hemoglobin 13.0 - 17.0 g/dL 9.8(L)  Hematocrit 39 - 52 % 32.0(L)  Platelets  150 - 400 K/uL 144(L)      Assessment and plan- Patient is a 79 y.o. male with stage IV colon cancer and lung metastases. He has had progression on FOLFOXZirabevchemotherapy.He is here for on treatment assessment prior to cycle 21 of FOLFIRI zirabev chemotherapy  Counseling to proceed with cycle 21 of FOLFIRI is a rather chemotherapy today. He has been having progressive anemia due to prolonged chemotherapy. Today for the first time his hemoglobin is drifted down to 9.8 and previously it was staying stable around 10. We discussed giving him a brief break from chemotherapy especially during the holiday season and I will tentatively see him back in 6 to 7 weeks time for cycle 22. Overall he has low-volume disease which has been relatively stable with chemotherapy. Patient verbalized understanding and is agreeable.  Chemo-induced peripheral neuropathy: We again discussed adding Cymbalta to his Lyrica versus adding opioids to control his neuropathic pain. Patient would like to think about it but not make any changes at this time  Chemo induced anemia: He will get chemotherapy today but his treatment will be on hold for the next 6 weeks.  Patient comes back on day 3 for pump disconnect and receives udenyca  visit Diagnosis 1. 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[place])   2. Malignant neoplasm metastatic to lung, unspecified laterality ([place])   3. Antineoplastic chemotherapy induced anemia      Dr. Randa Evens, MD, MPH [facility] [phone] 09/06/2020 2:55 PM

## 2020-10-14 ENCOUNTER — Other Ambulatory Visit: Payer: Self-pay | Admitting: Oncology

## 2020-10-23 ENCOUNTER — Inpatient Hospital Stay: Payer: Medicare Other

## 2020-10-23 ENCOUNTER — Inpatient Hospital Stay: Payer: Medicare Other | Attending: Oncology

## 2020-10-23 ENCOUNTER — Inpatient Hospital Stay (HOSPITAL_BASED_OUTPATIENT_CLINIC_OR_DEPARTMENT_OTHER): Payer: Medicare Other | Admitting: Oncology

## 2020-10-23 ENCOUNTER — Encounter: Payer: Self-pay | Admitting: Oncology

## 2020-10-23 VITALS — BP 159/61 | HR 70 | Temp 98.2°F | Resp 16 | Ht 66.0 in | Wt 181.9 lb

## 2020-10-23 DIAGNOSIS — C187 Malignant neoplasm of sigmoid colon: Secondary | ICD-10-CM | POA: Insufficient documentation

## 2020-10-23 DIAGNOSIS — Z5111 Encounter for antineoplastic chemotherapy: Secondary | ICD-10-CM | POA: Diagnosis not present

## 2020-10-23 DIAGNOSIS — T451X5A Adverse effect of antineoplastic and immunosuppressive drugs, initial encounter: Secondary | ICD-10-CM

## 2020-10-23 DIAGNOSIS — Z5112 Encounter for antineoplastic immunotherapy: Secondary | ICD-10-CM

## 2020-10-23 DIAGNOSIS — C189 Malignant neoplasm of colon, unspecified: Secondary | ICD-10-CM

## 2020-10-23 DIAGNOSIS — C7800 Secondary malignant neoplasm of unspecified lung: Secondary | ICD-10-CM | POA: Insufficient documentation

## 2020-10-23 DIAGNOSIS — D6481 Anemia due to antineoplastic chemotherapy: Secondary | ICD-10-CM

## 2020-10-23 DIAGNOSIS — G620 Drug-induced polyneuropathy: Secondary | ICD-10-CM | POA: Diagnosis not present

## 2020-10-23 LAB — COMPREHENSIVE METABOLIC PANEL
ALT: 28 U/L (ref 0–44)
AST: 41 U/L (ref 15–41)
Albumin: 4.3 g/dL (ref 3.5–5.0)
Alkaline Phosphatase: 78 U/L (ref 38–126)
Anion gap: 15 (ref 5–15)
BUN: 18 mg/dL (ref 8–23)
CO2: 19 mmol/L — ABNORMAL LOW (ref 22–32)
Calcium: 9.2 mg/dL (ref 8.9–10.3)
Chloride: 100 mmol/L (ref 98–111)
Creatinine, Ser: 0.92 mg/dL (ref 0.61–1.24)
GFR, Estimated: >60 mL/min (ref >60)
Glucose, Bld: 163 mg/dL — ABNORMAL HIGH (ref 70–99)
Potassium: 4.3 mmol/L (ref 3.5–5.1)
Sodium: 134 mmol/L — ABNORMAL LOW (ref 135–145)
Total Bilirubin: 0.7 mg/dL (ref 0.3–1.2)
Total Protein: 7.4 g/dL (ref 6.5–8.1)

## 2020-10-23 LAB — CBC WITH DIFFERENTIAL/PLATELET
Abs Immature Granulocytes: 0.01 10*3/uL (ref 0.00–0.07)
Basophils Absolute: 0.1 10*3/uL (ref 0.0–0.1)
Basophils Relative: 1 %
Eosinophils Absolute: 0.4 10*3/uL (ref 0.0–0.5)
Eosinophils Relative: 7 %
HCT: 35.7 % — ABNORMAL LOW (ref 39.0–52.0)
Hemoglobin: 10.9 g/dL — ABNORMAL LOW (ref 13.0–17.0)
Immature Granulocytes: 0 %
Lymphocytes Relative: 24 %
Lymphs Abs: 1.5 10*3/uL (ref 0.7–4.0)
MCH: 27.3 pg (ref 26.0–34.0)
MCHC: 30.5 g/dL (ref 30.0–36.0)
MCV: 89.5 fL (ref 80.0–100.0)
Monocytes Absolute: 0.5 10*3/uL (ref 0.1–1.0)
Monocytes Relative: 8 %
Neutro Abs: 3.7 10*3/uL (ref 1.7–7.7)
Neutrophils Relative %: 60 %
Platelets: 167 10*3/uL (ref 150–400)
RBC: 3.99 MIL/uL — ABNORMAL LOW (ref 4.22–5.81)
RDW: 16.8 % — ABNORMAL HIGH (ref 11.5–15.5)
WBC: 6.1 10*3/uL (ref 4.0–10.5)
nRBC: 0 % (ref 0.0–0.2)

## 2020-10-23 LAB — PROTEIN, URINE, RANDOM: Total Protein, Urine: 29 mg/dL

## 2020-10-23 MED ORDER — FLUOROURACIL CHEMO INJECTION 2.5 GM/50ML
400.0000 mg/m2 | Freq: Once | INTRAVENOUS | Status: AC
Start: 2020-10-23 — End: 2020-10-23
  Administered 2020-10-23: 12:00:00 800 mg via INTRAVENOUS
  Filled 2020-10-23: qty 16

## 2020-10-23 MED ORDER — SODIUM CHLORIDE 0.9% FLUSH
10.0000 mL | INTRAVENOUS | Status: DC | PRN
  Administered 2020-10-23: 08:00:00 10 mL via INTRAVENOUS
  Filled 2020-10-23: qty 10

## 2020-10-23 MED ORDER — PALONOSETRON HCL INJECTION 0.25 MG/5ML
0.2500 mg | Freq: Once | INTRAVENOUS | Status: AC
  Administered 2020-10-23: 10:00:00 0.25 mg via INTRAVENOUS
  Filled 2020-10-23: qty 5

## 2020-10-23 MED ORDER — SODIUM CHLORIDE 0.9 % IV SOLN
2400.0000 mg/m2 | INTRAVENOUS | Status: AC
  Administered 2020-10-23: 12:00:00 4800 mg via INTRAVENOUS
  Filled 2020-10-23: qty 96

## 2020-10-23 MED ORDER — LEUCOVORIN CALCIUM INJECTION 350 MG
402.0000 mg/m2 | Freq: Once | INTRAMUSCULAR | Status: AC
  Administered 2020-10-23: 10:00:00 800 mg via INTRAVENOUS
  Filled 2020-10-23: qty 40

## 2020-10-23 MED ORDER — SODIUM CHLORIDE 0.9 % IV SOLN
Freq: Once | INTRAVENOUS | Status: AC
  Filled 2020-10-23: qty 250

## 2020-10-23 MED ORDER — DEXTROSE 5 % IV SOLN
150.0000 mg/m2 | Freq: Once | INTRAVENOUS | Status: AC
  Administered 2020-10-23: 10:00:00 300 mg via INTRAVENOUS
  Filled 2020-10-23: qty 15

## 2020-10-23 MED ORDER — ATROPINE SULFATE 1 MG/ML IJ SOLN
0.5000 mg | Freq: Once | INTRAMUSCULAR | Status: AC | PRN
  Administered 2020-10-23: 10:00:00 0.5 mg via INTRAVENOUS
  Filled 2020-10-23: qty 1

## 2020-10-23 MED ORDER — DEXAMETHASONE SODIUM PHOSPHATE 10 MG/ML IJ SOLN
10.0000 mg | Freq: Once | INTRAMUSCULAR | Status: AC
  Administered 2020-10-23: 10:00:00 10 mg via INTRAVENOUS
  Filled 2020-10-23: qty 1

## 2020-10-23 MED ORDER — SODIUM CHLORIDE 0.9 % IV SOLN
5.0000 mg/kg | Freq: Once | INTRAVENOUS | Status: AC
  Administered 2020-10-23: 10:00:00 400 mg via INTRAVENOUS
  Filled 2020-10-23: qty 16

## 2020-10-23 NOTE — Progress Notes (Signed)
Pt has asked to have acupuncture and I have faxed a form to [employer]. Pt thinks he has a rash from new clothes he bought and wore 2-3 times and it has lining in it. He has rash on stomach and back.

## 2020-10-23 NOTE — Progress Notes (Signed)
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 At that time he had progressive abdominal distention and no bowel movement for about one week. CT abdomen showed an apple core lesion in the descending/sigmoid colon.  2. Patient underwent Hartmann's procedure for obstructing sigmoid colon mass on 02/06/2017. Preoperative CEA was 5.0  3. 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 It is 0 out of 15 lymph nodes were positive for malignancy. Perineural and lymphovascular invasion was present. Margins were negative.pT3N0. MMR stable.  4. Given that he had high risk stage II colon cancer including he presented with obstruction and has LVI and perineural invasion- adjuvant xeloda chemotherapy was recommended for 6 months.   5.Patient had evidence of hand foot syndrome after cycle 3. Cycle 4 delayed by 1 week. Topical urea cream prescribed  6. Patient completed 8 cycles of adjuvant xeloda in October 2018. polps seen in sigmoid and decending colon but negative for malignancy  7. Repeat Ct abdomen after 8 cycles showed no metastatic disease. 6 mm lung nodule noted in RLL  8.Patient had a repeat colonoscopy in October 2018 which showed polyps in his  transverse and descending colon which were taken out and were negative for high-grade dysplasia or malignancy. Patient subsequently underwent colostomy takedown procedure by Dr. Burt Knack  9. Repeat CT chest abdomen/ pelvis showed increase in the size of previously seen lung nodules largest being 1.4 cm consistent with metastatic disease. PET/CT showed mild uptake in 2 lung nodules. Others were not hypermetabolic. No other evidence of metastatic disease  10. Repeat lung biopsy consistent with colon adenocarcinoma. Comprehensive RAS panel testing showed mutant KRAS  11.Given the slow rate of growth of his colon cancer and desire to maintain his quality of life plan was to start chemotherapy after he gets a 74-monthbreak in summer. Repeat CT chest abdomen and pelvis in August 2019 showed mild increase in the size of his lung nodules. No new lung nodules are no new sites of distant metastatic disease.FOLFOX Avastin chemotherapy started on 06/22/2018.Patient completed 12 cycles of FOLFOX Avastin chemotherapy with continued response to his lung lesions. Oxaliplatin was dropped and patient is currently on 5-FU Avastin.He did not have mild progression of her disease in lung nodules and was switched to second line FOLFIRI Mvasi  12. NGSshowed no actionable mutations. PD-L1 less than 1%.No evidence of HER2 and NTRKmutations.   Interval history-patient reports having neuropathic Pain in his extremities despite using lyrica.  He would like to try acupuncture.  Otherwise tolerating chemotherapy well without any significant side effects  ECOG PS- 1 Pain scale- 0   Review of systems- Review of Systems  Constitutional: Negative for chills, fever, malaise/fatigue and weight loss.  HENT: Negative for congestion, ear discharge and nosebleeds.   Eyes: Negative for blurred vision.  Respiratory: Negative for cough, hemoptysis, sputum production, shortness of breath and wheezing.  Cardiovascular:  Negative for chest pain, palpitations, orthopnea and claudication.  Gastrointestinal: Negative for abdominal pain, blood in stool, constipation, diarrhea, heartburn, melena, nausea and vomiting.  Genitourinary: Negative for dysuria, flank pain, frequency, hematuria and urgency.  Musculoskeletal: Negative for back pain, joint pain and myalgias.  Skin: Negative for rash.  Neurological: Positive for sensory change (Peripheral neuropathy). Negative for dizziness, tingling, focal weakness, seizures, weakness and headaches.  Endo/Heme/Allergies: Does not bruise/bleed easily.  Psychiatric/Behavioral: Negative for depression and suicidal ideas. The patient does not have insomnia.       No Known Allergies   Past Medical History:  Diagnosis Date  . Cancer ([place])    skin cancer-nose w graft,also shoulder- basal cell per pt  . Cataract    r eye  . Chronic kidney disease    kidney stones 20 y ago per pt  . Colon cancer ([place]) 2018   Surgical resection and chemo tx's.  . Diabetes mellitus without complication ([place])   . Eczema   . History of kidney stones   . Hyperlipidemia   . Hypertension   . Vertigo      Past Surgical History:  Procedure Laterality Date  . CATARACT EXTRACTION Left   . COLECTOMY WITH COLOSTOMY CREATION/HARTMANN PROCEDURE N/A 02/06/2017   Procedure: COLECTOMY WITH COLOSTOMY CREATION/HARTMANN PROCEDURE;  Surgeon: [name], MD;  Location: [facility] ORS;  Service: General;  Laterality: N/A;  . COLON SURGERY    . COLONOSCOPY WITH PROPOFOL N/A 09/04/2017   Procedure: COLONOSCOPY WITH PROPOFOL;  Surgeon: Jonathon Bellows, MD;  Location[facility]COPY;  Service: Gastroenterology;  Laterality: N/A;  . COLOSTOMY CLOSURE N/A 10/20/2017   Procedure: COLOSTOMY CLOSURE;  Surgeon: [name], MD;  Location: [facility] ORS;  Service: General;  Laterality: N/A;  . CYSTOSCOPY    . FLEXIBLE SIGMOIDOSCOPY N/A 02/06/2017   Procedure: FLEXIBLE SIGMOIDOSCOPY;  Surgeon: Christene Lye,  MD;  Location: [facility] ENDOSCOPY;  Service: Endoscopy;  Laterality: N/A;  . PORTACATH PLACEMENT N/A 06/17/2018   Procedure: INSERTION PORT-A-CATH, WITH FLUOROSCOPY;  Surgeon: [name], MD;  Location: [facility] ORS;  Service: General;  Laterality: N/A;    Social History   Socioeconomic History  . Marital status: Married    Spouse name: Not on file  . Number of children: Not on file  . Years of education: Not on file  . Highest education level: Not on file  Occupational History  . Not on file  Tobacco Use  . Smoking status: Never Smoker  . Smokeless tobacco: Never Used  Vaping Use  . Vaping Use: Never used  Substance and Sexual Activity  . Alcohol use: No  . Drug use: No  . Sexual activity: Not Currently  Other Topics Concern  . Not on file  Social History Narrative  . Not on file   Social Determinants of Health   Financial Resource Strain: Not on file  Food Insecurity: Not on file  Transportation Needs: Not on file  Physical Activity: Not on file  Stress: Not on file  Social Connections: Not on file  Intimate Partner Violence: Not on file    Family History  Problem Relation Age of Onset  . Diabetes Mother   . AAA (abdominal aortic aneurysm) Mother   . Coronary artery disease Mother   . Diabetes Father   . Coronary artery disease Father   . Dementia Father   . Breast cancer Sister      Current Outpatient Medications:  .  acetaminophen (TYLENOL) 500 MG tablet, Take  1,000 mg by mouth in the morning and at bedtime., Disp: , Rfl:  .  aspirin EC 81 MG tablet, Take 81 mg by mouth daily., Disp: , Rfl:  .  Dulaglutide 0.75 MG/0.5ML SOPN, Inject 1.5 mg into the skin once a week. , Disp: , Rfl:  .  glimepiride (AMARYL) 2 MG tablet, Take 2 mg by mouth 2 (two) times daily before a meal. , Disp: , Rfl:  .  loratadine (CLARITIN) 10 MG tablet, Take 10 mg by mouth daily., Disp: , Rfl:  .  losartan (COZAAR) 25 MG tablet, Take 25 mg by mouth daily., Disp: , Rfl:  .  metFORMIN  (GLUCOPHAGE) 1000 MG tablet, Take 1,000-1,500 tablets by mouth See admin instructions. TAKE 1 TABLET (1000 MG) BY MOUTH IN THE MORNING & TAKE 1.5 TABLETS (1500 MG) BY MOUTH AT SUPPER, Disp: , Rfl:  .  pravastatin (PRAVACHOL) 20 MG tablet, Take 20 mg by mouth daily with supper. , Disp: , Rfl:  .  pregabalin (LYRICA) 75 MG capsule, TAKE 1 CAPSULE BY MOUTH TWICE A DAY, Disp: 60 capsule, Rfl: 2 .  triamcinolone (KENALOG) 0.025 % cream, Apply 1 application topically daily as needed (FOR EZCEMA)., Disp: 30 g, Rfl: 2 .  Docusate Sodium (COLACE PO), Take 1 Dose by mouth as needed. (Patient not taking: No sig reported), Disp: , Rfl:  .  lidocaine-prilocaine (EMLA) cream, Place a small amount over cream over port 1 hour before each treatment, cover cream with saran wrap for clothing protection (Patient not taking: Reported on 10/23/2020), Disp: 30 g, Rfl: 1 .  ondansetron (ZOFRAN) 8 MG tablet, Take 1 tablet (8 mg total) by mouth every 8 (eight) hours as needed for nausea or vomiting. (Patient not taking: Reported on 10/23/2020), Disp: 20 tablet, Rfl: 0 .  ONETOUCH VERIO test strip, CHECK FASTING BLOOD GLUCOSE ONCE DAILY (Patient not taking: Reported on 10/23/2020), Disp: , Rfl:  No current facility-administered medications for this visit.  Facility-Administered Medications Ordered in Other Visits:  .  0.9 %  sodium chloride infusion, , Intravenous, Continuous, Sindy Guadeloupe, MD, Stopped at 07/27/18 1037 .  bevacizumab-awwb (MVASI) 400 mg in sodium chloride 0.9 % 100 mL chemo infusion, 5 mg/kg (Treatment Plan Recorded), Intravenous, Once, Sindy Guadeloupe, MD .  fluorouracil (ADRUCIL) 4,800 mg in sodium chloride 0.9 % 54 mL chemo infusion, 2,400 mg/m2 (Treatment Plan Recorded), Intravenous, 1 day or 1 dose, Sindy Guadeloupe, MD .  fluorouracil (ADRUCIL) chemo injection 800 mg, 400 mg/m2 (Treatment Plan Recorded), Intravenous, Once, Sindy Guadeloupe, MD .  heparin lock flush 100 unit/mL, 500 Units, Intravenous, Once,  Sindy Guadeloupe, MD .  irinotecan (CAMPTOSAR) 300 mg in dextrose 5 % 500 mL chemo infusion, 150 mg/m2 (Treatment Plan Recorded), Intravenous, Once, Sindy Guadeloupe, MD .  leucovorin 800 mg in dextrose 5 % 250 mL infusion, 402 mg/m2 (Treatment Plan Recorded), Intravenous, Once, Sindy Guadeloupe, MD .  sodium chloride flush (NS) 0.9 % injection 10 mL, 10 mL, Intravenous, PRN, Sindy Guadeloupe, MD, 10 mL at 07/27/18 0920 .  sodium chloride flush (NS) 0.9 % injection 10 mL, 10 mL, Intravenous, Once, Randa Evens C, MD .  sodium chloride flush (NS) 0.9 % injection 10 mL, 10 mL, Intravenous, PRN, Sindy Guadeloupe, MD, 10 mL at 10/23/20 0815  Physical exam:  Vitals:   10/23/20 0855  BP: (!) 159/61  Pulse: 70  Resp: 16  Temp: 98.2 F (36.8 C)  TempSrc: Oral  Weight: 181 lb  14.4 oz (82.5 kg)  Height: '5\' 6"'  (1.676 m)   Physical Exam Constitutional:      General: He is not in acute distress. Eyes:     Extraocular Movements: EOM normal.  Pulmonary:     Effort: Pulmonary effort is normal.  Skin:    General: Skin is warm and dry.  Neurological:     Mental Status: He is alert and oriented to person, place, and time.      CMP Latest Ref Rng & Units 10/23/2020  Glucose 70 - 99 mg/dL 163(H)  BUN 8 - 23 mg/dL 18  Creatinine 0.61 - 1.24 mg/dL 0.92  Sodium 135 - 145 mmol/L 134(L)  Potassium 3.5 - 5.1 mmol/L 4.3  Chloride 98 - 111 mmol/L 100  CO2 22 - 32 mmol/L 19(L)  Calcium 8.9 - 10.3 mg/dL 9.2  Total Protein 6.5 - 8.1 g/dL 7.4  Total Bilirubin 0.3 - 1.2 mg/dL 0.7  Alkaline Phos 38 - 126 U/L 78  AST 15 - 41 U/L 41  ALT 0 - 44 U/L 28   CBC Latest Ref Rng & Units 10/23/2020  WBC 4.0 - 10.5 K/uL 6.1  Hemoglobin 13.0 - 17.0 g/dL 10.9(L)  Hematocrit 39.0 - 52.0 % 35.7(L)  Platelets 150 - 400 K/uL 167      Assessment and plan- Patient is a 79 y.o. male with stage IV colon cancer and lung metastases. He has had progression on FOLFOXZirabevchemotherapy.  He is here for on treatment assessment  prior to cycle 22 of FOLFIRI Zirabev chemotherapy  Counts okay to proceed with cycle 22 of FOLFIRI Zirabev chemotherapy today.  His hemoglobin is improved to 10.9 after a break of 7 weeks since his last chemotherapy.  He will come on day 3 for pump disconnect and receive urine either.  I will see him back in 3 weeks for cycle 23.  I will plan to repeat scans after 24 cycles.  Patient was asking about his Covid booster.  He did have a syncopal episode after his second dose of milrinone which was self-limited.  Following 2 doses he also was tested positive for Covid although he had an asymptomatic course for the most part and received monoclonal antibody infusion.  He will therefore wait until January before considering a booster and I suggested that perhaps he could try [place] as a booster instead of moderna but it would be difficult to predict whether he will have any untoward side effects or not.  Chemo-induced peripheral neuropathy: Continue Lyrica and patient will be referred for acupuncture   Visit Diagnosis 1. Malignant neoplasm metastatic to lung, unspecified laterality ([place])   2. Encounter for antineoplastic chemotherapy   3. Encounter for monoclonal antibody treatment for malignancy   4. Chemotherapy-induced peripheral neuropathy ([place])   5. Antineoplastic chemotherapy induced anemia      Dr. Randa Evens, MD, MPH [facility] [phone] 10/23/2020 10:03 AM

## 2020-10-23 NOTE — Progress Notes (Signed)
Patient tolerated infusion well. Discharged home.  

## 2020-10-24 LAB — CEA: CEA: 4.1 ng/mL (ref 0.0–4.7)

## 2020-10-25 ENCOUNTER — Inpatient Hospital Stay: Payer: Medicare Other

## 2020-10-25 VITALS — BP 138/55 | HR 66 | Temp 98.0°F | Resp 19

## 2020-10-25 DIAGNOSIS — C189 Malignant neoplasm of colon, unspecified: Secondary | ICD-10-CM

## 2020-10-25 DIAGNOSIS — C7800 Secondary malignant neoplasm of unspecified lung: Secondary | ICD-10-CM

## 2020-10-25 DIAGNOSIS — Z5111 Encounter for antineoplastic chemotherapy: Secondary | ICD-10-CM | POA: Diagnosis not present

## 2020-10-25 MED ORDER — PEGFILGRASTIM-CBQV 6 MG/0.6ML ~~LOC~~ SOSY
6.0000 mg | PREFILLED_SYRINGE | Freq: Once | SUBCUTANEOUS | Status: AC
  Administered 2020-10-25: 12:00:00 6 mg via SUBCUTANEOUS
  Filled 2020-10-25: qty 0.6

## 2020-10-25 MED ORDER — SODIUM CHLORIDE 0.9% FLUSH
10.0000 mL | INTRAVENOUS | Status: DC | PRN
  Administered 2020-10-25: 12:00:00 10 mL
  Filled 2020-10-25: qty 10

## 2020-10-25 MED ORDER — HEPARIN SOD (PORK) LOCK FLUSH 100 UNIT/ML IV SOLN
INTRAVENOUS | Status: AC
  Filled 2020-10-25: qty 5

## 2020-10-25 MED ORDER — HEPARIN SOD (PORK) LOCK FLUSH 100 UNIT/ML IV SOLN
500.0000 [IU] | Freq: Once | INTRAVENOUS | Status: AC | PRN
  Administered 2020-10-25: 12:00:00 500 [IU]
  Filled 2020-10-25: qty 5

## 2020-10-25 NOTE — Progress Notes (Signed)
Pt stable at discharge.  

## 2020-11-13 ENCOUNTER — Inpatient Hospital Stay: Payer: Medicare Other

## 2020-11-13 ENCOUNTER — Inpatient Hospital Stay (HOSPITAL_BASED_OUTPATIENT_CLINIC_OR_DEPARTMENT_OTHER): Payer: Medicare Other | Admitting: Oncology

## 2020-11-13 ENCOUNTER — Inpatient Hospital Stay: Payer: Medicare Other | Attending: Oncology

## 2020-11-13 ENCOUNTER — Encounter: Payer: Self-pay | Admitting: Oncology

## 2020-11-13 VITALS — BP 158/66 | HR 76 | Temp 97.6°F | Resp 18 | Wt 181.6 lb

## 2020-11-13 DIAGNOSIS — R7989 Other specified abnormal findings of blood chemistry: Secondary | ICD-10-CM

## 2020-11-13 DIAGNOSIS — C189 Malignant neoplasm of colon, unspecified: Secondary | ICD-10-CM

## 2020-11-13 DIAGNOSIS — D6481 Anemia due to antineoplastic chemotherapy: Secondary | ICD-10-CM

## 2020-11-13 DIAGNOSIS — Z5189 Encounter for other specified aftercare: Secondary | ICD-10-CM | POA: Insufficient documentation

## 2020-11-13 DIAGNOSIS — R945 Abnormal results of liver function studies: Secondary | ICD-10-CM | POA: Diagnosis not present

## 2020-11-13 DIAGNOSIS — Z5111 Encounter for antineoplastic chemotherapy: Secondary | ICD-10-CM

## 2020-11-13 DIAGNOSIS — G620 Drug-induced polyneuropathy: Secondary | ICD-10-CM

## 2020-11-13 DIAGNOSIS — C187 Malignant neoplasm of sigmoid colon: Secondary | ICD-10-CM | POA: Diagnosis present

## 2020-11-13 DIAGNOSIS — Z5112 Encounter for antineoplastic immunotherapy: Secondary | ICD-10-CM | POA: Diagnosis not present

## 2020-11-13 DIAGNOSIS — C7800 Secondary malignant neoplasm of unspecified lung: Secondary | ICD-10-CM | POA: Diagnosis not present

## 2020-11-13 DIAGNOSIS — T451X5A Adverse effect of antineoplastic and immunosuppressive drugs, initial encounter: Secondary | ICD-10-CM

## 2020-11-13 LAB — CBC WITH DIFFERENTIAL/PLATELET
Abs Immature Granulocytes: 0.03 10*3/uL (ref 0.00–0.07)
Basophils Absolute: 0.1 10*3/uL (ref 0.0–0.1)
Basophils Relative: 1 %
Eosinophils Absolute: 0.1 10*3/uL (ref 0.0–0.5)
Eosinophils Relative: 1 %
HCT: 34.1 % — ABNORMAL LOW (ref 39.0–52.0)
Hemoglobin: 10.6 g/dL — ABNORMAL LOW (ref 13.0–17.0)
Immature Granulocytes: 0 %
Lymphocytes Relative: 22 %
Lymphs Abs: 1.8 10*3/uL (ref 0.7–4.0)
MCH: 27.2 pg (ref 26.0–34.0)
MCHC: 31.1 g/dL (ref 30.0–36.0)
MCV: 87.4 fL (ref 80.0–100.0)
Monocytes Absolute: 0.6 10*3/uL (ref 0.1–1.0)
Monocytes Relative: 8 %
Neutro Abs: 5.7 10*3/uL (ref 1.7–7.7)
Neutrophils Relative %: 68 %
Platelets: 214 10*3/uL (ref 150–400)
RBC: 3.9 MIL/uL — ABNORMAL LOW (ref 4.22–5.81)
RDW: 18 % — ABNORMAL HIGH (ref 11.5–15.5)
WBC: 8.4 10*3/uL (ref 4.0–10.5)
nRBC: 0 % (ref 0.0–0.2)

## 2020-11-13 LAB — COMPREHENSIVE METABOLIC PANEL
ALT: 41 U/L (ref 0–44)
AST: 69 U/L — ABNORMAL HIGH (ref 15–41)
Albumin: 4.3 g/dL (ref 3.5–5.0)
Alkaline Phosphatase: 103 U/L (ref 38–126)
Anion gap: 13 (ref 5–15)
BUN: 17 mg/dL (ref 8–23)
CO2: 20 mmol/L — ABNORMAL LOW (ref 22–32)
Calcium: 9.1 mg/dL (ref 8.9–10.3)
Chloride: 102 mmol/L (ref 98–111)
Creatinine, Ser: 0.93 mg/dL (ref 0.61–1.24)
GFR, Estimated: >60 mL/min (ref >60)
Glucose, Bld: 197 mg/dL — ABNORMAL HIGH (ref 70–99)
Potassium: 4.7 mmol/L (ref 3.5–5.1)
Sodium: 135 mmol/L (ref 135–145)
Total Bilirubin: 0.5 mg/dL (ref 0.3–1.2)
Total Protein: 7 g/dL (ref 6.5–8.1)

## 2020-11-13 LAB — PROTEIN, URINE, RANDOM: Total Protein, Urine: 20 mg/dL

## 2020-11-13 MED ORDER — SODIUM CHLORIDE 0.9 % IV SOLN
Freq: Once | INTRAVENOUS | Status: AC
  Filled 2020-11-13: qty 250

## 2020-11-13 MED ORDER — PALONOSETRON HCL INJECTION 0.25 MG/5ML
0.2500 mg | Freq: Once | INTRAVENOUS | Status: AC
  Administered 2020-11-13: 0.25 mg via INTRAVENOUS
  Filled 2020-11-13: qty 5

## 2020-11-13 MED ORDER — IRINOTECAN HCL CHEMO INJECTION 100 MG/5ML
150.0000 mg/m2 | Freq: Once | INTRAVENOUS | Status: AC
  Administered 2020-11-13: 300 mg via INTRAVENOUS
  Filled 2020-11-13: qty 15

## 2020-11-13 MED ORDER — LEUCOVORIN CALCIUM INJECTION 350 MG
402.0000 mg/m2 | Freq: Once | INTRAVENOUS | Status: AC
  Administered 2020-11-13: 800 mg via INTRAVENOUS
  Filled 2020-11-13: qty 35

## 2020-11-13 MED ORDER — FLUOROURACIL CHEMO INJECTION 2.5 GM/50ML
400.0000 mg/m2 | Freq: Once | INTRAVENOUS | Status: AC
  Administered 2020-11-13: 800 mg via INTRAVENOUS
  Filled 2020-11-13: qty 16

## 2020-11-13 MED ORDER — ATROPINE SULFATE 1 MG/ML IJ SOLN
0.5000 mg | Freq: Once | INTRAMUSCULAR | Status: AC | PRN
  Administered 2020-11-13: 0.5 mg via INTRAVENOUS
  Filled 2020-11-13: qty 1

## 2020-11-13 MED ORDER — SODIUM CHLORIDE 0.9 % IV SOLN
2400.0000 mg/m2 | INTRAVENOUS | Status: DC
  Administered 2020-11-13: 4800 mg via INTRAVENOUS
  Filled 2020-11-13: qty 96

## 2020-11-13 MED ORDER — SODIUM CHLORIDE 0.9% FLUSH
10.0000 mL | INTRAVENOUS | Status: DC | PRN
  Administered 2020-11-13: 10 mL
  Filled 2020-11-13: qty 10

## 2020-11-13 MED ORDER — DEXAMETHASONE SODIUM PHOSPHATE 10 MG/ML IJ SOLN
10.0000 mg | Freq: Once | INTRAMUSCULAR | Status: AC
  Administered 2020-11-13: 10 mg via INTRAVENOUS
  Filled 2020-11-13: qty 1

## 2020-11-13 MED ORDER — SODIUM CHLORIDE 0.9 % IV SOLN
5.0000 mg/kg | Freq: Once | INTRAVENOUS | Status: AC
  Administered 2020-11-13: 400 mg via INTRAVENOUS
  Filled 2020-11-13: qty 16

## 2020-11-13 MED ORDER — TRIAMCINOLONE ACETONIDE 0.025 % EX CREA: 1.0000 "application " | TOPICAL_CREAM | Freq: Every day | CUTANEOUS | 2 refills | Status: DC | PRN

## 2020-11-13 NOTE — Progress Notes (Signed)
FOLFIRI/Mvasi well tolerated. Discharged home in stable condition. Return Thursday for 5FU pump d/c.

## 2020-11-13 NOTE — Progress Notes (Signed)
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 At that time he had progressive abdominal distention and no bowel movement for about one week. CT abdomen showed an apple core lesion in the descending/sigmoid colon.  2. Patient underwent Hartmann's procedure for obstructing sigmoid colon mass on 02/06/2017. Preoperative CEA was 5.0  3. 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 It is 0 out of 15 lymph nodes were positive for malignancy. Perineural and lymphovascular invasion was present. Margins were negative.pT3N0. MMR stable.  4. Given that he had high risk stage II colon cancer including he presented with obstruction and has LVI and perineural invasion- adjuvant xeloda chemotherapy was recommended for 6 months.   5.Patient had evidence of hand foot syndrome after cycle 3. Cycle 4 delayed by 1 week. Topical urea cream prescribed  6. Patient completed 8 cycles of adjuvant xeloda in October 2018. polps seen in sigmoid and decending colon but negative for malignancy  7. Repeat Ct abdomen after 8 cycles showed no metastatic disease. 6 mm lung nodule noted in RLL  8.Patient had a repeat colonoscopy in October 2018 which showed polyps in his transverse  and descending colon which were taken out and were negative for high-grade dysplasia or malignancy. Patient subsequently underwent colostomy takedown procedure by Dr. Burt Knack  9. Repeat CT chest abdomen/ pelvis showed increase in the size of previously seen lung nodules largest being 1.4 cm consistent with metastatic disease. PET/CT showed mild uptake in 2 lung nodules. Others were not hypermetabolic. No other evidence of metastatic disease  10. Repeat lung biopsy consistent with colon adenocarcinoma. Comprehensive RAS panel testing showed mutant KRAS  11.Given the slow rate of growth of his colon cancer and desire to maintain his quality of life plan was to start chemotherapy after he gets a 39-monthbreak in summer. Repeat CT chest abdomen and pelvis in August 2019 showed mild increase in the size of his lung nodules. No new lung nodules are no new sites of distant metastatic disease.FOLFOX Avastin chemotherapy started on 06/22/2018.Patient completed 12 cycles of FOLFOX Avastin chemotherapy with continued response to his lung lesions. Oxaliplatin was dropped and patient is currently on 5-FU Avastin.He did not have mild progression of her disease in lung nodules and was switched to second line FOLFIRI Mvasi  12. NGSshowed no actionable mutations. PD-L1 less than 1%.No evidence of HER2 and NTRKmutations  Interval history-he has eczematous lesions around his trunk for which she has been using topical steroids with relief.  He did go for acupuncture for his neuropathy which helped him initially after 2 sessions but did not help him subsequently.  ECOG PS-1 Pain scale- 3 Opioid associated constipation- no  Review of systems- Review of Systems  Constitutional: Positive for malaise/fatigue. Negative for chills, fever and weight loss.  HENT: Negative for congestion, ear discharge and nosebleeds.   Eyes: Negative for blurred vision.  Respiratory: Negative for cough, hemoptysis, sputum  production, shortness of breath and wheezing.   Cardiovascular: Negative for chest pain, palpitations, orthopnea and claudication.  Gastrointestinal: Negative for abdominal pain, blood in stool, constipation, diarrhea, heartburn, melena, nausea and vomiting.  Genitourinary: Negative for dysuria, flank pain, frequency, hematuria and urgency.  Musculoskeletal: Negative for back pain, joint pain and myalgias.  Skin: Positive for rash.  Neurological: Positive for sensory change (Peripheral neuropathy). Negative for dizziness, tingling, focal weakness, seizures, weakness and headaches.  Endo/Heme/Allergies: Does not bruise/bleed easily.  Psychiatric/Behavioral: Negative for depression and suicidal ideas. The patient does not have insomnia.        No Known Allergies   Past Medical History:  Diagnosis Date  . Cancer ([place])    skin cancer-nose w graft,also shoulder- basal cell per pt  . Cataract    r eye  . Chronic kidney disease    kidney stones 20 y ago per pt  . Colon cancer ([place]) 2018   Surgical resection and chemo tx's.  . Diabetes mellitus without complication ([place])   . Eczema   . History of kidney stones   . Hyperlipidemia   . Hypertension   . Vertigo      Past Surgical History:  Procedure Laterality Date  . CATARACT EXTRACTION Left   . COLECTOMY WITH COLOSTOMY CREATION/HARTMANN PROCEDURE N/A 02/06/2017   Procedure: COLECTOMY WITH COLOSTOMY CREATION/HARTMANN PROCEDURE;  Surgeon: [name], MD;  Location: [facility] ORS;  Service: General;  Laterality: N/A;  . COLON SURGERY    . COLONOSCOPY WITH PROPOFOL N/A 09/04/2017   Procedure: COLONOSCOPY WITH PROPOFOL;  Surgeon: Jonathon Bellows, MD;  Location: [facility] ENDOSCOPY;  Service: Gastroenterology;  Laterality: N/A;  . COLOSTOMY CLOSURE N/A 10/20/2017   Procedure: COLOSTOMY CLOSURE;  Surgeon: [name], MD;  Location: [facility] ORS;  Service: General;  Laterality: N/A;  . CYSTOSCOPY    . FLEXIBLE SIGMOIDOSCOPY N/A 02/06/2017    Procedure: FLEXIBLE SIGMOIDOSCOPY;  Surgeon: Christene Lye, MD;  Location: [facility] ENDOSCOPY;  Service: Endoscopy;  Laterality: N/A;  . PORTACATH PLACEMENT N/A 06/17/2018   Procedure: INSERTION PORT-A-CATH, WITH FLUOROSCOPY;  Surgeon: [name], MD;  Location: [facility] ORS;  Service: General;  Laterality: N/A;    Social History   Socioeconomic History  . Marital status: Married    Spouse name: Not on file  . Number of children: Not on file  . Years of education: Not on file  . Highest education level: Not on file  Occupational History  . Not on file  Tobacco Use  . Smoking status: Never Smoker  . Smokeless tobacco: Never Used  Vaping Use  . Vaping Use: Never used  Substance and Sexual Activity  . Alcohol use: No  . Drug use: No  . Sexual activity: Not Currently  Other Topics Concern  . Not on file  Social History Narrative  . Not on file   Social Determinants of Health   Financial Resource Strain: Not on file  Food Insecurity: Not on file  Transportation Needs: Not on file  Physical Activity: Not on file  Stress: Not on file  Social Connections: Not on file  Intimate Partner Violence: Not on file    Family History  Problem Relation Age of Onset  . Diabetes Mother   . AAA (abdominal aortic aneurysm) Mother   . Coronary artery disease Mother   . Diabetes Father   . Coronary artery disease Father   . Dementia Father   . Breast cancer Sister  Current Outpatient Medications:  .  acetaminophen (TYLENOL) 500 MG tablet, Take 1,000 mg by mouth in the morning and at bedtime., Disp: , Rfl:  .  aspirin EC 81 MG tablet, Take 81 mg by mouth daily., Disp: , Rfl:  .  Docusate Sodium (COLACE PO), Take 1 Dose by mouth as needed. (Patient not taking: No sig reported), Disp: , Rfl:  .  Dulaglutide 0.75 MG/0.5ML SOPN, Inject 1.5 mg into the skin once a week. , Disp: , Rfl:  .  glimepiride (AMARYL) 2 MG tablet, Take 2 mg by mouth 2 (two) times daily before a meal. ,  Disp: , Rfl:  .  lidocaine-prilocaine (EMLA) cream, Place a small amount over cream over port 1 hour before each treatment, cover cream with saran wrap for clothing protection (Patient not taking: Reported on 10/23/2020), Disp: 30 g, Rfl: 1 .  loratadine (CLARITIN) 10 MG tablet, Take 10 mg by mouth daily., Disp: , Rfl:  .  losartan (COZAAR) 25 MG tablet, Take 25 mg by mouth daily., Disp: , Rfl:  .  metFORMIN (GLUCOPHAGE) 1000 MG tablet, Take 1,000-1,500 tablets by mouth See admin instructions. TAKE 1 TABLET (1000 MG) BY MOUTH IN THE MORNING & TAKE 1.5 TABLETS (1500 MG) BY MOUTH AT SUPPER, Disp: , Rfl:  .  ondansetron (ZOFRAN) 8 MG tablet, Take 1 tablet (8 mg total) by mouth every 8 (eight) hours as needed for nausea or vomiting. (Patient not taking: Reported on 10/23/2020), Disp: 20 tablet, Rfl: 0 .  ONETOUCH VERIO test strip, CHECK FASTING BLOOD GLUCOSE ONCE DAILY (Patient not taking: Reported on 10/23/2020), Disp: , Rfl:  .  pravastatin (PRAVACHOL) 20 MG tablet, Take 20 mg by mouth daily with supper. , Disp: , Rfl:  .  pregabalin (LYRICA) 75 MG capsule, TAKE 1 CAPSULE BY MOUTH TWICE A DAY, Disp: 60 capsule, Rfl: 2 .  triamcinolone (KENALOG) 0.025 % cream, Apply 1 application topically daily as needed (FOR EZCEMA)., Disp: 30 g, Rfl: 2 No current facility-administered medications for this visit.  Facility-Administered Medications Ordered in Other Visits:  .  0.9 %  sodium chloride infusion, , Intravenous, Continuous, Sindy Guadeloupe, MD, Stopped at 07/27/18 1037 .  heparin lock flush 100 unit/mL, 500 Units, Intravenous, Once, Sindy Guadeloupe, MD .  sodium chloride flush (NS) 0.9 % injection 10 mL, 10 mL, Intravenous, PRN, Sindy Guadeloupe, MD, 10 mL at 07/27/18 0920 .  sodium chloride flush (NS) 0.9 % injection 10 mL, 10 mL, Intravenous, Once, Sindy Guadeloupe, MD  Physical exam:  Vitals:   11/13/20 0913  BP: (!) 158/66  Pulse: 76  Resp: 18  Temp: 97.6 F (36.4 C)  TempSrc: Tympanic  SpO2: 97%   Weight: 181 lb 9.6 oz (82.4 kg)   Physical Exam Eyes:     Extraocular Movements: EOM normal.  Cardiovascular:     Rate and Rhythm: Normal rate and regular rhythm.     Heart sounds: Normal heart sounds.  Pulmonary:     Effort: Pulmonary effort is normal.     Breath sounds: Normal breath sounds.  Abdominal:     General: Bowel sounds are normal.     Palpations: Abdomen is soft.  Skin:    Comments: Scattered erythematous lesions noted over the lateral aspect of the chest wall and anterior abdominal bone  Neurological:     Mental Status: He is alert and oriented to person, place, and time.      CMP Latest Ref Rng & Units 11/13/2020  Glucose 70 - 99 mg/dL 197(H)  BUN 8 - 23 mg/dL 17  Creatinine 0.61 - 1.24 mg/dL 0.93  Sodium 135 - 145 mmol/L 135  Potassium 3.5 - 5.1 mmol/L 4.7  Chloride 98 - 111 mmol/L 102  CO2 22 - 32 mmol/L 20(L)  Calcium 8.9 - 10.3 mg/dL 9.1  Total Protein 6.5 - 8.1 g/dL 7.0  Total Bilirubin 0.3 - 1.2 mg/dL 0.5  Alkaline Phos 38 - 126 U/L 103  AST 15 - 41 U/L 69(H)  ALT 0 - 44 U/L 41   CBC Latest Ref Rng & Units 11/13/2020  WBC 4.0 - 10.5 K/uL 8.4  Hemoglobin 13.0 - 17.0 g/dL 10.6(L)  Hematocrit 39.0 - 52.0 % 34.1(L)  Platelets 150 - 400 K/uL 214      Assessment and plan- Patient is a 80 y.o. male with stage IV colon cancer and lung metastases. He has had progression on FOLFOXZirabevchemotherapy.  He is here for on treatment assessment prior to cycle 23 of FOLFIRI Zirabev chemotherapy  Counts okay to proceed with cycle 23 of FOLFIRI Zirabev chemotherapy today.  I will see him back in 3 weeks for cycle 24.  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  Abnormal LFTs: AST mildly elevated at 69.  Continue to monitor  Eczematous lesions: Continue topical steroids  I have encouraged patient to go ahead and take his Covid booster.  He would like to opt for [place]  deficiency had problems with motor nerve   Visit Diagnosis 1. Encounter for antineoplastic chemotherapy   2. Encounter for monoclonal antibody treatment for malignancy   3. Abnormal LFTs   4. 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[place])   5. Malignant neoplasm metastatic to lung, unspecified laterality ([place])   6. Chemotherapy-induced peripheral neuropathy ([place])   7. Antineoplastic chemotherapy induced anemia      Dr. Randa Evens, MD, MPH [facility] [phone] 11/13/2020 9:48 AM

## 2020-11-13 NOTE — Addendum Note (Signed)
Addended by: Kern Alberta on: 11/13/2020 10:28 AM   Modules accepted: Orders

## 2020-11-14 LAB — CEA: CEA: 4.7 ng/mL (ref 0.0–4.7)

## 2020-11-15 ENCOUNTER — Inpatient Hospital Stay: Payer: Medicare Other

## 2020-11-15 DIAGNOSIS — Z5111 Encounter for antineoplastic chemotherapy: Secondary | ICD-10-CM | POA: Diagnosis not present

## 2020-11-15 DIAGNOSIS — C7800 Secondary malignant neoplasm of unspecified lung: Secondary | ICD-10-CM

## 2020-11-15 DIAGNOSIS — C189 Malignant neoplasm of colon, unspecified: Secondary | ICD-10-CM

## 2020-11-15 MED ORDER — HEPARIN SOD (PORK) LOCK FLUSH 100 UNIT/ML IV SOLN
INTRAVENOUS | Status: AC
  Filled 2020-11-15: qty 5

## 2020-11-15 MED ORDER — PEGFILGRASTIM-CBQV 6 MG/0.6ML ~~LOC~~ SOSY
6.0000 mg | PREFILLED_SYRINGE | Freq: Once | SUBCUTANEOUS | Status: AC
  Administered 2020-11-15: 6 mg via SUBCUTANEOUS
  Filled 2020-11-15: qty 0.6

## 2020-11-15 MED ORDER — SODIUM CHLORIDE 0.9% FLUSH
10.0000 mL | INTRAVENOUS | Status: DC | PRN
  Administered 2020-11-15: 10 mL
  Filled 2020-11-15: qty 10

## 2020-11-15 MED ORDER — HEPARIN SOD (PORK) LOCK FLUSH 100 UNIT/ML IV SOLN
500.0000 [IU] | Freq: Once | INTRAVENOUS | Status: AC | PRN
  Administered 2020-11-15: 500 [IU]
  Filled 2020-11-15: qty 5

## 2020-11-26 ENCOUNTER — Ambulatory Visit: Payer: Medicare Other

## 2020-11-28 ENCOUNTER — Ambulatory Visit: Payer: Medicare Other

## 2020-11-28 ENCOUNTER — Other Ambulatory Visit: Payer: Self-pay

## 2020-11-28 ENCOUNTER — Ambulatory Visit
Admission: RE | Admit: 2020-11-28 | Discharge: 2020-11-28 | Disposition: A | Discharge status: Home / Self Care | Payer: Medicare Other | Source: Ambulatory Visit | Attending: Oncology | Admitting: Oncology

## 2020-11-28 DIAGNOSIS — C7800 Secondary malignant neoplasm of unspecified lung: Secondary | ICD-10-CM | POA: Diagnosis present

## 2020-11-28 DIAGNOSIS — I700 Atherosclerosis of aorta: Secondary | ICD-10-CM | POA: Insufficient documentation

## 2020-11-28 DIAGNOSIS — K8020 Calculus of gallbladder without cholecystitis without obstruction: Secondary | ICD-10-CM | POA: Insufficient documentation

## 2020-11-28 DIAGNOSIS — Z5111 Encounter for antineoplastic chemotherapy: Secondary | ICD-10-CM

## 2020-11-28 MED ORDER — IOHEXOL 300 MG/ML  SOLN
85.0000 mL | Freq: Once | INTRAMUSCULAR | Status: AC | PRN
  Administered 2020-11-28: 85 mL via INTRAVENOUS

## 2020-12-03 ENCOUNTER — Telehealth: Payer: Self-pay | Admitting: *Deleted

## 2020-12-03 NOTE — Telephone Encounter (Signed)
Patient called to report that he had a Covid exposure on Friday. He has not been tested , is afebrile and has a "hoarse throat" which he states is chronic. He wants to know if he should come for treatment tomorrow.

## 2020-12-03 NOTE — Telephone Encounter (Signed)
Move it out by 1 week his appointment. If patient is asymptomatic he can come without getting tested. If he has any new symptoms, he needs to test before he comes

## 2020-12-04 ENCOUNTER — Inpatient Hospital Stay: Payer: Medicare Other

## 2020-12-04 ENCOUNTER — Inpatient Hospital Stay: Payer: Medicare Other | Admitting: Oncology

## 2020-12-05 ENCOUNTER — Telehealth: Payer: Self-pay | Admitting: *Deleted

## 2020-12-05 NOTE — Telephone Encounter (Signed)
Sent message to Dr. [name] and she said that pt should get covid tested since he had called in on 1/24 with exposure to covid. I called pt back but there was no answer and his voicemail was full and could not leave a message. Waited to end of day and called him and I told him what rao had said. He said that his grand daughter was tested and was positive for covid through home test. He states that he can get his son a home test and bring it to him and he will let us know about results. He states he is having the same sx of his grand daughter. He started having the sx 2 days after his grand daughter had them. I told him that he can use lozenges to help. If he is achy or pain can use tylenol. If he has green sputum or fever she could call an antibiotic in. He only has hoarseness and drainage. He then asked about if his son can be on video on 2/2. I told him she can send text to both cell phones; however if he is positive for covid I would assume that his appt will be moved out. I told pt that I will check with [name] and let him know tom. He is agreeable to the plan

## 2020-12-05 NOTE — Telephone Encounter (Signed)
Pt called about what he can do fo rthe sx. He is having. I sent message to [name] and she states he needs to be covid tested since he is having sx. And he was exposed on 1/24 for possible covid. I tried calling him back but his voicemail is full and can't leave a message

## 2020-12-06 ENCOUNTER — Inpatient Hospital Stay: Payer: Medicare Other

## 2020-12-06 ENCOUNTER — Encounter: Payer: Self-pay | Admitting: *Deleted

## 2020-12-07 ENCOUNTER — Other Ambulatory Visit: Payer: Self-pay | Admitting: Nurse Practitioner

## 2020-12-07 ENCOUNTER — Telehealth: Payer: Self-pay | Admitting: *Deleted

## 2020-12-07 ENCOUNTER — Other Ambulatory Visit: Payer: Self-pay | Admitting: Physician Assistant

## 2020-12-07 ENCOUNTER — Ambulatory Visit (HOSPITAL_COMMUNITY)
Admission: RE | Admit: 2020-12-07 | Discharge: 2020-12-07 | Disposition: A | Discharge status: Home / Self Care | Payer: Medicare Other | Source: Ambulatory Visit | Attending: Pulmonary Disease | Admitting: Pulmonary Disease

## 2020-12-07 DIAGNOSIS — E119 Type 2 diabetes mellitus without complications: Secondary | ICD-10-CM | POA: Insufficient documentation

## 2020-12-07 DIAGNOSIS — U071 COVID-19: Secondary | ICD-10-CM | POA: Diagnosis present

## 2020-12-07 DIAGNOSIS — C189 Malignant neoplasm of colon, unspecified: Secondary | ICD-10-CM

## 2020-12-07 DIAGNOSIS — I10 Essential (primary) hypertension: Secondary | ICD-10-CM | POA: Diagnosis not present

## 2020-12-07 DIAGNOSIS — C7800 Secondary malignant neoplasm of unspecified lung: Secondary | ICD-10-CM

## 2020-12-07 MED ORDER — SOTROVIMAB 500 MG/8ML IV SOLN
500.0000 mg | Freq: Once | INTRAVENOUS | Status: AC
  Administered 2020-12-07: 500 mg via INTRAVENOUS

## 2020-12-07 MED ORDER — METHYLPREDNISOLONE SODIUM SUCC 125 MG IJ SOLR: 125.0000 mg | Freq: Once | INTRAMUSCULAR | Status: DC | PRN

## 2020-12-07 MED ORDER — ONDANSETRON HCL 4 MG/2ML IJ SOLN
4.0000 mg | Freq: Once | INTRAMUSCULAR | Status: AC
  Administered 2020-12-07: 4 mg via INTRAVENOUS
  Filled 2020-12-07: qty 2

## 2020-12-07 MED ORDER — EPINEPHRINE 0.3 MG/0.3ML IJ SOAJ: 0.3000 mg | Freq: Once | INTRAMUSCULAR | Status: DC | PRN

## 2020-12-07 MED ORDER — FAMOTIDINE IN NACL 20-0.9 MG/50ML-% IV SOLN: 20.0000 mg | Freq: Once | INTRAVENOUS | Status: DC | PRN

## 2020-12-07 MED ORDER — ALBUTEROL SULFATE HFA 108 (90 BASE) MCG/ACT IN AERS: 2.0000 | INHALATION_SPRAY | Freq: Once | RESPIRATORY_TRACT | Status: DC | PRN

## 2020-12-07 MED ORDER — DIPHENHYDRAMINE HCL 50 MG/ML IJ SOLN: 50.0000 mg | Freq: Once | INTRAMUSCULAR | Status: DC | PRN

## 2020-12-07 MED ORDER — SODIUM CHLORIDE 0.9 % IV SOLN: INTRAVENOUS | Status: DC | PRN

## 2020-12-07 NOTE — Progress Notes (Signed)
I connected by phone with Miguel Flores on 12/07/2020 at 10:51 AM to discuss the potential use of a new treatment for mild to moderate COVID-19 viral infection in non-hospitalized patients.  This patient is a 80 y.o. male that meets the FDA criteria for Emergency Use Authorization of COVID monoclonal antibody sotrovimab.  Has a (+) direct SARS-CoV-2 viral test result  Has mild or moderate COVID-19   Is NOT hospitalized due to COVID-19  Is within 10 days of symptom onset  Has at least one of the high risk factor(s) for progression to severe COVID-19 and/or hospitalization as defined in EUA.  Specific high risk criteria : Older age (>/= 81 yo), BMI > 25, Diabetes, Immunosuppressive Disease or Treatment and Cardiovascular disease or hypertension   I have spoken and communicated the following to the patient or parent/caregiver regarding COVID monoclonal antibody treatment:  1. FDA has authorized the emergency use for the treatment of mild to moderate COVID-19 in adults and pediatric patients with positive results of direct SARS-CoV-2 viral testing who are 44 years of age and older weighing at least 40 kg, and who are at high risk for progressing to severe COVID-19 and/or hospitalization.  2. The significant known and potential risks and benefits of COVID monoclonal antibody, and the extent to which such potential risks and benefits are unknown.  3. Information on available alternative treatments and the risks and benefits of those alternatives, including clinical trials.  4. Patients treated with COVID monoclonal antibody should continue to self-isolate and use infection control measures (e.g., wear mask, isolate, social distance, avoid sharing personal items, clean and disinfect "high touch" surfaces, and frequent handwashing) according to CDC guidelines.   5. The patient or parent/caregiver has the option to accept or refuse COVID monoclonal antibody treatment.  After reviewing this  information with the patient, the patient has agreed to receive one of the available covid 19 monoclonal antibodies and will be provided an appropriate fact sheet prior to infusion.   Sx onset 1/23. Fully vaccinated but immunocompromised. Set up for infusion today.   Angelena Form, PA-C 12/07/2020 10:51 AM

## 2020-12-07 NOTE — Telephone Encounter (Signed)
I called the pt and the pharmacy. Per pharmacy this is not covered by insurance. And the pharmacy that he uses does not have any in stock at this time. I told pt that he could ask one of his sons to go on [place] and get them to order a pulse oximeter. He is agreeable for that. Lauren Np had told pt to get pulse ox

## 2020-12-07 NOTE — Progress Notes (Signed)
Spoke to patient. Onset of covid symptoms was 1/23. Patient received pfizer booster on 11/22/20. Undergoing chemotherapy for colon cancer with lung metastases, ckd, diabetes, cad, and age 80. Referral sent to covid treatment team. Reviewed criteria for home monitoring. Encouraged him to get pulse oximeter and monitor readings at home.   Symptoms tier reviewed as well as criteria for ending isolation.  Symptoms reviewed that would warrant ED/Hospital evaluation. Preventative practices reviewed. Patient verbalized understanding. Patient advised to call back if he/she opts to proceed with infusion. Callback number provided. Urgent care and/or ER precautions given for severe symptoms.   Patient Active Problem List   Diagnosis Date Noted  . Goals of care, counseling/discussion 12/20/2018  . Lung metastases ([place]) 06/18/2018  . Colon cancer ([place]) 10/20/2017  . Pure hypercholesterolemia 08/14/2017  . Vaccine counseling 03/09/2017  . Essential hypertension 02/20/2017  . Mixed hyperlipidemia 02/20/2017  . Type 2 diabetes mellitus without complication, without long-term current use of insulin ([place]) 02/20/2017  . 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[place]) 02/20/2017  . Colonic mass   . Large bowel obstruction ([place]) 02/05/2017  . Senile purpura ([place]) 10/07/2016  . Chronic eczema 02/04/2016  . Normocytic anemia 02/04/2016   Beckey Rutter, NP

## 2020-12-07 NOTE — Telephone Encounter (Signed)
I called to see if I could get in touch with pt. About the r/s appts due to him having covid. I sent my chart message yest. But his voicemail is full and I can't leave a message. I did r/s the appt. 2/15 at 8:15 to have port labs , then see me and then get chemo.

## 2020-12-07 NOTE — Progress Notes (Signed)
Diagnosis: COVID-19  Physician: Dr. Patrick Wright  Procedure: Covid Infusion Clinic Med: Sotrovimab infusion - Provided patient with sotrovimab fact sheet for patients, parents, and caregivers prior to infusion.   Complications: No immediate complications noted  Discharge: Discharged home    

## 2020-12-07 NOTE — Telephone Encounter (Signed)
I have contacted the [facility]  know that we are sending email to [facility] transplant to see if they want to hold the revlimid. We will contact [employer] when we get the answer.

## 2020-12-07 NOTE — Discharge Instructions (Signed)

## 2020-12-07 NOTE — Telephone Encounter (Signed)
Patient called requesting a prescription foe an oximeter to be sent to [facility] on [address]

## 2020-12-07 NOTE — Progress Notes (Signed)
Patient reviewed Fact Sheet for Patients, Parents, and Caregivers for Emergency Use Authorization (EUA) of sotrovimab for the Treatment of Coronavirus. Patient also reviewed and is agreeable to the estimated cost of treatment. Patient is agreeable to proceed.   

## 2020-12-12 ENCOUNTER — Ambulatory Visit: Payer: Medicare Other

## 2020-12-12 ENCOUNTER — Telehealth: Payer: Medicare Other | Admitting: Oncology

## 2020-12-12 ENCOUNTER — Other Ambulatory Visit: Payer: Medicare Other

## 2020-12-23 ENCOUNTER — Other Ambulatory Visit: Payer: Self-pay | Admitting: *Deleted

## 2020-12-23 DIAGNOSIS — C189 Malignant neoplasm of colon, unspecified: Secondary | ICD-10-CM

## 2020-12-23 DIAGNOSIS — C7800 Secondary malignant neoplasm of unspecified lung: Secondary | ICD-10-CM

## 2020-12-25 ENCOUNTER — Inpatient Hospital Stay (HOSPITAL_BASED_OUTPATIENT_CLINIC_OR_DEPARTMENT_OTHER): Payer: Medicare Other | Admitting: Oncology

## 2020-12-25 ENCOUNTER — Encounter: Payer: Self-pay | Admitting: Oncology

## 2020-12-25 ENCOUNTER — Inpatient Hospital Stay: Payer: Medicare Other | Attending: Oncology

## 2020-12-25 ENCOUNTER — Inpatient Hospital Stay: Payer: Medicare Other

## 2020-12-25 VITALS — BP 144/66 | HR 71 | Temp 97.8°F | Resp 16 | Ht 66.0 in | Wt 176.1 lb

## 2020-12-25 DIAGNOSIS — Z5112 Encounter for antineoplastic immunotherapy: Secondary | ICD-10-CM | POA: Insufficient documentation

## 2020-12-25 DIAGNOSIS — C7800 Secondary malignant neoplasm of unspecified lung: Secondary | ICD-10-CM | POA: Insufficient documentation

## 2020-12-25 DIAGNOSIS — Z5189 Encounter for other specified aftercare: Secondary | ICD-10-CM | POA: Diagnosis not present

## 2020-12-25 DIAGNOSIS — C189 Malignant neoplasm of colon, unspecified: Secondary | ICD-10-CM

## 2020-12-25 DIAGNOSIS — T451X5A Adverse effect of antineoplastic and immunosuppressive drugs, initial encounter: Secondary | ICD-10-CM

## 2020-12-25 DIAGNOSIS — Z79899 Other long term (current) drug therapy: Secondary | ICD-10-CM | POA: Insufficient documentation

## 2020-12-25 DIAGNOSIS — C187 Malignant neoplasm of sigmoid colon: Secondary | ICD-10-CM | POA: Insufficient documentation

## 2020-12-25 DIAGNOSIS — D6481 Anemia due to antineoplastic chemotherapy: Secondary | ICD-10-CM | POA: Diagnosis not present

## 2020-12-25 DIAGNOSIS — Z5111 Encounter for antineoplastic chemotherapy: Secondary | ICD-10-CM

## 2020-12-25 DIAGNOSIS — G620 Drug-induced polyneuropathy: Secondary | ICD-10-CM

## 2020-12-25 LAB — COMPREHENSIVE METABOLIC PANEL
ALT: 36 U/L (ref 0–44)
AST: 63 U/L — ABNORMAL HIGH (ref 15–41)
Albumin: 4 g/dL (ref 3.5–5.0)
Alkaline Phosphatase: 81 U/L (ref 38–126)
Anion gap: 13 (ref 5–15)
BUN: 21 mg/dL (ref 8–23)
CO2: 19 mmol/L — ABNORMAL LOW (ref 22–32)
Calcium: 8.8 mg/dL — ABNORMAL LOW (ref 8.9–10.3)
Chloride: 106 mmol/L (ref 98–111)
Creatinine, Ser: 0.92 mg/dL (ref 0.61–1.24)
GFR, Estimated: >60 mL/min (ref >60)
Glucose, Bld: 193 mg/dL — ABNORMAL HIGH (ref 70–99)
Potassium: 4.1 mmol/L (ref 3.5–5.1)
Sodium: 138 mmol/L (ref 135–145)
Total Bilirubin: 0.6 mg/dL (ref 0.3–1.2)
Total Protein: 7.4 g/dL (ref 6.5–8.1)

## 2020-12-25 LAB — CBC WITH DIFFERENTIAL/PLATELET
Abs Immature Granulocytes: 0.01 10*3/uL (ref 0.00–0.07)
Basophils Absolute: 0.1 10*3/uL (ref 0.0–0.1)
Basophils Relative: 1 %
Eosinophils Absolute: 0.5 10*3/uL (ref 0.0–0.5)
Eosinophils Relative: 9 %
HCT: 33.2 % — ABNORMAL LOW (ref 39.0–52.0)
Hemoglobin: 10.2 g/dL — ABNORMAL LOW (ref 13.0–17.0)
Immature Granulocytes: 0 %
Lymphocytes Relative: 32 %
Lymphs Abs: 1.7 10*3/uL (ref 0.7–4.0)
MCH: 26.6 pg (ref 26.0–34.0)
MCHC: 30.7 g/dL (ref 30.0–36.0)
MCV: 86.7 fL (ref 80.0–100.0)
Monocytes Absolute: 0.5 10*3/uL (ref 0.1–1.0)
Monocytes Relative: 10 %
Neutro Abs: 2.5 10*3/uL (ref 1.7–7.7)
Neutrophils Relative %: 48 %
Platelets: 165 10*3/uL (ref 150–400)
RBC: 3.83 MIL/uL — ABNORMAL LOW (ref 4.22–5.81)
RDW: 18.6 % — ABNORMAL HIGH (ref 11.5–15.5)
WBC: 5.2 10*3/uL (ref 4.0–10.5)
nRBC: 0 % (ref 0.0–0.2)

## 2020-12-25 LAB — PROTEIN, URINE, RANDOM: Total Protein, Urine: 24 mg/dL

## 2020-12-25 MED ORDER — DEXAMETHASONE SODIUM PHOSPHATE 10 MG/ML IJ SOLN
10.0000 mg | Freq: Once | INTRAMUSCULAR | Status: AC
  Administered 2020-12-25: 10 mg via INTRAVENOUS
  Filled 2020-12-25: qty 1

## 2020-12-25 MED ORDER — PALONOSETRON HCL INJECTION 0.25 MG/5ML
0.2500 mg | Freq: Once | INTRAVENOUS | Status: AC
  Administered 2020-12-25: 0.25 mg via INTRAVENOUS
  Filled 2020-12-25: qty 5

## 2020-12-25 MED ORDER — SODIUM CHLORIDE 0.9 % IV SOLN
Freq: Once | INTRAVENOUS | Status: AC
  Filled 2020-12-25: qty 250

## 2020-12-25 MED ORDER — SODIUM CHLORIDE 0.9 % IV SOLN
2400.0000 mg/m2 | INTRAVENOUS | Status: DC
  Administered 2020-12-25: 4800 mg via INTRAVENOUS
  Filled 2020-12-25: qty 96

## 2020-12-25 MED ORDER — LEUCOVORIN CALCIUM INJECTION 350 MG
402.0000 mg/m2 | Freq: Once | INTRAVENOUS | Status: AC
  Administered 2020-12-25: 800 mg via INTRAVENOUS
  Filled 2020-12-25: qty 40

## 2020-12-25 MED ORDER — IRINOTECAN HCL CHEMO INJECTION 100 MG/5ML
150.0000 mg/m2 | Freq: Once | INTRAVENOUS | Status: AC
  Administered 2020-12-25: 300 mg via INTRAVENOUS
  Filled 2020-12-25: qty 15

## 2020-12-25 MED ORDER — ATROPINE SULFATE 1 MG/ML IJ SOLN
0.5000 mg | Freq: Once | INTRAMUSCULAR | Status: AC | PRN
  Administered 2020-12-25: 0.5 mg via INTRAVENOUS
  Filled 2020-12-25: qty 1

## 2020-12-25 MED ORDER — FLUOROURACIL CHEMO INJECTION 2.5 GM/50ML
400.0000 mg/m2 | Freq: Once | INTRAVENOUS | Status: AC
Start: 2020-12-25 — End: 2020-12-25
  Administered 2020-12-25: 800 mg via INTRAVENOUS
  Filled 2020-12-25: qty 16

## 2020-12-25 MED ORDER — SODIUM CHLORIDE 0.9 % IV SOLN
5.0000 mg/kg | Freq: Once | INTRAVENOUS | Status: AC
Start: 2020-12-25 — End: 2020-12-25
  Administered 2020-12-25: 400 mg via INTRAVENOUS
  Filled 2020-12-25: qty 16

## 2020-12-25 NOTE — Progress Notes (Signed)
Here to get ct scan results, numbness of toes seem worse with cold weather. Eczema is worse and he is getting meds from dermatologist.

## 2020-12-25 NOTE — Progress Notes (Signed)
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 At that time he had progressive abdominal distention and no bowel movement for about one week. CT abdomen showed an apple core lesion in the descending/sigmoid colon.  2. Patient underwent Hartmann's procedure for obstructing sigmoid colon mass on 02/06/2017. Preoperative CEA was 5.0  3. 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 It is 0 out of 15 lymph nodes were positive for malignancy. Perineural and lymphovascular invasion was present. Margins were negative.pT3N0. MMR stable.  4. Given that he had high risk stage II colon cancer including he presented with obstruction and has LVI and perineural invasion- adjuvant xeloda chemotherapy was recommended for 6 months.   5.Patient had evidence of hand foot syndrome after cycle 3. Cycle 4 delayed by 1 week. Topical urea cream prescribed  6. Patient completed 8 cycles of adjuvant xeloda in October 2018. polps seen in sigmoid and decending colon but negative for malignancy  7. Repeat Ct abdomen after 8 cycles showed no metastatic disease. 6 mm lung nodule noted in RLL  8.Patient had a repeat colonoscopy in October 2018 which showed polyps in his transverse  and descending colon which were taken out and were negative for high-grade dysplasia or malignancy. Patient subsequently underwent colostomy takedown procedure by Dr. Burt Knack  9. Repeat CT chest abdomen/ pelvis showed increase in the size of previously seen lung nodules largest being 1.4 cm consistent with metastatic disease. PET/CT showed mild uptake in 2 lung nodules. Others were not hypermetabolic. No other evidence of metastatic disease  10. Repeat lung biopsy consistent with colon adenocarcinoma. Comprehensive RAS panel testing showed mutant KRAS  11.Given the slow rate of growth of his colon cancer and desire to maintain his quality of life plan was to start chemotherapy after he gets a 81-monthbreak in summer. Repeat CT chest abdomen and pelvis in August 2019 showed mild increase in the size of his lung nodules. No new lung nodules are no new sites of distant metastatic disease.FOLFOX Avastin chemotherapy started on 06/22/2018.Patient completed 12 cycles of FOLFOX Avastin chemotherapy with continued response to his lung lesions. Oxaliplatin was dropped and patient is currently on 5-FU Avastin.He did not have mild progression of her disease in lung nodules and was switched to second line FOLFIRI Mvasi  12. NGSshowed no actionable mutations. PD-L1 less than 1%.No evidence of HER2 and NTRKmutations   Interval history- Patient reports doing well. Neuropathy is stable  ECOG PS- 1 Pain scale- 0 Opioid associated constipation- no  Review of systems- Review of Systems  Constitutional: Positive for malaise/fatigue. Negative for chills, fever and weight loss.  HENT: Negative for congestion, ear discharge and nosebleeds.   Eyes: Negative for blurred vision.  Respiratory: Negative for cough, hemoptysis, sputum production, shortness of breath and wheezing.   Cardiovascular: Negative for chest pain, palpitations, orthopnea and claudication.  Gastrointestinal: Negative for  abdominal  pain, blood in stool, constipation, diarrhea, heartburn, melena, nausea and vomiting.  Genitourinary: Negative for dysuria, flank pain, frequency, hematuria and urgency.  Musculoskeletal: Negative for back pain, joint pain and myalgias.  Skin: Negative for rash.  Neurological: Positive for sensory change (peripheral neuropathy). Negative for dizziness, tingling, focal weakness, seizures, weakness and headaches.  Endo/Heme/Allergies: Does not bruise/bleed easily.  Psychiatric/Behavioral: Negative for depression and suicidal ideas. The patient does not have insomnia.       No Known Allergies   Past Medical History:  Diagnosis Date  . Cancer ([place])    skin cancer-nose w graft,also shoulder- basal cell per pt  . Cataract    r eye  . Chronic kidney disease    kidney stones 20 y ago per pt  . Colon cancer ([place]) 2018   Surgical resection and chemo tx's.  . Diabetes mellitus without complication ([place])   . Eczema   . History of kidney stones   . Hyperlipidemia   . Hypertension   . Vertigo      Past Surgical History:  Procedure Laterality Date  . CATARACT EXTRACTION Left   . COLECTOMY WITH COLOSTOMY CREATION/HARTMANN PROCEDURE N/A 02/06/2017   Procedure: COLECTOMY WITH COLOSTOMY CREATION/HARTMANN PROCEDURE;  Surgeon: [name], MD;  Location: [facility] ORS;  Service: General;  Laterality: N/A;  . COLON SURGERY    . COLONOSCOPY WITH PROPOFOL N/A 09/04/2017   Procedure: COLONOSCOPY WITH PROPOFOL;  Surgeon: Jonathon Bellows, MD;  Location: [facility] ENDOSCOPY;  Service: Gastroenterology;  Laterality: N/A;  . COLOSTOMY CLOSURE N/A 10/20/2017   Procedure: COLOSTOMY CLOSURE;  Surgeon: [name], MD;  Location: [facility] ORS;  Service: General;  Laterality: N/A;  . CYSTOSCOPY    . FLEXIBLE SIGMOIDOSCOPY N/A 02/06/2017   Procedure: FLEXIBLE SIGMOIDOSCOPY;  Surgeon: Christene Lye, MD;  Location: [facility] ENDOSCOPY;  Service: Endoscopy;  Laterality: N/A;  . PORTACATH PLACEMENT N/A  06/17/2018   Procedure: INSERTION PORT-A-CATH, WITH FLUOROSCOPY;  Surgeon: [name], MD;  Location: [facility] ORS;  Service: General;  Laterality: N/A;    Social History   Socioeconomic History  . Marital status: Married    Spouse name: Not on file  . Number of children: Not on file  . Years of education: Not on file  . Highest education level: Not on file  Occupational History  . Not on file  Tobacco Use  . Smoking status: Never Smoker  . Smokeless tobacco: Never Used  Vaping Use  . Vaping Use: Never used  Substance and Sexual Activity  . Alcohol use: No  . Drug use: No  . Sexual activity: Not Currently  Other Topics Concern  . Not on file  Social History Narrative  . Not on file   Social Determinants of Health   Financial Resource Strain: Not on file  Food Insecurity: Not on file  Transportation Needs: Not on file  Physical Activity: Not on file  Stress: Not on file  Social Connections: Not on file  Intimate Partner Violence: Not on file    Family History  Problem Relation Age of Onset  . Diabetes Mother   . AAA (abdominal aortic aneurysm) Mother   . Coronary artery disease Mother   . Diabetes Father   . Coronary artery disease Father   . Dementia Father   . Breast cancer Sister      Current Outpatient Medications:  .  acetaminophen (TYLENOL) 500 MG tablet, Take 1,000 mg by mouth in the morning and at bedtime., Disp: , Rfl:  .  aspirin EC 81 MG tablet, Take 81 mg by mouth daily., Disp: , Rfl:  .  Dulaglutide 0.75 MG/0.5ML SOPN, Inject 1.5 mg into the skin once a week. , Disp: , Rfl:  .  glimepiride (AMARYL) 2 MG tablet, Take 2 mg by mouth 2 (two) times daily before a meal. , Disp: , Rfl:  .  loratadine (CLARITIN) 10 MG tablet, Take 10 mg by mouth daily., Disp: , Rfl:  .  losartan (COZAAR) 25 MG tablet, Take 25 mg by mouth daily., Disp: , Rfl:  .  metFORMIN (GLUCOPHAGE) 1000 MG tablet, Take 1,000-1,500 tablets by mouth See admin instructions. TAKE 1 TABLET  (1000 MG) BY MOUTH IN THE MORNING & TAKE 1.5 TABLETS (1500 MG) BY MOUTH AT SUPPER, Disp: , Rfl:  .  ONETOUCH VERIO test strip, , Disp: , Rfl:  .  pravastatin (PRAVACHOL) 20 MG tablet, Take 20 mg by mouth daily with supper. , Disp: , Rfl:  .  pregabalin (LYRICA) 75 MG capsule, TAKE 1 CAPSULE BY MOUTH TWICE A DAY, Disp: 60 capsule, Rfl: 2 .  triamcinolone (KENALOG) 0.025 % cream, Apply 1 application topically daily as needed (FOR EZCEMA)., Disp: 30 g, Rfl: 2 .  Docusate Sodium (COLACE PO), Take 1 Dose by mouth as needed. (Patient not taking: No sig reported), Disp: , Rfl:  .  lidocaine-prilocaine (EMLA) cream, Place a small amount over cream over port 1 hour before each treatment, cover cream with saran wrap for clothing protection (Patient not taking: No sig reported), Disp: 30 g, Rfl: 1 .  ondansetron (ZOFRAN) 8 MG tablet, Take 1 tablet (8 mg total) by mouth every 8 (eight) hours as needed for nausea or vomiting. (Patient not taking: No sig reported), Disp: 20 tablet, Rfl: 0 No current facility-administered medications for this visit.  Facility-Administered Medications Ordered in Other Visits:  .  0.9 %  sodium chloride infusion, , Intravenous, Continuous, Sindy Guadeloupe, MD, Stopped at 07/27/18 1037 .  atropine injection 0.5 mg, 0.5 mg, Intravenous, Once PRN, Sindy Guadeloupe, MD .  bevacizumab-awwb (MVASI) 400 mg in sodium chloride 0.9 % 100 mL chemo infusion, 5 mg/kg (Treatment Plan Recorded), Intravenous, Once, Sindy Guadeloupe, MD .  fluorouracil (ADRUCIL) 4,800 mg in sodium chloride 0.9 % 54 mL chemo infusion, 2,400 mg/m2 (Treatment Plan Recorded), Intravenous, 1 day or 1 dose, Sindy Guadeloupe, MD .  fluorouracil (ADRUCIL) chemo injection 800 mg, 400 mg/m2 (Treatment Plan Recorded), Intravenous, Once, Sindy Guadeloupe, MD .  heparin lock flush 100 unit/mL, 500 Units, Intravenous, Once, Sindy Guadeloupe, MD .  irinotecan (CAMPTOSAR) 300 mg in dextrose 5 % 500 mL chemo infusion, 150 mg/m2 (Treatment Plan  Recorded), Intravenous, Once, Sindy Guadeloupe, MD .  leucovorin 800 mg in dextrose 5 % 250 mL infusion, 402 mg/m2 (Treatment Plan Recorded), Intravenous, Once, Sindy Guadeloupe, MD .  sodium chloride flush (NS) 0.9 % injection 10 mL, 10 mL, Intravenous, PRN, Sindy Guadeloupe, MD, 10 mL at 07/27/18 0920 .  sodium chloride flush (NS) 0.9 % injection 10 mL, 10 mL, Intravenous, Once, Sindy Guadeloupe, MD  Physical exam:  Vitals:   12/25/20 0848  BP: (!) 144/66  Pulse: 71  Resp: 16  Temp: 97.8 F (36.6 C)  TempSrc: Oral  Weight: 176 lb 1.6 oz (79.9 kg)  Height: '5\' 6"'  (1.676 m)   Physical Exam Constitutional:      General: He is not in acute distress. Eyes:     Extraocular Movements: EOM normal.  Cardiovascular:     Rate and Rhythm: Normal rate and regular rhythm.     Heart sounds: Normal heart sounds.  Pulmonary:     Effort: Pulmonary effort is normal.  Skin:    General: Skin is warm and dry.  Neurological:     Mental Status: He is alert and oriented to person, place, and time.      CMP Latest Ref Rng & Units 12/25/2020  Glucose 70 - 99 mg/dL 193(H)  BUN 8 - 23 mg/dL 21  Creatinine 0.61 - 1.24 mg/dL 0.92  Sodium 135 - 145 mmol/L 138  Potassium 3.5 - 5.1 mmol/L 4.1  Chloride 98 - 111 mmol/L 106  CO2 22 - 32 mmol/L 19(L)  Calcium 8.9 - 10.3 mg/dL 8.8(L)  Total Protein 6.5 - 8.1 g/dL 7.4  Total Bilirubin 0.3 - 1.2 mg/dL 0.6  Alkaline Phos 38 - 126 U/L 81  AST 15 - 41 U/L 63(H)  ALT 0 - 44 U/L 36   CBC Latest Ref Rng & Units 12/25/2020  WBC 4.0 - 10.5 K/uL 5.2  Hemoglobin 13.0 - 17.0 g/dL 10.2(L)  Hematocrit 39.0 - 52.0 % 33.2(L)  Platelets 150 - 400 K/uL 165    No images are attached to the encounter.  CT CHEST ABDOMEN PELVIS W CONTRAST  Result Date: 11/28/2020 CLINICAL DATA:  Restaging colon cancer diagnosed in 2018 history of partial colonic resection and chemotherapy. Known Mets to the lungs in 2019 still receiving chemotherapy every 3 months. EXAM: CT CHEST, ABDOMEN,  AND PELVIS WITH CONTRAST TECHNIQUE: Multidetector CT imaging of the chest, abdomen and pelvis was performed following the standard protocol during bolus administration of intravenous contrast. CONTRAST:  79m OMNIPAQUE IOHEXOL 300 MG/ML  SOLN COMPARISON:  Multiple priors including CT chest abdomen pelvis July 20, 2020 FINDINGS: CT CHEST FINDINGS Cardiovascular: Left chest Port-A-Cath with tip in the right atrium. Aortic and great vessel atherosclerosis. Coronary artery calcifications. Normal size heart. No pericardial effusion. Mediastinum/Nodes: No enlarged mediastinal, hilar, or axillary lymph nodes. Thyroid gland, trachea, and esophagus demonstrate no significant findings. Lungs/Pleura: No pleural effusion or pneumothorax. Bilateral pulmonary nodules are again visualized. For reference: -Spiculated right lower lobe nodule measures 12 x 10 mm previously 10 x 10 mm (series 3, image 100). -Lobulated left upper lobe perihilar nodule measures 29 x 22 mm previously 18 x 17 mm (series 3, image 71). -The 2 other left upper lobe nodules measuring 10 mm (series 3, image 59) and 13 x 8 mm (series 3, image 82) are unchanged in size. No new nodules are identified. Musculoskeletal: No chest wall mass or suspicious bone lesions identified. CT ABDOMEN PELVIS FINDINGS Hepatobiliary: The liver is slightly hypoenhancing and comparisons filling suggestive of mild diffuse hepatic steatosis. Stable low-density lesion in the dome of the liver which measures 5 mm (series 2, image 46). Cholelithiasis without gallbladder dilation or wall thickening. No biliary ductal dilatation. Pancreas: Unremarkable. No pancreatic ductal dilatation or surrounding inflammatory changes. Spleen: Normal in size without focal abnormality. Adrenals/Urinary Tract: Adrenal glands are unremarkable. Kidneys are normal, without renal calculi, focal lesion, or hydronephrosis. Bladder is unremarkable. Stomach/Bowel: Stomach is within normal limits. Appendix  appears normal. No evidence of bowel wall thickening, distention, or inflammatory changes. Similar postsurgical changes of sigmoidectomy and anastomosis. Moderate volume of formed stool in the colon. Vascular/Lymphatic: Aortic atherosclerosis. No enlarged abdominal or pelvic lymph nodes. Reproductive: Similar dystrophic prostatic calcifications. Other: Postsurgical change in the anterior abdominal wall, similar prior. Stable prominent fat in bilateral inguinal canals. No abdominopelvic  ascites or peritoneal nodularity. Musculoskeletal: No acute or significant osseous findings. IMPRESSION: 1. Continued interval growth of 2 pulmonary nodules, consistent with metastatic disease. The additional 2 left upper lobe pulmonary nodules are unchanged in size. No new nodules are identified. 2. No evidence of metastatic disease within the abdomen or pelvis. 3. Similar postsurgical changes of sigmoidectomy and anastomosis. 4. Cholelithiasis without evidence of acute cholecystitis. 5. Aortic atherosclerosis. Aortic Atherosclerosis (ICD10-I70.0). Electronically Signed   By: Dahlia Bailiff MD   On: 11/28/2020 11:00     Assessment and plan- Patient is a 80 y.o. male with stage IV colon cancer and lung metastases. He has had progression on FOLFOXZirabevchemotherapy.   He is here for on treatment assessment prior to cycle 24 of FOLFIRI Zirabev chemotherapy  Patient has baseline chemo-induced anemia but his hemoglobin is presently stable around 10.  Counts are otherwise okay to proceed with cycle 24 of FOLFIRI Zirabev chemotherapy today.  He will be seen by covering NP in 3 weeks and I will see him back in 6 weeks for cycle 26.  He comes for pump disconnect on day 3 and receives [place] with every cycle.  His last scan in January 2022 showed slow progression of 2 of his lung nodules.  I plan to repeat a scan sometime in April and if there is continued progression of those 2 nodules I will consider SBRT to those  areas.  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 Encounter for antineoplastic chemotherapy   2. Encounter for monoclonal antibody treatment for malignancy   3. 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[place])   4. Anemia due to antineoplastic chemotherapy   5. Chemotherapy-induced peripheral neuropathy ([place])      Dr. Randa Evens, MD, MPH [facility] [phone] 12/25/2020 9:50 AM

## 2020-12-25 NOTE — Progress Notes (Signed)
Patient tolerated treatment well. Discharged home.

## 2020-12-26 LAB — CEA: CEA: 4.7 ng/mL (ref 0.0–4.7)

## 2020-12-27 ENCOUNTER — Inpatient Hospital Stay: Payer: Medicare Other

## 2020-12-27 VITALS — BP 160/70 | HR 80

## 2020-12-27 DIAGNOSIS — Z5111 Encounter for antineoplastic chemotherapy: Secondary | ICD-10-CM | POA: Diagnosis not present

## 2020-12-27 DIAGNOSIS — C7800 Secondary malignant neoplasm of unspecified lung: Secondary | ICD-10-CM

## 2020-12-27 DIAGNOSIS — C189 Malignant neoplasm of colon, unspecified: Secondary | ICD-10-CM

## 2020-12-27 MED ORDER — SODIUM CHLORIDE 0.9% FLUSH
10.0000 mL | INTRAVENOUS | Status: DC | PRN
  Administered 2020-12-27: 10 mL
  Filled 2020-12-27: qty 10

## 2020-12-27 MED ORDER — HEPARIN SOD (PORK) LOCK FLUSH 100 UNIT/ML IV SOLN
500.0000 [IU] | Freq: Once | INTRAVENOUS | Status: AC | PRN
  Administered 2020-12-27: 500 [IU]
  Filled 2020-12-27: qty 5

## 2020-12-27 MED ORDER — HEPARIN SOD (PORK) LOCK FLUSH 100 UNIT/ML IV SOLN
INTRAVENOUS | Status: AC
  Filled 2020-12-27: qty 5

## 2020-12-27 MED ORDER — PEGFILGRASTIM-CBQV 6 MG/0.6ML ~~LOC~~ SOSY
6.0000 mg | PREFILLED_SYRINGE | Freq: Once | SUBCUTANEOUS | Status: AC
  Administered 2020-12-27: 6 mg via SUBCUTANEOUS
  Filled 2020-12-27: qty 0.6

## 2021-01-02 ENCOUNTER — Telehealth: Payer: Self-pay | Admitting: *Deleted

## 2021-01-02 NOTE — Telephone Encounter (Signed)
Pt called and gave me the new meds he is on

## 2021-01-15 ENCOUNTER — Inpatient Hospital Stay (HOSPITAL_BASED_OUTPATIENT_CLINIC_OR_DEPARTMENT_OTHER): Payer: Medicare Other | Admitting: Nurse Practitioner

## 2021-01-15 ENCOUNTER — Inpatient Hospital Stay: Payer: Medicare Other | Attending: Oncology

## 2021-01-15 ENCOUNTER — Inpatient Hospital Stay: Payer: Medicare Other

## 2021-01-15 ENCOUNTER — Other Ambulatory Visit: Payer: Self-pay

## 2021-01-15 VITALS — BP 145/67 | HR 70 | Temp 98.0°F | Resp 16 | Wt 175.5 lb

## 2021-01-15 DIAGNOSIS — G620 Drug-induced polyneuropathy: Secondary | ICD-10-CM | POA: Diagnosis not present

## 2021-01-15 DIAGNOSIS — Z5112 Encounter for antineoplastic immunotherapy: Secondary | ICD-10-CM

## 2021-01-15 DIAGNOSIS — Z79899 Other long term (current) drug therapy: Secondary | ICD-10-CM | POA: Diagnosis not present

## 2021-01-15 DIAGNOSIS — T451X5A Adverse effect of antineoplastic and immunosuppressive drugs, initial encounter: Secondary | ICD-10-CM | POA: Diagnosis not present

## 2021-01-15 DIAGNOSIS — C189 Malignant neoplasm of colon, unspecified: Secondary | ICD-10-CM

## 2021-01-15 DIAGNOSIS — Z5111 Encounter for antineoplastic chemotherapy: Secondary | ICD-10-CM | POA: Diagnosis present

## 2021-01-15 DIAGNOSIS — Z5189 Encounter for other specified aftercare: Secondary | ICD-10-CM | POA: Insufficient documentation

## 2021-01-15 DIAGNOSIS — C187 Malignant neoplasm of sigmoid colon: Secondary | ICD-10-CM | POA: Diagnosis present

## 2021-01-15 DIAGNOSIS — C7800 Secondary malignant neoplasm of unspecified lung: Secondary | ICD-10-CM

## 2021-01-15 DIAGNOSIS — D6481 Anemia due to antineoplastic chemotherapy: Secondary | ICD-10-CM | POA: Diagnosis not present

## 2021-01-15 LAB — COMPREHENSIVE METABOLIC PANEL
ALT: 41 U/L (ref 0–44)
AST: 49 U/L — ABNORMAL HIGH (ref 15–41)
Albumin: 4.1 g/dL (ref 3.5–5.0)
Alkaline Phosphatase: 90 U/L (ref 38–126)
Anion gap: 12 (ref 5–15)
BUN: 20 mg/dL (ref 8–23)
CO2: 22 mmol/L (ref 22–32)
Calcium: 9.1 mg/dL (ref 8.9–10.3)
Chloride: 103 mmol/L (ref 98–111)
Creatinine, Ser: 0.94 mg/dL (ref 0.61–1.24)
GFR, Estimated: >60 mL/min (ref >60)
Glucose, Bld: 162 mg/dL — ABNORMAL HIGH (ref 70–99)
Potassium: 4.3 mmol/L (ref 3.5–5.1)
Sodium: 137 mmol/L (ref 135–145)
Total Bilirubin: 0.7 mg/dL (ref 0.3–1.2)
Total Protein: 7.2 g/dL (ref 6.5–8.1)

## 2021-01-15 LAB — CBC WITH DIFFERENTIAL/PLATELET
Abs Immature Granulocytes: 0.03 10*3/uL (ref 0.00–0.07)
Basophils Absolute: 0.1 10*3/uL (ref 0.0–0.1)
Basophils Relative: 1 %
Eosinophils Absolute: 0.2 10*3/uL (ref 0.0–0.5)
Eosinophils Relative: 3 %
HCT: 33.3 % — ABNORMAL LOW (ref 39.0–52.0)
Hemoglobin: 10.3 g/dL — ABNORMAL LOW (ref 13.0–17.0)
Immature Granulocytes: 0 %
Lymphocytes Relative: 29 %
Lymphs Abs: 2 10*3/uL (ref 0.7–4.0)
MCH: 27.3 pg (ref 26.0–34.0)
MCHC: 30.9 g/dL (ref 30.0–36.0)
MCV: 88.3 fL (ref 80.0–100.0)
Monocytes Absolute: 0.5 10*3/uL (ref 0.1–1.0)
Monocytes Relative: 8 %
Neutro Abs: 3.9 10*3/uL (ref 1.7–7.7)
Neutrophils Relative %: 59 %
Platelets: 212 10*3/uL (ref 150–400)
RBC: 3.77 MIL/uL — ABNORMAL LOW (ref 4.22–5.81)
RDW: 19.4 % — ABNORMAL HIGH (ref 11.5–15.5)
WBC: 6.7 10*3/uL (ref 4.0–10.5)
nRBC: 0 % (ref 0.0–0.2)

## 2021-01-15 LAB — PROTEIN, URINE, RANDOM: Total Protein, Urine: 15 mg/dL

## 2021-01-15 MED ORDER — LEUCOVORIN CALCIUM INJECTION 350 MG
402.0000 mg/m2 | Freq: Once | INTRAVENOUS | Status: AC
  Administered 2021-01-15: 800 mg via INTRAVENOUS
  Filled 2021-01-15: qty 40

## 2021-01-15 MED ORDER — PALONOSETRON HCL INJECTION 0.25 MG/5ML
0.2500 mg | Freq: Once | INTRAVENOUS | Status: AC
  Administered 2021-01-15: 0.25 mg via INTRAVENOUS
  Filled 2021-01-15: qty 5

## 2021-01-15 MED ORDER — SODIUM CHLORIDE 0.9 % IV SOLN
Freq: Once | INTRAVENOUS | Status: AC
  Filled 2021-01-15: qty 250

## 2021-01-15 MED ORDER — SODIUM CHLORIDE 0.9% FLUSH
10.0000 mL | Freq: Once | INTRAVENOUS | Status: AC
  Administered 2021-01-15: 10 mL via INTRAVENOUS
  Filled 2021-01-15: qty 10

## 2021-01-15 MED ORDER — SODIUM CHLORIDE 0.9 % IV SOLN
5.0000 mg/kg | Freq: Once | INTRAVENOUS | Status: AC
  Administered 2021-01-15: 400 mg via INTRAVENOUS
  Filled 2021-01-15: qty 16

## 2021-01-15 MED ORDER — IRINOTECAN HCL CHEMO INJECTION 100 MG/5ML
150.0000 mg/m2 | Freq: Once | INTRAVENOUS | Status: AC
  Administered 2021-01-15: 300 mg via INTRAVENOUS
  Filled 2021-01-15: qty 15

## 2021-01-15 MED ORDER — FLUOROURACIL CHEMO INJECTION 5 GM/100ML
2400.0000 mg/m2 | INTRAVENOUS | Status: DC
  Administered 2021-01-15: 4800 mg via INTRAVENOUS
  Filled 2021-01-15: qty 96

## 2021-01-15 MED ORDER — DEXAMETHASONE SODIUM PHOSPHATE 10 MG/ML IJ SOLN
10.0000 mg | Freq: Once | INTRAMUSCULAR | Status: AC
  Administered 2021-01-15: 10 mg via INTRAVENOUS
  Filled 2021-01-15: qty 1

## 2021-01-15 MED ORDER — FLUOROURACIL CHEMO INJECTION 2.5 GM/50ML
400.0000 mg/m2 | Freq: Once | INTRAVENOUS | Status: AC
  Administered 2021-01-15: 800 mg via INTRAVENOUS
  Filled 2021-01-15: qty 16

## 2021-01-15 MED ORDER — ATROPINE SULFATE 1 MG/ML IJ SOLN
0.5000 mg | Freq: Once | INTRAMUSCULAR | Status: AC | PRN
  Administered 2021-01-15: 0.5 mg via INTRAVENOUS
  Filled 2021-01-15: qty 1

## 2021-01-15 NOTE — Progress Notes (Signed)
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 At that time he had progressive abdominal distention and no bowel movement for about one week. CT abdomen showed an apple core lesion in the descending/sigmoid colon.  2. Patient underwent Hartmann's procedure for obstructing sigmoid colon mass on 02/06/2017. Preoperative CEA was 5.0  3. 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 It is 0 out of 15 lymph nodes were positive for malignancy. Perineural and lymphovascular invasion was present. Margins were negative.pT3N0. MMR stable.  4. Given that he had high risk stage II colon cancer including he presented with obstruction and has LVI and perineural invasion- adjuvant xeloda chemotherapy was recommended for 6 months.   5.Patient had evidence of hand foot syndrome after cycle 3. Cycle 4 delayed by 1 week. Topical urea cream prescribed  6. Patient completed 8 cycles of adjuvant xeloda in October 2018. polps seen in sigmoid and decending colon but negative for malignancy  7. Repeat Ct abdomen after 8 cycles showed no metastatic disease. 6 mm lung nodule noted in RLL  8.Patient had a repeat colonoscopy in October 2018 which showed polyps in his transverse and  descending colon which were taken out and were negative for high-grade dysplasia or malignancy. Patient subsequently underwent colostomy takedown procedure by Dr. Burt Knack  9. Repeat CT chest abdomen/ pelvis showed increase in the size of previously seen lung nodules largest being 1.4 cm consistent with metastatic disease. PET/CT showed mild uptake in 2 lung nodules. Others were not hypermetabolic. No other evidence of metastatic disease  10. Repeat lung biopsy consistent with colon adenocarcinoma. Comprehensive RAS panel testing showed mutant KRAS  11.Given the slow rate of growth of his colon cancer and desire to maintain his quality of life plan was to start chemotherapy after he gets a 39-monthbreak in summer. Repeat CT chest abdomen and pelvis in August 2019 showed mild increase in the size of his lung nodules. No new lung nodules are no new sites of distant metastatic disease.FOLFOX Avastin chemotherapy started on 06/22/2018.Patient completed 12 cycles of FOLFOX Avastin chemotherapy with continued response to his lung lesions. Oxaliplatin was dropped and patient is currently on 5-FU Avastin.He did not have mild progression of her disease in lung nodules and was switched to second line FOLFIRI Mvasi  12. NGSshowed no actionable mutations. PD-L1 less than 1%.No evidence of HER2 and NTRKmutations  Interval history- Continue to do well and denies specific complaints. Neuropathy is stable on Lyrica. Fatigue is stable.  Continues to have runny nose which is aggravating but not significantly bothersome.   ECOG PS- 1 Pain scale- 0 Opioid associated constipation- no  Review of systems- Review of Systems  Constitutional: Positive for malaise/fatigue. Negative for chills, fever and weight loss.  HENT: Negative for congestion, ear discharge and nosebleeds.   Eyes: Negative for blurred vision.  Respiratory: Negative for cough, hemoptysis, sputum production, shortness of  breath and  wheezing.   Cardiovascular: Negative for chest pain, palpitations, orthopnea and claudication.  Gastrointestinal: Negative for abdominal pain, blood in stool, constipation, diarrhea, heartburn, melena, nausea and vomiting.  Genitourinary: Negative for dysuria, flank pain, frequency, hematuria and urgency.  Musculoskeletal: Negative for back pain, joint pain and myalgias.  Skin: Negative for rash.  Neurological: Positive for sensory change (peripheral neuropathy). Negative for dizziness, tingling, focal weakness, seizures, weakness and headaches.  Endo/Heme/Allergies: Does not bruise/bleed easily.  Psychiatric/Behavioral: Negative for depression and suicidal ideas. The patient does not have insomnia.      No Known Allergies   Past Medical History:  Diagnosis Date  . Cancer ([place])    skin cancer-nose w graft,also shoulder- basal cell per pt  . Cataract    r eye  . Chronic kidney disease    kidney stones 20 y ago per pt  . Colon cancer ([place]) 2018   Surgical resection and chemo tx's.  . Diabetes mellitus without complication ([place])   . Eczema   . History of kidney stones   . Hyperlipidemia   . Hypertension   . Vertigo      Past Surgical History:  Procedure Laterality Date  . CATARACT EXTRACTION Left   . COLECTOMY WITH COLOSTOMY CREATION/HARTMANN PROCEDURE N/A 02/06/2017   Procedure: COLECTOMY WITH COLOSTOMY CREATION/HARTMANN PROCEDURE;  Surgeon: [name], MD;  Location: [facility] ORS;  Service: General;  Laterality: N/A;  . COLON SURGERY    . COLONOSCOPY WITH PROPOFOL N/A 09/04/2017   Procedure: COLONOSCOPY WITH PROPOFOL;  Surgeon: Jonathon Bellows, MD;  Location: [facility] ENDOSCOPY;  Service: Gastroenterology;  Laterality: N/A;  . COLOSTOMY CLOSURE N/A 10/20/2017   Procedure: COLOSTOMY CLOSURE;  Surgeon: [name], MD;  Location: [facility] ORS;  Service: General;  Laterality: N/A;  . CYSTOSCOPY    . FLEXIBLE SIGMOIDOSCOPY N/A 02/06/2017   Procedure: FLEXIBLE SIGMOIDOSCOPY;   Surgeon: Christene Lye, MD;  Location: [facility] ENDOSCOPY;  Service: Endoscopy;  Laterality: N/A;  . PORTACATH PLACEMENT N/A 06/17/2018   Procedure: INSERTION PORT-A-CATH, WITH FLUOROSCOPY;  Surgeon: [name], MD;  Location: [facility] ORS;  Service: General;  Laterality: N/A;    Social History   Socioeconomic History  . Marital status: Married    Spouse name: Not on file  . Number of children: Not on file  . Years of education: Not on file  . Highest education level: Not on file  Occupational History  . Not on file  Tobacco Use  . Smoking status: Never Smoker  . Smokeless tobacco: Never Used  Vaping Use  . Vaping Use: Never used  Substance and Sexual Activity  . Alcohol use: No  . Drug use: No  . Sexual activity: Not Currently  Other Topics Concern  . Not on file  Social History Narrative  . Not on file   Social Determinants of Health   Financial Resource Strain: Not on file  Food Insecurity: Not on file  Transportation Needs: Not on file  Physical Activity: Not on file  Stress: Not on file  Social Connections: Not on file  Intimate Partner Violence: Not on file   Immunization History  Administered Date(s) Administered  . Fluad Quad(high Dose 65+) 08/09/2019, 08/16/2020  . Influenza Inj Mdck Quad Pf 10/09/2017  . Influenza Split 09/05/2014, 10/02/2015  . Influenza, High Dose Seasonal PF 09/16/2018  . Influenza-Unspecified 08/15/2013, 10/09/2017  . Moderna Sars-Covid-2 Vaccination 11/25/2019, 12/23/2019  . Pneumococcal Conjugate-13 10/07/2016, 11/10/2016  . Pneumococcal Polysaccharide-23 07/26/2007  . Tdap 05/03/2014  .  Zoster 11/11/2011  . Zoster Recombinat (Shingrix) 08/09/2019, 02/13/2020    Family History  Problem Relation Age of Onset  . Diabetes Mother   . AAA (abdominal aortic aneurysm) Mother   . Coronary artery disease Mother   . Diabetes Father   . Coronary artery disease Father   . Dementia Father   . Breast cancer Sister     Current  Outpatient Medications:  .  acetaminophen (TYLENOL) 500 MG tablet, Take 1,000 mg by mouth in the morning and at bedtime., Disp: , Rfl:  .  Apremilast (OTEZLA PO), Take 1 tablet by mouth daily., Disp: , Rfl:  .  aspirin EC 81 MG tablet, Take 81 mg by mouth daily., Disp: , Rfl:  .  clobetasol cream (TEMOVATE) 7.02 %, Apply 1 application topically 2 (two) times daily as needed., Disp: , Rfl:  .  Docusate Sodium (COLACE PO), Take 1 Dose by mouth as needed. (Patient not taking: No sig reported), Disp: , Rfl:  .  Dulaglutide 0.75 MG/0.5ML SOPN, Inject 1.5 mg into the skin once a week. , Disp: , Rfl:  .  glimepiride (AMARYL) 2 MG tablet, Take 2 mg by mouth 2 (two) times daily before a meal. , Disp: , Rfl:  .  lidocaine-prilocaine (EMLA) cream, Place a small amount over cream over port 1 hour before each treatment, cover cream with saran wrap for clothing protection (Patient not taking: No sig reported), Disp: 30 g, Rfl: 1 .  loratadine (CLARITIN) 10 MG tablet, Take 10 mg by mouth daily., Disp: , Rfl:  .  losartan (COZAAR) 25 MG tablet, Take 25 mg by mouth daily., Disp: , Rfl:  .  metFORMIN (GLUCOPHAGE) 1000 MG tablet, Take 1,000-1,500 tablets by mouth See admin instructions. TAKE 1 TABLET (1000 MG) BY MOUTH IN THE MORNING & TAKE 1.5 TABLETS (1500 MG) BY MOUTH AT SUPPER, Disp: , Rfl:  .  ondansetron (ZOFRAN) 8 MG tablet, Take 1 tablet (8 mg total) by mouth every 8 (eight) hours as needed for nausea or vomiting. (Patient not taking: No sig reported), Disp: 20 tablet, Rfl: 0 .  ONETOUCH VERIO test strip, , Disp: , Rfl:  .  pravastatin (PRAVACHOL) 20 MG tablet, Take 20 mg by mouth daily with supper. , Disp: , Rfl:  .  pregabalin (LYRICA) 75 MG capsule, TAKE 1 CAPSULE BY MOUTH TWICE A DAY, Disp: 60 capsule, Rfl: 2 .  triamcinolone (KENALOG) 0.025 % cream, Apply 1 application topically daily as needed (FOR EZCEMA)., Disp: 30 g, Rfl: 2 No current facility-administered medications for this  visit.  Facility-Administered Medications Ordered in Other Visits:  .  0.9 %  sodium chloride infusion, , Intravenous, Continuous, Sindy Guadeloupe, MD, Stopped at 07/27/18 1037 .  heparin lock flush 100 unit/mL, 500 Units, Intravenous, Once, Sindy Guadeloupe, MD .  sodium chloride flush (NS) 0.9 % injection 10 mL, 10 mL, Intravenous, PRN, Sindy Guadeloupe, MD, 10 mL at 07/27/18 0920 .  sodium chloride flush (NS) 0.9 % injection 10 mL, 10 mL, Intravenous, Once, Sindy Guadeloupe, MD  Physical exam:  There were no vitals filed for this visit. Physical Exam Constitutional:      General: He is not in acute distress. Cardiovascular:     Rate and Rhythm: Normal rate and regular rhythm.     Heart sounds: Normal heart sounds.  Pulmonary:     Effort: Pulmonary effort is normal.  Abdominal:     General: There is no distension.     Palpations:  Abdomen is soft.     Tenderness: There is no abdominal tenderness.  Musculoskeletal:     Comments: ambulatory  Skin:    General: Skin is warm and dry.  Neurological:     Mental Status: He is alert and oriented to person, place, and time.  Psychiatric:        Mood and Affect: Mood normal.        Behavior: Behavior normal.      CMP Latest Ref Rng & Units 12/25/2020  Glucose 70 - 99 mg/dL 193(H)  BUN 8 - 23 mg/dL 21  Creatinine 0.61 - 1.24 mg/dL 0.92  Sodium 135 - 145 mmol/L 138  Potassium 3.5 - 5.1 mmol/L 4.1  Chloride 98 - 111 mmol/L 106  CO2 22 - 32 mmol/L 19(L)  Calcium 8.9 - 10.3 mg/dL 8.8(L)  Total Protein 6.5 - 8.1 g/dL 7.4  Total Bilirubin 0.3 - 1.2 mg/dL 0.6  Alkaline Phos 38 - 126 U/L 81  AST 15 - 41 U/L 63(H)  ALT 0 - 44 U/L 36   CBC Latest Ref Rng & Units 12/25/2020  WBC 4.0 - 10.5 K/uL 5.2  Hemoglobin 13.0 - 17.0 g/dL 10.2(L)  Hematocrit 39.0 - 52.0 % 33.2(L)  Platelets 150 - 400 K/uL 165    No images are attached to the encounter.  No results found.   Assessment and plan- Patient is a 80 y.o. male with stage IV colon cancer  and lung metastases. He has had progression on FOLFOXZirabevchemotherapy.   He is here for on treatment assessment prior to cycle 24 of FOLFIRI Zirabev chemotherapy  Patient has baseline chemo-induced anemia. Hemoglobin today is stable, baseline around 10. Counts reviewed and acceptable to proceed with cycle 25 of FOLFIRI Zirabev chemotherapy today.  Return to clinic in 3 weeks for evaluation and consideration of cycle 26 with Dr. [name] as scheduled. He will return for pump disconnect on day 3 with Udenyca support with each cycle.   January 2022 imaging showed slow progress of 2 lung nodules. Plan to re-image in April. If continued progression of those nodules, consider SBRT.   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   Rhinorrhea- trial flonase.    Visit Diagnosis 1. Encounter for antineoplastic chemotherapy   2. Encounter for monoclonal antibody treatment for malignancy   3. 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[place])   4. Anemia due to antineoplastic chemotherapy   5. Chemotherapy-induced peripheral neuropathy ([place])     Beckey Rutter, DNP, AGNP-C [place] at [facility] [phone] (clinic)

## 2021-01-17 ENCOUNTER — Inpatient Hospital Stay: Payer: Medicare Other

## 2021-01-17 DIAGNOSIS — C7800 Secondary malignant neoplasm of unspecified lung: Secondary | ICD-10-CM

## 2021-01-17 DIAGNOSIS — C189 Malignant neoplasm of colon, unspecified: Secondary | ICD-10-CM

## 2021-01-17 DIAGNOSIS — Z5111 Encounter for antineoplastic chemotherapy: Secondary | ICD-10-CM | POA: Diagnosis not present

## 2021-01-17 MED ORDER — HEPARIN SOD (PORK) LOCK FLUSH 100 UNIT/ML IV SOLN
INTRAVENOUS | Status: AC
  Filled 2021-01-17: qty 5

## 2021-01-17 MED ORDER — PEGFILGRASTIM-CBQV 6 MG/0.6ML ~~LOC~~ SOSY
6.0000 mg | PREFILLED_SYRINGE | Freq: Once | SUBCUTANEOUS | Status: AC
  Administered 2021-01-17: 6 mg via SUBCUTANEOUS
  Filled 2021-01-17: qty 0.6

## 2021-01-17 MED ORDER — HEPARIN SOD (PORK) LOCK FLUSH 100 UNIT/ML IV SOLN
500.0000 [IU] | Freq: Once | INTRAVENOUS | Status: AC | PRN
  Administered 2021-01-17: 500 [IU]
  Filled 2021-01-17: qty 5

## 2021-01-17 MED ORDER — SODIUM CHLORIDE 0.9% FLUSH
10.0000 mL | INTRAVENOUS | Status: DC | PRN
Start: 2021-01-17 — End: 2021-01-17
  Administered 2021-01-17: 10 mL
  Filled 2021-01-17: qty 10

## 2021-02-03 ENCOUNTER — Other Ambulatory Visit: Payer: Self-pay | Admitting: *Deleted

## 2021-02-03 DIAGNOSIS — C189 Malignant neoplasm of colon, unspecified: Secondary | ICD-10-CM

## 2021-02-03 DIAGNOSIS — C7800 Secondary malignant neoplasm of unspecified lung: Secondary | ICD-10-CM

## 2021-02-05 ENCOUNTER — Encounter: Payer: Self-pay | Admitting: Oncology

## 2021-02-05 ENCOUNTER — Other Ambulatory Visit: Payer: Self-pay | Admitting: *Deleted

## 2021-02-05 ENCOUNTER — Inpatient Hospital Stay: Payer: Medicare Other

## 2021-02-05 ENCOUNTER — Inpatient Hospital Stay (HOSPITAL_BASED_OUTPATIENT_CLINIC_OR_DEPARTMENT_OTHER): Payer: Medicare Other | Admitting: Oncology

## 2021-02-05 VITALS — BP 142/57 | HR 75 | Temp 97.0°F | Resp 16 | Ht 66.0 in | Wt 172.3 lb

## 2021-02-05 VITALS — BP 160/75 | HR 69 | Resp 16

## 2021-02-05 DIAGNOSIS — C7800 Secondary malignant neoplasm of unspecified lung: Secondary | ICD-10-CM

## 2021-02-05 DIAGNOSIS — Z5112 Encounter for antineoplastic immunotherapy: Secondary | ICD-10-CM

## 2021-02-05 DIAGNOSIS — G620 Drug-induced polyneuropathy: Secondary | ICD-10-CM | POA: Diagnosis not present

## 2021-02-05 DIAGNOSIS — C189 Malignant neoplasm of colon, unspecified: Secondary | ICD-10-CM | POA: Diagnosis not present

## 2021-02-05 DIAGNOSIS — Z5111 Encounter for antineoplastic chemotherapy: Secondary | ICD-10-CM

## 2021-02-05 DIAGNOSIS — T451X5A Adverse effect of antineoplastic and immunosuppressive drugs, initial encounter: Secondary | ICD-10-CM

## 2021-02-05 LAB — COMPREHENSIVE METABOLIC PANEL
ALT: 33 U/L (ref 0–44)
AST: 56 U/L — ABNORMAL HIGH (ref 15–41)
Albumin: 4.1 g/dL (ref 3.5–5.0)
Alkaline Phosphatase: 86 U/L (ref 38–126)
Anion gap: 13 (ref 5–15)
BUN: 19 mg/dL (ref 8–23)
CO2: 19 mmol/L — ABNORMAL LOW (ref 22–32)
Calcium: 8.8 mg/dL — ABNORMAL LOW (ref 8.9–10.3)
Chloride: 104 mmol/L (ref 98–111)
Creatinine, Ser: 0.98 mg/dL (ref 0.61–1.24)
GFR, Estimated: >60 mL/min (ref >60)
Glucose, Bld: 232 mg/dL — ABNORMAL HIGH (ref 70–99)
Potassium: 4.3 mmol/L (ref 3.5–5.1)
Sodium: 136 mmol/L (ref 135–145)
Total Bilirubin: 0.6 mg/dL (ref 0.3–1.2)
Total Protein: 7 g/dL (ref 6.5–8.1)

## 2021-02-05 LAB — CBC WITH DIFFERENTIAL/PLATELET
Abs Immature Granulocytes: 0.02 10*3/uL (ref 0.00–0.07)
Basophils Absolute: 0.1 10*3/uL (ref 0.0–0.1)
Basophils Relative: 1 %
Eosinophils Absolute: 0.2 10*3/uL (ref 0.0–0.5)
Eosinophils Relative: 3 %
HCT: 33.6 % — ABNORMAL LOW (ref 39.0–52.0)
Hemoglobin: 10.1 g/dL — ABNORMAL LOW (ref 13.0–17.0)
Immature Granulocytes: 0 %
Lymphocytes Relative: 23 %
Lymphs Abs: 1.7 10*3/uL (ref 0.7–4.0)
MCH: 26.9 pg (ref 26.0–34.0)
MCHC: 30.1 g/dL (ref 30.0–36.0)
MCV: 89.6 fL (ref 80.0–100.0)
Monocytes Absolute: 0.4 10*3/uL (ref 0.1–1.0)
Monocytes Relative: 6 %
Neutro Abs: 5.1 10*3/uL (ref 1.7–7.7)
Neutrophils Relative %: 67 %
Platelets: 187 10*3/uL (ref 150–400)
RBC: 3.75 MIL/uL — ABNORMAL LOW (ref 4.22–5.81)
RDW: 19.9 % — ABNORMAL HIGH (ref 11.5–15.5)
WBC: 7.5 10*3/uL (ref 4.0–10.5)
nRBC: 0 % (ref 0.0–0.2)

## 2021-02-05 LAB — PROTEIN, URINE, RANDOM: Total Protein, Urine: 22 mg/dL

## 2021-02-05 MED ORDER — SODIUM CHLORIDE 0.9% FLUSH
10.0000 mL | INTRAVENOUS | Status: DC | PRN
  Administered 2021-02-05: 10 mL via INTRAVENOUS
  Filled 2021-02-05: qty 10

## 2021-02-05 MED ORDER — LEUCOVORIN CALCIUM INJECTION 350 MG
402.0000 mg/m2 | Freq: Once | INTRAVENOUS | Status: DC
  Filled 2021-02-05: qty 40

## 2021-02-05 MED ORDER — SODIUM CHLORIDE 0.9 % IV SOLN
5.0000 mg/kg | Freq: Once | INTRAVENOUS | Status: AC
  Administered 2021-02-05: 400 mg via INTRAVENOUS
  Filled 2021-02-05: qty 16

## 2021-02-05 MED ORDER — SODIUM CHLORIDE 0.9 % IV SOLN
Freq: Once | INTRAVENOUS | Status: AC
  Filled 2021-02-05: qty 250

## 2021-02-05 MED ORDER — SODIUM CHLORIDE 0.9 % IV SOLN
150.0000 mg/m2 | Freq: Once | INTRAVENOUS | Status: AC
  Administered 2021-02-05: 300 mg via INTRAVENOUS
  Filled 2021-02-05: qty 15

## 2021-02-05 MED ORDER — ATROPINE SULFATE 1 MG/ML IJ SOLN
0.5000 mg | Freq: Once | INTRAMUSCULAR | Status: AC | PRN
  Administered 2021-02-05: 0.5 mg via INTRAVENOUS
  Filled 2021-02-05: qty 1

## 2021-02-05 MED ORDER — DEXAMETHASONE SODIUM PHOSPHATE 10 MG/ML IJ SOLN
10.0000 mg | Freq: Once | INTRAMUSCULAR | Status: AC
  Administered 2021-02-05: 10 mg via INTRAVENOUS
  Filled 2021-02-05: qty 1

## 2021-02-05 MED ORDER — IRINOTECAN HCL CHEMO INJECTION 100 MG/5ML
150.0000 mg/m2 | Freq: Once | INTRAVENOUS | Status: DC
  Filled 2021-02-05: qty 15

## 2021-02-05 MED ORDER — FLUOROURACIL CHEMO INJECTION 2.5 GM/50ML
400.0000 mg/m2 | Freq: Once | INTRAVENOUS | Status: AC
  Administered 2021-02-05: 800 mg via INTRAVENOUS
  Filled 2021-02-05: qty 16

## 2021-02-05 MED ORDER — SODIUM CHLORIDE 0.9 % IV SOLN
800.0000 mg | Freq: Once | INTRAVENOUS | Status: DC
  Filled 2021-02-05: qty 40

## 2021-02-05 MED ORDER — SODIUM CHLORIDE 0.9 % IV SOLN
800.0000 mg | Freq: Once | INTRAVENOUS | Status: AC
  Administered 2021-02-05: 800 mg via INTRAVENOUS
  Filled 2021-02-05: qty 40

## 2021-02-05 MED ORDER — PALONOSETRON HCL INJECTION 0.25 MG/5ML
0.2500 mg | Freq: Once | INTRAVENOUS | Status: AC
  Administered 2021-02-05: 0.25 mg via INTRAVENOUS
  Filled 2021-02-05: qty 5

## 2021-02-05 MED ORDER — PREGABALIN 75 MG PO CAPS: 75.0000 mg | ORAL_CAPSULE | Freq: Two times a day (BID) | ORAL | 3 refills | Status: DC

## 2021-02-05 MED ORDER — SODIUM CHLORIDE 0.9 % IV SOLN
2400.0000 mg/m2 | INTRAVENOUS | Status: DC
  Administered 2021-02-05: 4800 mg via INTRAVENOUS
  Filled 2021-02-05: qty 96

## 2021-02-05 MED ORDER — HEPARIN SOD (PORK) LOCK FLUSH 100 UNIT/ML IV SOLN
500.0000 [IU] | Freq: Once | INTRAVENOUS | Status: DC
  Filled 2021-02-05: qty 5

## 2021-02-05 NOTE — Progress Notes (Signed)
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 At that time he had progressive abdominal distention and no bowel movement for about one week. CT abdomen showed an apple core lesion in the descending/sigmoid colon.  2. Patient underwent Hartmann's procedure for obstructing sigmoid colon mass on 02/06/2017. Preoperative CEA was 5.0  3. 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 It is 0 out of 15 lymph nodes were positive for malignancy. Perineural and lymphovascular invasion was present. Margins were negative.pT3N0. MMR stable.  4. Given that he had high risk stage II colon cancer including he presented with obstruction and has LVI and perineural invasion- adjuvant xeloda chemotherapy was recommended for 6 months.   5.Patient had evidence of hand foot syndrome after cycle 3. Cycle 4 delayed by 1 week. Topical urea cream prescribed  6. Patient completed 8 cycles of adjuvant xeloda in October 2018. polps seen in sigmoid and decending colon but negative for malignancy  7. Repeat Ct abdomen after 8 cycles showed no metastatic disease. 6 mm lung nodule noted in RLL  8.Patient had a repeat colonoscopy in October 2018 which showed polyps in his transverse  and descending colon which were taken out and were negative for high-grade dysplasia or malignancy. Patient subsequently underwent colostomy takedown procedure by Dr. Burt Knack  9. Repeat CT chest abdomen/ pelvis showed increase in the size of previously seen lung nodules largest being 1.4 cm consistent with metastatic disease. PET/CT showed mild uptake in 2 lung nodules. Others were not hypermetabolic. No other evidence of metastatic disease  10. Repeat lung biopsy consistent with colon adenocarcinoma. Comprehensive RAS panel testing showed mutant KRAS  11.Given the slow rate of growth of his colon cancer and desire to maintain his quality of life plan was to start chemotherapy after he gets a 20-monthbreak in summer. Repeat CT chest abdomen and pelvis in August 2019 showed mild increase in the size of his lung nodules. No new lung nodules are no new sites of distant metastatic disease.FOLFOX Avastin chemotherapy started on 06/22/2018.Patient completed 12 cycles of FOLFOX Avastin chemotherapy with continued response to his lung lesions. Oxaliplatin was dropped and patient is currently on 5-FU Avastin.He did not have mild progression of her disease in lung nodules and was switched to second line FOLFIRI Mvasi  12. NGSshowed no actionable mutations. PD-L1 less than 1%.No evidence of HER2 and NTRKmutations   Interval history-patient is doing well and denies any specific complaints at this time.  Neuropathy is currently stable with Lyrica  ECOG PS- 1 Pain scale- 0 Opioid associated constipation- no  Review of systems- Review of Systems  Constitutional: Negative for chills, fever, malaise/fatigue and weight loss.  HENT: Negative for congestion, ear discharge and nosebleeds.   Eyes: Negative for blurred vision.  Respiratory: Negative for cough, hemoptysis, sputum production, shortness of breath and wheezing.   Cardiovascular: Negative for chest pain, palpitations, orthopnea  and  claudication.  Gastrointestinal: Negative for abdominal pain, blood in stool, constipation, diarrhea, heartburn, melena, nausea and vomiting.  Genitourinary: Negative for dysuria, flank pain, frequency, hematuria and urgency.  Musculoskeletal: Negative for back pain, joint pain and myalgias.  Skin: Negative for rash.  Neurological: Positive for sensory change (Peripheral neuropathy). Negative for dizziness, tingling, focal weakness, seizures, weakness and headaches.  Endo/Heme/Allergies: Does not bruise/bleed easily.  Psychiatric/Behavioral: Negative for depression and suicidal ideas. The patient does not have insomnia.       No Known Allergies   Past Medical History:  Diagnosis Date  . Cancer ([place])    skin cancer-nose w graft,also shoulder- basal cell per pt  . Cataract    r eye  . Chronic kidney disease    kidney stones 20 y ago per pt  . Colon cancer ([place]) 2018   Surgical resection and chemo tx's.  . Diabetes mellitus without complication ([employer])   . Eczema   . History of kidney stones   . Hyperlipidemia   . Hypertension   . Vertigo      Past Surgical History:  Procedure Laterality Date  . CATARACT EXTRACTION Left   . COLECTOMY WITH COLOSTOMY CREATION/HARTMANN PROCEDURE N/A 02/06/2017   Procedure: COLECTOMY WITH COLOSTOMY CREATION/HARTMANN PROCEDURE;  Surgeon: [name], MD;  Location: [facility] ORS;  Service: General;  Laterality: N/A;  . COLON SURGERY    . COLONOSCOPY WITH PROPOFOL N/A 09/04/2017   Procedure: COLONOSCOPY WITH PROPOFOL;  Surgeon: Jonathon Bellows, MD;  Location: [facility] ENDOSCOPY;  Service: Gastroenterology;  Laterality: N/A;  . COLOSTOMY CLOSURE N/A 10/20/2017   Procedure: COLOSTOMY CLOSURE;  Surgeon: [name], MD;  Location: [facility] ORS;  Service: General;  Laterality: N/A;  . CYSTOSCOPY    . FLEXIBLE SIGMOIDOSCOPY N/A 02/06/2017   Procedure: FLEXIBLE SIGMOIDOSCOPY;  Surgeon: Christene Lye, MD;  Location: [facility] ENDOSCOPY;  Service: Endoscopy;   Laterality: N/A;  . PORTACATH PLACEMENT N/A 06/17/2018   Procedure: INSERTION PORT-A-CATH, WITH FLUOROSCOPY;  Surgeon: [name], MD;  Location: [facility] ORS;  Service: General;  Laterality: N/A;    Social History   Socioeconomic History  . Marital status: Married    Spouse name: Not on file  . Number of children: Not on file  . Years of education: Not on file  . Highest education level: Not on file  Occupational History  . Not on file  Tobacco Use  . Smoking status: Never Smoker  . Smokeless tobacco: Never Used  Vaping Use  . Vaping Use: Never used  Substance and Sexual Activity  . Alcohol use: No  . Drug use: No  . Sexual activity: Not Currently  Other Topics Concern  . Not on file  Social History Narrative  . Not on file   Social Determinants of Health   Financial Resource Strain: Not on file  Food Insecurity: Not on file  Transportation Needs: Not on file  Physical Activity: Not on file  Stress: Not on file  Social Connections: Not on file  Intimate Partner Violence: Not on file    Family History  Problem Relation Age of Onset  . Diabetes Mother   . AAA (abdominal aortic aneurysm) Mother   . Coronary artery disease Mother   . Diabetes Father   . Coronary artery disease Father   . Dementia Father   . Breast cancer Sister      Current Outpatient Medications:  .  acetaminophen (TYLENOL) 500 MG tablet, Take 1,000 mg by mouth in the morning  and at bedtime., Disp: , Rfl:  .  Apremilast (OTEZLA PO), Take 1 tablet by mouth daily., Disp: , Rfl:  .  aspirin EC 81 MG tablet, Take 81 mg by mouth daily., Disp: , Rfl:  .  clobetasol cream (TEMOVATE) 1.57 %, Apply 1 application topically 2 (two) times daily as needed., Disp: , Rfl:  .  Docusate Sodium (COLACE PO), Take 1 Dose by mouth as needed. (Patient not taking: No sig reported), Disp: , Rfl:  .  Dulaglutide 0.75 MG/0.5ML SOPN, Inject 1.5 mg into the skin once a week. , Disp: , Rfl:  .  glimepiride (AMARYL) 2 MG  tablet, Take 2 mg by mouth 2 (two) times daily before a meal. , Disp: , Rfl:  .  lidocaine-prilocaine (EMLA) cream, Place a small amount over cream over port 1 hour before each treatment, cover cream with saran wrap for clothing protection (Patient not taking: No sig reported), Disp: 30 g, Rfl: 1 .  loratadine (CLARITIN) 10 MG tablet, Take 10 mg by mouth daily., Disp: , Rfl:  .  losartan (COZAAR) 25 MG tablet, Take 25 mg by mouth daily., Disp: , Rfl:  .  metFORMIN (GLUCOPHAGE) 1000 MG tablet, Take 1,000-1,500 tablets by mouth See admin instructions. TAKE 1 TABLET (1000 MG) BY MOUTH IN THE MORNING & TAKE 1.5 TABLETS (1500 MG) BY MOUTH AT SUPPER, Disp: , Rfl:  .  ondansetron (ZOFRAN) 8 MG tablet, Take 1 tablet (8 mg total) by mouth every 8 (eight) hours as needed for nausea or vomiting. (Patient not taking: No sig reported), Disp: 20 tablet, Rfl: 0 .  ONETOUCH VERIO test strip, , Disp: , Rfl:  .  pravastatin (PRAVACHOL) 20 MG tablet, Take 20 mg by mouth daily with supper. , Disp: , Rfl:  .  pregabalin (LYRICA) 75 MG capsule, TAKE 1 CAPSULE BY MOUTH TWICE A DAY, Disp: 60 capsule, Rfl: 2 .  triamcinolone (KENALOG) 0.025 % cream, Apply 1 application topically daily as needed (FOR EZCEMA)., Disp: 30 g, Rfl: 2 No current facility-administered medications for this visit.  Facility-Administered Medications Ordered in Other Visits:  .  0.9 %  sodium chloride infusion, , Intravenous, Continuous, Sindy Guadeloupe, MD, Stopped at 07/27/18 1037 .  heparin lock flush 100 unit/mL, 500 Units, Intravenous, Once, Sindy Guadeloupe, MD .  heparin lock flush 100 unit/mL, 500 Units, Intravenous, Once, Sindy Guadeloupe, MD .  sodium chloride flush (NS) 0.9 % injection 10 mL, 10 mL, Intravenous, PRN, Sindy Guadeloupe, MD, 10 mL at 07/27/18 0920 .  sodium chloride flush (NS) 0.9 % injection 10 mL, 10 mL, Intravenous, Once, Randa Evens C, MD .  sodium chloride flush (NS) 0.9 % injection 10 mL, 10 mL, Intravenous, PRN, Sindy Guadeloupe, MD, 10 mL at 02/05/21 0815  Physical exam:  Vitals:   02/05/21 0847  BP: (!) 142/57  Pulse: 75  Resp: 16  Temp: (!) 97 F (36.1 C)  TempSrc: Oral  Weight: 172 lb 4.8 oz (78.2 kg)  Height: '5\' 6"'  (1.676 m)   Physical Exam Constitutional:      General: He is not in acute distress. Cardiovascular:     Rate and Rhythm: Normal rate and regular rhythm.     Heart sounds: Normal heart sounds.  Pulmonary:     Effort: Pulmonary effort is normal.     Breath sounds: Normal breath sounds.  Abdominal:     General: Bowel sounds are normal.     Palpations: Abdomen is soft.  Skin:    General: Skin is warm and dry.  Neurological:     Mental Status: He is alert and oriented to person, place, and time.      CMP Latest Ref Rng & Units 01/15/2021  Glucose 70 - 99 mg/dL 162(H)  BUN 8 - 23 mg/dL 20  Creatinine 0.61 - 1.24 mg/dL 0.94  Sodium 135 - 145 mmol/L 137  Potassium 3.5 - 5.1 mmol/L 4.3  Chloride 98 - 111 mmol/L 103  CO2 22 - 32 mmol/L 22  Calcium 8.9 - 10.3 mg/dL 9.1  Total Protein 6.5 - 8.1 g/dL 7.2  Total Bilirubin 0.3 - 1.2 mg/dL 0.7  Alkaline Phos 38 - 126 U/L 90  AST 15 - 41 U/L 49(H)  ALT 0 - 44 U/L 41   CBC Latest Ref Rng & Units 02/05/2021  WBC 4.0 - 10.5 K/uL 7.5  Hemoglobin 13.0 - 17.0 g/dL 10.1(L)  Hematocrit 39.0 - 52.0 % 33.6(L)  Platelets 150 - 400 K/uL 187     Assessment and plan- Patient is a 80 y.o. male with stage IV colon cancer and lung metastases. He has had progression on FOLFOXZirabevchemotherapy. He is here for on treatment assessment prior to cycle 25 of FOLFIRI Zirabev chemotherapy  Counts okay to proceed with cycle 25 of FOLFIRI Zirabev chemotherapy today.  Pump disconnect on day 3 and gets Udenyca.  I will see him in 4 weeks for cycle 26 since patient plans to go on a beach vacation.  Plan to repeat scans after 2 more cycles.  Out of the 4 index lung lesions he had there were 2 lesions which were growing.  If there is a continued growth on those  lesions I will consider radiation to those sites especially given his slow progression of cancer and absence of other sites of disease.  Chemo-induced peripheral neuropathy: Continue Lyrica  Chemo-induced peripheral neuropathy: Continue Lyrica   Visit Diagnosis 1. Encounter for monoclonal antibody treatment for malignancy   2. Encounter for antineoplastic chemotherapy   3. 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[place])   4. Chemotherapy-induced peripheral neuropathy ([place])      Dr. Randa Evens, MD, MPH [facility] [phone] 02/05/2021 8:46 AM

## 2021-02-05 NOTE — Progress Notes (Signed)
Pt says that he had a tooth come out right upper mouth. It did not hurt. He thinks the neuropathy is better when warm and hurts more with cold temperature He is going to beach and will need to change next appt to 4 weeks. No other concerns

## 2021-02-07 ENCOUNTER — Inpatient Hospital Stay: Payer: Medicare Other

## 2021-02-07 DIAGNOSIS — C189 Malignant neoplasm of colon, unspecified: Secondary | ICD-10-CM

## 2021-02-07 DIAGNOSIS — C7800 Secondary malignant neoplasm of unspecified lung: Secondary | ICD-10-CM

## 2021-02-07 DIAGNOSIS — Z5111 Encounter for antineoplastic chemotherapy: Secondary | ICD-10-CM | POA: Diagnosis not present

## 2021-02-07 MED ORDER — SODIUM CHLORIDE 0.9% FLUSH
10.0000 mL | INTRAVENOUS | Status: DC | PRN
  Administered 2021-02-07: 10 mL
  Filled 2021-02-07: qty 10

## 2021-02-07 MED ORDER — PEGFILGRASTIM-CBQV 6 MG/0.6ML ~~LOC~~ SOSY
6.0000 mg | PREFILLED_SYRINGE | Freq: Once | SUBCUTANEOUS | Status: AC
  Administered 2021-02-07: 6 mg via SUBCUTANEOUS
  Filled 2021-02-07: qty 0.6

## 2021-02-07 MED ORDER — HEPARIN SOD (PORK) LOCK FLUSH 100 UNIT/ML IV SOLN
INTRAVENOUS | Status: AC
  Filled 2021-02-07: qty 5

## 2021-02-07 MED ORDER — HEPARIN SOD (PORK) LOCK FLUSH 100 UNIT/ML IV SOLN
500.0000 [IU] | Freq: Once | INTRAVENOUS | Status: AC | PRN
  Administered 2021-02-07: 500 [IU]
  Filled 2021-02-07: qty 5

## 2021-03-03 ENCOUNTER — Other Ambulatory Visit: Payer: Self-pay | Admitting: *Deleted

## 2021-03-03 DIAGNOSIS — C7800 Secondary malignant neoplasm of unspecified lung: Secondary | ICD-10-CM

## 2021-03-05 ENCOUNTER — Encounter: Payer: Self-pay | Admitting: Oncology

## 2021-03-05 ENCOUNTER — Inpatient Hospital Stay: Payer: Medicare Other

## 2021-03-05 ENCOUNTER — Inpatient Hospital Stay: Payer: Medicare Other | Attending: Oncology | Admitting: Oncology

## 2021-03-05 VITALS — BP 158/66 | HR 72 | Temp 97.5°F | Resp 17

## 2021-03-05 VITALS — BP 141/56 | HR 71 | Temp 97.8°F | Resp 16 | Ht 66.0 in | Wt 171.6 lb

## 2021-03-05 DIAGNOSIS — G620 Drug-induced polyneuropathy: Secondary | ICD-10-CM | POA: Diagnosis not present

## 2021-03-05 DIAGNOSIS — C189 Malignant neoplasm of colon, unspecified: Secondary | ICD-10-CM | POA: Diagnosis not present

## 2021-03-05 DIAGNOSIS — Z79899 Other long term (current) drug therapy: Secondary | ICD-10-CM | POA: Diagnosis not present

## 2021-03-05 DIAGNOSIS — C7800 Secondary malignant neoplasm of unspecified lung: Secondary | ICD-10-CM | POA: Insufficient documentation

## 2021-03-05 DIAGNOSIS — Z5111 Encounter for antineoplastic chemotherapy: Secondary | ICD-10-CM | POA: Diagnosis not present

## 2021-03-05 DIAGNOSIS — C187 Malignant neoplasm of sigmoid colon: Secondary | ICD-10-CM | POA: Diagnosis present

## 2021-03-05 DIAGNOSIS — Z5189 Encounter for other specified aftercare: Secondary | ICD-10-CM | POA: Insufficient documentation

## 2021-03-05 DIAGNOSIS — T451X5A Adverse effect of antineoplastic and immunosuppressive drugs, initial encounter: Secondary | ICD-10-CM

## 2021-03-05 LAB — COMPREHENSIVE METABOLIC PANEL
ALT: 32 U/L (ref 0–44)
AST: 43 U/L — ABNORMAL HIGH (ref 15–41)
Albumin: 3.9 g/dL (ref 3.5–5.0)
Alkaline Phosphatase: 89 U/L (ref 38–126)
Anion gap: 11 (ref 5–15)
BUN: 26 mg/dL — ABNORMAL HIGH (ref 8–23)
CO2: 20 mmol/L — ABNORMAL LOW (ref 22–32)
Calcium: 9.1 mg/dL (ref 8.9–10.3)
Chloride: 106 mmol/L (ref 98–111)
Creatinine, Ser: 0.85 mg/dL (ref 0.61–1.24)
GFR, Estimated: >60 mL/min (ref >60)
Glucose, Bld: 198 mg/dL — ABNORMAL HIGH (ref 70–99)
Potassium: 4.6 mmol/L (ref 3.5–5.1)
Sodium: 137 mmol/L (ref 135–145)
Total Bilirubin: 0.5 mg/dL (ref 0.3–1.2)
Total Protein: 7.1 g/dL (ref 6.5–8.1)

## 2021-03-05 LAB — PROTEIN, URINE, RANDOM: Total Protein, Urine: 22 mg/dL

## 2021-03-05 LAB — CBC WITH DIFFERENTIAL/PLATELET
Abs Immature Granulocytes: 0.02 10*3/uL (ref 0.00–0.07)
Basophils Absolute: 0.1 10*3/uL (ref 0.0–0.1)
Basophils Relative: 1 %
Eosinophils Absolute: 0.2 10*3/uL (ref 0.0–0.5)
Eosinophils Relative: 3 %
HCT: 32.1 % — ABNORMAL LOW (ref 39.0–52.0)
Hemoglobin: 9.9 g/dL — ABNORMAL LOW (ref 13.0–17.0)
Immature Granulocytes: 0 %
Lymphocytes Relative: 23 %
Lymphs Abs: 1.5 10*3/uL (ref 0.7–4.0)
MCH: 27.5 pg (ref 26.0–34.0)
MCHC: 30.8 g/dL (ref 30.0–36.0)
MCV: 89.2 fL (ref 80.0–100.0)
Monocytes Absolute: 0.6 10*3/uL (ref 0.1–1.0)
Monocytes Relative: 9 %
Neutro Abs: 4.2 10*3/uL (ref 1.7–7.7)
Neutrophils Relative %: 64 %
Platelets: 224 10*3/uL (ref 150–400)
RBC: 3.6 MIL/uL — ABNORMAL LOW (ref 4.22–5.81)
RDW: 19.2 % — ABNORMAL HIGH (ref 11.5–15.5)
WBC: 6.6 10*3/uL (ref 4.0–10.5)
nRBC: 0 % (ref 0.0–0.2)

## 2021-03-05 MED ORDER — SODIUM CHLORIDE 0.9 % IV SOLN
2400.0000 mg/m2 | INTRAVENOUS | Status: DC
  Administered 2021-03-05: 4800 mg via INTRAVENOUS
  Filled 2021-03-05: qty 96

## 2021-03-05 MED ORDER — DEXAMETHASONE SODIUM PHOSPHATE 10 MG/ML IJ SOLN
10.0000 mg | Freq: Once | INTRAMUSCULAR | Status: AC
  Administered 2021-03-05: 10 mg via INTRAVENOUS
  Filled 2021-03-05: qty 1

## 2021-03-05 MED ORDER — SODIUM CHLORIDE 0.9 % IV SOLN
Freq: Once | INTRAVENOUS | Status: AC
  Filled 2021-03-05: qty 250

## 2021-03-05 MED ORDER — AMOXICILLIN-POT CLAVULANATE 875-125 MG PO TABS: 1.0000 | ORAL_TABLET | Freq: Two times a day (BID) | ORAL | 0 refills | Status: DC

## 2021-03-05 MED ORDER — ATROPINE SULFATE 1 MG/ML IJ SOLN
0.5000 mg | Freq: Once | INTRAMUSCULAR | Status: AC | PRN
  Administered 2021-03-05: 0.5 mg via INTRAVENOUS
  Filled 2021-03-05: qty 1

## 2021-03-05 MED ORDER — PALONOSETRON HCL INJECTION 0.25 MG/5ML
0.2500 mg | Freq: Once | INTRAVENOUS | Status: AC
  Administered 2021-03-05: 0.25 mg via INTRAVENOUS
  Filled 2021-03-05: qty 5

## 2021-03-05 MED ORDER — FLUOROURACIL CHEMO INJECTION 2.5 GM/50ML
400.0000 mg/m2 | Freq: Once | INTRAVENOUS | Status: AC
  Administered 2021-03-05: 800 mg via INTRAVENOUS
  Filled 2021-03-05: qty 16

## 2021-03-05 MED ORDER — SODIUM CHLORIDE 0.9 % IV SOLN
800.0000 mg | Freq: Once | INTRAVENOUS | Status: AC
  Administered 2021-03-05: 800 mg via INTRAVENOUS
  Filled 2021-03-05: qty 40

## 2021-03-05 MED ORDER — LEUCOVORIN CALCIUM INJECTION 350 MG: 400.0000 mg/m2 | Freq: Once | INTRAVENOUS | Status: DC

## 2021-03-05 MED ORDER — SODIUM CHLORIDE 0.9 % IV SOLN
150.0000 mg/m2 | Freq: Once | INTRAVENOUS | Status: AC
  Administered 2021-03-05: 300 mg via INTRAVENOUS
  Filled 2021-03-05: qty 15

## 2021-03-05 MED ORDER — IRINOTECAN HCL CHEMO INJECTION 100 MG/5ML: 150.0000 mg/m2 | Freq: Once | INTRAVENOUS | Status: DC

## 2021-03-05 NOTE — Patient Instructions (Signed)
[facility]  Discharge Instructions: Thank you for choosing [facility] to provide your oncology and hematology care.  If you have a lab appointment with the [facility], please go directly to the [facility] and check in at the registration area.  Wear comfortable clothing and clothing appropriate for easy access to any Portacath or PICC line.   We strive to give you quality time with your provider. You may need to reschedule your appointment if you arrive late (15 or more minutes).  Arriving late affects you and other patients whose appointments are after yours.  Also, if you miss three or more appointments without notifying the office, you may be dismissed from the clinic at the provider's discretion.      For prescription refill requests, have your pharmacy contact our office and allow 72 hours for refills to be completed.    Today you received the following chemotherapy and/or immunotherapy agents: Irinotecan, Leucovorin, Adrucil       To help prevent nausea and vomiting after your treatment, we encourage you to take your nausea medication as directed.  BELOW ARE SYMPTOMS THAT SHOULD BE REPORTED IMMEDIATELY: . *FEVER GREATER THAN 100.4 F (38 C) OR HIGHER . *CHILLS OR SWEATING . *NAUSEA AND VOMITING THAT IS NOT CONTROLLED WITH YOUR NAUSEA MEDICATION . *UNUSUAL SHORTNESS OF BREATH . *UNUSUAL BRUISING OR BLEEDING . *URINARY PROBLEMS (pain or burning when urinating, or frequent urination) . *BOWEL PROBLEMS (unusual diarrhea, constipation, pain near the anus) . TENDERNESS IN MOUTH AND THROAT WITH OR WITHOUT PRESENCE OF ULCERS (sore throat, sores in mouth, or a toothache) . UNUSUAL RASH, SWELLING OR PAIN  . UNUSUAL VAGINAL DISCHARGE OR ITCHING   Items with * indicate a potential emergency and should be followed up as soon as possible or go to the Emergency Department if any problems should occur.  Please show the CHEMOTHERAPY ALERT  CARD or IMMUNOTHERAPY ALERT CARD at check-in to the Emergency Department and triage nurse.  Should you have questions after your visit or need to cancel or reschedule your appointment, please contact [facility]  [phone] and follow the prompts.  Office hours are 8:00 a.m. to 4:30 p.m. Monday - Friday. Please note that voicemails left after 4:00 p.m. may not be returned until the following business day.  We are closed weekends and major holidays. You have access to a nurse at all times for urgent questions. Please call the main number to the clinic [phone] and follow the prompts.  For any non-urgent questions, you may also contact your provider using MyChart. We now offer e-Visits for anyone 82 and older to request care online for non-urgent symptoms. For details visit [email or web address].   Also download the MyChart app! Go to the app store, search "MyChart", open the app, select [facility], and log in with your MyChart username and password.  Due to Covid, a mask is required upon entering the hospital/clinic. If you do not have a mask, one will be given to you upon arrival. For doctor visits, patients may have 1 support person aged 61 or older with them. For treatment visits, patients cannot have anyone with them due to current Covid guidelines and our immunocompromised population.    Irinotecan injection What is this medicine? IRINOTECAN (ir in oh TEE kan ) is a chemotherapy drug. It is used to treat colon and rectal cancer. This medicine may be used for other purposes; ask your health care provider or pharmacist  if you have questions. COMMON BRAND NAME(S): Camptosar What should I tell my health care provider before I take this medicine? They need to know if you have any of these conditions:  dehydration  diarrhea  infection (especially a virus infection such as chickenpox, cold sores, or herpes)  liver disease  low blood counts, like  low white cell, platelet, or red cell counts  low levels of calcium, magnesium, or potassium in the blood  recent or ongoing radiation therapy  an unusual or allergic reaction to irinotecan, other medicines, foods, dyes, or preservatives  pregnant or trying to get pregnant  breast-feeding How should I use this medicine? This drug is given as an infusion into a vein. It is administered in a hospital or clinic by a specially trained health care professional. Talk to your pediatrician regarding the use of this medicine in children. Special care may be needed. Overdosage: If you think you have taken too much of this medicine contact a poison control center or emergency room at once. NOTE: This medicine is only for you. Do not share this medicine with others. What if I miss a dose? It is important not to miss your dose. Call your doctor or health care professional if you are unable to keep an appointment. What may interact with this medicine? Do not take this medicine with any of the following medications:  cobicistat  itraconazole This medicine may interact with the following medications:  antiviral medicines for HIV or AIDS  certain antibiotics like rifampin or rifabutin  certain medicines for fungal infections like ketoconazole, posaconazole, and voriconazole  certain medicines for seizures like carbamazepine, phenobarbital, phenotoin  clarithromycin  gemfibrozil  nefazodone  St. John's Wort This list may not describe all possible interactions. Give your health care provider a list of all the medicines, herbs, non-prescription drugs, or dietary supplements you use. Also tell them if you smoke, drink alcohol, or use illegal drugs. Some items may interact with your medicine. What should I watch for while using this medicine? Your condition will be monitored carefully while you are receiving this medicine. You will need important blood work done while you are taking this  medicine. This drug may make you feel generally unwell. This is not uncommon, as chemotherapy can affect healthy cells as well as cancer cells. Report any side effects. Continue your course of treatment even though you feel ill unless your doctor tells you to stop. In some cases, you may be given additional medicines to help with side effects. Follow all directions for their use. You may get drowsy or dizzy. Do not drive, use machinery, or do anything that needs mental alertness until you know how this medicine affects you. Do not stand or sit up quickly, especially if you are an older patient. This reduces the risk of dizzy or fainting spells. Call your health care professional for advice if you get a fever, chills, or sore throat, or other symptoms of a cold or flu. Do not treat yourself. This medicine decreases your body's ability to fight infections. Try to avoid being around people who are sick. Avoid taking products that contain aspirin, acetaminophen, ibuprofen, naproxen, or ketoprofen unless instructed by your doctor. These medicines may hide a fever. This medicine may increase your risk to bruise or bleed. Call your doctor or health care professional if you notice any unusual bleeding. Be careful brushing and flossing your teeth or using a toothpick because you may get an infection or bleed more easily. If you  have any dental work done, tell your dentist you are receiving this medicine. Do not become pregnant while taking this medicine or for 6 months after stopping it. Women should inform their health care professional if they wish to become pregnant or think they might be pregnant. Men should not father a child while taking this medicine and for 3 months after stopping it. There is potential for serious side effects to an unborn child. Talk to your health care professional for more information. Do not breast-feed an infant while taking this medicine or for 7 days after stopping it. This medicine  has caused ovarian failure in some women. This medicine may make it more difficult to get pregnant. Talk to your health care professional if you are concerned about your fertility. This medicine has caused decreased sperm counts in some men. This may make it more difficult to father a child. Talk to your health care professional if you are concerned about your fertility. What side effects may I notice from receiving this medicine? Side effects that you should report to your doctor or health care professional as soon as possible:  allergic reactions like skin rash, itching or hives, swelling of the face, lips, or tongue  chest pain  diarrhea  flushing, runny nose, sweating during infusion  low blood counts - this medicine may decrease the number of white blood cells, red blood cells and platelets. You may be at increased risk for infections and bleeding.  nausea, vomiting  pain, swelling, warmth in the leg  signs of decreased platelets or bleeding - bruising, pinpoint red spots on the skin, black, tarry stools, blood in the urine  signs of infection - fever or chills, cough, sore throat, pain or difficulty passing urine  signs of decreased red blood cells - unusually weak or tired, fainting spells, lightheadedness Side effects that usually do not require medical attention (report to your doctor or health care professional if they continue or are bothersome):  constipation  hair loss  headache  loss of appetite  mouth sores  stomach pain This list may not describe all possible side effects. Call your doctor for medical advice about side effects. You may report side effects to FDA at 1-800-FDA-1088. Where should I keep my medicine? This drug is given in a hospital or clinic and will not be stored at home. NOTE: This sheet is a summary. It may not cover all possible information. If you have questions about this medicine, talk to your doctor, pharmacist, or health care provider.   2021 Elsevier/Gold Standard (2019-09-27 17:46:13)   Leucovorin injection What is this medicine? LEUCOVORIN (loo koe VOR in) is used to prevent or treat the harmful effects of some medicines. This medicine is used to treat anemia caused by a low amount of folic acid in the body. It is also used with 5-fluorouracil (5-FU) to treat colon cancer. This medicine may be used for other purposes; ask your health care provider or pharmacist if you have questions. What should I tell my health care provider before I take this medicine? They need to know if you have any of these conditions:  anemia from low levels of vitamin B-12 in the blood  an unusual or allergic reaction to leucovorin, folic acid, other medicines, foods, dyes, or preservatives  pregnant or trying to get pregnant  breast-feeding How should I use this medicine? This medicine is for injection into a muscle or into a vein. It is given by a health care professional in a  hospital or clinic setting. Talk to your pediatrician regarding the use of this medicine in children. Special care may be needed. Overdosage: If you think you have taken too much of this medicine contact a poison control center or emergency room at once. NOTE: This medicine is only for you. Do not share this medicine with others. What if I miss a dose? This does not apply. What may interact with this medicine?  capecitabine  fluorouracil  phenobarbital  phenytoin  primidone  trimethoprim-sulfamethoxazole This list may not describe all possible interactions. Give your health care provider a list of all the medicines, herbs, non-prescription drugs, or dietary supplements you use. Also tell them if you smoke, drink alcohol, or use illegal drugs. Some items may interact with your medicine. What should I watch for while using this medicine? Your condition will be monitored carefully while you are receiving this medicine. This medicine may increase the side  effects of 5-fluorouracil, 5-FU. Tell your doctor or health care professional if you have diarrhea or mouth sores that do not get better or that get worse. What side effects may I notice from receiving this medicine? Side effects that you should report to your doctor or health care professional as soon as possible:  allergic reactions like skin rash, itching or hives, swelling of the face, lips, or tongue  breathing problems  fever, infection  mouth sores  unusual bleeding or bruising  unusually weak or tired Side effects that usually do not require medical attention (report to your doctor or health care professional if they continue or are bothersome):  constipation or diarrhea  loss of appetite  nausea, vomiting This list may not describe all possible side effects. Call your doctor for medical advice about side effects. You may report side effects to FDA at 1-800-FDA-1088. Where should I keep my medicine? This drug is given in a hospital or clinic and will not be stored at home. NOTE: This sheet is a summary. It may not cover all possible information. If you have questions about this medicine, talk to your doctor, pharmacist, or health care provider.  2021 Elsevier/Gold Standard (2008-05-02 16:50:29)   Fluorouracil, 5-FU injection What is this medicine? FLUOROURACIL, 5-FU (flure oh YOOR a sil) is a chemotherapy drug. It slows the growth of cancer cells. This medicine is used to treat many types of cancer like breast cancer, colon or rectal cancer, pancreatic cancer, and stomach cancer. This medicine may be used for other purposes; ask your health care provider or pharmacist if you have questions. COMMON BRAND NAME(S): Adrucil What should I tell my health care provider before I take this medicine? They need to know if you have any of these conditions:  blood disorders  dihydropyrimidine dehydrogenase (DPD) deficiency  infection (especially a virus infection such as  chickenpox, cold sores, or herpes)  kidney disease  liver disease  malnourished, poor nutrition  recent or ongoing radiation therapy  an unusual or allergic reaction to fluorouracil, other chemotherapy, other medicines, foods, dyes, or preservatives  pregnant or trying to get pregnant  breast-feeding How should I use this medicine? This drug is given as an infusion or injection into a vein. It is administered in a hospital or clinic by a specially trained health care professional. Talk to your pediatrician regarding the use of this medicine in children. Special care may be needed. Overdosage: If you think you have taken too much of this medicine contact a poison control center or emergency room at once. NOTE: This medicine  is only for you. Do not share this medicine with others. What if I miss a dose? It is important not to miss your dose. Call your doctor or health care professional if you are unable to keep an appointment. What may interact with this medicine? Do not take this medicine with any of the following medications:  live virus vaccines This medicine may also interact with the following medications:  medicines that treat or prevent blood clots like warfarin, enoxaparin, and dalteparin This list may not describe all possible interactions. Give your health care provider a list of all the medicines, herbs, non-prescription drugs, or dietary supplements you use. Also tell them if you smoke, drink alcohol, or use illegal drugs. Some items may interact with your medicine. What should I watch for while using this medicine? Visit your doctor for checks on your progress. This drug may make you feel generally unwell. This is not uncommon, as chemotherapy can affect healthy cells as well as cancer cells. Report any side effects. Continue your course of treatment even though you feel ill unless your doctor tells you to stop. In some cases, you may be given additional medicines to help  with side effects. Follow all directions for their use. Call your doctor or health care professional for advice if you get a fever, chills or sore throat, or other symptoms of a cold or flu. Do not treat yourself. This drug decreases your body's ability to fight infections. Try to avoid being around people who are sick. This medicine may increase your risk to bruise or bleed. Call your doctor or health care professional if you notice any unusual bleeding. Be careful brushing and flossing your teeth or using a toothpick because you may get an infection or bleed more easily. If you have any dental work done, tell your dentist you are receiving this medicine. Avoid taking products that contain aspirin, acetaminophen, ibuprofen, naproxen, or ketoprofen unless instructed by your doctor. These medicines may hide a fever. Do not become pregnant while taking this medicine. Women should inform their doctor if they wish to become pregnant or think they might be pregnant. There is a potential for serious side effects to an unborn child. Talk to your health care professional or pharmacist for more information. Do not breast-feed an infant while taking this medicine. Men should inform their doctor if they wish to father a child. This medicine may lower sperm counts. Do not treat diarrhea with over the counter products. Contact your doctor if you have diarrhea that lasts more than 2 days or if it is severe and watery. This medicine can make you more sensitive to the sun. Keep out of the sun. If you cannot avoid being in the sun, wear protective clothing and use sunscreen. Do not use sun lamps or tanning beds/booths. What side effects may I notice from receiving this medicine? Side effects that you should report to your doctor or health care professional as soon as possible:  allergic reactions like skin rash, itching or hives, swelling of the face, lips, or tongue  low blood counts - this medicine may decrease the  number of white blood cells, red blood cells and platelets. You may be at increased risk for infections and bleeding.  signs of infection - fever or chills, cough, sore throat, pain or difficulty passing urine  signs of decreased platelets or bleeding - bruising, pinpoint red spots on the skin, black, tarry stools, blood in the urine  signs of decreased red blood cells -  unusually weak or tired, fainting spells, lightheadedness  breathing problems  changes in vision  chest pain  mouth sores  nausea and vomiting  pain, swelling, redness at site where injected  pain, tingling, numbness in the hands or feet  redness, swelling, or sores on hands or feet  stomach pain  unusual bleeding Side effects that usually do not require medical attention (report to your doctor or health care professional if they continue or are bothersome):  changes in finger or toe nails  diarrhea  dry or itchy skin  hair loss  headache  loss of appetite  sensitivity of eyes to the light  stomach upset  unusually teary eyes This list may not describe all possible side effects. Call your doctor for medical advice about side effects. You may report side effects to FDA at 1-800-FDA-1088. Where should I keep my medicine? This drug is given in a hospital or clinic and will not be stored at home. NOTE: This sheet is a summary. It may not cover all possible information. If you have questions about this medicine, talk to your doctor, pharmacist, or health care provider.  2021 Elsevier/Gold Standard (2019-09-27 15:00:03)

## 2021-03-05 NOTE — Progress Notes (Signed)
Pt. Has tooth that broke 1/2 off and needs to be removed and pt has been off avastin for 4 weeks and he is going to dentist soon. 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

## 2021-03-05 NOTE — Progress Notes (Signed)
No Mvasi today, going for dental procedure.

## 2021-03-05 NOTE — Progress Notes (Signed)
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 At that time he had progressive abdominal distention and no bowel movement for about one week. CT abdomen showed an apple core lesion in the descending/sigmoid colon.  2. Patient underwent Hartmann's procedure for obstructing sigmoid colon mass on 02/06/2017. Preoperative CEA was 5.0  3. 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 It is 0 out of 15 lymph nodes were positive for malignancy. Perineural and lymphovascular invasion was present. Margins were negative.pT3N0. MMR stable.  4. Given that he had high risk stage II colon cancer including he presented with obstruction and has LVI and perineural invasion- adjuvant xeloda chemotherapy was recommended for 6 months.   5.Patient had evidence of hand foot syndrome after cycle 3. Cycle 4 delayed by 1 week. Topical urea cream prescribed  6. Patient completed 8 cycles of adjuvant xeloda in October 2018. polps seen in sigmoid and decending colon but negative for malignancy  7. Repeat Ct abdomen after 8 cycles showed no metastatic disease. 6 mm lung nodule noted in RLL  8.Patient had a repeat colonoscopy in October 2018 which showed polyps in his transverse and  descending colon which were taken out and were negative for high-grade dysplasia or malignancy. Patient subsequently underwent colostomy takedown procedure by Dr. Burt Knack  9. Repeat CT chest abdomen/ pelvis showed increase in the size of previously seen lung nodules largest being 1.4 cm consistent with metastatic disease. PET/CT showed mild uptake in 2 lung nodules. Others were not hypermetabolic. No other evidence of metastatic disease  10. Repeat lung biopsy consistent with colon adenocarcinoma. Comprehensive RAS panel testing showed mutant KRAS  11.Given the slow rate of growth of his colon cancer and desire to maintain his quality of life plan was to start chemotherapy after he gets a 38-monthbreak in summer. Repeat CT chest abdomen and pelvis in August 2019 showed mild increase in the size of his lung nodules. No new lung nodules are no new sites of distant metastatic disease.FOLFOX Avastin chemotherapy started on 06/22/2018.Patient completed 12 cycles of FOLFOX Avastin chemotherapy with continued response to his lung lesions. Oxaliplatin was dropped and patient is currently on 5-FU Avastin.He did not have mild progression of her disease in lung nodules and was switched to second line FOLFIRI Mvasi  12. NGSshowed no actionable mutations. PD-L1 less than 1%.No evidence of HER2 and NTRKmutations   Interval history-patient will be undergoing a dental procedure in 1 week for his broken tooth.  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  ECOG PS- 1 Pain scale- 0   Review of systems- Review of Systems  Constitutional: Positive for malaise/fatigue. Negative for chills, fever and weight loss.  HENT: Negative for congestion, ear discharge and nosebleeds.   Eyes: Negative for blurred vision.  Respiratory: Negative for cough, hemoptysis, sputum production, shortness of breath and wheezing.  Cardiovascular: Negative for chest pain,  palpitations, orthopnea and claudication.  Gastrointestinal: Negative for abdominal pain, blood in stool, constipation, diarrhea, heartburn, melena, nausea and vomiting.  Genitourinary: Negative for dysuria, flank pain, frequency, hematuria and urgency.  Musculoskeletal: Negative for back pain, joint pain and myalgias.  Skin: Negative for rash.  Neurological: Positive for sensory change (Peripheral neuropathy). Negative for dizziness, tingling, focal weakness, seizures, weakness and headaches.  Endo/Heme/Allergies: Does not bruise/bleed easily.  Psychiatric/Behavioral: Negative for depression and suicidal ideas. The patient does not have insomnia.       No Known Allergies   Past Medical History:  Diagnosis Date  . Cancer ([place])    skin cancer-nose w graft,also shoulder- basal cell per pt  . Cataract    r eye  . Chronic kidney disease    kidney stones 20 y ago per pt  . Colon cancer ([place]) 2018   Surgical resection and chemo tx's.  . Diabetes mellitus without complication ([place])   . Eczema   . History of kidney stones   . Hyperlipidemia   . Hypertension   . neuropathy from chemo   . Vertigo      Past Surgical History:  Procedure Laterality Date  . CATARACT EXTRACTION Left   . COLECTOMY WITH COLOSTOMY CREATION/HARTMANN PROCEDURE N/A 02/06/2017   Procedure: COLECTOMY WITH COLOSTOMY CREATION/HARTMANN PROCEDURE;  Surgeon: [name], MD;  Location: [facility] ORS;  Service: General;  Laterality: N/A;  . COLON SURGERY    . COLONOSCOPY WITH PROPOFOL N/A 09/04/2017   Procedure: COLONOSCOPY WITH PROPOFOL;  Surgeon: Jonathon Bellows, MD;  Location: [facility] ENDOSCOPY;  Service: Gastroenterology;  Laterality: N/A;  . COLOSTOMY CLOSURE N/A 10/20/2017   Procedure: COLOSTOMY CLOSURE;  Surgeon: [name], MD;  Location: [facility] ORS;  Service: General;  Laterality: N/A;  . CYSTOSCOPY    . FLEXIBLE SIGMOIDOSCOPY N/A 02/06/2017   Procedure: FLEXIBLE SIGMOIDOSCOPY;  Surgeon: Christene Lye,  MD;  Location: [facility] ENDOSCOPY;  Service: Endoscopy;  Laterality: N/A;  . PORTACATH PLACEMENT N/A 06/17/2018   Procedure: INSERTION PORT-A-CATH, WITH FLUOROSCOPY;  Surgeon: [name], MD;  Location: [facility] ORS;  Service: General;  Laterality: N/A;    Social History   Socioeconomic History  . Marital status: Married    Spouse name: Not on file  . Number of children: Not on file  . Years of education: Not on file  . Highest education level: Not on file  Occupational History  . Not on file  Tobacco Use  . Smoking status: Never Smoker  . Smokeless tobacco: Never Used  Vaping Use  . Vaping Use: Never used  Substance and Sexual Activity  . Alcohol use: No  . Drug use: No  . Sexual activity: Not Currently  Other Topics Concern  . Not on file  Social History Narrative  . Not on file   Social Determinants of Health   Financial Resource Strain: Not on file  Food Insecurity: Not on file  Transportation Needs: Not on file  Physical Activity: Not on file  Stress: Not on file  Social Connections: Not on file  Intimate Partner Violence: Not on file    Family History  Problem Relation Age of Onset  . Diabetes Mother   . AAA (abdominal aortic aneurysm) Mother   . Coronary artery disease Mother   . Diabetes Father   . Coronary artery disease Father   . Dementia Father   . Breast cancer Sister      Current Outpatient Medications:  .  acetaminophen (TYLENOL) 500 MG tablet, Take 1,000 mg by mouth in the morning and at bedtime., Disp: , Rfl:  .  amoxicillin-clavulanate (AUGMENTIN) 875-125 MG tablet, Take 1 tablet by mouth 2 (two) times daily., Disp: 10 tablet, Rfl: 0 .  Apremilast (OTEZLA PO), Take 1 tablet by mouth daily., Disp: , Rfl:  .  aspirin EC 81 MG tablet, Take 81 mg by mouth daily., Disp: , Rfl:  .  clobetasol cream (TEMOVATE) 6.94 %, Apply 1 application topically 2 (two) times daily as needed., Disp: , Rfl:  .  Docusate Sodium (COLACE PO), Take 1 Dose by mouth as  needed., Disp: , Rfl:  .  Dulaglutide 0.75 MG/0.5ML SOPN, Inject 1.5 mg into the skin once a week. , Disp: , Rfl:  .  glimepiride (AMARYL) 2 MG tablet, Take 2 mg by mouth 2 (two) times daily before a meal. , Disp: , Rfl:  .  loratadine (CLARITIN) 10 MG tablet, Take 10 mg by mouth daily., Disp: , Rfl:  .  losartan (COZAAR) 25 MG tablet, Take 25 mg by mouth daily., Disp: , Rfl:  .  metFORMIN (GLUCOPHAGE) 1000 MG tablet, Take 1,000-1,500 tablets by mouth See admin instructions. TAKE 1 TABLET (1000 MG) BY MOUTH IN THE MORNING & TAKE 1.5 TABLETS (1500 MG) BY MOUTH AT SUPPER, Disp: , Rfl:  .  ONETOUCH VERIO test strip, , Disp: , Rfl:  .  pravastatin (PRAVACHOL) 20 MG tablet, Take 20 mg by mouth daily with supper. , Disp: , Rfl:  .  pregabalin (LYRICA) 75 MG capsule, Take 1 capsule (75 mg total) by mouth 2 (two) times daily., Disp: 60 capsule, Rfl: 3 .  triamcinolone (KENALOG) 0.025 % cream, Apply 1 application topically daily as needed (FOR EZCEMA)., Disp: 30 g, Rfl: 2 .  lidocaine-prilocaine (EMLA) cream, Place a small amount over cream over port 1 hour before each treatment, cover cream with saran wrap for clothing protection (Patient not taking: No sig reported), Disp: 30 g, Rfl: 1 .  ondansetron (ZOFRAN) 8 MG tablet, Take 1 tablet (8 mg total) by mouth every 8 (eight) hours as needed for nausea or vomiting. (Patient not taking: No sig reported), Disp: 20 tablet, Rfl: 0 No current facility-administered medications for this visit.  Facility-Administered Medications Ordered in Other Visits:  .  0.9 %  sodium chloride infusion, , Intravenous, Continuous, Sindy Guadeloupe, MD, Stopped at 07/27/18 1037 .  atropine injection 0.5 mg, 0.5 mg, Intravenous, Once PRN, Sindy Guadeloupe, MD .  dexamethasone (DECADRON) injection 10 mg, 10 mg, Intravenous, Once, Sindy Guadeloupe, MD .  fluorouracil (ADRUCIL) 4,800 mg in sodium chloride 0.9 % 54 mL chemo infusion, 2,400 mg/m2 (Treatment Plan Recorded), Intravenous, 1 day  or 1 dose, Sindy Guadeloupe, MD .  fluorouracil (ADRUCIL) chemo injection 800 mg, 400 mg/m2 (Treatment Plan Recorded), Intravenous, Once, Sindy Guadeloupe, MD .  heparin lock flush 100 unit/mL, 500 Units, Intravenous, Once, Sindy Guadeloupe, MD .  irinotecan (CAMPTOSAR) 300 mg in sodium chloride 0.9 % 500 mL chemo infusion, 150 mg/m2 (Treatment Plan Recorded), Intravenous, Once, Sindy Guadeloupe, MD .  leucovorin 800 mg in sodium chloride 0.9 % 250 mL infusion, 800 mg, Intravenous, Once, Sindy Guadeloupe, MD .  palonosetron (ALOXI) injection 0.25 mg, 0.25 mg, Intravenous, Once, Sindy Guadeloupe, MD .  sodium chloride flush (NS) 0.9 % injection 10 mL, 10 mL, Intravenous, PRN, Sindy Guadeloupe, MD, 10 mL at 07/27/18 0920 .  sodium chloride flush (NS) 0.9 %  injection 10 mL, 10 mL, Intravenous, Once, Sindy Guadeloupe, MD  Physical exam:  Vitals:   03/05/21 0942  BP: (!) 141/56  Pulse: 71  Resp: 16  Temp: 97.8 F (36.6 C)  TempSrc: Oral  Weight: 171 lb 9.6 oz (77.8 kg)  Height: _0  (1.676 m)   Physical Exam Constitutional:      General: He is not in acute distress. Cardiovascular:     Rate and Rhythm: Normal rate and regular rhythm.     Heart sounds: Normal heart sounds.  Pulmonary:     Effort: Pulmonary effort is normal.     Breath sounds: Normal breath sounds.  Abdominal:     General: Bowel sounds are normal.     Palpations: Abdomen is soft.  Skin:    General: Skin is warm and dry.  Neurological:     Mental Status: He is alert and oriented to person, place, and time.      CMP Latest Ref Rng & Units 03/05/2021  Glucose 70 - 99 mg/dL 198(H)  BUN 8 - 23 mg/dL 26(H)  Creatinine 0.61 - 1.24 mg/dL 0.85  Sodium 135 - 145 mmol/L 137  Potassium 3.5 - 5.1 mmol/L 4.6  Chloride 98 - 111 mmol/L 106  CO2 22 - 32 mmol/L 20(L)  Calcium 8.9 - 10.3 mg/dL 9.1  Total Protein 6.5 - 8.1 g/dL 7.1  Total Bilirubin 0.3 - 1.2 mg/dL 0.5  Alkaline Phos 38 - 126 U/L 89  AST 15 - 41 U/L 43(H)  ALT 0 - 44 U/L 32    CBC Latest Ref Rng & Units 03/05/2021  WBC 4.0 - 10.5 K/uL 6.6  Hemoglobin 13.0 - 17.0 g/dL 9.9(L)  Hematocrit 39.0 - 52.0 % 32.1(L)  Platelets 150 - 400 K/uL 224      Assessment and plan- Patient is a 80 y.o. male with stage IV colon cancer and lung metastases. He has had progression on FOLFOXZirabevchemotherapy.  He is here for on treatment assessment prior to cycle 26 of FOLFIRI chemotherapy  Counts okay to proceed with cycle 26 of FOLFIRI chemotherapy today.  Pump disconnect on day 3 and gets Udenyca.  Repeat CT chest abdomen and pelvis with contrast in 2 weeks.  I will see him back in 3 weeks with CBC with differential CMP for FOLFIRI chemotherapy with or without Noah Charon  We will hold Noah Charon today since he is getting dental work in 1 week.  His last Noah Charon was 4 weeks ago.  I will also give him a 5-day prescription for Augmentin for his dental procedure and given that he will be 1 week after chemotherapy.  Chemo induced peripheral neuropathy: Continue Lyrica   Visit Diagnosis 1. 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[place])   2. Malignant neoplasm metastatic to lung, unspecified laterality ([place])   3. Encounter for antineoplastic chemotherapy   4. Chemotherapy-induced peripheral neuropathy ([place])      Dr. Randa Evens, MD, MPH [facility] [phone] 03/05/2021 9:24 AM

## 2021-03-06 LAB — CEA: CEA: 6.4 ng/mL — ABNORMAL HIGH (ref 0.0–4.7)

## 2021-03-07 ENCOUNTER — Inpatient Hospital Stay: Payer: Medicare Other

## 2021-03-07 ENCOUNTER — Other Ambulatory Visit: Payer: Self-pay

## 2021-03-07 VITALS — BP 152/59 | HR 72 | Temp 96.0°F | Resp 18

## 2021-03-07 DIAGNOSIS — C189 Malignant neoplasm of colon, unspecified: Secondary | ICD-10-CM

## 2021-03-07 DIAGNOSIS — Z5111 Encounter for antineoplastic chemotherapy: Secondary | ICD-10-CM | POA: Diagnosis not present

## 2021-03-07 DIAGNOSIS — C7800 Secondary malignant neoplasm of unspecified lung: Secondary | ICD-10-CM

## 2021-03-07 MED ORDER — HEPARIN SOD (PORK) LOCK FLUSH 100 UNIT/ML IV SOLN
INTRAVENOUS | Status: AC
  Filled 2021-03-07: qty 5

## 2021-03-07 MED ORDER — HEPARIN SOD (PORK) LOCK FLUSH 100 UNIT/ML IV SOLN
500.0000 [IU] | Freq: Once | INTRAVENOUS | Status: AC | PRN
  Administered 2021-03-07: 500 [IU]
  Filled 2021-03-07: qty 5

## 2021-03-07 MED ORDER — SODIUM CHLORIDE 0.9% FLUSH
10.0000 mL | INTRAVENOUS | Status: DC | PRN
  Administered 2021-03-07: 10 mL
  Filled 2021-03-07: qty 10

## 2021-03-07 MED ORDER — PEGFILGRASTIM-CBQV 6 MG/0.6ML ~~LOC~~ SOSY
6.0000 mg | PREFILLED_SYRINGE | Freq: Once | SUBCUTANEOUS | Status: AC
  Administered 2021-03-07: 6 mg via SUBCUTANEOUS
  Filled 2021-03-07: qty 0.6

## 2021-03-07 NOTE — Patient Instructions (Signed)
CANCER CENTER [facility] REGIONAL MEDICAL ONCOLOGY    Pegfilgrastim injection What is this medicine? PEGFILGRASTIM (PEG fil gra stim) is a long-acting granulocyte colony-stimulating factor that stimulates the growth of neutrophils, a type of Jenniferann Stuckert blood cell important in the body's fight against infection. It is used to reduce the incidence of fever and infection in patients with certain types of cancer who are receiving chemotherapy that affects the bone marrow, and to increase survival after being exposed to high doses of radiation. This medicine may be used for other purposes; ask your health care provider or pharmacist if you have questions. COMMON BRAND NAME(S): Rexene Edison, Ziextenzo What should I tell my health care provider before I take this medicine? They need to know if you have any of these conditions:  kidney disease  latex allergy  ongoing radiation therapy  sickle cell disease  skin reactions to acrylic adhesives (On-Body Injector only)  an unusual or allergic reaction to pegfilgrastim, filgrastim, other medicines, foods, dyes, or preservatives  pregnant or trying to get pregnant  breast-feeding How should I use this medicine? This medicine is for injection under the skin. If you get this medicine at home, you will be taught how to prepare and give the pre-filled syringe or how to use the On-body Injector. Refer to the patient Instructions for Use for detailed instructions. Use exactly as directed. Tell your healthcare provider immediately if you suspect that the On-body Injector may not have performed as intended or if you suspect the use of the On-body Injector resulted in a missed or partial dose. It is important that you put your used needles and syringes in a special sharps container. Do not put them in a trash can. If you do not have a sharps container, call your pharmacist or healthcare provider to get one. Talk to your pediatrician regarding  the use of this medicine in children. While this drug may be prescribed for selected conditions, precautions do apply. Overdosage: If you think you have taken too much of this medicine contact a poison control center or emergency room at once. NOTE: This medicine is only for you. Do not share this medicine with others. What if I miss a dose? It is important not to miss your dose. Call your doctor or health care professional if you miss your dose. If you miss a dose due to an On-body Injector failure or leakage, a new dose should be administered as soon as possible using a single prefilled syringe for manual use. What may interact with this medicine? Interactions have not been studied. This list may not describe all possible interactions. Give your health care provider a list of all the medicines, herbs, non-prescription drugs, or dietary supplements you use. Also tell them if you smoke, drink alcohol, or use illegal drugs. Some items may interact with your medicine. What should I watch for while using this medicine? Your condition will be monitored carefully while you are receiving this medicine. You may need blood work done while you are taking this medicine. Talk to your health care provider about your risk of cancer. You may be more at risk for certain types of cancer if you take this medicine. If you are going to need a MRI, CT scan, or other procedure, tell your doctor that you are using this medicine (On-Body Injector only). What side effects may I notice from receiving this medicine? Side effects that you should report to your doctor or health care professional as soon as possible:  allergic reactions (skin rash, itching or hives, swelling of the face, lips, or tongue)  back pain  dizziness  fever  pain, redness, or irritation at site where injected  pinpoint red spots on the skin  red or dark-brown urine  shortness of breath or breathing problems  stomach or side pain, or pain  at the shoulder  swelling  tiredness  trouble passing urine or change in the amount of urine  unusual bruising or bleeding Side effects that usually do not require medical attention (report to your doctor or health care professional if they continue or are bothersome):  bone pain  muscle pain This list may not describe all possible side effects. Call your doctor for medical advice about side effects. You may report side effects to FDA at 1-800-FDA-1088. Where should I keep my medicine? Keep out of the reach of children. If you are using this medicine at home, you will be instructed on how to store it. Throw away any unused medicine after the expiration date on the label. NOTE: This sheet is a summary. It may not cover all possible information. If you have questions about this medicine, talk to your doctor, pharmacist, or health care provider.  2021 Elsevier/Gold Standard (2019-11-18 13:20:51)     Discharge Instructions: Thank you for choosing [facility] to provide your oncology and hematology care.  If you have a lab appointment with the [facility], please go directly to the [facility] and check in at the registration area.  Wear comfortable clothing and clothing appropriate for easy access to any Portacath or PICC line.   We strive to give you quality time with your provider. You may need to reschedule your appointment if you arrive late (15 or more minutes).  Arriving late affects you and other patients whose appointments are after yours.  Also, if you miss three or more appointments without notifying the office, you may be dismissed from the clinic at the provider's discretion.      For prescription refill requests, have your pharmacy contact our office and allow 72 hours for refills to be completed.     To help prevent nausea and vomiting after your treatment, we encourage you to take your nausea medication as directed.  BELOW ARE SYMPTOMS THAT SHOULD BE  REPORTED IMMEDIATELY: . *FEVER GREATER THAN 100.4 F (38 C) OR HIGHER . *CHILLS OR SWEATING . *NAUSEA AND VOMITING THAT IS NOT CONTROLLED WITH YOUR NAUSEA MEDICATION . *UNUSUAL SHORTNESS OF BREATH . *UNUSUAL BRUISING OR BLEEDING . *URINARY PROBLEMS (pain or burning when urinating, or frequent urination) . *BOWEL PROBLEMS (unusual diarrhea, constipation, pain near the anus) . TENDERNESS IN MOUTH AND THROAT WITH OR WITHOUT PRESENCE OF ULCERS (sore throat, sores in mouth, or a toothache) . UNUSUAL RASH, SWELLING OR PAIN  . UNUSUAL VAGINAL DISCHARGE OR ITCHING   Items with * indicate a potential emergency and should be followed up as soon as possible or go to the Emergency Department if any problems should occur.  Please show the CHEMOTHERAPY ALERT CARD or IMMUNOTHERAPY ALERT CARD at check-in to the Emergency Department and triage nurse.  Should you have questions after your visit or need to cancel or reschedule your appointment, please contact [facility]  [phone] and follow the prompts.  Office hours are 8:00 a.m. to 4:30 p.m. Monday - Friday. Please note that voicemails left after 4:00 p.m. may not be returned until the following business day.  We are closed weekends and  major holidays. You have access to a nurse at all times for urgent questions. Please call the main number to the clinic [phone] and follow the prompts.  For any non-urgent questions, you may also contact your provider using MyChart. We now offer e-Visits for anyone 70 and older to request care online for non-urgent symptoms. For details visit [email or web address].   Also download the MyChart app! Go to the app store, search "MyChart", open the app, select [facility] [facility], and log in with your MyChart username and password.  Due to Covid, a mask is required upon entering the hospital/clinic. If you do not have a mask, one will be given to you upon arrival. For doctor visits, patients  may have 1 support person aged 59 or older with them. For treatment visits, patients cannot have anyone with them due to current Covid guidelines and our immunocompromised population.

## 2021-03-07 NOTE — Progress Notes (Signed)
Pump d/c and udenyca injection completed today. VSS.

## 2021-03-13 ENCOUNTER — Other Ambulatory Visit: Payer: Self-pay

## 2021-03-13 ENCOUNTER — Ambulatory Visit
Admission: RE | Admit: 2021-03-13 | Discharge: 2021-03-13 | Disposition: A | Discharge status: Home / Self Care | Payer: Medicare Other | Source: Ambulatory Visit | Attending: Oncology | Admitting: Oncology

## 2021-03-13 DIAGNOSIS — C7800 Secondary malignant neoplasm of unspecified lung: Secondary | ICD-10-CM | POA: Diagnosis present

## 2021-03-13 DIAGNOSIS — C189 Malignant neoplasm of colon, unspecified: Secondary | ICD-10-CM | POA: Insufficient documentation

## 2021-03-13 MED ORDER — IOHEXOL 300 MG/ML  SOLN
100.0000 mL | Freq: Once | INTRAMUSCULAR | Status: AC | PRN
  Administered 2021-03-13: 100 mL via INTRAVENOUS

## 2021-03-26 ENCOUNTER — Other Ambulatory Visit: Payer: Self-pay | Admitting: *Deleted

## 2021-03-26 ENCOUNTER — Inpatient Hospital Stay (HOSPITAL_BASED_OUTPATIENT_CLINIC_OR_DEPARTMENT_OTHER): Payer: Medicare Other | Admitting: Oncology

## 2021-03-26 ENCOUNTER — Inpatient Hospital Stay: Payer: Medicare Other | Attending: Oncology

## 2021-03-26 ENCOUNTER — Other Ambulatory Visit: Payer: Medicare Other

## 2021-03-26 ENCOUNTER — Encounter: Payer: Self-pay | Admitting: Oncology

## 2021-03-26 ENCOUNTER — Ambulatory Visit: Payer: Medicare Other

## 2021-03-26 ENCOUNTER — Inpatient Hospital Stay: Payer: Medicare Other

## 2021-03-26 ENCOUNTER — Ambulatory Visit: Payer: Medicare Other | Admitting: Oncology

## 2021-03-26 VITALS — BP 159/61 | HR 70 | Temp 97.8°F | Resp 16 | Ht 66.0 in | Wt 175.0 lb

## 2021-03-26 DIAGNOSIS — Z5112 Encounter for antineoplastic immunotherapy: Secondary | ICD-10-CM | POA: Insufficient documentation

## 2021-03-26 DIAGNOSIS — C7800 Secondary malignant neoplasm of unspecified lung: Secondary | ICD-10-CM | POA: Diagnosis not present

## 2021-03-26 DIAGNOSIS — Z5111 Encounter for antineoplastic chemotherapy: Secondary | ICD-10-CM

## 2021-03-26 DIAGNOSIS — C187 Malignant neoplasm of sigmoid colon: Secondary | ICD-10-CM | POA: Diagnosis present

## 2021-03-26 DIAGNOSIS — Z79899 Other long term (current) drug therapy: Secondary | ICD-10-CM | POA: Insufficient documentation

## 2021-03-26 DIAGNOSIS — C189 Malignant neoplasm of colon, unspecified: Secondary | ICD-10-CM | POA: Diagnosis not present

## 2021-03-26 DIAGNOSIS — T451X5A Adverse effect of antineoplastic and immunosuppressive drugs, initial encounter: Secondary | ICD-10-CM

## 2021-03-26 DIAGNOSIS — Z7189 Other specified counseling: Secondary | ICD-10-CM

## 2021-03-26 DIAGNOSIS — G620 Drug-induced polyneuropathy: Secondary | ICD-10-CM

## 2021-03-26 LAB — CBC WITH DIFFERENTIAL/PLATELET
Abs Immature Granulocytes: 0.02 10*3/uL (ref 0.00–0.07)
Basophils Absolute: 0.1 10*3/uL (ref 0.0–0.1)
Basophils Relative: 1 %
Eosinophils Absolute: 0.3 10*3/uL (ref 0.0–0.5)
Eosinophils Relative: 5 %
HCT: 32.8 % — ABNORMAL LOW (ref 39.0–52.0)
Hemoglobin: 9.9 g/dL — ABNORMAL LOW (ref 13.0–17.0)
Immature Granulocytes: 0 %
Lymphocytes Relative: 31 %
Lymphs Abs: 1.9 10*3/uL (ref 0.7–4.0)
MCH: 26.8 pg (ref 26.0–34.0)
MCHC: 30.2 g/dL (ref 30.0–36.0)
MCV: 88.6 fL (ref 80.0–100.0)
Monocytes Absolute: 0.6 10*3/uL (ref 0.1–1.0)
Monocytes Relative: 10 %
Neutro Abs: 3.2 10*3/uL (ref 1.7–7.7)
Neutrophils Relative %: 53 %
Platelets: 183 10*3/uL (ref 150–400)
RBC: 3.7 MIL/uL — ABNORMAL LOW (ref 4.22–5.81)
RDW: 18.6 % — ABNORMAL HIGH (ref 11.5–15.5)
WBC: 6.1 10*3/uL (ref 4.0–10.5)
nRBC: 0 % (ref 0.0–0.2)

## 2021-03-26 LAB — COMPREHENSIVE METABOLIC PANEL
ALT: 39 U/L (ref 0–44)
AST: 57 U/L — ABNORMAL HIGH (ref 15–41)
Albumin: 3.8 g/dL (ref 3.5–5.0)
Alkaline Phosphatase: 97 U/L (ref 38–126)
Anion gap: 11 (ref 5–15)
BUN: 17 mg/dL (ref 8–23)
CO2: 22 mmol/L (ref 22–32)
Calcium: 8.8 mg/dL — ABNORMAL LOW (ref 8.9–10.3)
Chloride: 102 mmol/L (ref 98–111)
Creatinine, Ser: 1.06 mg/dL (ref 0.61–1.24)
GFR, Estimated: >60 mL/min (ref >60)
Glucose, Bld: 189 mg/dL — ABNORMAL HIGH (ref 70–99)
Potassium: 4.2 mmol/L (ref 3.5–5.1)
Sodium: 135 mmol/L (ref 135–145)
Total Bilirubin: 0.4 mg/dL (ref 0.3–1.2)
Total Protein: 6.8 g/dL (ref 6.5–8.1)

## 2021-03-26 LAB — PROTEIN, URINE, RANDOM: Total Protein, Urine: 20 mg/dL

## 2021-03-26 MED ORDER — HEPARIN SOD (PORK) LOCK FLUSH 100 UNIT/ML IV SOLN
500.0000 [IU] | Freq: Once | INTRAVENOUS | Status: DC
  Filled 2021-03-26: qty 5

## 2021-03-26 MED ORDER — HEPARIN SOD (PORK) LOCK FLUSH 100 UNIT/ML IV SOLN
INTRAVENOUS | Status: AC
  Filled 2021-03-26: qty 5

## 2021-03-26 MED ORDER — SODIUM CHLORIDE 0.9 % IV SOLN
Freq: Once | INTRAVENOUS | Status: AC
  Filled 2021-03-26: qty 250

## 2021-03-26 MED ORDER — SODIUM CHLORIDE 0.9 % IV SOLN
2400.0000 mg/m2 | INTRAVENOUS | Status: DC
  Administered 2021-03-26: 4800 mg via INTRAVENOUS
  Filled 2021-03-26: qty 96

## 2021-03-26 MED ORDER — DEXAMETHASONE SODIUM PHOSPHATE 10 MG/ML IJ SOLN
10.0000 mg | Freq: Once | INTRAMUSCULAR | Status: AC
  Administered 2021-03-26: 10 mg via INTRAVENOUS
  Filled 2021-03-26: qty 1

## 2021-03-26 MED ORDER — ATROPINE SULFATE 1 MG/ML IJ SOLN
0.5000 mg | Freq: Once | INTRAMUSCULAR | Status: AC | PRN
  Administered 2021-03-26: 0.5 mg via INTRAVENOUS
  Filled 2021-03-26: qty 1

## 2021-03-26 MED ORDER — SODIUM CHLORIDE 0.9 % IV SOLN
150.0000 mg/m2 | Freq: Once | INTRAVENOUS | Status: AC
  Administered 2021-03-26: 300 mg via INTRAVENOUS
  Filled 2021-03-26: qty 15

## 2021-03-26 MED ORDER — LEUCOVORIN CALCIUM INJECTION 350 MG: 400.0000 mg/m2 | Freq: Once | INTRAVENOUS | Status: DC

## 2021-03-26 MED ORDER — IRINOTECAN HCL CHEMO INJECTION 100 MG/5ML: 150.0000 mg/m2 | Freq: Once | INTRAVENOUS | Status: DC

## 2021-03-26 MED ORDER — SODIUM CHLORIDE 0.9 % IV SOLN
5.0000 mg/kg | Freq: Once | INTRAVENOUS | Status: AC
  Administered 2021-03-26: 400 mg via INTRAVENOUS
  Filled 2021-03-26: qty 16

## 2021-03-26 MED ORDER — SODIUM CHLORIDE 0.9 % IV SOLN
800.0000 mg | Freq: Once | INTRAVENOUS | Status: AC
  Administered 2021-03-26: 800 mg via INTRAVENOUS
  Filled 2021-03-26: qty 40

## 2021-03-26 MED ORDER — PALONOSETRON HCL INJECTION 0.25 MG/5ML
0.2500 mg | Freq: Once | INTRAVENOUS | Status: AC
  Administered 2021-03-26: 0.25 mg via INTRAVENOUS
  Filled 2021-03-26: qty 5

## 2021-03-26 MED ORDER — SODIUM CHLORIDE 0.9% FLUSH
10.0000 mL | INTRAVENOUS | Status: DC | PRN
  Administered 2021-03-26: 10 mL via INTRAVENOUS
  Filled 2021-03-26: qty 10

## 2021-03-26 MED ORDER — FLUOROURACIL CHEMO INJECTION 2.5 GM/50ML
400.0000 mg/m2 | Freq: Once | INTRAVENOUS | Status: AC
  Administered 2021-03-26: 800 mg via INTRAVENOUS
  Filled 2021-03-26: qty 16

## 2021-03-26 NOTE — Progress Notes (Unsigned)
amb ref to  

## 2021-03-26 NOTE — Patient Instructions (Signed)
[facility]    Discharge Instructions: Thank you for choosing [facility] to provide your oncology and hematology care.  If you have a lab appointment with the [facility], please go directly to the [facility] and check in at the registration area.  Wear comfortable clothing and clothing appropriate for easy access to any Portacath or PICC line.   We strive to give you quality time with your provider. You may need to reschedule your appointment if you arrive late (15 or more minutes).  Arriving late affects you and other patients whose appointments are after yours.  Also, if you miss three or more appointments without notifying the office, you may be dismissed from the clinic at the provider's discretion.      For prescription refill requests, have your pharmacy contact our office and allow 72 hours for refills to be completed.    Today you received the following chemotherapy and/or immunotherapy agents - mvasi, 1fu, leucovorin, irinotecan   To help prevent nausea and vomiting after your treatment, we encourage you to take your nausea medication as directed.  BELOW ARE SYMPTOMS THAT SHOULD BE REPORTED IMMEDIATELY: . *FEVER GREATER THAN 100.4 F (38 C) OR HIGHER . *CHILLS OR SWEATING . *NAUSEA AND VOMITING THAT IS NOT CONTROLLED WITH YOUR NAUSEA MEDICATION . *UNUSUAL SHORTNESS OF BREATH . *UNUSUAL BRUISING OR BLEEDING . *URINARY PROBLEMS (pain or burning when urinating, or frequent urination) . *BOWEL PROBLEMS (unusual diarrhea, constipation, pain near the anus) . TENDERNESS IN MOUTH AND THROAT WITH OR WITHOUT PRESENCE OF ULCERS (sore throat, sores in mouth, or a toothache) . UNUSUAL RASH, SWELLING OR PAIN  . UNUSUAL VAGINAL DISCHARGE OR ITCHING   Items with * indicate a potential emergency and should be followed up as soon as possible or go to the Emergency Department if any problems should occur.  Please show the CHEMOTHERAPY  ALERT CARD or IMMUNOTHERAPY ALERT CARD at check-in to the Emergency Department and triage nurse.  Should you have questions after your visit or need to cancel or reschedule your appointment, please contact [facility]  [phone] and follow the prompts.  Office hours are 8:00 a.m. to 4:30 p.m. Monday - Friday. Please note that voicemails left after 4:00 p.m. may not be returned until the following business day.  We are closed weekends and major holidays. You have access to a nurse at all times for urgent questions. Please call the main number to the clinic [phone] and follow the prompts.  For any non-urgent questions, you may also contact your provider using MyChart. We now offer e-Visits for anyone 81 and older to request care online for non-urgent symptoms. For details visit [email or web address].   Also download the MyChart app! Go to the app store, search "MyChart", open the app, select [facility], and log in with your MyChart username and password.  Due to Covid, a mask is required upon entering the hospital/clinic. If you do not have a mask, one will be given to you upon arrival. For doctor visits, patients may have 1 support person aged 68 or older with them. For treatment visits, patients cannot have anyone with them due to current Covid guidelines and our immunocompromised population.   Palonosetron Injection What is this medicine? PALONOSETRON (pal oh NOE se tron) is used to prevent nausea and vomiting caused by chemotherapy. It also helps prevent delayed nausea and vomiting that may occur a few days after your treatment. This medicine may  be used for other purposes; ask your health care provider or pharmacist if you have questions. COMMON BRAND NAME(S): Aloxi What should I tell my health care provider before I take this medicine? They need to know if you have any of these conditions:  an unusual or allergic reaction to palonosetron,  dolasetron, granisetron, ondansetron, other medicines, foods, dyes, or preservatives  pregnant or trying to get pregnant  breast-feeding How should I use this medicine? This medicine is for infusion into a vein. It is given by a health care professional in a hospital or clinic setting. Talk to your pediatrician regarding the use of this medicine in children. While this drug may be prescribed for children as young as 1 month for selected conditions, precautions do apply. Overdosage: If you think you have taken too much of this medicine contact a poison control center or emergency room at once. NOTE: This medicine is only for you. Do not share this medicine with others. What if I miss a dose? This does not apply. What may interact with this medicine?  certain medicines for depression, anxiety, or psychotic disturbances  fentanyl  linezolid  MAOIs like Carbex, Eldepryl, Marplan, Nardil, and Parnate  methylene blue (injected into a vein)  tramadol This list may not describe all possible interactions. Give your health care provider a list of all the medicines, herbs, non-prescription drugs, or dietary supplements you use. Also tell them if you smoke, drink alcohol, or use illegal drugs. Some items may interact with your medicine. What should I watch for while using this medicine? Your condition will be monitored carefully while you are receiving this medicine. What side effects may I notice from receiving this medicine? Side effects that you should report to your doctor or health care professional as soon as possible:  allergic reactions like skin rash, itching or hives, swelling of the face, lips, or tongue  breathing problems  confusion  dizziness  fast, irregular heartbeat  fever and chills  loss of balance or coordination  seizures  sweating  swelling of the hands and feet  tremors  unusually weak or tired Side effects that usually do not require medical attention  (report to your doctor or health care professional if they continue or are bothersome):  constipation or diarrhea  headache This list may not describe all possible side effects. Call your doctor for medical advice about side effects. You may report side effects to FDA at 1-800-FDA-1088. Where should I keep my medicine? This drug is given in a hospital or clinic and will not be stored at home. NOTE: This sheet is a summary. It may not cover all possible information. If you have questions about this medicine, talk to your doctor, pharmacist, or health care provider.  2021 Elsevier/Gold Standard (2013-09-02 10:38:36)  Dexamethasone injection What is this medicine? DEXAMETHASONE (dex a METH a sone) is a corticosteroid. It is used to treat inflammation of the skin, joints, lungs, and other organs. Common conditions treated include asthma, allergies, and arthritis. It is also used for other conditions, like blood disorders and diseases of the adrenal glands. This medicine may be used for other purposes; ask your health care provider or pharmacist if you have questions. COMMON BRAND NAME(S): Decadron, DoubleDex, ReadySharp Dexamethasone, Simplist Dexamethasone, Solurex What should I tell my health care provider before I take this medicine? They need to know if you have any of these conditions:  Cushing's syndrome  diabetes  glaucoma  heart disease  high blood pressure  infection like  herpes, measles, tuberculosis, or chickenpox  kidney disease  liver disease  mental illness  myasthenia gravis  osteoporosis  previous heart attack  seizures  stomach or intestine problems  thyroid disease  an unusual or allergic reaction to dexamethasone, corticosteroids, other medicines, lactose, foods, dyes, or preservatives  pregnant or trying to get pregnant  breast-feeding How should I use this medicine? This medicine is for injection into a muscle, joint, lesion, soft tissue, or  vein. It is given by a health care professional in a hospital or clinic setting. Talk to your pediatrician regarding the use of this medicine in children. Special care may be needed. Overdosage: If you think you have taken too much of this medicine contact a poison control center or emergency room at once. NOTE: This medicine is only for you. Do not share this medicine with others. What if I miss a dose? This may not apply. If you are having a series of injections over a prolonged period, try not to miss an appointment. Call your doctor or health care professional to reschedule if you are unable to keep an appointment. What may interact with this medicine? Do not take this medicine with any of the following medications:  live virus vaccines This medicine may also interact with the following medications:  aminoglutethimide  amphotericin B  aspirin and aspirin-like medicines  certain antibiotics like erythromycin, clarithromycin, and troleandomycin  certain antivirals for HIV or hepatitis  certain medicines for seizures like carbamazepine, phenobarbital, phenytoin  certain medicines to treat myasthenia gravis  cholestyramine  cyclosporine  digoxin  diuretics  ephedrine  male hormones, like estrogen or progestins and birth control pills  insulin or other medicines for diabetes  isoniazid  ketoconazole  medicines that relax muscles for surgery  mifepristone  NSAIDs, medicines for pain and inflammation, like ibuprofen or naproxen  rifampin  skin tests for allergies  thalidomide  vaccines  warfarin This list may not describe all possible interactions. Give your health care provider a list of all the medicines, herbs, non-prescription drugs, or dietary supplements you use. Also tell them if you smoke, drink alcohol, or use illegal drugs. Some items may interact with your medicine. What should I watch for while using this medicine? Visit your health care  professional for regular checks on your progress. Tell your health care professional if your symptoms do not start to get better or if they get worse. Your condition will be monitored carefully while you are receiving this medicine. Wear a medical ID bracelet or chain. Carry a card that describes your disease and details of your medicine and dosage times. This medicine may increase your risk of getting an infection. Call your health care professional for advice if you get a fever, chills, or sore throat, or other symptoms of a cold or flu. Do not treat yourself. Try to avoid being around people who are sick. Call your health care professional if you are around anyone with measles, chickenpox, or if you develop sores or blisters that do not heal properly. If you are going to need surgery or other procedures, tell your doctor or health care professional that you have taken this medicine within the last 12 months. Ask your doctor or health care professional about your diet. You may need to lower the amount of salt you eat. This medicine may increase blood sugar. Ask your healthcare provider if changes in diet or medicines are needed if you have diabetes. What side effects may I notice from receiving this medicine?  Side effects that you should report to your doctor or health care professional as soon as possible:  allergic reactions like skin rash, itching or hives, swelling of the face, lips, or tongue  bloody or black, tarry stools  changes in emotions or moods  changes in vision  confusion, excitement, restlessness  depressed mood  eye pain  hallucinations  muscle weakness  severe or sudden stomach or belly pain  signs and symptoms of high blood sugar such as being more thirsty or hungry or having to urinate more than normal. You may also feel very tired or have blurry vision.  signs and symptoms of infection like fever; chills; cough; sore throat; pain or trouble passing  urine  swelling of ankles, feet  unusual bruising or bleeding  wounds that do not heal Side effects that usually do not require medical attention (report to your doctor or health care professional if they continue or are bothersome):  increased appetite  increased growth of face or body hair  headache  nausea, vomiting  pain, redness, or irritation at site where injected  skin problems, acne, thin and shiny skin  trouble sleeping  weight gain This list may not describe all possible side effects. Call your doctor for medical advice about side effects. You may report side effects to FDA at 1-800-FDA-1088. Where should I keep my medicine? This medicine is given in a hospital or clinic and will not be stored at home. NOTE: This sheet is a summary. It may not cover all possible information. If you have questions about this medicine, talk to your doctor, pharmacist, or health care provider.  2021 Elsevier/Gold Standard (2019-05-10 13:51:58)  Atropine injection What is this medicine? ATROPINE (A troe peen) can help treat many conditions. This medicine is used to reduce saliva and fluid in the respiratory tract during surgery. It is also used to treat insecticide or mushroom poisoning. It can be used in an emergency to treat a slow heartbeat. This medicine may be used for other purposes; ask your health care provider or pharmacist if you have questions. What should I tell my health care provider before I take this medicine? They need to know if you have any of these conditions:  closed-angle glaucoma  heart disease, or previous heart attack  kidney disease  prostate trouble  stomach obstruction  an unusual or allergic reaction to atropine, other medicines, foods, dyes, or preservatives  pregnant or trying to get pregnant  breast-feeding How should I use this medicine? This medicine is for injection into a muscle, vein, or under the skin. It is usually given by a health  care professional in a hospital or clinic setting. If you get this medicine at home, you will be taught how to prepare and give this medicine. Use exactly as directed. It should only be given by persons who have training in the signs and treatment of nerve agent or insecticide poisoning. It is important that you put your used needles and syringes in a special sharps container. Do not put them in a trash can. If you do not have a sharps container, call your pharmacist or healthcare provider to get one. Talk to your pediatrician regarding the use of this medicine in children. Special care may be needed. Overdosage: If you think you have taken too much of this medicine contact a poison control center or emergency room at once. NOTE: This medicine is only for you. Do not share this medicine with others. What if I miss a dose?  This does not apply. What may interact with this medicine?  atomoxetine  barbiturates, like phenobarbital  benztropine  donepezil  ephedra  galantamine  glutethimide  medicines for mental problems and psychotic disturbances  medicines for Parkinson's disease  pralidoxime  rivastigmine  some medicines for congestion, cold, or allergy  stimulant medicines for attention disorders, weight loss, or to stay awake  tacrine  tegaserod This list may not describe all possible interactions. Give your health care provider a list of all the medicines, herbs, non-prescription drugs, or dietary supplements you use. Also tell them if you smoke, drink alcohol, or use illegal drugs. Some items may interact with your medicine. What should I watch for while using this medicine? Side effects may occur even though you are no longer using this medicine. Contact your doctor or health care professional if you are still experiencing side effects after several days. You may get drowsy or dizzy. Do not drive, use machinery, or do anything that needs mental alertness until you know how  this medicine affects you. Do not stand or sit up quickly, especially if you are an older patient. This reduces the risk of dizzy or fainting spells. Alcohol may interfere with the effect of this medicine. Avoid alcoholic drinks. Your mouth may get dry. Chewing sugarless gum or sucking hard candy, and drinking plenty of water may help. Contact your doctor if the problem does not go away or is severe. This medicine may cause dry eyes and blurred vision. If you wear contact lenses you may feel some discomfort. Lubricating drops may help. See your eye doctor if the problem does not go away or is severe. Avoid extreme heat. This medicine can cause you to sweat less than normal. Your body temperature could increase to dangerous levels, which may lead to heat stroke. What side effects may I notice from receiving this medicine? Side effects that you should report to your doctor or health care professional as soon as possible:  allergic reactions like skin rash, itching or hives, swelling of the face, lips, or tongue  anxiety, nervousness  changes in vision  confusion  fast or slow heartbeat  feeling faint or lightheaded, falls  hallucinations  memory loss  redness, blistering, peeling or loosening of the skin, including inside the mouth  slurred speech  trouble passing urine or change in the amount of urine  unusually weak or tired  vomiting Side effects that usually do not require medical attention (report to your doctor or health care professional if they continue or are bothersome):  constipation  dry mouth  flushing or redness of face or skin within 15 to 20 minutes after the injection  nausea This list may not describe all possible side effects. Call your doctor for medical advice about side effects. You may report side effects to FDA at 1-800-FDA-1088. Where should I keep my medicine? Keep out of the reach of children. If you are using this medicine at home, you will be  instructed on how to store this medicine. Throw away any unused medicine after the expiration date on the label. NOTE: This sheet is a summary. It may not cover all possible information. If you have questions about this medicine, talk to your doctor, pharmacist, or health care provider.  2021 Elsevier/Gold Standard (2008-01-19 09:47:01)  Bevacizumab injection What is this medicine? BEVACIZUMAB (be va SIZ yoo mab) is a monoclonal antibody. It is used to treat many types of cancer. This medicine may be used for other purposes; ask your  health care provider or pharmacist if you have questions. COMMON BRAND NAME(S): Avastin, MVASI, Zirabev What should I tell my health care provider before I take this medicine? They need to know if you have any of these conditions:  diabetes  heart disease  high blood pressure  history of coughing up blood  prior anthracycline chemotherapy (e.g., doxorubicin, daunorubicin, epirubicin)  recent or ongoing radiation therapy  recent or planning to have surgery  stroke  an unusual or allergic reaction to bevacizumab, hamster proteins, mouse proteins, other medicines, foods, dyes, or preservatives  pregnant or trying to get pregnant  breast-feeding How should I use this medicine? This medicine is for infusion into a vein. It is given by a health care professional in a hospital or clinic setting. Talk to your pediatrician regarding the use of this medicine in children. Special care may be needed. Overdosage: If you think you have taken too much of this medicine contact a poison control center or emergency room at once. NOTE: This medicine is only for you. Do not share this medicine with others. What if I miss a dose? It is important not to miss your dose. Call your doctor or health care professional if you are unable to keep an appointment. What may interact with this medicine? Interactions are not expected. This list may not describe all possible  interactions. Give your health care provider a list of all the medicines, herbs, non-prescription drugs, or dietary supplements you use. Also tell them if you smoke, drink alcohol, or use illegal drugs. Some items may interact with your medicine. What should I watch for while using this medicine? Your condition will be monitored carefully while you are receiving this medicine. You will need important blood work and urine testing done while you are taking this medicine. This medicine may increase your risk to bruise or bleed. Call your doctor or health care professional if you notice any unusual bleeding. Before having surgery, talk to your health care provider to make sure it is ok. This drug can increase the risk of poor healing of your surgical site or wound. You will need to stop this drug for 28 days before surgery. After surgery, wait at least 28 days before restarting this drug. Make sure the surgical site or wound is healed enough before restarting this drug. Talk to your health care provider if questions. Do not become pregnant while taking this medicine or for 6 months after stopping it. Women should inform their doctor if they wish to become pregnant or think they might be pregnant. There is a potential for serious side effects to an unborn child. Talk to your health care professional or pharmacist for more information. Do not breast-feed an infant while taking this medicine and for 6 months after the last dose. This medicine has caused ovarian failure in some women. This medicine may interfere with the ability to have a child. You should talk to your doctor or health care professional if you are concerned about your fertility. What side effects may I notice from receiving this medicine? Side effects that you should report to your doctor or health care professional as soon as possible:  allergic reactions like skin rash, itching or hives, swelling of the face, lips, or tongue  chest pain or  chest tightness  chills  coughing up blood  high fever  seizures  severe constipation  signs and symptoms of bleeding such as bloody or black, tarry stools; red or dark-brown urine; spitting up blood or  brown material that looks like coffee grounds; red spots on the skin; unusual bruising or bleeding from the eye, gums, or nose  signs and symptoms of a blood clot such as breathing problems; chest pain; severe, sudden headache; pain, swelling, warmth in the leg  signs and symptoms of a stroke like changes in vision; confusion; trouble speaking or understanding; severe headaches; sudden numbness or weakness of the face, arm or leg; trouble walking; dizziness; loss of balance or coordination  stomach pain  sweating  swelling of legs or ankles  vomiting  weight gain Side effects that usually do not require medical attention (report to your doctor or health care professional if they continue or are bothersome):  back pain  changes in taste  decreased appetite  dry skin  nausea  tiredness This list may not describe all possible side effects. Call your doctor for medical advice about side effects. You may report side effects to FDA at 1-800-FDA-1088. Where should I keep my medicine? This drug is given in a hospital or clinic and will not be stored at home. NOTE: This sheet is a summary. It may not cover all possible information. If you have questions about this medicine, talk to your doctor, pharmacist, or health care provider.  2021 Elsevier/Gold Standard (2019-08-24 10:50:46)  Leucovorin injection What is this medicine? LEUCOVORIN (loo koe VOR in) is used to prevent or treat the harmful effects of some medicines. This medicine is used to treat anemia caused by a low amount of folic acid in the body. It is also used with 5-fluorouracil (5-FU) to treat colon cancer. This medicine may be used for other purposes; ask your health care provider or pharmacist if you have  questions. What should I tell my health care provider before I take this medicine? They need to know if you have any of these conditions:  anemia from low levels of vitamin B-12 in the blood  an unusual or allergic reaction to leucovorin, folic acid, other medicines, foods, dyes, or preservatives  pregnant or trying to get pregnant  breast-feeding How should I use this medicine? This medicine is for injection into a muscle or into a vein. It is given by a health care professional in a hospital or clinic setting. Talk to your pediatrician regarding the use of this medicine in children. Special care may be needed. Overdosage: If you think you have taken too much of this medicine contact a poison control center or emergency room at once. NOTE: This medicine is only for you. Do not share this medicine with others. What if I miss a dose? This does not apply. What may interact with this medicine?  capecitabine  fluorouracil  phenobarbital  phenytoin  primidone  trimethoprim-sulfamethoxazole This list may not describe all possible interactions. Give your health care provider a list of all the medicines, herbs, non-prescription drugs, or dietary supplements you use. Also tell them if you smoke, drink alcohol, or use illegal drugs. Some items may interact with your medicine. What should I watch for while using this medicine? Your condition will be monitored carefully while you are receiving this medicine. This medicine may increase the side effects of 5-fluorouracil, 5-FU. Tell your doctor or health care professional if you have diarrhea or mouth sores that do not get better or that get worse. What side effects may I notice from receiving this medicine? Side effects that you should report to your doctor or health care professional as soon as possible:  allergic reactions like skin rash,  itching or hives, swelling of the face, lips, or tongue  breathing problems  fever,  infection  mouth sores  unusual bleeding or bruising  unusually weak or tired Side effects that usually do not require medical attention (report to your doctor or health care professional if they continue or are bothersome):  constipation or diarrhea  loss of appetite  nausea, vomiting This list may not describe all possible side effects. Call your doctor for medical advice about side effects. You may report side effects to FDA at 1-800-FDA-1088. Where should I keep my medicine? This drug is given in a hospital or clinic and will not be stored at home. NOTE: This sheet is a summary. It may not cover all possible information. If you have questions about this medicine, talk to your doctor, pharmacist, or health care provider.  2021 [employer] Standard (2008-05-02 16:50:29)  Irinotecan injection What is this medicine? IRINOTECAN (ir in oh TEE kan ) is a chemotherapy drug. It is used to treat colon and rectal cancer. This medicine may be used for other purposes; ask your health care provider or pharmacist if you have questions. COMMON BRAND NAME(S): Camptosar What should I tell my health care provider before I take this medicine? They need to know if you have any of these conditions:  dehydration  diarrhea  infection (especially a virus infection such as chickenpox, cold sores, or herpes)  liver disease  low blood counts, like low Bradly Sangiovanni cell, platelet, or red cell counts  low levels of calcium, magnesium, or potassium in the blood  recent or ongoing radiation therapy  an unusual or allergic reaction to irinotecan, other medicines, foods, dyes, or preservatives  pregnant or trying to get pregnant  breast-feeding How should I use this medicine? This drug is given as an infusion into a vein. It is administered in a hospital or clinic by a specially trained health care professional. Talk to your pediatrician regarding the use of this medicine in children. Special care may  be needed. Overdosage: If you think you have taken too much of this medicine contact a poison control center or emergency room at once. NOTE: This medicine is only for you. Do not share this medicine with others. What if I miss a dose? It is important not to miss your dose. Call your doctor or health care professional if you are unable to keep an appointment. What may interact with this medicine? Do not take this medicine with any of the following medications:  cobicistat  itraconazole This medicine may interact with the following medications:  antiviral medicines for HIV or AIDS  certain antibiotics like rifampin or rifabutin  certain medicines for fungal infections like ketoconazole, posaconazole, and voriconazole  certain medicines for seizures like carbamazepine, phenobarbital, phenotoin  clarithromycin  gemfibrozil  nefazodone  St. John's Wort This list may not describe all possible interactions. Give your health care provider a list of all the medicines, herbs, non-prescription drugs, or dietary supplements you use. Also tell them if you smoke, drink alcohol, or use illegal drugs. Some items may interact with your medicine. What should I watch for while using this medicine? Your condition will be monitored carefully while you are receiving this medicine. You will need important blood work done while you are taking this medicine. This drug may make you feel generally unwell. This is not uncommon, as chemotherapy can affect healthy cells as well as cancer cells. Report any side effects. Continue your course of treatment even though you feel ill unless  your doctor tells you to stop. In some cases, you may be given additional medicines to help with side effects. Follow all directions for their use. You may get drowsy or dizzy. Do not drive, use machinery, or do anything that needs mental alertness until you know how this medicine affects you. Do not stand or sit up quickly,  especially if you are an older patient. This reduces the risk of dizzy or fainting spells. Call your health care professional for advice if you get a fever, chills, or sore throat, or other symptoms of a cold or flu. Do not treat yourself. This medicine decreases your body's ability to fight infections. Try to avoid being around people who are sick. Avoid taking products that contain aspirin, acetaminophen, ibuprofen, naproxen, or ketoprofen unless instructed by your doctor. These medicines may hide a fever. This medicine may increase your risk to bruise or bleed. Call your doctor or health care professional if you notice any unusual bleeding. Be careful brushing and flossing your teeth or using a toothpick because you may get an infection or bleed more easily. If you have any dental work done, tell your dentist you are receiving this medicine. Do not become pregnant while taking this medicine or for 6 months after stopping it. Women should inform their health care professional if they wish to become pregnant or think they might be pregnant. Men should not father a child while taking this medicine and for 3 months after stopping it. There is potential for serious side effects to an unborn child. Talk to your health care professional for more information. Do not breast-feed an infant while taking this medicine or for 7 days after stopping it. This medicine has caused ovarian failure in some women. This medicine may make it more difficult to get pregnant. Talk to your health care professional if you are concerned about your fertility. This medicine has caused decreased sperm counts in some men. This may make it more difficult to father a child. Talk to your health care professional if you are concerned about your fertility. What side effects may I notice from receiving this medicine? Side effects that you should report to your doctor or health care professional as soon as possible:  allergic reactions like  skin rash, itching or hives, swelling of the face, lips, or tongue  chest pain  diarrhea  flushing, runny nose, sweating during infusion  low blood counts - this medicine may decrease the number of Bradd Merlos blood cells, red blood cells and platelets. You may be at increased risk for infections and bleeding.  nausea, vomiting  pain, swelling, warmth in the leg  signs of decreased platelets or bleeding - bruising, pinpoint red spots on the skin, black, tarry stools, blood in the urine  signs of infection - fever or chills, cough, sore throat, pain or difficulty passing urine  signs of decreased red blood cells - unusually weak or tired, fainting spells, lightheadedness Side effects that usually do not require medical attention (report to your doctor or health care professional if they continue or are bothersome):  constipation  hair loss  headache  loss of appetite  mouth sores  stomach pain This list may not describe all possible side effects. Call your doctor for medical advice about side effects. You may report side effects to FDA at 1-800-FDA-1088. Where should I keep my medicine? This drug is given in a hospital or clinic and will not be stored at home. NOTE: This sheet is a summary. It  may not cover all possible information. If you have questions about this medicine, talk to your doctor, pharmacist, or health care provider.  2021 [employer]/Gold Standard (2019-09-27 17:46:13)  Fluorouracil, 5-FU injection What is this medicine? FLUOROURACIL, 5-FU (flure oh YOOR a sil) is a chemotherapy drug. It slows the growth of cancer cells. This medicine is used to treat many types of cancer like breast cancer, colon or rectal cancer, pancreatic cancer, and stomach cancer. This medicine may be used for other purposes; ask your health care provider or pharmacist if you have questions. COMMON BRAND NAME(S): Adrucil What should I tell my health care provider before I take this  medicine? They need to know if you have any of these conditions:  blood disorders  dihydropyrimidine dehydrogenase (DPD) deficiency  infection (especially a virus infection such as chickenpox, cold sores, or herpes)  kidney disease  liver disease  malnourished, poor nutrition  recent or ongoing radiation therapy  an unusual or allergic reaction to fluorouracil, other chemotherapy, other medicines, foods, dyes, or preservatives  pregnant or trying to get pregnant  breast-feeding How should I use this medicine? This drug is given as an infusion or injection into a vein. It is administered in a hospital or clinic by a specially trained health care professional. Talk to your pediatrician regarding the use of this medicine in children. Special care may be needed. Overdosage: If you think you have taken too much of this medicine contact a poison control center or emergency room at once. NOTE: This medicine is only for you. Do not share this medicine with others. What if I miss a dose? It is important not to miss your dose. Call your doctor or health care professional if you are unable to keep an appointment. What may interact with this medicine? Do not take this medicine with any of the following medications:  live virus vaccines This medicine may also interact with the following medications:  medicines that treat or prevent blood clots like warfarin, enoxaparin, and dalteparin This list may not describe all possible interactions. Give your health care provider a list of all the medicines, herbs, non-prescription drugs, or dietary supplements you use. Also tell them if you smoke, drink alcohol, or use illegal drugs. Some items may interact with your medicine. What should I watch for while using this medicine? Visit your doctor for checks on your progress. This drug may make you feel generally unwell. This is not uncommon, as chemotherapy can affect healthy cells as well as cancer  cells. Report any side effects. Continue your course of treatment even though you feel ill unless your doctor tells you to stop. In some cases, you may be given additional medicines to help with side effects. Follow all directions for their use. Call your doctor or health care professional for advice if you get a fever, chills or sore throat, or other symptoms of a cold or flu. Do not treat yourself. This drug decreases your body's ability to fight infections. Try to avoid being around people who are sick. This medicine may increase your risk to bruise or bleed. Call your doctor or health care professional if you notice any unusual bleeding. Be careful brushing and flossing your teeth or using a toothpick because you may get an infection or bleed more easily. If you have any dental work done, tell your dentist you are receiving this medicine. Avoid taking products that contain aspirin, acetaminophen, ibuprofen, naproxen, or ketoprofen unless instructed by your doctor. These medicines may hide a  fever. Do not become pregnant while taking this medicine. Women should inform their doctor if they wish to become pregnant or think they might be pregnant. There is a potential for serious side effects to an unborn child. Talk to your health care professional or pharmacist for more information. Do not breast-feed an infant while taking this medicine. Men should inform their doctor if they wish to father a child. This medicine may lower sperm counts. Do not treat diarrhea with over the counter products. Contact your doctor if you have diarrhea that lasts more than 2 days or if it is severe and watery. This medicine can make you more sensitive to the sun. Keep out of the sun. If you cannot avoid being in the sun, wear protective clothing and use sunscreen. Do not use sun lamps or tanning beds/booths. What side effects may I notice from receiving this medicine? Side effects that you should report to your doctor or  health care professional as soon as possible:  allergic reactions like skin rash, itching or hives, swelling of the face, lips, or tongue  low blood counts - this medicine may decrease the number of Rosmary Dionisio blood cells, red blood cells and platelets. You may be at increased risk for infections and bleeding.  signs of infection - fever or chills, cough, sore throat, pain or difficulty passing urine  signs of decreased platelets or bleeding - bruising, pinpoint red spots on the skin, black, tarry stools, blood in the urine  signs of decreased red blood cells - unusually weak or tired, fainting spells, lightheadedness  breathing problems  changes in vision  chest pain  mouth sores  nausea and vomiting  pain, swelling, redness at site where injected  pain, tingling, numbness in the hands or feet  redness, swelling, or sores on hands or feet  stomach pain  unusual bleeding Side effects that usually do not require medical attention (report to your doctor or health care professional if they continue or are bothersome):  changes in finger or toe nails  diarrhea  dry or itchy skin  hair loss  headache  loss of appetite  sensitivity of eyes to the light  stomach upset  unusually teary eyes This list may not describe all possible side effects. Call your doctor for medical advice about side effects. You may report side effects to FDA at 1-800-FDA-1088. Where should I keep my medicine? This drug is given in a hospital or clinic and will not be stored at home. NOTE: This sheet is a summary. It may not cover all possible information. If you have questions about this medicine, talk to your doctor, pharmacist, or health care provider.  2021 [employer] Standard (2019-09-27 15:00:03)

## 2021-03-26 NOTE — Progress Notes (Signed)
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 Does not cause pain he states

## 2021-03-26 NOTE — Addendum Note (Signed)
Addended by: Kern Alberta on: 03/26/2021 03:16 PM   Modules accepted: Orders

## 2021-03-26 NOTE — Progress Notes (Signed)
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 At that time he had progressive abdominal distention and no bowel movement for about one week. CT abdomen showed an apple core lesion in the descending/sigmoid colon.  2. Patient underwent Hartmann's procedure for obstructing sigmoid colon mass on 02/06/2017. Preoperative CEA was 5.0  3. 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 It is 0 out of 15 lymph nodes were positive for malignancy. Perineural and lymphovascular invasion was present. Margins were negative.pT3N0. MMR stable.  4. Given that he had high risk stage II colon cancer including he presented with obstruction and has LVI and perineural invasion- adjuvant xeloda chemotherapy was recommended for 6 months.   5.Patient had evidence of hand foot syndrome after cycle 3. Cycle 4 delayed by 1 week. Topical urea cream prescribed  6. Patient completed 8 cycles of adjuvant xeloda in October 2018. polps seen in sigmoid and decending colon but negative for malignancy  7. Repeat Ct abdomen after 8 cycles showed no metastatic disease. 6 mm lung nodule noted in RLL  8.Patient had a repeat colonoscopy in October 2018 which showed  polyps in his transverse and descending colon which were taken out and were negative for high-grade dysplasia or malignancy. Patient subsequently underwent colostomy takedown procedure by Dr. Burt Knack  9. Repeat CT chest abdomen/ pelvis showed increase in the size of previously seen lung nodules largest being 1.4 cm consistent with metastatic disease. PET/CT showed mild uptake in 2 lung nodules. Others were not hypermetabolic. No other evidence of metastatic disease  10. Repeat lung biopsy consistent with colon adenocarcinoma. Comprehensive RAS panel testing showed mutant KRAS  11.Given the slow rate of growth of his colon cancer and desire to maintain his quality of life plan was to start chemotherapy after he gets a 5-monthbreak in summer. Repeat CT chest abdomen and pelvis in August 2019 showed mild increase in the size of his lung nodules. No new lung nodules are no new sites of distant metastatic disease.FOLFOX Avastin chemotherapy started on 06/22/2018.Patient completed 12 cycles of FOLFOX Avastin chemotherapy with continued response to his lung lesions. Oxaliplatin was dropped and patient is currently on 5-FU Avastin.He did not have mild progression of her disease in lung nodules and was switched to second line FOLFIRI Mvasi  12. NGSshowed no actionable mutations. 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  He does have occasional pain and discomfort which is more during the winter months.  He is currently on Lyrica which keeps it under check but numbness still persists.  He has not had any falls.  He has tried acupuncture in the past which has not helped him  ECOG PS- 1 Pain scale- 0   Review of systems- Review of Systems  Constitutional: Positive for malaise/fatigue. Negative for chills, fever and weight loss.  HENT: Negative for congestion, ear  discharge and nosebleeds.   Eyes: Negative for blurred vision.  Respiratory: Negative for cough, hemoptysis, sputum production, shortness of breath and wheezing.   Cardiovascular: Negative for chest pain, palpitations, orthopnea and claudication.  Gastrointestinal: Negative for abdominal pain, blood in stool, constipation, diarrhea, heartburn, melena, nausea and vomiting.  Genitourinary: Negative for dysuria, flank pain, frequency, hematuria and urgency.  Musculoskeletal: Negative for back pain, joint pain and myalgias.  Skin: Negative for rash.  Neurological: Positive for sensory change (Peripheral neuropathy). Negative for dizziness, tingling, focal weakness, seizures, weakness and headaches.  Endo/Heme/Allergies: Does not bruise/bleed easily.  Psychiatric/Behavioral: Negative for depression and suicidal ideas. The patient does not have insomnia.       No Known Allergies   Past Medical History:  Diagnosis Date  . Cancer ([place])    skin cancer-nose w graft,also shoulder- basal cell per pt  . Cataract    r eye  . Chronic kidney disease    kidney stones 20 y ago per pt  . Colon cancer ([place]) 2018   Surgical resection and chemo tx's.  . Diabetes mellitus without complication ([place])   . Eczema   . History of kidney stones   . Hyperlipidemia   . Hypertension   . neuropathy from chemo   . Vertigo      Past Surgical History:  Procedure Laterality Date  . CATARACT EXTRACTION Left   . COLECTOMY WITH COLOSTOMY CREATION/HARTMANN PROCEDURE N/A 02/06/2017   Procedure: COLECTOMY WITH COLOSTOMY CREATION/HARTMANN PROCEDURE;  Surgeon: [name], MD;  Location: [facility] ORS;  Service: General;  Laterality: N/A;  . COLON SURGERY    . COLONOSCOPY WITH PROPOFOL N/A 09/04/2017   Procedure: COLONOSCOPY WITH PROPOFOL;  Surgeon: Jonathon Bellows, MD;  Location: [facility] ENDOSCOPY;  Service: Gastroenterology;  Laterality: N/A;  . COLOSTOMY CLOSURE N/A 10/20/2017   Procedure: COLOSTOMY CLOSURE;  Surgeon:  [name], MD;  Location: [facility] ORS;  Service: General;  Laterality: N/A;  . CYSTOSCOPY    . FLEXIBLE SIGMOIDOSCOPY N/A 02/06/2017   Procedure: FLEXIBLE SIGMOIDOSCOPY;  Surgeon: Christene Lye, MD;  Location: [facility] ENDOSCOPY;  Service: Endoscopy;  Laterality: N/A;  . PORTACATH PLACEMENT N/A 06/17/2018   Procedure: INSERTION PORT-A-CATH, WITH FLUOROSCOPY;  Surgeon: [name], MD;  Location: [facility] ORS;  Service: General;  Laterality: N/A;    Social History   Socioeconomic History  . Marital status: Married    Spouse name: Not on file  . Number of children: Not on file  . Years of education: Not on file  . Highest education level: Not on file  Occupational History  . Not on file  Tobacco Use  . Smoking status: Never Smoker  . Smokeless tobacco: Never Used  Vaping Use  . Vaping Use: Never used  Substance and Sexual Activity  . Alcohol use: No  . Drug use: No  . Sexual activity: Not Currently  Other Topics Concern  . Not on file  Social History Narrative  . Not on file   Social Determinants of Health   Financial Resource Strain: Not on file  Food Insecurity: Not on file  Transportation Needs: Not on file  Physical Activity: Not on file  Stress: Not on file  Social Connections: Not on file  Intimate Partner Violence: Not on file    Family History  Problem Relation Age of Onset  . Diabetes Mother   . AAA (abdominal aortic aneurysm)  Mother   . Coronary artery disease Mother   . Diabetes Father   . Coronary artery disease Father   . Dementia Father   . Breast cancer Sister      Current Outpatient Medications:  .  acetaminophen (TYLENOL) 500 MG tablet, Take 1,000 mg by mouth in the morning and at bedtime., Disp: , Rfl:  .  aspirin EC 81 MG tablet, Take 81 mg by mouth daily., Disp: , Rfl:  .  clobetasol cream (TEMOVATE) 5.94 %, Apply 1 application topically 2 (two) times daily as needed., Disp: , Rfl:  .  Docusate Sodium (COLACE PO), Take 1 Dose by  mouth as needed., Disp: , Rfl:  .  Dulaglutide 0.75 MG/0.5ML SOPN, Inject 1.5 mg into the skin once a week. , Disp: , Rfl:  .  glimepiride (AMARYL) 2 MG tablet, Take 2 mg by mouth 2 (two) times daily before a meal. , Disp: , Rfl:  .  loratadine (CLARITIN) 10 MG tablet, Take 10 mg by mouth daily., Disp: , Rfl:  .  losartan (COZAAR) 25 MG tablet, Take 25 mg by mouth daily., Disp: , Rfl:  .  metFORMIN (GLUCOPHAGE) 1000 MG tablet, Take 1,000-1,500 tablets by mouth See admin instructions. TAKE 1 TABLET (1000 MG) BY MOUTH IN THE MORNING & TAKE 1.5 TABLETS (1500 MG) BY MOUTH AT SUPPER, Disp: , Rfl:  .  ONETOUCH VERIO test strip, , Disp: , Rfl:  .  pravastatin (PRAVACHOL) 20 MG tablet, Take 20 mg by mouth daily with supper. , Disp: , Rfl:  .  pregabalin (LYRICA) 75 MG capsule, Take 1 capsule (75 mg total) by mouth 2 (two) times daily., Disp: 60 capsule, Rfl: 3 .  amoxicillin-clavulanate (AUGMENTIN) 875-125 MG tablet, Take 1 tablet by mouth 2 (two) times daily. (Patient not taking: Reported on 03/26/2021), Disp: 10 tablet, Rfl: 0 .  Apremilast (OTEZLA PO), Take 1 tablet by mouth daily. (Patient not taking: Reported on 03/26/2021), Disp: , Rfl:  .  lidocaine-prilocaine (EMLA) cream, Place a small amount over cream over port 1 hour before each treatment, cover cream with saran wrap for clothing protection (Patient not taking: Reported on 03/26/2021), Disp: 30 g, Rfl: 1 .  ondansetron (ZOFRAN) 8 MG tablet, Take 1 tablet (8 mg total) by mouth every 8 (eight) hours as needed for nausea or vomiting. (Patient not taking: No sig reported), Disp: 20 tablet, Rfl: 0 .  triamcinolone (KENALOG) 0.025 % cream, Apply 1 application topically daily as needed (FOR EZCEMA). (Patient not taking: Reported on 03/26/2021), Disp: 30 g, Rfl: 2 No current facility-administered medications for this visit.  Facility-Administered Medications Ordered in Other Visits:  .  0.9 %  sodium chloride infusion, , Intravenous, Continuous, Sindy Guadeloupe, MD, Stopped at 07/27/18 1037 .  bevacizumab-awwb (MVASI) 400 mg in sodium chloride 0.9 % 100 mL chemo infusion, 5 mg/kg (Treatment Plan Recorded), Intravenous, Once, Sindy Guadeloupe, MD .  fluorouracil (ADRUCIL) 4,800 mg in sodium chloride 0.9 % 54 mL chemo infusion, 2,400 mg/m2 (Treatment Plan Recorded), Intravenous, 1 day or 1 dose, Sindy Guadeloupe, MD .  fluorouracil (ADRUCIL) chemo injection 800 mg, 400 mg/m2 (Treatment Plan Recorded), Intravenous, Once, Sindy Guadeloupe, MD .  heparin lock flush 100 unit/mL, 500 Units, Intravenous, Once, Sindy Guadeloupe, MD .  heparin lock flush 100 unit/mL, 500 Units, Intravenous, Once, Sindy Guadeloupe, MD .  irinotecan (CAMPTOSAR) 300 mg in sodium chloride 0.9 % 500 mL chemo infusion, 150 mg/m2 (Treatment Plan Recorded), Intravenous, Once,  Sindy Guadeloupe, MD .  leucovorin 800 mg in sodium chloride 0.9 % 250 mL infusion, 800 mg, Intravenous, Once, Sindy Guadeloupe, MD .  sodium chloride flush (NS) 0.9 % injection 10 mL, 10 mL, Intravenous, PRN, Sindy Guadeloupe, MD, 10 mL at 07/27/18 0920 .  sodium chloride flush (NS) 0.9 % injection 10 mL, 10 mL, Intravenous, Once, Randa Evens C, MD .  sodium chloride flush (NS) 0.9 % injection 10 mL, 10 mL, Intravenous, PRN, Sindy Guadeloupe, MD, 10 mL at 03/26/21 0825  Physical exam:  Vitals:   03/26/21 0932  BP: (!) 159/61  Pulse: 70  Resp: 16  Temp: 97.8 F (36.6 C)  TempSrc: Oral  Weight: 175 lb (79.4 kg)  Height: _0  (1.676 m)   Physical Exam Constitutional:      General: He is not in acute distress. Cardiovascular:     Rate and Rhythm: Normal rate and regular rhythm.  Pulmonary:     Effort: Pulmonary effort is normal.  Skin:    General: Skin is warm and dry.  Neurological:     Mental Status: He is alert and oriented to person, place, and time.      CMP Latest Ref Rng & Units 03/26/2021  Glucose 70 - 99 mg/dL 189(H)  BUN 8 - 23 mg/dL 17  Creatinine 0.61 - 1.24 mg/dL 1.06  Sodium 135 - 145  mmol/L 135  Potassium 3.5 - 5.1 mmol/L 4.2  Chloride 98 - 111 mmol/L 102  CO2 22 - 32 mmol/L 22  Calcium 8.9 - 10.3 mg/dL 8.8(L)  Total Protein 6.5 - 8.1 g/dL 6.8  Total Bilirubin 0.3 - 1.2 mg/dL 0.4  Alkaline Phos 38 - 126 U/L 97  AST 15 - 41 U/L 57(H)  ALT 0 - 44 U/L 39   CBC Latest Ref Rng & Units 03/26/2021  WBC 4.0 - 10.5 K/uL 6.1  Hemoglobin 13.0 - 17.0 g/dL 9.9(L)  Hematocrit 39.0 - 52.0 % 32.8(L)  Platelets 150 - 400 K/uL 183    No images are attached to the encounter.  CT CHEST ABDOMEN PELVIS W CONTRAST  Result Date: 03/14/2021 CLINICAL DATA:  Metastatic colon cancer, assess treatment response EXAM: CT CHEST, ABDOMEN, AND PELVIS WITH CONTRAST TECHNIQUE: Multidetector CT imaging of the chest, abdomen and pelvis was performed following the standard protocol during bolus administration of intravenous contrast. CONTRAST:  143m OMNIPAQUE IOHEXOL 300 MG/ML SOLN, additional oral enteric contrast COMPARISON:  11/28/2020 FINDINGS: CT CHEST FINDINGS Cardiovascular: Left chest port catheter. Normal heart size. Three-vessel coronary artery calcifications. No pericardial effusion. Mediastinum/Nodes: No enlarged mediastinal, hilar, or axillary lymph nodes. Thyroid gland, trachea, and esophagus demonstrate no significant findings. Lungs/Pleura: Interval enlargement of a mass in the perihilar left upper lobe, measuring 3.9 x 2.6 cm, previously 2.9 x 2.2 cm when measured similarly (series 5, image 75). Interval enlargement of an irregular nodule of the peripheral left upper lobe, measuring 1.2 by 1.1 cm, previously 1.0 x 0.7 cm (series 5, image 61). Interval enlargement of a nodule of the lingula measuring 1.6 x 1.2 cm, previously 1.3 x 0.9 cm (series 5, image 84). Interval enlargement of a right lower lobe nodule measuring 1.4 x 1.3 cm, previously 1.2 x 1.0 cm (series 5, image 103). No pleural effusion or pneumothorax. Musculoskeletal: No chest wall mass or suspicious bone lesions identified. CT  ABDOMEN PELVIS FINDINGS Hepatobiliary: No solid liver abnormality is seen. Gallstones in the dependent gallbladder. No gallbladder wall thickening, or biliary dilatation. Pancreas: Unremarkable. No pancreatic  ductal dilatation or surrounding inflammatory changes. Spleen: Normal in size without significant abnormality. Adrenals/Urinary Tract: Adrenal glands are unremarkable. Kidneys are normal, without renal calculi, solid lesion, or hydronephrosis. Bladder is unremarkable. Stomach/Bowel: Stomach is within normal limits. Appendix appears normal. No evidence of bowel wall thickening, distention, or inflammatory changes. Status post proximal sigmoid colon resection and reanastomosis. Vascular/Lymphatic: Aortic atherosclerosis. No enlarged abdominal or pelvic lymph nodes. Reproductive: No mass or other abnormality. Other: No abdominal wall hernia or abnormality. No abdominopelvic ascites. Musculoskeletal: No acute or significant osseous findings. IMPRESSION: 1. Interval enlargement of a mass in the perihilar left upper lobe, measuring 3.9 x 2.6 cm, previously 2.9 x 2.2 cm when measured similarly. 2. Interval enlargement of multiple bilateral pulmonary nodules. 3. Findings are consistent with worsened pulmonary metastatic disease. 4. No evidence of metastatic disease in the abdomen or pelvis. 5. Status post proximal sigmoid colon resection and reanastomosis. 6. Cholelithiasis. 7. Coronary artery disease. Aortic Atherosclerosis (ICD10-I70.0). Electronically Signed   By: Eddie Candle M.D.   On: 03/14/2021 11:35     Assessment and plan- Patient is a 80 y.o. male with stage IV colon cancer and lung metastases. He has had progression on FOLFOXZirabevchemotherapy.  He is here for on treatment assessment prior to cycle 27 of FOLFIRI Zirabev chemotherapy and to discuss CT scan results and further management  I have reviewed CT chest abdomen pelvis images independently and have shown the images with the patient and his  sons.  There has been a slow continued growth in the size of his lung nodules.  3 index lung nodules are in the periphery but the main enlarging lung nodule is in the perihilar region which is increased from 2.9 cm to 3.9 cm presently.    Patient has known metastatic colon cancer to his lungs since 2018 and he has had slow continued increase in the size of lung nodules at least for the last 2 years.  I have plans to refer him to radiation oncology opinion for SBRT to the dominant perihilar region and continue to watch the other peripheral nodules and continue existing FOLFIRI Zirabev chemotherapy every 3 weeks which she is tolerating well without any significant side effects.  Dr. Donella Stade can decide down the line if any of the peripheral lung nodules need to be radiated as well.  Patient is receiving chemotherapy every 3 weeks for better tolerance and also to prevent worsening of his anemia.  He will see covering MD in 3 weeks for cycle 28.  Pump DC on day 3 and gets Udenyca.  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  Patient would like to hold off on that at this time but if his discomfort worsens he will let us know Visit Diagnosis 1. Encounter for antineoplastic chemotherapy   2. Encounter for monoclonal antibody treatment for malignancy   3. Goals of care, counseling/discussion   4. 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[place])   5. Chemotherapy-induced peripheral neuropathy ([place])      Dr. Randa Evens, MD, MPH [facility] [phone] 03/26/2021 9:54 AM

## 2021-03-27 LAB — CEA: CEA: 6.8 ng/mL — ABNORMAL HIGH (ref 0.0–4.7)

## 2021-03-28 ENCOUNTER — Inpatient Hospital Stay: Payer: Medicare Other

## 2021-03-28 ENCOUNTER — Encounter: Payer: Self-pay | Admitting: Radiation Oncology

## 2021-03-28 ENCOUNTER — Ambulatory Visit
Admission: RE | Admit: 2021-03-28 | Discharge: 2021-03-28 | Disposition: A | Discharge status: Home / Self Care | Payer: Medicare Other | Source: Ambulatory Visit | Attending: Radiation Oncology | Admitting: Radiation Oncology

## 2021-03-28 VITALS — BP 154/65 | HR 68 | Temp 96.8°F | Resp 16 | Wt 172.6 lb

## 2021-03-28 DIAGNOSIS — I129 Hypertensive chronic kidney disease with stage 1 through stage 4 chronic kidney disease, or unspecified chronic kidney disease: Secondary | ICD-10-CM | POA: Diagnosis not present

## 2021-03-28 DIAGNOSIS — G629 Polyneuropathy, unspecified: Secondary | ICD-10-CM | POA: Insufficient documentation

## 2021-03-28 DIAGNOSIS — C189 Malignant neoplasm of colon, unspecified: Secondary | ICD-10-CM

## 2021-03-28 DIAGNOSIS — C187 Malignant neoplasm of sigmoid colon: Secondary | ICD-10-CM | POA: Diagnosis not present

## 2021-03-28 DIAGNOSIS — E119 Type 2 diabetes mellitus without complications: Secondary | ICD-10-CM | POA: Diagnosis not present

## 2021-03-28 DIAGNOSIS — Z7982 Long term (current) use of aspirin: Secondary | ICD-10-CM | POA: Insufficient documentation

## 2021-03-28 DIAGNOSIS — Z79899 Other long term (current) drug therapy: Secondary | ICD-10-CM | POA: Insufficient documentation

## 2021-03-28 DIAGNOSIS — C7802 Secondary malignant neoplasm of left lung: Secondary | ICD-10-CM | POA: Insufficient documentation

## 2021-03-28 DIAGNOSIS — Z803 Family history of malignant neoplasm of breast: Secondary | ICD-10-CM | POA: Insufficient documentation

## 2021-03-28 DIAGNOSIS — C7800 Secondary malignant neoplasm of unspecified lung: Secondary | ICD-10-CM

## 2021-03-28 DIAGNOSIS — N189 Chronic kidney disease, unspecified: Secondary | ICD-10-CM | POA: Diagnosis not present

## 2021-03-28 DIAGNOSIS — Z7984 Long term (current) use of oral hypoglycemic drugs: Secondary | ICD-10-CM | POA: Insufficient documentation

## 2021-03-28 DIAGNOSIS — Z5111 Encounter for antineoplastic chemotherapy: Secondary | ICD-10-CM | POA: Diagnosis not present

## 2021-03-28 MED ORDER — HEPARIN SOD (PORK) LOCK FLUSH 100 UNIT/ML IV SOLN
500.0000 [IU] | Freq: Once | INTRAVENOUS | Status: AC | PRN
  Administered 2021-03-28: 500 [IU]
  Filled 2021-03-28: qty 5

## 2021-03-28 MED ORDER — PEGFILGRASTIM-CBQV 6 MG/0.6ML ~~LOC~~ SOSY
6.0000 mg | PREFILLED_SYRINGE | Freq: Once | SUBCUTANEOUS | Status: AC
  Administered 2021-03-28: 6 mg via SUBCUTANEOUS
  Filled 2021-03-28: qty 0.6

## 2021-03-28 MED ORDER — SODIUM CHLORIDE 0.9% FLUSH
10.0000 mL | INTRAVENOUS | Status: DC | PRN
  Administered 2021-03-28: 10 mL
  Filled 2021-03-28: qty 10

## 2021-03-28 MED ORDER — HEPARIN SOD (PORK) LOCK FLUSH 100 UNIT/ML IV SOLN
INTRAVENOUS | Status: AC
  Filled 2021-03-28: qty 5

## 2021-03-28 NOTE — Progress Notes (Signed)
Pt's pump disconnected and port deaccessed. Pt received udenyca injection. No complaints at d/c.

## 2021-03-28 NOTE — Patient Instructions (Signed)
[facility]    Discharge Instructions: Thank you for choosing [facility] to provide your oncology and hematology care.  If you have a lab appointment with the [facility], please go directly to the [facility] and check in at the registration area.  Wear comfortable clothing and clothing appropriate for easy access to any Portacath or PICC line.   We strive to give you quality time with your provider. You may need to reschedule your appointment if you arrive late (15 or more minutes).  Arriving late affects you and other patients whose appointments are after yours.  Also, if you miss three or more appointments without notifying the office, you may be dismissed from the clinic at the provider's discretion.      For prescription refill requests, have your pharmacy contact our office and allow 72 hours for refills to be completed.       To help prevent nausea and vomiting after your treatment, we encourage you to take your nausea medication as directed.  BELOW ARE SYMPTOMS THAT SHOULD BE REPORTED IMMEDIATELY: . *FEVER GREATER THAN 100.4 F (38 C) OR HIGHER . *CHILLS OR SWEATING . *NAUSEA AND VOMITING THAT IS NOT CONTROLLED WITH YOUR NAUSEA MEDICATION . *UNUSUAL SHORTNESS OF BREATH . *UNUSUAL BRUISING OR BLEEDING . *URINARY PROBLEMS (pain or burning when urinating, or frequent urination) . *BOWEL PROBLEMS (unusual diarrhea, constipation, pain near the anus) . TENDERNESS IN MOUTH AND THROAT WITH OR WITHOUT PRESENCE OF ULCERS (sore throat, sores in mouth, or a toothache) . UNUSUAL RASH, SWELLING OR PAIN  . UNUSUAL VAGINAL DISCHARGE OR ITCHING   Items with * indicate a potential emergency and should be followed up as soon as possible or go to the Emergency Department if any problems should occur.  Please show the CHEMOTHERAPY ALERT CARD or IMMUNOTHERAPY ALERT CARD at check-in to the Emergency Department and triage nurse.  Should you have  questions after your visit or need to cancel or reschedule your appointment, please contact [facility]  [phone] and follow the prompts.  Office hours are 8:00 a.m. to 4:30 p.m. Monday - Friday. Please note that voicemails left after 4:00 p.m. may not be returned until the following business day.  We are closed weekends and major holidays. You have access to a nurse at all times for urgent questions. Please call the main number to the clinic [phone] and follow the prompts.  For any non-urgent questions, you may also contact your provider using MyChart. We now offer e-Visits for anyone 52 and older to request care online for non-urgent symptoms. For details visit [email or web address].   Also download the MyChart app! Go to the app store, search "MyChart", open the app, select [facility], and log in with your MyChart username and password.  Due to Covid, a mask is required upon entering the hospital/clinic. If you do not have a mask, one will be given to you upon arrival. For doctor visits, patients may have 1 support person aged 59 or older with them. For treatment visits, patients cannot have anyone with them due to current Covid guidelines and our immunocompromised population.   Pegfilgrastim injection What is this medicine? PEGFILGRASTIM (PEG fil gra stim) is a long-acting granulocyte colony-stimulating factor that stimulates the growth of neutrophils, a type of Jeran Hiltz blood cell important in the body's fight against infection. It is used to reduce the incidence of fever and infection in patients with certain types of cancer who  are receiving chemotherapy that affects the bone marrow, and to increase survival after being exposed to high doses of radiation. This medicine may be used for other purposes; ask your health care provider or pharmacist if you have questions. COMMON BRAND NAME(S): Rexene Edison, Ziextenzo What should I  tell my health care provider before I take this medicine? They need to know if you have any of these conditions:  kidney disease  latex allergy  ongoing radiation therapy  sickle cell disease  skin reactions to acrylic adhesives (On-Body Injector only)  an unusual or allergic reaction to pegfilgrastim, filgrastim, other medicines, foods, dyes, or preservatives  pregnant or trying to get pregnant  breast-feeding How should I use this medicine? This medicine is for injection under the skin. If you get this medicine at home, you will be taught how to prepare and give the pre-filled syringe or how to use the On-body Injector. Refer to the patient Instructions for Use for detailed instructions. Use exactly as directed. Tell your healthcare provider immediately if you suspect that the On-body Injector may not have performed as intended or if you suspect the use of the On-body Injector resulted in a missed or partial dose. It is important that you put your used needles and syringes in a special sharps container. Do not put them in a trash can. If you do not have a sharps container, call your pharmacist or healthcare provider to get one. Talk to your pediatrician regarding the use of this medicine in children. While this drug may be prescribed for selected conditions, precautions do apply. Overdosage: If you think you have taken too much of this medicine contact a poison control center or emergency room at once. NOTE: This medicine is only for you. Do not share this medicine with others. What if I miss a dose? It is important not to miss your dose. Call your doctor or health care professional if you miss your dose. If you miss a dose due to an On-body Injector failure or leakage, a new dose should be administered as soon as possible using a single prefilled syringe for manual use. What may interact with this medicine? Interactions have not been studied. This list may not describe all possible  interactions. Give your health care provider a list of all the medicines, herbs, non-prescription drugs, or dietary supplements you use. Also tell them if you smoke, drink alcohol, or use illegal drugs. Some items may interact with your medicine. What should I watch for while using this medicine? Your condition will be monitored carefully while you are receiving this medicine. You may need blood work done while you are taking this medicine. Talk to your health care provider about your risk of cancer. You may be more at risk for certain types of cancer if you take this medicine. If you are going to need a MRI, CT scan, or other procedure, tell your doctor that you are using this medicine (On-Body Injector only). What side effects may I notice from receiving this medicine? Side effects that you should report to your doctor or health care professional as soon as possible:  allergic reactions (skin rash, itching or hives, swelling of the face, lips, or tongue)  back pain  dizziness  fever  pain, redness, or irritation at site where injected  pinpoint red spots on the skin  red or dark-brown urine  shortness of breath or breathing problems  stomach or side pain, or pain at the shoulder  swelling  tiredness  trouble passing urine or change in the amount of urine  unusual bruising or bleeding Side effects that usually do not require medical attention (report to your doctor or health care professional if they continue or are bothersome):  bone pain  muscle pain This list may not describe all possible side effects. Call your doctor for medical advice about side effects. You may report side effects to FDA at 1-800-FDA-1088. Where should I keep my medicine? Keep out of the reach of children. If you are using this medicine at home, you will be instructed on how to store it. Throw away any unused medicine after the expiration date on the label. NOTE: This sheet is a summary. It may not  cover all possible information. If you have questions about this medicine, talk to your doctor, pharmacist, or health care provider.  2021 Elsevier/Gold Standard (2019-11-18 13:20:51)

## 2021-03-28 NOTE — Consult Note (Signed)
NEW PATIENT EVALUATION  Name: Miguel Flores  MRN: [id number]  Date:   03/28/2021     DOB: 1941-04-26   This 80 y.o. 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  REFERRING PHYSICIAN: [name], MD  CHIEF COMPLAINT:  Chief Complaint  Patient presents with  . Lung Cancer    Initial consultation    DIAGNOSIS: The encounter diagnosis was Malignant neoplasm metastatic to lung, unspecified laterality ([place]).   PREVIOUS INVESTIGATIONS:  CT scans reviewed Pathology report reviewed Clinical notes reviewed  HPI: Patient is an 80 year old male initially presented with stage IIa adenocarcinoma the sigmoid colon status post resection and adjuvant Xeloda.  He is currently on FOLFIRI chemotherapy for progressive metastatic disease with multiple lung metastasis.  There has been slow progression of the lung metastasis although one in the left perihilar region has grown from 2.9 cm to 3.9 cm on consecutive CT scans.  He is asymptomatic specifically denies cough hemoptysis or chest tightness.  I been asked to medical by medical oncology evaluate the patient for possible palliative radiation therapy to his left perihilar region.  PLANNED TREATMENT REGIMEN: SBRT to left perihilar region  PAST MEDICAL HISTORY:  has a past medical history of Cancer ([place]), Cataract, Chronic kidney disease, Colon cancer ([place]) (2018), Diabetes mellitus without complication ([place]), Eczema, History of kidney stones, Hyperlipidemia, Hypertension, neuropathy from chemo, and Vertigo.    PAST SURGICAL HISTORY:  Past Surgical History:  Procedure Laterality Date  . CATARACT EXTRACTION Left   . COLECTOMY WITH COLOSTOMY CREATION/HARTMANN PROCEDURE N/A 02/06/2017   Procedure: COLECTOMY WITH COLOSTOMY CREATION/HARTMANN PROCEDURE;  Surgeon: [name], MD;  Location: [facility] ORS;  Service: General;   Laterality: N/A;  . COLON SURGERY    . COLONOSCOPY WITH PROPOFOL N/A 09/04/2017   Procedure: COLONOSCOPY WITH PROPOFOL;  Surgeon: Jonathon Bellows, MD;  Location[facility] ENDOSCOPY;  Service: Gastroenterology;  Laterality: N/A;  . COLOSTOMY CLOSURE N/A 10/20/2017   Procedure: COLOSTOMY CLOSURE;  Surgeon: [name], MD;  Location: [facility] ORS;  Service: General;  Laterality: N/A;  . CYSTOSCOPY    . FLEXIBLE SIGMOIDOSCOPY N/A 02/06/2017   Procedure: FLEXIBLE SIGMOIDOSCOPY;  Surgeon: Christene Lye, MD;  Location: [facility] ENDOSCOPY;  Service: Endoscopy;  Laterality: N/A;  . PORTACATH PLACEMENT N/A 06/17/2018   Procedure: INSERTION PORT-A-CATH, WITH FLUOROSCOPY;  Surgeon: [name], MD;  Location: [facility] ORS;  Service: General;  Laterality: N/A;    FAMILY HISTORY: family history includes AAA (abdominal aortic aneurysm) in his mother; Breast cancer in his sister; Coronary artery disease in his father and mother; Dementia in his father; Diabetes in his father and mother.  SOCIAL HISTORY:  reports that he has never smoked. He has never used smokeless tobacco. He reports that he does not drink alcohol and does not use drugs.  ALLERGIES: Patient has no known allergies.  MEDICATIONS:  Current Outpatient Medications  Medication Sig Dispense Refill  . acetaminophen (TYLENOL) 500 MG tablet Take 1,000 mg by mouth in the morning and at bedtime.    Marland Kitchen aspirin EC 81 MG tablet Take 81 mg by mouth daily.    . clobetasol cream (TEMOVATE) 2.42 % Apply 1 application topically 2 (two) times daily as needed.    Mariane Baumgarten Sodium (COLACE PO) Take 1 Dose by mouth as needed.    . Dulaglutide 0.75 MG/0.5ML SOPN Inject 1.5 mg into the skin once a week.     Marland Kitchen  glimepiride (AMARYL) 2 MG tablet Take 2 mg by mouth 2 (two) times daily before a meal.     . hydrOXYzine (ATARAX/VISTARIL) 25 MG tablet Take 25 mg by mouth daily.    Marland Kitchen loratadine (CLARITIN) 10 MG tablet Take 10 mg by mouth daily.    Marland Kitchen losartan (COZAAR) 25 MG  tablet Take 25 mg by mouth daily.    . metFORMIN (GLUCOPHAGE) 1000 MG tablet Take 1,000-1,500 tablets by mouth See admin instructions. TAKE 1 TABLET (1000 MG) BY MOUTH IN THE MORNING & TAKE 1.5 TABLETS (1500 MG) BY MOUTH AT SUPPER    . ONETOUCH VERIO test strip     . pravastatin (PRAVACHOL) 20 MG tablet Take 20 mg by mouth daily with supper.     . pregabalin (LYRICA) 75 MG capsule Take 1 capsule (75 mg total) by mouth 2 (two) times daily. 60 capsule 3  . Apremilast (OTEZLA PO) Take 1 tablet by mouth daily. (Patient not taking: No sig reported)    . lidocaine-prilocaine (EMLA) cream Place a small amount over cream over port 1 hour before each treatment, cover cream with saran wrap for clothing protection (Patient not taking: No sig reported) 30 g 1  . ondansetron (ZOFRAN) 8 MG tablet Take 1 tablet (8 mg total) by mouth every 8 (eight) hours as needed for nausea or vomiting. (Patient not taking: No sig reported) 20 tablet 0  . triamcinolone (KENALOG) 0.025 % cream Apply 1 application topically daily as needed (FOR EZCEMA). (Patient not taking: No sig reported) 30 g 2   No current facility-administered medications for this encounter.   Facility-Administered Medications Ordered in Other Encounters  Medication Dose Route Frequency Provider Last Rate Last Admin  . 0.9 %  sodium chloride infusion   Intravenous Continuous Sindy Guadeloupe, MD   Stopped at 07/27/18 1037  . heparin lock flush 100 unit/mL  500 Units Intravenous Once Sindy Guadeloupe, MD      . heparin lock flush 100 unit/mL  500 Units Intracatheter Once PRN Sindy Guadeloupe, MD      . pegfilgrastim-cbqv [facility]) injection 6 mg  6 mg Subcutaneous Once Sindy Guadeloupe, MD      . sodium chloride flush (NS) 0.9 % injection 10 mL  10 mL Intravenous PRN Sindy Guadeloupe, MD   10 mL at 07/27/18 0920  . sodium chloride flush (NS) 0.9 % injection 10 mL  10 mL Intravenous Once Sindy Guadeloupe, MD      . sodium chloride flush (NS) 0.9 % injection 10 mL  10 mL  Intracatheter PRN Sindy Guadeloupe, MD        ECOG PERFORMANCE STATUS:  0 - Asymptomatic  REVIEW OF SYSTEMS: Patient denies any weight loss, fatigue, weakness, fever, chills or night sweats. Patient denies any loss of vision, blurred vision. Patient denies any ringing  of the ears or hearing loss. No irregular heartbeat. Patient denies heart murmur or history of fainting. Patient denies any chest pain or pain radiating to her upper extremities. Patient denies any shortness of breath, difficulty breathing at night, cough or hemoptysis. Patient denies any swelling in the lower legs. Patient denies any nausea vomiting, vomiting of blood, or coffee ground material in the vomitus. Patient denies any stomach pain. Patient states has had normal bowel movements no significant constipation or diarrhea. Patient denies any dysuria, hematuria or significant nocturia. Patient denies any problems walking, swelling in the joints or loss of balance. Patient denies any skin changes, loss of  hair or loss of weight. Patient denies any excessive worrying or anxiety or significant depression. Patient denies any problems with insomnia. Patient denies excessive thirst, polyuria, polydipsia. Patient denies any swollen glands, patient denies easy bruising or easy bleeding. Patient denies any recent infections, allergies or URI. Patient "s visual fields have not changed significantly in recent time.   PHYSICAL EXAM: BP (!) 154/65 (BP Location: Right Arm, Patient Position: Sitting)   Pulse 68   Temp (!) 96.8 F (36 C) (Tympanic)   Resp 16   Wt 172 lb 9.6 oz (78.3 kg)   BMI 27.86 kg/m  Well-developed well-nourished patient in NAD. HEENT reveals PERLA, EOMI, discs not visualized.  Oral cavity is clear. No oral mucosal lesions are identified. Neck is clear without evidence of cervical or supraclavicular adenopathy. Lungs are clear to A&P. Cardiac examination is essentially unremarkable with regular rate and rhythm without murmur  rub or thrill. Abdomen is benign with no organomegaly or masses noted. Motor sensory and DTR levels are equal and symmetric in the upper and lower extremities. Cranial nerves II through XII are grossly intact. Proprioception is intact. No peripheral adenopathy or edema is identified. No motor or sensory levels are noted. Crude visual fields are within normal range.  LABORATORY DATA: Pathology report reviewed    RADIOLOGY RESULTS: Serial CT scans reviewed compatible with above-stated findings   IMPRESSION: Progressive stage IV colon cancer with lung metastasis and progression of left hilar mass in 80 year old male currently on FOLFIRI chemotherapy  PLAN: At this time would like to go through with SBRT to his left perihilar lesion.  We will try to do 60 Gray in 5 fractions.  Based on his close proximity to the mediastinum SBRT may be difficult.  We will know definitively at the time of simulation.  I have personally ordered CT simulation with 4-dimensional treatment planning and motion restriction.  Risks and benefits of treatment including possible development of a cough all were described to the patient and his son who is on the phone at the time of my evaluation.  Like to take this opportunity for allowing participate in the care of this patient.  Noreene Filbert, MD

## 2021-04-04 ENCOUNTER — Ambulatory Visit
Admission: RE | Admit: 2021-04-04 | Discharge: 2021-04-04 | Disposition: A | Discharge status: Home / Self Care | Payer: Medicare Other | Source: Ambulatory Visit | Attending: Radiation Oncology | Admitting: Radiation Oncology

## 2021-04-04 DIAGNOSIS — C187 Malignant neoplasm of sigmoid colon: Secondary | ICD-10-CM | POA: Diagnosis present

## 2021-04-04 DIAGNOSIS — C7802 Secondary malignant neoplasm of left lung: Secondary | ICD-10-CM | POA: Diagnosis present

## 2021-04-13 DIAGNOSIS — C187 Malignant neoplasm of sigmoid colon: Secondary | ICD-10-CM | POA: Insufficient documentation

## 2021-04-13 DIAGNOSIS — C7802 Secondary malignant neoplasm of left lung: Secondary | ICD-10-CM | POA: Insufficient documentation

## 2021-04-15 ENCOUNTER — Other Ambulatory Visit: Payer: Self-pay | Admitting: *Deleted

## 2021-04-15 DIAGNOSIS — C7800 Secondary malignant neoplasm of unspecified lung: Secondary | ICD-10-CM

## 2021-04-16 ENCOUNTER — Encounter: Payer: Self-pay | Admitting: Internal Medicine

## 2021-04-16 ENCOUNTER — Inpatient Hospital Stay: Payer: Medicare Other | Attending: Oncology

## 2021-04-16 ENCOUNTER — Inpatient Hospital Stay: Payer: Medicare Other

## 2021-04-16 ENCOUNTER — Inpatient Hospital Stay (HOSPITAL_BASED_OUTPATIENT_CLINIC_OR_DEPARTMENT_OTHER): Payer: Medicare Other | Admitting: Internal Medicine

## 2021-04-16 DIAGNOSIS — Z5111 Encounter for antineoplastic chemotherapy: Secondary | ICD-10-CM | POA: Insufficient documentation

## 2021-04-16 DIAGNOSIS — C189 Malignant neoplasm of colon, unspecified: Secondary | ICD-10-CM

## 2021-04-16 DIAGNOSIS — C7800 Secondary malignant neoplasm of unspecified lung: Secondary | ICD-10-CM

## 2021-04-16 DIAGNOSIS — Z5189 Encounter for other specified aftercare: Secondary | ICD-10-CM | POA: Insufficient documentation

## 2021-04-16 DIAGNOSIS — C187 Malignant neoplasm of sigmoid colon: Secondary | ICD-10-CM | POA: Insufficient documentation

## 2021-04-16 DIAGNOSIS — C186 Malignant neoplasm of descending colon: Secondary | ICD-10-CM

## 2021-04-16 DIAGNOSIS — Z5112 Encounter for antineoplastic immunotherapy: Secondary | ICD-10-CM | POA: Diagnosis present

## 2021-04-16 DIAGNOSIS — Z79899 Other long term (current) drug therapy: Secondary | ICD-10-CM | POA: Insufficient documentation

## 2021-04-16 LAB — CBC WITH DIFFERENTIAL/PLATELET
Abs Immature Granulocytes: 0.02 10*3/uL (ref 0.00–0.07)
Basophils Absolute: 0.1 10*3/uL (ref 0.0–0.1)
Basophils Relative: 1 %
Eosinophils Absolute: 0.2 10*3/uL (ref 0.0–0.5)
Eosinophils Relative: 3 %
HCT: 31.9 % — ABNORMAL LOW (ref 39.0–52.0)
Hemoglobin: 9.7 g/dL — ABNORMAL LOW (ref 13.0–17.0)
Immature Granulocytes: 0 %
Lymphocytes Relative: 26 %
Lymphs Abs: 1.8 10*3/uL (ref 0.7–4.0)
MCH: 26.5 pg (ref 26.0–34.0)
MCHC: 30.4 g/dL (ref 30.0–36.0)
MCV: 87.2 fL (ref 80.0–100.0)
Monocytes Absolute: 0.6 10*3/uL (ref 0.1–1.0)
Monocytes Relative: 9 %
Neutro Abs: 4.3 10*3/uL (ref 1.7–7.7)
Neutrophils Relative %: 61 %
Platelets: 205 10*3/uL (ref 150–400)
RBC: 3.66 MIL/uL — ABNORMAL LOW (ref 4.22–5.81)
RDW: 19.8 % — ABNORMAL HIGH (ref 11.5–15.5)
WBC: 7 10*3/uL (ref 4.0–10.5)
nRBC: 0 % (ref 0.0–0.2)

## 2021-04-16 LAB — PROTEIN, URINE, RANDOM: Total Protein, Urine: 9 mg/dL

## 2021-04-16 LAB — COMPREHENSIVE METABOLIC PANEL
ALT: 34 U/L (ref 0–44)
AST: 40 U/L (ref 15–41)
Albumin: 4 g/dL (ref 3.5–5.0)
Alkaline Phosphatase: 86 U/L (ref 38–126)
Anion gap: 13 (ref 5–15)
BUN: 19 mg/dL (ref 8–23)
CO2: 20 mmol/L — ABNORMAL LOW (ref 22–32)
Calcium: 9 mg/dL (ref 8.9–10.3)
Chloride: 103 mmol/L (ref 98–111)
Creatinine, Ser: 0.88 mg/dL (ref 0.61–1.24)
GFR, Estimated: >60 mL/min (ref >60)
Glucose, Bld: 149 mg/dL — ABNORMAL HIGH (ref 70–99)
Potassium: 4.8 mmol/L (ref 3.5–5.1)
Sodium: 136 mmol/L (ref 135–145)
Total Bilirubin: 0.5 mg/dL (ref 0.3–1.2)
Total Protein: 7 g/dL (ref 6.5–8.1)

## 2021-04-16 MED ORDER — SODIUM CHLORIDE 0.9 % IV SOLN
2400.0000 mg/m2 | INTRAVENOUS | Status: DC
  Administered 2021-04-16: 4800 mg via INTRAVENOUS
  Filled 2021-04-16: qty 96

## 2021-04-16 MED ORDER — SODIUM CHLORIDE 0.9 % IV SOLN
150.0000 mg/m2 | Freq: Once | INTRAVENOUS | Status: AC
  Administered 2021-04-16: 300 mg via INTRAVENOUS
  Filled 2021-04-16: qty 15

## 2021-04-16 MED ORDER — SODIUM CHLORIDE 0.9 % IV SOLN
800.0000 mg | Freq: Once | INTRAVENOUS | Status: AC
Start: 2021-04-16 — End: 2021-04-16
  Administered 2021-04-16: 800 mg via INTRAVENOUS
  Filled 2021-04-16: qty 25

## 2021-04-16 MED ORDER — ATROPINE SULFATE 1 MG/ML IJ SOLN
0.5000 mg | Freq: Once | INTRAMUSCULAR | Status: AC | PRN
  Administered 2021-04-16: 0.5 mg via INTRAVENOUS
  Filled 2021-04-16: qty 1

## 2021-04-16 MED ORDER — FLUOROURACIL CHEMO INJECTION 2.5 GM/50ML
400.0000 mg/m2 | Freq: Once | INTRAVENOUS | Status: AC
  Administered 2021-04-16: 800 mg via INTRAVENOUS
  Filled 2021-04-16: qty 16

## 2021-04-16 MED ORDER — PALONOSETRON HCL INJECTION 0.25 MG/5ML
0.2500 mg | Freq: Once | INTRAVENOUS | Status: AC
  Administered 2021-04-16: 0.25 mg via INTRAVENOUS
  Filled 2021-04-16: qty 5

## 2021-04-16 MED ORDER — DEXAMETHASONE SODIUM PHOSPHATE 10 MG/ML IJ SOLN
10.0000 mg | Freq: Once | INTRAMUSCULAR | Status: AC
  Administered 2021-04-16: 10 mg via INTRAVENOUS
  Filled 2021-04-16: qty 1

## 2021-04-16 MED ORDER — SODIUM CHLORIDE 0.9 % IV SOLN
Freq: Once | INTRAVENOUS | Status: AC
Start: 2021-04-16 — End: 2021-04-16
  Filled 2021-04-16: qty 250

## 2021-04-16 NOTE — Assessment & Plan Note (Addendum)
Patient is a 80 y.o. 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   #Proceed with FOLFIRI today.; .  Hold bev-see below; Labs today reviewed;  acceptable for treatment today. With [facility].   #Dental extraction plan-currently postponed to July 13th; hold Bev at this time.  # Lung nodules-enlarging evaluated by radiation oncology.  Plan SBRT 10Fx- starting 6/13.   # PN-2-prior oxaliplatin overall stable.  Continue Lyrica.    # DISPOSITION: note for procedure # chemo today; HOLD Avastin # follow up as planned- with Dr.rao in 3 weeks; chemo as planned-dr.B

## 2021-04-16 NOTE — Progress Notes (Signed)
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[name])   06/22/2018 - 08/04/2019 Chemotherapy   The patient had dexamethasone (DECADRON) 4 MG tablet, 8 mg, Oral, Daily, 1 of 1 cycle, Start date: 06/18/2018, End date: 06/22/2018 palonosetron (ALOXI) injection 0.25 mg, 0.25 mg, Intravenous,  Once, 25 of 25 cycles Administration: 0.25 mg (06/22/2018), 0.25 mg (07/06/2018), 0.25 mg (07/27/2018), 0.25 mg (08/10/2018), 0.25 mg (08/24/2018), 0.25 mg (09/07/2018), 0.25 mg (09/21/2018), 0.25 mg (10/12/2018), 0.25 mg (10/26/2018), 0.25 mg (11/09/2018), 0.25 mg (11/23/2018), 0.25 mg (12/07/2018), 0.25 mg (12/28/2018), 0.25 mg (01/18/2019), 0.25 mg (02/08/2019), 0.25 mg (03/22/2019), 0.25 mg (03/01/2019), 0.25 mg (04/12/2019), 0.25 mg (05/03/2019), 0.25 mg (05/17/2019), 0.25 mg (05/31/2019), 0.25 mg (06/14/2019), 0.25 mg (06/28/2019), 0.25 mg (07/19/2019), 0.25 mg (08/02/2019) pegfilgrastim-cbqv (UDENYCA) injection 6 mg, 6 mg, Subcutaneous, Once, 19 of 19 cycles Administration: 6 mg (07/29/2018), 6 mg (08/26/2018), 6 mg (09/09/2018), 6 mg (09/23/2018), 6 mg (10/14/2018), 6 mg (10/28/2018), 6 mg (11/11/2018), 6 mg (11/25/2018), 6 mg (12/09/2018), 6 mg (12/30/2018), 6 mg (03/03/2019), 6 mg (05/05/2019), 6 mg (05/19/2019), 6 mg (06/02/2019), 6 mg (06/16/2019), 6 mg (06/30/2019), 6 mg (07/21/2019), 6 mg (08/04/2019) bevacizumab (AVASTIN) 450 mg in sodium chloride 0.9 % 100 mL chemo infusion, 5 mg/kg = 450 mg, Intravenous,  Once, 21 of 21 cycles Administration: 450 mg (07/06/2018), 450 mg (07/27/2018), 450 mg (08/10/2018), 450 mg (08/24/2018), 450 mg (09/07/2018), 450 mg (09/21/2018), 450 mg (10/12/2018), 450 mg (10/26/2018), 450 mg (11/09/2018), 450 mg (11/23/2018), 450  mg (12/07/2018), 400 mg (12/28/2018), 400 mg (02/08/2019), 400 mg (03/22/2019), 400 mg (03/01/2019), 400 mg (04/12/2019), 400 mg (05/03/2019), 400 mg (05/17/2019), 400 mg (05/31/2019), 400 mg (06/14/2019) leucovorin 800 mg in dextrose 5 % 250 mL infusion, 808 mg, Intravenous,  Once, 12 of 12 cycles Administration: 800 mg (06/22/2018), 800 mg (07/06/2018), 800 mg (07/27/2018), 800 mg (08/10/2018), 800 mg (08/24/2018), 800 mg (09/07/2018), 800 mg (09/21/2018), 800 mg (10/12/2018), 800 mg (10/26/2018), 800 mg (11/09/2018), 800 mg (11/23/2018), 800 mg (12/07/2018) oxaliplatin (ELOXATIN) 170 mg in dextrose 5 % 500 mL chemo infusion, 85 mg/m2 = 170 mg, Intravenous,  Once, 12 of 12 cycles Dose modification: 65 mg/m2 (original dose 85 mg/m2, Cycle 6, Reason: Other (see comments), Comment: thrombocytopenia) Administration: 170 mg (06/22/2018), 170 mg (07/06/2018), 170 mg (07/27/2018), 170 mg (08/10/2018), 170 mg (08/24/2018), 130 mg (09/07/2018), 130 mg (09/21/2018), 130 mg (10/12/2018), 130 mg (10/26/2018), 130 mg (11/09/2018), 130 mg (11/23/2018), 130 mg (12/07/2018) fluorouracil (ADRUCIL) chemo injection 800 mg, 400 mg/m2 = 800 mg, Intravenous,  Once, 25 of 25 cycles Administration: 800 mg (06/22/2018), 800 mg (07/06/2018), 800 mg (07/27/2018), 800 mg (08/10/2018), 800 mg (08/24/2018), 800 mg (09/07/2018), 800 mg (09/21/2018), 800 mg (10/12/2018), 800 mg (10/26/2018), 800 mg (11/09/2018), 800 mg (11/23/2018), 800 mg (12/07/2018), 800 mg (12/28/2018), 800 mg (01/18/2019), 800 mg (02/08/2019), 800 mg (03/22/2019), 800 mg (03/01/2019), 800 mg (04/12/2019), 800 mg (05/03/2019), 800 mg (05/17/2019), 800 mg (05/31/2019), 800 mg (06/14/2019), 800 mg (06/28/2019), 800 mg (07/19/2019), 800 mg (08/02/2019) leucovorin injection 40 mg, 20 mg/m2 = 40 mg (100 % of original dose 20 mg/m2), Intravenous,  Once, 13 of 13 cycles Dose modification: 20 mg/m2 (original dose 20 mg/m2, Cycle 13, Reason: Other (see comments), Comment: iv push) Administration: 40 mg (12/28/2018),  40 mg  (01/18/2019), 40 mg (02/08/2019), 40 mg (03/22/2019), 40 mg (03/01/2019), 40 mg (04/12/2019), 40 mg (05/03/2019), 40 mg (05/17/2019), 40 mg (05/31/2019), 40 mg (06/14/2019), 40 mg (06/28/2019), 40 mg (07/19/2019), 40 mg (08/02/2019) fluorouracil (ADRUCIL) 4,850 mg in sodium chloride 0.9 % 53 mL chemo infusion, 2,400 mg/m2 = 4,850 mg, Intravenous, 1 Day/Dose, 25 of 25 cycles Administration: 4,850 mg (06/22/2018), 4,850 mg (07/06/2018), 4,850 mg (07/27/2018), 4,850 mg (08/10/2018), 4,850 mg (08/24/2018), 4,850 mg (09/07/2018), 4,850 mg (09/21/2018), 4,850 mg (10/12/2018), 4,850 mg (10/26/2018), 4,850 mg (11/09/2018), 4,850 mg (11/23/2018), 4,850 mg (12/07/2018), 4,850 mg (12/28/2018), 4,850 mg (01/18/2019), 4,850 mg (02/08/2019), 4,850 mg (03/22/2019), 4,850 mg (03/01/2019), 4,850 mg (04/12/2019), 4,850 mg (05/03/2019), 4,850 mg (05/17/2019), 4,850 mg (05/31/2019), 4,850 mg (06/14/2019), 4,850 mg (06/28/2019), 4,850 mg (07/19/2019), 4,850 mg (08/02/2019) bevacizumab-awwb (MVASI) 400 mg in sodium chloride 0.9 % 100 mL chemo infusion, 450 mg (100 % of original dose 5 mg/kg), Intravenous,  Once, 3 of 3 cycles Dose modification: 5 mg/kg (original dose 5 mg/kg, Cycle 23, Reason: Other (see comments), Comment: biosimilar) Administration: 400 mg (06/28/2019), 400 mg (07/19/2019), 400 mg (08/02/2019)  for chemotherapy treatment.    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[place])   06/22/2018 - 08/04/2019 Chemotherapy   The patient had dexamethasone (DECADRON) 4 MG tablet, 8 mg, Oral, Daily, 1 of 1 cycle, Start date: 06/18/2018, End date: 06/22/2018 palonosetron (ALOXI) injection 0.25 mg, 0.25 mg, Intravenous,  Once, 25 of 25 cycles Administration: 0.25 mg (06/22/2018), 0.25 mg (07/06/2018), 0.25 mg (07/27/2018), 0.25 mg (08/10/2018), 0.25 mg (08/24/2018), 0.25 mg (09/07/2018), 0.25 mg (09/21/2018), 0.25 mg (10/12/2018), 0.25 mg  (10/26/2018), 0.25 mg (11/09/2018), 0.25 mg (11/23/2018), 0.25 mg (12/07/2018), 0.25 mg (12/28/2018), 0.25 mg (01/18/2019), 0.25 mg (02/08/2019), 0.25 mg (03/22/2019), 0.25 mg (03/01/2019), 0.25 mg (04/12/2019), 0.25 mg (05/03/2019), 0.25 mg (05/17/2019), 0.25 mg (05/31/2019), 0.25 mg (06/14/2019), 0.25 mg (06/28/2019), 0.25 mg (07/19/2019), 0.25 mg (08/02/2019) pegfilgrastim-cbqv (UDENYCA) injection 6 mg, 6 mg, Subcutaneous, Once, 19 of 19 cycles Administration: 6 mg (07/29/2018), 6 mg (08/26/2018), 6 mg (09/09/2018), 6 mg (09/23/2018), 6 mg (10/14/2018), 6 mg (10/28/2018), 6 mg (11/11/2018), 6 mg (11/25/2018), 6 mg (12/09/2018), 6 mg (12/30/2018), 6 mg (03/03/2019), 6 mg (05/05/2019), 6 mg (05/19/2019), 6 mg (06/02/2019), 6 mg (06/16/2019), 6 mg (06/30/2019), 6 mg (07/21/2019), 6 mg (08/04/2019) bevacizumab (AVASTIN) 450 mg in sodium chloride 0.9 % 100 mL chemo infusion, 5 mg/kg = 450 mg, Intravenous,  Once, 21 of 21 cycles Administration: 450 mg (07/06/2018), 450 mg (07/27/2018), 450 mg (08/10/2018), 450 mg (08/24/2018), 450 mg (09/07/2018), 450 mg (09/21/2018), 450 mg (10/12/2018), 450 mg (10/26/2018), 450 mg (11/09/2018), 450 mg (11/23/2018), 450 mg (12/07/2018), 400 mg (12/28/2018), 400 mg (02/08/2019), 400 mg (03/22/2019), 400 mg (03/01/2019), 400 mg (04/12/2019), 400 mg (05/03/2019), 400 mg (05/17/2019), 400 mg (05/31/2019), 400 mg (06/14/2019) leucovorin 800 mg in dextrose 5 % 250 mL infusion, 808 mg, Intravenous,  Once, 12 of 12 cycles Administration: 800 mg (06/22/2018), 800 mg (07/06/2018), 800 mg (07/27/2018), 800 mg (08/10/2018), 800 mg (08/24/2018), 800 mg (09/07/2018), 800 mg (09/21/2018), 800 mg (10/12/2018), 800 mg (10/26/2018), 800 mg (11/09/2018), 800 mg (11/23/2018), 800 mg (12/07/2018) oxaliplatin (ELOXATIN) 170 mg in dextrose 5 % 500 mL chemo infusion, 85 mg/m2 = 170 mg, Intravenous,  Once, 12 of 12 cycles Dose modification: 65 mg/m2 (original dose 85 mg/m2, Cycle 6, Reason:  Other (see comments), Comment: thrombocytopenia) Administration: 170  mg (06/22/2018), 170 mg (07/06/2018), 170 mg (07/27/2018), 170 mg (08/10/2018), 170 mg (08/24/2018), 130 mg (09/07/2018), 130 mg (09/21/2018), 130 mg (10/12/2018), 130 mg (10/26/2018), 130 mg (11/09/2018), 130 mg (11/23/2018), 130 mg (12/07/2018) fluorouracil (ADRUCIL) chemo injection 800 mg, 400 mg/m2 = 800 mg, Intravenous,  Once, 25 of 25 cycles Administration: 800 mg (06/22/2018), 800 mg (07/06/2018), 800 mg (07/27/2018), 800 mg (08/10/2018), 800 mg (08/24/2018), 800 mg (09/07/2018), 800 mg (09/21/2018), 800 mg (10/12/2018), 800 mg (10/26/2018), 800 mg (11/09/2018), 800 mg (11/23/2018), 800 mg (12/07/2018), 800 mg (12/28/2018), 800 mg (01/18/2019), 800 mg (02/08/2019), 800 mg (03/22/2019), 800 mg (03/01/2019), 800 mg (04/12/2019), 800 mg (05/03/2019), 800 mg (05/17/2019), 800 mg (05/31/2019), 800 mg (06/14/2019), 800 mg (06/28/2019), 800 mg (07/19/2019), 800 mg (08/02/2019) leucovorin injection 40 mg, 20 mg/m2 = 40 mg (100 % of original dose 20 mg/m2), Intravenous,  Once, 13 of 13 cycles Dose modification: 20 mg/m2 (original dose 20 mg/m2, Cycle 13, Reason: Other (see comments), Comment: iv push) Administration: 40 mg (12/28/2018), 40 mg (01/18/2019), 40 mg (02/08/2019), 40 mg (03/22/2019), 40 mg (03/01/2019), 40 mg (04/12/2019), 40 mg (05/03/2019), 40 mg (05/17/2019), 40 mg (05/31/2019), 40 mg (06/14/2019), 40 mg (06/28/2019), 40 mg (07/19/2019), 40 mg (08/02/2019) fluorouracil (ADRUCIL) 4,850 mg in sodium chloride 0.9 % 53 mL chemo infusion, 2,400 mg/m2 = 4,850 mg, Intravenous, 1 Day/Dose, 25 of 25 cycles Administration: 4,850 mg (06/22/2018), 4,850 mg (07/06/2018), 4,850 mg (07/27/2018), 4,850 mg (08/10/2018), 4,850 mg (08/24/2018), 4,850 mg (09/07/2018), 4,850 mg (09/21/2018), 4,850 mg (10/12/2018), 4,850 mg (10/26/2018), 4,850 mg (11/09/2018), 4,850 mg (11/23/2018), 4,850 mg (12/07/2018), 4,850 mg (12/28/2018), 4,850 mg (01/18/2019), 4,850 mg (02/08/2019), 4,850 mg (03/22/2019), 4,850 mg (03/01/2019), 4,850 mg (04/12/2019), 4,850 mg (05/03/2019), 4,850 mg  (05/17/2019), 4,850 mg (05/31/2019), 4,850 mg (06/14/2019), 4,850 mg (06/28/2019), 4,850 mg (07/19/2019), 4,850 mg (08/02/2019) bevacizumab-awwb (MVASI) 400 mg in sodium chloride 0.9 % 100 mL chemo infusion, 450 mg (100 % of original dose 5 mg/kg), Intravenous,  Once, 3 of 3 cycles Dose modification: 5 mg/kg (original dose 5 mg/kg, Cycle 23, Reason: Other (see comments), Comment: biosimilar) Administration: 400 mg (06/28/2019), 400 mg (07/19/2019), 400 mg (08/02/2019)  for chemotherapy treatment.    08/16/2019 -  Chemotherapy    Patient is on Treatment Plan: COLORECTAL FOLFIRI / BEVACIZUMAB Q14D         HISTORY OF PRESENTING ILLNESS:  Miguel Flores 80 y.o.  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  Patient interim has been evaluated by radiation oncology-for consideration of radiation to the enlarging lung nodule.  Patient denies any new shortness of breath or cough.  Denies any worsening diarrhea.  Chronic tingling and numbness in extremities.  Is currently on Lyrica.  Review of Systems  Constitutional: Positive for malaise/fatigue. Negative for chills, diaphoresis, fever and weight loss.  HENT: Negative for nosebleeds and sore throat.   Eyes: Negative for double vision.  Respiratory: Negative for cough, hemoptysis, sputum production, shortness of breath and wheezing.   Cardiovascular: Negative for chest pain, palpitations, orthopnea and leg swelling.  Gastrointestinal: Negative for abdominal pain, blood in stool, constipation, diarrhea, heartburn, melena, nausea and vomiting.  Genitourinary: Negative for dysuria, frequency and urgency.  Musculoskeletal: Positive for joint pain. Negative for back pain.  Skin: Negative.  Negative for itching and rash.  Neurological: Positive for tingling. Negative for dizziness, focal weakness, weakness and headaches.  Endo/Heme/Allergies: Does not bruise/bleed easily.  Psychiatric/Behavioral: Negative for depression. The patient is not nervous/anxious and does not have insomnia.      MEDICAL HISTORY:  Past Medical History:  Diagnosis Date  . Cancer ([place])    skin cancer-nose w graft,also shoulder- basal cell per pt  . Cataract    r eye  . Chronic kidney disease    kidney stones 20 y ago per pt  . Colon cancer ([place]) 2018   Surgical resection and chemo tx's.  . Diabetes mellitus without complication ([place])   . Eczema   . History of kidney stones   . Hyperlipidemia   . Hypertension   . neuropathy from chemo   . Vertigo     SURGICAL HISTORY: Past Surgical History:  Procedure Laterality Date  . CATARACT EXTRACTION Left   . COLECTOMY WITH COLOSTOMY CREATION/HARTMANN PROCEDURE N/A 02/06/2017   Procedure: COLECTOMY WITH COLOSTOMY CREATION/HARTMANN PROCEDURE;  Surgeon: [name], MD;  Location: [facility] ORS;  Service: General;  Laterality: N/A;  . COLON SURGERY    . COLONOSCOPY WITH PROPOFOL N/A 09/04/2017   Procedure: COLONOSCOPY WITH PROPOFOL;  Surgeon: Jonathon Bellows, MD;  Location[facility];  Service: Gastroenterology;  Laterality: N/A;  . COLOSTOMY CLOSURE N/A 10/20/2017   Procedure: COLOSTOMY CLOSURE;  Surgeon: [name], MD;  Location: [facility] ORS;  Service: General;  Laterality: N/A;  . CYSTOSCOPY    . FLEXIBLE SIGMOIDOSCOPY N/A 02/06/2017   Procedure: FLEXIBLE SIGMOIDOSCOPY;  Surgeon: Christene Lye, MD;  Location: [facility] ENDOSCOPY;  Service: Endoscopy;  Laterality: N/A;  . PORTACATH PLACEMENT N/A 06/17/2018   Procedure: INSERTION PORT-A-CATH, WITH FLUOROSCOPY;  Surgeon: [name], MD;  Location: [facility] ORS;  Service: General;  Laterality: N/A;    SOCIAL HISTORY: Social History   Socioeconomic History  . Marital status: Married    Spouse name: Not on file  . Number of children: Not on file  . Years of education: Not on file  . Highest education level: Not on file  Occupational History  . Not on file  Tobacco Use   . Smoking status: Never Smoker  . Smokeless tobacco: Never Used  Vaping Use  . Vaping Use: Never used  Substance and Sexual Activity  . Alcohol use: No  . Drug use: No  . Sexual activity: Not Currently  Other Topics Concern  . Not on file  Social History Narrative  . Not on file   Social Determinants of Health   Financial Resource Strain: Not on file  Food Insecurity: Not on file  Transportation Needs: Not on file  Physical Activity: Not on file  Stress: Not on file  Social Connections: Not on file  Intimate Partner Violence: Not on file    FAMILY HISTORY: Family History  Problem Relation Age of Onset  . Diabetes Mother   . AAA (abdominal aortic aneurysm) Mother   . Coronary artery disease Mother   . Diabetes Father   . Coronary artery disease Father   . Dementia Father   . Breast cancer Sister     ALLERGIES:  has No Known Allergies.  MEDICATIONS:  Current Outpatient Medications  Medication Sig Dispense Refill  . acetaminophen (TYLENOL) 500 MG tablet Take 1,000 mg by mouth in the morning and at bedtime.    Mar[place] Kitchen aspirin EC 81 MG tablet Take 81 mg by mouth daily.    . Dulaglutide 0.75 MG/0.5ML SOPN Inject 1.5 mg into the skin once a week.     Marland Kitchen glimepiride (AMARYL) 2 MG tablet Take 2 mg by  mouth 2 (two) times daily before a meal.     . loratadine (CLARITIN) 10 MG tablet Take 10 mg by mouth daily.    Marland Kitchen losartan (COZAAR) 25 MG tablet Take 25 mg by mouth daily.    . metFORMIN (GLUCOPHAGE) 1000 MG tablet Take 1,000-1,500 tablets by mouth See admin instructions. TAKE 1 TABLET (1000 MG) BY MOUTH IN THE MORNING & TAKE 1.5 TABLETS (1500 MG) BY MOUTH AT SUPPER    . ONETOUCH VERIO test strip     . pravastatin (PRAVACHOL) 20 MG tablet Take 20 mg by mouth daily with supper.     . pregabalin (LYRICA) 75 MG capsule Take 1 capsule (75 mg total) by mouth 2 (two) times daily. 60 capsule 3  . Apremilast (OTEZLA PO) Take 1 tablet by mouth daily. (Patient not taking: No sig reported)     . clobetasol cream (TEMOVATE) 9.50 % Apply 1 application topically 2 (two) times daily as needed. (Patient not taking: Reported on 04/16/2021)    . Docusate Sodium (COLACE PO) Take 1 Dose by mouth as needed. (Patient not taking: Reported on 04/16/2021)    . hydrOXYzine (ATARAX/VISTARIL) 25 MG tablet Take 25 mg by mouth daily. (Patient not taking: Reported on 04/16/2021)    . lidocaine-prilocaine (EMLA) cream Place a small amount over cream over port 1 hour before each treatment, cover cream with saran wrap for clothing protection (Patient not taking: No sig reported) 30 g 1  . ondansetron (ZOFRAN) 8 MG tablet Take 1 tablet (8 mg total) by mouth every 8 (eight) hours as needed for nausea or vomiting. (Patient not taking: No sig reported) 20 tablet 0  . triamcinolone (KENALOG) 0.025 % cream Apply 1 application topically daily as needed (FOR EZCEMA). (Patient not taking: No sig reported) 30 g 2   No current facility-administered medications for this visit.   Facility-Administered Medications Ordered in Other Visits  Medication Dose Route Frequency Provider Last Rate Last Admin  . 0.9 %  sodium chloride infusion   Intravenous Continuous Sindy Guadeloupe, MD   Stopped at 07/27/18 1037  . fluorouracil (ADRUCIL) 4,800 mg in sodium chloride 0.9 % 54 mL chemo infusion  2,400 mg/m2 (Treatment Plan Recorded) Intravenous 1 day or 1 dose Sindy Guadeloupe, MD   4,800 mg at 04/16/21 1257  . heparin lock flush 100 unit/mL  500 Units Intravenous Once Sindy Guadeloupe, MD      . sodium chloride flush (NS) 0.9 % injection 10 mL  10 mL Intravenous PRN Sindy Guadeloupe, MD   10 mL at 07/27/18 0920  . sodium chloride flush (NS) 0.9 % injection 10 mL  10 mL Intravenous Once Sindy Guadeloupe, MD          .  PHYSICAL EXAMINATION: ECOG PERFORMANCE STATUS: 1 - Symptomatic but completely ambulatory  Vitals:   04/16/21 0857  BP: (!) 156/62  Pulse: 75  Resp: 16  Temp: 98 F (36.7 C)  SpO2: 98%   Filed Weights   04/16/21 0857   Weight: 175 lb (79.4 kg)    Physical Exam   LABORATORY DATA:  I have reviewed the data as listed Lab Results  Component Value Date   WBC 7.0 04/16/2021   HGB 9.7 (L) 04/16/2021   HCT 31.9 (L) 04/16/2021   MCV 87.2 04/16/2021   PLT 205 04/16/2021   Recent Labs    06/19/20 0815 07/03/20 0805 07/24/20 0800 08/14/20 0824 03/05/21 0801 03/26/21 0818 04/16/21 0845  NA 136 137 138   < >  137 135 136  K 4.8 4.2 4.8   < > 4.6 4.2 4.8  CL 104 106 106   < > 106 102 103  CO2 21* 22 22   < > 20* 22 20*  GLUCOSE 220* 209* 188*   < > 198* 189* 149*  BUN 25* 13 17   < > 26* 17 19  CREATININE 1.46* 1.08 0.96   < > 0.85 1.06 0.88  CALCIUM 8.7* 8.5* 8.9   < > 9.1 8.8* 9.0  GFRNONAA 45* >60 >60   < > >60 >60 >60  GFRAA 52* >60 >60  --   --   --   --   PROT 7.0 6.5 7.1   < > 7.1 6.8 7.0  ALBUMIN 3.9 3.8 4.0   < > 3.9 3.8 4.0  AST 46* 39 48*   < > 43* 57* 40  ALT 39 28 34   < > 32 39 34  ALKPHOS 103 92 81   < > 89 97 86  BILITOT 0.5 0.5 0.6   < > 0.5 0.4 0.5   < > = values in this interval not displayed.    RADIOGRAPHIC STUDIES: I have personally reviewed the radiological images as listed and agreed with the findings in the report. No results found.  ASSESSMENT & PLAN:   Cancer of left colon [facility]) Patient is a 80 y.o. 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   #Proceed with FOLFIRI today.; .  Hold bev-see below; Labs today reviewed;  acceptable for treatment today. With [facility].   #Dental extraction plan-currently postponed to July 13th; hold Bev at this time.  # Lung nodules-enlarging evaluated by radiation oncology.  Plan SBRT 10Fx- starting 6/13.   # PN-2-prior oxaliplatin overall stable.  Continue Lyrica.    # DISPOSITION: note for procedure # chemo today; HOLD Avastin # follow up as planned- with Dr.rao in 3 weeks; chemo as planned-dr.B   All questions were answered. The patient knows to call the clinic  with any problems, questions or concerns.     Cammie Sickle, MD 04/16/2021 2:53 PM

## 2021-04-16 NOTE — Patient Instructions (Signed)
[facility]  Discharge Instructions: Thank you for choosing [facility] to provide your oncology and hematology care.  If you have a lab appointment with the [facility], please go directly to the [facility] and check in at the registration area.  Wear comfortable clothing and clothing appropriate for easy access to any Portacath or PICC line.   We strive to give you quality time with your provider. You may need to reschedule your appointment if you arrive late (15 or more minutes).  Arriving late affects you and other patients whose appointments are after yours.  Also, if you miss three or more appointments without notifying the office, you may be dismissed from the clinic at the provider's discretion.      For prescription refill requests, have your pharmacy contact our office and allow 72 hours for refills to be completed.     Today you received the following chemotherapy and/or immunotherapy agents   To help prevent nausea and vomiting after your treatment, we encourage you to take your nausea medication as directed.  BELOW ARE SYMPTOMS THAT SHOULD BE REPORTED IMMEDIATELY: . *FEVER GREATER THAN 100.4 F (38 C) OR HIGHER . *CHILLS OR SWEATING . *NAUSEA AND VOMITING THAT IS NOT CONTROLLED WITH YOUR NAUSEA MEDICATION . *UNUSUAL SHORTNESS OF BREATH . *UNUSUAL BRUISING OR BLEEDING . *URINARY PROBLEMS (pain or burning when urinating, or frequent urination) . *BOWEL PROBLEMS (unusual diarrhea, constipation, pain near the anus) . TENDERNESS IN MOUTH AND THROAT WITH OR WITHOUT PRESENCE OF ULCERS (sore throat, sores in mouth, or a toothache) . UNUSUAL RASH, SWELLING OR PAIN  . UNUSUAL VAGINAL DISCHARGE OR ITCHING   Items with * indicate a potential emergency and should be followed up as soon as possible or go to the Emergency Department if any problems should occur.  Please show the CHEMOTHERAPY ALERT CARD or IMMUNOTHERAPY ALERT CARD at  check-in to the Emergency Department and triage nurse.  Should you have questions after your visit or need to cancel or reschedule your appointment, please contact [facility]  [phone] and follow the prompts.  Office hours are 8:00 a.m. to 4:30 p.m. Monday - Friday. Please note that voicemails left after 4:00 p.m. may not be returned until the following business day.  We are closed weekends and major holidays. You have access to a nurse at all times for urgent questions. Please call the main number to the clinic [phone] and follow the prompts.  For any non-urgent questions, you may also contact your provider using MyChart. We now offer e-Visits for anyone 81 and older to request care online for non-urgent symptoms. For details visit [email or web address].   Also download the MyChart app! Go to the app store, search "MyChart", open the app, select [facility], and log in with your MyChart username and password.  Due to Covid, a mask is required upon entering the hospital/clinic. If you do not have a mask, one will be given to you upon arrival. For doctor visits, patients may have 1 support person aged 81 or older with them. For treatment visits, patients cannot have anyone with them due to current Covid guidelines and our immunocompromised population.

## 2021-04-18 ENCOUNTER — Inpatient Hospital Stay: Payer: Medicare Other

## 2021-04-18 VITALS — BP 124/58 | HR 64 | Temp 97.0°F | Resp 20

## 2021-04-18 DIAGNOSIS — Z5111 Encounter for antineoplastic chemotherapy: Secondary | ICD-10-CM | POA: Diagnosis not present

## 2021-04-18 DIAGNOSIS — C189 Malignant neoplasm of colon, unspecified: Secondary | ICD-10-CM

## 2021-04-18 DIAGNOSIS — C7802 Secondary malignant neoplasm of left lung: Secondary | ICD-10-CM | POA: Diagnosis not present

## 2021-04-18 DIAGNOSIS — C187 Malignant neoplasm of sigmoid colon: Secondary | ICD-10-CM | POA: Diagnosis present

## 2021-04-18 DIAGNOSIS — C7800 Secondary malignant neoplasm of unspecified lung: Secondary | ICD-10-CM

## 2021-04-18 MED ORDER — PEGFILGRASTIM-CBQV 6 MG/0.6ML ~~LOC~~ SOSY
6.0000 mg | PREFILLED_SYRINGE | Freq: Once | SUBCUTANEOUS | Status: AC
  Administered 2021-04-18: 6 mg via SUBCUTANEOUS
  Filled 2021-04-18: qty 0.6

## 2021-04-18 MED ORDER — HEPARIN SOD (PORK) LOCK FLUSH 100 UNIT/ML IV SOLN
500.0000 [IU] | Freq: Once | INTRAVENOUS | Status: AC | PRN
  Administered 2021-04-18: 500 [IU]
  Filled 2021-04-18: qty 5

## 2021-04-18 MED ORDER — SODIUM CHLORIDE 0.9% FLUSH
10.0000 mL | INTRAVENOUS | Status: DC | PRN
  Administered 2021-04-18: 10 mL
  Filled 2021-04-18: qty 10

## 2021-04-18 MED ORDER — HEPARIN SOD (PORK) LOCK FLUSH 100 UNIT/ML IV SOLN
INTRAVENOUS | Status: AC
  Filled 2021-04-18: qty 5

## 2021-04-22 ENCOUNTER — Ambulatory Visit
Admission: RE | Admit: 2021-04-22 | Discharge: 2021-04-22 | Disposition: A | Discharge status: Home / Self Care | Payer: Medicare Other | Source: Ambulatory Visit | Attending: Radiation Oncology | Admitting: Radiation Oncology

## 2021-04-22 DIAGNOSIS — C7802 Secondary malignant neoplasm of left lung: Secondary | ICD-10-CM | POA: Diagnosis not present

## 2021-04-23 ENCOUNTER — Ambulatory Visit
Admission: RE | Admit: 2021-04-23 | Discharge: 2021-04-23 | Disposition: A | Discharge status: Home / Self Care | Payer: Medicare Other | Source: Ambulatory Visit | Attending: Radiation Oncology | Admitting: Radiation Oncology

## 2021-04-23 DIAGNOSIS — C7802 Secondary malignant neoplasm of left lung: Secondary | ICD-10-CM | POA: Diagnosis not present

## 2021-04-24 ENCOUNTER — Ambulatory Visit
Admission: RE | Admit: 2021-04-24 | Discharge: 2021-04-24 | Disposition: A | Discharge status: Home / Self Care | Payer: Medicare Other | Source: Ambulatory Visit | Attending: Radiation Oncology | Admitting: Radiation Oncology

## 2021-04-24 ENCOUNTER — Ambulatory Visit: Payer: Medicare Other

## 2021-04-24 DIAGNOSIS — C7802 Secondary malignant neoplasm of left lung: Secondary | ICD-10-CM | POA: Diagnosis not present

## 2021-04-25 ENCOUNTER — Ambulatory Visit
Admission: RE | Admit: 2021-04-25 | Discharge: 2021-04-25 | Disposition: A | Discharge status: Home / Self Care | Payer: Medicare Other | Source: Ambulatory Visit | Attending: Radiation Oncology | Admitting: Radiation Oncology

## 2021-04-25 DIAGNOSIS — C7802 Secondary malignant neoplasm of left lung: Secondary | ICD-10-CM | POA: Diagnosis not present

## 2021-04-26 ENCOUNTER — Ambulatory Visit
Admission: RE | Admit: 2021-04-26 | Discharge: 2021-04-26 | Disposition: A | Discharge status: Home / Self Care | Payer: Medicare Other | Source: Ambulatory Visit | Attending: Radiation Oncology | Admitting: Radiation Oncology

## 2021-04-26 DIAGNOSIS — C7802 Secondary malignant neoplasm of left lung: Secondary | ICD-10-CM | POA: Diagnosis not present

## 2021-04-29 ENCOUNTER — Ambulatory Visit: Payer: Medicare Other

## 2021-04-29 ENCOUNTER — Ambulatory Visit
Admission: RE | Admit: 2021-04-29 | Discharge: 2021-04-29 | Disposition: A | Discharge status: Home / Self Care | Payer: Medicare Other | Source: Ambulatory Visit | Attending: Radiation Oncology | Admitting: Radiation Oncology

## 2021-04-29 DIAGNOSIS — C7802 Secondary malignant neoplasm of left lung: Secondary | ICD-10-CM | POA: Diagnosis not present

## 2021-04-30 ENCOUNTER — Ambulatory Visit
Admission: RE | Admit: 2021-04-30 | Discharge: 2021-04-30 | Disposition: A | Discharge status: Home / Self Care | Payer: Medicare Other | Source: Ambulatory Visit | Attending: Radiation Oncology | Admitting: Radiation Oncology

## 2021-04-30 DIAGNOSIS — C7802 Secondary malignant neoplasm of left lung: Secondary | ICD-10-CM | POA: Diagnosis not present

## 2021-05-01 ENCOUNTER — Ambulatory Visit
Admission: RE | Admit: 2021-05-01 | Discharge: 2021-05-01 | Disposition: A | Discharge status: Home / Self Care | Payer: Medicare Other | Source: Ambulatory Visit | Attending: Radiation Oncology | Admitting: Radiation Oncology

## 2021-05-01 ENCOUNTER — Ambulatory Visit: Payer: Medicare Other

## 2021-05-01 DIAGNOSIS — C7802 Secondary malignant neoplasm of left lung: Secondary | ICD-10-CM | POA: Diagnosis not present

## 2021-05-02 ENCOUNTER — Ambulatory Visit
Admission: RE | Admit: 2021-05-02 | Discharge: 2021-05-02 | Disposition: A | Discharge status: Home / Self Care | Payer: Medicare Other | Source: Ambulatory Visit | Attending: Radiation Oncology | Admitting: Radiation Oncology

## 2021-05-02 DIAGNOSIS — C7802 Secondary malignant neoplasm of left lung: Secondary | ICD-10-CM | POA: Diagnosis not present

## 2021-05-03 ENCOUNTER — Ambulatory Visit
Admission: RE | Admit: 2021-05-03 | Discharge: 2021-05-03 | Disposition: A | Discharge status: Home / Self Care | Payer: Medicare Other | Source: Ambulatory Visit | Attending: Radiation Oncology | Admitting: Radiation Oncology

## 2021-05-03 DIAGNOSIS — C7802 Secondary malignant neoplasm of left lung: Secondary | ICD-10-CM | POA: Diagnosis not present

## 2021-05-03 NOTE — Progress Notes (Signed)
Per MD No MVASI due to upcoming procedure. Schedule states 5FU, Confirmed Folfiri. Leucovorin and Irinotecan in NS.

## 2021-05-06 ENCOUNTER — Ambulatory Visit: Payer: Medicare Other

## 2021-05-07 ENCOUNTER — Inpatient Hospital Stay (HOSPITAL_BASED_OUTPATIENT_CLINIC_OR_DEPARTMENT_OTHER): Payer: Medicare Other | Admitting: Oncology

## 2021-05-07 ENCOUNTER — Inpatient Hospital Stay: Payer: Medicare Other

## 2021-05-07 VITALS — BP 128/58 | HR 84 | Temp 97.2°F | Resp 20 | Wt 167.0 lb

## 2021-05-07 DIAGNOSIS — C189 Malignant neoplasm of colon, unspecified: Secondary | ICD-10-CM

## 2021-05-07 DIAGNOSIS — Z5111 Encounter for antineoplastic chemotherapy: Secondary | ICD-10-CM

## 2021-05-07 DIAGNOSIS — C7800 Secondary malignant neoplasm of unspecified lung: Secondary | ICD-10-CM | POA: Diagnosis not present

## 2021-05-07 DIAGNOSIS — D6481 Anemia due to antineoplastic chemotherapy: Secondary | ICD-10-CM

## 2021-05-07 DIAGNOSIS — G620 Drug-induced polyneuropathy: Secondary | ICD-10-CM

## 2021-05-07 DIAGNOSIS — Z95828 Presence of other vascular implants and grafts: Secondary | ICD-10-CM

## 2021-05-07 DIAGNOSIS — T451X5A Adverse effect of antineoplastic and immunosuppressive drugs, initial encounter: Secondary | ICD-10-CM | POA: Diagnosis not present

## 2021-05-07 LAB — CBC WITH DIFFERENTIAL/PLATELET
Abs Immature Granulocytes: 0.02 10*3/uL (ref 0.00–0.07)
Basophils Absolute: 0.1 10*3/uL (ref 0.0–0.1)
Basophils Relative: 1 %
Eosinophils Absolute: 0.1 10*3/uL (ref 0.0–0.5)
Eosinophils Relative: 2 %
HCT: 33.4 % — ABNORMAL LOW (ref 39.0–52.0)
Hemoglobin: 10.3 g/dL — ABNORMAL LOW (ref 13.0–17.0)
Immature Granulocytes: 0 %
Lymphocytes Relative: 10 %
Lymphs Abs: 0.8 10*3/uL (ref 0.7–4.0)
MCH: 26.8 pg (ref 26.0–34.0)
MCHC: 30.8 g/dL (ref 30.0–36.0)
MCV: 87 fL (ref 80.0–100.0)
Monocytes Absolute: 0.9 10*3/uL (ref 0.1–1.0)
Monocytes Relative: 11 %
Neutro Abs: 6.1 10*3/uL (ref 1.7–7.7)
Neutrophils Relative %: 76 %
Platelets: 194 10*3/uL (ref 150–400)
RBC: 3.84 MIL/uL — ABNORMAL LOW (ref 4.22–5.81)
RDW: 20.3 % — ABNORMAL HIGH (ref 11.5–15.5)
WBC: 8 10*3/uL (ref 4.0–10.5)
nRBC: 0 % (ref 0.0–0.2)

## 2021-05-07 LAB — COMPREHENSIVE METABOLIC PANEL
ALT: 30 U/L (ref 0–44)
AST: 35 U/L (ref 15–41)
Albumin: 4 g/dL (ref 3.5–5.0)
Alkaline Phosphatase: 94 U/L (ref 38–126)
Anion gap: 12 (ref 5–15)
BUN: 16 mg/dL (ref 8–23)
CO2: 22 mmol/L (ref 22–32)
Calcium: 9.2 mg/dL (ref 8.9–10.3)
Chloride: 99 mmol/L (ref 98–111)
Creatinine, Ser: 0.96 mg/dL (ref 0.61–1.24)
GFR, Estimated: >60 mL/min (ref >60)
Glucose, Bld: 275 mg/dL — ABNORMAL HIGH (ref 70–99)
Potassium: 4.1 mmol/L (ref 3.5–5.1)
Sodium: 133 mmol/L — ABNORMAL LOW (ref 135–145)
Total Bilirubin: 1.2 mg/dL (ref 0.3–1.2)
Total Protein: 7.2 g/dL (ref 6.5–8.1)

## 2021-05-07 MED ORDER — ATROPINE SULFATE 1 MG/ML IJ SOLN
0.5000 mg | Freq: Once | INTRAMUSCULAR | Status: AC | PRN
  Administered 2021-05-07: 0.5 mg via INTRAVENOUS
  Filled 2021-05-07: qty 1

## 2021-05-07 MED ORDER — SODIUM CHLORIDE 0.9 % IV SOLN
150.0000 mg/m2 | Freq: Once | INTRAVENOUS | Status: AC
  Administered 2021-05-07: 300 mg via INTRAVENOUS
  Filled 2021-05-07: qty 15

## 2021-05-07 MED ORDER — SODIUM CHLORIDE 0.9 % IV SOLN
800.0000 mg | Freq: Once | INTRAVENOUS | Status: AC
Start: 2021-05-07 — End: 2021-05-07
  Administered 2021-05-07: 800 mg via INTRAVENOUS
  Filled 2021-05-07: qty 40

## 2021-05-07 MED ORDER — SODIUM CHLORIDE 0.9 % IV SOLN
2400.0000 mg/m2 | INTRAVENOUS | Status: DC
  Administered 2021-05-07: 4800 mg via INTRAVENOUS
  Filled 2021-05-07: qty 96

## 2021-05-07 MED ORDER — SODIUM CHLORIDE 0.9 % IV SOLN
Freq: Once | INTRAVENOUS | Status: AC
  Filled 2021-05-07: qty 250

## 2021-05-07 MED ORDER — PALONOSETRON HCL INJECTION 0.25 MG/5ML
0.2500 mg | Freq: Once | INTRAVENOUS | Status: AC
  Administered 2021-05-07: 0.25 mg via INTRAVENOUS
  Filled 2021-05-07: qty 5

## 2021-05-07 MED ORDER — HEPARIN SOD (PORK) LOCK FLUSH 100 UNIT/ML IV SOLN
500.0000 [IU] | Freq: Once | INTRAVENOUS | Status: DC
  Filled 2021-05-07: qty 5

## 2021-05-07 MED ORDER — DEXAMETHASONE SODIUM PHOSPHATE 10 MG/ML IJ SOLN
10.0000 mg | Freq: Once | INTRAMUSCULAR | Status: AC
  Administered 2021-05-07: 10 mg via INTRAVENOUS
  Filled 2021-05-07: qty 1

## 2021-05-07 MED ORDER — SODIUM CHLORIDE 0.9% FLUSH
10.0000 mL | Freq: Once | INTRAVENOUS | Status: AC
Start: 2021-05-07 — End: 2021-05-07
  Administered 2021-05-07: 10 mL via INTRAVENOUS
  Filled 2021-05-07: qty 10

## 2021-05-07 MED ORDER — FLUOROURACIL CHEMO INJECTION 2.5 GM/50ML
400.0000 mg/m2 | Freq: Once | INTRAVENOUS | Status: AC
  Administered 2021-05-07: 800 mg via INTRAVENOUS
  Filled 2021-05-07: qty 16

## 2021-05-07 NOTE — Patient Instructions (Signed)
[facility]  Discharge Instructions: Thank you for choosing [facility] to provide your oncology and hematology care.  If you have a lab appointment with the [facility], please go directly to the [facility] and check in at the registration area.  Wear comfortable clothing and clothing appropriate for easy access to any Portacath or PICC line.   We strive to give you quality time with your provider. You may need to reschedule your appointment if you arrive late (15 or more minutes).  Arriving late affects you and other patients whose appointments are after yours.  Also, if you miss three or more appointments without notifying the office, you may be dismissed from the clinic at the provider's discretion.      For prescription refill requests, have your pharmacy contact our office and allow 72 hours for refills to be completed.    Today you received the following chemotherapy and/or immunotherapy agents FOLFIRI      To help prevent nausea and vomiting after your treatment, we encourage you to take your nausea medication as directed.  BELOW ARE SYMPTOMS THAT SHOULD BE REPORTED IMMEDIATELY: *FEVER GREATER THAN 100.4 F (38 C) OR HIGHER *CHILLS OR SWEATING *NAUSEA AND VOMITING THAT IS NOT CONTROLLED WITH YOUR NAUSEA MEDICATION *UNUSUAL SHORTNESS OF BREATH *UNUSUAL BRUISING OR BLEEDING *URINARY PROBLEMS (pain or burning when urinating, or frequent urination) *BOWEL PROBLEMS (unusual diarrhea, constipation, pain near the anus) TENDERNESS IN MOUTH AND THROAT WITH OR WITHOUT PRESENCE OF ULCERS (sore throat, sores in mouth, or a toothache) UNUSUAL RASH, SWELLING OR PAIN  UNUSUAL VAGINAL DISCHARGE OR ITCHING   Items with * indicate a potential emergency and should be followed up as soon as possible or go to the Emergency Department if any problems should occur.  Please show the CHEMOTHERAPY ALERT CARD or IMMUNOTHERAPY ALERT CARD at check-in to  the Emergency Department and triage nurse.  Should you have questions after your visit or need to cancel or reschedule your appointment, please contact [facility]  [phone] and follow the prompts.  Office hours are 8:00 a.m. to 4:30 p.m. Monday - Friday. Please note that voicemails left after 4:00 p.m. may not be returned until the following business day.  We are closed weekends and major holidays. You have access to a nurse at all times for urgent questions. Please call the main number to the clinic [phone] and follow the prompts.  For any non-urgent questions, you may also contact your provider using MyChart. We now offer e-Visits for anyone 16 and older to request care online for non-urgent symptoms. For details visit [email or web address].   Also download the MyChart app! Go to the app store, search "MyChart", open the app, select [facility], and log in with your MyChart username and password.  Due to Covid, a mask is required upon entering the hospital/clinic. If you do not have a mask, one will be given to you upon arrival. For doctor visits, patients may have 1 support person aged 29 or older with them. For treatment visits, patients cannot have anyone with them due to current Covid guidelines and our immunocompromised population. Leucovorin injection What is this medication? LEUCOVORIN (loo koe VOR in) is used to prevent or treat the harmful effects of some medicines. This medicine is used to treat anemia caused by a low amount of folic acid in the body. It is also used with 5-fluorouracil (5-FU) to treatcolon cancer. This medicine may be used for  other purposes; ask your health care provider orpharmacist if you have questions. What should I tell my care team before I take this medication? They need to know if you have any of these conditions: anemia from low levels of vitamin B-12 in the blood an unusual or allergic reaction to leucovorin,  folic acid, other medicines, foods, dyes, or preservatives pregnant or trying to get pregnant breast-feeding How should I use this medication? This medicine is for injection into a muscle or into a vein. It is given by ahealth care professional in a hospital or clinic setting. Talk to your pediatrician regarding the use of this medicine in children.Special care may be needed. Overdosage: If you think you have taken too much of this medicine contact apoison control center or emergency room at once. NOTE: This medicine is only for you. Do not share this medicine with others. What if I miss a dose? This does not apply. What may interact with this medication? capecitabine fluorouracil phenobarbital phenytoin primidone trimethoprim-sulfamethoxazole This list may not describe all possible interactions. Give your health care provider a list of all the medicines, herbs, non-prescription drugs, or dietary supplements you use. Also tell them if you smoke, drink alcohol, or use illegaldrugs. Some items may interact with your medicine. What should I watch for while using this medication? Your condition will be monitored carefully while you are receiving thismedicine. This medicine may increase the side effects of 5-fluorouracil, 5-FU. Tell your doctor or health care professional if you have diarrhea or mouth sores that donot get better or that get worse. What side effects may I notice from receiving this medication? Side effects that you should report to your doctor or health care professionalas soon as possible: allergic reactions like skin rash, itching or hives, swelling of the face, lips, or tongue breathing problems fever, infection mouth sores unusual bleeding or bruising unusually weak or tired Side effects that usually do not require medical attention (report to yourdoctor or health care professional if they continue or are bothersome): constipation or diarrhea loss of appetite nausea,  vomiting This list may not describe all possible side effects. Call your doctor for medical advice about side effects. You may report side effects to FDA at1-800-FDA-1088. Where should I keep my medication? This drug is given in a hospital or clinic and will not be stored at home. NOTE: This sheet is a summary. It may not cover all possible information. If you have questions about this medicine, talk to your doctor, pharmacist, orhealth care provider.  2022 Elsevier/Gold Standard (2008-05-02 16:50:29) Irinotecan injection What is this medication? IRINOTECAN (ir in oh TEE kan ) is a chemotherapy drug. It is used to treatcolon and rectal cancer. This medicine may be used for other purposes; ask your health care provider orpharmacist if you have questions. COMMON BRAND NAME(S): Camptosar What should I tell my care team before I take this medication? They need to know if you have any of these conditions: dehydration diarrhea infection (especially a virus infection such as chickenpox, cold sores, or herpes) liver disease low blood counts, like low white cell, platelet, or red cell counts low levels of calcium, magnesium, or potassium in the blood recent or ongoing radiation therapy an unusual or allergic reaction to irinotecan, other medicines, foods, dyes, or preservatives pregnant or trying to get pregnant breast-feeding How should I use this medication? This drug is given as an infusion into a vein. It is administered in a hospitalor clinic by a specially trained health care professional.  Talk to your pediatrician regarding the use of this medicine in children.Special care may be needed. Overdosage: If you think you have taken too much of this medicine contact apoison control center or emergency room at once. NOTE: This medicine is only for you. Do not share this medicine with others. What if I miss a dose? It is important not to miss your dose. Call your doctor or health careprofessional  if you are unable to keep an appointment. What may interact with this medication? Do not take this medicine with any of the following medications: cobicistat itraconazole This medicine may interact with the following medications: antiviral medicines for HIV or AIDS certain antibiotics like rifampin or rifabutin certain medicines for fungal infections like ketoconazole, posaconazole, and voriconazole certain medicines for seizures like carbamazepine, phenobarbital, phenotoin clarithromycin gemfibrozil nefazodone St. John's Wort This list may not describe all possible interactions. Give your health care provider a list of all the medicines, herbs, non-prescription drugs, or dietary supplements you use. Also tell them if you smoke, drink alcohol, or use illegaldrugs. Some items may interact with your medicine. What should I watch for while using this medication? Your condition will be monitored carefully while you are receiving this medicine. You will need important blood work done while you are taking thismedicine. This drug may make you feel generally unwell. This is not uncommon, as chemotherapy can affect healthy cells as well as cancer cells. Report any side effects. Continue your course of treatment even though you feel ill unless yourdoctor tells you to stop. In some cases, you may be given additional medicines to help with side effects.Follow all directions for their use. You may get drowsy or dizzy. Do not drive, use machinery, or do anything that needs mental alertness until you know how this medicine affects you. Do not stand or sit up quickly, especially if you are an older patient. This reducesthe risk of dizzy or fainting spells. Call your health care professional for advice if you get a fever, chills, or sore throat, or other symptoms of a cold or flu. Do not treat yourself. This medicine decreases your body's ability to fight infections. Try to avoid beingaround people who are  sick. Avoid taking products that contain aspirin, acetaminophen, ibuprofen, naproxen, or ketoprofen unless instructed by your doctor. These medicines may hide afever. This medicine may increase your risk to bruise or bleed. Call your doctor orhealth care professional if you notice any unusual bleeding. Be careful brushing and flossing your teeth or using a toothpick because you may get an infection or bleed more easily. If you have any dental work done,tell your dentist you are receiving this medicine. Do not become pregnant while taking this medicine or for 6 months after stopping it. Women should inform their health care professional if they wish to become pregnant or think they might be pregnant. Men should not father a child while taking this medicine and for 3 months after stopping it. There is potential for serious side effects to an unborn child. Talk to your health careprofessional for more information. Do not breast-feed an infant while taking this medicine or for 7 days afterstopping it. This medicine has caused ovarian failure in some women. This medicine may make it more difficult to get pregnant. Talk to your health care professional if [place] concerned about your fertility. This medicine has caused decreased sperm counts in some men. This may make it more difficult to father a child. Talk to your health care professional if youare concerned  about your fertility. What side effects may I notice from receiving this medication? Side effects that you should report to your doctor or health care professionalas soon as possible: allergic reactions like skin rash, itching or hives, swelling of the face, lips, or tongue chest pain diarrhea flushing, runny nose, sweating during infusion low blood counts - this medicine may decrease the number of white blood cells, red blood cells and platelets. You may be at increased risk for infections and bleeding. nausea, vomiting pain, swelling, warmth in the  leg signs of decreased platelets or bleeding - bruising, pinpoint red spots on the skin, black, tarry stools, blood in the urine signs of infection - fever or chills, cough, sore throat, pain or difficulty passing urine signs of decreased red blood cells - unusually weak or tired, fainting spells, lightheadedness Side effects that usually do not require medical attention (report to yourdoctor or health care professional if they continue or are bothersome): constipation hair loss headache loss of appetite mouth sores stomach pain This list may not describe all possible side effects. Call your doctor for medical advice about side effects. You may report side effects to FDA at1-800-FDA-1088. Where should I keep my medication? This drug is given in a hospital or clinic and will not be stored at home. NOTE: This sheet is a summary. It may not cover all possible information. If you have questions about this medicine, talk to your doctor, pharmacist, orhealth care provider.  2022 Elsevier/Gold Standard (2019-09-27 17:46:13) Fluorouracil, 5-FU injection What is this medication? FLUOROURACIL, 5-FU (flure oh YOOR a sil) is a chemotherapy drug. It slows the growth of cancer cells. This medicine is used to treat many types of cancer like breast cancer, colon or rectal cancer, pancreatic cancer, and stomachcancer. This medicine may be used for other purposes; ask your health care provider orpharmacist if you have questions. COMMON BRAND NAME(S): Adrucil What should I tell my care team before I take this medication? They need to know if you have any of these conditions: blood disorders dihydropyrimidine dehydrogenase (DPD) deficiency infection (especially a virus infection such as chickenpox, cold sores, or herpes) kidney disease liver disease malnourished, poor nutrition recent or ongoing radiation therapy an unusual or allergic reaction to fluorouracil, other chemotherapy, other medicines, foods,  dyes, or preservatives pregnant or trying to get pregnant breast-feeding How should I use this medication? This drug is given as an infusion or injection into a vein. It is administeredin a hospital or clinic by a specially trained health care professional. Talk to your pediatrician regarding the use of this medicine in children.Special care may be needed. Overdosage: If you think you have taken too much of this medicine contact apoison control center or emergency room at once. NOTE: This medicine is only for you. Do not share this medicine with others. What if I miss a dose? It is important not to miss your dose. Call your doctor or health careprofessional if you are unable to keep an appointment. What may interact with this medication? Do not take this medicine with any of the following medications: live virus vaccines This medicine may also interact with the following medications: medicines that treat or prevent blood clots like warfarin, enoxaparin, and dalteparin This list may not describe all possible interactions. Give your health care provider a list of all the medicines, herbs, non-prescription drugs, or dietary supplements you use. Also tell them if you smoke, drink alcohol, or use illegaldrugs. Some items may interact with your medicine. What should I  watch for while using this medication? Visit your doctor for checks on your progress. This drug may make you feel generally unwell. This is not uncommon, as chemotherapy can affect healthy cells as well as cancer cells. Report any side effects. Continue your course oftreatment even though you feel ill unless your doctor tells you to stop. In some cases, you may be given additional medicines to help with side effects.Follow all directions for their use. Call your doctor or health care professional for advice if you get a fever, chills or sore throat, or other symptoms of a cold or flu. Do not treat yourself. This drug decreases your body's  ability to fight infections. Try toavoid being around people who are sick. This medicine may increase your risk to bruise or bleed. Call your doctor orhealth care professional if you notice any unusual bleeding. Be careful brushing and flossing your teeth or using a toothpick because you may get an infection or bleed more easily. If you have any dental work done,tell your dentist you are receiving this medicine. Avoid taking products that contain aspirin, acetaminophen, ibuprofen, naproxen, or ketoprofen unless instructed by your doctor. These medicines may hide afever. Do not become pregnant while taking this medicine. Women should inform their doctor if they wish to become pregnant or think they might be pregnant. There is a potential for serious side effects to an unborn child. Talk to your health care professional or pharmacist for more information. Do not breast-feed aninfant while taking this medicine. Men should inform their doctor if they wish to father a child. This medicinemay lower sperm counts. Do not treat diarrhea with over the counter products. Contact your doctor ifyou have diarrhea that lasts more than 2 days or if it is severe and watery. This medicine can make you more sensitive to the sun. Keep out of the sun. If you cannot avoid being in the sun, wear protective clothing and use sunscreen.Do not use sun lamps or tanning beds/booths. What side effects may I notice from receiving this medication? Side effects that you should report to your doctor or health care professionalas soon as possible: allergic reactions like skin rash, itching or hives, swelling of the face, lips, or tongue low blood counts - this medicine may decrease the number of white blood cells, red blood cells and platelets. You may be at increased risk for infections and bleeding. signs of infection - fever or chills, cough, sore throat, pain or difficulty passing urine signs of decreased platelets or bleeding -  bruising, pinpoint red spots on the skin, black, tarry stools, blood in the urine signs of decreased red blood cells - unusually weak or tired, fainting spells, lightheadedness breathing problems changes in vision chest pain mouth sores nausea and vomiting pain, swelling, redness at site where injected pain, tingling, numbness in the hands or feet redness, swelling, or sores on hands or feet stomach pain unusual bleeding Side effects that usually do not require medical attention (report to yourdoctor or health care professional if they continue or are bothersome): changes in finger or toe nails diarrhea dry or itchy skin hair loss headache loss of appetite sensitivity of eyes to the light stomach upset unusually teary eyes This list may not describe all possible side effects. Call your doctor for medical advice about side effects. You may report side effects to FDA at1-800-FDA-1088. Where should I keep my medication? This drug is given in a hospital or clinic and will not be stored at home. NOTE: This sheet  is a summary. It may not cover all possible information. If you have questions about this medicine, talk to your doctor, pharmacist, orhealth care provider.  2022 Elsevier/Gold Standard (2019-09-27 15:00:03)

## 2021-05-07 NOTE — Progress Notes (Signed)
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 At that time he had progressive abdominal distention and no bowel movement for about one week. CT abdomen showed an apple core lesion in the descending/sigmoid colon.   2. Patient underwent Hartmann's procedure for obstructing sigmoid colon mass on 02/06/2017. Preoperative CEA was 5.0   3.  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 It is 0 out of 15 lymph nodes were positive for malignancy. Perineural and lymphovascular invasion was present. Margins were negative.pT3N0. MMR stable.   4. Given that he had high risk stage II colon cancer including he presented with obstruction and has LVI and perineural invasion- adjuvant xeloda chemotherapy was recommended for 6 months.   5. Patient had evidence of hand foot syndrome after cycle 3. Cycle 4 delayed by 1 week. Topical urea cream prescribed   6. Patient completed 8 cycles of adjuvant xeloda in October 2018. polps seen in sigmoid and decending colon but negative for malignancy   7. Repeat Ct abdomen after 8 cycles showed no metastatic disease. 6 mm lung nodule noted in RLL   8.  Patient had a repeat colonoscopy in October 2018 which showed polyps in his transverse  and descending colon which were taken out and were negative for high-grade dysplasia or malignancy.  Patient subsequently underwent colostomy takedown procedure by Dr. Burt Knack   9. Repeat CT chest abdomen/ pelvis showed increase in the size of previously seen lung nodules largest being 1.4 cm consistent with metastatic disease. PET/CT showed mild uptake in 2 lung nodules. Others were not hypermetabolic. No other evidence of metastatic disease   10. Repeat lung biopsy consistent with colon adenocarcinoma. Comprehensive RAS panel testing showed mutant KRAS. No KRASG12C   11.  Given the slow rate of growth of his colon cancer and desire to maintain his quality of life plan was to start chemotherapy after he gets a 62-monthbreak in summer.  Repeat CT chest abdomen and pelvis in August 2019 showed mild increase in the size of his lung nodules.  No new lung nodules are no new sites of distant metastatic disease.  FOLFOX Avastin chemotherapy started on 06/22/2018.  Patient completed 12 cycles of FOLFOX Avastin chemotherapy with continued response to his lung lesions.  Oxaliplatin was dropped and patient is currently on 5-FU Avastin.  He did not have mild progression of her disease in lung nodules and was switched to second line FOLFIRI Mvasi    12. NGS showed no actionable mutations.  PD-L1 less than 1%.  No evidence of HER2 and NTRKmutations   Interval history-patient is tolerating chemotherapy well.  He has an upcoming dental appointment on 05/23/2021.  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  ECOG PS- 1 Pain scale- 3 Opioid associated constipation- no  Review of systems- ROS  No Known Allergies   Past Medical History:  Diagnosis Date   Cancer ([place])    skin cancer-nose w graft,also shoulder- basal cell per pt   Cataract    r eye   Chronic kidney disease    kidney stones 20 y ago per pt   Colon cancer ([place]) 2018   Surgical resection and chemo tx's.   Diabetes  mellitus without complication ([place])    Eczema    History of kidney stones    Hyperlipidemia    Hypertension    neuropathy from chemo    Vertigo      Past Surgical History:  Procedure Laterality Date   CATARACT EXTRACTION Left    COLECTOMY WITH COLOSTOMY CREATION/HARTMANN PROCEDURE N/A 02/06/2017   Procedure: COLECTOMY WITH COLOSTOMY CREATION/HARTMANN PROCEDURE;  Surgeon: [name], MD;  Location: [facility] ORS;  Service: General;  Laterality: N/A;   COLON SURGERY     COLONOSCOPY WITH PROPOFOL N/A 09/04/2017   Procedure: COLONOSCOPY WITH PROPOFOL;  Surgeon: Jonathon Bellows, MD;  Location: [facility] ENDOSCOPY;  Service: Gastroenterology;  Laterality: N/A;   COLOSTOMY CLOSURE N/A 10/20/2017   Procedure: COLOSTOMY CLOSURE;  Surgeon: [name], MD;  Location: [facility] ORS;  Service: General;  Laterality: N/A;   CYSTOSCOPY     FLEXIBLE SIGMOIDOSCOPY N/A 02/06/2017   Procedure: FLEXIBLE SIGMOIDOSCOPY;  Surgeon: Christene Lye, MD;  Location: [facility] ENDOSCOPY;  Service: Endoscopy;  Laterality: N/A;   PORTACATH PLACEMENT N/A 06/17/2018   Procedure: INSERTION PORT-A-CATH, WITH FLUOROSCOPY;  Surgeon: [name], MD;  Location: [facility] ORS;  Service: General;  Laterality: N/A;    Social History   Socioeconomic History   Marital status: Married    Spouse name: Not on file   Number of children: Not on file   Years of education: Not on file   Highest education level: Not on file  Occupational History   Not on file  Tobacco Use   Smoking status: Never   Smokeless tobacco: Never  Vaping Use   Vaping Use: Never used  Substance and Sexual Activity   Alcohol use: No   Drug use: No   Sexual activity: Not Currently  Other Topics Concern   Not on file  Social History Narrative   Not on file   Social Determinants of Health   Financial Resource Strain: Not on file  Food Insecurity: Not on file  Transportation Needs: Not on file  Physical Activity: Not on file  Stress: Not on file   Social Connections: Not on file  Intimate Partner Violence: Not on file    Family History  Problem Relation Age of Onset   Diabetes Mother    AAA (abdominal aortic aneurysm) Mother    Coronary artery disease Mother    Diabetes Father    Coronary artery disease Father    Dementia Father    Breast cancer Sister      Current Outpatient Medications:    acetaminophen (TYLENOL) 500 MG tablet, Take 1,000 mg by mouth in the morning and at bedtime., Disp: , Rfl:    aspirin EC 81 MG tablet, Take 81 mg by mouth daily., Disp: , Rfl:    Docusate Sodium (COLACE PO), Take 1 Dose by mouth as needed., Disp: , Rfl:    Dulaglutide 0.75 MG/0.5ML SOPN, Inject 1.5 mg into the skin once a week. , Disp: , Rfl:    glimepiride (AMARYL) 2 MG tablet, Take 2 mg by mouth 2 (two) times daily before a meal. , Disp: , Rfl:  loratadine (CLARITIN) 10 MG tablet, Take 10 mg by mouth daily., Disp: , Rfl:    losartan (COZAAR) 25 MG tablet, Take 25 mg by mouth daily., Disp: , Rfl:    metFORMIN (GLUCOPHAGE) 1000 MG tablet, Take 1,000-1,500 tablets by mouth See admin instructions. TAKE 1 TABLET (1000 MG) BY MOUTH IN THE MORNING & TAKE 1.5 TABLETS (1500 MG) BY MOUTH AT SUPPER, Disp: , Rfl:    ondansetron (ZOFRAN) 8 MG tablet, Take 1 tablet (8 mg total) by mouth every 8 (eight) hours as needed for nausea or vomiting., Disp: 20 tablet, Rfl: 0   ONETOUCH VERIO test strip, , Disp: , Rfl:    pravastatin (PRAVACHOL) 20 MG tablet, Take 20 mg by mouth daily with supper. , Disp: , Rfl:    pregabalin (LYRICA) 75 MG capsule, Take 1 capsule (75 mg total) by mouth 2 (two) times daily., Disp: 60 capsule, Rfl: 3   Apremilast (OTEZLA PO), Take 1 tablet by mouth daily. (Patient not taking: No sig reported), Disp: , Rfl:    clobetasol cream (TEMOVATE) 1.61 %, Apply 1 application topically 2 (two) times daily as needed. (Patient not taking: No sig reported), Disp: , Rfl:    hydrOXYzine (ATARAX/VISTARIL) 25 MG tablet, Take 25 mg by mouth  daily. (Patient not taking: No sig reported), Disp: , Rfl:    lidocaine-prilocaine (EMLA) cream, Place a small amount over cream over port 1 hour before each treatment, cover cream with saran wrap for clothing protection (Patient not taking: No sig reported), Disp: 30 g, Rfl: 1   triamcinolone (KENALOG) 0.025 % cream, Apply 1 application topically daily as needed (FOR EZCEMA). (Patient not taking: No sig reported), Disp: 30 g, Rfl: 2 No current facility-administered medications for this visit.  Facility-Administered Medications Ordered in Other Visits:    0.9 %  sodium chloride infusion, , Intravenous, Continuous, Sindy Guadeloupe, MD, Stopped at 07/27/18 1037   fluorouracil (ADRUCIL) 4,800 mg in sodium chloride 0.9 % 54 mL chemo infusion, 2,400 mg/m2 (Treatment Plan Recorded), Intravenous, 1 day or 1 dose, Sindy Guadeloupe, MD, 4,800 mg at 05/07/21 1148   heparin lock flush 100 unit/mL, 500 Units, Intravenous, Once, Sindy Guadeloupe, MD   sodium chloride flush (NS) 0.9 % injection 10 mL, 10 mL, Intravenous, PRN, Sindy Guadeloupe, MD, 10 mL at 07/27/18 0920   sodium chloride flush (NS) 0.9 % injection 10 mL, 10 mL, Intravenous, Once, Sindy Guadeloupe, MD  Physical exam:  Vitals:   05/07/21 0837  BP: (!) 128/58  Pulse: 84  Resp: 20  Temp: (!) 97.2 F (36.2 C)  TempSrc: Tympanic  SpO2: 99%  Weight: 167 lb (75.8 kg)   Physical Exam   CMP Latest Ref Rng & Units 05/07/2021  Glucose 70 - 99 mg/dL 275(H)  BUN 8 - 23 mg/dL 16  Creatinine 0.61 - 1.24 mg/dL 0.96  Sodium 135 - 145 mmol/L 133(L)  Potassium 3.5 - 5.1 mmol/L 4.1  Chloride 98 - 111 mmol/L 99  CO2 22 - 32 mmol/L 22  Calcium 8.9 - 10.3 mg/dL 9.2  Total Protein 6.5 - 8.1 g/dL 7.2  Total Bilirubin 0.3 - 1.2 mg/dL 1.2  Alkaline Phos 38 - 126 U/L 94  AST 15 - 41 U/L 35  ALT 0 - 44 U/L 30   CBC Latest Ref Rng & Units 05/07/2021  WBC 4.0 - 10.5 K/uL 8.0  Hemoglobin 13.0 - 17.0 g/dL 10.3(L)  Hematocrit 39.0 - 52.0 % 33.4(L)  Platelets 150 -  400  K/uL 194    No images are attached to the encounter.  No results found.   Assessment and plan- Patient is a 80 y.o. male with stage IV colon cancer and lung metastases.  He has had progression on FOLFOX Zirabev chemotherapy.  He is here for on treatment assessment prior to cycle 29 of FOLFIRI chemotherapy  Counts okay to proceed with cycle 29 of FOLFIRI chemotherapy today.  We will hold Zirabev this week and for next cycle.  I will see him back in 3 weeks for cycle 30.  Repeat scans in early August 2022.  If scans show continued progression, I will consider switching him to [facility].  Patient has slowly growing lung nodules in the dominant lung nodule was recently radiated.  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 Encounter for antineoplastic chemotherapy   2. Malignant neoplasm metastatic to lung, unspecified laterality ([place])   3. Antineoplastic chemotherapy induced anemia   4. Chemotherapy-induced peripheral neuropathy ([place])      Dr. Randa Evens, MD, MPH [facility] [phone] 05/07/2021 1:01 PM

## 2021-05-07 NOTE — Progress Notes (Signed)
Here for poss chemo today. Complains of bloating in abd. Denies nausea. Bowels move every day or every other day. Appetite is fair.

## 2021-05-08 LAB — CEA: CEA: 6.5 ng/mL — ABNORMAL HIGH (ref 0.0–4.7)

## 2021-05-09 ENCOUNTER — Inpatient Hospital Stay: Payer: Medicare Other

## 2021-05-09 VITALS — BP 122/52 | HR 71 | Temp 95.7°F | Resp 16

## 2021-05-09 DIAGNOSIS — C189 Malignant neoplasm of colon, unspecified: Secondary | ICD-10-CM

## 2021-05-09 DIAGNOSIS — Z5111 Encounter for antineoplastic chemotherapy: Secondary | ICD-10-CM | POA: Diagnosis not present

## 2021-05-09 DIAGNOSIS — C7800 Secondary malignant neoplasm of unspecified lung: Secondary | ICD-10-CM

## 2021-05-09 MED ORDER — HEPARIN SOD (PORK) LOCK FLUSH 100 UNIT/ML IV SOLN
INTRAVENOUS | Status: AC
  Filled 2021-05-09: qty 5

## 2021-05-09 MED ORDER — PEGFILGRASTIM-CBQV 6 MG/0.6ML ~~LOC~~ SOSY
6.0000 mg | PREFILLED_SYRINGE | Freq: Once | SUBCUTANEOUS | Status: AC
  Administered 2021-05-09: 6 mg via SUBCUTANEOUS
  Filled 2021-05-09: qty 0.6

## 2021-05-09 MED ORDER — SODIUM CHLORIDE 0.9% FLUSH
10.0000 mL | INTRAVENOUS | Status: DC | PRN
  Administered 2021-05-09: 10 mL
  Filled 2021-05-09: qty 10

## 2021-05-09 MED ORDER — HEPARIN SOD (PORK) LOCK FLUSH 100 UNIT/ML IV SOLN
500.0000 [IU] | Freq: Once | INTRAVENOUS | Status: AC | PRN
Start: 2021-05-09 — End: 2021-05-09
  Administered 2021-05-09: 500 [IU]
  Filled 2021-05-09: qty 5

## 2021-05-09 NOTE — Patient Instructions (Signed)
[facility]  Discharge Instructions: Thank you for choosing [facility] to provide your oncology and hematology care.  If you have a lab appointment with the [facility], please go directly to the [facility] and check in at the registration area.  Wear comfortable clothing and clothing appropriate for easy access to any Portacath or PICC line.   We strive to give you quality time with your provider. You may need to reschedule your appointment if you arrive late (15 or more minutes).  Arriving late affects you and other patients whose appointments are after yours.  Also, if you miss three or more appointments without notifying the office, you may be dismissed from the clinic at the provider's discretion.      For prescription refill requests, have your pharmacy contact our office and allow 72 hours for refills to be completed.    Today you received udenyca    To help prevent nausea and vomiting after your treatment, we encourage you to take your nausea medication as directed.  BELOW ARE SYMPTOMS THAT SHOULD BE REPORTED IMMEDIATELY: *FEVER GREATER THAN 100.4 F (38 C) OR HIGHER *CHILLS OR SWEATING *NAUSEA AND VOMITING THAT IS NOT CONTROLLED WITH YOUR NAUSEA MEDICATION *UNUSUAL SHORTNESS OF BREATH *UNUSUAL BRUISING OR BLEEDING *URINARY PROBLEMS (pain or burning when urinating, or frequent urination) *BOWEL PROBLEMS (unusual diarrhea, constipation, pain near the anus) TENDERNESS IN MOUTH AND THROAT WITH OR WITHOUT PRESENCE OF ULCERS (sore throat, sores in mouth, or a toothache) UNUSUAL RASH, SWELLING OR PAIN  UNUSUAL VAGINAL DISCHARGE OR ITCHING   Items with * indicate a potential emergency and should be followed up as soon as possible or go to the Emergency Department if any problems should occur.  Please show the CHEMOTHERAPY ALERT CARD or IMMUNOTHERAPY ALERT CARD at check-in to the Emergency Department and triage nurse.  Should you  have questions after your visit or need to cancel or reschedule your appointment, please contact [facility]  [phone] and follow the prompts.  Office hours are 8:00 a.m. to 4:30 p.m. Monday - Friday. Please note that voicemails left after 4:00 p.m. may not be returned until the following business day.  We are closed weekends and major holidays. You have access to a nurse at all times for urgent questions. Please call the main number to the clinic [phone] and follow the prompts.  For any non-urgent questions, you may also contact your provider using MyChart. We now offer e-Visits for anyone 78 and older to request care online for non-urgent symptoms. For details visit [email or web address].   Also download the MyChart app! Go to the app store, search "MyChart", open the app, select [facility], and log in with your MyChart username and password.  Due to Covid, a mask is required upon entering the hospital/clinic. If you do not have a mask, one will be given to you upon arrival. For doctor visits, patients may have 1 support person aged 62 or older with them. For treatment visits, patients cannot have anyone with them due to current Covid guidelines and our immunocompromised population.

## 2021-05-28 ENCOUNTER — Inpatient Hospital Stay: Payer: Medicare Other

## 2021-05-28 ENCOUNTER — Inpatient Hospital Stay: Payer: Medicare Other | Admitting: Oncology

## 2021-05-30 ENCOUNTER — Inpatient Hospital Stay: Payer: Medicare Other

## 2021-06-06 ENCOUNTER — Ambulatory Visit: Payer: Medicare Other | Admitting: Radiation Oncology

## 2021-06-13 ENCOUNTER — Telehealth: Payer: Self-pay | Admitting: *Deleted

## 2021-06-13 NOTE — Telephone Encounter (Signed)
Got a call from the patient's son wanting to have a transfer to [place] with hospice at home.  Nyra Capes that we will need to check with hospice here and check with Dr. [name] and will get back to him.  Dr. [name] said patient will have to be evaluated in the facility he is in.  If they deem him a hospice candidate then we can contact hospice here at [facility] and get that set up for hospice and home.  Son did ask about the ambulance charge and I told him that I did asked the people at hospice and its a 50-50 chance whether they can help him with transportation.  And he is aware and they are willing to do what ever they need to do in order to get his dad back home.  [name] will ask for a referral to hospice and call us with the update.  I spoke to [place] at hospice about this and she says if he is deemed a hospice candidate then they can send all the information to fax number [phone]

## 2021-06-18 ENCOUNTER — Telehealth: Payer: Self-pay | Admitting: *Deleted

## 2021-06-18 ENCOUNTER — Telehealth: Payer: Self-pay | Admitting: Oncology

## 2021-06-18 NOTE — Telephone Encounter (Signed)
Call from [facility] asking if Dr [name] would sign and make changes as needed to orders for patient TPN. They are working on trying to get patient back to [facility]. She did call patient PCP first who asked they call Dr [name]. Please advise

## 2021-06-18 NOTE — Telephone Encounter (Signed)
Lilia Pro from [facility] center called to see if Dr. [name] would sign off on at home TPN for this patient. They are in the process of transferring him back to [place]. She did try to contact patient's PCP initially and she was directed to ONCOLOGY.

## 2021-06-20 NOTE — Telephone Encounter (Signed)
I got update from Jenny Reichmann and [name]- sons of pt. And they got Dr/ Netty Starring to sign for TPN. They are working on home health and [facility]. Patient is coming back to his house with help on Sat.

## 2021-06-21 NOTE — Telephone Encounter (Signed)
I have spoke to pt's children and Dr. Netty Starring approved TPN

## 2021-06-26 ENCOUNTER — Emergency Department: Payer: Medicare Other

## 2021-06-26 ENCOUNTER — Other Ambulatory Visit: Payer: Self-pay

## 2021-06-26 ENCOUNTER — Inpatient Hospital Stay
Admission: EM | Admit: 2021-06-26 | Discharge: 2021-07-25 | DRG: 393 | Disposition: A | Discharge status: Home Health | Payer: Medicare Other | Attending: Internal Medicine | Admitting: Internal Medicine

## 2021-06-26 ENCOUNTER — Encounter: Payer: Self-pay | Admitting: Emergency Medicine

## 2021-06-26 DIAGNOSIS — Z66 Do not resuscitate: Secondary | ICD-10-CM | POA: Diagnosis not present

## 2021-06-26 DIAGNOSIS — J90 Pleural effusion, not elsewhere classified: Secondary | ICD-10-CM | POA: Diagnosis not present

## 2021-06-26 DIAGNOSIS — E1169 Type 2 diabetes mellitus with other specified complication: Secondary | ICD-10-CM | POA: Diagnosis present

## 2021-06-26 DIAGNOSIS — Z7982 Long term (current) use of aspirin: Secondary | ICD-10-CM

## 2021-06-26 DIAGNOSIS — D638 Anemia in other chronic diseases classified elsewhere: Secondary | ICD-10-CM | POA: Diagnosis present

## 2021-06-26 DIAGNOSIS — C19 Malignant neoplasm of rectosigmoid junction: Secondary | ICD-10-CM | POA: Diagnosis present

## 2021-06-26 DIAGNOSIS — G9341 Metabolic encephalopathy: Secondary | ICD-10-CM | POA: Diagnosis not present

## 2021-06-26 DIAGNOSIS — Z79899 Other long term (current) drug therapy: Secondary | ICD-10-CM

## 2021-06-26 DIAGNOSIS — Z85828 Personal history of other malignant neoplasm of skin: Secondary | ICD-10-CM

## 2021-06-26 DIAGNOSIS — Z7984 Long term (current) use of oral hypoglycemic drugs: Secondary | ICD-10-CM

## 2021-06-26 DIAGNOSIS — Z9049 Acquired absence of other specified parts of digestive tract: Secondary | ICD-10-CM

## 2021-06-26 DIAGNOSIS — Z9221 Personal history of antineoplastic chemotherapy: Secondary | ICD-10-CM

## 2021-06-26 DIAGNOSIS — R1310 Dysphagia, unspecified: Secondary | ICD-10-CM

## 2021-06-26 DIAGNOSIS — Z85038 Personal history of other malignant neoplasm of large intestine: Secondary | ICD-10-CM

## 2021-06-26 DIAGNOSIS — T451X5A Adverse effect of antineoplastic and immunosuppressive drugs, initial encounter: Secondary | ICD-10-CM | POA: Diagnosis present

## 2021-06-26 DIAGNOSIS — M795 Residual foreign body in soft tissue: Secondary | ICD-10-CM

## 2021-06-26 DIAGNOSIS — C785 Secondary malignant neoplasm of large intestine and rectum: Secondary | ICD-10-CM | POA: Diagnosis present

## 2021-06-26 DIAGNOSIS — R188 Other ascites: Secondary | ICD-10-CM | POA: Diagnosis not present

## 2021-06-26 DIAGNOSIS — K921 Melena: Secondary | ICD-10-CM

## 2021-06-26 DIAGNOSIS — G620 Drug-induced polyneuropathy: Secondary | ICD-10-CM | POA: Diagnosis present

## 2021-06-26 DIAGNOSIS — E119 Type 2 diabetes mellitus without complications: Secondary | ICD-10-CM | POA: Diagnosis not present

## 2021-06-26 DIAGNOSIS — Z8249 Family history of ischemic heart disease and other diseases of the circulatory system: Secondary | ICD-10-CM

## 2021-06-26 DIAGNOSIS — Z20822 Contact with and (suspected) exposure to covid-19: Secondary | ICD-10-CM | POA: Diagnosis present

## 2021-06-26 DIAGNOSIS — Y836 Removal of other organ (partial) (total) as the cause of abnormal reaction of the patient, or of later complication, without mention of misadventure at the time of the procedure: Secondary | ICD-10-CM | POA: Diagnosis present

## 2021-06-26 DIAGNOSIS — K651 Peritoneal abscess: Secondary | ICD-10-CM | POA: Diagnosis present

## 2021-06-26 DIAGNOSIS — E1122 Type 2 diabetes mellitus with diabetic chronic kidney disease: Secondary | ICD-10-CM | POA: Diagnosis present

## 2021-06-26 DIAGNOSIS — T8143XA Infection following a procedure, organ and space surgical site, initial encounter: Secondary | ICD-10-CM | POA: Diagnosis present

## 2021-06-26 DIAGNOSIS — K630 Abscess of intestine: Secondary | ICD-10-CM

## 2021-06-26 DIAGNOSIS — I10 Essential (primary) hypertension: Secondary | ICD-10-CM | POA: Diagnosis present

## 2021-06-26 DIAGNOSIS — E440 Moderate protein-calorie malnutrition: Secondary | ICD-10-CM | POA: Diagnosis present

## 2021-06-26 DIAGNOSIS — D696 Thrombocytopenia, unspecified: Secondary | ICD-10-CM | POA: Diagnosis not present

## 2021-06-26 DIAGNOSIS — N189 Chronic kidney disease, unspecified: Secondary | ICD-10-CM | POA: Diagnosis present

## 2021-06-26 DIAGNOSIS — D630 Anemia in neoplastic disease: Secondary | ICD-10-CM | POA: Diagnosis present

## 2021-06-26 DIAGNOSIS — Z789 Other specified health status: Secondary | ICD-10-CM | POA: Diagnosis present

## 2021-06-26 DIAGNOSIS — N390 Urinary tract infection, site not specified: Secondary | ICD-10-CM | POA: Diagnosis not present

## 2021-06-26 DIAGNOSIS — C3490 Malignant neoplasm of unspecified part of unspecified bronchus or lung: Secondary | ICD-10-CM | POA: Diagnosis present

## 2021-06-26 DIAGNOSIS — B999 Unspecified infectious disease: Secondary | ICD-10-CM | POA: Diagnosis not present

## 2021-06-26 DIAGNOSIS — K632 Fistula of intestine: Secondary | ICD-10-CM | POA: Diagnosis present

## 2021-06-26 DIAGNOSIS — R932 Abnormal findings on diagnostic imaging of liver and biliary tract: Secondary | ICD-10-CM

## 2021-06-26 DIAGNOSIS — E876 Hypokalemia: Secondary | ICD-10-CM | POA: Diagnosis present

## 2021-06-26 DIAGNOSIS — E871 Hypo-osmolality and hyponatremia: Secondary | ICD-10-CM | POA: Diagnosis present

## 2021-06-26 DIAGNOSIS — R059 Cough, unspecified: Secondary | ICD-10-CM

## 2021-06-26 DIAGNOSIS — K9413 Enterostomy malfunction: Secondary | ICD-10-CM

## 2021-06-26 DIAGNOSIS — Z1611 Resistance to penicillins: Secondary | ICD-10-CM | POA: Diagnosis present

## 2021-06-26 DIAGNOSIS — E1142 Type 2 diabetes mellitus with diabetic polyneuropathy: Secondary | ICD-10-CM | POA: Diagnosis present

## 2021-06-26 DIAGNOSIS — C7800 Secondary malignant neoplasm of unspecified lung: Secondary | ICD-10-CM | POA: Diagnosis present

## 2021-06-26 DIAGNOSIS — I129 Hypertensive chronic kidney disease with stage 1 through stage 4 chronic kidney disease, or unspecified chronic kidney disease: Secondary | ICD-10-CM | POA: Diagnosis present

## 2021-06-26 DIAGNOSIS — B9620 Unspecified Escherichia coli [E. coli] as the cause of diseases classified elsewhere: Secondary | ICD-10-CM | POA: Diagnosis not present

## 2021-06-26 DIAGNOSIS — C186 Malignant neoplasm of descending colon: Secondary | ICD-10-CM | POA: Diagnosis present

## 2021-06-26 DIAGNOSIS — E663 Overweight: Secondary | ICD-10-CM | POA: Diagnosis not present

## 2021-06-26 DIAGNOSIS — Z1619 Resistance to other specified beta lactam antibiotics: Secondary | ICD-10-CM | POA: Diagnosis present

## 2021-06-26 DIAGNOSIS — Z87442 Personal history of urinary calculi: Secondary | ICD-10-CM

## 2021-06-26 DIAGNOSIS — Z515 Encounter for palliative care: Secondary | ICD-10-CM

## 2021-06-26 DIAGNOSIS — E875 Hyperkalemia: Secondary | ICD-10-CM | POA: Diagnosis not present

## 2021-06-26 DIAGNOSIS — Z833 Family history of diabetes mellitus: Secondary | ICD-10-CM

## 2021-06-26 DIAGNOSIS — Z803 Family history of malignant neoplasm of breast: Secondary | ICD-10-CM

## 2021-06-26 DIAGNOSIS — Z6825 Body mass index (BMI) 25.0-25.9, adult: Secondary | ICD-10-CM

## 2021-06-26 DIAGNOSIS — E785 Hyperlipidemia, unspecified: Secondary | ICD-10-CM | POA: Diagnosis not present

## 2021-06-26 DIAGNOSIS — L259 Unspecified contact dermatitis, unspecified cause: Secondary | ICD-10-CM | POA: Diagnosis present

## 2021-06-26 DIAGNOSIS — Z794 Long term (current) use of insulin: Secondary | ICD-10-CM

## 2021-06-26 LAB — RESP PANEL BY RT-PCR (FLU A&B, COVID) ARPGX2
Influenza A by PCR: NEGATIVE
Influenza B by PCR: NEGATIVE
SARS Coronavirus 2 by RT PCR: NEGATIVE

## 2021-06-26 LAB — CBC WITH DIFFERENTIAL/PLATELET
Abs Immature Granulocytes: 0.04 10*3/uL (ref 0.00–0.07)
Basophils Absolute: 0 10*3/uL (ref 0.0–0.1)
Basophils Relative: 1 %
Eosinophils Absolute: 0.4 10*3/uL (ref 0.0–0.5)
Eosinophils Relative: 4 %
HCT: 28.6 % — ABNORMAL LOW (ref 39.0–52.0)
Hemoglobin: 8.4 g/dL — ABNORMAL LOW (ref 13.0–17.0)
Immature Granulocytes: 1 %
Lymphocytes Relative: 19 %
Lymphs Abs: 1.6 10*3/uL (ref 0.7–4.0)
MCH: 27.1 pg (ref 26.0–34.0)
MCHC: 29.4 g/dL — ABNORMAL LOW (ref 30.0–36.0)
MCV: 92.3 fL (ref 80.0–100.0)
Monocytes Absolute: 0.7 10*3/uL (ref 0.1–1.0)
Monocytes Relative: 9 %
Neutro Abs: 5.5 10*3/uL (ref 1.7–7.7)
Neutrophils Relative %: 66 %
Platelets: 260 10*3/uL (ref 150–400)
RBC: 3.1 MIL/uL — ABNORMAL LOW (ref 4.22–5.81)
RDW: 19 % — ABNORMAL HIGH (ref 11.5–15.5)
WBC: 8.1 10*3/uL (ref 4.0–10.5)
nRBC: 0 % (ref 0.0–0.2)

## 2021-06-26 LAB — COMPREHENSIVE METABOLIC PANEL
ALT: 18 U/L (ref 0–44)
ALT: 20 U/L (ref 0–44)
AST: 24 U/L (ref 15–41)
AST: 26 U/L (ref 15–41)
Albumin: 2 g/dL — ABNORMAL LOW (ref 3.5–5.0)
Albumin: 2.4 g/dL — ABNORMAL LOW (ref 3.5–5.0)
Alkaline Phosphatase: 132 U/L — ABNORMAL HIGH (ref 38–126)
Alkaline Phosphatase: 168 U/L — ABNORMAL HIGH (ref 38–126)
Anion gap: 10 (ref 5–15)
Anion gap: 4 — ABNORMAL LOW (ref 5–15)
BUN: 18 mg/dL (ref 8–23)
BUN: 19 mg/dL (ref 8–23)
CO2: 21 mmol/L — ABNORMAL LOW (ref 22–32)
CO2: 25 mmol/L (ref 22–32)
Calcium: 7.2 mg/dL — ABNORMAL LOW (ref 8.9–10.3)
Calcium: 7.8 mg/dL — ABNORMAL LOW (ref 8.9–10.3)
Chloride: 97 mmol/L — ABNORMAL LOW (ref 98–111)
Chloride: 102 mmol/L (ref 98–111)
Creatinine, Ser: 0.79 mg/dL (ref 0.61–1.24)
Creatinine, Ser: 0.65 mg/dL (ref 0.61–1.24)
GFR, Estimated: >60 mL/min (ref >60)
GFR, Estimated: >60 mL/min (ref >60)
Glucose, Bld: 1153 mg/dL (ref 70–99)
Glucose, Bld: 199 mg/dL — ABNORMAL HIGH (ref 70–99)
Potassium: 4.3 mmol/L (ref 3.5–5.1)
Potassium: 3.1 mmol/L — ABNORMAL LOW (ref 3.5–5.1)
Sodium: 128 mmol/L — ABNORMAL LOW (ref 135–145)
Sodium: 131 mmol/L — ABNORMAL LOW (ref 135–145)
Total Bilirubin: 0.7 mg/dL (ref 0.3–1.2)
Total Bilirubin: 0.6 mg/dL (ref 0.3–1.2)
Total Protein: 6.4 g/dL — ABNORMAL LOW (ref 6.5–8.1)
Total Protein: 7.5 g/dL (ref 6.5–8.1)

## 2021-06-26 LAB — CBG MONITORING, ED
Glucose-Capillary: 194 mg/dL — ABNORMAL HIGH (ref 70–99)
Glucose-Capillary: 188 mg/dL — ABNORMAL HIGH (ref 70–99)

## 2021-06-26 LAB — LACTIC ACID, PLASMA
Lactic Acid, Venous: 1.2 mmol/L (ref 0.5–1.9)
Lactic Acid, Venous: 1.8 mmol/L (ref 0.5–1.9)

## 2021-06-26 LAB — TROPONIN I (HIGH SENSITIVITY)
Troponin I (High Sensitivity): 8 ng/L (ref <18)
Troponin I (High Sensitivity): 11 ng/L (ref <18)

## 2021-06-26 MED ORDER — IOHEXOL 350 MG/ML SOLN
80.0000 mL | Freq: Once | INTRAVENOUS | Status: AC | PRN
  Administered 2021-06-26: 80 mL via INTRAVENOUS

## 2021-06-26 MED ORDER — LORATADINE 10 MG PO TABS
10.0000 mg | ORAL_TABLET | Freq: Every day | ORAL | Status: DC
  Administered 2021-06-27 – 2021-07-25 (×28): 10 mg via ORAL
  Filled 2021-06-26 (×29): qty 1

## 2021-06-26 MED ORDER — PRAVASTATIN SODIUM 20 MG PO TABS
20.0000 mg | ORAL_TABLET | Freq: Every day | ORAL | Status: DC
  Administered 2021-06-27 – 2021-07-01 (×5): 20 mg via ORAL
  Filled 2021-06-26 (×5): qty 1

## 2021-06-26 MED ORDER — LOSARTAN POTASSIUM 25 MG PO TABS
25.0000 mg | ORAL_TABLET | Freq: Every day | ORAL | Status: DC
  Administered 2021-06-27 – 2021-07-25 (×27): 25 mg via ORAL
  Filled 2021-06-26 (×28): qty 1

## 2021-06-26 MED ORDER — PREGABALIN 75 MG PO CAPS
75.0000 mg | ORAL_CAPSULE | Freq: Two times a day (BID) | ORAL | Status: DC
  Administered 2021-06-26 – 2021-07-25 (×57): 75 mg via ORAL
  Filled 2021-06-26 (×58): qty 1

## 2021-06-26 MED ORDER — ONDANSETRON HCL 4 MG PO TABS: 4.0000 mg | ORAL_TABLET | Freq: Four times a day (QID) | ORAL | Status: DC | PRN

## 2021-06-26 MED ORDER — ONDANSETRON HCL 4 MG/2ML IJ SOLN
4.0000 mg | Freq: Four times a day (QID) | INTRAMUSCULAR | Status: DC | PRN
  Administered 2021-06-30: 4 mg via INTRAVENOUS
  Filled 2021-06-26 (×2): qty 2

## 2021-06-26 MED ORDER — ACETAMINOPHEN 650 MG RE SUPP: 650.0000 mg | Freq: Four times a day (QID) | RECTAL | Status: DC | PRN

## 2021-06-26 MED ORDER — INSULIN ASPART 100 UNIT/ML IJ SOLN
0.0000 [IU] | INTRAMUSCULAR | Status: DC
  Administered 2021-06-26: 3 [IU] via SUBCUTANEOUS
  Administered 2021-06-27: 5 [IU] via SUBCUTANEOUS
  Administered 2021-06-27 (×3): 3 [IU] via SUBCUTANEOUS
  Administered 2021-06-27: 5 [IU] via SUBCUTANEOUS
  Administered 2021-06-28 (×2): 3 [IU] via SUBCUTANEOUS
  Administered 2021-06-28 (×2): 5 [IU] via SUBCUTANEOUS
  Administered 2021-06-28 (×2): 3 [IU] via SUBCUTANEOUS
  Administered 2021-06-28: 5 [IU] via SUBCUTANEOUS
  Administered 2021-06-29 (×2): 3 [IU] via SUBCUTANEOUS
  Administered 2021-06-29: 5 [IU] via SUBCUTANEOUS
  Administered 2021-06-29: 8 [IU] via SUBCUTANEOUS
  Administered 2021-06-29: 5 [IU] via SUBCUTANEOUS
  Administered 2021-06-30: 8 [IU] via SUBCUTANEOUS
  Administered 2021-06-30 – 2021-07-01 (×7): 5 [IU] via SUBCUTANEOUS
  Administered 2021-07-01: 8 [IU] via SUBCUTANEOUS
  Administered 2021-07-01 – 2021-07-02 (×3): 5 [IU] via SUBCUTANEOUS
  Administered 2021-07-02: 3 [IU] via SUBCUTANEOUS
  Administered 2021-07-02: 2 [IU] via SUBCUTANEOUS
  Administered 2021-07-02: 3 [IU] via SUBCUTANEOUS
  Administered 2021-07-02: 8 [IU] via SUBCUTANEOUS
  Administered 2021-07-02: 5 [IU] via SUBCUTANEOUS
  Administered 2021-07-03: 8 [IU] via SUBCUTANEOUS
  Administered 2021-07-03: 5 [IU] via SUBCUTANEOUS
  Administered 2021-07-03: 3 [IU] via SUBCUTANEOUS
  Administered 2021-07-03: 11 [IU] via SUBCUTANEOUS
  Administered 2021-07-03: 8 [IU] via SUBCUTANEOUS
  Administered 2021-07-04 (×2): 15 [IU] via SUBCUTANEOUS
  Administered 2021-07-04 (×2): 11 [IU] via SUBCUTANEOUS
  Administered 2021-07-04: 8 [IU] via SUBCUTANEOUS
  Administered 2021-07-04: 15 [IU] via SUBCUTANEOUS
  Administered 2021-07-05 (×2): 5 [IU] via SUBCUTANEOUS
  Administered 2021-07-05: 8 [IU] via SUBCUTANEOUS
  Administered 2021-07-05: 5 [IU] via SUBCUTANEOUS
  Administered 2021-07-05: 11 [IU] via SUBCUTANEOUS
  Administered 2021-07-06: 5 [IU] via SUBCUTANEOUS
  Administered 2021-07-06: 8 [IU] via SUBCUTANEOUS
  Administered 2021-07-06: 11 [IU] via SUBCUTANEOUS
  Administered 2021-07-07: 7 [IU] via SUBCUTANEOUS
  Administered 2021-07-07: 8 [IU] via SUBCUTANEOUS
  Administered 2021-07-07: 3 [IU] via SUBCUTANEOUS
  Administered 2021-07-08 (×3): 8 [IU] via SUBCUTANEOUS
  Administered 2021-07-08 (×3): 5 [IU] via SUBCUTANEOUS
  Administered 2021-07-09: 2 [IU] via SUBCUTANEOUS
  Administered 2021-07-09: 5 [IU] via SUBCUTANEOUS
  Administered 2021-07-09 (×2): 8 [IU] via SUBCUTANEOUS
  Administered 2021-07-09: 5 [IU] via SUBCUTANEOUS
  Administered 2021-07-10: 3 [IU] via SUBCUTANEOUS
  Administered 2021-07-10 (×2): 5 [IU] via SUBCUTANEOUS
  Administered 2021-07-10: 2 [IU] via SUBCUTANEOUS
  Administered 2021-07-10: 11 [IU] via SUBCUTANEOUS
  Administered 2021-07-11 (×3): 5 [IU] via SUBCUTANEOUS
  Administered 2021-07-11: 3 [IU] via SUBCUTANEOUS
  Administered 2021-07-11: 5 [IU] via SUBCUTANEOUS
  Administered 2021-07-12: 3 [IU] via SUBCUTANEOUS
  Administered 2021-07-12: 5 [IU] via SUBCUTANEOUS
  Administered 2021-07-12 – 2021-07-13 (×3): 3 [IU] via SUBCUTANEOUS
  Administered 2021-07-13: 11 [IU] via SUBCUTANEOUS
  Administered 2021-07-13: 3 [IU] via SUBCUTANEOUS
  Administered 2021-07-13: 5 [IU] via SUBCUTANEOUS
  Administered 2021-07-14 (×2): 3 [IU] via SUBCUTANEOUS
  Administered 2021-07-14: 5 [IU] via SUBCUTANEOUS
  Administered 2021-07-15: 2 [IU] via SUBCUTANEOUS
  Administered 2021-07-15: 8 [IU] via SUBCUTANEOUS
  Administered 2021-07-15: 3 [IU] via SUBCUTANEOUS
  Administered 2021-07-15: 11 [IU] via SUBCUTANEOUS
  Administered 2021-07-15: 5 [IU] via SUBCUTANEOUS
  Administered 2021-07-15: 2 [IU] via SUBCUTANEOUS
  Administered 2021-07-16: 3 [IU] via SUBCUTANEOUS
  Administered 2021-07-16: 8 [IU] via SUBCUTANEOUS
  Administered 2021-07-16 (×2): 3 [IU] via SUBCUTANEOUS
  Administered 2021-07-16: 11 [IU] via SUBCUTANEOUS
  Administered 2021-07-16: 3 [IU] via SUBCUTANEOUS
  Administered 2021-07-17: 5 [IU] via SUBCUTANEOUS
  Administered 2021-07-17: 15 [IU] via SUBCUTANEOUS
  Administered 2021-07-17: 11 [IU] via SUBCUTANEOUS
  Administered 2021-07-17: 5 [IU] via SUBCUTANEOUS
  Administered 2021-07-17 (×2): 3 [IU] via SUBCUTANEOUS
  Administered 2021-07-18: 11 [IU] via SUBCUTANEOUS
  Administered 2021-07-18: 5 [IU] via SUBCUTANEOUS
  Administered 2021-07-18: 3 [IU] via SUBCUTANEOUS
  Administered 2021-07-18: 8 [IU] via SUBCUTANEOUS
  Administered 2021-07-18: 15 [IU] via SUBCUTANEOUS
  Administered 2021-07-18: 5 [IU] via SUBCUTANEOUS
  Administered 2021-07-19: 2 [IU] via SUBCUTANEOUS
  Administered 2021-07-19 (×2): 5 [IU] via SUBCUTANEOUS
  Administered 2021-07-19: 2 [IU] via SUBCUTANEOUS
  Administered 2021-07-19: 3 [IU] via SUBCUTANEOUS
  Administered 2021-07-20 (×2): 5 [IU] via SUBCUTANEOUS
  Administered 2021-07-20: 8 [IU] via SUBCUTANEOUS
  Administered 2021-07-20 (×2): 4 [IU] via SUBCUTANEOUS
  Administered 2021-07-21 (×2): 5 [IU] via SUBCUTANEOUS
  Administered 2021-07-21: 3 [IU] via SUBCUTANEOUS
  Administered 2021-07-21: 5 [IU] via SUBCUTANEOUS
  Administered 2021-07-21: 3 [IU] via SUBCUTANEOUS
  Administered 2021-07-21: 5 [IU] via SUBCUTANEOUS
  Administered 2021-07-21: 3 [IU] via SUBCUTANEOUS
  Administered 2021-07-22: 5 [IU] via SUBCUTANEOUS
  Administered 2021-07-22: 3 [IU] via SUBCUTANEOUS
  Administered 2021-07-22 (×2): 5 [IU] via SUBCUTANEOUS
  Administered 2021-07-23: 3 [IU] via SUBCUTANEOUS
  Administered 2021-07-23: 8 [IU] via SUBCUTANEOUS
  Administered 2021-07-23 (×2): 3 [IU] via SUBCUTANEOUS
  Administered 2021-07-23: 2 [IU] via SUBCUTANEOUS
  Administered 2021-07-24: 3 [IU] via SUBCUTANEOUS
  Administered 2021-07-24 (×2): 2 [IU] via SUBCUTANEOUS
  Administered 2021-07-24: 3 [IU] via SUBCUTANEOUS
  Administered 2021-07-24: 5 [IU] via SUBCUTANEOUS
  Administered 2021-07-25: 3 [IU] via SUBCUTANEOUS
  Filled 2021-06-26 (×137): qty 1

## 2021-06-26 MED ORDER — PANTOPRAZOLE SODIUM 40 MG IV SOLR
40.0000 mg | Freq: Every day | INTRAVENOUS | Status: DC
  Administered 2021-06-26: 40 mg via INTRAVENOUS
  Filled 2021-06-26: qty 40

## 2021-06-26 MED ORDER — MORPHINE SULFATE (PF) 2 MG/ML IV SOLN: 2.0000 mg | INTRAVENOUS | Status: DC | PRN

## 2021-06-26 MED ORDER — ACETAMINOPHEN 325 MG PO TABS
650.0000 mg | ORAL_TABLET | Freq: Four times a day (QID) | ORAL | Status: DC | PRN
  Administered 2021-07-05 – 2021-07-20 (×2): 650 mg via ORAL
  Filled 2021-06-26 (×2): qty 2

## 2021-06-26 MED ORDER — HYDROCODONE-ACETAMINOPHEN 5-325 MG PO TABS: 1.0000 | ORAL_TABLET | ORAL | Status: DC | PRN

## 2021-06-26 NOTE — ED Notes (Addendum)
meds surg floor sent their largest colostomy bag which is not the correct size.  Pt and family states they have no other bags,  Family states these are like speciality bags.  Bag drained and cleaned, but continues to ooze liquid stool.  Towels and chux pads used to keep area clean.  md aware.

## 2021-06-26 NOTE — ED Notes (Signed)
FAX MEDICAL  RELEASE  TO  NURSING SUPV  AT  [facility]  DR  Cinda Quest  MD

## 2021-06-26 NOTE — ED Notes (Signed)
Pt alert, skin warm and dry.  No acute distress.

## 2021-06-26 NOTE — ED Notes (Signed)
EMS called back to notify ED staff that patient has an ileostomy, no colostomy as previously reported.

## 2021-06-26 NOTE — H&P (Signed)
History and Physical    [name] ZTI:[id number] DOB: February 28, 1941 DOA: 06/26/2021  PCP: Miguel Body, MD   Patient coming from: home  I have personally briefly reviewed patient's old medical records in [place]  Chief Complaint: problems with ileostomy  HPI: Miguel Flores is a 80 y.o. 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  Patient was brought to the ED after he ran out of ileostomy bags and noted leaking around the bag as well as blood present in the bag.  He was advised by his PCP to present to the ED.  He denies abdominal pain, vomiting, fever or chills.  Has a mild cough which his son attributes to allergies but  no complaints shortness of breath.   ED course: On arrival, afebrile, BP 153/69, pulse 94 with O2 sat 98% on room air Blood work significant for hemoglobin of 8.4, WBC 8.1, lactic acid 1.8, sodium 131, potassium 3.1 and glucose 199.  COVID and flu negative. Ileostomy content was guaiac positive.  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  Patient apparently had outpatient follow-up scheduled for 8/19.  Review of Systems: As per HPI otherwise all other systems on review of systems negative.    Past Medical History:  Diagnosis Date   Cancer ([place])    skin cancer-nose w  graft,also shoulder- basal cell per pt   Cataract    r eye   Chronic kidney disease    kidney stones 20 y ago per pt   Colon cancer ([place]) 2018   Surgical resection and chemo tx's.   Diabetes mellitus without complication ([place])    Eczema    History of kidney stones    Hyperlipidemia    Hypertension    neuropathy from chemo    Vertigo     Past Surgical History:  Procedure Laterality Date   CATARACT EXTRACTION Left    COLECTOMY WITH COLOSTOMY CREATION/HARTMANN PROCEDURE N/A 02/06/2017   Procedure: COLECTOMY WITH COLOSTOMY CREATION/HARTMANN PROCEDURE;  Surgeon: [name], MD;  Location: [facility] ORS;  Service: General;  Laterality: N/A;   COLON SURGERY     COLONOSCOPY WITH PROPOFOL N/A 09/04/2017   Procedure: COLONOSCOPY WITH PROPOFOL;  Surgeon: Jonathon Bellows, MD;  Location: [facility] ENDOSCOPY;  Service: Gastroenterology;  Laterality: N/A;   COLOSTOMY CLOSURE N/A 10/20/2017   Procedure: COLOSTOMY CLOSURE;  Surgeon: [name], MD;  Location: [facility] ORS;  Service: General;  Laterality: N/A;   CYSTOSCOPY     FLEXIBLE SIGMOIDOSCOPY N/A 02/06/2017   Procedure: FLEXIBLE SIGMOIDOSCOPY;  Surgeon: Christene Lye, MD;  Location: [facility] ENDOSCOPY;  Service: Endoscopy;  Laterality: N/A;   PORTACATH PLACEMENT N/A 06/17/2018   Procedure: INSERTION PORT-A-CATH, WITH FLUOROSCOPY;  Surgeon: [name], MD;  Location: [facility] ORS;  Service: General;  Laterality: N/A;     reports that he has never smoked. He has never used smokeless tobacco. He reports that he does not drink  alcohol and does not use drugs.  No Known Allergies  Family History  Problem Relation Age of Onset   Diabetes Mother    AAA (abdominal aortic aneurysm) Mother    Coronary artery disease Mother    Diabetes Father    Coronary artery disease Father    Dementia Father    Breast cancer Sister       Prior to Admission medications   Medication Sig Start Date End Date Taking? Authorizing Provider  acetaminophen  (TYLENOL) 500 MG tablet Take 1,000 mg by mouth in the morning and at bedtime.   Yes [provider]  aspirin EC 81 MG tablet Take 81 mg by mouth daily.   Yes [provider]  glimepiride (AMARYL) 2 MG tablet Take 2 mg by mouth 2 (two) times daily before a meal.  08/14/15  Yes [provider]  loratadine (CLARITIN) 10 MG tablet Take 10 mg by mouth daily.   Yes [provider]  losartan (COZAAR) 25 MG tablet Take 25 mg by mouth daily. 03/27/16  Yes [provider]  metFORMIN (GLUCOPHAGE) 1000 MG tablet Take 1,000-1,500 tablets by mouth See admin instructions. TAKE 1 TABLET (1000 MG) BY MOUTH IN THE MORNING & TAKE 1.5 TABLETS (1500 MG) BY MOUTH AT SUPPER 08/07/16  Yes [provider]  pravastatin (PRAVACHOL) 20 MG tablet Take 20 mg by mouth daily with supper.  12/27/15  Yes [provider]  pregabalin (LYRICA) 75 MG capsule Take 1 capsule (75 mg total) by mouth 2 (two) times daily. 02/05/21  Yes Sindy Guadeloupe, MD  Apremilast (OTEZLA PO) Take 1 tablet by mouth daily. Patient not taking: No sig reported    [provider]  clobetasol cream (TEMOVATE) 8.75 % Apply 1 application topically 2 (two) times daily as needed. Patient not taking: No sig reported    [provider]  Docusate Sodium (COLACE PO) Take 1 Dose by mouth as needed.    [provider]  Dulaglutide 0.75 MG/0.5ML SOPN Inject 1.5 mg into the skin once a week.  10/04/18   [provider]  hydrOXYzine (ATARAX/VISTARIL) 25 MG tablet Take 25 mg by mouth daily. Patient not taking: No sig reported 03/02/21   [provider]  lidocaine-prilocaine (EMLA) cream Place a small amount over cream over port 1 hour before each treatment, cover cream with saran wrap for clothing protection Patient not taking: No sig reported 07/20/19   Sindy Guadeloupe, MD  [facility] VERIO test strip  01/22/20   [provider]  sertraline (ZOLOFT) 25 MG tablet Take 25  mg by mouth daily. Patient not taking: Reported on 06/26/2021 06/22/21   [provider]  TRULICITY 1.5 IE/3.3IR SOPN Inject 1.5 mg into the skin once a week. 06/17/21   [provider]  prochlorperazine (COMPAZINE) 10 MG tablet Take 1 tablet (10 mg total) by mouth every 6 (six) hours as needed (Nausea or vomiting). Patient not taking: No sig reported 06/18/18 08/12/19  Sindy Guadeloupe, MD    Physical Exam: Vitals:   06/26/21 1900 06/26/21 1915 06/26/21 1930 06/26/21 1943  BP:   (!) 143/57 (!) 143/63  Pulse: 92 88 91 87  Resp:    20  Temp:    97.8 F (36.6 C)  TempSrc:    Oral  SpO2: 100% 98% 98% 98%  Weight:      Height:         Vitals:   06/26/21 1900 06/26/21 1915 06/26/21 1930 06/26/21 1943  BP:   (!) 143/57 (!) 143/63  Pulse: 92 88 91 87  Resp:    20  Temp:    97.8 F (36.6 C)  TempSrc:    Oral  SpO2: 100% 98% 98% 98%  Weight:      Height:          Constitutional: Frail and chronically ill-appearing male, oriented x 3 . Not in any apparent distress HEENT:      Head: Normocephalic and atraumatic.         Eyes: PERLA, EOMI, Conjunctivae are normal. Sclera is non-icteric.       Mouth/Throat: Mucous membranes are moist.       Neck: Supple with no signs of meningismus. Cardiovascular: Regular rate and rhythm. No murmurs, gallops, or rubs. 2+ symmetrical distal pulses are present . No JVD. No LE edema Respiratory: Respiratory effort normal .Lungs sounds clear bilaterally. No wheezes, crackles, or rhonchi.  Gastrointestinal: Soft, non tender, and non distended with positive bowel sounds.  Large ileal stoma draining clear brown liquid Genitourinary: No CVA tenderness. Musculoskeletal: Nontender with normal range of motion in all extremities. No cyanosis, or erythema of extremities. Neurologic:  Face is symmetric. Moving all extremities. No gross focal neurologic deficits . Skin: Skin is warm, dry.  No rash or ulcers Psychiatric: Mood and affect are normal     Labs on Admission: I have personally reviewed following labs and imaging studies  CBC: Recent Labs  Lab 06/26/21 1735  WBC 8.1  NEUTROABS 5.5  HGB 8.4*  HCT 28.6*  MCV 92.3  PLT 409   Basic Metabolic Panel: Recent Labs  Lab 06/26/21 1735 06/26/21 1907  NA 128* 131*  K 4.3 3.1*  CL 97* 102  CO2 21* 25  GLUCOSE 1,153* 199*  BUN 18 19  CREATININE 0.79 0.65  CALCIUM 7.2* 7.8*   GFR: Estimated Creatinine Clearance: 70.8 mL/min (by C-G formula based on SCr of 0.65 mg/dL). Liver Function Tests: Recent Labs  Lab 06/26/21 1735 06/26/21 1907  AST 24 26  ALT 18 20  ALKPHOS 132* 168*  BILITOT 0.7 0.6  PROT 6.4* 7.5  ALBUMIN 2.0* 2.4*   No results for input(s): LIPASE, AMYLASE in the last 168 hours. No results for input(s): AMMONIA in the last 168 hours. Coagulation Profile: No results for input(s): INR, PROTIME in the last 168 hours. Cardiac Enzymes: No results for input(s): CKTOTAL, CKMB, CKMBINDEX, TROPONINI in the last 168 hours. BNP (last 3 results) No results for input(s): PROBNP in the last 8760 hours. HbA1C: No results for input(s): HGBA1C in the last 72 hours. CBG: Recent Labs  Lab 06/26/21 1812  GLUCAP 194*   Lipid Profile: No results for input(s): CHOL, HDL, LDLCALC, TRIG, CHOLHDL, LDLDIRECT in the last 72 hours. Thyroid Function Tests: No results for input(s): TSH, T4TOTAL, FREET4, T3FREE, THYROIDAB in the last 72 hours. Anemia Panel: No results for input(s): VITAMINB12, FOLATE, FERRITIN, TIBC, IRON, RETICCTPCT in the last 72 hours. Urine analysis:    Component Value Date/Time   COLORURINE YELLOW (A) 02/05/2017 1752   APPEARANCEUR HAZY (A) 02/05/2017 1752   LABSPEC 1.027 02/05/2017 1752   PHURINE 5.0 02/05/2017 1752   GLUCOSEU 150 (A) 02/05/2017 1752   HGBUR NEGATIVE 02/05/2017 1752   BILIRUBINUR NEGATIVE 02/05/2017 1752   KETONESUR 5 (A) 02/05/2017 1752   PROTEINUR 30 (A) 02/05/2017 1752   NITRITE NEGATIVE 02/05/2017 1752    LEUKOCYTESUR NEGATIVE 02/05/2017 1752    Radiological Exams on Admission: CT ABDOMEN PELVIS W CONTRAST  Result  Date: 06/26/2021 CLINICAL DATA:  Ileostomy bag leakage EXAM: CT ABDOMEN AND PELVIS WITH CONTRAST TECHNIQUE: Multidetector CT imaging of the abdomen and pelvis was performed using the standard protocol following bolus administration of intravenous contrast. CONTRAST:  11mL OMNIPAQUE IOHEXOL 350 MG/ML SOLN COMPARISON:  CT 03/13/2021, 11/28/2020 FINDINGS: Lower chest: Lung bases demonstrate small bilateral pleural effusions. Atelectasis or minimal pneumonia at the bases. 16 x 14 mm right lower lobe pulmonary nodule, previously 14 x 13 mm. Irregular lingular pulmonary nodule measuring 13 x 10 mm, previously 16 x 12 mm. Normal cardiac size. Hepatobiliary: Status post cholecystectomy. No focal hepatic abnormality. No intra or extrahepatic biliary dilatation Pancreas: Unremarkable. No pancreatic ductal dilatation or surrounding inflammatory changes. Spleen: Normal in size without focal abnormality. Adrenals/Urinary Tract: Adrenal glands are normal. Kidneys show no hydronephrosis. The bladder is normal Stomach/Bowel: The stomach is nonenlarged. Status post distal left colectomy with reanastomosis. Collapsed appearing colon. Contrast opacifies nondilated small bowel. Interval postsurgical changes of the small bowel. Large ventral wound containing contrast which appears to communicate with thickened small bowel deep to the wound, series 2, image 61. Vascular/Lymphatic: Moderate aortic atherosclerosis. No aneurysm. No suspicious nodes. Reproductive: Prostate calcifications without mass Other: No free air. Multiple mildly rim enhancing intra-abdominal fluid collections. 5 x 2.6 cm right mid abdominal collection, series 2, image 44. 2.4 x 1.8 cm fluid collection in the right mid lateral abdomen adjacent to small bowel, series 2, image 47 5.2 x 1.3 cm lenticular shaped collection within the left mid abdomen,  coronal series 6, image 32. Multiple additional smaller rim enhancing collections within the abdomen concerning for abscess. Small rim enhancing perihepatic and perisplenic fluid collections. generalized soft tissue stranding within the mesentery which is new compared to prior. Musculoskeletal: No acute or significant osseous findings. IMPRESSION: 1. Large ventral wound containing small amount of contrast. Postsurgical changes of small bowel deep to the wound with enteral contrast from the small bowel extending into the large ventral wound suggesting enterocutaneous fistula. 2. Multiple intra-abdominal rim enhancing fluid collections as described above concerning for intra-abdominal abscess. Generalized soft tissue stranding throughout the mesentery, likely inflammatory. 3. Small bilateral pleural effusions with atelectasis or pneumonia at the bases. Metastatic lung nodules, slight increased size of right lower lobe metastatic nodule. Electronically Signed   By: Donavan Foil M.D.   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     Ileostomy dysfunction, with leakage, hematochezia - Patient had ileostomy around 7/13 related to bowel perforation from cholecystectomy - Persistent leaking since discharge from hospital 8/13 - Blood and ileostomy bag - Surgical consult - Wound/ostomy care consult    Intra-abdominal abscess s/p cholecystectomy on 04/17/6719  complicated by bowel perforation requiring ileostomy - CT with multiple intra-abdominal abscesses and inflammatory stranding of mesentery - Surgical consult - Patient appears  asymptomatic without fever, abdominal pain, vomiting.  No leukocytosis and lactic acid WNL - [facility]wait[facility] in [place] - Will defer initiation of antibiotics to surgical team  Hematochezia - Hemoglobin 8.4.  Most recent baseline unknown - Stool guaiac positive - Serial H&H - Clear liquid diet and n.p.o. from midnight - IV Protonix -  GI  evaluation  On TPN and oral nutrition - Pharmacy consult for continuation of TPN - Dietary consult - Dysphagia 3 diet (discussed with son) - Patient currently on home health but family might be interested in SNF given patient's increased needs    Essential hypertension - Stable.  Continue losartan    Type 2 diabetes mellitus without complication, without long-term current use of insulin (HCC) - Sliding scale insulin.  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  Given opportunity to ask questions.  Discussed CODE STATUS and patient is a full code Disposition Plan: Possibly SNF Consults called: surgery  Status:At the time of admission, it appears that the appropriate admission status for this patient is INPATIENT. This is judged to be reasonable and necessary in order to provide the required intensity of service to ensure the patient's safety given the presenting symptoms, physical exam findings, and initial radiographic and laboratory data in the context of their  Comorbid conditions.   Patient requires inpatient status due to high intensity of service, high risk for further deterioration and high frequency of surveillance required.   I certify that at the point of admission it is my clinical judgment that the patient will require  inpatient hospital care spanning beyond [place] MD [facility]     06/26/2021, 10:00 PM

## 2021-06-26 NOTE — ED Notes (Signed)
Pt signed medical release form for medical records at [facility] [place].

## 2021-06-26 NOTE — ED Notes (Signed)
Request  for  medical  records sent  to  [facility]  per  DR Cinda Quest  MD

## 2021-06-26 NOTE — ED Notes (Addendum)
fsbs 194   Dr Cinda Quest aware   Pt alert and is in ct scan    Pt alert and oriented  Skin warm and dry

## 2021-06-26 NOTE — ED Notes (Signed)
Pt chux changed, new provided. Warm blankets provided

## 2021-06-26 NOTE — ED Notes (Signed)
Pt has a picc line in right upper arm.  Labs sent from line.  Family with pt.  Pt has oozing of stool from ileostomy bag.

## 2021-06-26 NOTE — ED Triage Notes (Signed)
Pt comes into the ED via [facility] from home c/o colostomy leakage with blood present as well as problems with the site.  Pt's MD told him to come to be evaluated.

## 2021-06-26 NOTE — ED Notes (Signed)
Phlebotomy contacted about obtaining labs for patient.

## 2021-06-26 NOTE — ED Notes (Signed)
Pt in ct scan  

## 2021-06-26 NOTE — Progress Notes (Addendum)
SURGICAL CONSULTATION NOTE   HISTORY OF PRESENT ILLNESS (HPI):  80 y.o. male presented to [facility] ED for evaluation of "colostomy leaking".  Patient reports feeling well.  Most of the history taken by son was bedside preparing the TPN for the patient.  Son reports that the patient had acute abdominal pain on July 7.  He went to an hospital at [facility] and he was found with cholecystitis.  He underwent cholecystectomy.  Few days later he was getting septic and he had exploratory laparotomy.  He was found with small bowel injury that was repaired.  He was left with open abdomen and had reopening of recent laparotomy without any new findings on the abdominal wall was closed.  Couple of weeks later he was walking and a bunch of fluid came out from the abdominal wound.  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  He was discharged with few bags for collection of enterocutaneous drainage and TPN.  He had a virtual visit with PCP and due to issues with the ileostomy appliances and local care he was referred to the ED for further evaluation.  At the ED he was found with a leukocytosis.  New CT scan of the abdomen pelvis shows multiple intra-abdominal abscesses.  There is also contrast in the midline abdominal wound.  I personally evaluated the images.  Surgery is consulted by Dr. Rip Harbour in this context for evaluation and management of multiple intra-abdominal abscess.  PAST MEDICAL HISTORY (PMH):  Past Medical History:  Diagnosis Date   Cancer ([place])    skin cancer-nose w graft,also shoulder- basal cell per pt   Cataract    r eye   Chronic kidney disease    kidney stones 20 y ago per pt   Colon cancer ([place]) 2018   Surgical resection and chemo tx's.   Diabetes mellitus without complication ([place])    Eczema    History of kidney stones    Hyperlipidemia    Hypertension    neuropathy from chemo    Vertigo      PAST SURGICAL HISTORY ([place]):  Past Surgical History:  Procedure Laterality  Date   CATARACT EXTRACTION Left    COLECTOMY WITH COLOSTOMY CREATION/HARTMANN PROCEDURE N/A 02/06/2017   Procedure: COLECTOMY WITH COLOSTOMY CREATION/HARTMANN PROCEDURE;  Surgeon: [name], MD;  Location: [facility] ORS;  Service: General;  Laterality: N/A;   COLON SURGERY     COLONOSCOPY WITH PROPOFOL N/A 09/04/2017   Procedure: COLONOSCOPY WITH PROPOFOL;  Surgeon: Jonathon Bellows, MD;  Location: [facility] ENDOSCOPY;  Service: Gastroenterology;  Laterality: N/A;   COLOSTOMY CLOSURE N/A 10/20/2017   Procedure: COLOSTOMY CLOSURE;  Surgeon: [name], MD;  Location: [facility] ORS;  Service: General;  Laterality: N/A;   CYSTOSCOPY     FLEXIBLE SIGMOIDOSCOPY N/A 02/06/2017   Procedure: FLEXIBLE SIGMOIDOSCOPY;  Surgeon: Christene Lye, MD;  Location: [facility] ENDOSCOPY;  Service: Endoscopy;  Laterality: N/A;   PORTACATH PLACEMENT N/A 06/17/2018   Procedure: INSERTION PORT-A-CATH, WITH FLUOROSCOPY;  Surgeon: [name], MD;  Location: [facility] ORS;  Service: General;  Laterality: N/A;     MEDICATIONS:  Prior to Admission medications   Medication Sig Start Date End Date Taking? Authorizing Provider  acetaminophen (TYLENOL) 500 MG tablet Take 1,000 mg by mouth in the morning and at bedtime.   Yes [provider]  aspirin EC 81 MG tablet Take 81 mg by mouth daily.   Yes [provider]  glimepiride (AMARYL) 2 MG tablet Take 2 mg by mouth  2 (two) times daily before a meal.  08/14/15  Yes [provider]  loratadine (CLARITIN) 10 MG tablet Take 10 mg by mouth daily.   Yes [provider]  losartan (COZAAR) 25 MG tablet Take 25 mg by mouth daily. 03/27/16  Yes [provider]  metFORMIN (GLUCOPHAGE) 1000 MG tablet Take 1,000-1,500 tablets by mouth See admin instructions. TAKE 1 TABLET (1000 MG) BY MOUTH IN THE MORNING & TAKE 1.5 TABLETS (1500 MG) BY MOUTH AT SUPPER 08/07/16  Yes [provider]  pravastatin (PRAVACHOL) 20 MG tablet Take 20 mg by mouth  daily with supper.  12/27/15  Yes [provider]  pregabalin (LYRICA) 75 MG capsule Take 1 capsule (75 mg total) by mouth 2 (two) times daily. 02/05/21  Yes Sindy Guadeloupe, MD  Apremilast (OTEZLA PO) Take 1 tablet by mouth daily. Patient not taking: No sig reported    [provider]  clobetasol cream (TEMOVATE) 5.73 % Apply 1 application topically 2 (two) times daily as needed. Patient not taking: No sig reported    [provider]  Docusate Sodium (COLACE PO) Take 1 Dose by mouth as needed.    [provider]  Dulaglutide 0.75 MG/0.5ML SOPN Inject 1.5 mg into the skin once a week.  10/04/18   [provider]  hydrOXYzine (ATARAX/VISTARIL) 25 MG tablet Take 25 mg by mouth daily. Patient not taking: No sig reported 03/02/21   [provider]  lidocaine-prilocaine (EMLA) cream Place a small amount over cream over port 1 hour before each treatment, cover cream with saran wrap for clothing protection Patient not taking: No sig reported 07/20/19   Sindy Guadeloupe, MD  [facility] VERIO test strip  01/22/20   [provider]  sertraline (ZOLOFT) 25 MG tablet Take 25 mg by mouth daily. Patient not taking: Reported on 06/26/2021 06/22/21   [provider]  TRULICITY 1.5 UK/0.2RK SOPN Inject 1.5 mg into the skin once a week. 06/17/21   [provider]  prochlorperazine (COMPAZINE) 10 MG tablet Take 1 tablet (10 mg total) by mouth every 6 (six) hours as needed (Nausea or vomiting). Patient not taking: No sig reported 06/18/18 08/12/19  Sindy Guadeloupe, MD     ALLERGIES:  No Known Allergies   SOCIAL HISTORY:  Social History   Socioeconomic History   Marital status: Married    Spouse name: Not on file   Number of children: Not on file   Years of education: Not on file   Highest education level: Not on file  Occupational History   Not on file  Tobacco Use   Smoking status: Never   Smokeless tobacco: Never  Vaping Use   Vaping  Use: Never used  Substance and Sexual Activity   Alcohol use: No   Drug use: No   Sexual activity: Not Currently  Other Topics Concern   Not on file  Social History Narrative   Not on file   Social Determinants of Health   Financial Resource Strain: Not on file  Food Insecurity: Not on file  Transportation Needs: Not on file  Physical Activity: Not on file  Stress: Not on file  Social Connections: Not on file  Intimate Partner Violence: Not on file      FAMILY HISTORY:  Family History  Problem Relation Age of Onset   Diabetes Mother    AAA (abdominal aortic aneurysm) Mother    Coronary artery disease Mother    Diabetes Father  Coronary artery disease Father    Dementia Father    Breast cancer Sister      REVIEW OF SYSTEMS:  Constitutional: denies weight loss, fever, chills, or sweats  Eyes: denies any other vision changes, history of eye injury  ENT: denies sore throat, hearing problems  Respiratory: denies shortness of breath, wheezing  Cardiovascular: denies chest pain, palpitations  Gastrointestinal: Denies abdominal pain, nausea and vomiting Genitourinary: denies burning with urination or urinary frequency Musculoskeletal: denies any other joint pains or cramps  Skin: denies any other rashes or skin discolorations  Neurological: denies any other headache, dizziness, weakness  Psychiatric: denies any other depression, anxiety   All other review of systems were negative   VITAL SIGNS:  Temp:  [97.8 F (36.6 C)-98.6 F (37 C)] 97.8 F (36.6 C) (08/17 1943) Pulse Rate:  [86-102] 87 (08/17 1943) Resp:  [16-20] 20 (08/17 1943) BP: (143-157)/(48-71) 143/63 (08/17 1943) SpO2:  [96 %-100 %] 98 % (08/17 1943) Weight:  [77.1 kg] 77.1 kg (08/17 1231)     Height: 5\' 6"  (167.6 cm) Weight: 77.1 kg BMI (Calculated): 27.45   INTAKE/OUTPUT:  This shift: No intake/output data recorded.  Last 2 shifts: @IOLAST2SHIFTS @   PHYSICAL EXAM:  Constitutional:  -- Normal  body habitus  -- Awake, alert, and oriented x3  Eyes:  -- Pupils equally round and reactive to light  -- No scleral icterus  Ear, nose, and throat:  -- No jugular venous distension  Pulmonary:  -- No crackles  -- Equal breath sounds bilaterally -- Breathing non-labored at rest Cardiovascular:  -- S1, S2 present  -- No pericardial rubs Gastrointestinal:  -- Abdomen soft, open midline wound with visible enterocutaneous fistula.  There is enteric bilious output. Musculoskeletal and Integumentary:  -- Wounds: None appreciated -- Extremities: B/L UE and LE FROM, hands and feet warm, no edema  Neurologic:  -- Motor function: intact and symmetric -- Sensation: intact and symmetric   Labs:  CBC Latest Ref Rng & Units 06/26/2021 05/07/2021 04/16/2021  WBC 4.0 - 10.5 K/uL 8.1 8.0 7.0  Hemoglobin 13.0 - 17.0 g/dL 8.4(L) 10.3(L) 9.7(L)  Hematocrit 39.0 - 52.0 % 28.6(L) 33.4(L) 31.9(L)  Platelets 150 - 400 K/uL 260 194 205   CMP Latest Ref Rng & Units 06/26/2021 06/26/2021 05/07/2021  Glucose 70 - 99 mg/dL 199(H) 1,153(HH) 275(H)  BUN 8 - 23 mg/dL 19 18 16   Creatinine 0.61 - 1.24 mg/dL 0.65 0.79 0.96  Sodium 135 - 145 mmol/L 131(L) 128(L) 133(L)  Potassium 3.5 - 5.1 mmol/L 3.1(L) 4.3 4.1  Chloride 98 - 111 mmol/L 102 97(L) 99  CO2 22 - 32 mmol/L 25 21(L) 22  Calcium 8.9 - 10.3 mg/dL 7.8(L) 7.2(L) 9.2  Total Protein 6.5 - 8.1 g/dL 7.5 6.4(L) 7.2  Total Bilirubin 0.3 - 1.2 mg/dL 0.6 0.7 1.2  Alkaline Phos 38 - 126 U/L 168(H) 132(H) 94  AST 15 - 41 U/L 26 24 35  ALT 0 - 44 U/L 20 18 30     Imaging studies:  EXAM: CT ABDOMEN AND PELVIS WITH CONTRAST   TECHNIQUE: Multidetector CT imaging of the abdomen and pelvis was performed using the standard protocol following bolus administration of intravenous contrast.   CONTRAST:  63mL OMNIPAQUE IOHEXOL 350 MG/ML SOLN   COMPARISON:  CT 03/13/2021, 11/28/2020   FINDINGS: Lower chest: Lung bases demonstrate small bilateral pleural effusions.  Atelectasis or minimal pneumonia at the bases. 16 x 14 mm right lower lobe pulmonary nodule, previously 14 x 13 mm. Irregular  lingular pulmonary nodule measuring 13 x 10 mm, previously 16 x 12 mm. Normal cardiac size.   Hepatobiliary: Status post cholecystectomy. No focal hepatic abnormality. No intra or extrahepatic biliary dilatation   Pancreas: Unremarkable. No pancreatic ductal dilatation or surrounding inflammatory changes.   Spleen: Normal in size without focal abnormality.   Adrenals/Urinary Tract: Adrenal glands are normal. Kidneys show no hydronephrosis. The bladder is normal   Stomach/Bowel: The stomach is nonenlarged. Status post distal left colectomy with reanastomosis. Collapsed appearing colon. Contrast opacifies nondilated small bowel. Interval postsurgical changes of the small bowel. Large ventral wound containing contrast which appears to communicate with thickened small bowel deep to the wound, series 2, image 61.   Vascular/Lymphatic: Moderate aortic atherosclerosis. No aneurysm. No suspicious nodes.   Reproductive: Prostate calcifications without mass   Other: No free air. Multiple mildly rim enhancing intra-abdominal fluid collections. 5 x 2.6 cm right mid abdominal collection, series 2, image 44. 2.4 x 1.8 cm fluid collection in the right mid lateral abdomen adjacent to small bowel, series 2, image 47 5.2 x 1.3 cm lenticular shaped collection within the left mid abdomen, coronal series 6, image 32. Multiple additional smaller rim enhancing collections within the abdomen concerning for abscess. Small rim enhancing perihepatic and perisplenic fluid collections. generalized soft tissue stranding within the mesentery which is new compared to prior.   Musculoskeletal: No acute or significant osseous findings.   IMPRESSION: 1. Large ventral wound containing small amount of contrast. Postsurgical changes of small bowel deep to the wound with enteral contrast  from the small bowel extending into the large ventral wound suggesting enterocutaneous fistula. 2. Multiple intra-abdominal rim enhancing fluid collections as described above concerning for intra-abdominal abscess. Generalized soft tissue stranding throughout the mesentery, likely inflammatory. 3. Small bilateral pleural effusions with atelectasis or pneumonia at the bases. Metastatic lung nodules, slight increased size of right lower lobe metastatic nodule.     Electronically Signed   By: Donavan Foil M.D.   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  Patient with recent no prior surgery complicated with small bowel injury.  Patient needed multiple surgeries and eventually ended up with enterocutaneous fistula.  On new CT scan today he was found with multiple intra-abdominal abscess.  He does not have any abdominal pain.  He does not have any fever.  He does not have a white blood cell count.  Son at bedside said that he has all the CT scan done at the previous admission in CDs.  I told the son to bring all the CDs to compare the new CT scan with the all CT scans.  This will help making decision regarding if he needs to have the fluid collections drained percutaneously.  I do not recommend any surgical management acutely due to a hostile abdomen.  As per son patient has an appointment with Dr. Dahlia Byes this Friday.  My recommendation is that the goal of this admission should be to found a local care that will be comfortable for him to collect the enterocutaneous fistula drainage.  I recommend to consult wound care nurse to assist to find appropriate bag for these intermittent fistula.  If this is achieved patient will be able to heal for a subsequent surgery.  Decision regarding drainage of intra-abdominal abscess to be done comparing previous CT scans. No contraindication for  diet at this moment.  Agreed to continue TPN as patient was having  before.  Arnold Long, MD

## 2021-06-26 NOTE — ED Notes (Signed)
Pt reports his ileostomy is leaking.  Pt denies any pain.  Sx for several days.    Family with pt.  Pt alert speech clear.

## 2021-06-26 NOTE — ED Notes (Signed)
Lab in with pt now °

## 2021-06-26 NOTE — ED Provider Notes (Signed)
[facility] Emergency Department Provider Note   ____________________________________________   Event Date/Time   First MD Initiated Contact with Patient 06/26/21 1637     (approximate)  I have reviewed the triage vital signs and the nursing notes.   HISTORY  Chief Complaint Colostomy problem    HPI Miguel Flores is a 80 y.o. male who is sent by Dr. Netty Starring through telemedicine to come to the hospital for possible revision.  He had gallbladder surgery in [facility] with a nicked bowel and 2 other surgeries afterwards ended up with a ileostomy or colostomy.  He is now out of ostomy bags and the current ostomy bag is leaking and not attached to the skin any longer.  Additionally he has blood in the ostomy discharge in the ostomy discharge is Hemoccult positive here.  He is not having any pain.  He is not running a fever.  He is not having any other problems.  This is likely an ileostomy based on the liquid nature of the output. There are no pertinent records in care everywhere or chart review here.  We are going to attempt to contact [facility] where the surgery was apparently done.      Past Medical History:  Diagnosis Date   Cancer ([place])    skin cancer-nose w graft,also shoulder- basal cell per pt   Cataract    r eye   Chronic kidney disease    kidney stones 20 y ago per pt   Colon cancer ([place]) 2018   Surgical resection and chemo tx's.   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   On total parenteral nutrition (TPN) 06/26/2021   Goals of care, counseling/discussion 12/20/2018   Lung metastases ([place]) 06/18/2018   Cancer of left  colon ([place]) 10/20/2017   Pure hypercholesterolemia 08/14/2017   Vaccine counseling 03/09/2017   Essential hypertension 02/20/2017   Mixed hyperlipidemia 02/20/2017   Type 2 diabetes mellitus without complication, without long-term current use of insulin ([place]) 02/20/2017   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   Colonic mass    Large bowel obstruction ([place]) 02/05/2017   Senile purpura ([place]) 10/07/2016   Chronic eczema 02/04/2016   Normocytic anemia 02/04/2016    Past Surgical History:  Procedure Laterality Date   CATARACT EXTRACTION Left    COLECTOMY WITH COLOSTOMY CREATION/HARTMANN PROCEDURE N/A 02/06/2017   Procedure: COLECTOMY WITH COLOSTOMY CREATION/HARTMANN PROCEDURE;  Surgeon: [name], MD;  Location: [facility] ORS;  Service: General;  Laterality: N/A;   COLON SURGERY     COLONOSCOPY WITH PROPOFOL N/A 09/04/2017   Procedure: COLONOSCOPY WITH PROPOFOL;  Surgeon: Jonathon Bellows, MD;  Location: [facility] ENDOSCOPY;  Service: Gastroenterology;  Laterality: N/A;   COLOSTOMY CLOSURE N/A 10/20/2017   Procedure: COLOSTOMY CLOSURE;  Surgeon: [name], MD;  Location: [facility] ORS;  Service: General;  Laterality: N/A;   CYSTOSCOPY     FLEXIBLE SIGMOIDOSCOPY N/A 02/06/2017   Procedure: FLEXIBLE SIGMOIDOSCOPY;  Surgeon: Christene Lye, MD;  Location: [facility] ENDOSCOPY;  Service: Endoscopy;  Laterality: N/A;   PORTACATH PLACEMENT N/A 06/17/2018   Procedure: INSERTION PORT-A-CATH, WITH FLUOROSCOPY;  Surgeon: [name], MD;  Location: [facility] ORS;  Service: General;  Laterality: N/A;    Prior to Admission medications   Medication Sig Start Date End Date Taking? Authorizing Provider  acetaminophen (TYLENOL) 500 MG tablet Take 1,000 mg by mouth in the morning and at bedtime.   Yes [provider]  aspirin EC 81 MG tablet Take 81 mg by mouth daily.   Yes [provider]  glimepiride (AMARYL) 2 MG tablet Take 2 mg by mouth 2 (two) times daily before a meal.  08/14/15  Yes  [provider]  loratadine (CLARITIN) 10 MG tablet Take 10 mg by mouth daily.   Yes [provider]  losartan (COZAAR) 25 MG tablet Take 25 mg by mouth daily. 03/27/16  Yes [provider]  metFORMIN (GLUCOPHAGE) 1000 MG tablet Take 1,000-1,500 tablets by mouth See admin instructions. TAKE 1 TABLET (1000 MG) BY MOUTH IN THE MORNING & TAKE 1.5 TABLETS (1500 MG) BY MOUTH AT SUPPER 08/07/16  Yes [provider]  pravastatin (PRAVACHOL) 20 MG tablet Take 20 mg by mouth daily with supper.  12/27/15  Yes [provider]  pregabalin (LYRICA) 75 MG capsule Take 1 capsule (75 mg total) by mouth 2 (two) times daily. 02/05/21  Yes Sindy Guadeloupe, MD  Apremilast (OTEZLA PO) Take 1 tablet by mouth daily. Patient not taking: No sig reported    [provider]  clobetasol cream (TEMOVATE) 6.56 % Apply 1 application topically 2 (two) times daily as needed. Patient not taking: No sig reported    [provider]  Docusate Sodium (COLACE PO) Take 1 Dose by mouth as needed.    [provider]  Dulaglutide 0.75 MG/0.5ML SOPN Inject 1.5 mg into the skin once a week.  10/04/18   [provider]  hydrOXYzine (ATARAX/VISTARIL) 25 MG tablet Take 25 mg by mouth daily. Patient not taking: No sig reported 03/02/21   [provider]  lidocaine-prilocaine (EMLA) cream Place a small amount over cream over port 1 hour before each treatment, cover cream with saran wrap for clothing protection Patient not taking: No sig reported 07/20/19   Sindy Guadeloupe, MD  [facility] VERIO test strip  01/22/20   [provider]  sertraline (ZOLOFT) 25 MG tablet Take 25 mg by mouth daily. Patient not taking: Reported on 06/26/2021 06/22/21   [provider]  TRULICITY 1.5 CL/2.7NT SOPN Inject 1.5 mg into the skin once a week. 06/17/21   [provider]  prochlorperazine (COMPAZINE) 10 MG tablet Take 1 tablet (10 mg total) by mouth every 6  (six) hours as needed (Nausea or vomiting). Patient not taking: No sig reported 06/18/18 08/12/19  Sindy Guadeloupe, MD    Allergies Patient has no known allergies.  Family History  Problem Relation Age of Onset   Diabetes Mother    AAA (abdominal aortic aneurysm) Mother    Coronary artery disease Mother    Diabetes Father    Coronary artery disease Father    Dementia Father    Breast cancer Sister     Social History Social History   Tobacco Use   Smoking status: Never   Smokeless tobacco: Never  Vaping Use   Vaping Use: Never used  Substance Use Topics   Alcohol use: No   Drug use: No    Review of Systems  Constitutional: No fever/chills Eyes: No visual changes. ENT: No sore throat. Cardiovascular: Denies chest pain. Respiratory: Denies shortness of breath. Gastrointestinal: No abdominal pain.  No nausea, no  vomiting.  No diarrhea.  No constipation. Genitourinary: Negative for dysuria. Musculoskeletal: Negative for back pain. Skin: Negative for rash. Neurological: Negative for headaches, focal weakness o  ____________________________________________   PHYSICAL EXAM:  VITAL SIGNS: ED Triage Vitals  Enc Vitals Group     BP 06/26/21 1239 (!) 153/69     Pulse Rate 06/26/21 1239 94     Resp 06/26/21 1239 17     Temp 06/26/21 1239 98.6 F (37 C)     Temp Source 06/26/21 1239 Oral     SpO2 06/26/21 1239 98 %     Weight 06/26/21 1231 170 lb (77.1 kg)     Height 06/26/21 1231 5\' 6"  (1.676 m)     Head Circumference --      Peak Flow --      Pain Score 06/26/21 1231 0     Pain Loc --      Pain Edu? --      Excl. in [place]? --     Constitutional: Alert and oriented. Well appearing and in no acute distress. Eyes: Conjunctivae are normal. PERRL. EOMI. Head: Atraumatic. Nose: No congestion/rhinnorhea. Mouth/Throat: Mucous membranes are moist.  Oropharynx non-erythematous. Neck: No stridor.  Cardiovascular: Normal rate, regular rhythm. Grossly normal heart sounds.   Good peripheral circulation. Respiratory: Normal respiratory effort.  No retractions. Lungs CTAB. Gastrointestinal: Soft and nontender. No distention. No abdominal bruits. No CVA tenderness.  Patient does have a big ostomy which is continuous with bowel when I insert my finger into it.  The discharge from the ostomy is Hemoccult positive and slightly orange in color. Musculoskeletal: No lower extremity tenderness nor edema.  No joint effusions. Neurologic:  Normal speech and language. No gross focal neurologic deficits are appreciated. No gait instability. Skin:  Skin is warm, dry and intact. No rash noted. Psychiatric: Mood and affect are normal. Speech and behavior are normal.  ____________________________________________   LABS (all labs ordered are listed, but only abnormal results are displayed)  Labs Reviewed  COMPREHENSIVE METABOLIC PANEL - Abnormal; Notable for the following components:      Result Value   Sodium 128 (*)    Chloride 97 (*)    CO2 21 (*)    Glucose, Bld 1,153 (*)    Calcium 7.2 (*)    Total Protein 6.4 (*)    Albumin 2.0 (*)    Alkaline Phosphatase 132 (*)    All other components within normal limits  CBC WITH DIFFERENTIAL/PLATELET - Abnormal; Notable for the following components:   RBC 3.10 (*)    Hemoglobin 8.4 (*)    HCT 28.6 (*)    MCHC 29.4 (*)    RDW 19.0 (*)    All other components within normal limits  COMPREHENSIVE METABOLIC PANEL - Abnormal; Notable for the following components:   Sodium 131 (*)    Potassium 3.1 (*)    Glucose, Bld 199 (*)    Calcium 7.8 (*)    Albumin 2.4 (*)    Alkaline Phosphatase 168 (*)    Anion gap 4 (*)    All other components within normal limits  CBG MONITORING, ED - Abnormal; Notable for the following components:   Glucose-Capillary 194 (*)    All other components within normal limits  CBG MONITORING, ED - Abnormal; Notable for the following components:   Glucose-Capillary 188 (*)    All other components within  normal limits  RESP PANEL BY RT-PCR (FLU A&B, COVID) ARPGX2  LACTIC ACID, PLASMA  LACTIC ACID,  PLASMA  HEMOGLOBIN F6B  BASIC METABOLIC PANEL  CBC  TROPONIN I (HIGH SENSITIVITY)  TROPONIN I (HIGH SENSITIVITY)   ____________________________________________  EKG   ____________________________________________  RADIOLOGY Gertha Calkin, personally viewed and evaluated these images (plain radiographs) as part of my medical decision making, as well as reviewing the written report by the radiologist.  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  Patient also has multiple abscesses in his belly.  Review of his old CT reports show that there were abscesses in the belly previously that apparently were thought to have been draining.  Official radiology report(s): CT ABDOMEN PELVIS W CONTRAST  Result Date: 06/26/2021 CLINICAL DATA:  Ileostomy bag leakage EXAM: CT ABDOMEN AND PELVIS WITH CONTRAST TECHNIQUE: Multidetector CT imaging of the abdomen and pelvis was performed using the standard protocol following bolus administration of intravenous contrast. CONTRAST:  33mL OMNIPAQUE IOHEXOL 350 MG/ML SOLN COMPARISON:  CT 03/13/2021, 11/28/2020 FINDINGS: Lower chest: Lung bases demonstrate small bilateral pleural effusions. Atelectasis or minimal pneumonia at the bases. 16 x 14 mm right lower lobe pulmonary nodule, previously 14 x 13 mm. Irregular lingular pulmonary nodule measuring 13 x 10 mm, previously 16 x 12 mm. Normal cardiac size. Hepatobiliary: Status post cholecystectomy. No focal hepatic abnormality. No intra or extrahepatic biliary dilatation Pancreas: Unremarkable. No pancreatic ductal dilatation or surrounding inflammatory changes. Spleen: Normal in size without focal abnormality. Adrenals/Urinary Tract: Adrenal glands are normal. Kidneys show no hydronephrosis. The bladder is normal Stomach/Bowel: The  stomach is nonenlarged. Status post distal left colectomy with reanastomosis. Collapsed appearing colon. Contrast opacifies nondilated small bowel. Interval postsurgical changes of the small bowel. Large ventral wound containing contrast which appears to communicate with thickened small bowel deep to the wound, series 2, image 61. Vascular/Lymphatic: Moderate aortic atherosclerosis. No aneurysm. No suspicious nodes. Reproductive: Prostate calcifications without mass Other: No free air. Multiple mildly rim enhancing intra-abdominal fluid collections. 5 x 2.6 cm right mid abdominal collection, series 2, image 44. 2.4 x 1.8 cm fluid collection in the right mid lateral abdomen adjacent to small bowel, series 2, image 47 5.2 x 1.3 cm lenticular shaped collection within the left mid abdomen, coronal series 6, image 32. Multiple additional smaller rim enhancing collections within the abdomen concerning for abscess. Small rim enhancing perihepatic and perisplenic fluid collections. generalized soft tissue stranding within the mesentery which is new compared to prior. Musculoskeletal: No acute or significant osseous findings. IMPRESSION: 1. Large ventral wound containing small amount of contrast. Postsurgical changes of small bowel deep to the wound with enteral contrast from the small bowel extending into the large ventral wound suggesting enterocutaneous fistula. 2. Multiple intra-abdominal rim enhancing fluid collections as described above concerning for intra-abdominal abscess. Generalized soft tissue stranding throughout the mesentery, likely inflammatory. 3. Small bilateral pleural effusions with atelectasis or pneumonia at the bases. Metastatic lung nodules, slight increased size of right lower lobe metastatic nodule. Electronically Signed   By: Donavan Foil M.D.   On: 06/26/2021 19:03    ____________________________________________   PROCEDURES  Procedure(s) performed (including Critical Care): Critical care  time 45 minutes.  This includes reviewing the old x-ray reports the patient's family have brought.  It also involves talking to the patient repeatedly about his problems and patient's family member.  We also attempted to obtain old records from the hospital in [facility] they have not come yet.  I discussed his case with  the surgeon twice..  I also discussed his case with the hospitalist.  Procedures   ____________________________________________   INITIAL IMPRESSION / ASSESSMENT AND PLAN / ED COURSE  Patient with Hemoccult positive drainage from his ileostomy.  The ileostomy bag is nonadherent and there are no more bags at home.  Additionally patient has multiple abscesses in his abdomen apparently as a complication of his previous surgeries.  He is not having a white count or pain or any other problems currently.  We will get him in the hospital and I understand that Dr. Celine Ahr was to try to take down his ileostomy so the patient can get on with physical therapy etc.  The abscesses we will try to treat with IV antibiotics and her IR drainage.  Some of them might be accessible when the ileostomy was taken down if we can indeed do it as I understand has been planned.              ____________________________________________   FINAL CLINICAL IMPRESSION(S) / ED DIAGNOSES  Final diagnoses:  Abscess of intestine  Hematochezia  Ileostomy dysfunction (HCC)   Actual diagnosis is are multiple abscesses intra-abdominal he Hemoccult positive drainage from ileostomy And ileostomy malfunction  ED Discharge Orders     None        Note:  This document was prepared using Dragon voice recognition software and may include unintentional dictation errors.    Nena Polio, MD 06/26/21 2351

## 2021-06-26 NOTE — Consult Note (Addendum)
PHARMACY - TOTAL PARENTERAL NUTRITION CONSULT NOTE   Indication: Fistula  Patient Measurements: Height: 5\' 6"  (167.6 cm) Weight: 77.1 kg (170 lb) IBW/kg (Calculated) : 63.8 TPN AdjBW (KG): 77.1 Body mass index is 27.44 kg/m.  Assessment:  Patient is an 80 y/o M with medical history including diabetes, HTN, colon cancer with lung metastases, recent history of admission at OSH for cholecystitis s/p cholecystectomy c/b post-op sepsis. Patient underwent ExLap with findings of small bowel injury which was repaired. 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 He is now presenting to the hospital for ileostomy leakage. Imaging notable for multiple intra-abdominal abscess with contrast extravasation to midline of abdominal wound. General surgery is following. Pharmacy consulted to continue home TPN while patient is admitted.  Glucose / Insulin: History of controlled diabetes (last Hgb A1c 6.9%) Electrolytes: Hyponatremia, hypokalemia Renal: Scr < 1 Hepatic: LFTs within normal limits Intake / Output; MIVF:  GI Imaging: 8/17 CT abdomen / pelvis: Large ventral wound containing small amount of contrast. Enterocutaneous fistula. Multiple intra-abdominal abscess(s). Mesenteric inflammation. Small bilateral pleural effusions with atelectasis or pneumonia at the bases. Metastatic lung nodules, slight increased size of right lower lobe metastatic nodule GI Surgeries / Procedures:  History of recent cholecystectomy, ExLap at OSH in 05/2021  Central access: POA TPN start date: 8/18  Outpatient TPN information: [facility] in [place], [place] Ph: [phone]  Nutritional Goals: Pending  RD Assessment: Pending  Current Nutrition:  NPO and TPN  Plan:  --Home TPN continued on admission --Will follow-up with outpatient TPN provider 8/18 AM to acquire TPN prescription  --Follow-up RD recommendations  Miguel Flores 06/26/2021,11:34 PM

## 2021-06-27 ENCOUNTER — Inpatient Hospital Stay: Payer: Self-pay

## 2021-06-27 DIAGNOSIS — E440 Moderate protein-calorie malnutrition: Secondary | ICD-10-CM | POA: Diagnosis present

## 2021-06-27 DIAGNOSIS — K9413 Enterostomy malfunction: Secondary | ICD-10-CM | POA: Diagnosis not present

## 2021-06-27 DIAGNOSIS — K632 Fistula of intestine: Secondary | ICD-10-CM | POA: Diagnosis not present

## 2021-06-27 LAB — BASIC METABOLIC PANEL
Anion gap: 6 (ref 5–15)
BUN: 19 mg/dL (ref 8–23)
CO2: 25 mmol/L (ref 22–32)
Calcium: 8 mg/dL — ABNORMAL LOW (ref 8.9–10.3)
Chloride: 103 mmol/L (ref 98–111)
Creatinine, Ser: 0.65 mg/dL (ref 0.61–1.24)
GFR, Estimated: >60 mL/min (ref >60)
Glucose, Bld: 197 mg/dL — ABNORMAL HIGH (ref 70–99)
Potassium: 3 mmol/L — ABNORMAL LOW (ref 3.5–5.1)
Sodium: 134 mmol/L — ABNORMAL LOW (ref 135–145)

## 2021-06-27 LAB — GLUCOSE, CAPILLARY
Glucose-Capillary: 205 mg/dL — ABNORMAL HIGH (ref 70–99)
Glucose-Capillary: 184 mg/dL — ABNORMAL HIGH (ref 70–99)
Glucose-Capillary: 166 mg/dL — ABNORMAL HIGH (ref 70–99)
Glucose-Capillary: 199 mg/dL — ABNORMAL HIGH (ref 70–99)
Glucose-Capillary: 218 mg/dL — ABNORMAL HIGH (ref 70–99)

## 2021-06-27 LAB — CBC
HCT: 28 % — ABNORMAL LOW (ref 39.0–52.0)
Hemoglobin: 8.8 g/dL — ABNORMAL LOW (ref 13.0–17.0)
MCH: 27.2 pg (ref 26.0–34.0)
MCHC: 31.4 g/dL (ref 30.0–36.0)
MCV: 86.4 fL (ref 80.0–100.0)
Platelets: 298 10*3/uL (ref 150–400)
RBC: 3.24 MIL/uL — ABNORMAL LOW (ref 4.22–5.81)
RDW: 18.3 % — ABNORMAL HIGH (ref 11.5–15.5)
WBC: 7.5 10*3/uL (ref 4.0–10.5)
nRBC: 0 % (ref 0.0–0.2)

## 2021-06-27 LAB — MAGNESIUM: Magnesium: 1.5 mg/dL — ABNORMAL LOW (ref 1.7–2.4)

## 2021-06-27 LAB — HEMOGLOBIN A1C
Hgb A1c MFr Bld: 6.4 % — ABNORMAL HIGH (ref 4.8–5.6)
Mean Plasma Glucose: 136.98 mg/dL

## 2021-06-27 LAB — PHOSPHORUS: Phosphorus: 3.3 mg/dL (ref 2.5–4.6)

## 2021-06-27 LAB — TRIGLYCERIDES: Triglycerides: 83 mg/dL (ref <150)

## 2021-06-27 MED ORDER — GUAIFENESIN-DM 100-10 MG/5ML PO SYRP
5.0000 mL | ORAL_SOLUTION | ORAL | Status: DC | PRN
  Administered 2021-06-27 – 2021-06-30 (×10): 5 mL via ORAL
  Filled 2021-06-27 (×10): qty 5

## 2021-06-27 MED ORDER — ASPIRIN EC 81 MG PO TBEC
81.0000 mg | DELAYED_RELEASE_TABLET | Freq: Every day | ORAL | Status: DC
  Administered 2021-06-27 – 2021-07-25 (×28): 81 mg via ORAL
  Filled 2021-06-27 (×29): qty 1

## 2021-06-27 MED ORDER — CHLORHEXIDINE GLUCONATE CLOTH 2 % EX PADS
6.0000 | MEDICATED_PAD | Freq: Every day | CUTANEOUS | Status: DC
  Administered 2021-06-27 – 2021-07-24 (×26): 6 via TOPICAL

## 2021-06-27 MED ORDER — POTASSIUM CHLORIDE 10 MEQ/100ML IV SOLN
10.0000 meq | INTRAVENOUS | Status: AC
Start: 2021-06-27 — End: 2021-06-27
  Administered 2021-06-27 (×4): 10 meq via INTRAVENOUS
  Filled 2021-06-27 (×4): qty 100

## 2021-06-27 MED ORDER — TRAVASOL 10 % IV SOLN
INTRAVENOUS | Status: AC
  Filled 2021-06-27: qty 1055.8

## 2021-06-27 MED ORDER — MAGNESIUM SULFATE 2 GM/50ML IV SOLN
2.0000 g | Freq: Once | INTRAVENOUS | Status: AC
  Administered 2021-06-27: 2 g via INTRAVENOUS
  Filled 2021-06-27: qty 50

## 2021-06-27 NOTE — ED Notes (Signed)
Pharmacy contacted about patients home TPN.

## 2021-06-27 NOTE — ED Notes (Signed)
Pt assisted with the urinal - pericare and ostomy care performed.

## 2021-06-27 NOTE — Consult Note (Signed)
Consult cancelled  Dr Jonathon Bellows MD,MRCP [facility]) Gastroenterology/Hepatology Pager: [phone]

## 2021-06-27 NOTE — Consult Note (Signed)
PHARMACY - TOTAL PARENTERAL NUTRITION CONSULT NOTE   Indication: Fistula  Patient Measurements: Height: 5' 5.98" (167.6 cm) Weight: 74.4 kg (164 lb) IBW/kg (Calculated) : 63.76 TPN AdjBW (KG): 74.4 Body mass index is 26.48 kg/m.  Assessment:  Patient is an 80 y/o M with medical history including diabetes, HTN, colon cancer with lung metastases, recent history of admission at OSH for cholecystitis s/p cholecystectomy c/b post-op sepsis. Patient underwent ExLap with findings of small bowel injury which was repaired. 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 He is now presenting to the hospital for ileostomy leakage. Imaging notable for multiple intra-abdominal abscess with contrast extravasation to midline of abdominal wound. General surgery is following. Pharmacy consulted to continue home TPN while patient is admitted.  Glucose / Insulin: History of controlled diabetes (last Hgb A1c 6.9%) Electrolytes: Hyponatremia, hypokalemia, hypomagnesemia Renal: Scr < 1 Hepatic: LFTs within normal limits Intake / Output; MIVF:  GI Imaging: 8/17 CT abdomen / pelvis: Large ventral wound containing small amount of contrast. Enterocutaneous fistula. Multiple intra-abdominal abscess(s). Mesenteric inflammation. Small bilateral pleural effusions with atelectasis or pneumonia at the bases. Metastatic lung nodules, slight increased size of right lower lobe metastatic nodule GI Surgeries / Procedures:  History of recent cholecystectomy, ExLap at OSH in 05/2021  Central access: POA TPN start date: 8/18   Outpatient TPN information: [facility] in [place], [place] Ph: [phone]  Nutritional Goals: Goal TPN rate is 83 mL/hr (provides 105.6 g of protein and 2262 kcals per day)  RD Assessment: Estimated Needs Total Energy Estimated Needs: 2000-2300kcal/day Total Protein Estimated Needs: 100-115g/day Total Fluid Estimated  Needs: 2.0-2.3L/day  Current Nutrition:  TPN  Plan:  Start in-house TPN formulation at 83 mL/hr beginning at 1800 (2092 mL w/ overfill) Nutritional components: Amino acids (Travasol 10%): 105.6 grams Lipids (20% SMOF): 68.8 grams Dextrose: 338.6 grams Electrolytes in TPN (standard): Na 62mEq/L, K 72mEq/L, Ca 19mEq/L, Mg 39mEq/L, and Phos 49mmol/L. Cl:Ac 1:1 Add standard MVI and trace elements to TPN continue Moderate q4h SSI and adjust as needed  Electrolyte correction 2 grams IV magnesium sulfate x 1 10 mEq IV Kcl x 4 per Dr Loleta Books Monitor TPN labs on Mon/Thurs, daily until stable  Dallie Piles 06/27/2021,7:12 AM

## 2021-06-27 NOTE — Clinical Social Work Note (Addendum)
CSW acknowledges SNF consult. Patient admitted from home with TPN. It is often very difficult to find facility to accept patient on TPN. Either SNF's do not take patients with TPN or they can only have one TPN patient in the building at a time. Patient active with [facility] for nursing. [place] representative will find out which infusion company he is using. Please consult PT and OT if it is believed patient needs SNF placement.  Dayton Scrape, CSW [phone]  10:26 am: Infusion agency is [facility]. Left voicemail for their representative.  Dayton Scrape, [place]

## 2021-06-27 NOTE — Consult Note (Signed)
[place] Nurse wound follow up I was notified by unit staff that the Eakin pouching system was leaking.  I learned that the pouching system had been working fine until the patient got up to walk with PT.  Upon return to bed and wall suction, the pouch began leaking.  I removed the Eakin pouch, cleansed the skin with water, crusted with powder and skin barrier film. I placed a total of 3 barrier rings around the wound, then applied a 2 piece 2 and 3/4 inch pouching system.  The pattern to cut out the skin barrier and extra supplies are at the bedside.    I did not see an immediate reason the Eakin system failed.  I am assuming it was related to removal of the suction from the system while walking.  I explained to the patient and primary RN, Manuela Schwartz, that [facility] time the pouching system leaks it must be changed immediately.  And, I have entered orders for the new approach.  I plan to follow up in the morning. Val Riles, RN, MSN, CWOCN, CNS-BC, pager [phone]

## 2021-06-27 NOTE — Progress Notes (Signed)
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  He did had evidence of metastatic lung disease but is was able to be controlled with chemotherapy.  He presented to [facility] 6 weeks ago for acute cholecystitis and underwent laparoscopic cholecystectomy on May 16, 2021.  His hospitalization was complicated by missed bowel injury requiring exploratory laparotomy and multiple interventions.  Eventually the patient developed enterocutaneous fistula and was in the hospital for over 4 weeks.  Now he presents with issues regarding enterocutaneous fistula and the Eakin pouch leaking. I personally reviewed the CT scan and there is evidence of a small collections that given his primary pathology I will not attempt any percutaneous drainage.  This may lead to more complications given his EC fistula and friable bowel.  I discussed with his son in detail that this is a very complex issue.  He is still at the very early stages where wound care and TPN will take priority.  Given our limited resources I do think that he has better care in a tertiary facility.  I will be happy to be delays on help coordinate his wound care and nutritional needs but he is definitive surgery will have to be done at a tertiary facility where interventional radiology, advanced endoscopy and advance nutritional teams as well as wound care teams are available. Definitive surgery will need to wait at a minimum 6 months to allow a procedure to be safely performed.  I have also discussed with hospitalist Dr. Loleta Books about my thought process.  He may benefit from LTAC due to his nutritional needs requiring TPN as well as a complex enterocutaneous fistula requiring wound manager (Eakin pouch).  At this point there is no acute surgical needs.

## 2021-06-27 NOTE — Progress Notes (Signed)
Initial Nutrition Assessment  DOCUMENTATION CODES:   Non-severe (moderate) malnutrition in context of chronic illness  INTERVENTION:   TPN per pharmacy   Recommend check B12   Recommend SLP evaluation   Daily weights   NUTRITION DIAGNOSIS:   Moderate Malnutrition related to cancer and cancer related treatments as evidenced by moderate fat depletion, moderate muscle depletion, severe muscle depletion.  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  Met with pt and pt's son in room today. Pt is a poor historian so majority of history was obtained from pt's son. Per son report, pt underwent cholecystectomy 7/7. A few days later, pt became septic and underwent exploratory laparotomy and was found to have small bowel injury that was repaired and pt was left with open abdomen and the following day had reopening of the laparotomy without any new findings and the abdominal wall was closed. A couple of weeks later, pt was diagnosed with enterocutaneous fistula and was eventually discharged with Eakin Pouch and on TPN. There are no records available in chart from pt's surgery so unsure if any bowel was resected during perforation repair. Per surgery MD, EC fistula involves the jejunum. Son reports that patient has been receiving TPN at home via PICC line ([facility] in [place], [place] Ph: [phone]). Pt receiving lipids only two days a  week. Pt has also been eating a mechanical soft diet at home. Son reports that at baseline, pt has good appetite and oral intake but reports that he has only been eating about 50% of what he usually eats at home. Per chart, pt is down 18lbs(10%) over the past 6-7 months and is down 11lbs(6%) since June. Pt drinking coffee during RD visit today. RD noticed pt coughing after drinking coffee. Pt reports that he has a chronic dry cough. Son denies any issues with swallowing at home but does report that pt was on a pureed diet in the hospital that he reports was in relation to his bowel surgery and not his swallowing. Pt eats a mechanical soft diet at home. Will get an SLP evaluation to rule out aspiration. Spoke with pt and son about pt's nutritional status, TPN goals, weight goals and nutrient concerns. Pt will need to have his B12 monitored as food is not reaching his ileum and TPN only provides 1mg/day. Son reports that pt became volume overloaded after initially starting TPN in hospital; will monitor daily weights.   Spoke with Dr. PDahlia Byesand Dr. DLoleta Booksabout the possibility of G-tube placement versus TPN as it will be hard to find placement for him on TPN. MD with concerns about nutrient absorption as well as fistula management with enteral feeds. MD wishes to continue TPN and pleasure feeds at this time.   Medications reviewed and include: insulin, protonix, Mg sulfate, KCl  Labs reviewed: Na 134(L), K 3.0(L), P 3.3 wnl, Mg 1.5(L) Triglycerides 83 Hgb 8.8(L), Hct 28.0(L) Cbgs- 205, 184 x 24 hrs AIC 6.4(H)- 8/17  NUTRITION - FOCUSED  PHYSICAL EXAM:  Flowsheet Row Most Recent Value  Orbital Region Moderate depletion  Upper Arm Region Moderate depletion  Thoracic and Lumbar Region Moderate depletion  Buccal Region Mild depletion  Temple Region Moderate depletion  Clavicle Bone Region Severe depletion  Clavicle and Acromion Bone Region Severe depletion  Scapular Bone Region Severe depletion   Dorsal Hand Moderate depletion  Patellar Region Moderate depletion  Anterior Thigh Region Moderate depletion  Posterior Calf Region Moderate depletion  Edema (RD Assessment) None  Hair Reviewed  Eyes Reviewed  Mouth Reviewed  Skin Reviewed  Nails Reviewed   Diet Order:    Diet Order             DIET DYS 3 Room service appropriate? Yes; Fluid consistency: Thin  Diet effective now                  EDUCATION NEEDS:   Education needs have been addressed  Skin:  Skin Assessment: Reviewed RN Assessment (midabdominal wound with EC fistula 3.5 x 2.5 x 1 cm)  Last BM:  8/18- via ostomy  Height:   Ht Readings from Last 1 Encounters:  06/27/21 5' 5.98" (1.676 m)    Weight:   Wt Readings from Last 1 Encounters:  06/27/21 74.4 kg    Ideal Body Weight:  64.5 kg  BMI:  Body mass index is 26.48 kg/m.  Estimated Nutritional Needs:   Kcal:  2000-2300kcal/day  Protein:  100-115g/day  Fluid:  2.0-2.3L/day  Koleen Distance MS, RD, LDN Please refer to [facility] for RD and/or RD on-call/weekend/after hours pager

## 2021-06-27 NOTE — Progress Notes (Signed)
Pt has TPN running from home. Pharmacy aware and will continue to run for now per pharmacy.

## 2021-06-27 NOTE — Consult Note (Signed)
WOC Nurse Consult Note: Patient receiving care in [facility]. Reason for Consult: EC fistula Wound type: midabdominal wound with EC fistula. Original surgery performed about 4 weeks ago at [facility] Pressure Injury POA: Yes/No/NA Measurement: 3.5 x 2.5 x 1 cm Wound bed: beefy red Drainage (amount, consistency, odor) thin brown effluent from the EC fistula at the inferior margin of the wound, and yellow pus from the superior aspect of the wound. Periwound: Significantly impacted by small bowel effluent leaking onto surround skin and undermining the Hollister wound managers the patient had been using at home.  The Contact Dermatitis from the effluent extended to suprapubic and right lateral flank area. Dressing procedure/placement/frequency:  Wound manager removed. Periwound skin thoroughly cleansed with water. Two layers of crusting with powder and skin barrier wipes applied. Medium Eakin Pouch Kellie Simmering [phone]) prepared with a 1.5 inch round opening cut into the barrier. Two barrier rings opened into a "U" shape and placed concentrically along the inferior wound border. Eakin placed over the prepared area. A Red Robinson catheter 36 Fr Kellie Simmering #41) inserted and positioned into the pouch along the inferior border, but well away from the actual wound bed. Suction applied via the wall suction unit.  Good seal obtained. Effluent observed going into the wall cannister.  Patient and his son, and the primary RN Manuela Schwartz, were all updated on what was done, how it was done, and why the actions were taken.  That is, to contain the drainage, and provide an opportunity for the impaired skin to heal.  The suction can be discontinued long enough for the patient to ambulate.  The suction should remain in place when getting the patient up to a chair.  These pouches can remain in place up to 10 days as long as there is no leakage, and no undermining of the skin barrier portion of the pouch.   Val Riles,  RN, MSN, CWOCN, CNS-BC, pager [phone]

## 2021-06-27 NOTE — Plan of Care (Signed)
  Problem: Clinical Measurements: Goal: Ability to maintain clinical measurements within normal limits will improve Outcome: Progressing Goal: Will remain free from infection Outcome: Progressing Goal: Diagnostic test results will improve Outcome: Progressing Goal: Respiratory complications will improve Outcome: Progressing Goal: Cardiovascular complication will be avoided Outcome: Progressing   Pt is involved in and agrees with the plan of care. V/S stable. Denies any pain. Has leaking ileostomy bag. Unable to change the bag at this time as pt concerned that it's too small. Will wait for wound care nurse this am.

## 2021-06-27 NOTE — ED Notes (Addendum)
Ostomy care performed; barrier cream applied. Pt repositioned - Dr. Damita Dunnings @ the bedside.

## 2021-06-27 NOTE — Progress Notes (Signed)
[facility] PROGRESS NOTE    Miguel Flores  WUJ:[id number] DOB: 06/02/1941 DOA: 06/26/2021 PCP: [name], MD      Brief Narrative:  Miguel Flores is a 80 y.o. 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 [name] who presented with draining enterocutaneous fistula.  Patient has a complicated recent abdominal surgery history (see Dr. Deniece Ree note from 8/17) that began 6 weeks ago with cholecystitis at a hospital in [name].  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  Attempts were made to discharge to LTAC, but this was denied by insurance and he was discharged home.  Family called the PCP stating that they did not have colostomy supplies, and PCP recommended the patient go to the hospital by EMS for "evaluation".  Given the patient has no new symptoms, presumably this was intended to be evaluation by social work/case management.  In the ER, CT showed small bowel with enteral contrast extending in to the large ventral wound, suggesting entero-cutaneous fistula, as well as multiple intra-abdominal rim enhancing fluid collections, some generalized soft tissue stranding and small bilateral pleural effusions and known metastatic lung nodules, slightly increased in size.  He was afebrile, HR and BP normal, without constitutional symptoms or abdominal pain.  Normal WBC.            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  - Consult Dr. Dahlia Byes, Surgery - Continue TPN - Check B12  - Hold antibiotics unless patient develops leukocytosis, fever, pain or constitutional symptoms that compel  treatment.   - Dr. Dahlia Byes recommends against percutaneous drainage of the current fluid collections unless compelled to, as this creates high risk for further bowel injury  - Dr. Dahlia Byes will work towards outpatient referral to tertiary surgical center  - Consult WOC team - Consult TOC     Anemia of cancer and chemotherapy, more recently in context of surgery Baseline Hgb 9-10 g/dL.  More recently, has been in the 8s per son, as a result of his recent severe illness, repeat surgeries and TPN.  Stable rleative to that recent baseline and no report of melena, just some "spots" of blood in the fistula output, which is epxected, and I do not believe at all clinically consistent with an ulcer. - Stop PPI - Cancel GI consult    Hypokalemia Hypomagnesemia - Supplemetn mag and K  Hyponatremia Mild, asymptoamtic  Diabetes with polyneuropathy Glucoses high normal, A1c 6.4% - Continue Lyrica - Contineu aspirin, pravastatin -Hold metformin, Trulicity, sulfonylurea - SS correction insulin ordered  Hypertnesion BP controlled - Continue losartan  Sigmoid colon cancer, metastatic to lungs FYI message sent to Dr. [name]  Moderate protein calorie malnutrition As evidenced by reduced subcutaneous muscle mass, fistula and inability to take adequate calories and absorb them by mouth.    Disposition: Status is: Inpatient  Remains inpatient appropriate because: Failed outpatient therapy  Dispo: The patient is from: Home              Anticipated d/c is to: SNF or LTACH              Patient currently is medically stable to d/c.  Difficult to place patient Yes      He has an enterocutaneous fistula which requires frequent skilled nursing care, as well as requires total parenteral nutrition; family attempted to manage this at home, but failed.     Level of care: Med-Surg       MDM: The below labs and imaging reports were reviewed and summarized above.  Medication management as  above.    DVT prophylaxis: SCDs Start: 06/26/21 2251  Code Status: Full code Family Communication: Son at the bedside    Consultants:  Dr. Adora Fridge  Procedures:  8/17 CT abdomen and pelvis, enterocutaneous fistula and several rim-enhancing fluid collections in the abdomen  Antimicrobials:  None  Culture data:             Subjective: Patient feels well.  No headache, chest pain, dyspnea, abdominal pain, vomiting, melena or bright red blood from his fistula.  Objective: Vitals:   06/27/21 0100 06/27/21 0223 06/27/21 0401 06/27/21 1101  BP: 136/61 (!) 147/65 (!) 146/65 (!) 137/56  Pulse: 88 92 91 92  Resp: 18 20 18 18   Temp: 98.1 F (36.7 C) 98.2 F (36.8 C) 97.8 F (36.6 C) 98.1 F (36.7 C)  TempSrc: Oral Oral Oral Oral  SpO2: 98% 98% 97% 98%  Weight:  74.4 kg    Height:  5' 5.98" (1.676 m)      Intake/Output Summary (Last 24 hours) at 06/27/2021 1221 Last data filed at 06/27/2021 0452 Gross per 24 hour  Intake --  Output 200 ml  Net -200 ml   Filed Weights   06/26/21 1231 06/27/21 0223  Weight: 77.1 kg 74.4 kg    Examination: General appearance: Elderly adult male, alert and in no obvious distress.   HEENT: Anicteric, conjunctiva pink, lids and lashes normal. No nasal deformity, discharge, epistaxis.  Lips moist, dentition in good repair, oropharynx dry, no oral lesions.   Skin: Warm and dry.  Pale, no jaundice.  No suspicious rashes or lesions. Cardiac: RRR, nl S1-S2, no murmurs appreciated.  No LE edema.  Radial  pulses 2+ and symmetric. Respiratory: Normal respiratory rate and rhythm.  CTAB without rales or wheezes. Abdomen: Abdomen soft.  No TTP or guarding.  There is a hole in the abdomen in the upper right quadrant, with some brownish discharge, no blood.  No rigidity or rebound.  No ascites, distension, hepatosplenomegaly.   MSK: No deformities or effusions.  Moderately reduced muscle mass and fat diffusely. Neuro: Awake and alert.  EOMI, moves  all extremities with generalized weakness. Speech fluent.    Psych: Sensorium intact and responding to questions, attention normal. Affect normal.  Judgment and insight appear normal.    Data Reviewed: I have personally reviewed following labs and imaging studies:  CBC: Recent Labs  Lab 06/26/21 1735 06/27/21 0513  WBC 8.1 7.5  NEUTROABS 5.5  --   HGB 8.4* 8.8*  HCT 28.6* 28.0*  MCV 92.3 86.4  PLT 260 222   Basic Metabolic Panel: Recent Labs  Lab 06/26/21 1735 06/26/21 1907 06/27/21 0513  NA 128* 131* 134*  K 4.3 3.1* 3.0*  CL 97* 102 103  CO2 21* 25 25  GLUCOSE 1,153* 199* 197*  BUN 18 19 19   CREATININE 0.79 0.65 0.65  CALCIUM 7.2* 7.8* 8.0*  MG  --   --  1.5*  PHOS  --   --  3.3   GFR: Estimated Creatinine Clearance: 65.4 mL/min (by C-G formula based on SCr of 0.65 mg/dL). Liver Function Tests:  Recent Labs  Lab 06/26/21 1735 06/26/21 1907  AST 24 26  ALT 18 20  ALKPHOS 132* 168*  BILITOT 0.7 0.6  PROT 6.4* 7.5  ALBUMIN 2.0* 2.4*   No results for input(s): LIPASE, AMYLASE in the last 168 hours. No results for input(s): AMMONIA in the last 168 hours. Coagulation Profile: No results for input(s): INR, PROTIME in the last 168 hours. Cardiac Enzymes: No results for input(s): CKTOTAL, CKMB, CKMBINDEX, TROPONINI in the last 168 hours. BNP (last 3 results) No results for input(s): PROBNP in the last 8760 hours. HbA1C: Recent Labs    06/26/21 1735  HGBA1C 6.4*   CBG: Recent Labs  Lab 06/26/21 1812 06/26/21 2308 06/27/21 0347 06/27/21 0829 06/27/21 1130  GLUCAP 194* 188* 205* 184* 166*   Lipid Profile: Recent Labs    06/27/21 0513  TRIG 83   Thyroid Function Tests: No results for input(s): TSH, T4TOTAL, FREET4, T3FREE, THYROIDAB in the last 72 hours. Anemia Panel: No results for input(s): VITAMINB12, FOLATE, FERRITIN, TIBC, IRON, RETICCTPCT in the last 72 hours. Urine analysis:    Component Value Date/Time   COLORURINE YELLOW (A)  02/05/2017 1752   APPEARANCEUR HAZY (A) 02/05/2017 1752   LABSPEC 1.027 02/05/2017 1752   PHURINE 5.0 02/05/2017 1752   GLUCOSEU 150 (A) 02/05/2017 1752   HGBUR NEGATIVE 02/05/2017 1752   BILIRUBINUR NEGATIVE 02/05/2017 1752   KETONESUR 5 (A) 02/05/2017 1752   PROTEINUR 30 (A) 02/05/2017 1752   NITRITE NEGATIVE 02/05/2017 1752   LEUKOCYTESUR NEGATIVE 02/05/2017 1752   Sepsis Labs: @LABRCNTIP (procalcitonin:4,lacticacidven:4)  ) Recent Results (from the past 240 hour(s))  Resp Panel by RT-PCR (Flu A&B, Covid) Nasopharyngeal Swab     Status: None   Collection Time: 06/26/21  7:31 PM   Specimen: Nasopharyngeal Swab; Nasopharyngeal(NP) swabs in vial transport medium  Result Value Ref Range Status   SARS Coronavirus 2 by RT PCR NEGATIVE NEGATIVE Final    Comment: (NOTE) SARS-CoV-2 target nucleic acids are NOT DETECTED.  The SARS-CoV-2 RNA is generally detectable in upper respiratory specimens during the acute phase of infection. The lowest concentration of SARS-CoV-2 viral copies this assay can detect is 138 copies/mL. A negative result does not preclude SARS-Cov-2 infection and should not be used as the sole basis for treatment or other patient management decisions. A negative result may occur with  improper specimen collection/handling, submission of specimen other than nasopharyngeal swab, presence of viral mutation(s) within the areas targeted by this assay, and inadequate number of viral copies(<138 copies/mL). A negative result must be combined with clinical observations, patient history, and epidemiological information. The expected result is Negative.  Fact Sheet for Patients:  EntrepreneurPulse.com.au  Fact Sheet for Healthcare Providers:  IncredibleEmployment.be  This test is no t yet approved or cleared by the [place] FDA and  has been authorized for detection and/or diagnosis of SARS-CoV-2 by FDA under an Emergency Use  Authorization (EUA). This EUA will remain  in effect (meaning this test can be used) for the duration of the COVID-19 declaration under Section 564(b)(1) of the Act, 21 U.S.C.section 360bbb-3(b)(1), unless the authorization is terminated  or revoked sooner.       Influenza A by PCR NEGATIVE NEGATIVE Final   Influenza B by PCR NEGATIVE NEGATIVE Final    Comment: (NOTE) The Xpert Xpress SARS-CoV-2/FLU/RSV plus assay is intended as an aid in the diagnosis of influenza from Nasopharyngeal swab specimens and should not be used as a sole basis for treatment. Nasal washings and aspirates are  unacceptable for Xpert Xpress SARS-CoV-2/FLU/RSV testing.  Fact Sheet for Patients: EntrepreneurPulse.com.au  Fact Sheet for Healthcare Providers: IncredibleEmployment.be  This test is not yet approved or cleared by the [place] FDA and has been authorized for detection and/or diagnosis of SARS-CoV-2 by FDA under an Emergency Use Authorization (EUA). This EUA will remain in effect (meaning this test can be used) for the duration of the COVID-19 declaration under Section 564(b)(1) of the Act, 21 U.S.C. section 360bbb-3(b)(1), unless the authorization is terminated or revoked.  Performed at [facility], [address] [place], [place] [address]          Radiology Studies: CT ABDOMEN PELVIS W CONTRAST  Result Date: 06/26/2021 CLINICAL DATA:  Ileostomy bag leakage EXAM: CT ABDOMEN AND PELVIS WITH CONTRAST TECHNIQUE: Multidetector CT imaging of the abdomen and pelvis was performed using the standard protocol following bolus administration of intravenous contrast. CONTRAST:  45mL OMNIPAQUE IOHEXOL 350 MG/ML SOLN COMPARISON:  CT 03/13/2021, 11/28/2020 FINDINGS: Lower chest: Lung bases demonstrate small bilateral pleural effusions. Atelectasis or minimal pneumonia at the bases. 16 x 14 mm right lower lobe pulmonary nodule, previously 14 x 13 mm.  Irregular lingular pulmonary nodule measuring 13 x 10 mm, previously 16 x 12 mm. Normal cardiac size. Hepatobiliary: Status post cholecystectomy. No focal hepatic abnormality. No intra or extrahepatic biliary dilatation Pancreas: Unremarkable. No pancreatic ductal dilatation or surrounding inflammatory changes. Spleen: Normal in size without focal abnormality. Adrenals/Urinary Tract: Adrenal glands are normal. Kidneys show no hydronephrosis. The bladder is normal Stomach/Bowel: The stomach is nonenlarged. Status post distal left colectomy with reanastomosis. Collapsed appearing colon. Contrast opacifies nondilated small bowel. Interval postsurgical changes of the small bowel. Large ventral wound containing contrast which appears to communicate with thickened small bowel deep to the wound, series 2, image 61. Vascular/Lymphatic: Moderate aortic atherosclerosis. No aneurysm. No suspicious nodes. Reproductive: Prostate calcifications without mass Other: No free air. Multiple mildly rim enhancing intra-abdominal fluid collections. 5 x 2.6 cm right mid abdominal collection, series 2, image 44. 2.4 x 1.8 cm fluid collection in the right mid lateral abdomen adjacent to small bowel, series 2, image 47 5.2 x 1.3 cm lenticular shaped collection within the left mid abdomen, coronal series 6, image 32. Multiple additional smaller rim enhancing collections within the abdomen concerning for abscess. Small rim enhancing perihepatic and perisplenic fluid collections. generalized soft tissue stranding within the mesentery which is new compared to prior. Musculoskeletal: No acute or significant osseous findings. IMPRESSION: 1. Large ventral wound containing small amount of contrast. Postsurgical changes of small bowel deep to the wound with enteral contrast from the small bowel extending into the large ventral wound suggesting enterocutaneous fistula. 2. Multiple intra-abdominal rim enhancing fluid collections as described above  concerning for intra-abdominal abscess. Generalized soft tissue stranding throughout the mesentery, likely inflammatory. 3. Small bilateral pleural effusions with atelectasis or pneumonia at the bases. Metastatic lung nodules, slight increased size of right lower lobe metastatic nodule. Electronically Signed   By: Donavan Foil M.D.   On: 06/26/2021 19:03   DG Outside Films Chest  Result Date: 06/27/2021 This examination belongs to an outside facility and is stored here for comparison purposes only.  Contact the originating outside institution for any associated report or interpretation.  DG Outside Films Chest  Result Date: 06/27/2021 This examination belongs to an outside facility and is stored here for comparison purposes only.  Contact the originating outside institution for any associated report or interpretation.  DG Outside Films Chest  Result  Date: 06/27/2021 This examination belongs to an outside facility and is stored here for comparison purposes only.  Contact the originating outside institution for any associated report or interpretation.       Scheduled Meds:  aspirin EC  81 mg Oral Daily   Chlorhexidine Gluconate Cloth  6 each Topical Daily   insulin aspart  0-15 Units Subcutaneous Q4H   loratadine  10 mg Oral Daily   losartan  25 mg Oral Daily   pravastatin  20 mg Oral Q supper   pregabalin  75 mg Oral BID   Continuous Infusions:  magnesium sulfate bolus IVPB     potassium chloride 10 mEq (06/27/21 1133)   TPN ADULT (ION)       LOS: 1 day    Time spent: 35 minutes    Edwin Dada, MD [facility] 06/27/2021, 12:21 PM     Please page though [place] or Epic secure chat:  For [employer], Adult nurse

## 2021-06-28 ENCOUNTER — Ambulatory Visit: Payer: Medicare Other | Admitting: Surgery

## 2021-06-28 DIAGNOSIS — Z789 Other specified health status: Secondary | ICD-10-CM

## 2021-06-28 DIAGNOSIS — K9413 Enterostomy malfunction: Secondary | ICD-10-CM | POA: Diagnosis not present

## 2021-06-28 DIAGNOSIS — E440 Moderate protein-calorie malnutrition: Secondary | ICD-10-CM

## 2021-06-28 DIAGNOSIS — I10 Essential (primary) hypertension: Secondary | ICD-10-CM | POA: Diagnosis not present

## 2021-06-28 DIAGNOSIS — E663 Overweight: Secondary | ICD-10-CM | POA: Diagnosis present

## 2021-06-28 DIAGNOSIS — K632 Fistula of intestine: Secondary | ICD-10-CM | POA: Diagnosis not present

## 2021-06-28 LAB — VITAMIN B12: Vitamin B-12: 532 pg/mL (ref 180–914)

## 2021-06-28 LAB — COMPREHENSIVE METABOLIC PANEL
ALT: 18 U/L (ref 0–44)
AST: 27 U/L (ref 15–41)
Albumin: 2.1 g/dL — ABNORMAL LOW (ref 3.5–5.0)
Alkaline Phosphatase: 157 U/L — ABNORMAL HIGH (ref 38–126)
Anion gap: 6 (ref 5–15)
BUN: 19 mg/dL (ref 8–23)
CO2: 23 mmol/L (ref 22–32)
Calcium: 8.2 mg/dL — ABNORMAL LOW (ref 8.9–10.3)
Chloride: 104 mmol/L (ref 98–111)
Creatinine, Ser: 0.63 mg/dL (ref 0.61–1.24)
GFR, Estimated: >60 mL/min (ref >60)
Glucose, Bld: 208 mg/dL — ABNORMAL HIGH (ref 70–99)
Potassium: 3.8 mmol/L (ref 3.5–5.1)
Sodium: 133 mmol/L — ABNORMAL LOW (ref 135–145)
Total Bilirubin: 0.5 mg/dL (ref 0.3–1.2)
Total Protein: 6.7 g/dL (ref 6.5–8.1)

## 2021-06-28 LAB — GLUCOSE, CAPILLARY
Glucose-Capillary: 174 mg/dL — ABNORMAL HIGH (ref 70–99)
Glucose-Capillary: 180 mg/dL — ABNORMAL HIGH (ref 70–99)
Glucose-Capillary: 181 mg/dL — ABNORMAL HIGH (ref 70–99)
Glucose-Capillary: 183 mg/dL — ABNORMAL HIGH (ref 70–99)
Glucose-Capillary: 216 mg/dL — ABNORMAL HIGH (ref 70–99)
Glucose-Capillary: 226 mg/dL — ABNORMAL HIGH (ref 70–99)
Glucose-Capillary: 226 mg/dL — ABNORMAL HIGH (ref 70–99)

## 2021-06-28 LAB — MAGNESIUM: Magnesium: 1.8 mg/dL (ref 1.7–2.4)

## 2021-06-28 LAB — PHOSPHORUS: Phosphorus: 2.9 mg/dL (ref 2.5–4.6)

## 2021-06-28 MED ORDER — TRAVASOL 10 % IV SOLN
INTRAVENOUS | Status: AC
  Filled 2021-06-28: qty 1055.8

## 2021-06-28 MED ORDER — LOPERAMIDE HCL 2 MG PO CAPS: 2.0000 mg | ORAL_CAPSULE | ORAL | Status: DC | PRN

## 2021-06-28 MED ORDER — LOPERAMIDE HCL 2 MG PO CAPS
2.0000 mg | ORAL_CAPSULE | Freq: Once | ORAL | Status: AC
  Administered 2021-06-28: 2 mg via ORAL
  Filled 2021-06-28: qty 1

## 2021-06-28 MED ORDER — MAGNESIUM SULFATE 2 GM/50ML IV SOLN
2.0000 g | Freq: Once | INTRAVENOUS | Status: AC
  Administered 2021-06-28: 2 g via INTRAVENOUS
  Filled 2021-06-28: qty 50

## 2021-06-28 MED ORDER — INSULIN GLARGINE-YFGN 100 UNIT/ML ~~LOC~~ SOLN
5.0000 [IU] | Freq: Once | SUBCUTANEOUS | Status: AC
  Administered 2021-06-28: 5 [IU] via SUBCUTANEOUS
  Filled 2021-06-28: qty 0.05

## 2021-06-28 NOTE — Consult Note (Signed)
[facility] Nurse wound follow up Patient receiving care in [facility]. Son and primary RN, Myrtie Soman, at bedside for pouch change due to leaking. Wound type: abdominal wound with EC fistula at the most inferior distal area.  The EC fistula effluent has been undermining the skin barrier and causing leakage for an extended time--even before hospitalization. As best I can tell from the notes, the pouching system I placed at 3 pm yesterday didn't leak until 0500 today.  If that is the case, that 14 hour period is what I consider success.  I had Kelly perform 90% of the pouch change process with my guidance and assistance.  The son observed.  I used 2 U shaped barrier rings along the inferior margin and one at the top. The skin barrier opening was cut to 2 inches to accommodate the wound.  The irritated skin was heavily crusted with powder and skin barrier film and allowed to dry.  The significantly irritated skin outside the skin barrier area is vastly improved from yesterday.  I then went to Material Management and spoke with Wille Glaser.  He and I evaluated the number of the following supplies on hand in their Material Management area: skin barrier Kellie Simmering #2; pouches Kellie Simmering [phone], and barrier rings Kellie Simmering [phone].  Wille Glaser will be asking a courier to obtain more of these items for the Material Management storage room, as it does not appear at this time there is sufficient quantity for this patient for the weekend.  I obtained an ostomy belt Kellie Simmering 621) and placed it on the patient this morning myself.  Hopefully this will assist with reducing leakage.  Supplies and pattern at the bedside.  Val Riles, RN, MSN, CWOCN, CNS-BC, pager [phone]

## 2021-06-28 NOTE — Progress Notes (Signed)
Pt pouching system leaking. Skin cleansed with water, powder applied and skin barrier film. Placed new pouching system.

## 2021-06-28 NOTE — Progress Notes (Signed)
PROGRESS NOTE  Miguel Flores QQV:[id number] DOB: [birth year] DOA: 06/26/2021 PCP: Miguel Body, MD  HPI/Recap of past 24 hours: Miguel Flores is a 80 y.o. 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  Patient has a complicated recent abdominal surgery history (see Miguel Flores note from 8/17) that began 6 weeks ago with cholecystitis at a hospital in [facility].  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   Attempts were made to discharge to LTAC, but this was denied by insurance and he was discharged home.  Family called the PCP stating that they did not have colostomy supplies, and PCP recommended the patient go to the hospital by EMS for "evaluation".  Given the patient has no new symptoms, presumably this was intended to be evaluation by social work/case management.  Patient presented to the emergency room on 8/17 and at that time, CT scan noted small bowel with enteral contrast extending to the large ventral wound suggesting enterocutaneous fistula as well as multiple intra-abdominal rim-enhancing fluid collections.  Patient admitted to the hospitalist service with consultation by general surgery.  After being seen by surgery, recommendation is to hold antibiotics unless patient develops fever or other signs of new infection.  The belief is that patient would not do well with surgical correction until he has improved from a wound and strength standpoint.  Case management has been working to find a skilled nursing or LTAC for patient to go.  Patient himself doing okay.  No pain.  Occasional gagging when eating, but overall, no nausea.  Assessment/Plan: Principal Problem:   Ileostomy dysfunction (HCC) Active Problems:    Essential hypertension   Type 2 diabetes mellitus without complication, without long-term current use of insulin (HCC)   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HCC)   Lung metastases (HCC)   Post cholecystectomy intraabdominal abscess   S/P cholecystectomy   On total parenteral nutrition (TPN)   Malnutrition of moderate degree   Overweight (BMI 25.0-29.9) Enterocutaneous fistula Stable for now.  It is my medical opinion that they patient cannot obtain adequate, fluid/nutrition via oral intake until the fistula is repaired.  Continue TPN.  No reason for antibiotics unless patient develops leukocytosis, fever, pain or constitutional symptoms that compel treatment.  Miguel Flores, general surgery, recommends against percutaneous drainage of the current fluid collections unless compelled to, as this creates high risk for further bowel injury.  Patient would benefit from a long-term acute care facility or skilled nursing but is willing to take his TPN  Anemia of cancer and chemotherapy, more recently in context of surgery Baseline Hgb 9-10 g/dL, and more recently as per son, around 8 due to more severe illness, TPN and recent surgeries.  No heavy bleeding.  Holding PPI. - Cancel GI consult   Hypokalemia/Hypomagnesemia Replacing as needed   Hyponatremia Minimal, asymptomatic   Diabetes with polyneuropathy CBGs have remained stable.  On Lyrica plus sliding scale.  Trulicity, metformin and sulfonylureas held   Hypertension BP stable, on losartan   Sigmoid colon cancer, metastatic to lungs Oncology notified.   Moderate protein calorie malnutrition Seen by nutrition. Nutrition Status: Nutrition Problem: Moderate Malnutrition Etiology: cancer and cancer related treatments Signs/Symptoms: moderate fat depletion, moderate muscle depletion, severe muscle depletion   Overweight: Patient meets criteria  for BMI greater than 25  Code Status: Full code  Family Communication: Son at the bedside  Disposition Plan:  Looking for skilled nursing versus LTAC   Consultants: General surgery  Procedures: None  Antimicrobials: None  DVT prophylaxis: SCDs  Level of care: Med-Surg   Objective: Vitals:   06/28/21 0415 06/28/21 0750  BP: (!) 141/68 (!) 144/67  Pulse: 88 91  Resp: 20 20  Temp: (!) 97.5 F (36.4 C) 98 F (36.7 C)  SpO2: 97% 97%    Intake/Output Summary (Last 24 hours) at 06/28/2021 1435 Last data filed at 06/28/2021 1414 Gross per 24 hour  Intake 1305.24 ml  Output 600 ml  Net 705.24 ml   Filed Weights   06/26/21 1231 06/27/21 0223  Weight: 77.1 kg 74.4 kg   Flores mass index is 26.48 kg/m.  Exam:  General: Alert and oriented x3, no acute distress HEENT: Normocephalic and atraumatic, mucous membranes are moist Cardiovascular: Regular rate and rhythm, S1-S2 Respiratory: Clear to auscultation bilaterally Abdomen: Soft, noted a hole in the upper right quadrant with minimal discharge.  No surrounding erythema or tenderness Musculoskeletal: No clubbing, cyanosis, trace pitting edema Skin: No skin breaks, tears or lesions other than described above Psychiatry: Appropriate, no evidence of psychoses Neurology: No focal deficits   Data Reviewed: CBC: Recent Labs  Lab 06/26/21 1735 06/27/21 0513  WBC 8.1 7.5  NEUTROABS 5.5  --   HGB 8.4* 8.8*  HCT 28.6* 28.0*  MCV 92.3 86.4  PLT 260 370   Basic Metabolic Panel: Recent Labs  Lab 06/26/21 1735 06/26/21 1907 06/27/21 0513 06/28/21 0445  NA 128* 131* 134* 133*  K 4.3 3.1* 3.0* 3.8  CL 97* 102 103 104  CO2 21* 25 25 23   GLUCOSE 1,153* 199* 197* 208*  BUN 18 19 19 19   CREATININE 0.79 0.65 0.65 0.63  CALCIUM 7.2* 7.8* 8.0* 8.2*  MG  --   --  1.5* 1.8  PHOS  --   --  3.3 2.9   GFR: Estimated Creatinine Clearance: 65.4 mL/min (by C-G formula based on SCr of 0.63 mg/dL). Liver Function Tests: Recent Labs  Lab 06/26/21 1735 06/26/21 1907 06/28/21 0445  AST 24 26 27   ALT 18 20 18   ALKPHOS 132* 168* 157*   BILITOT 0.7 0.6 0.5  PROT 6.4* 7.5 6.7  ALBUMIN 2.0* 2.4* 2.1*   No results for input(s): LIPASE, AMYLASE in the last 168 hours. No results for input(s): AMMONIA in the last 168 hours. Coagulation Profile: No results for input(s): INR, PROTIME in the last 168 hours. Cardiac Enzymes: No results for input(s): CKTOTAL, CKMB, CKMBINDEX, TROPONINI in the last 168 hours. BNP (last 3 results) No results for input(s): PROBNP in the last 8760 hours. HbA1C: Recent Labs    06/26/21 1735  HGBA1C 6.4*   CBG: Recent Labs  Lab 06/27/21 2002 06/28/21 0009 06/28/21 0413 06/28/21 0749 06/28/21 1137  GLUCAP 218* 180* 226* 183* 216*   Lipid Profile: Recent Labs    06/27/21 0513  TRIG 83   Thyroid Function Tests: No results for input(s): TSH, T4TOTAL, FREET4, T3FREE, THYROIDAB in the last 72 hours. Anemia Panel: Recent Labs    06/28/21 0546  VITAMINB12 532   Urine analysis:    Component Value Date/Time   COLORURINE YELLOW (A) 02/05/2017 1752   APPEARANCEUR HAZY (A) 02/05/2017 1752   LABSPEC 1.027 02/05/2017 1752   PHURINE 5.0 02/05/2017 1752   GLUCOSEU 150 (A) 02/05/2017 1752   HGBUR NEGATIVE 02/05/2017 1752  BILIRUBINUR NEGATIVE 02/05/2017 1752   KETONESUR 5 (A) 02/05/2017 1752   PROTEINUR 30 (A) 02/05/2017 1752   NITRITE NEGATIVE 02/05/2017 1752   LEUKOCYTESUR NEGATIVE 02/05/2017 1752   Sepsis Labs: @LABRCNTIP (procalcitonin:4,lacticidven:4)  ) Recent Results (from the past 240 hour(s))  Resp Panel by RT-PCR (Flu A&B, Covid) Nasopharyngeal Swab     Status: None   Collection Time: 06/26/21  7:31 PM   Specimen: Nasopharyngeal Swab; Nasopharyngeal(NP) swabs in vial transport medium  Result Value Ref Range Status   SARS Coronavirus 2 by RT PCR NEGATIVE NEGATIVE Final    Comment: (NOTE) SARS-CoV-2 target nucleic acids are NOT DETECTED.  The SARS-CoV-2 RNA is generally detectable in upper respiratory specimens during the acute phase of infection. The  lowest concentration of SARS-CoV-2 viral copies this assay can detect is 138 copies/mL. A negative result does not preclude SARS-Cov-2 infection and should not be used as the sole basis for treatment or other patient management decisions. A negative result may occur with  improper specimen collection/handling, submission of specimen other than nasopharyngeal swab, presence of viral mutation(s) within the areas targeted by this assay, and inadequate number of viral copies(<138 copies/mL). A negative result must be combined with clinical observations, patient history, and epidemiological information. The expected result is Negative.  Fact Sheet for Patients:  EntrepreneurPulse.com.au  Fact Sheet for Healthcare Providers:  IncredibleEmployment.be  This test is no t yet approved or cleared by the [place] FDA and  has been authorized for detection and/or diagnosis of SARS-CoV-2 by FDA under an Emergency Use Authorization (EUA). This EUA will remain  in effect (meaning this test can be used) for the duration of the COVID-19 declaration under Section 564(b)(1) of the Act, 21 U.S.C.section 360bbb-3(b)(1), unless the authorization is terminated  or revoked sooner.       Influenza A by PCR NEGATIVE NEGATIVE Final   Influenza B by PCR NEGATIVE NEGATIVE Final    Comment: (NOTE) The Xpert Xpress SARS-CoV-2/FLU/RSV plus assay is intended as an aid in the diagnosis of influenza from Nasopharyngeal swab specimens and should not be used as a sole basis for treatment. Nasal washings and aspirates are unacceptable for Xpert Xpress SARS-CoV-2/FLU/RSV testing.  Fact Sheet for Patients: EntrepreneurPulse.com.au  Fact Sheet for Healthcare Providers: IncredibleEmployment.be  This test is not yet approved or cleared by the [place] FDA and has been authorized for detection and/or diagnosis of SARS-CoV-2 by FDA under  an Emergency Use Authorization (EUA). This EUA will remain in effect (meaning this test can be used) for the duration of the COVID-19 declaration under Section 564(b)(1) of the Act, 21 U.S.C. section 360bbb-3(b)(1), unless the authorization is terminated or revoked.  Performed at [facility], [address] [place], [place] [address]       Studies: No results found.  Scheduled Meds:  aspirin EC  81 mg Oral Daily   Chlorhexidine Gluconate Cloth  6 each Topical Daily   insulin aspart  0-15 Units Subcutaneous Q4H   loratadine  10 mg Oral Daily   losartan  25 mg Oral Daily   pravastatin  20 mg Oral Q supper   pregabalin  75 mg Oral BID    Continuous Infusions:  TPN ADULT (ION) 83 mL/hr at 06/27/21 1833   TPN ADULT (ION)       LOS: 2 days     Annita Brod, MD [facility]   06/28/2021, 2:35 PM

## 2021-06-28 NOTE — Evaluation (Addendum)
Physical Therapy Evaluation Patient Details Name: Miguel Flores MRN: [id number] DOB: 31-Aug-1941 Today's Date: 06/27/2021   History of Present Illness  80 y.o. 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 [name] who presented with draining enterocutaneous fistula.     Patient has a complicated recent abdominal surgery history (see Dr. Deniece Ree note from 8/17) that began 6 weeks ago with cholecystitis at a hospital in [facility].  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  He ultimately went home, initially doing well and then started having issues with the ileostomy. Late entry 8/19 for services rendered 8/18  Clinical Impression  Pt eager to try and work with PT, called nurse in to verify ok to remove Eakin pouch from suction and called in to assist with set up/management prior to mobility.  Despite best laid plans the pouch was readily leaking and though pt moved relatively well and showed decent confidence and safety with limited mobility the ileostomy issues were a significant limiter.  Unable to fully assess but pt does appear mobile enough to be safe at home from a PT stand-point, however equipment/medical issues make transition home a much more difficult proposition.     Follow Up Recommendations Supervision - Intermittent;Other (comment) (PT needed regardless of d/c venue: may need LTACH/SNF due to medical/equipment management - PT in this setting would be appropriate, if home HHPT)    Equipment Recommendations  None recommended by PT    Recommendations for Other Services       Precautions / Restrictions Precautions Precautions: Fall Restrictions Weight Bearing Restrictions: No      Mobility  Bed Mobility Overal bed mobility: Modified Independent             General  bed mobility comments: Pt was able to get himself up to EOB w/o direct assist.  Able to engage core reasonably well despite core, did rely on UE to fully upright    Transfers Overall transfer level: Modified independent Equipment used: Rolling walker (2 wheeled)             General transfer comment: Pt was able to rise to standing with relative ease, maintained upright w/o assist. Despite having nurse in room to assist/prep with Eakin pouch/vacc there was still significant leakage in standing  Ambulation/Gait Ambulation/Gait assistance: Min guard Gait Distance (Feet): 8 Feet Assistive device: Rolling walker (2 wheeled)       General Gait Details: Pt was easily able to do some limited distance ambulation (2/2 remaining hooked up to wall suction) and though he displayed good safety and ability to maintain upright his Eakin pouch started leaking and further upright/walking tasks were deferred  Stairs            Wheelchair Mobility    Modified Rankin (Stroke Patients Only)       Balance Overall balance assessment: Modified Independent                                           Pertinent Vitals/Pain Pain Assessment:  (only very minimal pain at the ostomy site)    Home Living Family/patient expects to be discharged to:: Private residence Living Arrangements: Alone Available Help at Discharge: Family (  sons have been providing 24/7 assist, apparently one is needing to return to work soon) Type of Home: House Home Access: [place]: Environmental consultant - 2 wheels;Cane - single point      Prior Function Level of Independence: Independent         Comments: apparently pt is typically able to be quite active, less so post surgery - and especially with recently with leaking/dysfunctioning ileostomy     Hand Dominance        Extremity/Trunk Assessment   Upper Extremity Assessment Upper Extremity Assessment: Overall WFL for tasks  assessed;Generalized weakness    Lower Extremity Assessment Lower Extremity Assessment: Overall WFL for tasks assessed;Generalized weakness       Communication      Cognition Arousal/Alertness: Awake/alert Behavior During Therapy: WFL for tasks assessed/performed Overall Cognitive Status: Within Functional Limits for tasks assessed                                        General Comments General comments (skin integrity, edema, etc.): Pt's mobility and safety were generally quite good but issues with equipment leaking have been and continue to be limiters    Exercises     Assessment/Plan    PT Assessment Patient needs continued PT services  PT Problem List Decreased strength;Decreased range of motion;Decreased activity tolerance;Decreased balance;Decreased mobility;Decreased safety awareness;Decreased knowledge of use of DME       PT Treatment Interventions DME instruction;Gait training;Functional mobility training;Stair training;Therapeutic activities;Therapeutic exercise;Balance training;Patient/family education    PT Goals (Current goals can be found in the Care Plan section)  Acute Rehab PT Goals Patient Stated Goal: get ostomy managed PT Goal Formulation: With patient Time For Goal Achievement: 07/11/21 Potential to Achieve Goals: Good    Frequency Min 2X/week   Barriers to discharge        Co-evaluation               AM-PAC PT "6 Clicks" Mobility  Outcome Measure Help needed turning from your back to your side while in a flat bed without using bedrails?: None Help needed moving from lying on your back to sitting on the side of a flat bed without using bedrails?: None Help needed moving to and from a bed to a chair (including a wheelchair)?: None Help needed standing up from a chair using your arms (e.g., wheelchair or bedside chair)?: None Help needed to walk in hospital room?: A Little Help needed climbing 3-5 steps with a railing? : A  Little 6 Click Score: 22    End of Session   Activity Tolerance: Patient tolerated treatment well;Other (comment) (limited due to equipment limitations/issues) Patient left: with bed alarm set;with call bell/phone within reach Nurse Communication: Mobility status (need for further work with ostomy management) PT Visit Diagnosis: Muscle weakness (generalized) (M62.81);Difficulty in walking, not elsewhere classified (R26.2)    Time: 1937-9024 PT Time Calculation (min) (ACUTE ONLY): 30 min   Charges:   PT Evaluation $PT Eval Low Complexity: 1 Low PT Treatments $Therapeutic Activity: 8-22 mins        Kreg Shropshire, DPT 06/28/2021, 8:45 AM

## 2021-06-28 NOTE — Consult Note (Signed)
PHARMACY - TOTAL PARENTERAL NUTRITION CONSULT NOTE   Indication: Fistula  Patient Measurements: Height: 5' 5.98" (167.6 cm) Weight: 74.4 kg (164 lb) IBW/kg (Calculated) : 63.76 TPN AdjBW (KG): 74.4 Body mass index is 26.48 kg/m.  Assessment:  Patient is an 80 y/o M with medical history including diabetes, HTN, colon cancer with lung metastases, recent history of admission at OSH for cholecystitis s/p cholecystectomy c/b post-op sepsis. Patient underwent ExLap with findings of small bowel injury which was repaired. 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 He is now presenting to the hospital for ileostomy leakage. Imaging notable for multiple intra-abdominal abscess with contrast extravasation to midline of abdominal wound. General surgery is following. Pharmacy consulted to continue home TPN while patient is admitted.  Glucose / Insulin: History of controlled diabetes (last Hgb A1c 6.9%).   on SSI q4h (moderate): 22 units/last 24 hrs Electrolytes: Hyponatremia Renal: Scr < 1 Hepatic: LFTs within normal limits Intake / Output; MIVF:  GI Imaging: 8/17 CT abdomen / pelvis: Large ventral wound containing small amount of contrast. Enterocutaneous fistula. Multiple intra-abdominal abscess(s). Mesenteric inflammation. Small bilateral pleural effusions with atelectasis or pneumonia at the bases. Metastatic lung nodules, slight increased size of right lower lobe metastatic nodule GI Surgeries / Procedures:  History of recent cholecystectomy, ExLap at OSH in 05/2021  Central access: POA TPN start date: 8/18   Outpatient TPN information: [facility] in [place], [place] Ph: [phone]  Nutritional Goals: Goal TPN rate is 83 mL/hr (provides 105.6 g of protein and 2262 kcals per day)  RD Assessment: Estimated Needs Total Energy Estimated Needs: 2000-2300kcal/day Total Protein Estimated Needs:  100-115g/day Total Fluid Estimated Needs: 2.0-2.3L/day  Current Nutrition:  TPN  Plan:  Continue in-house TPN formulation at 83 mL/hr (2092 mL w/ overfill) Nutritional components: Amino acids (Travasol 10%): 105.6 grams Lipids (20% SMOF): 68.8 grams Dextrose: 338.6 grams Electrolytes in TPN (standard): Na 25mEq/L, K 10mEq/L(increase K to 55 meq/L on 8/19), Ca 31mEq/L, Mg 58mEq/L, and Phos 55mmol/L. Cl:Ac 1:1 Add standard MVI and trace elements to TPN continue Moderate q4h SSI. Will add low dose of Lantus 5 units x 1 dose Electrolytes: Mag 1.8- will order another 2 grams IV magnesium sulfate x 1 K 3.8- will increase K in TPN Na 133- watch for need for adjustment Monitor TPN labs on Mon/Thurs, daily until stable F/u labs am 8/20  Wylene Weissman A 06/28/2021,8:35 AM

## 2021-06-28 NOTE — Progress Notes (Signed)
SLP Cancellation Note  Patient Details Name: Miguel Flores MRN: [id number] DOB: August 20, 1941   Cancelled treatment:       Reason Eval/Treat Not Completed: SLP screened, no needs identified, will sign off  Pt has been consuming a dysphagia 3 diet at baseline with no overt s/s of aspiration. Recommend continuing currently ordered diet at this time.   Ricci Dirocco B. Rutherford Nail M.S., CCC-SLP, [place] Office [phone]    Stormy Fabian 06/28/2021, 9:52 AM

## 2021-06-28 NOTE — Care Management Important Message (Signed)
Important Message  Patient Details  Name: Miguel Flores MRN: [id number] Date of Birth: Nov 26, 1940   Medicare Important Message Given:  Yes     Dannette Barbara 06/28/2021, 12:02 PM

## 2021-06-28 NOTE — TOC Initial Note (Addendum)
Transition of Care [facility]) - Initial/Assessment Note    Patient Details  Name: Miguel Flores MRN: [id number] Date of Birth: 04-08-1941  Transition of Care [facility]) CM/SW Contact:    Candie Chroman, LCSW Phone Number: 06/28/2021, 10:41 AM  Clinical Narrative:   CSW met with patient. Son, [name], at bedside. CSW introduced role and explained that discharge planning would be discussed. Patient's sons are going back to work next week so going home would not be an option due to care needs at this time. They had been trying to get him into [facility] when at hospital in [facility] but insurance denied. Sons appealed denial then appealed the next denial ([place]) to federal level. Son has not received update on this yet. Spoke with our [facility] liaison, Bowers. She will follow up with representative that was following him previously and call CSW back. Son provided contact number for infusion company. Spoke to Tammy with [facility]. She said they typically do not provide infusion services in SNF's because they usually have their own TPN pharmacy resources.             3:02 pm: Left voicemail for [facility] liaison to see if there were any updates.  Expected Discharge Plan:  (TBD) Barriers to Discharge: Continued Medical Work up   Patient Goals and CMS Choice        Expected Discharge Plan and Services Expected Discharge Plan:  (TBD)     Post Acute Care Choice:  (TBD) Living arrangements for the past 2 months: Single Family Home                                      Prior Living Arrangements/Services Living arrangements for the past 2 months: Single Family Home Lives with:: Self Patient language and need for interpreter reviewed:: Yes Do you feel safe going back to the place where you live?: Yes      Need for Family Participation in Patient Care: Yes (Comment) Care giver support system in place?: No (comment) Current home services: Home RN Criminal Activity/Legal Involvement  Pertinent to Current Situation/Hospitalization: No - Comment as needed  Activities of Daily Living Home Assistive Devices/Equipment: Eyeglasses, Gilford Rile (specify type) ADL Screening (condition at time of admission) Patient's cognitive ability adequate to safely complete daily activities?: Yes Is the patient deaf or have difficulty hearing?: No Does the patient have difficulty seeing, even when wearing glasses/contacts?: No Does the patient have difficulty concentrating, remembering, or making decisions?: No Patient able to express need for assistance with ADLs?: Yes Does the patient have difficulty dressing or bathing?: No Independently performs ADLs?: Yes (appropriate for developmental age) Does the patient have difficulty walking or climbing stairs?: No Weakness of Legs: None Weakness of Arms/Hands: None  Permission Sought/Granted                  Emotional Assessment Appearance:: Appears stated age Attitude/Demeanor/Rapport: Engaged, Gracious Affect (typically observed): Accepting, Appropriate, Calm, Pleasant Orientation: : Oriented to Self, Oriented to Place, Oriented to  Time, Oriented to Situation Alcohol / Substance Use: Not Applicable Psych Involvement: No (comment)  Admission diagnosis:  Abscess of intestine [K63.0] Hematochezia [K92.1] 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] Postprocedural intraabdominal abscess [T81.43XA] Patient Active Problem List   Diagnosis Date Noted   Malnutrition of moderate degree 06/27/2021   Post cholecystectomy intraabdominal abscess 06/26/2021   S/P cholecystectomy 06/26/2021   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  On total parenteral nutrition (TPN) 06/26/2021   Goals of care, counseling/discussion 12/20/2018   Lung metastases ([place]) 06/18/2018   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[place]) 10/20/2017   Pure hypercholesterolemia 08/14/2017   Vaccine counseling 03/09/2017   Essential hypertension 02/20/2017   Mixed hyperlipidemia 02/20/2017   Type  2 diabetes mellitus without complication, without long-term current use of insulin ([place]) 02/20/2017   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   Colonic mass    Large bowel obstruction ([place]) 02/05/2017   Senile purpura ([place]) 10/07/2016   Chronic eczema 02/04/2016   Normocytic anemia 02/04/2016   PCP:  [name], MD Pharmacy:   [facility] [place] [address] [place] [address] Phone: [phone] Fax: [place], [place] [address] Phone: [phone] Fax: [phone]  [id number] [place] [id number]: [phone]: [phone]    Social Determinants of Health (SDOH) Interventions    Readmission Risk Interventions No flowsheet data found.

## 2021-06-28 NOTE — Progress Notes (Signed)
[facility] SURGICAL PROGRESS NOTE (cpt [phone])  Hospital Day(s): 2.   Interval History: Patient seen and examined, no acute events or new complaints overnight. Patient reports he is doing okay this morning. He denies fever, chills, nausea, emesis, nor abdominal pain. His renal function remains in normal ranges; sCr - 0.63; UO - 200 ccs + unmeasured. Mild hyponatremia to 133 o/w no significant electrolyte derangements. No new imaging studies. He is on dysphagia 3 diet + TPN at goal rate. EC fistula being controlled with Eakin Pouch; output 500 ccs.   Working with [employer] on disposition.   Review of Systems:  Constitutional: denies fever, chills  HEENT: denies cough or congestion  Respiratory: denies any shortness of breath  Cardiovascular: denies chest pain or palpitations  Gastrointestinal: denies abdominal pain, N/V, or diarrhea/and bowel function as per interval history Genitourinary: denies burning with urination or urinary frequency  Vital signs in last 24 hours: [min-max] current  Temp:  [97.4 F (36.3 C)-98.3 F (36.8 C)] 97.5 F (36.4 C) (08/19 0415) Pulse Rate:  [87-92] 88 (08/19 0415) Resp:  [18-20] 20 (08/19 0415) BP: (130-141)/(54-68) 141/68 (08/19 0415) SpO2:  [94 %-98 %] 97 % (08/19 0415)     Height: 5' 5.98" (167.6 cm) Weight: 74.4 kg BMI (Calculated): 26.48   Intake/Output last 2 shifts:  08/18 0701 - 08/19 0700 In: 945.2 [P.O.:360; I.V.:35; IV Piggyback:550.2] Out: 700 [Urine:200; Drains:500]   Physical Exam:  Constitutional: alert, cooperative and no distress  HENT: normocephalic without obvious abnormality  Eyes: PERRL, EOM's grossly intact and symmetric  Respiratory: breathing non-labored at rest  Cardiovascular: regular rate and sinus rhythm  Gastrointestinal: Soft, non-tender, non-distended, no rebound/guarding. EC fistula to midline wound, effluent controlled with colostomy bag, some leaking inferiorly  Musculoskeletal: no edema or wounds,  motor and sensation grossly intact, NT    Labs:  CBC Latest Ref Rng & Units 06/27/2021 06/26/2021 05/07/2021  WBC 4.0 - 10.5 K/uL 7.5 8.1 8.0  Hemoglobin 13.0 - 17.0 g/dL 8.8(L) 8.4(L) 10.3(L)  Hematocrit 39.0 - 52.0 % 28.0(L) 28.6(L) 33.4(L)  Platelets 150 - 400 K/uL 298 260 194   CMP Latest Ref Rng & Units 06/28/2021 06/27/2021 06/26/2021  Glucose 70 - 99 mg/dL 208(H) 197(H) 199(H)  BUN 8 - 23 mg/dL 19 19 19   Creatinine 0.61 - 1.24 mg/dL 0.63 0.65 0.65  Sodium 135 - 145 mmol/L 133(L) 134(L) 131(L)  Potassium 3.5 - 5.1 mmol/L 3.8 3.0(L) 3.1(L)  Chloride 98 - 111 mmol/L 104 103 102  CO2 22 - 32 mmol/L 23 25 25   Calcium 8.9 - 10.3 mg/dL 8.2(L) 8.0(L) 7.8(L)  Total Protein 6.5 - 8.1 g/dL 6.7 - 7.5  Total Bilirubin 0.3 - 1.2 mg/dL 0.5 - 0.6  Alkaline Phos 38 - 126 U/L 157(H) - 168(H)  AST 15 - 41 U/L 27 - 26  ALT 0 - 44 U/L 18 - 20     Imaging studies: No new pertinent imaging studies   Assessment/Plan: (ICD-10's: K59.2) 80 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 with missed small bowel injury and multiple takebacks   - Continue PO intake as tolerated - Continue TPN to goal - Appreciate WOC RN assistance with EC fistula management; monitor output   - No definitive surgical intervention; He will need to be evaluated at tertiary center for this, likely in ~6 months    - Monitor abdominal examination; on-going bowel function   - Pain control prn; antiemetics prn - Mobilize with therapy as tolerated - Further management  per primary service   All of the above findings and recommendations were discussed with the patient, and the medical team, and all of patient's questions were answered to her expressed satisfaction.   -- Edison Simon, PA-C [facility] 06/28/2021, 7:29 AM [phone] M-F: 7am - 4pm

## 2021-06-29 DIAGNOSIS — C186 Malignant neoplasm of descending colon: Secondary | ICD-10-CM | POA: Diagnosis not present

## 2021-06-29 DIAGNOSIS — K632 Fistula of intestine: Secondary | ICD-10-CM | POA: Diagnosis not present

## 2021-06-29 DIAGNOSIS — D638 Anemia in other chronic diseases classified elsewhere: Secondary | ICD-10-CM | POA: Diagnosis not present

## 2021-06-29 DIAGNOSIS — E119 Type 2 diabetes mellitus without complications: Secondary | ICD-10-CM

## 2021-06-29 DIAGNOSIS — E871 Hypo-osmolality and hyponatremia: Secondary | ICD-10-CM

## 2021-06-29 DIAGNOSIS — C7800 Secondary malignant neoplasm of unspecified lung: Secondary | ICD-10-CM

## 2021-06-29 LAB — COMPREHENSIVE METABOLIC PANEL
ALT: 23 U/L (ref 0–44)
AST: 30 U/L (ref 15–41)
Albumin: 2.1 g/dL — ABNORMAL LOW (ref 3.5–5.0)
Alkaline Phosphatase: 172 U/L — ABNORMAL HIGH (ref 38–126)
Anion gap: 6 (ref 5–15)
BUN: 20 mg/dL (ref 8–23)
CO2: 22 mmol/L (ref 22–32)
Calcium: 8.5 mg/dL — ABNORMAL LOW (ref 8.9–10.3)
Chloride: 102 mmol/L (ref 98–111)
Creatinine, Ser: 0.6 mg/dL — ABNORMAL LOW (ref 0.61–1.24)
GFR, Estimated: >60 mL/min (ref >60)
Glucose, Bld: 221 mg/dL — ABNORMAL HIGH (ref 70–99)
Potassium: 4.5 mmol/L (ref 3.5–5.1)
Sodium: 130 mmol/L — ABNORMAL LOW (ref 135–145)
Total Bilirubin: 0.5 mg/dL (ref 0.3–1.2)
Total Protein: 6.7 g/dL (ref 6.5–8.1)

## 2021-06-29 LAB — GLUCOSE, CAPILLARY
Glucose-Capillary: 219 mg/dL — ABNORMAL HIGH (ref 70–99)
Glucose-Capillary: 193 mg/dL — ABNORMAL HIGH (ref 70–99)
Glucose-Capillary: 272 mg/dL — ABNORMAL HIGH (ref 70–99)
Glucose-Capillary: 228 mg/dL — ABNORMAL HIGH (ref 70–99)

## 2021-06-29 LAB — PHOSPHORUS: Phosphorus: 3.2 mg/dL (ref 2.5–4.6)

## 2021-06-29 LAB — MAGNESIUM: Magnesium: 1.8 mg/dL (ref 1.7–2.4)

## 2021-06-29 MED ORDER — BENZONATATE 100 MG PO CAPS
200.0000 mg | ORAL_CAPSULE | Freq: Three times a day (TID) | ORAL | Status: DC
  Administered 2021-06-29 – 2021-07-25 (×76): 200 mg via ORAL
  Filled 2021-06-29 (×77): qty 2

## 2021-06-29 MED ORDER — MAGNESIUM SULFATE 2 GM/50ML IV SOLN
2.0000 g | Freq: Once | INTRAVENOUS | Status: AC
  Administered 2021-06-29: 2 g via INTRAVENOUS
  Filled 2021-06-29: qty 50

## 2021-06-29 MED ORDER — INSULIN GLARGINE-YFGN 100 UNIT/ML ~~LOC~~ SOLN
8.0000 [IU] | Freq: Once | SUBCUTANEOUS | Status: AC
  Administered 2021-06-29: 8 [IU] via SUBCUTANEOUS
  Filled 2021-06-29: qty 0.08

## 2021-06-29 MED ORDER — TRACE MINERALS CU-MN-SE-ZN 300-55-60-3000 MCG/ML IV SOLN
INTRAVENOUS | Status: AC
  Filled 2021-06-29: qty 1055.8

## 2021-06-29 NOTE — Consult Note (Signed)
PHARMACY - TOTAL PARENTERAL NUTRITION CONSULT NOTE   Indication: Fistula  Patient Measurements: Height: 5' 5.98" (167.6 cm) Weight: 73.2 kg (161 lb 6 oz) IBW/kg (Calculated) : 63.76 TPN AdjBW (KG): 74.4 Body mass index is 26.06 kg/m.  Assessment:  Patient is an 80 y/o M with medical history including diabetes, HTN, colon cancer with lung metastases, recent history of admission at OSH for cholecystitis s/p cholecystectomy c/b post-op sepsis. Patient underwent ExLap with findings of small bowel injury which was repaired. 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 He is now presenting to the hospital for ileostomy leakage. Imaging notable for multiple intra-abdominal abscess with contrast extravasation to midline of abdominal wound. General surgery is following. Pharmacy consulted to continue home TPN while patient is admitted.  Glucose / Insulin: History of controlled diabetes (last Hgb A1c 6.9%).   on SSI q4h (moderate): 24 units/last 24 hrs One dose of Lantus 5 units on 8/19 at 1200 Electrolytes: Hyponatremia Renal: Scr < 1 Hepatic: LFTs within normal limits Intake / Output; MIVF:  GI Imaging: 8/17 CT abdomen / pelvis: Large ventral wound containing small amount of contrast. Enterocutaneous fistula. Multiple intra-abdominal abscess(s). Mesenteric inflammation. Small bilateral pleural effusions with atelectasis or pneumonia at the bases. Metastatic lung nodules, slight increased size of right lower lobe metastatic nodule GI Surgeries / Procedures:  History of recent cholecystectomy, ExLap at OSH in 05/2021  Central access: POA TPN start date: 8/18   Outpatient TPN information: [facility] in [place], [place] Ph: [phone]  Nutritional Goals: Goal TPN rate is 83 mL/hr (provides 105.6 g of protein and 2262 kcals per day)  RD Assessment: Estimated Needs Total Energy Estimated Needs:  2000-2300kcal/day Total Protein Estimated Needs: 100-115g/day Total Fluid Estimated Needs: 2.0-2.3L/day  Current Nutrition:  TPN  Plan:  Continue in-house TPN formulation at 83 mL/hr (2092 mL w/ overfill) Nutritional components: Amino acids (Travasol 10%): 105.6 grams Lipids (20% SMOF): 68.8 grams Dextrose: 338.6 grams Electrolytes in TPN (standard): Na 7mEq/L(increase Na to 55 meq/L on 8/20), K 29mEq/L(decrease K from 55 back to 50 mEq/L on 8/20), Ca 24mEq/L, Mg 60mEq/L, and Phos 45mmol/L. Cl:Ac 1:1 Add standard MVI and trace elements to TPN continue Moderate q4h SSI.(24 units of SSI in last 24 hrs) Will order Lantus 8 units x 1 dose today  Electrolytes: Mag 1.8- will order another 2 grams IV magnesium sulfate x 1 K 4.5- will decrease K in TPN back to standard amount Na 133- will increase Na in TPN Monitor TPN labs on Mon/Thurs, daily until stable F/u labs am 8/21  Camry Robello A 06/29/2021,10:04 AM

## 2021-06-29 NOTE — Progress Notes (Signed)
Patient ID: SALIK GREWELL, male   DOB: 1941/03/03, 80 y.o.   MRN: [id number] [facility] PROGRESS NOTE  SUDEEP SCHEIBEL [id number] DOB: 06/04/1941 DOA: 06/26/2021 PCP: [name], MD  HPI/Subjective: Patient is eating a little bit.  Also receiving TPN.  Patient finally had a ostomy bag that is not leaking.  Patient states he is walking with physical therapy.  Initially admitted on 06/26/2020 with problems with ileostomy  Objective: Vitals:   06/29/21 0733 06/29/21 1545  BP: (!) 144/65 134/61  Pulse: 88 89  Resp: (!) 24 20  Temp: 98.1 F (36.7 C) 98 F (36.7 C)  SpO2: 96% 99%    Intake/Output Summary (Last 24 hours) at 06/29/2021 1701 Last data filed at 06/29/2021 1136 Gross per 24 hour  Intake 2162.73 ml  Output 1210 ml  Net 952.73 ml   Filed Weights   06/26/21 1231 06/27/21 0223 06/29/21 0500  Weight: 77.1 kg 74.4 kg 73.2 kg    ROS: Review of Systems  Respiratory:  Negative for shortness of breath.   Cardiovascular:  Negative for chest pain.  Gastrointestinal:  Negative for abdominal pain, nausea and vomiting.  Exam: Physical Exam HENT:     Head: Normocephalic.     Mouth/Throat:     Pharynx: No oropharyngeal exudate.  Eyes:     General: Lids are normal.     Conjunctiva/sclera: Conjunctivae normal.     Pupils: Pupils are equal, round, and reactive to light.  Cardiovascular:     Rate and Rhythm: Normal rate and regular rhythm.     Heart sounds: Normal heart sounds, S1 normal and S2 normal.  Pulmonary:     Breath sounds: Normal breath sounds. No decreased breath sounds, wheezing, rhonchi or rales.  Abdominal:     Palpations: Abdomen is soft.     Tenderness: There is no abdominal tenderness.     Comments: Bag over fistula site.  Musculoskeletal:     Right ankle: No swelling.     Left ankle: No swelling.  Skin:    General: Skin is warm.     Findings: No rash.  Neurological:     Mental Status: He is alert and oriented to person, place, and  time.      Scheduled Meds:  aspirin EC  81 mg Oral Daily   Chlorhexidine Gluconate Cloth  6 each Topical Daily   insulin aspart  0-15 Units Subcutaneous Q4H   loratadine  10 mg Oral Daily   losartan  25 mg Oral Daily   pravastatin  20 mg Oral Q supper   pregabalin  75 mg Oral BID   Continuous Infusions:  TPN ADULT (ION) 83 mL/hr at 06/28/21 1832   TPN ADULT (ION)     Brief history Mr. Carnero is a 80 y.o. 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 [name] who presented with draining enterocutaneous fistula.  Patient has a complicated recent abdominal surgery history (see Dr. Deniece Ree note from 8/17) that began 6 weeks ago with cholecystitis at a hospital in [facility].  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   Attempts were made to discharge to LTAC, but this was denied by insurance and he was discharged home.  Family called the PCP stating that they did not have colostomy supplies, and PCP recommended the  patient go to the hospital by EMS for "evaluation".  Given the patient has no new symptoms, presumably this was intended to be evaluation by social work/case management.   Patient presented to the emergency room on 8/17 and at that time, CT scan noted small bowel with enteral contrast extending to the large ventral wound suggesting enterocutaneous fistula as well as multiple intra-abdominal rim-enhancing fluid collections.  Patient admitted to the hospitalist service with consultation by general surgery.    Assessment/Plan:  Enterocutaneous fistula.  Patient has a bag on that is finally catching with stool without leaking.  General surgery does not want to drain any of the fluid collections or operate at this point in time.  Holding off on antibiotics.  Patient on TPN for  extra nutrition.  Waiting to hear back from insurance authorization for LTAC facility.   Anemia of chronic disease.  Last hemoglobin 8.8 Hyponatremia.  TPN should improve this.  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  Follow-up with Dr. 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  Oral medications held on sliding scale insulin.      Code Status:     Code Status Orders  (From admission, onward)           Start     Ordered   06/26/21 2251  Full code  Continuous        06/26/21 2251           Code Status History     Date Active Date Inactive Code Status Order ID Comments User Context   10/20/2017 1248 10/24/2017 1504 Full Code [id number]  [name], MD Inpatient   02/05/2017 2245 02/11/2017 1950 Full Code [id number]  Olean Ree, MD ED      Advance Directive Documentation    Flowsheet Row Most Recent Value  Type of Advance Directive Healthcare Power of Attorney, Living will  Pre-existing out of facility DNR order (yellow form or pink MOST form) --  "MOST" Form in Place? --      Family Communication: Spoke with son on the phone Disposition Plan: Status is: Inpatient  Dispo: The patient is from: Home              Anticipated d/c is to: Possible LTAC              Patient currently waiting to hear back from insurance company about [place] facility   Difficult to place patient.  No.  Consultants: General surgery  Time spent: 28 minutes  [place]

## 2021-06-30 ENCOUNTER — Inpatient Hospital Stay: Payer: Medicare Other

## 2021-06-30 DIAGNOSIS — E1169 Type 2 diabetes mellitus with other specified complication: Secondary | ICD-10-CM

## 2021-06-30 DIAGNOSIS — E785 Hyperlipidemia, unspecified: Secondary | ICD-10-CM

## 2021-06-30 DIAGNOSIS — J90 Pleural effusion, not elsewhere classified: Secondary | ICD-10-CM

## 2021-06-30 DIAGNOSIS — C186 Malignant neoplasm of descending colon: Secondary | ICD-10-CM | POA: Diagnosis not present

## 2021-06-30 DIAGNOSIS — R059 Cough, unspecified: Secondary | ICD-10-CM | POA: Diagnosis not present

## 2021-06-30 DIAGNOSIS — C7800 Secondary malignant neoplasm of unspecified lung: Secondary | ICD-10-CM | POA: Diagnosis not present

## 2021-06-30 DIAGNOSIS — K632 Fistula of intestine: Secondary | ICD-10-CM | POA: Diagnosis not present

## 2021-06-30 LAB — BASIC METABOLIC PANEL
Anion gap: 3 — ABNORMAL LOW (ref 5–15)
BUN: 24 mg/dL — ABNORMAL HIGH (ref 8–23)
CO2: 25 mmol/L (ref 22–32)
Calcium: 8.9 mg/dL (ref 8.9–10.3)
Chloride: 102 mmol/L (ref 98–111)
Creatinine, Ser: 0.64 mg/dL (ref 0.61–1.24)
GFR, Estimated: >60 mL/min (ref >60)
Glucose, Bld: 174 mg/dL — ABNORMAL HIGH (ref 70–99)
Potassium: 4.7 mmol/L (ref 3.5–5.1)
Sodium: 130 mmol/L — ABNORMAL LOW (ref 135–145)

## 2021-06-30 LAB — GLUCOSE, CAPILLARY
Glucose-Capillary: 224 mg/dL — ABNORMAL HIGH (ref 70–99)
Glucose-Capillary: 226 mg/dL — ABNORMAL HIGH (ref 70–99)
Glucose-Capillary: 230 mg/dL — ABNORMAL HIGH (ref 70–99)
Glucose-Capillary: 235 mg/dL — ABNORMAL HIGH (ref 70–99)
Glucose-Capillary: 243 mg/dL — ABNORMAL HIGH (ref 70–99)
Glucose-Capillary: 248 mg/dL — ABNORMAL HIGH (ref 70–99)
Glucose-Capillary: 253 mg/dL — ABNORMAL HIGH (ref 70–99)

## 2021-06-30 LAB — MAGNESIUM: Magnesium: 1.9 mg/dL (ref 1.7–2.4)

## 2021-06-30 LAB — PHOSPHORUS: Phosphorus: 3.8 mg/dL (ref 2.5–4.6)

## 2021-06-30 MED ORDER — ALBUMIN HUMAN 25 % IV SOLN
12.5000 g | Freq: Every day | INTRAVENOUS | Status: DC
  Administered 2021-06-30 – 2021-07-04 (×5): 12.5 g via INTRAVENOUS
  Filled 2021-06-30: qty 50
  Filled 2021-06-30: qty 100
  Filled 2021-06-30: qty 50
  Filled 2021-06-30: qty 100
  Filled 2021-06-30: qty 50
  Filled 2021-06-30: qty 100

## 2021-06-30 MED ORDER — INSULIN GLARGINE-YFGN 100 UNIT/ML ~~LOC~~ SOLN
15.0000 [IU] | Freq: Once | SUBCUTANEOUS | Status: AC
  Administered 2021-06-30: 15 [IU] via SUBCUTANEOUS
  Filled 2021-06-30: qty 0.15

## 2021-06-30 MED ORDER — MAGNESIUM SULFATE 2 GM/50ML IV SOLN
2.0000 g | Freq: Once | INTRAVENOUS | Status: AC
  Administered 2021-06-30: 2 g via INTRAVENOUS
  Filled 2021-06-30: qty 50

## 2021-06-30 MED ORDER — FUROSEMIDE 10 MG/ML IJ SOLN
20.0000 mg | Freq: Two times a day (BID) | INTRAMUSCULAR | Status: DC
  Administered 2021-06-30 – 2021-07-02 (×4): 20 mg via INTRAVENOUS
  Filled 2021-06-30 (×4): qty 4

## 2021-06-30 MED ORDER — TRAVASOL 10 % IV SOLN
INTRAVENOUS | Status: AC
  Filled 2021-06-30: qty 1055.8

## 2021-06-30 MED ORDER — SODIUM CHLORIDE 0.9% FLUSH
10.0000 mL | Freq: Two times a day (BID) | INTRAVENOUS | Status: DC
  Administered 2021-06-30 – 2021-07-04 (×8): 10 mL
  Administered 2021-07-04: 20 mL
  Administered 2021-07-04 – 2021-07-25 (×37): 10 mL

## 2021-06-30 MED ORDER — SODIUM CHLORIDE 0.9% FLUSH
10.0000 mL | INTRAVENOUS | Status: DC | PRN
  Administered 2021-07-05 – 2021-07-11 (×2): 10 mL

## 2021-06-30 NOTE — Consult Note (Signed)
PHARMACY - TOTAL PARENTERAL NUTRITION CONSULT NOTE   Indication: Fistula  Patient Measurements: Height: 5' 5.98" (167.6 cm) Weight: 73.3 kg (161 lb 9.6 oz) IBW/kg (Calculated) : 63.76 TPN AdjBW (KG): 74.4 Body mass index is 26.1 kg/m.  Assessment:  Patient is an 80 y/o M with medical history including diabetes, HTN, colon cancer with lung metastases, recent history of admission at OSH for cholecystitis s/p cholecystectomy c/b post-op sepsis. Patient underwent ExLap with findings of small bowel injury which was repaired. 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 He is now presenting to the hospital for ileostomy leakage. Imaging notable for multiple intra-abdominal abscess with contrast extravasation to midline of abdominal wound. General surgery is following. Pharmacy consulted to continue home TPN while patient is admitted.  Glucose / Insulin: History of controlled diabetes (last Hgb A1c 6.9%).   on SSI q4h (moderate): 29 units/last 24 hrs One dose of Lantus 5 units on 8/19 at 1200 One dose Lantus 8 units 8/20 at 1140 Electrolytes: Hyponatremia Renal: Scr < 1 Hepatic: LFTs within normal limits Intake / Output; MIVF:  GI Imaging: 8/17 CT abdomen / pelvis: Large ventral wound containing small amount of contrast. Enterocutaneous fistula. Multiple intra-abdominal abscess(s). Mesenteric inflammation. Small bilateral pleural effusions with atelectasis or pneumonia at the bases. Metastatic lung nodules, slight increased size of right lower lobe metastatic nodule GI Surgeries / Procedures:  History of recent cholecystectomy, ExLap at OSH in 05/2021  Central access: POA TPN start date: 8/18   Outpatient TPN information: [facility] in [place], [place] Ph: [phone]  Nutritional Goals: Goal TPN rate is 83 mL/hr (provides 105.6 g of protein and 2262 kcals per day)  RD Assessment: Estimated Needs Total  Energy Estimated Needs: 2000-2300kcal/day Total Protein Estimated Needs: 100-115g/day Total Fluid Estimated Needs: 2.0-2.3L/day  Current Nutrition:  TPN  Plan:  Continue in-house TPN formulation at 83 mL/hr (2092 mL w/ overfill) Nutritional components: Amino acids (Travasol 10%): 105.6 grams Lipids (20% SMOF): 68.8 grams Dextrose: 338.6 grams Electrolytes in TPN (standard): Na 5mEq/L(increase Na from 55 meq/L to 60 meq/L on 8/21), K 36mEq/L, Ca 5 mEq/L, Mg 31mEq/L, and Phos 54mmol/L. Cl:Ac 1:1 Add standard MVI and trace elements to TPN continue Moderate q4h SSI.(29 units of SSI in last 24 hrs) BG 174-272. Will order Lantus 15 units x 1 dose today  Electrolytes: Mag 1.9- will order 2 grams IV magnesium sulfate x 1 Na 130- will increase Na in TPN Monitor TPN labs on Mon/Thurs, daily until stable F/u labs am 8/22  Hasset Chaviano A 06/30/2021,9:52 AM

## 2021-06-30 NOTE — Progress Notes (Signed)
Patient ID: Miguel Flores, male   DOB: 01/01/1941, 80 y.o.   MRN: [id number] [facility] PROGRESS NOTE  DANIEL RITTHALER DJS:[id number] DOB: 1941/02/14 DOA: 06/26/2021 PCP: [name], MD  HPI/Subjective: Patient seen this morning.  Felt okay.  Stated he walked around the nursing station.  Stated he did not get any breakfast.  I called the nursing staff to help him order his meals.  Patient does have some cough.  Objective: Vitals:   06/30/21 0447 06/30/21 0814  BP: (!) 142/62 121/60  Pulse: 89 90  Resp: 16 (!) 24  Temp: 97.8 F (36.6 C) 98.3 F (36.8 C)  SpO2: 99% 98%    Intake/Output Summary (Last 24 hours) at 06/30/2021 1311 Last data filed at 06/30/2021 0840 Gross per 24 hour  Intake 872.53 ml  Output 1575 ml  Net -702.47 ml   Filed Weights   06/27/21 0223 06/29/21 0500 06/30/21 0500  Weight: 74.4 kg 73.2 kg 73.3 kg    ROS: Review of Systems  Respiratory:  Positive for cough. Negative for shortness of breath.   Cardiovascular:  Negative for chest pain.  Gastrointestinal:  Negative for abdominal pain, nausea and vomiting.  Exam: Physical Exam HENT:     Head: Normocephalic.     Mouth/Throat:     Pharynx: No oropharyngeal exudate.  Eyes:     General: Lids are normal.     Conjunctiva/sclera: Conjunctivae normal.  Cardiovascular:     Rate and Rhythm: Normal rate and regular rhythm.     Heart sounds: Normal heart sounds, S1 normal and S2 normal.  Pulmonary:     Breath sounds: Examination of the right-lower field reveals decreased breath sounds. Examination of the left-lower field reveals decreased breath sounds and rhonchi. Decreased breath sounds and rhonchi present. No wheezing or rales.  Abdominal:     Palpations: Abdomen is soft.     Tenderness: There is no abdominal tenderness.     Comments: Ostomy present  Musculoskeletal:     Right lower leg: No swelling.     Left lower leg: No swelling.  Skin:    General: Skin is warm.     Findings: No  rash.  Neurological:     Mental Status: He is alert and oriented to person, place, and time.      Scheduled Meds:  aspirin EC  81 mg Oral Daily   benzonatate  200 mg Oral TID   Chlorhexidine Gluconate Cloth  6 each Topical Daily   furosemide  20 mg Intravenous BID   insulin aspart  0-15 Units Subcutaneous Q4H   loratadine  10 mg Oral Daily   losartan  25 mg Oral Daily   pravastatin  20 mg Oral Q supper   pregabalin  75 mg Oral BID   sodium chloride flush  10-40 mL Intracatheter Q12H   Continuous Infusions:  TPN ADULT (ION) 83 mL/hr at 06/29/21 1856   TPN ADULT (ION)      Assessment/Plan:  Enterocutaneous fistula.  The patient has a bag on the status finally catching the stool without leaking.  General surgery does not want to drain any fluid collections or operate at this point in time.  Patient on TPN for extra nutrition.  Still waiting to hear from insurance company about authorization for an [facility] facility. Cough.  Chest x-ray today shows mild interstitial edema, bilateral pleural effusion, right lower lobe mass again seen.  Left lingula last not as well seen as previous CT.  We will try Lasix IV  with IV albumin to try to get rid of fluid to see if that helps out with his cough. Sigmoid colon cancer metastatic to the lungs, follow-up with Dr. [name] as outpatient. Hyponatremia.  Continue TPN Moderate protein calorie malnutrition Essential hypertension on losartan. Type 2 diabetes mellitus with polyneuropathy and hyperlipidemia.  On Lyrica and pravastatin.  Patient on sliding scale insulin.  Received a dose of glargine insulin today with the TPN.        Code Status:     Code Status Orders  (From admission, onward)           Start     Ordered   06/26/21 2251  Full code  Continuous        06/26/21 2251           Code Status History     Date Active Date Inactive Code Status Order ID Comments User Context   10/20/2017 1248 10/24/2017 1504 Full Code [id number]   [name], MD Inpatient   02/05/2017 2245 02/11/2017 1950 Full Code [id number]  Olean Ree, MD ED      Advance Directive Documentation    Flowsheet Row Most Recent Value  Type of Advance Directive Healthcare Power of Attorney, Living will  Pre-existing out of facility DNR order (yellow form or pink MOST form) --  "MOST" Form in Place? --      Family Communication: Tried to call son Jori Moll but mailbox was full. Disposition Plan: Status is: Inpatient  Dispo: The patient is from: Home              Anticipated d/c is to: Possible LTAC              Patient currently awaiting insurance authorization to see if can go out to [facility] facility.   Difficult to place patient.  Potentially.  Consultants: General surgery  Time spent: 27 minutes  [place]

## 2021-07-01 DIAGNOSIS — K632 Fistula of intestine: Secondary | ICD-10-CM | POA: Diagnosis not present

## 2021-07-01 LAB — CBC WITH DIFFERENTIAL/PLATELET
Abs Immature Granulocytes: 0.02 10*3/uL (ref 0.00–0.07)
Basophils Absolute: 0.1 10*3/uL (ref 0.0–0.1)
Basophils Relative: 1 %
Eosinophils Absolute: 0.7 10*3/uL — ABNORMAL HIGH (ref 0.0–0.5)
Eosinophils Relative: 8 %
HCT: 29.1 % — ABNORMAL LOW (ref 39.0–52.0)
Hemoglobin: 9 g/dL — ABNORMAL LOW (ref 13.0–17.0)
Immature Granulocytes: 0 %
Lymphocytes Relative: 21 %
Lymphs Abs: 1.7 10*3/uL (ref 0.7–4.0)
MCH: 26.9 pg (ref 26.0–34.0)
MCHC: 30.9 g/dL (ref 30.0–36.0)
MCV: 87.1 fL (ref 80.0–100.0)
Monocytes Absolute: 0.9 10*3/uL (ref 0.1–1.0)
Monocytes Relative: 11 %
Neutro Abs: 5 10*3/uL (ref 1.7–7.7)
Neutrophils Relative %: 59 %
Platelets: 308 10*3/uL (ref 150–400)
RBC: 3.34 MIL/uL — ABNORMAL LOW (ref 4.22–5.81)
RDW: 17.9 % — ABNORMAL HIGH (ref 11.5–15.5)
Smear Review: NORMAL
WBC: 8.4 10*3/uL (ref 4.0–10.5)
nRBC: 0 % (ref 0.0–0.2)

## 2021-07-01 LAB — COMPREHENSIVE METABOLIC PANEL
ALT: 25 U/L (ref 0–44)
AST: 25 U/L (ref 15–41)
Albumin: 2.4 g/dL — ABNORMAL LOW (ref 3.5–5.0)
Alkaline Phosphatase: 175 U/L — ABNORMAL HIGH (ref 38–126)
Anion gap: 2 — ABNORMAL LOW (ref 5–15)
BUN: 33 mg/dL — ABNORMAL HIGH (ref 8–23)
CO2: 25 mmol/L (ref 22–32)
Calcium: 9.1 mg/dL (ref 8.9–10.3)
Chloride: 103 mmol/L (ref 98–111)
Creatinine, Ser: 0.8 mg/dL (ref 0.61–1.24)
GFR, Estimated: >60 mL/min (ref >60)
Glucose, Bld: 227 mg/dL — ABNORMAL HIGH (ref 70–99)
Potassium: 5.1 mmol/L (ref 3.5–5.1)
Sodium: 130 mmol/L — ABNORMAL LOW (ref 135–145)
Total Bilirubin: 0.5 mg/dL (ref 0.3–1.2)
Total Protein: 7.1 g/dL (ref 6.5–8.1)

## 2021-07-01 LAB — GLUCOSE, CAPILLARY
Glucose-Capillary: 228 mg/dL — ABNORMAL HIGH (ref 70–99)
Glucose-Capillary: 238 mg/dL — ABNORMAL HIGH (ref 70–99)
Glucose-Capillary: 260 mg/dL — ABNORMAL HIGH (ref 70–99)
Glucose-Capillary: 202 mg/dL — ABNORMAL HIGH (ref 70–99)
Glucose-Capillary: 246 mg/dL — ABNORMAL HIGH (ref 70–99)
Glucose-Capillary: 213 mg/dL — ABNORMAL HIGH (ref 70–99)

## 2021-07-01 LAB — MAGNESIUM: Magnesium: 1.9 mg/dL (ref 1.7–2.4)

## 2021-07-01 LAB — TRIGLYCERIDES: Triglycerides: 74 mg/dL (ref <150)

## 2021-07-01 LAB — PHOSPHORUS: Phosphorus: 4.1 mg/dL (ref 2.5–4.6)

## 2021-07-01 MED ORDER — INSULIN ASPART 100 UNIT/ML IJ SOLN
3.0000 [IU] | INTRAMUSCULAR | Status: DC
  Administered 2021-07-01 – 2021-07-03 (×10): 3 [IU] via SUBCUTANEOUS
  Filled 2021-07-01 (×10): qty 1

## 2021-07-01 MED ORDER — CALCIUM FOR TPN
INJECTION | INTRAVENOUS | Status: AC
Start: 2021-07-01 — End: 2021-07-02
  Filled 2021-07-01: qty 1055.8

## 2021-07-01 MED ORDER — INSULIN GLARGINE-YFGN 100 UNIT/ML ~~LOC~~ SOLN
10.0000 [IU] | Freq: Two times a day (BID) | SUBCUTANEOUS | Status: AC
  Administered 2021-07-01 (×2): 10 [IU] via SUBCUTANEOUS
  Filled 2021-07-01 (×2): qty 0.1

## 2021-07-01 NOTE — Plan of Care (Signed)
  Problem: Education: Goal: Knowledge of General Education information will improve Description: Including pain rating scale, medication(s)/side effects and non-pharmacologic comfort measures 07/01/2021 1846 by Felipa Emory, RN Outcome: Progressing 07/01/2021 1845 by Felipa Emory, RN Outcome: Progressing   Problem: Health Behavior/Discharge Planning: Goal: Ability to manage health-related needs will improve 07/01/2021 1846 by Felipa Emory, RN Outcome: Progressing 07/01/2021 1845 by Felipa Emory, RN Outcome: Progressing   Problem: Clinical Measurements: Goal: Ability to maintain clinical measurements within normal limits will improve 07/01/2021 1846 by Felipa Emory, RN Outcome: Progressing 07/01/2021 1845 by Felipa Emory, RN Outcome: Progressing Goal: Will remain free from infection 07/01/2021 1846 by Felipa Emory, RN Outcome: Progressing 07/01/2021 1845 by Felipa Emory, RN Outcome: Progressing Goal: Diagnostic test results will improve 07/01/2021 1846 by Felipa Emory, RN Outcome: Progressing 07/01/2021 1845 by Felipa Emory, RN Outcome: Progressing Goal: Respiratory complications will improve 07/01/2021 1846 by Felipa Emory, RN Outcome: Progressing 07/01/2021 1845 by Felipa Emory, RN Outcome: Progressing Goal: Cardiovascular complication will be avoided 07/01/2021 1846 by Felipa Emory, RN Outcome: Progressing 07/01/2021 1845 by Felipa Emory, RN Outcome: Progressing   Problem: Activity: Goal: Risk for activity intolerance will decrease 07/01/2021 1846 by Felipa Emory, RN Outcome: Progressing 07/01/2021 1845 by Felipa Emory, RN Outcome: Progressing   Problem: Nutrition: Goal: Adequate nutrition will be maintained 07/01/2021 1846 by Felipa Emory, RN Outcome: Progressing 07/01/2021 1845 by Felipa Emory, RN Outcome: Progressing   Problem: Coping: Goal: Level of anxiety will  decrease 07/01/2021 1846 by Felipa Emory, RN Outcome: Progressing 07/01/2021 1845 by Felipa Emory, RN Outcome: Progressing   Problem: Elimination: Goal: Will not experience complications related to bowel motility 07/01/2021 1846 by Felipa Emory, RN Outcome: Progressing 07/01/2021 1845 by Felipa Emory, RN Outcome: Progressing Goal: Will not experience complications related to urinary retention 07/01/2021 1846 by Felipa Emory, RN Outcome: Progressing 07/01/2021 1845 by Felipa Emory, RN Outcome: Progressing   Problem: Pain Managment: Goal: General experience of comfort will improve 07/01/2021 1846 by Felipa Emory, RN Outcome: Progressing 07/01/2021 1845 by Felipa Emory, RN Outcome: Progressing   Problem: Safety: Goal: Ability to remain free from injury will improve 07/01/2021 1846 by Felipa Emory, RN Outcome: Progressing 07/01/2021 1845 by Felipa Emory, RN Outcome: Progressing   Problem: Skin Integrity: Goal: Risk for impaired skin integrity will decrease 07/01/2021 1846 by Felipa Emory, RN Outcome: Progressing 07/01/2021 1845 by Felipa Emory, RN Outcome: Progressing

## 2021-07-01 NOTE — Progress Notes (Addendum)
Physical Therapy Treatment Patient Details Name: Miguel Flores MRN: [id number] DOB: [birth year] Today's Date: 07/01/2021    History of Present Illness 80 y.o. 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 [name] who presented with draining enterocutaneous fistula.     Patient has a complicated recent abdominal surgery history (see Dr. Deniece Ree note from 8/17) that began 6 weeks ago with cholecystitis at a hospital in [facility].  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  He ultimately went home, initially doing well and then started having issues with the ileostomy.    PT Comments    Ready for session.  OK by RN for mobility.  OOB with min a x 1.  Accepted assist to raise upper body.  Steady in sitting.  He is able to stand and walk x 1 around unit with RW and min guard.  Overall steady but does endorse 10/10 fatigue and requests to return to bed after session.  Stated gait felt harder today.  He is noted to have a cough with mobility which he stated is not typical for him.   Follow Up Recommendations  Supervision - Intermittent;Other (comment)Supervision - Intermittent;Other (comment) (PT needed regardless of d/c venue: may need LTACH/SNF due to medical/equipment management - PT in this setting would be appropriate, if home HHPT)       Equipment Recommendations  None recommended by PT    Recommendations for Other Services       Precautions / Restrictions Precautions Precautions: Fall Precaution Comments: ostomy and IV's Restrictions Weight Bearing Restrictions: No    Mobility  Bed Mobility Overal bed mobility: Needs Assistance Bed Mobility: Sit to Supine;Supine to Sit     Supine to sit: Min assist Sit to supine: Supervision        Transfers Overall transfer level:  Needs assistance Equipment used: Rolling walker (2 wheeled) Transfers: Sit to/from Stand Sit to Stand: Min guard            Ambulation/Gait Ambulation/Gait assistance: Min guard Gait Distance (Feet): 160 Feet Assistive device: Rolling walker (2 wheeled) Gait Pattern/deviations: Step-through pattern Gait velocity: decreased   General Gait Details: completed lap but endoresed 10/10 fatigue.  stated it was harder than yesterday   Stairs             Wheelchair Mobility    Modified Rankin (Stroke Patients Only)       Balance Overall balance assessment: Needs assistance Sitting-balance support: Feet supported Sitting balance-Leahy Scale: Good     Standing balance support: Bilateral upper extremity supported Standing balance-Leahy Scale: Good                              Cognition Arousal/Alertness: Awake/alert Behavior During Therapy: WFL for tasks assessed/performed Overall Cognitive Status: Within Functional Limits for tasks assessed                                        Exercises      General Comments        Pertinent Vitals/Pain Pain Assessment: No/denies pain    Home Living  Prior Function            PT Goals (current goals can now be found in the care plan section) Progress towards PT goals: Progressing toward goals    Frequency    Min 2X/week      PT Plan Current plan remains appropriate    Co-evaluation              AM-PAC PT "6 Clicks" Mobility   Outcome Measure  Help needed turning from your back to your side while in a flat bed without using bedrails?: None Help needed moving from lying on your back to sitting on the side of a flat bed without using bedrails?: A Little Help needed moving to and from a bed to a chair (including a wheelchair)?: A Little Help needed standing up from a chair using your arms (e.g., wheelchair or bedside chair)?: A Little Help  needed to walk in hospital room?: A Little Help needed climbing 3-5 steps with a railing? : A Little 6 Click Score: 19    End of Session Equipment Utilized During Treatment: Gait belt Activity Tolerance: Patient tolerated treatment well;Other (comment) Patient left: with bed alarm set;with call bell/phone within reach;in bed Nurse Communication: Mobility status PT Visit Diagnosis: Muscle weakness (generalized) (M62.81);Difficulty in walking, not elsewhere classified (R26.2)     Time: 2244-9753 PT Time Calculation (min) (ACUTE ONLY): 19 min  Charges:  $Gait Training: 8-22 mins                    Chesley Noon, PTA 07/01/21, 9:43 AM , 9:41 AM

## 2021-07-01 NOTE — Plan of Care (Signed)

## 2021-07-01 NOTE — TOC Progression Note (Signed)
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 MD had already notified Tim that patient would not be a candidate for LTACH  Reiterated  th fact that placement at SNF with TPN would be very difficult, however I will still complete a bed search. Discussed that discission would likely need to be home with continuation of home heaths services and TPN.  Tim expressed concerns about patient being alone during the day while his 2 sons are at work.  Tim states they have been working with [employer] in order to get approval for [employer]. Tim agreeable to [place] from [employer] reaching out and assisting with SNF TPN placement or PCS. [name] is aware that is patient returns home with home health services he and his brother will have to assist the patient with TPN and ostomy care  Receive call from son [name].  Reviewed the above information with him as well.  He states that [name] will be the point person for communication   Danielle with [employer] to reach out to [employer]  Expected Discharge Plan:  (TBD) Barriers to Discharge: Continued Medical Work up  Expected Discharge Plan and Services Expected Discharge Plan:  (TBD)     Post Acute Care Choice:  (TBD) Living arrangements for the past 2 months: Single Family Home                                       Social Determinants of Health (SDOH) Interventions    Readmission Risk Interventions No flowsheet data found.

## 2021-07-01 NOTE — Progress Notes (Signed)
Patient ID: Miguel Flores, male   DOB: [birth year], 80 y.o.   MRN: [id number] [facility] PROGRESS NOTE  Miguel Flores:[id number] DOB: 1941/02/22 DOA: 06/26/2021 PCP: [name], MD  HPI/Subjective: Patient feeling okay.  Walking around well.  Started on Lasix yesterday with slight cough.  No pain.  Admitted with leaking ileostomy.  Patient family concerned that he lives by himself.  Objective: Vitals:   07/01/21 0346 07/01/21 0743  BP: (!) 121/52 125/60  Pulse: 90 89  Resp: 20 20  Temp: 98.6 F (37 C) 97.7 F (36.5 C)  SpO2: 100% 98%    Intake/Output Summary (Last 24 hours) at 07/01/2021 1541 Last data filed at 07/01/2021 1015 Gross per 24 hour  Intake 638.75 ml  Output 820 ml  Net -181.25 ml   Filed Weights   06/29/21 0500 06/30/21 0500 07/01/21 0346  Weight: 73.2 kg 73.3 kg 68.7 kg    ROS: Review of Systems  Respiratory:  Positive for cough. Negative for shortness of breath.   Cardiovascular:  Negative for chest pain.  Gastrointestinal:  Negative for abdominal pain, nausea and vomiting.  Exam: Physical Exam HENT:     Head: Normocephalic.     Mouth/Throat:     Pharynx: No oropharyngeal exudate.  Eyes:     General: Lids are normal.     Conjunctiva/sclera: Conjunctivae normal.  Cardiovascular:     Rate and Rhythm: Normal rate and regular rhythm.     Heart sounds: Normal heart sounds, S1 normal and S2 normal.  Pulmonary:     Breath sounds: Examination of the right-lower field reveals decreased breath sounds and rhonchi. Examination of the left-lower field reveals decreased breath sounds and rhonchi. Decreased breath sounds and rhonchi present. No wheezing or rales.  Abdominal:     Palpations: Abdomen is soft.     Tenderness: There is no abdominal tenderness.     Comments: ostomy present  Musculoskeletal:     Right lower leg: No swelling.     Left lower leg: No swelling.  Skin:    General: Skin is warm.     Findings: No rash.  Neurological:      Mental Status: He is alert and oriented to person, place, and time.      Scheduled Meds:  aspirin EC  81 mg Oral Daily   benzonatate  200 mg Oral TID   Chlorhexidine Gluconate Cloth  6 each Topical Daily   furosemide  20 mg Intravenous BID   insulin aspart  0-15 Units Subcutaneous Q4H   insulin aspart  3 Units Subcutaneous Q4H   insulin glargine-yfgn  10 Units Subcutaneous BID   loratadine  10 mg Oral Daily   losartan  25 mg Oral Daily   pravastatin  20 mg Oral Q supper   pregabalin  75 mg Oral BID   sodium chloride flush  10-40 mL Intracatheter Q12H   Continuous Infusions:  albumin human 12.5 g (07/01/21 0911)   TPN ADULT (ION) 83 mL/hr at 06/30/21 1814   TPN ADULT (ION)      Assessment/Plan:  Enterocutaneous fistula.  The patient has a bag on his fistula that is finally catching the stool.  General surgery would like his nutritional status to be better and would recommend tertiary care center for final operation in about 6 months.  Transitional care team looking into whether an LTAC facility would be an option. Cough.  Some interstitial edema and bilateral pleural effusions.  Improved with IV Lasix and IV albumin.  We will switch over to oral Lasix tomorrow. 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  Follow-up with Dr. [name] as outpatient Hyponatremia.  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  On Lyrica and pravastatin.  Patient is on glargine insulin while on TPN.    Code Status:     Code Status Orders  (From admission, onward)           Start     Ordered   06/26/21 2251  Full code  Continuous        06/26/21 2251           Code Status History     Date Active Date Inactive Code Status Order ID Comments User Context   10/20/2017 1248 10/24/2017 1504 Full Code [id number]  [name], MD Inpatient   02/05/2017 2245 02/11/2017 1950 Full Code [id number]   Olean Ree, MD ED      Advance Directive Documentation    Flowsheet Row Most Recent Value  Type of Advance Directive Healthcare Power of Attorney, Living will  Pre-existing out of facility DNR order (yellow form or pink MOST form) --  "MOST" Form in Place? --      Family Communication: Spoke with son on the phone Disposition Plan: Status is: Inpatient  Dispo: The patient is from: Home              Anticipated d/c is to: LTAC versus home with home health depending on insurance              Patient currently medically stable for disposition.  If going home with home health will have to need supplies including ostomy and TPN.  If going to LTAC can go in anytime.   Difficult to place patient.  Yes.  Consultants: General surgery  Time spent: 27 minutes  [place]

## 2021-07-01 NOTE — Care Management Important Message (Signed)
Important Message  Patient Details  Name: Miguel Flores MRN: [id number] Date of Birth: February 23, 1941   Medicare Important Message Given:  Yes     Dannette Barbara 07/01/2021, 12:56 PM

## 2021-07-01 NOTE — Progress Notes (Signed)
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  He did had evidence of metastatic lung disease but is was able to be controlled with chemotherapy.  He presented to [facility] 6 weeks ago for acute cholecystitis and underwent laparoscopic cholecystectomy on May 16, 2021.  His hospitalization was complicated by missed bowel injury requiring exploratory laparotomy and multiple interventions.  Eventually the patient developed enterocutaneous fistula and was in the hospital for over 4 weeks.  Now he presents with issues regarding enterocutaneous fistula and the Eakin pouch leaking. I personally reviewed the CT scan and there is evidence of a small collections that given his primary pathology I will not attempt any percutaneous drainage.  This may lead to more complications given his EC fistula and friable bowel.   I discussed with his son in detail that this is a very complex issue.  He is still at the very early stages where wound care and TPN will take priority.  Given our limited resources I do think that he has better chance at a tertiary facility.  I will be happy to help coordinate his wound care and nutritional needs but he is definitive surgery will have to be done at a tertiary facility where interventional radiology, advanced endoscopy and advance nutritional teams as well as wound care teams are available. Definitive surgery will need to wait at a minimum 6 months to allow a procedure to be safely performed.    520 cc from fistula recorded. No leaks over 24 hrs, taking PO    Objective: Vital signs in last 24 hours: Temp:  [97.5 F (36.4 C)-98.6 F (37 C)] 97.7 F (36.5 C) (08/22 0743) Pulse Rate:  [89-94] 89 (08/22 0743) Resp:  [20-21] 20 (08/22 0743) BP: (104-125)/(52-60) 125/60 (08/22 0743) SpO2:  [98 %-100 %] 98 % (08/22 0743) Weight:  [68.7 kg] 68.7 kg (08/22  0346) Last BM Date: 06/29/21  Intake/Output from previous day: 08/21 0701 - 08/22 0700 In: 518.8 [I.V.:518.8] Out: 995 [Urine:300; Drains:520; Stool:175] Intake/Output this shift: Total I/O In: 120 [P.O.:120] Out: -   Physical exam:  NAD, alert Chest CTA, NSR Abd: soft, colostomy bag around EC fistula, enteric content ( proximal) likely jejunum No peritonitis Ext: no edema and well perfused  Lab Results: CBC  Recent Labs    07/01/21 0546  WBC 8.4  HGB 9.0*  HCT 29.1*  PLT 308   BMET Recent Labs    06/30/21 0611 07/01/21 0546  NA 130* 130*  K 4.7 5.1  CL 102 103  CO2 25 25  GLUCOSE 174* 227*  BUN 24* 33*  CREATININE 0.64 0.80  CALCIUM 8.9 9.1   PT/INR No results for input(s): LABPROT, INR in the last 72 hours. ABG No results for input(s): PHART, HCO3 in the last 72 hours.  Invalid input(s): PCO2, PO2  Studies/Results: DG Chest 2 View  Result Date: 06/30/2021 CLINICAL DATA:  Cough for 2 months. EXAM: CHEST - 2 VIEW COMPARISON:  CT of the chest on 06/12/2021, CT of the chest, abdomen, and pelvis on 03/13/2021 FINDINGS: Patient's LEFT-sided power port, tip overlying the level of the UPPER RIGHT atrium. RIGHT-sided PICC line tip overlies the superior vena cava. Heart is mildly enlarged. There are chronic Kerley B-lines in the lung bases, LEFT greater than RIGHT. Bilateral pleural effusions are present. RIGHT LOWER lobe mass measures 1.8 centimeters. Bibasilar atelectasis or infiltrates. IMPRESSION: Bilateral pleural effusions and bibasilar infiltrates. 1. Mild  interstitial edema. 2. RIGHT LOWER lobe mass again noted. LEFT lingular mass is not as well seen as on recent CT. Electronically Signed   By: Nolon Nations M.D.   On: 06/30/2021 12:21    Anti-infectives: Anti-infectives (From admission, onward)    None       Assessment/Plan:  Enterocutaneous fistula high output , currently seems to be controlled with ostomy bag.  Continue TPN and enteral  nutrition.  No need for surgical intervention.  No evidence of sepsis or active infection at this time.  We will follow up on a as needed basis in the inpatient side.  I will follow him up in a couple weeks in my office and coordinate outpatient evaluation by [facility] or [facility].  Please note that I spent at least 35 minutes in this encounter with greater than 50% spent in coordination counseling of his care Caroleen Hamman, MD, [facility]  07/01/2021

## 2021-07-01 NOTE — Consult Note (Signed)
PHARMACY - TOTAL PARENTERAL NUTRITION CONSULT NOTE   Indication: Fistula  Patient Measurements: Height: 5' 5.98" (167.6 cm) Weight: 68.7 kg (151 lb 7.3 oz) IBW/kg (Calculated) : 63.76 TPN AdjBW (KG): 74.4 Body mass index is 24.46 kg/m.  Assessment:  Patient is an 80 y/o M with medical history including diabetes, HTN, colon cancer with lung metastases, recent history of admission at OSH for cholecystitis s/p cholecystectomy c/b post-op sepsis. Patient underwent ExLap with findings of small bowel injury which was repaired. 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 He is now presenting to the hospital for ileostomy leakage. Imaging notable for multiple intra-abdominal abscess with contrast extravasation to midline of abdominal wound. General surgery is following. Pharmacy consulted to continue home TPN while patient is admitted.  Glucose / Insulin: History of controlled diabetes (last Hgb A1c 6.9%).   on SSI q4h (moderate): 33 units/last 24 hrs One dose of Lantus 15 units on 8/21 at 1219 Electrolytes: Hyponatremia, Hyperkalemic (5.1)  Renal: Scr < 1 Hepatic: LFTs within normal limits, except ALF trending up Intake / Output; MIVF: NS @ 10 ml/hr GI Imaging: 8/17 CT abdomen / pelvis: Large ventral wound containing small amount of contrast. Enterocutaneous fistula. Multiple intra-abdominal abscess(s). Mesenteric inflammation. Small bilateral pleural effusions with atelectasis or pneumonia at the bases. Metastatic lung nodules, slight increased size of right lower lobe metastatic nodule GI Surgeries / Procedures:  History of recent cholecystectomy, ExLap at OSH in 05/2021  Central access: POA TPN start date: 8/18   Outpatient TPN information: [facility] in [place], [place] Ph: [phone]  Nutritional Goals: Goal TPN rate is 83 mL/hr (provides 105.6 g of protein and 2262 kcals per day)  RD  Assessment: Estimated Needs Total Energy Estimated Needs: 2000-2300kcal/day Total Protein Estimated Needs: 100-115g/day Total Fluid Estimated Needs: 2.0-2.3L/day  Current Nutrition:  TPN  Plan:  Continue in-house TPN formulation at 83 mL/hr (2092 mL w/ overfill) Nutritional components: Amino acids (Travasol 10%): 105.6 grams Lipids (20% SMOF): 68.8 grams Dextrose: 338.6 grams Electrolytes in TPN (standard): Na 42mEq/L(increase Na from 55 meq/L to 60 meq/L on 8/21), K 25 mEq/L, Ca 5 mEq/L, Mg 35mEq/L, and Phos 72mmol/L. Cl:Ac 1:1 Add standard MVI and trace elements to TPN continue Moderate q4h SSI.(33 units of SSI in last 24 hrs) and Lantus 15 units x 1. BG 230-260. Will add Lantus 10 units x 2 doses and mealtime coverage 3 units q4hr. Electrolytes: Mag 1.9- Na 130- Na increased to 60 mEq/L K+ 5.1.- will decrease potassium to 25 mEq/L.  Monitor TPN labs on Mon/Thurs, daily until stable F/u labs am 8/23  Wynelle Cleveland, PharmD Pharmacy Resident  07/01/2021 9:36 AM

## 2021-07-01 NOTE — Consult Note (Signed)
[place] Nurse wound follow up Patient receiving care in [facility] 225. I am happy to document that the ostomy pouching system with belt that I placed at 0910 on Friday 8/19 remains in place without leakage.  Supplies are in the room for future use; no need to change at this time. Val Riles, RN, MSN, CWOCN, CNS-BC, pager [phone]

## 2021-07-02 DIAGNOSIS — G9341 Metabolic encephalopathy: Secondary | ICD-10-CM

## 2021-07-02 DIAGNOSIS — Z515 Encounter for palliative care: Secondary | ICD-10-CM | POA: Diagnosis not present

## 2021-07-02 DIAGNOSIS — K9413 Enterostomy malfunction: Secondary | ICD-10-CM | POA: Diagnosis not present

## 2021-07-02 DIAGNOSIS — J90 Pleural effusion, not elsewhere classified: Secondary | ICD-10-CM | POA: Diagnosis not present

## 2021-07-02 DIAGNOSIS — K632 Fistula of intestine: Secondary | ICD-10-CM | POA: Diagnosis not present

## 2021-07-02 DIAGNOSIS — R059 Cough, unspecified: Secondary | ICD-10-CM | POA: Diagnosis not present

## 2021-07-02 LAB — GLUCOSE, CAPILLARY
Glucose-Capillary: 128 mg/dL — ABNORMAL HIGH (ref 70–99)
Glucose-Capillary: 186 mg/dL — ABNORMAL HIGH (ref 70–99)
Glucose-Capillary: 195 mg/dL — ABNORMAL HIGH (ref 70–99)
Glucose-Capillary: 200 mg/dL — ABNORMAL HIGH (ref 70–99)
Glucose-Capillary: 205 mg/dL — ABNORMAL HIGH (ref 70–99)
Glucose-Capillary: 250 mg/dL — ABNORMAL HIGH (ref 70–99)
Glucose-Capillary: 263 mg/dL — ABNORMAL HIGH (ref 70–99)

## 2021-07-02 LAB — BASIC METABOLIC PANEL
Anion gap: 11 (ref 5–15)
BUN: 46 mg/dL — ABNORMAL HIGH (ref 8–23)
CO2: 20 mmol/L — ABNORMAL LOW (ref 22–32)
Calcium: 9.5 mg/dL (ref 8.9–10.3)
Chloride: 100 mmol/L (ref 98–111)
Creatinine, Ser: 0.88 mg/dL (ref 0.61–1.24)
GFR, Estimated: >60 mL/min (ref >60)
Glucose, Bld: 256 mg/dL — ABNORMAL HIGH (ref 70–99)
Potassium: 4.6 mmol/L (ref 3.5–5.1)
Sodium: 131 mmol/L — ABNORMAL LOW (ref 135–145)

## 2021-07-02 MED ORDER — INSULIN GLARGINE-YFGN 100 UNIT/ML ~~LOC~~ SOLN
10.0000 [IU] | Freq: Two times a day (BID) | SUBCUTANEOUS | Status: AC
  Administered 2021-07-02 – 2021-07-03 (×3): 10 [IU] via SUBCUTANEOUS
  Filled 2021-07-02 (×3): qty 0.1

## 2021-07-02 MED ORDER — FUROSEMIDE 20 MG PO TABS
20.0000 mg | ORAL_TABLET | Freq: Every day | ORAL | Status: DC
  Administered 2021-07-03 – 2021-07-25 (×21): 20 mg via ORAL
  Filled 2021-07-02 (×22): qty 1

## 2021-07-02 MED ORDER — TRACE MINERALS CU-MN-SE-ZN 300-55-60-3000 MCG/ML IV SOLN
INTRAVENOUS | Status: AC
  Filled 2021-07-02: qty 1055.8

## 2021-07-02 MED ORDER — FUROSEMIDE 20 MG PO TABS: 20.0000 mg | ORAL_TABLET | Freq: Two times a day (BID) | ORAL | Status: DC

## 2021-07-02 NOTE — Consult Note (Addendum)
[facility]  [phone] [phone]   Name: Miguel Flores Date: 07/02/2021 MRN: [id number]  DOB: 25-Mar-1941  Patient Care Team: [name], MD as PCP - General (Family Medicine) Sindy Guadeloupe, MD as Consulting Physician (Hematology and Oncology)    REASON FOR CONSULTATION: Miguel Flores is a 80 y.o. 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  Patient was ultimately discharged home and was readmitted here on 06/26/2021 with an ileostomy leak.  CT was concerning for multiple abdominal abscesses.  Patient has been seen by general surgery and is not felt to be a current surgical candidate with recommendation for possible future surgical repair at a tertiary center when he is well enough.  Palliative care was consulted to address goals.  SOCIAL HISTORY:     reports that he has never smoked. He has never used smokeless tobacco. He reports that he does not drink alcohol and does not use drugs.  Patient is a widower.  He lives at home alone.  He has two sons who are involved in his care.  Patient previously worked for [employer].  ADVANCE DIRECTIVES:  Not on file  CODE STATUS: Full code  PAST MEDICAL HISTORY: Past Medical History:  Diagnosis Date   Cancer ([place])    skin cancer-nose w graft,also shoulder- basal cell per pt   Cataract    r eye   Chronic kidney disease    kidney stones 20 y ago per pt   Colon cancer ([place]) 2018   Surgical resection and chemo tx's.   Diabetes mellitus without complication ([place])    Eczema    History of kidney stones    Hyperlipidemia    Hypertension    neuropathy from chemo    Vertigo     PAST  SURGICAL HISTORY:  Past Surgical History:  Procedure Laterality Date   CATARACT EXTRACTION Left    COLECTOMY WITH COLOSTOMY CREATION/HARTMANN PROCEDURE N/A 02/06/2017   Procedure: COLECTOMY WITH COLOSTOMY CREATION/HARTMANN PROCEDURE;  Surgeon: [name], MD;  Location: [facility] ORS;  Service: General;  Laterality: N/A;   COLON SURGERY     COLONOSCOPY WITH PROPOFOL N/A 09/04/2017   Procedure: COLONOSCOPY WITH PROPOFOL;  Surgeon: Jonathon Bellows, MD;  Location: [facility] ENDOSCOPY;  Service: Gastroenterology;  Laterality: N/A;   COLOSTOMY CLOSURE N/A 10/20/2017   Procedure: COLOSTOMY CLOSURE;  Surgeon: [name], MD;  Location: [facility] ORS;  Service: General;  Laterality: N/A;   CYSTOSCOPY     FLEXIBLE SIGMOIDOSCOPY N/A 02/06/2017   Procedure: FLEXIBLE SIGMOIDOSCOPY;  Surgeon: Christene Lye, MD;  Location: [facility] ENDOSCOPY;  Service: Endoscopy;  Laterality: N/A;   PORTACATH PLACEMENT N/A 06/17/2018   Procedure: INSERTION PORT-A-CATH, WITH FLUOROSCOPY;  Surgeon: [name], MD;  Location: [facility] ORS;  Service: General;  Laterality: N/A;    HEMATOLOGY/ONCOLOGY HISTORY:  Oncology History  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[place])  02/20/2017 Initial Diagnosis   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[place])   06/22/2018 - 08/04/2019 Chemotherapy   The patient had dexamethasone (DECADRON) 4 MG tablet, 8 mg, Oral, Daily, 1 of 1 cycle, Start date: 06/18/2018, End date: 06/22/2018 palonosetron (ALOXI) injection 0.25 mg, 0.25 mg, Intravenous,  Once, 25 of 25 cycles Administration: 0.25 mg (06/22/2018), 0.25 mg (07/06/2018), 0.25 mg (07/27/2018), 0.25 mg (08/10/2018), 0.25  mg (08/24/2018), 0.25 mg (09/07/2018), 0.25 mg (09/21/2018), 0.25 mg (10/12/2018), 0.25 mg (10/26/2018), 0.25 mg (11/09/2018), 0.25 mg (11/23/2018), 0.25 mg (12/07/2018), 0.25 mg (12/28/2018), 0.25 mg (01/18/2019), 0.25 mg (02/08/2019), 0.25 mg (03/22/2019), 0.25 mg (03/01/2019), 0.25 mg (04/12/2019), 0.25 mg (05/03/2019), 0.25 mg (05/17/2019), 0.25 mg (05/31/2019), 0.25 mg  (06/14/2019), 0.25 mg (06/28/2019), 0.25 mg (07/19/2019), 0.25 mg (08/02/2019) pegfilgrastim-cbqv (UDENYCA) injection 6 mg, 6 mg, Subcutaneous, Once, 19 of 19 cycles Administration: 6 mg (07/29/2018), 6 mg (08/26/2018), 6 mg (09/09/2018), 6 mg (09/23/2018), 6 mg (10/14/2018), 6 mg (10/28/2018), 6 mg (11/11/2018), 6 mg (11/25/2018), 6 mg (12/09/2018), 6 mg (12/30/2018), 6 mg (03/03/2019), 6 mg (05/05/2019), 6 mg (05/19/2019), 6 mg (06/02/2019), 6 mg (06/16/2019), 6 mg (06/30/2019), 6 mg (07/21/2019), 6 mg (08/04/2019) bevacizumab (AVASTIN) 450 mg in sodium chloride 0.9 % 100 mL chemo infusion, 5 mg/kg = 450 mg, Intravenous,  Once, 21 of 21 cycles Administration: 450 mg (07/06/2018), 450 mg (07/27/2018), 450 mg (08/10/2018), 450 mg (08/24/2018), 450 mg (09/07/2018), 450 mg (09/21/2018), 450 mg (10/12/2018), 450 mg (10/26/2018), 450 mg (11/09/2018), 450 mg (11/23/2018), 450 mg (12/07/2018), 400 mg (12/28/2018), 400 mg (02/08/2019), 400 mg (03/22/2019), 400 mg (03/01/2019), 400 mg (04/12/2019), 400 mg (05/03/2019), 400 mg (05/17/2019), 400 mg (05/31/2019), 400 mg (06/14/2019) leucovorin 800 mg in dextrose 5 % 250 mL infusion, 808 mg, Intravenous,  Once, 12 of 12 cycles Administration: 800 mg (06/22/2018), 800 mg (07/06/2018), 800 mg (07/27/2018), 800 mg (08/10/2018), 800 mg (08/24/2018), 800 mg (09/07/2018), 800 mg (09/21/2018), 800 mg (10/12/2018), 800 mg (10/26/2018), 800 mg (11/09/2018), 800 mg (11/23/2018), 800 mg (12/07/2018) oxaliplatin (ELOXATIN) 170 mg in dextrose 5 % 500 mL chemo infusion, 85 mg/m2 = 170 mg, Intravenous,  Once, 12 of 12 cycles Dose modification: 65 mg/m2 (original dose 85 mg/m2, Cycle 6, Reason: Other (see comments), Comment: thrombocytopenia) Administration: 170 mg (06/22/2018), 170 mg (07/06/2018), 170 mg (07/27/2018), 170 mg (08/10/2018), 170 mg (08/24/2018), 130 mg (09/07/2018), 130 mg (09/21/2018), 130 mg (10/12/2018), 130 mg (10/26/2018), 130 mg (11/09/2018), 130 mg (11/23/2018), 130 mg (12/07/2018) fluorouracil (ADRUCIL) chemo  injection 800 mg, 400 mg/m2 = 800 mg, Intravenous,  Once, 25 of 25 cycles Administration: 800 mg (06/22/2018), 800 mg (07/06/2018), 800 mg (07/27/2018), 800 mg (08/10/2018), 800 mg (08/24/2018), 800 mg (09/07/2018), 800 mg (09/21/2018), 800 mg (10/12/2018), 800 mg (10/26/2018), 800 mg (11/09/2018), 800 mg (11/23/2018), 800 mg (12/07/2018), 800 mg (12/28/2018), 800 mg (01/18/2019), 800 mg (02/08/2019), 800 mg (03/22/2019), 800 mg (03/01/2019), 800 mg (04/12/2019), 800 mg (05/03/2019), 800 mg (05/17/2019), 800 mg (05/31/2019), 800 mg (06/14/2019), 800 mg (06/28/2019), 800 mg (07/19/2019), 800 mg (08/02/2019) leucovorin injection 40 mg, 20 mg/m2 = 40 mg (100 % of original dose 20 mg/m2), Intravenous,  Once, 13 of 13 cycles Dose modification: 20 mg/m2 (original dose 20 mg/m2, Cycle 13, Reason: Other (see comments), Comment: iv push) Administration: 40 mg (12/28/2018), 40 mg (01/18/2019), 40 mg (02/08/2019), 40 mg (03/22/2019), 40 mg (03/01/2019), 40 mg (04/12/2019), 40 mg (05/03/2019), 40 mg (05/17/2019), 40 mg (05/31/2019), 40 mg (06/14/2019), 40 mg (06/28/2019), 40 mg (07/19/2019), 40 mg (08/02/2019) fluorouracil (ADRUCIL) 4,850 mg in sodium chloride 0.9 % 53 mL chemo infusion, 2,400 mg/m2 = 4,850 mg, Intravenous, 1 Day/Dose, 25 of 25 cycles Administration: 4,850 mg (06/22/2018), 4,850 mg (07/06/2018), 4,850 mg (07/27/2018), 4,850 mg (08/10/2018), 4,850 mg (08/24/2018), 4,850 mg (09/07/2018), 4,850 mg (09/21/2018), 4,850 mg (10/12/2018), 4,850 mg (10/26/2018), 4,850 mg (11/09/2018), 4,850 mg (11/23/2018), 4,850 mg (12/07/2018), 4,850 mg (12/28/2018), 4,850 mg (01/18/2019), 4,850 mg (02/08/2019), 4,850 mg (03/22/2019), 4,850  mg (03/01/2019), 4,850 mg (04/12/2019), 4,850 mg (05/03/2019), 4,850 mg (05/17/2019), 4,850 mg (05/31/2019), 4,850 mg (06/14/2019), 4,850 mg (06/28/2019), 4,850 mg (07/19/2019), 4,850 mg (08/02/2019) bevacizumab-awwb (MVASI) 400 mg in sodium chloride 0.9 % 100 mL chemo infusion, 450 mg (100 % of original dose 5 mg/kg), Intravenous,  Once, 3 of 3  cycles Dose modification: 5 mg/kg (original dose 5 mg/kg, Cycle 23, Reason: Other (see comments), Comment: biosimilar) Administration: 400 mg (06/28/2019), 400 mg (07/19/2019), 400 mg (08/02/2019)   for chemotherapy treatment.     08/16/2019 -  Chemotherapy    Patient is on Treatment Plan: COLORECTAL FOLFIRI / BEVACIZUMAB Q14D       Lung metastases ([place])  06/18/2018 Initial Diagnosis   Lung metastases ([place])   06/22/2018 - 08/04/2019 Chemotherapy   The patient had dexamethasone (DECADRON) 4 MG tablet, 8 mg, Oral, Daily, 1 of 1 cycle, Start date: 06/18/2018, End date: 06/22/2018 palonosetron (ALOXI) injection 0.25 mg, 0.25 mg, Intravenous,  Once, 25 of 25 cycles Administration: 0.25 mg (06/22/2018), 0.25 mg (07/06/2018), 0.25 mg (07/27/2018), 0.25 mg (08/10/2018), 0.25 mg (08/24/2018), 0.25 mg (09/07/2018), 0.25 mg (09/21/2018), 0.25 mg (10/12/2018), 0.25 mg (10/26/2018), 0.25 mg (11/09/2018), 0.25 mg (11/23/2018), 0.25 mg (12/07/2018), 0.25 mg (12/28/2018), 0.25 mg (01/18/2019), 0.25 mg (02/08/2019), 0.25 mg (03/22/2019), 0.25 mg (03/01/2019), 0.25 mg (04/12/2019), 0.25 mg (05/03/2019), 0.25 mg (05/17/2019), 0.25 mg (05/31/2019), 0.25 mg (06/14/2019), 0.25 mg (06/28/2019), 0.25 mg (07/19/2019), 0.25 mg (08/02/2019) pegfilgrastim-cbqv (UDENYCA) injection 6 mg, 6 mg, Subcutaneous, Once, 19 of 19 cycles Administration: 6 mg (07/29/2018), 6 mg (08/26/2018), 6 mg (09/09/2018), 6 mg (09/23/2018), 6 mg (10/14/2018), 6 mg (10/28/2018), 6 mg (11/11/2018), 6 mg (11/25/2018), 6 mg (12/09/2018), 6 mg (12/30/2018), 6 mg (03/03/2019), 6 mg (05/05/2019), 6 mg (05/19/2019), 6 mg (06/02/2019), 6 mg (06/16/2019), 6 mg (06/30/2019), 6 mg (07/21/2019), 6 mg (08/04/2019) bevacizumab (AVASTIN) 450 mg in sodium chloride 0.9 % 100 mL chemo infusion, 5 mg/kg = 450 mg, Intravenous,  Once, 21 of 21 cycles Administration: 450 mg (07/06/2018), 450 mg (07/27/2018), 450 mg (08/10/2018), 450 mg (08/24/2018), 450 mg (09/07/2018), 450 mg (09/21/2018), 450 mg (10/12/2018), 450 mg  (10/26/2018), 450 mg (11/09/2018), 450 mg (11/23/2018), 450 mg (12/07/2018), 400 mg (12/28/2018), 400 mg (02/08/2019), 400 mg (03/22/2019), 400 mg (03/01/2019), 400 mg (04/12/2019), 400 mg (05/03/2019), 400 mg (05/17/2019), 400 mg (05/31/2019), 400 mg (06/14/2019) leucovorin 800 mg in dextrose 5 % 250 mL infusion, 808 mg, Intravenous,  Once, 12 of 12 cycles Administration: 800 mg (06/22/2018), 800 mg (07/06/2018), 800 mg (07/27/2018), 800 mg (08/10/2018), 800 mg (08/24/2018), 800 mg (09/07/2018), 800 mg (09/21/2018), 800 mg (10/12/2018), 800 mg (10/26/2018), 800 mg (11/09/2018), 800 mg (11/23/2018), 800 mg (12/07/2018) oxaliplatin (ELOXATIN) 170 mg in dextrose 5 % 500 mL chemo infusion, 85 mg/m2 = 170 mg, Intravenous,  Once, 12 of 12 cycles Dose modification: 65 mg/m2 (original dose 85 mg/m2, Cycle 6, Reason: Other (see comments), Comment: thrombocytopenia) Administration: 170 mg (06/22/2018), 170 mg (07/06/2018), 170 mg (07/27/2018), 170 mg (08/10/2018), 170 mg (08/24/2018), 130 mg (09/07/2018), 130 mg (09/21/2018), 130 mg (10/12/2018), 130 mg (10/26/2018), 130 mg (11/09/2018), 130 mg (11/23/2018), 130 mg (12/07/2018) fluorouracil (ADRUCIL) chemo injection 800 mg, 400 mg/m2 = 800 mg, Intravenous,  Once, 25 of 25 cycles Administration: 800 mg (06/22/2018), 800 mg (07/06/2018), 800 mg (07/27/2018), 800 mg (08/10/2018), 800 mg (08/24/2018), 800 mg (09/07/2018), 800 mg (09/21/2018), 800 mg (10/12/2018), 800 mg (10/26/2018), 800 mg (11/09/2018), 800 mg (11/23/2018), 800 mg (12/07/2018), 800 mg (12/28/2018), 800 mg (01/18/2019), 800 mg (02/08/2019), 800 mg (  03/22/2019), 800 mg (03/01/2019), 800 mg (04/12/2019), 800 mg (05/03/2019), 800 mg (05/17/2019), 800 mg (05/31/2019), 800 mg (06/14/2019), 800 mg (06/28/2019), 800 mg (07/19/2019), 800 mg (08/02/2019) leucovorin injection 40 mg, 20 mg/m2 = 40 mg (100 % of original dose 20 mg/m2), Intravenous,  Once, 13 of 13 cycles Dose modification: 20 mg/m2 (original dose 20 mg/m2, Cycle 13, Reason: Other (see comments),  Comment: iv push) Administration: 40 mg (12/28/2018), 40 mg (01/18/2019), 40 mg (02/08/2019), 40 mg (03/22/2019), 40 mg (03/01/2019), 40 mg (04/12/2019), 40 mg (05/03/2019), 40 mg (05/17/2019), 40 mg (05/31/2019), 40 mg (06/14/2019), 40 mg (06/28/2019), 40 mg (07/19/2019), 40 mg (08/02/2019) fluorouracil (ADRUCIL) 4,850 mg in sodium chloride 0.9 % 53 mL chemo infusion, 2,400 mg/m2 = 4,850 mg, Intravenous, 1 Day/Dose, 25 of 25 cycles Administration: 4,850 mg (06/22/2018), 4,850 mg (07/06/2018), 4,850 mg (07/27/2018), 4,850 mg (08/10/2018), 4,850 mg (08/24/2018), 4,850 mg (09/07/2018), 4,850 mg (09/21/2018), 4,850 mg (10/12/2018), 4,850 mg (10/26/2018), 4,850 mg (11/09/2018), 4,850 mg (11/23/2018), 4,850 mg (12/07/2018), 4,850 mg (12/28/2018), 4,850 mg (01/18/2019), 4,850 mg (02/08/2019), 4,850 mg (03/22/2019), 4,850 mg (03/01/2019), 4,850 mg (04/12/2019), 4,850 mg (05/03/2019), 4,850 mg (05/17/2019), 4,850 mg (05/31/2019), 4,850 mg (06/14/2019), 4,850 mg (06/28/2019), 4,850 mg (07/19/2019), 4,850 mg (08/02/2019) bevacizumab-awwb (MVASI) 400 mg in sodium chloride 0.9 % 100 mL chemo infusion, 450 mg (100 % of original dose 5 mg/kg), Intravenous,  Once, 3 of 3 cycles Dose modification: 5 mg/kg (original dose 5 mg/kg, Cycle 23, Reason: Other (see comments), Comment: biosimilar) Administration: 400 mg (06/28/2019), 400 mg (07/19/2019), 400 mg (08/02/2019)   for chemotherapy treatment.     08/16/2019 -  Chemotherapy    Patient is on Treatment Plan: COLORECTAL FOLFIRI / BEVACIZUMAB Q14D         ALLERGIES:  has No Known Allergies.  MEDICATIONS:  Current Facility-Administered Medications  Medication Dose Route Frequency Provider Last Rate Last Admin   acetaminophen (TYLENOL) tablet 650 mg  650 mg Oral Q6H PRN Athena Masse, MD       Or   acetaminophen (TYLENOL) suppository 650 mg  650 mg Rectal Q6H PRN Athena Masse, MD       albumin human 25 % solution 12.5 g  12.5 g Intravenous Daily Loletha Grayer, MD 60 mL/hr at 07/02/21 1002 12.5 g  at 07/02/21 1002   aspirin EC tablet 81 mg  81 mg Oral Daily Edwin Dada, MD   81 mg at 07/02/21 1856   benzonatate (TESSALON) capsule 200 mg  200 mg Oral TID Loletha Grayer, MD   200 mg at 07/02/21 3149   Chlorhexidine Gluconate Cloth 2 % PADS 6 each  6 each Topical Daily Athena Masse, MD   6 each at 07/01/21 1151   [START ON 07/03/2021] furosemide (LASIX) tablet 20 mg  20 mg Oral Daily Wieting, Delfino Lovett, MD       guaiFENesin-dextromethorphan [facility] DM) 100-10 MG/5ML syrup 5 mL  5 mL Oral Q4H PRN Athena Masse, MD   5 mL at 06/30/21 2144   insulin aspart (novoLOG) injection 0-15 Units  0-15 Units Subcutaneous Q4H Athena Masse, MD   8 Units at 07/02/21 1244   insulin aspart (novoLOG) injection 3 Units  3 Units Subcutaneous Q4H Oswald Hillock, RPH   3 Units at 07/02/21 1245   insulin glargine-yfgn (SEMGLEE) injection 10 Units  10 Units Subcutaneous BID Oswald Hillock, RPH   10 Units at 07/02/21 1007   loperamide (IMODIUM) capsule 2 mg  2 mg Oral PRN Gevena Barre  K, MD       loratadine (CLARITIN) tablet 10 mg  10 mg Oral Daily Judd Gaudier V, MD   10 mg at 07/02/21 0905   losartan (COZAAR) tablet 25 mg  25 mg Oral Daily Athena Masse, MD   25 mg at 07/02/21 0905   ondansetron (ZOFRAN) tablet 4 mg  4 mg Oral Q6H PRN Athena Masse, MD       Or   ondansetron [facility]) injection 4 mg  4 mg Intravenous Q6H PRN Athena Masse, MD   4 mg at 06/30/21 0545   pregabalin (LYRICA) capsule 75 mg  75 mg Oral BID Athena Masse, MD   75 mg at 07/02/21 4481   sodium chloride flush (NS) 0.9 % injection 10-40 mL  10-40 mL Intracatheter Q12H Loletha Grayer, MD   10 mL at 07/02/21 0908   sodium chloride flush (NS) 0.9 % injection 10-40 mL  10-40 mL Intracatheter PRN Loletha Grayer, MD       TPN ADULT (ION)   Intravenous Continuous TPN Wynelle Cleveland, RPH 83 mL/hr at 07/02/21 0254 Infusion Verify at 07/02/21 0254   TPN ADULT (ION)   Intravenous Continuous TPN Wynelle Cleveland, [place]       Facility-Administered Medications Ordered in Other Encounters  Medication Dose Route Frequency Provider Last Rate Last Admin   0.9 %  sodium chloride infusion   Intravenous Continuous Sindy Guadeloupe, MD   Stopped at 07/27/18 1037   heparin lock flush 100 unit/mL  500 Units Intravenous Once Sindy Guadeloupe, MD       sodium chloride flush (NS) 0.9 % injection 10 mL  10 mL Intravenous PRN Sindy Guadeloupe, MD   10 mL at 07/27/18 0920   sodium chloride flush (NS) 0.9 % injection 10 mL  10 mL Intravenous Once Sindy Guadeloupe, MD        VITAL SIGNS: BP (!) 123/56 (BP Location: Left Arm)   Pulse 91   Temp 97.9 F (36.6 C) (Oral)   Resp 20   Ht 5' 5.98" (1.676 m)   Wt 153 lb 3.5 oz (69.5 kg)   SpO2 100%   BMI 24.74 kg/m  Filed Weights   06/30/21 0500 07/01/21 0346 07/02/21 0446  Weight: 161 lb 9.6 oz (73.3 kg) 151 lb 7.3 oz (68.7 kg) 153 lb 3.5 oz (69.5 kg)    Estimated body mass index is 24.74 kg/m as calculated from the following:   Height as of this encounter: 5' 5.98" (1.676 m).   Weight as of this encounter: 153 lb 3.5 oz (69.5 kg).  LABS: CBC:    Component Value Date/Time   WBC 8.4 07/01/2021 0546   HGB 9.0 (L) 07/01/2021 0546   HCT 29.1 (L) 07/01/2021 0546   PLT 308 07/01/2021 0546   MCV 87.1 07/01/2021 0546   NEUTROABS 5.0 07/01/2021 0546   LYMPHSABS 1.7 07/01/2021 0546   MONOABS 0.9 07/01/2021 0546   EOSABS 0.7 (H) 07/01/2021 0546   BASOSABS 0.1 07/01/2021 0546   Comprehensive Metabolic Panel:    Component Value Date/Time   NA 131 (L) 07/02/2021 0500   K 4.6 07/02/2021 0500   CL 100 07/02/2021 0500   CO2 20 (L) 07/02/2021 0500   BUN 46 (H) 07/02/2021 0500   CREATININE 0.88 07/02/2021 0500   GLUCOSE 256 (H) 07/02/2021 0500   CALCIUM 9.5 07/02/2021 0500   AST 25 07/01/2021 0546   ALT 25 07/01/2021 0546   ALKPHOS 175 (  H) 07/01/2021 0546   BILITOT 0.5 07/01/2021 0546   PROT 7.1 07/01/2021 0546   ALBUMIN 2.4 (L) 07/01/2021 0546    RADIOGRAPHIC  STUDIES: DG Chest 2 View  Result Date: 06/30/2021 CLINICAL DATA:  Cough for 2 months. EXAM: CHEST - 2 VIEW COMPARISON:  CT of the chest on 06/12/2021, CT of the chest, abdomen, and pelvis on 03/13/2021 FINDINGS: Patient's LEFT-sided power port, tip overlying the level of the UPPER RIGHT atrium. RIGHT-sided PICC line tip overlies the superior vena cava. Heart is mildly enlarged. There are chronic Kerley B-lines in the lung bases, LEFT greater than RIGHT. Bilateral pleural effusions are present. RIGHT LOWER lobe mass measures 1.8 centimeters. Bibasilar atelectasis or infiltrates. IMPRESSION: Bilateral pleural effusions and bibasilar infiltrates. 1. Mild interstitial edema. 2. RIGHT LOWER lobe mass again noted. LEFT lingular mass is not as well seen as on recent CT. Electronically Signed   By: Nolon Nations M.D.   On: 06/30/2021 12:21   CT ABDOMEN PELVIS W CONTRAST  Result Date: 06/26/2021 CLINICAL DATA:  Ileostomy bag leakage EXAM: CT ABDOMEN AND PELVIS WITH CONTRAST TECHNIQUE: Multidetector CT imaging of the abdomen and pelvis was performed using the standard protocol following bolus administration of intravenous contrast. CONTRAST:  67mL OMNIPAQUE IOHEXOL 350 MG/ML SOLN COMPARISON:  CT 03/13/2021, 11/28/2020 FINDINGS: Lower chest: Lung bases demonstrate small bilateral pleural effusions. Atelectasis or minimal pneumonia at the bases. 16 x 14 mm right lower lobe pulmonary nodule, previously 14 x 13 mm. Irregular lingular pulmonary nodule measuring 13 x 10 mm, previously 16 x 12 mm. Normal cardiac size. Hepatobiliary: Status post cholecystectomy. No focal hepatic abnormality. No intra or extrahepatic biliary dilatation Pancreas: Unremarkable. No pancreatic ductal dilatation or surrounding inflammatory changes. Spleen: Normal in size without focal abnormality. Adrenals/Urinary Tract: Adrenal glands are normal. Kidneys show no hydronephrosis. The bladder is normal Stomach/Bowel: The stomach is nonenlarged.  Status post distal left colectomy with reanastomosis. Collapsed appearing colon. Contrast opacifies nondilated small bowel. Interval postsurgical changes of the small bowel. Large ventral wound containing contrast which appears to communicate with thickened small bowel deep to the wound, series 2, image 61. Vascular/Lymphatic: Moderate aortic atherosclerosis. No aneurysm. No suspicious nodes. Reproductive: Prostate calcifications without mass Other: No free air. Multiple mildly rim enhancing intra-abdominal fluid collections. 5 x 2.6 cm right mid abdominal collection, series 2, image 44. 2.4 x 1.8 cm fluid collection in the right mid lateral abdomen adjacent to small bowel, series 2, image 47 5.2 x 1.3 cm lenticular shaped collection within the left mid abdomen, coronal series 6, image 32. Multiple additional smaller rim enhancing collections within the abdomen concerning for abscess. Small rim enhancing perihepatic and perisplenic fluid collections. generalized soft tissue stranding within the mesentery which is new compared to prior. Musculoskeletal: No acute or significant osseous findings. IMPRESSION: 1. Large ventral wound containing small amount of contrast. Postsurgical changes of small bowel deep to the wound with enteral contrast from the small bowel extending into the large ventral wound suggesting enterocutaneous fistula. 2. Multiple intra-abdominal rim enhancing fluid collections as described above concerning for intra-abdominal abscess. Generalized soft tissue stranding throughout the mesentery, likely inflammatory. 3. Small bilateral pleural effusions with atelectasis or pneumonia at the bases. Metastatic lung nodules, slight increased size of right lower lobe metastatic nodule. Electronically Signed   By: Donavan Foil M.D.   On: 06/26/2021 19:03   DG Outside Films Chest  Result Date: 06/27/2021 This examination belongs to an outside facility and is stored here for comparison purposes only.  Contact the originating outside institution for any associated report or interpretation.  DG Outside Films Chest  Result Date: 06/27/2021 This examination belongs to an outside facility and is stored here for comparison purposes only.  Contact the originating outside institution for any associated report or interpretation.  DG Outside Films Chest  Result Date: 06/27/2021 This examination belongs to an outside facility and is stored here for comparison purposes only.  Contact the originating outside institution for any associated report or interpretation.   PERFORMANCE STATUS (ECOG) : 3 - Symptomatic, >50% confined to bed  Review of Systems Unable to complete  Physical Exam General: Frail appearing Pulmonary: Unlabored Abdomen: soft, nontender, ileostomy noted GU: no suprapubic tenderness Extremities: no edema, no joint deformities Skin: no rashes Neurological: Weakness, confusion  IMPRESSION: Patient noted to have mild confusion.  He thought he was at home and was surprised when I corrected him that he was currently at the hospital.  He does not recall his previous hospitalization.  Given his confusion, I did not feel that I could meaningfully have a conversation with him regarding his goals.  I called and spoke with patient's son, [name].  Son tells me about the significant decline that patient has had over the past several months following his hospitalization at [facility].  Son seems to recognize the potential for further decline.  However, family seem committed to the current scope of treatment and would like to give patient a chance to see if he improves.  We talked about the possibility of future hospitalizations resulting in further decline to patient's quality of life.  We also talked about the future involvement of hospice should family opt to transition to more of a comfort focus.  Son recognizes that hospice would require discontinuation of TPN, which is not something that  family currently are ready to do.  Son did have some questions regarding oral intake at end-of-life.  Son says that he recognizes that the patient is not currently in a position where he could tolerate cancer treatment.  This can be reevaluated in the future should the patient's clinical status/performance status improve.  Current plan is for the patient to continue being followed at the [facility] after discharge from the hospital.   Son is concerned that patient will not have enough resources to provide care at home.  It appears that Education officer, museum is actively working on disposition.  We discussed CODE STATUS.  Son says that he is not ready for a "DNR".  However, he does not think that patient would want to be sustained long-term on a ventilator or receive futile measures at end-of-life.  PLAN: -Continue current scope of treatment -Full code  Case and plan discussed with Dr. [name]   Time Total: 60 minutes  Visit consisted of counseling and education dealing with the complex and emotionally intense issues of symptom management and palliative care in the setting of serious and potentially life-threatening illness.Greater than 50%  of this time was spent counseling and coordinating care related to the above assessment and plan.  Signed by: [name], PhD, NP-C

## 2021-07-02 NOTE — NC FL2 (Signed)
[place] LEVEL OF CARE SCREENING TOOL     IDENTIFICATION  Patient Name: Miguel Flores Birthdate: [birth year] Sex: male Admission Date (Current Location): 06/26/2021  [facility] and [place] Number:  Engineering geologist and Address:  [facility], [address] [place], [place] [address]      Provider Number: [id number]  Attending Physician Name and Address:  Loletha Grayer, MD  Relative Name and Phone Number:       Current Level of Care: Hospital Recommended Level of Care: [place] Prior Approval Number:    Date Approved/Denied:   PASRR Number: [id number]  Discharge Plan: SNF    Current Diagnoses: Patient Active Problem List   Diagnosis Date Noted   Cough    Pleural effusion    Enterocutaneous fistula    Anemia of chronic disease    Hyponatremia    Overweight (BMI 25.0-29.9) 06/28/2021   Malnutrition of moderate degree 06/27/2021   Post cholecystectomy intraabdominal abscess 06/26/2021   S/P cholecystectomy 06/26/2021   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   On total parenteral nutrition (TPN) 06/26/2021   Goals of care, counseling/discussion 12/20/2018   Lung metastases ([place]) 06/18/2018   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[place]) 10/20/2017   Pure hypercholesterolemia 08/14/2017   Vaccine counseling 03/09/2017   Essential hypertension 02/20/2017   Mixed hyperlipidemia 02/20/2017   Type 2 diabetes mellitus with hyperlipidemia ([place]) 02/20/2017   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 TPN.)  AMBULATORY STATUS COMMUNICATION OF NEEDS Skin   Limited Assist Verbally Other (Comment) (Excoriated.)                       Personal Care Assistance Level of Assistance              Functional Limitations Info  Sight, Hearing, Speech Sight Info: Adequate Hearing Info: Adequate Speech Info: Adequate    SPECIAL CARE FACTORS FREQUENCY  PT (By licensed PT)     PT Frequency: 5 x week              Contractures Contractures Info: Not present    Additional Factors Info  Code Status, Allergies Code Status Info: Full code Allergies Info: NKDA           Current Medications (07/02/2021):  This is the current hospital active medication list Current Facility-Administered Medications  Medication Dose Route Frequency Provider Last Rate Last Admin   acetaminophen (TYLENOL) tablet 650 mg  650 mg Oral Q6H PRN Athena Masse, MD       Or   acetaminophen (TYLENOL) suppository 650 mg  650 mg Rectal Q6H PRN Athena Masse, MD       albumin human 25 % solution 12.5 g  12.5 g Intravenous Daily Loletha Grayer, MD 60 mL/hr at 07/02/21 1002 12.5 g at 07/02/21 1002   aspirin EC tablet 81 mg  81 mg Oral Daily Edwin Dada, MD   81 mg at 07/02/21 0905   benzonatate (TESSALON) capsule 200 mg  200 mg Oral TID Loletha Grayer, MD   200 mg at 07/02/21 1583   Chlorhexidine Gluconate Cloth 2 % PADS 6 each  6 each Topical Daily Athena Masse, MD   6 each at 07/01/21 1151   [START ON 07/03/2021] furosemide (LASIX) tablet 20 mg  20 mg Oral Daily Wieting, Delfino Lovett, MD       guaiFENesin-dextromethorphan [facility] DM) 100-10 MG/5ML syrup 5 mL  5 mL Oral Q4H PRN Athena Masse, MD   5 mL at 06/30/21 2144   insulin aspart (novoLOG) injection 0-15 Units  0-15 Units Subcutaneous Q4H Athena Masse, MD   8 Units at 07/02/21 1244   insulin aspart (novoLOG) injection 3 Units  3 Units Subcutaneous Q4H Oswald Hillock, RPH   3 Units at  07/02/21 1245   insulin glargine-yfgn (SEMGLEE) injection 10 Units  10 Units Subcutaneous BID Oswald Hillock, RPH   10 Units at 07/02/21 1007   loperamide (IMODIUM) capsule 2 mg  2 mg Oral PRN Annita Brod, MD       loratadine (CLARITIN) tablet 10 mg  10 mg Oral Daily Judd Gaudier V, MD   10 mg at 07/02/21 0905   losartan (COZAAR) tablet 25 mg  25 mg Oral Daily Judd Gaudier V, MD   25 mg at 07/02/21 0905   ondansetron (ZOFRAN) tablet 4 mg  4 mg Oral Q6H PRN Athena Masse, MD       Or   ondansetron [facility]) injection 4 mg  4 mg Intravenous Q6H PRN Athena Masse, MD   4 mg at 06/30/21 0545   pravastatin (PRAVACHOL) tablet 20 mg  20 mg Oral Q supper Athena Masse, MD   20 mg at 07/01/21 1657   pregabalin (LYRICA) capsule 75 mg  75 mg Oral BID Athena Masse, MD   75 mg at 07/02/21 0940   sodium chloride flush (NS) 0.9 % injection 10-40 mL  10-40 mL Intracatheter Q12H Wieting, Richard, MD   10 mL at 07/02/21 0908   sodium chloride flush (NS) 0.9 % injection 10-40 mL  10-40 mL Intracatheter PRN Loletha Grayer, MD       TPN ADULT (ION)   Intravenous Continuous TPN Wynelle Cleveland, RPH 83 mL/hr at 07/02/21 0254 Infusion Verify at 07/02/21 0254   TPN ADULT (ION)   Intravenous Continuous TPN Wynelle Cleveland, [place]       Facility-Administered Medications Ordered in Other Encounters  Medication Dose Route Frequency Provider Last Rate Last Admin   0.9 %  sodium chloride infusion   Intravenous Continuous Sindy Guadeloupe, MD   Stopped at 07/27/18 1037   heparin lock flush 100 unit/mL  500 Units Intravenous Once Sindy Guadeloupe, MD       sodium chloride flush (NS) 0.9 % injection 10 mL  10 mL Intravenous PRN Sindy Guadeloupe, MD   10 mL at 07/27/18 0920   sodium chloride flush (NS) 0.9 % injection 10 mL  10 mL Intravenous Once Sindy Guadeloupe, MD         Discharge Medications: Please see discharge summary for a list of discharge medications.  Relevant Imaging Results:  Relevant Lab  Results:   Additional Information SS#: [id number]. COVID vaccines: Moderna 11/25/19, 12/23/19. Pfizer booster 11/22/20.  Candie Chroman, LCSW

## 2021-07-02 NOTE — TOC Progression Note (Signed)
Transition of Care [facility]) - Progression Note    Patient Details  Name: Miguel Flores MRN: [id number] Date of Birth: 02-12-1941  [facility]) CM/SW Contact  Beverly Sessions, RN Phone Number: 07/02/2021, 3:47 PM  Clinical Narrative:      Delsa Sale with [facility] checking to confirm that patient does not have revenue codes for [facility]  Wide Spread bed search initiated  Updated son Tim  Expected Discharge Plan:  (TBD) Barriers to Discharge: Continued Medical Work up  Expected Discharge Plan and Services Expected Discharge Plan:  (TBD)     Post Acute Care Choice:  (TBD) Living arrangements for the past 2 months: Single Family Home                                       Social Determinants of Health (SDOH) Interventions    Readmission Risk Interventions No flowsheet data found.

## 2021-07-02 NOTE — Progress Notes (Signed)
Patient ID: Miguel Flores, male   DOB: 1941-08-24, 80 y.o.   MRN: [id number] [facility] PROGRESS NOTE  Miguel Flores SNK:[id number] DOB: Nov 26, 1940 DOA: 06/26/2021 PCP: [name], MD  HPI/Subjective: Patient confused this morning.  He thought he was eating breakfast at home with family.  Patient reoriented that he is in the hospital.  At first he did not believe me.  Slight cough with a deep breath.  Objective: Vitals:   07/02/21 0240 07/02/21 0732  BP: (!) 109/56 (!) 123/56  Pulse: 92 91  Resp: 16 20  Temp: 97.6 F (36.4 C) 97.9 F (36.6 C)  SpO2: 99% 100%    Intake/Output Summary (Last 24 hours) at 07/02/2021 1423 Last data filed at 07/02/2021 0915 Gross per 24 hour  Intake 2787.46 ml  Output 1700 ml  Net 1087.46 ml   Filed Weights   06/30/21 0500 07/01/21 0346 07/02/21 0446  Weight: 73.3 kg 68.7 kg 69.5 kg    ROS: Review of Systems  Respiratory:  Negative for shortness of breath.   Cardiovascular:  Negative for chest pain.  Gastrointestinal:  Negative for abdominal pain, nausea and vomiting.  Exam: Physical Exam HENT:     Head: Normocephalic.     Mouth/Throat:     Pharynx: No oropharyngeal exudate.  Eyes:     General: Lids are normal.     Conjunctiva/sclera: Conjunctivae normal.  Cardiovascular:     Rate and Rhythm: Normal rate and regular rhythm.     Heart sounds: Normal heart sounds, S1 normal and S2 normal.  Pulmonary:     Breath sounds: Examination of the right-lower field reveals decreased breath sounds and rales. Examination of the left-lower field reveals decreased breath sounds and rales. Decreased breath sounds and rales present. No wheezing or rhonchi.  Abdominal:     Palpations: Abdomen is soft.     Tenderness: There is no abdominal tenderness.     Comments: Ostomy present  Musculoskeletal:     Right lower leg: No swelling.     Left lower leg: No swelling.  Skin:    General: Skin is warm.     Findings: No rash.  Neurological:      Mental Status: He is confused.      Scheduled Meds:  aspirin EC  81 mg Oral Daily   benzonatate  200 mg Oral TID   Chlorhexidine Gluconate Cloth  6 each Topical Daily   [START ON 07/03/2021] furosemide  20 mg Oral Daily   insulin aspart  0-15 Units Subcutaneous Q4H   insulin aspart  3 Units Subcutaneous Q4H   insulin glargine-yfgn  10 Units Subcutaneous BID   loratadine  10 mg Oral Daily   losartan  25 mg Oral Daily   pravastatin  20 mg Oral Q supper   pregabalin  75 mg Oral BID   sodium chloride flush  10-40 mL Intracatheter Q12H   Continuous Infusions:  albumin human 12.5 g (07/02/21 1002)   TPN ADULT (ION) 83 mL/hr at 07/02/21 0254   TPN ADULT (ION)     Brief history 80 year old male with 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  He did had evidence of metastatic lung disease but is was able to be controlled with chemotherapy.  He presented to [facility] 6 weeks ago for acute cholecystitis and underwent laparoscopic cholecystectomy on May 16, 2021.  His hospitalization was complicated by missed bowel injury requiring exploratory laparotomy and multiple interventions.  Eventually the  patient developed enterocutaneous fistula and was in the hospital for over 4 weeks.  Now he presents with issues regarding enterocutaneous fistula and the Eakin pouch leaking. I personally reviewed the CT scan and there is evidence of a small collections that given his primary pathology, general surgery will not attempt any percutaneous drainage.  This may lead to more complications given his EC fistula and friable bowel.   Patient admitted 06/26/2021 for problems with fistula and bag catching the stool.  Wound care has been able to get a bag that is doing the symptoms and catching his stool and only leaking intermittently.  The patient does have a cough and has a little interstitial edema and bilateral pleural effusions and  that improved a little bit with IV Lasix and IV albumin.  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 [name] as outpatient.  Assessment/Plan:  Acute metabolic encephalopathy.  Patient confused today.  Continue to monitor.  DC statin.  Get rid of as needed pain medication since he is not in pain.   Enterocutaneous fistula.  Multiple fluid collections on CT scan of the abdomen.  The patient has a bag on his fistula and is helping out with catching the stool.  Patient will need help learning how to apply this.  General surgery would like his nutritional status to be better and would recommend a tertiary care center for final operation in about 6 months to reverse this.  They recommend TPN to continue.  They do not recommend any intervention on the abdomen at this time. Cough.  Likely some interstitial edema.  Switch Lasix over to oral continue IV albumin.  Could also be secondary to metastatic lung cancer. 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  Follow-up with Dr. [name] as outpatient.  Hyponatremia.  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  Hold pravastatin with altered mental status.  Continue Lyrica for right now.  Patient on glargine insulin while on TPN        Code Status:     Code Status Orders  (From admission, onward)           Start     Ordered   06/26/21 2251  Full code  Continuous        06/26/21 2251           Code Status History     Date Active Date Inactive Code Status Order ID Comments User Context   10/20/2017 1248 10/24/2017 1504 Full Code [id number]  [name], MD Inpatient   02/05/2017 2245 02/11/2017 1950 Full Code [id number]  Olean Ree, MD ED      Advance Directive Documentation    Flowsheet Row Most Recent Value  Type of Advance Directive Healthcare Power of Attorney, Living will  Pre-existing out of  facility DNR order (yellow form or pink MOST form) --  "MOST" Form in Place? --      Family Communication: Spoke with son Miguel Flores on the phone Disposition Plan: Status is: Inpatient  Dispo: The patient is from: Home              Anticipated d/c is to: To be determined based on transitional care team trying to get another place for him.              Patient currently confused today which his mental status was good prior to today.  Hold statin and as needed pain  medication.  Continue to monitor.   Difficult to place patient.  Yes.  Time spent: 27 minutes  [place]

## 2021-07-02 NOTE — Consult Note (Signed)
[place] Nurse wound follow up Patient receiving care in [facility] 225. Patient more lethargic today, sleeping through much of the care provided. Wound type: Abdominal surgical wound with ED fistula at distal margin.  The 2 piece, 2 and 3/4 inch ostomy pouching system placed Friday 8/19 at 0910, leaked at some point during the night and staff changed it.  In the process, the ostomy belt was thrown away, and a new belt obtained and placed.  Unfortunately, the fistula effluent had already begun to undermine the skin barrier.  It appears only one barrier ring was used. I removed the existing skin barrier, cleansed with water only, then provided 2 layers of crusting with stoma powder and skin barrier film. Then opened a barrier ring into a "U" shape and placed it along the inferior border.  Then another barrier ring immediately below it in the same manner.  And, a barrier ring along the top of the wound margin. The skin barrier was placed over these rings, and the pouch applied.  The belt was secured in place.    I updated primary RN, Ramona, for the care provided. Val Riles, RN, MSN, CWOCN, CNS-BC, pager [phone]

## 2021-07-02 NOTE — Consult Note (Signed)
PHARMACY - TOTAL PARENTERAL NUTRITION CONSULT NOTE   Indication: Fistula  Patient Measurements: Height: 5' 5.98" (167.6 cm) Weight: 69.5 kg (153 lb 3.5 oz) IBW/kg (Calculated) : 63.76 TPN AdjBW (KG): 74.4 Body mass index is 24.74 kg/m.  Assessment:  Patient is an 80 y/o M with medical history including diabetes, HTN, colon cancer with lung metastases, recent history of admission at OSH for cholecystitis s/p cholecystectomy c/b post-op sepsis. Patient underwent ExLap with findings of small bowel injury which was repaired. 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 He is now presenting to the hospital for ileostomy leakage. Imaging notable for multiple intra-abdominal abscess with contrast extravasation to midline of abdominal wound. General surgery is following. Pharmacy consulted to continue home TPN while patient is admitted.  Glucose / Insulin: History of controlled diabetes (last Hgb A1c 6.9%).   on SSI q4h (moderate): 17 units/last 24 hrs Mealtime coverage 3 units q4hours (total mealtime 24 units/24 hours) Two doses of Lantus 10 units on 8/21 (total Lantus 20 units / 24 hours) Electrolytes: Hyponatremia (stable 131), Hyperkalemic (5.1 > 4.6) Renal: Scr < 1 Hepatic: LFTs within normal limits, except ALF trending up Intake / Output; MIVF: NS @ 10 ml/hr GI Imaging: 8/17 CT abdomen / pelvis: Large ventral wound containing small amount of contrast. Enterocutaneous fistula. Multiple intra-abdominal abscess(s). Mesenteric inflammation. Small bilateral pleural effusions with atelectasis or pneumonia at the bases. Metastatic lung nodules, slight increased size of right lower lobe metastatic nodule GI Surgeries / Procedures:  History of recent cholecystectomy, ExLap at OSH in 05/2021  Central access: POA TPN start date: 8/18   Outpatient TPN information: [facility] in [place], [place] Ph:  [phone]  Nutritional Goals: Goal TPN rate is 83 mL/hr (provides 105.6 g of protein and 2262 kcals per day)  RD Assessment: Estimated Needs Total Energy Estimated Needs: 2000-2300kcal/day Total Protein Estimated Needs: 100-115g/day Total Fluid Estimated Needs: 2.0-2.3L/day  Current Nutrition:  TPN  Plan:  Continue in-house TPN formulation at 83 mL/hr (2092 mL w/ overfill) Nutritional components: Amino acids (Travasol 10%): 105.6 grams Lipids (20% SMOF): 68.8 grams Dextrose: 338.6 grams Electrolytes in TPN (standard): Na 67mEq/L(increase Na from 55 meq/L to 60 meq/L on 8/21), K 25 mEq/L, Ca 5 mEq/L, Mg 74mEq/L, and Phos 46mmol/L. Cl:Ac 1:1 Add standard MVI and trace elements to TPN Continue Lantus 10 units BID + Novolog 3 units q4 hr + SSI. BG improved from 230-260 to 186-215.  Electrolytes: Na stable 130-131. Continue 60 mEq/L K+ decreased to 4.6. Continue at 25 mEq/L Monitor TPN labs on Mon/Thurs, daily until stable F/u labs am 8/24  Wynelle Cleveland, PharmD Pharmacy Resident  07/02/2021 6:57 AM

## 2021-07-03 DIAGNOSIS — K9413 Enterostomy malfunction: Secondary | ICD-10-CM | POA: Diagnosis not present

## 2021-07-03 LAB — URINALYSIS, ROUTINE W REFLEX MICROSCOPIC
Bilirubin Urine: NEGATIVE
Glucose, UA: NEGATIVE mg/dL
Hgb urine dipstick: NEGATIVE
Ketones, ur: NEGATIVE mg/dL
Leukocytes,Ua: NEGATIVE
Nitrite: NEGATIVE
Protein, ur: NEGATIVE mg/dL
Specific Gravity, Urine: 1.016 (ref 1.005–1.030)
pH: 5 (ref 5.0–8.0)

## 2021-07-03 LAB — GLUCOSE, CAPILLARY
Glucose-Capillary: 115 mg/dL — ABNORMAL HIGH (ref 70–99)
Glucose-Capillary: 269 mg/dL — ABNORMAL HIGH (ref 70–99)
Glucose-Capillary: 291 mg/dL — ABNORMAL HIGH (ref 70–99)
Glucose-Capillary: 303 mg/dL — ABNORMAL HIGH (ref 70–99)
Glucose-Capillary: 217 mg/dL — ABNORMAL HIGH (ref 70–99)
Glucose-Capillary: 294 mg/dL — ABNORMAL HIGH (ref 70–99)

## 2021-07-03 LAB — BASIC METABOLIC PANEL
Anion gap: 11 (ref 5–15)
BUN: 54 mg/dL — ABNORMAL HIGH (ref 8–23)
CO2: 23 mmol/L (ref 22–32)
Calcium: 10 mg/dL (ref 8.9–10.3)
Chloride: 99 mmol/L (ref 98–111)
Creatinine, Ser: 0.96 mg/dL (ref 0.61–1.24)
GFR, Estimated: >60 mL/min (ref >60)
Glucose, Bld: 286 mg/dL — ABNORMAL HIGH (ref 70–99)
Potassium: 4.8 mmol/L (ref 3.5–5.1)
Sodium: 133 mmol/L — ABNORMAL LOW (ref 135–145)

## 2021-07-03 MED ORDER — INSULIN ASPART 100 UNIT/ML IJ SOLN
4.0000 [IU] | INTRAMUSCULAR | Status: DC
  Administered 2021-07-03 – 2021-07-04 (×4): 4 [IU] via SUBCUTANEOUS
  Filled 2021-07-03 (×4): qty 1

## 2021-07-03 MED ORDER — INSULIN ASPART 100 UNIT/ML IJ SOLN
3.0000 [IU] | INTRAMUSCULAR | Status: DC
  Administered 2021-07-03: 3 [IU] via SUBCUTANEOUS
  Filled 2021-07-03: qty 1

## 2021-07-03 MED ORDER — ENSURE ENLIVE PO LIQD
237.0000 mL | Freq: Three times a day (TID) | ORAL | Status: DC
  Administered 2021-07-03 – 2021-07-04 (×2): 237 mL via ORAL
  Administered 2021-07-04: 1 via ORAL
  Administered 2021-07-04 – 2021-07-25 (×47): 237 mL via ORAL

## 2021-07-03 MED ORDER — INSULIN GLARGINE-YFGN 100 UNIT/ML ~~LOC~~ SOLN
10.0000 [IU] | Freq: Two times a day (BID) | SUBCUTANEOUS | Status: DC
  Filled 2021-07-03: qty 0.1

## 2021-07-03 MED ORDER — DEXAMETHASONE 4 MG PO TABS
4.0000 mg | ORAL_TABLET | Freq: Every day | ORAL | Status: DC
  Administered 2021-07-03 – 2021-07-20 (×17): 4 mg via ORAL
  Filled 2021-07-03 (×20): qty 1

## 2021-07-03 MED ORDER — INSULIN GLARGINE-YFGN 100 UNIT/ML ~~LOC~~ SOLN
13.0000 [IU] | Freq: Two times a day (BID) | SUBCUTANEOUS | Status: DC
  Administered 2021-07-03: 13 [IU] via SUBCUTANEOUS
  Filled 2021-07-03 (×2): qty 0.13

## 2021-07-03 MED ORDER — TRAVASOL 10 % IV SOLN
INTRAVENOUS | Status: AC
  Filled 2021-07-03: qty 1055.8

## 2021-07-03 MED ORDER — INSULIN GLARGINE-YFGN 100 UNIT/ML ~~LOC~~ SOLN: 8.0000 [IU] | Freq: Every day | SUBCUTANEOUS | Status: DC

## 2021-07-03 MED ORDER — MIRTAZAPINE 15 MG PO TABS
15.0000 mg | ORAL_TABLET | Freq: Every day | ORAL | Status: DC
  Administered 2021-07-03 – 2021-07-24 (×22): 15 mg via ORAL
  Filled 2021-07-03 (×22): qty 1

## 2021-07-03 NOTE — TOC Progression Note (Signed)
Transition of Care [facility]) - Progression Note    Patient Details  Name: Miguel Flores MRN: [id number] Date of Birth: 1941-09-18  Transition of [facility]) CM/SW Contact  Beverly Sessions, RN Phone Number: 07/03/2021, 11:50 AM  Clinical Narrative:     Confirmed with [facility] that patient does not meet criteria for LTACH  Had 4 bed offers, [facility], [facility] [place], and [facility]  After speaking with each of the admissions coordinators they are not able to accommodate TPN and rescinded bed offers  Updated son [name]:  Discussed options of patient returning home with home health and TPN.  Patient managing ostomy and sons assisting with TPN in the evening.  While in the hospital church members and family have been staying with patient during the day.  I inquired if it was an option for them to stay with the patient during the day at home. Son states that they will not be available every day  Per son they are working with an agency to private pay for caregivers and it will take 10-14 days for start of care. He is unable to provide me the name of the agency or start date. He is to get this information and call me back.  Sons expectation is that patient remains inpatient till start of care of [facility] services.  We did discuss that there would have to be a medical need for the patient to remain inpatient.  He is firm that his father will need to stay at the hospital until [facility] services are initiated.  He is aware that [facility] will not assist managing TPN or ostomy   Expected Discharge Plan:  (TBD) Barriers to Discharge: Continued Medical Work up  Expected Discharge Plan and Services Expected Discharge Plan:  (TBD)     Post Acute Care Choice:  (TBD) Living arrangements for the past 2 months: Single Family Home                                       Social Determinants of Health (SDOH) Interventions    Readmission Risk Interventions No flowsheet data found.

## 2021-07-03 NOTE — Progress Notes (Signed)
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  He did had evidence of metastatic lung disease but is was able to be controlled with chemotherapy.  He presented to [facility] 6 weeks ago for acute cholecystitis and underwent laparoscopic cholecystectomy on May 16, 2021.  His hospitalization was complicated by missed bowel injury requiring exploratory laparotomy and multiple interventions.  Eventually the patient developed enterocutaneous fistula and was in the hospital for over 4 weeks.  Now he presents with issues regarding enterocutaneous fistula and the Eakin pouch leaking. I personally reviewed the CT scan and there is evidence of a small collections that given his primary pathology, general surgery will not attempt any percutaneous drainage.  This may lead to more complications given his EC fistula and friable bowel.   Patient admitted 06/26/2021 for problems with fistula and bag catching the stool.  Wound care has been able to get a bag that is doing the symptoms and catching his stool and only leaking intermittently.  The patient does have a cough and has a little interstitial edema and bilateral pleural effusions and that improved a little bit with IV Lasix and IV albumin.  The patient does have metastatic colon cancer to lung and follows with Miguel Flores as outpatient.  Disposition and become very challenging.  The family is unable to provide the care that they feel the patient needs for home.  TPN is the major barrier to finding a accepting facility.  Per TOC she is reached out to 40+ facilities without an accepting one with the majority claiming that TPN is the barrier to admission.  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[place]) Active Problems:   Essential hypertension   Type 2 diabetes mellitus with hyperlipidemia ([place])   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[place])   Lung metastases ([place])   Post cholecystectomy intraabdominal abscess   S/P cholecystectomy   On total parenteral nutrition (TPN)   Malnutrition of moderate degree   Overweight (BMI 25.0-29.9)   Enterocutaneous fistula   Anemia of chronic disease   Hyponatremia   Cough   Pleural effusion   Acute metabolic encephalopathy   Palliative care encounter  Enterocutaneous fistula Multiple fluid collections on CT abdomen Extensive and complex surgical history Majority of care at outside facility in [facility] surgery consulted here They will facilitate outpatient evaluation and referral to tertiary care facility however will be 6 months before any intervention Currently no recommendations of intervention on the abdomen  Acute metabolic encephalopathy Baseline is unclear however concern the patient was confused.  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  At this point unclear to me whether patient would be  safe at home environment.  I spoke at length to his son Miguel Flores and the patient lives alone and prior to his hospitalization at [facility] was apparently robust and fully independent.  There is been a significant decline in his functional status and at this point0 I am not sure that home environment would be safe disposition and would likely result in readmission.  At this point TPN is the major barrier to skilled nursing facility placement so we will attempt to wean from TPN if the patient is able to meet some of his nutritional goals.    DVT prophylaxis: Lovenox full Code Status: Full Family Communication: Miguel Flores [phone] on 8/24 Disposition Plan: Status is: Inpatient  Remains inpatient appropriate because:Inpatient level of care appropriate due to severity of illness  Dispo: The patient is from: Home              Anticipated d/c is to:  TBD              Patient currently is not medically stable to DC   Difficult to place patient Yes       Level of care: Med-Surg  Consultants:  General surgery  Procedures:  None  Antimicrobials:  None   Subjective: Seen and examined.  Voice weak and appears frail but no apparent distress  Objective: Vitals:   07/02/21 1936 07/03/21 0415 07/03/21 0752 07/03/21 1315  BP: 121/64 (!) 114/47 139/67   Pulse: 96 91 91   Resp: 20 20 18    Temp: 98.1 F (36.7 C) 97.9 F (36.6 C) 97.9 F (36.6 C)   TempSrc: Oral Oral    SpO2: 100% 98% 98%   Weight:    71.6 kg  Height:        Intake/Output Summary (Last 24 hours) at 07/03/2021 1552 Last data filed at 07/03/2021 1438 Gross per 24 hour  Intake 1628.36 ml  Output 650 ml  Net 978.36 ml   Filed Weights   07/01/21 0346 07/02/21 0446 07/03/21 1315  Weight: 68.7 kg 69.5 kg 71.6 kg    Examination:  General exam: No acute distress.  Appears frail Respiratory system: Lungs clear.  Normal work of breathing.  Room air Cardiovascular system: S1-S2, regular rate and rhythm, no murmurs, no  pedal edema Gastrointestinal system: Thin, nontender, nondistended, normal bowel sounds Central nervous system: Alert, oriented x2, no focal deficits Extremities: Symmetrically decreased power bilaterally upper and lower extremities Skin: Pale with scattered excoriations Psychiatry: Judgement and insight appear impaired. Mood & affect flattened.     Data Reviewed: I have personally reviewed following labs and imaging studies  CBC: Recent Labs  Lab 06/26/21 1735 06/27/21 0513 07/01/21 0546  WBC 8.1 7.5 8.4  NEUTROABS 5.5  --  5.0  HGB 8.4* 8.8* 9.0*  HCT 28.6* 28.0* 29.1*  MCV 92.3 86.4 87.1  PLT 260 298 423   Basic Metabolic Panel: Recent Labs  Lab 06/27/21 0513 06/28/21 0445 06/29/21 0436 06/30/21 0611 07/01/21 0546 07/02/21 0500 07/03/21 0808  NA 134* 133* 130* 130* 130* 131* 133*  K 3.0* 3.8 4.5 4.7 5.1 4.6 4.8  CL 103 104 102 102 103 100 99  CO2 25 23 22 25 25  20* 23  GLUCOSE 197* 208* 221* 174* 227* 256* 286*  BUN 19 19 20  24* 33* 46* 54*  CREATININE 0.65 0.63 0.60* 0.64 0.80 0.88 0.96  CALCIUM  8.0* 8.2* 8.5* 8.9 9.1 9.5 10.0  MG 1.5* 1.8 1.8 1.9 1.9  --   --   PHOS 3.3 2.9 3.2 3.8 4.1  --   --    GFR: Estimated Creatinine Clearance: 54.5 mL/min (by C-G formula based on SCr of 0.96 mg/dL). Liver Function Tests: Recent Labs  Lab 06/26/21 1735 06/26/21 1907 06/28/21 0445 06/29/21 0436 07/01/21 0546  AST 24 26 27 30 25   ALT 18 20 18 23 25   ALKPHOS 132* 168* 157* 172* 175*  BILITOT 0.7 0.6 0.5 0.5 0.5  PROT 6.4* 7.5 6.7 6.7 7.1  ALBUMIN 2.0* 2.4* 2.1* 2.1* 2.4*   No results for input(s): LIPASE, AMYLASE in the last 168 hours. No results for input(s): AMMONIA in the last 168 hours. Coagulation Profile: No results for input(s): INR, PROTIME in the last 168 hours. Cardiac Enzymes: No results for input(s): CKTOTAL, CKMB, CKMBINDEX, TROPONINI in the last 168 hours. BNP (last 3 results) No results for input(s): PROBNP in the last 8760 hours. HbA1C: No  results for input(s): HGBA1C in the last 72 hours. CBG: Recent Labs  Lab 07/02/21 2344 07/03/21 0406 07/03/21 0800 07/03/21 1129 07/03/21 1313  GLUCAP 200* 115* 269* 291* 303*   Lipid Profile: Recent Labs    07/01/21 0546  TRIG 74   Thyroid Function Tests: No results for input(s): TSH, T4TOTAL, FREET4, T3FREE, THYROIDAB in the last 72 hours. Anemia Panel: No results for input(s): VITAMINB12, FOLATE, FERRITIN, TIBC, IRON, RETICCTPCT in the last 72 hours. Sepsis Labs: Recent Labs  Lab 06/26/21 1735 06/26/21 1907  LATICACIDVEN 1.2 1.8    Recent Results (from the past 240 hour(s))  Resp Panel by RT-PCR (Flu A&B, Covid) Nasopharyngeal Swab     Status: None   Collection Time: 06/26/21  7:31 PM   Specimen: Nasopharyngeal Swab; Nasopharyngeal(NP) swabs in vial transport medium  Result Value Ref Range Status   SARS Coronavirus 2 by RT PCR NEGATIVE NEGATIVE Final    Comment: (NOTE) SARS-CoV-2 target nucleic acids are NOT DETECTED.  The SARS-CoV-2 RNA is generally detectable in upper respiratory specimens during the acute phase of infection. The lowest concentration of SARS-CoV-2 viral copies this assay can detect is 138 copies/mL. A negative result does not preclude SARS-Cov-2 infection and should not be used as the sole basis for treatment or other patient management decisions. A negative result may occur with  improper specimen collection/handling, submission of specimen other than nasopharyngeal swab, presence of viral mutation(s) within the areas targeted by this assay, and inadequate number of viral copies(<138 copies/mL). A negative result must be combined with clinical observations, patient history, and epidemiological information. The expected result is Negative.  Fact Sheet for Patients:  EntrepreneurPulse.com.au  Fact Sheet for Healthcare Providers:  IncredibleEmployment.be  This test is no t yet approved or cleared by the  [place] FDA and  has been authorized for detection and/or diagnosis of SARS-CoV-2 by FDA under an Emergency Use Authorization (EUA). This EUA will remain  in effect (meaning this test can be used) for the duration of the COVID-19 declaration under Section 564(b)(1) of the Act, 21 U.S.C.section 360bbb-3(b)(1), unless the authorization is terminated  or revoked sooner.       Influenza A by PCR NEGATIVE NEGATIVE Final   Influenza B by PCR NEGATIVE NEGATIVE Final    Comment: (NOTE) The Xpert Xpress SARS-CoV-2/FLU/RSV plus assay is intended as an aid in the diagnosis of influenza from Nasopharyngeal swab specimens and should not be used as a sole basis for  treatment. Nasal washings and aspirates are unacceptable for Xpert Xpress SARS-CoV-2/FLU/RSV testing.  Fact Sheet for Patients: EntrepreneurPulse.com.au  Fact Sheet for Healthcare Providers: IncredibleEmployment.be  This test is not yet approved or cleared by the [place] FDA and has been authorized for detection and/or diagnosis of SARS-CoV-2 by FDA under an Emergency Use Authorization (EUA). This EUA will remain in effect (meaning this test can be used) for the duration of the COVID-19 declaration under Section 564(b)(1) of the Act, 21 U.S.C. section 360bbb-3(b)(1), unless the authorization is terminated or revoked.  Performed at [facility], [address] [place], [place] [address]          Radiology Studies: No results found.      Scheduled Meds:  aspirin EC  81 mg Oral Daily   benzonatate  200 mg Oral TID   Chlorhexidine Gluconate Cloth  6 each Topical Daily   feeding supplement  237 mL Oral TID BM   furosemide  20 mg Oral Daily   insulin aspart  0-15 Units Subcutaneous Q4H   insulin aspart  4 Units Subcutaneous Q4H   insulin glargine-yfgn  13 Units Subcutaneous BID   loratadine  10 mg Oral Daily   losartan  25 mg Oral Daily   pregabalin  75 mg  Oral BID   sodium chloride flush  10-40 mL Intracatheter Q12H   Continuous Infusions:  albumin human 12.5 g (07/03/21 0852)   TPN ADULT (ION) 83 mL/hr at 07/03/21 1052   TPN ADULT (ION)       LOS: 7 days    Time spent: 35 minutes    Sidney Ace, MD [facility] Pager [phone]  If 7PM-7AM, please contact night-coverage 07/03/2021, 3:52 PM

## 2021-07-03 NOTE — Progress Notes (Signed)
Np notified pt incontinent unable to get U/A, not necessary to do I/O cath at this time.

## 2021-07-03 NOTE — Progress Notes (Signed)
Physical Therapy Treatment Patient Details Name: Miguel Flores MRN: [id number] DOB: 14-Apr-1941 Today's Date: 07/03/2021    History of Present Illness 80 y.o. 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 [name] who presented with draining enterocutaneous fistula.     Patient has a complicated recent abdominal surgery history (see Dr. Deniece Ree note from 8/17) that began 6 weeks ago with cholecystitis at a hospital in [facility].  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  He ultimately went home, initially doing well and then started having issues with the ileostomy.    PT Comments    Pt sleeping in bed upon PT arrival; woken with gentle vc's and tactile cues.  SBA semi-supine to sitting edge of bed (pt's gown and sheets noted to be wet with urine so therapist changed pt's gown and NT notified of need to change pt's linens and pt needing sponge bath); CGA to stand up to RW with extra effort and time; and CGA to min assist to ambulate 50 feet with RW (limited distance d/t pt appearing shaky and overall weak).  Pt coughing intermittently during session.  Pt oriented to person but reported he was in [place] in a nursing home and it was September 17th 2022; mild confusion noted during session.  Will continue to focus on strengthening, balance, and progressive functional mobility per pt tolerance.  Pt appearing to be having increased difficulty with functional mobility today; therapy recommendations updated to SNF (TOC notified); will monitor pt's progress and update recommendations as appropriate.   Follow Up Recommendations  SNF     Equipment Recommendations  Rolling walker with 5" wheels;3in1 (PT)    Recommendations for Other Services       Precautions / Restrictions  Precautions Precautions: Fall Precaution Comments: ostomy (plus ostomy belt) and IV's; L chest port; R PICC Restrictions Weight Bearing Restrictions: No    Mobility  Bed Mobility Overal bed mobility: Needs Assistance Bed Mobility: Supine to Sit     Supine to sit: Supervision;HOB elevated     General bed mobility comments: via logrolling towards R side; vc's for technique; increased effort for pt to perform (relied on UE to sit up)    Transfers Overall transfer level: Needs assistance Equipment used: Rolling walker (2 wheeled) Transfers: Sit to/from Stand Sit to Stand: Min guard         General transfer comment: increased effort and time to stand up to RW (CGA for safety)  Ambulation/Gait Ambulation/Gait assistance: Min guard;Min assist Gait Distance (Feet): 50 Feet Assistive device: Rolling walker (2 wheeled)   Gait velocity: decreased   General Gait Details: partial step through gait pattern; shaky; requiring CGA to min assist for safety/balance   Stairs             Wheelchair Mobility    Modified Rankin (Stroke Patients Only)       Balance Overall balance assessment: Needs assistance Sitting-balance support: No upper extremity supported;Feet supported Sitting balance-Leahy Scale: Good Sitting balance - Comments: steady sitting reaching within BOS   Standing balance support: Single extremity supported Standing balance-Leahy Scale: Fair Standing balance comment: steady standing with single UE support  Cognition Arousal/Alertness: Awake/alert Behavior During Therapy: WFL for tasks assessed/performed Overall Cognitive Status: Impaired/Different from baseline Area of Impairment: Orientation                 Orientation Level: Disoriented to;Place;Time             General Comments: Pt reporting it was Sept 17th 2022 and that he was in [place] in a nursing home.      Exercises      General  Comments  Nursing cleared pt for participation in physical therapy.  Pt agreeable to PT session.  Pt's brother in law present during session.      Pertinent Vitals/Pain Pain Assessment: No/denies pain Vitals (HR and O2 on room air) stable and WFL throughout treatment session.    Home Living                      Prior Function            PT Goals (current goals can now be found in the care plan section) Acute Rehab PT Goals Patient Stated Goal: to improve strength and mobility PT Goal Formulation: With patient Time For Goal Achievement: 07/11/21 Potential to Achieve Goals: Fair Progress towards PT goals: Progressing toward goals    Frequency    Min 2X/week      PT Plan Discharge plan needs to be updated    Co-evaluation              AM-PAC PT "6 Clicks" Mobility   Outcome Measure  Help needed turning from your back to your side while in a flat bed without using bedrails?: None Help needed moving from lying on your back to sitting on the side of a flat bed without using bedrails?: A Little Help needed moving to and from a bed to a chair (including a wheelchair)?: A Little Help needed standing up from a chair using your arms (e.g., wheelchair or bedside chair)?: A Little Help needed to walk in hospital room?: A Little Help needed climbing 3-5 steps with a railing? : A Lot 6 Click Score: 18    End of Session Equipment Utilized During Treatment: Gait belt (above ostomy and below chest port) Activity Tolerance: Patient limited by fatigue Patient left: in chair;with call bell/phone within reach;with chair alarm set;with family/visitor present Nurse Communication: Mobility status;Precautions;Other (comment) (no leakage noted but top of ostomy apparatus noted to be coming away/off from skin--nurse notified) PT Visit Diagnosis: Muscle weakness (generalized) (M62.81);Difficulty in walking, not elsewhere classified (R26.2)     Time: 0737-1062 PT Time  Calculation (min) (ACUTE ONLY): 34 min  Charges:  $Therapeutic Activity: 23-37 mins                    Leitha Bleak, PT 07/03/21, 12:17 PM

## 2021-07-03 NOTE — Progress Notes (Signed)
Nutrition Follow-up  DOCUMENTATION CODES:  Non-severe (moderate) malnutrition in context of chronic illness  INTERVENTION:  Continue current diet as ordered, encourage PO intake TPN per pharmacy  Daily weights  Ensure Enlive po TID, each supplement provides 350 kcal and 20 grams of protein Magic cup TID with meals, each supplement provides 290 kcal and 9 grams of protein  NUTRITION DIAGNOSIS:  Moderate Malnutrition related to cancer and cancer related treatments as evidenced by moderate fat depletion, moderate muscle depletion, severe muscle depletion.  GOAL:  Patient will meet greater than or equal to 90% of their needs  MONITOR:  PO intake, Labs, Weight trends, Skin, I & O's (TPN)  REASON FOR ASSESSMENT:  Consult New TPN/TNA  ASSESSMENT:  80 year old male with history of DM, HTN, HLD, CLD III, metastatic sigmoid adenocarcinoma (s/p Hartmann's 01/2017, adjuvant Xeloda and on chemotherapy), small bilateral pleural effusions and known metastatic lung nodules, cholecystitis s/p cholecystectomy 5/0/09 complicated by bowel perforation requiring additional laparotomies and now on home TPN with EC fistula and Eakin pouch and intra-abdominal abscess.  Hx of illness: At outside hospital: pt underwent cholecystectomy 7/7. Afterwards became septic and underwent exploratory laparotomy and was found to have small bowel injury that was repaired and pt was left with open abdomen and the following day had reopening of the laparotomy without any new findings and the abdominal wall was closed. A couple of weeks later, pt was diagnosed with enterocutaneous fistula and was eventually discharged with Eakin Pouch and on TPN. Ileostomy in place  MD reports that pt is medically ready to be discharged but due to TPN facilities will not accept patient. Unable to go home alone due to lack of 24 hour caregivers to manage TPN and ostomy. MD requests that oral nutrition be assessed for feasibility of stopping TPN  and allowing for dc. Surgery plans for intervention to repair fistula in the future if pt is medically stable and well nourished.    Pt sleeping at the time of assessment. Brother-in-law present at bedside. Reviewed pt's lunch tray, peaches, pudding, and a few bites of soup consumed. Family reports breakfast tray was untouched when they exchanged it for lunch tray. Discussed intake with RN, reports intake at lunch is pretty typical for how he has been eating. RN does reports that pt has a bad cough which she thinks also plays a role in his intake. States that he will start coughing with exertion and has difficulty stopping.  Discussed intake with MD, with current intake, pt is not meeting nutrition needs. Will add nutrition supplements to meal trays and between meals to encourage PO intake and ingestion of adequate nutrition. Did re-introduce possibility of G tube to ensure that nutrition needs are being met, MD agrees with previous discussion between RD and attending that g tube is not an option at this time.  Average Meal Intake: 8/18-8/24: 31% intake x 9 recorded meals (0-60% intake of meals)  Nutritionally Relevant Medications: Scheduled Meds:  furosemide  20 mg Oral Daily   insulin aspart  0-15 Units Subcutaneous Q4H   insulin aspart  3 Units Subcutaneous Q4H   insulin glargine-yfgn  10 Units Subcutaneous BID   loratadine  10 mg Oral Daily   Continuous Infusions:  albumin human 12.5 g (07/03/21 0852)   TPN ADULT (ION) 83 mL/hr at 07/03/21 1052   PRN Meds: loperamide, ondansetron  Labs Reviewed: Na 133 BUN 54 Vitamin B12, 532 (8/19) SBG ranges from 115-291 mg/dL over the last 24 hours HgbA1c, 6.4 (8/17)  NUTRITION - FOCUSED PHYSICAL EXAM: Flowsheet Row Most Recent Value  Orbital Region Moderate depletion  Upper Arm Region Moderate depletion  Thoracic and Lumbar Region Moderate depletion  Buccal Region Mild depletion  Temple Region Moderate depletion  Clavicle Bone Region  Severe depletion  Clavicle and Acromion Bone Region Severe depletion  Scapular Bone Region Severe depletion  Dorsal Hand Moderate depletion  Patellar Region Moderate depletion  Anterior Thigh Region Moderate depletion  Posterior Calf Region Moderate depletion  Edema (RD Assessment) None  Hair Reviewed  Eyes Reviewed  Mouth Reviewed  Skin Reviewed  Nails Reviewed   Diet Order:   Diet Order             DIET DYS 3 Room service appropriate? Yes; Fluid consistency: Thin  Diet effective now                  EDUCATION NEEDS:  Education needs have been addressed  Skin:  Skin Assessment: Reviewed RN Assessment (midabdominal wound with EC fistula 3.5 x 2.5 x 1 cm)  Last BM:  8/24  Height:  Ht Readings from Last 1 Encounters:  06/27/21 5' 5.98" (1.676 m)    Weight:  Wt Readings from Last 1 Encounters:  07/03/21 71.6 kg    Ideal Body Weight:  64.5 kg  BMI:  Body mass index is 25.49 kg/m.  Estimated Nutritional Needs:  Kcal:  2000-2300kcal/day Protein:  100-115g/day Fluid:  2.0-2.3L/day   Ranell Patrick, RD, LDN Clinical Dietitian Pager on Amion

## 2021-07-03 NOTE — Plan of Care (Signed)

## 2021-07-03 NOTE — Progress Notes (Signed)
Found patient with leaking Ileostomy pouch; Brown water and flecks of feces noted, poring out beneath wafer.  Wafer removed and skin cleansed and wiped with barrier film and Tincture of Benzoin for extra stickiness. Ostomy barrier rings applied around ostomy.  Wafer template recut after remeasuring hole and wafer applied over rings. Skin very red and moist. Moisture barrier cream applied to suprapubic area below wafer.  Will continue to monitor effectiveness. Patient tolerated procedure well. Barbaraann Faster, RN 8:37 PM 07/03/2021

## 2021-07-03 NOTE — Consult Note (Signed)
PHARMACY - TOTAL PARENTERAL NUTRITION CONSULT NOTE   Indication: Fistula  Patient Measurements: Height: 5' 5.98" (167.6 cm) Weight: 69.5 kg (153 lb 3.5 oz) IBW/kg (Calculated) : 63.76 TPN AdjBW (KG): 74.4 Body mass index is 24.74 kg/m.  Assessment:  Patient is an 80 y/o M with medical history including diabetes, HTN, colon cancer with lung metastases, recent history of admission at OSH for cholecystitis s/p cholecystectomy c/b post-op sepsis. Patient underwent ExLap with findings of small bowel injury which was repaired. 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 He is now presenting to the hospital for ileostomy leakage. Imaging notable for multiple intra-abdominal abscess with contrast extravasation to midline of abdominal wound. General surgery is following. Pharmacy consulted to continue home TPN while patient is admitted.  Glucose / Insulin: History of controlled diabetes (last Hgb A1c 6.9%).   on SSI q4h (moderate): 18 units/last 24 hrs Novolog coverage 3 units q4hours (total Novolog 18 units/24 hours) Two doses of Lantus 10 units on 8/21 (total Lantus 20 units / 24 hours) Electrolytes: Hyponatremia (stable 133), Hyperkalemic (5.1 > 4.6> 4.8) Renal: Scr < 1 Hepatic: LFTs within normal limits, except ALF trending up Intake / Output; MIVF: NS @ 10 ml/hr GI Imaging: 8/17 CT abdomen / pelvis: Large ventral wound containing small amount of contrast. Enterocutaneous fistula. Multiple intra-abdominal abscess(s). Mesenteric inflammation. Small bilateral pleural effusions with atelectasis or pneumonia at the bases. Metastatic lung nodules, slight increased size of right lower lobe metastatic nodule GI Surgeries / Procedures:  History of recent cholecystectomy, ExLap at OSH in 05/2021  Central access: POA TPN start date: 8/18   Outpatient TPN information: [facility] in [place], [place] Ph:  [phone]  Nutritional Goals: Goal TPN rate is 83 mL/hr (provides 105.6 g of protein and 2262 kcals per day)  RD Assessment: Estimated Needs Total Energy Estimated Needs: 2000-2300kcal/day Total Protein Estimated Needs: 100-115g/day Total Fluid Estimated Needs: 2.0-2.3L/day  Current Nutrition:  TPN  Plan:  Continue in-house TPN formulation at 83 mL/hr (2092 mL w/ overfill) Nutritional components: Amino acids (Travasol 10%): 105.6 grams Lipids (20% SMOF): 68.8 grams Dextrose: 338.6 grams Electrolytes in TPN (standard): Na 75mEq/L(increase Na from 55 meq/L to 60 meq/L on 8/21), K 25 mEq/L, Ca 5 mEq/L, Mg 43mEq/L, and Phos 32mmol/L. Cl:Ac 1:1 Add standard MVI and trace elements to TPN Continue Lantus 10 units BID + Novolog 3 units q4 hr + SSI.  Electrolytes: Na stable 130-133. Continue 60 mEq/L K+ increased to 4.8. Continue at 25 mEq/L Monitor TPN labs on Mon/Thurs, daily until stable F/u labs am 8/24   Wynelle Cleveland, PharmD Pharmacy Resident  07/03/2021 8:32 AM

## 2021-07-04 DIAGNOSIS — K9413 Enterostomy malfunction: Secondary | ICD-10-CM | POA: Diagnosis not present

## 2021-07-04 LAB — COMPREHENSIVE METABOLIC PANEL
ALT: 35 U/L (ref 0–44)
AST: 35 U/L (ref 15–41)
Albumin: 3.4 g/dL — ABNORMAL LOW (ref 3.5–5.0)
Alkaline Phosphatase: 184 U/L — ABNORMAL HIGH (ref 38–126)
Anion gap: 9 (ref 5–15)
BUN: 60 mg/dL — ABNORMAL HIGH (ref 8–23)
CO2: 22 mmol/L (ref 22–32)
Calcium: 10 mg/dL (ref 8.9–10.3)
Chloride: 99 mmol/L (ref 98–111)
Creatinine, Ser: 0.89 mg/dL (ref 0.61–1.24)
GFR, Estimated: >60 mL/min (ref >60)
Glucose, Bld: 342 mg/dL — ABNORMAL HIGH (ref 70–99)
Potassium: 5.2 mmol/L — ABNORMAL HIGH (ref 3.5–5.1)
Sodium: 130 mmol/L — ABNORMAL LOW (ref 135–145)
Total Bilirubin: 0.6 mg/dL (ref 0.3–1.2)
Total Protein: 8.4 g/dL — ABNORMAL HIGH (ref 6.5–8.1)

## 2021-07-04 LAB — GLUCOSE, CAPILLARY
Glucose-Capillary: 293 mg/dL — ABNORMAL HIGH (ref 70–99)
Glucose-Capillary: 327 mg/dL — ABNORMAL HIGH (ref 70–99)
Glucose-Capillary: 342 mg/dL — ABNORMAL HIGH (ref 70–99)
Glucose-Capillary: 373 mg/dL — ABNORMAL HIGH (ref 70–99)
Glucose-Capillary: 376 mg/dL — ABNORMAL HIGH (ref 70–99)
Glucose-Capillary: 386 mg/dL — ABNORMAL HIGH (ref 70–99)

## 2021-07-04 LAB — MAGNESIUM: Magnesium: 2 mg/dL (ref 1.7–2.4)

## 2021-07-04 LAB — PHOSPHORUS: Phosphorus: 3 mg/dL (ref 2.5–4.6)

## 2021-07-04 MED ORDER — INSULIN GLARGINE-YFGN 100 UNIT/ML ~~LOC~~ SOLN
16.0000 [IU] | Freq: Two times a day (BID) | SUBCUTANEOUS | Status: DC
  Administered 2021-07-04: 16 [IU] via SUBCUTANEOUS
  Filled 2021-07-04 (×2): qty 0.16

## 2021-07-04 MED ORDER — INSULIN ASPART 100 UNIT/ML IJ SOLN
7.0000 [IU] | INTRAMUSCULAR | Status: DC
  Administered 2021-07-04 – 2021-07-08 (×19): 7 [IU] via SUBCUTANEOUS
  Filled 2021-07-04 (×17): qty 1

## 2021-07-04 MED ORDER — INSULIN GLARGINE-YFGN 100 UNIT/ML ~~LOC~~ SOLN
18.0000 [IU] | Freq: Two times a day (BID) | SUBCUTANEOUS | Status: DC
  Administered 2021-07-04: 18 [IU] via SUBCUTANEOUS
  Filled 2021-07-04 (×2): qty 0.18

## 2021-07-04 MED ORDER — TRAVASOL 10 % IV SOLN
INTRAVENOUS | Status: AC
  Filled 2021-07-04: qty 1055.8

## 2021-07-04 NOTE — Consult Note (Signed)
WOC Nurse Consult Note: Patient receiving care in [facility] 225. Primary RN, Charlynn Court, at bedside for instructional session on pouching fistula Despite every effort I have made, including wording in the patient care orders in a variety of manners, staff continue to throw away the ostomy belts.  This morning, the patient did not have an ostomy belt on, and there was none in the room.  I was told by staff they have been ordering at least one every day.  Today I showed Miguel Flores every step of the pouch removal, skin cleaning, and pouching system application and explained the rationale for what I was doing. I washed the skin WITH WATER ONLY. I applied 2 layers of stoma powder and skin barrier film to the reddened areas impacted by the effluent, allowing each layer to dry. I used two barrier rings (concentrically) at the inferior border of the wound, and one at the top. I cut the skin barrier opening at 1 3/4 inches, round. I placed the skin barrier over the barrier rings. I reinforced 2 borders of the skin barrier tape border with wide silk tape. I attached the pouch. I applied the ostomy belt at the snuggest width the belt would allow.  Using silk tape I wrote in black permanent marker on the tape 2 notices.  One that said not to throw the belt away, another that was labeled to wash if it becomes soiled. This information has been in the nursing care instruction order for days now, but staff are not following it.  I also spoke with the Unit Director, Ms. Moon, to make her aware of this situation.  The patient was confused this morning, whereas he was alert and oriented the first time I met him, and asking questions that really didn't seem to make any sense.  I told Miguel Flores that was a change since I first met the patient.  Supplies are in the room for future care, except for a spare belt.  I requested Material Management to provide another belt, and additional 2 3/4 inch skin barriers, which they have  agreed to do. Val Riles, RN, MSN, CWOCN, CNS-BC, pager [phone]

## 2021-07-04 NOTE — TOC Progression Note (Signed)
Transition of Care [facility]) - Progression Note    Patient Details  Name: Miguel Flores MRN: [id number] Date of Birth: [birth year]  Transition [facility]) CM/SW Contact  Beverly Sessions, RN Phone Number: 07/04/2021, 2:05 PM  Clinical Narrative:     Not SNF bed offers at this time Per MD goal would be to wean off TPN  If patient is weaned from TPN TOC would resend clinical information out for bed search  Son [name] updated    Expected Discharge Plan:  (TBD) Barriers to Discharge: Continued Medical Work up  Expected Discharge Plan and Services Expected Discharge Plan:  (TBD)     Post Acute Care Choice:  (TBD) Living arrangements for the past 2 months: Single Family Home                                       Social Determinants of Health (SDOH) Interventions    Readmission Risk Interventions No flowsheet data found.

## 2021-07-04 NOTE — Progress Notes (Addendum)
Calorie Count Note  48 hour calorie count ordered. Placed meal ticket on door and discussed instructions for completion with nursing staff.  Diet: DYS 3 Supplements: Ensure Enlive TID, Magic Cup TID  Breakfast: 203 kcal and 2g of protein from meal, 290kcal and 9g of protein from magic cup.  Total for breakfast: 493kcal, 11g of protein  Lunch: No documentation received  Dinner: 48 kcal and 6g of protein from meal, 145 kcal and 5g of protein from magic cup Total for dinner: 193 kcal, 11g of protein  Supplements: Ensure Plus x 3.5,  Total for supplements: 1225 kcal and 66g of protein   Estimated Nutritional Needs:  Kcal:  2000-2300kcal/day Protein:  100-115g/day Fluid:  2.0-2.3L/day  Total intake: 1911 kcal (95% of minimum estimated needs)  88 protein (88% of minimum estimated needs)  Pt has good intake of supplements, but intake of breakfast was minimal. Lunch not documented. Minimal intake of dinner as well. Based on intake pt will likely be able to meet kcal needs if he is routinely consuming high calorie nutrition supplements, protein intake may be a barrier to meeting needs.  Talked with NT about intake, states that pt really likes the ensures and is routinely consuming 100% of them.   Nutrition Dx: Moderate Malnutrition related to cancer and cancer related treatments as evidenced by moderate fat depletion, moderate muscle depletion, severe muscle depletion.  Goal: Patient will meet greater than or equal to 90% of their needs orally prior to discharge  Intervention:  Continue current diet as ordered, encourage PO intake TPN per pharmacy, wean if able Ensure Enlive po TID, each supplement provides 350 kcal and 20 grams of protein Magic cup TID with meals, each supplement provides 290 kcal and 9 grams of protein  Ranell Patrick, RD, LDN Clinical Dietitian Pager on [place]

## 2021-07-04 NOTE — Progress Notes (Signed)
OT Cancellation Note  Patient Details Name: Miguel Flores MRN: [id number] DOB: 02/03/1941   Cancelled Treatment:    Reason Eval/Treat Not Completed: Fatigue/lethargy limiting ability to participate. Consult received, chart reviewed. Pt endorses fatigue this afternoon but agreeable to OT re-attempting either later today or tomorrow.   Hanley Hays, MPH, MS, OTR/L ascom [phone] 07/04/21, 2:25 PM

## 2021-07-04 NOTE — TOC Progression Note (Signed)
Transition of Care [facility]) - Progression Note    Patient Details  Name: Miguel Flores MRN: [id number] Date of Birth: 07-23-1941  Transition of Care [facility]) CM/SW Contact  Beverly Sessions, RN Phone Number: 07/04/2021, 9:39 AM  Clinical Narrative:     PT now recommending SNF Still no bed offers that will manage TPN  Expected Discharge Plan:  (TBD) Barriers to Discharge: Continued Medical Work up  Expected Discharge Plan and Services Expected Discharge Plan:  (TBD)     Post Acute Care Choice:  (TBD) Living arrangements for the past 2 months: Single Family Home                                       Social Determinants of Health (SDOH) Interventions    Readmission Risk Interventions No flowsheet data found.

## 2021-07-04 NOTE — Progress Notes (Signed)
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  He did had evidence of metastatic lung disease but is was able to be controlled with chemotherapy.  He presented to [facility] 6 weeks ago for acute cholecystitis and underwent laparoscopic cholecystectomy on May 16, 2021.  His hospitalization was complicated by missed bowel injury requiring exploratory laparotomy and multiple interventions.  Eventually the patient developed enterocutaneous fistula and was in the hospital for over 4 weeks.  Now he presents with issues regarding enterocutaneous fistula and the [place] pouch leaking. I personally reviewed the CT scan and there is evidence of a small collections that given his primary pathology, general surgery will not attempt any percutaneous drainage.  This may lead to more complications given his EC fistula and friable bowel.   Patient admitted 06/26/2021 for problems with fistula and bag catching the stool.  Wound care has been able to get a bag that is doing the symptoms and catching his stool and only leaking intermittently.  The patient does have a cough and has a little interstitial edema and bilateral pleural effusions and that improved a little bit with IV Lasix and IV albumin.  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 [name] as outpatient.  Disposition and become very challenging.  The family is unable to provide the care that they feel the patient needs for home.  TPN is the major barrier to finding a accepting facility.  Per TOC she is reached out to 40+ facilities without an accepting one with the majority claiming that TPN is the barrier to admission.  I discussed this at length with patient and patient's son [name] on phone.  Current plan of  care is to attempt to wean off of TPN.  If we are able to do so and patient can meet the majority of his nutritional needs we can get him to a skilled nursing facility and he can seek surgical follow-up at tertiary care center.   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[place]) Active Problems:   Essential hypertension   Type 2 diabetes mellitus with hyperlipidemia (HCC)   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HCC)   Lung metastases (HCC)   Post cholecystectomy intraabdominal abscess   S/P cholecystectomy   On total parenteral nutrition (TPN)   Malnutrition of moderate degree   Overweight (BMI 25.0-29.9)   Enterocutaneous fistula   Anemia of chronic disease   Hyponatremia   Cough   Pleural effusion   Acute metabolic encephalopathy   Palliative care encounter  Enterocutaneous fistula Multiple fluid collections on CT abdomen Extensive and complex surgical history Majority of care at outside facility in [facility] surgery consulted here They will facilitate outpatient evaluation and referral to tertiary care facility however will be 6 months before any intervention Currently no recommendations of intervention on the abdomen W OC consulted for fistula and ostomy care  Acute metabolic encephalopathy Baseline is unclear however concern the patient was confused.  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 [name] as outpatient No active oncology issues however palliative care nurse practitioner [name] was consulted for goals of care At this time patient and family not interested in  de-escalation of care Outpatient follow-up  Hyponatremia Likely secondary to malnutrition and poor p.o. intake Currently on TPN, will continue  Moderate protein calorie malnutrition Currently on TPN.  Attempting to encourage p.o. intake.   Appetite stimulant started.  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  At this point unclear to me whether patient would be safe at home environment.  I spoke at length to his son [name] and the patient lives alone and prior to his hospitalization at [facility] was apparently robust and fully independent.  There is been a significant decline in his functional status and at this point0 I am not sure that home environment would be safe disposition and would likely result in readmission.  At this point TPN is the major barrier to skilled nursing facility placement so we will attempt to wean from TPN if the patient is able to meet some of his nutritional goals.  Patient has been trying to eat more and has been receptive to encouragement from nursing staff    DVT prophylaxis: SQ Lovenox Code Status: Full Family Communication: [name] [phone] on 8/24, 8/25 Disposition Plan: Status is: Inpatient  Remains inpatient appropriate because:Inpatient level of care appropriate due to severity of illness  Dispo: The patient is from: Home              Anticipated d/c is to:  TBD              Patient currently is not medically stable to DC   Difficult to place patient Yes  Patient is not medically stable for discharge.  His nutritional status is poor and we are attempting to wean from TPN in order to secure a safe disposition plan to skilled nursing facility.  At this point in my medical opinion I feel that disposition home is not safe.  He has a very complex surgical abdomen and an ostomy that frequently leaks and requires specific attention and care.     Level of care: Med-Surg  Consultants:  General surgery  Procedures:  None  Antimicrobials:   None   Subjective: Seen and examined.  No apparent distress  Objective: Vitals:   07/03/21 2038 07/04/21 0500 07/04/21 0514 07/04/21 0801  BP:   (!) 143/66 (!) 148/73  Pulse:   92 86  Resp: (!) 24  (!) 24 18  Temp:   (!) 97.4 F (36.3 C) (!) 97.4 F (36.3 C)  TempSrc:   Oral Oral  SpO2:   100% 99%  Weight:  68.5 kg    Height:        Intake/Output Summary (Last 24 hours) at 07/04/2021 1322 Last data filed at 07/04/2021 0152 Gross per 24 hour  Intake 860.79 ml  Output 400 ml  Net 460.79 ml   Filed Weights   07/02/21 0446 07/03/21 1315 07/04/21 0500  Weight: 69.5 kg 71.6 kg 68.5 kg    Examination:  General exam: No acute distress.  Appears frail Respiratory system: Lungs clear.  Normal work of breathing.  Room air Cardiovascular system: S1-S2, regular rate and rhythm, no murmurs, no pedal edema  gastrointestinal system: Thin, nontender, nondistended, normal bowel sounds Central nervous system: Awake, sleepy, oriented x2, no focal deficits Extremities: Symmetrically decreased power bilaterally upper and lower extremities Skin: Pale with scattered excoriations Psychiatry: Judgement and insight  appear impaired. Mood & affect flattened.     Data Reviewed: I have personally reviewed following labs and imaging studies  CBC: Recent Labs  Lab 07/01/21 0546  WBC 8.4  NEUTROABS 5.0  HGB 9.0*  HCT 29.1*  MCV 87.1  PLT 324   Basic Metabolic Panel: Recent Labs  Lab 06/28/21 0445 06/29/21 0436 06/30/21 0611 07/01/21 0546 07/02/21 0500 07/03/21 0808 07/04/21 0508  NA 133* 130* 130* 130* 131* 133* 130*  K 3.8 4.5 4.7 5.1 4.6 4.8 5.2*  CL 104 102 102 103 100 99 99  CO2 23 22 25 25  20* 23 22  GLUCOSE 208* 221* 174* 227* 256* 286* 342*  BUN 19 20 24* 33* 46* 54* 60*  CREATININE 0.63 0.60* 0.64 0.80 0.88 0.96 0.89  CALCIUM 8.2* 8.5* 8.9 9.1 9.5 10.0 10.0  MG 1.8 1.8 1.9 1.9  --   --  2.0  PHOS 2.9 3.2 3.8 4.1  --   --  3.0   GFR: Estimated Creatinine  Clearance: 58.7 mL/min (by C-G formula based on SCr of 0.89 mg/dL). Liver Function Tests: Recent Labs  Lab 06/28/21 0445 06/29/21 0436 07/01/21 0546 07/04/21 0508  AST 27 30 25  35  ALT 18 23 25  35  ALKPHOS 157* 172* 175* 184*  BILITOT 0.5 0.5 0.5 0.6  PROT 6.7 6.7 7.1 8.4*  ALBUMIN 2.1* 2.1* 2.4* 3.4*   No results for input(s): LIPASE, AMYLASE in the last 168 hours. No results for input(s): AMMONIA in the last 168 hours. Coagulation Profile: No results for input(s): INR, PROTIME in the last 168 hours. Cardiac Enzymes: No results for input(s): CKTOTAL, CKMB, CKMBINDEX, TROPONINI in the last 168 hours. BNP (last 3 results) No results for input(s): PROBNP in the last 8760 hours. HbA1C: No results for input(s): HGBA1C in the last 72 hours. CBG: Recent Labs  Lab 07/03/21 2046 07/04/21 0025 07/04/21 0450 07/04/21 0801 07/04/21 1151  GLUCAP 294* 376* 327* 293* 373*   Lipid Profile: No results for input(s): CHOL, HDL, LDLCALC, TRIG, CHOLHDL, LDLDIRECT in the last 72 hours.  Thyroid Function Tests: No results for input(s): TSH, T4TOTAL, FREET4, T3FREE, THYROIDAB in the last 72 hours. Anemia Panel: No results for input(s): VITAMINB12, FOLATE, FERRITIN, TIBC, IRON, RETICCTPCT in the last 72 hours. Sepsis Labs: No results for input(s): PROCALCITON, LATICACIDVEN in the last 168 hours.   Recent Results (from the past 240 hour(s))  Resp Panel by RT-PCR (Flu A&B, Covid) Nasopharyngeal Swab     Status: None   Collection Time: 06/26/21  7:31 PM   Specimen: Nasopharyngeal Swab; Nasopharyngeal(NP) swabs in vial transport medium  Result Value Ref Range Status   SARS Coronavirus 2 by RT PCR NEGATIVE NEGATIVE Final    Comment: (NOTE) SARS-CoV-2 target nucleic acids are NOT DETECTED.  The SARS-CoV-2 RNA is generally detectable in upper respiratory specimens during the acute phase of infection. The lowest concentration of SARS-CoV-2 viral copies this assay can detect is 138  copies/mL. A negative result does not preclude SARS-Cov-2 infection and should not be used as the sole basis for treatment or other patient management decisions. A negative result may occur with  improper specimen collection/handling, submission of specimen other than nasopharyngeal swab, presence of viral mutation(s) within the areas targeted by this assay, and inadequate number of viral copies(<138 copies/mL). A negative result must be combined with clinical observations, patient history, and epidemiological information. The expected result is Negative.  Fact Sheet for Patients:  EntrepreneurPulse.com.au  Fact Sheet for Healthcare Providers:  IncredibleEmployment.be  This test is no t yet approved or cleared by the [place] and  has been authorized for detection and/or diagnosis of SARS-CoV-2 by FDA under an Emergency Use Authorization (EUA). This EUA will remain  in effect (meaning this test can be used) for the duration of the COVID-19 declaration under Section 564(b)(1) of the Act, 21 U.S.C.section 360bbb-3(b)(1), unless the authorization is terminated  or revoked sooner.       Influenza A by PCR NEGATIVE NEGATIVE Final   Influenza B by PCR NEGATIVE NEGATIVE Final    Comment: (NOTE) The Xpert Xpress SARS-CoV-2/FLU/RSV plus assay is intended as an aid in the diagnosis of influenza from Nasopharyngeal swab specimens and should not be used as a sole basis for treatment. Nasal washings and aspirates are unacceptable for Xpert Xpress SARS-CoV-2/FLU/RSV testing.  Fact Sheet for Patients: EntrepreneurPulse.com.au  Fact Sheet for Healthcare Providers: IncredibleEmployment.be  This test is not yet approved or cleared by the [place] FDA and has been authorized for detection and/or diagnosis of SARS-CoV-2 by FDA under an Emergency Use Authorization (EUA). This EUA will remain in effect (meaning  this test can be used) for the duration of the COVID-19 declaration under Section 564(b)(1) of the Act, 21 U.S.C. section 360bbb-3(b)(1), unless the authorization is terminated or revoked.  Performed at [facility], [address], [place], [place] [address]          Radiology Studies: No results found.      Scheduled Meds:  aspirin EC  81 mg Oral Daily   benzonatate  200 mg Oral TID   Chlorhexidine Gluconate Cloth  6 each Topical Daily   dexamethasone  4 mg Oral Daily   feeding supplement  237 mL Oral TID BM   furosemide  20 mg Oral Daily   insulin aspart  0-15 Units Subcutaneous Q4H   insulin aspart  7 Units Subcutaneous Q4H   insulin glargine-yfgn  18 Units Subcutaneous BID   loratadine  10 mg Oral Daily   losartan  25 mg Oral Daily   mirtazapine  15 mg Oral QHS   pregabalin  75 mg Oral BID   sodium chloride flush  10-40 mL Intracatheter Q12H   Continuous Infusions:  TPN ADULT (ION) 83 mL/hr at 07/04/21 0152   TPN ADULT (ION)       LOS: 8 days    Time spent: 25 minutes    Sidney Ace, MD [facility] Pager [phone]  If 7PM-7AM, please contact night-coverage 07/04/2021, 1:22 PM

## 2021-07-04 NOTE — Consult Note (Signed)
PHARMACY - TOTAL PARENTERAL NUTRITION CONSULT NOTE   Indication: Fistula  Patient Measurements: Height: 5' 5.98" (167.6 cm) Weight: 68.5 kg (151 lb 0.2 oz) IBW/kg (Calculated) : 63.76 TPN AdjBW (KG): 74.4 Body mass index is 24.39 kg/m.  Assessment:  Patient is an 80 y/o M with medical history including diabetes, HTN, colon cancer with lung metastases, recent history of admission at OSH for cholecystitis s/p cholecystectomy c/b post-op sepsis. Patient underwent ExLap with findings of small bowel injury which was repaired. 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 He is now presenting to the hospital for ileostomy leakage. Imaging notable for multiple intra-abdominal abscess with contrast extravasation to midline of abdominal wound. General surgery is following. Pharmacy consulted to continue home TPN while patient is admitted.  Glucose / Insulin: History of controlled diabetes (last Hgb A1c 6.9%).   on SSI q4h (moderate): 18 units/last 24 hrs Novolog coverage 3 units q4hours (total Novolog 18 units/24 hours) Two doses of Lantus 10 units on 8/21 (total Lantus 20 units / 24 hours) Electrolytes: Hyponatremia (stable 130), Hyperkalemic (4.8>5.2) Renal: Scr < 1 Hepatic: LFTs within normal limits, except ALF trending up Intake / Output; MIVF: NS @ 10 ml/hr GI Imaging: 8/17 CT abdomen / pelvis: Large ventral wound containing small amount of contrast. Enterocutaneous fistula. Multiple intra-abdominal abscess(s). Mesenteric inflammation. Small bilateral pleural effusions with atelectasis or pneumonia at the bases. Metastatic lung nodules, slight increased size of right lower lobe metastatic nodule GI Surgeries / Procedures:  History of recent cholecystectomy, ExLap at OSH in 05/2021  Central access: POA TPN start date: 8/18   Outpatient TPN information: [facility] in [place], [place] Ph:  [phone]  Nutritional Goals: Goal TPN rate is 83 mL/hr (provides 105.6 g of protein and 2262 kcals per day)  RD Assessment: Estimated Needs Total Energy Estimated Needs: 2000-2300kcal/day Total Protein Estimated Needs: 100-115g/day Total Fluid Estimated Needs: 2.0-2.3L/day  Current Nutrition:  TPN  Plan:  Continue in-house TPN formulation at 83 mL/hr (2092 mL w/ overfill) Nutritional components: Amino acids (Travasol 10%): 105.6 grams Lipids (20% SMOF): 68.8 grams Dextrose: 338.6 grams Electrolytes in TPN (standard): Na 73mEq/L(increase Na from 55 meq/L to 60 meq/L on 8/21), K 25 mEq/L, Ca 5 mEq/L, Mg 50mEq/L, and Phos 36mmol/L. Cl:Ac 1:1 Add standard MVI and trace elements to TPN MD increased Lantus 16 units BID and increased Novolog 7 units q4 hr + SSI.  Electrolytes: Na stable 130-133. Continue 60 mEq/L K+ increased to 5.2. Will remove Kcl from TPN bag today.  Monitor TPN labs on Mon/Thurs, daily until stable F/u labs am 8/26   Wynelle Cleveland, PharmD Pharmacy Resident  07/04/2021 8:59 AM

## 2021-07-05 DIAGNOSIS — K9413 Enterostomy malfunction: Secondary | ICD-10-CM | POA: Diagnosis not present

## 2021-07-05 LAB — URINALYSIS, COMPLETE (UACMP) WITH MICROSCOPIC
Bilirubin Urine: NEGATIVE
Glucose, UA: 50 mg/dL — AB
Hgb urine dipstick: NEGATIVE
Ketones, ur: NEGATIVE mg/dL
Leukocytes,Ua: NEGATIVE
Nitrite: POSITIVE — AB
Protein, ur: NEGATIVE mg/dL
Specific Gravity, Urine: 1.019 (ref 1.005–1.030)
pH: 5 (ref 5.0–8.0)

## 2021-07-05 LAB — BASIC METABOLIC PANEL
Anion gap: 8 (ref 5–15)
BUN: 77 mg/dL — ABNORMAL HIGH (ref 8–23)
CO2: 20 mmol/L — ABNORMAL LOW (ref 22–32)
Calcium: 10.3 mg/dL (ref 8.9–10.3)
Chloride: 105 mmol/L (ref 98–111)
Creatinine, Ser: 0.86 mg/dL (ref 0.61–1.24)
GFR, Estimated: >60 mL/min (ref >60)
Glucose, Bld: 233 mg/dL — ABNORMAL HIGH (ref 70–99)
Potassium: 4.5 mmol/L (ref 3.5–5.1)
Sodium: 133 mmol/L — ABNORMAL LOW (ref 135–145)

## 2021-07-05 LAB — HEMOGLOBIN AND HEMATOCRIT, BLOOD
HCT: 29.4 % — ABNORMAL LOW (ref 39.0–52.0)
HCT: 30.4 % — ABNORMAL LOW (ref 39.0–52.0)
HCT: 33.5 % — ABNORMAL LOW (ref 39.0–52.0)
HCT: 32.4 % — ABNORMAL LOW (ref 39.0–52.0)
Hemoglobin: 9.4 g/dL — ABNORMAL LOW (ref 13.0–17.0)
Hemoglobin: 9.5 g/dL — ABNORMAL LOW (ref 13.0–17.0)
Hemoglobin: 10.3 g/dL — ABNORMAL LOW (ref 13.0–17.0)
Hemoglobin: 10 g/dL — ABNORMAL LOW (ref 13.0–17.0)

## 2021-07-05 LAB — GLUCOSE, CAPILLARY
Glucose-Capillary: 296 mg/dL — ABNORMAL HIGH (ref 70–99)
Glucose-Capillary: 242 mg/dL — ABNORMAL HIGH (ref 70–99)
Glucose-Capillary: 231 mg/dL — ABNORMAL HIGH (ref 70–99)
Glucose-Capillary: 350 mg/dL — ABNORMAL HIGH (ref 70–99)
Glucose-Capillary: 218 mg/dL — ABNORMAL HIGH (ref 70–99)
Glucose-Capillary: 256 mg/dL — ABNORMAL HIGH (ref 70–99)

## 2021-07-05 MED ORDER — LORAZEPAM 2 MG/ML IJ SOLN: 0.5000 mg | INTRAMUSCULAR | Status: DC | PRN

## 2021-07-05 MED ORDER — HALOPERIDOL LACTATE 5 MG/ML IJ SOLN
2.0000 mg | Freq: Four times a day (QID) | INTRAMUSCULAR | Status: DC | PRN
  Administered 2021-07-05 – 2021-07-16 (×4): 2 mg via INTRAMUSCULAR
  Filled 2021-07-05 (×5): qty 1

## 2021-07-05 MED ORDER — TRAVASOL 10 % IV SOLN
INTRAVENOUS | Status: AC
  Filled 2021-07-05: qty 534.2

## 2021-07-05 MED ORDER — DEXTROSE 10 % IV SOLN: INTRAVENOUS | Status: DC

## 2021-07-05 MED ORDER — LORAZEPAM 0.5 MG PO TABS
0.5000 mg | ORAL_TABLET | ORAL | Status: DC | PRN
  Administered 2021-07-05: 1 mg via ORAL
  Filled 2021-07-05: qty 2

## 2021-07-05 MED ORDER — INSULIN GLARGINE-YFGN 100 UNIT/ML ~~LOC~~ SOLN
20.0000 [IU] | Freq: Two times a day (BID) | SUBCUTANEOUS | Status: DC
  Administered 2021-07-05 (×2): 20 [IU] via SUBCUTANEOUS
  Filled 2021-07-05 (×4): qty 0.2

## 2021-07-05 NOTE — Evaluation (Signed)
Occupational Therapy Evaluation Patient Details Name: Miguel Flores MRN: [id number] DOB: 1940-11-24 Today's Date: 07/05/2021    History of Present Illness 80 y.o. 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 [name] who presented with draining enterocutaneous fistula.     Patient has a complicated recent abdominal surgery history (see Dr. Deniece Ree note from 8/17) that began 6 weeks ago with cholecystitis at a hospital in [facility].  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  He ultimately went home, initially doing well and then started having issues with the ileostomy.   Clinical Impression   Mr Levene was seen for OT evaluation this date. Prior to hospital admission, pt was Independent for mobility and ADLs. Pt lives alone in home c ramped entrance, sons recently providing 24/7 care. Pt presents to acute OT demonstrating impaired ADL performance and functional mobility 2/2 decreased activity tolerance, poor command following, and functional strength/ROM/balance deficits. RN reporting need to change ostomy bag prior to donning ostomy belt required for mobility. RN in room during session to complete and educate OT on proper wearing of ostomy belt.  Pt currently requires MOD A don B socks at bed level - pt presents with poor command following as pt repeatedly folds sock instead of initiating donning. MIN A + HHA for ADL t/f and single UE support for functional reaching, +1 for safety 2/2 poor safety awareness and impaired communication. Pt would benefit from skilled OT to address noted impairments and functional limitations (see below for any additional details) in order to maximize safety and independence while minimizing falls risk and caregiver burden. Upon hospital discharge, recommend STR to  maximize pt safety and return to PLOF.     Follow Up Recommendations  SNF    Equipment Recommendations  Other (comment) (TBD)    Recommendations for Other Services       Precautions / Restrictions Precautions Precautions: Fall Precaution Comments: ostomy (plus ostomy belt) and IV's; L chest port; R PICC Restrictions Weight Bearing Restrictions: No      Mobility Bed Mobility Overal bed mobility: Needs Assistance Bed Mobility: Supine to Sit;Sit to Supine     Supine to sit: Min assist;HOB elevated Sit to supine: Min guard        Transfers Overall transfer level: Needs assistance Equipment used: 1 person hand held assist Transfers: Sit to/from Stand Sit to Stand: Min assist              Balance Overall balance assessment: Needs assistance Sitting-balance support: No upper extremity supported;Feet supported Sitting balance-Leahy Scale: Good     Standing balance support: Single extremity supported Standing balance-Leahy Scale: Fair                             ADL either performed or assessed with clinical judgement   ADL Overall ADL's : Needs assistance/impaired                                       General ADL Comments: MOD A don B socks at bed level - pt presents with poor command following as pt repeatedly folds sock instead of initiating donning. MIN A + HHA for ADL  t/f and single UE support for functional reaching, +1 for safety 2/2 poor safety awareness and impaired communication      Pertinent Vitals/Pain Pain Assessment: Faces Faces Pain Scale: Hurts a little bit Pain Location: ostomy site c dressing change Pain Descriptors / Indicators: Discomfort;Dull Pain Intervention(s): Limited activity within patient's tolerance;Repositioned     Hand Dominance Right   Extremity/Trunk Assessment Upper Extremity Assessment Upper Extremity Assessment: Overall WFL for tasks assessed   Lower Extremity Assessment Lower  Extremity Assessment: Generalized weakness       Communication Communication Communication: Expressive difficulties   Cognition Arousal/Alertness: Awake/alert Behavior During Therapy: Flat affect Overall Cognitive Status: Impaired/Different from baseline Area of Impairment: Orientation;Attention;Memory;Following commands;Safety/judgement;Problem solving                 Orientation Level: Disoriented to;Place;Time   Memory: Decreased short-term memory;Decreased recall of precautions Following Commands: Follows one step commands inconsistently Safety/Judgement: Decreased awareness of safety;Decreased awareness of deficits   Problem Solving: Slow processing;Decreased initiation;Difficulty sequencing;Requires verbal cues;Requires tactile cues General Comments: difficulty expressing his needs this session, garbled speech.   General Comments  ostomy dressing changed by RN in room    Exercises Exercises: Other exercises Other Exercises Other Exercises: Pt and family educated re: OT role, DME recs, d/c recs, falls prevention, pain mgmt strategies Other Exercises: LBD, toileting, sup<>sit, sit<>stand, sitting/standing balance/tolerance   Shoulder Instructions      Home Living Family/patient expects to be discharged to:: Private residence Living Arrangements: Alone Available Help at Discharge: Family (sons providing 24/7 but 1 returning to work soon) Type of Home: House Home Access: [place]: One [place]: Environmental consultant - 2 wheels;Cane - single point          Prior Functioning/Environment Level of Independence: Independent        Comments: apparently pt is typically able to be quite active, less so post surgery - and especially recently with leaking/dysfunctioning ileostomy        OT Problem List: Decreased strength;Decreased range of motion;Decreased activity tolerance;Impaired balance (sitting and/or  standing);Decreased safety awareness;Decreased knowledge of use of DME or AE      OT Treatment/Interventions: Self-care/ADL training;Therapeutic exercise;Energy conservation;DME and/or AE instruction;Therapeutic activities;Patient/family education;Balance training    OT Goals(Current goals can be found in the care plan section) Acute Rehab OT Goals Patient Stated Goal: to improve strength and mobility OT Goal Formulation: With family Time For Goal Achievement: 07/19/21 Potential to Achieve Goals: Fair ADL Goals Pt Will Perform Grooming: with modified independence;sitting Pt Will Perform Lower Body Dressing: with min assist;sit to/from stand;with caregiver independent in assisting (c LRAD PRN) Pt Will Transfer to Toilet: with supervision;ambulating;bedside commode (c LRAD PRN)  OT Frequency: Min 1X/week   Barriers to D/C: Decreased caregiver support          Co-evaluation              AM-PAC OT "6 Clicks" Daily Activity     Outcome Measure Help from another person eating meals?: None Help from another person taking care of personal grooming?: A Little Help from another person toileting, which includes using toliet, bedpan, or urinal?: A Little Help from another person bathing (including washing, rinsing, drying)?: A Little Help from another person to put on and taking off regular upper body clothing?: A Little Help from another person to put on and taking off regular lower body clothing?: A  Lot 6 Click Score: 18   End of Session Nurse Communication: Mobility status  Activity Tolerance: Patient tolerated treatment well Patient left: in bed;with call bell/phone within reach;with bed alarm set  OT Visit Diagnosis: Other abnormalities of gait and mobility (R26.89);Muscle weakness (generalized) (M62.81)                Time: 2979-8921 OT Time Calculation (min): 37 min Charges:  OT General Charges $OT Visit: 1 Visit OT Evaluation $OT Eval Low Complexity: 1 Low OT  Treatments $Self Care/Home Management : 23-37 mins  Dessie Coma, M.S. OTR/L  07/05/21, 2:00 PM  ascom [phone]

## 2021-07-05 NOTE — Progress Notes (Signed)
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  He did had evidence of metastatic lung disease but is was able to be controlled with chemotherapy.  He presented to [facility] 6 weeks ago for acute cholecystitis and underwent laparoscopic cholecystectomy on May 16, 2021.  His hospitalization was complicated by missed bowel injury requiring exploratory laparotomy and multiple interventions.  Eventually the patient developed enterocutaneous fistula and was in the hospital for over 4 weeks.  Now he presents with issues regarding enterocutaneous fistula and the [place]akin pouch leaking. I personally reviewed the CT scan and there is evidence of a small collections that given his primary pathology, general surgery will not attempt any percutaneous drainage.  This may lead to more complications given his EC fistula and friable bowel.   Patient admitted 06/26/2021 for problems with fistula and bag catching the stool.  Wound care has been able to get a bag that is doing the symptoms and catching his stool and only leaking intermittently.  The patient does have a cough and has a little interstitial edema and bilateral pleural effusions and that improved a little bit with IV Lasix and IV albumin.  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 [name] as outpatient.  Disposition and become very challenging.  The family is unable to provide the care that they feel the patient needs for home.  TPN is the major barrier to finding a accepting facility.  Per TOC she is reached out to 40+ facilities without an accepting one with the majority claiming that TPN is the barrier to admission.  I discussed this at length with patient and patient's son [name] on phone.  Current plan of  care is to attempt to wean off of TPN.  If we are able to do so and patient can meet the majority of his nutritional needs we can get him to a skilled nursing facility and he can seek surgical follow-up at tertiary care center.  As of 8/26 patient is starting to eat more.  We are tapering TPN.  Current rate 83 mL/h.  Taper down to 42 mL/h   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HCC) Active Problems:   Essential hypertension   Type 2 diabetes mellitus with hyperlipidemia (HCC)   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HCC)   Lung metastases (HCC)   Post cholecystectomy intraabdominal abscess   S/P cholecystectomy   On total parenteral nutrition (TPN)   Malnutrition of moderate degree   Overweight (BMI 25.0-29.9)   Enterocutaneous fistula   Anemia of chronic disease   Hyponatremia   Cough   Pleural effusion   Acute metabolic encephalopathy   Palliative care encounter  Enterocutaneous fistula Multiple fluid collections on CT abdomen Extensive and complex surgical history Majority of care at outside facility in [facility] surgery consulted here They will facilitate outpatient evaluation and referral to tertiary care facility however will be 6 months before any intervention Currently no recommendations of intervention on the abdomen W OC consulted for fistula and ostomy care  Acute metabolic encephalopathy Baseline is unclear however concern the patient was confused.  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 [name] as outpatient  No active oncology issues however palliative care nurse practitioner [name] was consulted for goals of care At this time patient and family not interested in de-escalation of care Outpatient follow-up  Hyponatremia Likely secondary to malnutrition and poor p.o. intake Currently on  TPN, will continue  Moderate protein calorie malnutrition Currently on TPN.  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  At this point unclear to me whether patient would be safe at home environment.  I spoke at length to his son [name] and the patient lives alone and prior to his hospitalization at [facility] was apparently robust and fully independent.  There is been a significant decline in his functional status and at this point0 I am not sure that home environment would be safe disposition and would likely result in readmission.  At this point TPN is the major barrier to skilled nursing facility placement so we will attempt to wean from TPN if the patient is able to meet some of his nutritional goals.  Patient has been trying to eat more and has been receptive to encouragement from nursing staff    DVT prophylaxis: SQ Lovenox Code Status: Full Family Communication: [name] [phone] on 8/24, 8/25 Disposition Plan: Status is: Inpatient  Remains inpatient appropriate because:Inpatient level of care appropriate due to severity of illness  Dispo: The patient is from: Home              Anticipated d/c is to:  TBD              Patient currently is not medically stable to DC   Difficult to place patient Yes  Patient is not medically stable for discharge.  His nutritional status is poor and we are attempting to wean from TPN in order to secure a safe disposition plan to skilled nursing facility.  At this point in my  medical opinion I feel that disposition home is not safe.  He has a very complex surgical abdomen and an ostomy that frequently leaks and requires specific attention and care.     Level of care: Med-Surg  Consultants:  General surgery  Procedures:  None  Antimicrobials:  None   Subjective: Seen and examined.  Family member at bedside.  Patient in good spirits this morning.  Sitting up in bed eating breakfast.  Objective: Vitals:   07/04/21 1949 07/05/21 0500 07/05/21 0532 07/05/21 0821  BP: 137/63  (!) 153/71 (!) 144/62  Pulse: 96  90 86  Resp: (!) 21  (!) 21 17  Temp: (!) 97.3 F (36.3 C)  97.6 F (36.4 C) 97.7 F (36.5 C)  TempSrc: Oral  Oral   SpO2: 99%  100% 100%  Weight:  67.7 kg    Height:        Intake/Output Summary (Last 24 hours) at 07/05/2021 1223 Last data filed at 07/05/2021 0547 Gross per 24 hour  Intake 741.07 ml  Output 2050 ml  Net -1308.93 ml   Filed Weights   07/03/21 1315 07/04/21 0500 07/05/21 0500  Weight: 71.6 kg 68.5 kg 67.7  kg    Examination:  General exam: No acute distress.  Sitting up in bed eating breakfast.  Appears frail Respiratory system: Lungs clear.  Normal work of breathing.  Room air Cardiovascular system: S1-S2, regular rate and rhythm, no murmurs, no pedal edema  gastrointestinal system: Thin, nontender, nondistended, normal bowel sounds Central nervous system: Awake and alert, no focal deficits Extremities: Symmetrically decreased power bilaterally upper and lower extremities Skin: Pale with scattered excoriations Psychiatry: Judgement and insight appear impaired. Mood & affect flattened.     Data Reviewed: I have personally reviewed following labs and imaging studies  CBC: Recent Labs  Lab 07/01/21 0546 07/05/21 0103 07/05/21 0455  WBC 8.4  --   --   NEUTROABS 5.0  --   --   HGB 9.0* 9.4* 9.5*  HCT 29.1* 29.4* 30.4*  MCV 87.1  --   --   PLT 308  --   --    Basic Metabolic Panel: Recent Labs  Lab  06/29/21 0436 06/30/21 0611 07/01/21 0546 07/02/21 0500 07/03/21 0808 07/04/21 0508 07/05/21 0455  NA 130* 130* 130* 131* 133* 130* 133*  K 4.5 4.7 5.1 4.6 4.8 5.2* 4.5  CL 102 102 103 100 99 99 105  CO2 22 25 25  20* 23 22 20*  GLUCOSE 221* 174* 227* 256* 286* 342* 233*  BUN 20 24* 33* 46* 54* 60* 77*  CREATININE 0.60* 0.64 0.80 0.88 0.96 0.89 0.86  CALCIUM 8.5* 8.9 9.1 9.5 10.0 10.0 10.3  MG 1.8 1.9 1.9  --   --  2.0  --   PHOS 3.2 3.8 4.1  --   --  3.0  --    GFR: Estimated Creatinine Clearance: 60.8 mL/min (by C-G formula based on SCr of 0.86 mg/dL). Liver Function Tests: Recent Labs  Lab 06/29/21 0436 07/01/21 0546 07/04/21 0508  AST 30 25 35  ALT 23 25 35  ALKPHOS 172* 175* 184*  BILITOT 0.5 0.5 0.6  PROT 6.7 7.1 8.4*  ALBUMIN 2.1* 2.4* 3.4*   No results for input(s): LIPASE, AMYLASE in the last 168 hours. No results for input(s): AMMONIA in the last 168 hours. Coagulation Profile: No results for input(s): INR, PROTIME in the last 168 hours. Cardiac Enzymes: No results for input(s): CKTOTAL, CKMB, CKMBINDEX, TROPONINI in the last 168 hours. BNP (last 3 results) No results for input(s): PROBNP in the last 8760 hours. HbA1C: No results for input(s): HGBA1C in the last 72 hours. CBG: Recent Labs  Lab 07/04/21 2053 07/05/21 0118 07/05/21 0446 07/05/21 0754 07/05/21 1133  GLUCAP 386* 296* 242* 231* 350*   Lipid Profile: No results for input(s): CHOL, HDL, LDLCALC, TRIG, CHOLHDL, LDLDIRECT in the last 72 hours.  Thyroid Function Tests: No results for input(s): TSH, T4TOTAL, FREET4, T3FREE, THYROIDAB in the last 72 hours. Anemia Panel: No results for input(s): VITAMINB12, FOLATE, FERRITIN, TIBC, IRON, RETICCTPCT in the last 72 hours. Sepsis Labs: No results for input(s): PROCALCITON, LATICACIDVEN in the last 168 hours.   Recent Results (from the past 240 hour(s))  Resp Panel by RT-PCR (Flu A&B, Covid) Nasopharyngeal Swab     Status: None   Collection  Time: 06/26/21  7:31 PM   Specimen: Nasopharyngeal Swab; Nasopharyngeal(NP) swabs in vial transport medium  Result Value Ref Range Status   SARS Coronavirus 2 by RT PCR NEGATIVE NEGATIVE Final    Comment: (NOTE) SARS-CoV-2 target nucleic acids are NOT DETECTED.  The SARS-CoV-2 RNA is generally detectable in upper respiratory specimens during the acute phase  of infection. The lowest concentration of SARS-CoV-2 viral copies this assay can detect is 138 copies/mL. A negative result does not preclude SARS-Cov-2 infection and should not be used as the sole basis for treatment or other patient management decisions. A negative result may occur with  improper specimen collection/handling, submission of specimen other than nasopharyngeal swab, presence of viral mutation(s) within the areas targeted by this assay, and inadequate number of viral copies(<138 copies/mL). A negative result must be combined with clinical observations, patient history, and epidemiological information. The expected result is Negative.  Fact Sheet for Patients:  EntrepreneurPulse.com.au  Fact Sheet for Healthcare Providers:  IncredibleEmployment.be  This test is no t yet approved or cleared by the [place] FDA and  has been authorized for detection and/or diagnosis of SARS-CoV-2 by FDA under an Emergency Use Authorization (EUA). This EUA will remain  in effect (meaning this test can be used) for the duration of the COVID-19 declaration under Section 564(b)(1) of the Act, 21 U.S.C.section 360bbb-3(b)(1), unless the authorization is terminated  or revoked sooner.       Influenza A by PCR NEGATIVE NEGATIVE Final   Influenza B by PCR NEGATIVE NEGATIVE Final    Comment: (NOTE) The Xpert Xpress SARS-CoV-2/FLU/RSV plus assay is intended as an aid in the diagnosis of influenza from Nasopharyngeal swab specimens and should not be used as a sole basis for treatment. Nasal washings  and aspirates are unacceptable for Xpert Xpress SARS-CoV-2/FLU/RSV testing.  Fact Sheet for Patients: EntrepreneurPulse.com.au  Fact Sheet for Healthcare Providers: IncredibleEmployment.be  This test is not yet approved or cleared by the [place] FDA and has been authorized for detection and/or diagnosis of SARS-CoV-2 by FDA under an Emergency Use Authorization (EUA). This EUA will remain in effect (meaning this test can be used) for the duration of the COVID-19 declaration under Section 564(b)(1) of the Act, 21 U.S.C. section 360bbb-3(b)(1), unless the authorization is terminated or revoked.  Performed at [facility], [address] [place], [place] [address]          Radiology Studies: No results found.      Scheduled Meds:  aspirin EC  81 mg Oral Daily   benzonatate  200 mg Oral TID   Chlorhexidine Gluconate Cloth  6 each Topical Daily   dexamethasone  4 mg Oral Daily   feeding supplement  237 mL Oral TID BM   furosemide  20 mg Oral Daily   insulin aspart  0-15 Units Subcutaneous Q4H   insulin aspart  7 Units Subcutaneous Q4H   insulin glargine-yfgn  20 Units Subcutaneous BID   loratadine  10 mg Oral Daily   losartan  25 mg Oral Daily   mirtazapine  15 mg Oral QHS   pregabalin  75 mg Oral BID   sodium chloride flush  10-40 mL Intracatheter Q12H   Continuous Infusions:  TPN ADULT (ION) 83 mL/hr at 07/05/21 0547   TPN ADULT (ION)       LOS: 9 days    Time spent: 25 minutes    Sidney Ace, MD [facility] Pager [phone]  If 7PM-7AM, please contact night-coverage 07/05/2021, 12:23 PM

## 2021-07-05 NOTE — Progress Notes (Signed)
Patient's Ileostomy pouch changed 2 more times tonight. One occurrence patient had pulled ostomy pouch off but it may have been leaking some around the bottom. Will continue to monitor patient.

## 2021-07-05 NOTE — Consult Note (Signed)
PHARMACY - TOTAL PARENTERAL NUTRITION CONSULT NOTE   Indication: Fistula  Patient Measurements: Height: 5' 5.98" (167.6 cm) Weight: 67.7 kg (149 lb 4 oz) IBW/kg (Calculated) : 63.76 TPN AdjBW (KG): 74.4 Body mass index is 24.1 kg/m.  Assessment:  Patient is an 80 y/o M with medical history including diabetes, HTN, colon cancer with lung metastases, recent history of admission at OSH for cholecystitis s/p cholecystectomy c/b post-op sepsis. Patient underwent ExLap with findings of small bowel injury which was repaired. 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 He is now presenting to the hospital for ileostomy leakage. Imaging notable for multiple intra-abdominal abscess with contrast extravasation to midline of abdominal wound. General surgery is following. Pharmacy consulted to continue home TPN while patient is admitted.  Glucose / Insulin: History of controlled diabetes (last Hgb A1c 6.9%).   on SSI q4h (moderate): 42 units/last 24 hrs Novolog coverage 7 units q4hours (total Novolog 42 units/24 hours) Two doses of Lantus 10 units on 8/21 (total Lantus 20 units / 24 hours) Electrolytes: Hyponatremia (stable 133), Potassium improved (5.2>4.5) Renal: Scr < 1 Hepatic: LFTs within normal limits Intake / Output; MIVF: NS @ 10 ml/hr GI Imaging: 8/17 CT abdomen / pelvis: Large ventral wound containing small amount of contrast. Enterocutaneous fistula. Multiple intra-abdominal abscess(s). Mesenteric inflammation. Small bilateral pleural effusions with atelectasis or pneumonia at the bases. Metastatic lung nodules, slight increased size of right lower lobe metastatic nodule GI Surgeries / Procedures:  History of recent cholecystectomy, ExLap at OSH in 05/2021  Central access: POA TPN start date: 8/18   Outpatient TPN information: [facility] in [place], [place] Ph: [phone]  Nutritional Goals: Goal TPN  rate is 83 mL/hr (provides 105.6 g of protein and 2262 kcals per day)  RD Assessment: Estimated Needs Total Energy Estimated Needs: 2000-2300kcal/day Total Protein Estimated Needs: 100-115g/day Total Fluid Estimated Needs: 2.0-2.3L/day  Current Nutrition:  TPN  Plan:  Starting to taper TPN rate from 83 mL/hr to 42 mL/hr per dietician recommendations. Patient has been drinking Ensures over the past day and is beginning to eat meals. Nutritional components: Amino acids (Travasol 10%): 53.42 grams Lipids (20% SMOF): 34.8 grams Dextrose: 171.36 grams Electrolytes in TPN (standard): Na 35mEq/L(increase Na from 55 meq/L to 60 meq/L on 8/21), K 15 mEq/L, Ca 5 mEq/L, Mg 65mEq/L, and Phos 81mmol/L. Cl:Ac 1:1 On 8/24, added Kcl 25 mEq in bag, K increased to 5.2 on 8/25, therefore Kcl was removed from TPN on 8/25. Then, on 8.26, K decreased to 4.5. Now adding back Kcl 15 meq. Add standard MVI and trace elements to TPN MD increased Lantus 20 units BID and increased Novolog 7 units q4 hr + SSI.  Monitor TPN labs on Mon/Thurs, daily until stable F/u labs am 8/27   Wynelle Cleveland, PharmD Pharmacy Resident  07/05/2021 10:10 AM

## 2021-07-05 NOTE — Consult Note (Signed)
[place] Nurse Consult Note: Patient continues to receive care in [facility]. I went by to see patient. The night shift off-going RN, Jodie, and dayshift on-coming RN, Ellison Hughs in room.  Jodie explained he pulled his pouch off once, and it leaked 3 other times during her shift.  The existing pouch was not leaking. Jodie had elected not to place the ostomy belt on. She wanted the pouch angled to the side to ease emptying.  I explained the belt helps present leakage by holding the pouching system in place. And, if PT or OT gets the patient up, it must be on. Ambulation and movement increases the pouch leakage and dislodgement.    The staff are transmitting information amongst themselves in providing care for the fistula.  I am signing off the [place] service.  Orders are present to guide staff on fistula management.  I am signing the [place] team off.  Please re-consult if needed.  Val Riles, RN, MSN, CWOCN, CNS-BC, pager [phone]

## 2021-07-05 NOTE — Progress Notes (Signed)
Patient ileostomy pouch has been changed twice so far tonight. Patient seems to be having more blood coming from the ileosotmy. Notified Dr. Sidney Ace new orders for H and H labs placed per Dr. Sidney Ace. Will continue to monitor patient.

## 2021-07-05 NOTE — Progress Notes (Signed)
Physical Therapy Treatment Patient Details Name: Miguel Flores MRN: [id number] DOB: 04-Dec-1940 Today's Date: 07/05/2021    History of Present Illness 80 y.o. 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 [name] who presented with draining enterocutaneous fistula.     Patient has a complicated recent abdominal surgery history (see Dr. Deniece Ree note from 8/17) that began 6 weeks ago with cholecystitis at a hospital in [facility].  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  He ultimately went home, initially doing well and then started having issues with the ileostomy.    PT Comments    Pt sleeping in bed upon PT arrival; woken with vc's and tactile cues.  Pt continues to appear confused.  Min assist semi-supine to sitting edge of bed; min assist with transfers; and min assist to ambulate 5 feet with RW (limited distance ambulating d/t pt appearing shaky and not safe to ambulate further).  Pt sitting up in recliner with NT present assisting pt with eating end of session.  Will continue to focus on strengthening and progressive functional mobility per pt tolerance.    Follow Up Recommendations  SNF     Equipment Recommendations  Rolling walker with 5" wheels;3in1 (PT)    Recommendations for Other Services       Precautions / Restrictions Precautions Precautions: Fall Precaution Comments: ostomy (plus needs to wear ostomy belt) and IV's; L chest port; R PICC Restrictions Weight Bearing Restrictions: No    Mobility  Bed Mobility Overal bed mobility: Needs Assistance Bed Mobility: Supine to Sit     Supine to sit: Min assist;HOB elevated   General bed mobility comments: via logrolling towards R side; vc's for technique; increased effort for pt to perform (relied on UE to sit up);  assist for trunk    Transfers Overall transfer level: Needs assistance Equipment used: Rolling walker (2 wheeled) Transfers: Sit to/from Stand Sit to Stand: Min assist         General transfer comment: increased effort and time to stand up to RW (assist to initiate stand up to walker and control descent sitting)  Ambulation/Gait Ambulation/Gait assistance: Min assist Gait Distance (Feet): 5 Feet Assistive device: Rolling walker (2 wheeled)   Gait velocity: decreased   General Gait Details: partial step through gait pattern; shaky; increased effort and time to take steps requiring min assist for safety/balance   Stairs             Wheelchair Mobility    Modified Rankin (Stroke Patients Only)       Balance Overall balance assessment: Needs assistance Sitting-balance support: No upper extremity supported;Feet supported Sitting balance-Leahy Scale: Good Sitting balance - Comments: steady sitting reaching within BOS   Standing balance support: Single extremity supported Standing balance-Leahy Scale: Fair Standing balance comment: steady standing with single UE support                            Cognition Arousal/Alertness: Awake/alert Behavior During Therapy: Flat affect Overall Cognitive Status: Impaired/Different from baseline Area of Impairment: Orientation;Attention;Memory;Following commands;Safety/judgement;Problem solving                 Orientation Level: Disoriented to;Place;Time   Memory: Decreased short-term memory;Decreased recall of precautions Following Commands: Follows one step commands  inconsistently Safety/Judgement: Decreased awareness of safety;Decreased awareness of deficits   Problem Solving: Slow processing;Decreased initiation;Difficulty sequencing;Requires verbal cues;Requires tactile cues General Comments: difficult to understand speech at times      Exercises     General Comments General comments (skin  integrity, edema, etc.): ostomy dressing intact beginning/end of session.  Nursing cleared pt for participation in physical therapy.  Pt agreeable to PT session.      Pertinent Vitals/Pain Pain Assessment: Faces Faces Pain Scale: No hurt Pain Location: ostomy site c dressing change Pain Descriptors / Indicators: Discomfort;Dull Pain Intervention(s): Limited activity within patient's tolerance;Monitored during session;Repositioned Vitals (HR and O2 on room air) stable and WFL throughout treatment session.    Home Living      Prior Function     PT Goals (current goals can now be found in the care plan section) Acute Rehab PT Goals Patient Stated Goal: to improve strength and mobility PT Goal Formulation: With patient Time For Goal Achievement: 07/11/21 Potential to Achieve Goals: Fair Progress towards PT goals: Progressing toward goals    Frequency    Min 2X/week      PT Plan Current plan remains appropriate    Co-evaluation              AM-PAC PT "6 Clicks" Mobility   Outcome Measure  Help needed turning from your back to your side while in a flat bed without using bedrails?: None Help needed moving from lying on your back to sitting on the side of a flat bed without using bedrails?: A Little Help needed moving to and from a bed to a chair (including a wheelchair)?: A Little Help needed standing up from a chair using your arms (e.g., wheelchair or bedside chair)?: A Little Help needed to walk in hospital room?: A Lot Help needed climbing 3-5 steps with a railing? : A Lot 6 Click Score: 17    End of Session Equipment Utilized During Treatment: Gait belt (above ostomy and below chest port) Activity Tolerance: Patient limited by fatigue Patient left: in chair;with call bell/phone within reach;with chair alarm set;with nursing/sitter in room;with family/visitor present Nurse Communication: Mobility status;Precautions PT Visit Diagnosis: Muscle weakness  (generalized) (M62.81);Difficulty in walking, not elsewhere classified (R26.2)     Time: 8616-8372 PT Time Calculation (min) (ACUTE ONLY): 27 min  Charges:  $Therapeutic Activity: 23-37 mins                    Leitha Bleak, PT 07/05/21, 3:13 PM

## 2021-07-05 NOTE — Progress Notes (Addendum)
Calorie Count Note Day 2  48 hour calorie count ordered. Placed meal ticket on door and discussed instructions for completion with nursing staff yesterday and reiterated with staff today.  Diet: DYS 3 Supplements: Ensure Enlive TID, Magic Cup TID  Breakfast: 136 kcal, 4g of protein  Lunch: 67 kcal and 3g of protein from meal, 73 kcal and 3g of protein from magic cup Total for lunch: 140 kcal, 6g  Dinner: 106kcal and 5g of protein from meal, 232kcal and 7g of protein from magic cup Total for Dinner: 338kcal, 12g of protein  Bedtime snack: 190kcal, 2g of protein  Supplements: 3/4 ensure plus, 262 kcal and 10g of protein Total for supplements: 262 kcal, 10g of protein  Pt napping at the time of visit, lunch tray at bedside untouched so far. Visitor in room said nursing staff was going to attempt to get pt to eat. 1 ensure plus noted at bedside ~75% consumed. Appetite today does not seem as hearty as yesterday. Discussed with pharmacy and MD. Based on intake yesterday would say weaning TPN is appropriate but would caution against completely discontinuing until pt proves he can consistently meet his needs orally. Will continue to monitor intake.  Addendum 8/29: Added meal totals for the rest of the day and re-evaluated nutrition needs. Based on documentation, oral intake inadequate to meet estimated needs without TPN support at this time.  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  Goal: Patient will meet greater than or equal to 90% of their needs orally prior to discharge  Intervention:  Continue current diet as ordered, encourage PO intake TPN per  pharmacy, wean to 50% goal rate night Ensure Enlive po TID, each supplement provides 350 kcal and 20 grams of protein Magic cup TID with meals, each supplement provides 290 kcal and 9 grams of protein  Ranell Patrick, RD, LDN Clinical Dietitian Pager on [place]

## 2021-07-05 NOTE — Progress Notes (Signed)
Inpatient Diabetes Program Recommendations  AACE/ADA: New Consensus Statement on Inpatient Glycemic Control (2015)  Target Ranges:  Prepandial:   less than 140 mg/dL      Peak postprandial:   less than 180 mg/dL (1-2 hours)      Critically ill patients:  140 - 180 mg/dL  Results for TRAVANTE, KNEE (MRN [id number]) as of 07/05/2021 07:49  Ref. Range 07/04/2021 00:25 07/04/2021 04:50 07/04/2021 08:01 07/04/2021 11:51 07/04/2021 16:20 07/04/2021 20:53  Glucose-Capillary Latest Ref Range: 70 - 99 mg/dL 376 (H)  19 units Novolog  327 (H)  15 units Novolog  293 (H)  15 units Novolog  16 units Semglee  373 (H)  22 units Novolog  342 (H)  18 units Novolog  386 (H)  22 units Novolog  18 units Semglee   Results for GEROGE, GILLIAM (MRN [id number]) as of 07/05/2021 07:49  Ref. Range 07/05/2021 01:18 07/05/2021 04:46  Glucose-Capillary Latest Ref Range: 70 - 99 mg/dL 296 (H)  15 units Novolog  242 (H)  12 units Novolog      Home DM Meds: Amaryl 2 mg BID       Metformin 1000 mg AM/ 0102 mg PM       Trulicity 1.5 mg Qweek   Current Orders: Semglee 20 units BID      Novolog Moderate Correction Scale/ SSI (0-15 units) Q4 hours      Novolog 7 units Q4 hours    --Getting TPN 83cc/hr (NO Insulin in the TPN)  --Pt getting Novolog Q4 hour (Set dose) for coverage of the TPN  --Also getting Ensure Enlive PO supps TID between meals   MD- Note Semglee increased to 20 units BID this AM.  May consider increasing a little further to 25 units BID.  Received total of 111 units Novolog yesterday 08/25  May also consider increasing the set dose of Novolog Q4 hours to 12 units Q4 hours  (Make sure to reduce this dose once TPN starts to wean)    --Will follow patient during hospitalization--  Wyn Quaker RN, MSN, CDE Diabetes Coordinator Inpatient Glycemic Control Team Team Pager: [phone] (8a-5p)

## 2021-07-06 ENCOUNTER — Inpatient Hospital Stay: Payer: Medicare Other

## 2021-07-06 ENCOUNTER — Inpatient Hospital Stay: Payer: Self-pay

## 2021-07-06 ENCOUNTER — Encounter: Payer: Self-pay | Admitting: Oncology

## 2021-07-06 LAB — HEMOGLOBIN AND HEMATOCRIT, BLOOD
HCT: 31.2 % — ABNORMAL LOW (ref 39.0–52.0)
HCT: 33.3 % — ABNORMAL LOW (ref 39.0–52.0)
HCT: 35.8 % — ABNORMAL LOW (ref 39.0–52.0)
HCT: 36.3 % — ABNORMAL LOW (ref 39.0–52.0)
Hemoglobin: 10.3 g/dL — ABNORMAL LOW (ref 13.0–17.0)
Hemoglobin: 10.7 g/dL — ABNORMAL LOW (ref 13.0–17.0)
Hemoglobin: 10.9 g/dL — ABNORMAL LOW (ref 13.0–17.0)
Hemoglobin: 9.8 g/dL — ABNORMAL LOW (ref 13.0–17.0)

## 2021-07-06 LAB — GLUCOSE, CAPILLARY
Glucose-Capillary: 102 mg/dL — ABNORMAL HIGH (ref 70–99)
Glucose-Capillary: 110 mg/dL — ABNORMAL HIGH (ref 70–99)
Glucose-Capillary: 128 mg/dL — ABNORMAL HIGH (ref 70–99)
Glucose-Capillary: 206 mg/dL — ABNORMAL HIGH (ref 70–99)
Glucose-Capillary: 271 mg/dL — ABNORMAL HIGH (ref 70–99)
Glucose-Capillary: 325 mg/dL — ABNORMAL HIGH (ref 70–99)

## 2021-07-06 LAB — BASIC METABOLIC PANEL
Anion gap: 6 (ref 5–15)
BUN: 86 mg/dL — ABNORMAL HIGH (ref 8–23)
CO2: 20 mmol/L — ABNORMAL LOW (ref 22–32)
Calcium: 10 mg/dL (ref 8.9–10.3)
Chloride: 110 mmol/L (ref 98–111)
Creatinine, Ser: 0.97 mg/dL (ref 0.61–1.24)
GFR, Estimated: >60 mL/min (ref >60)
Glucose, Bld: 133 mg/dL — ABNORMAL HIGH (ref 70–99)
Potassium: 4.7 mmol/L (ref 3.5–5.1)
Sodium: 136 mmol/L (ref 135–145)

## 2021-07-06 MED ORDER — INSULIN GLARGINE-YFGN 100 UNIT/ML ~~LOC~~ SOLN
10.0000 [IU] | Freq: Two times a day (BID) | SUBCUTANEOUS | Status: DC
  Administered 2021-07-06 – 2021-07-08 (×4): 10 [IU] via SUBCUTANEOUS
  Filled 2021-07-06 (×6): qty 0.1

## 2021-07-06 MED ORDER — TRAVASOL 10 % IV SOLN
INTRAVENOUS | Status: AC
  Filled 2021-07-06: qty 534.2

## 2021-07-06 MED ORDER — SODIUM CHLORIDE 0.9 % IV SOLN
2.0000 g | INTRAVENOUS | Status: DC
  Administered 2021-07-06 – 2021-07-07 (×2): 2 g via INTRAVENOUS
  Filled 2021-07-06 (×3): qty 20

## 2021-07-06 MED ORDER — ALTEPLASE 2 MG IJ SOLR
2.0000 mg | Freq: Once | INTRAMUSCULAR | Status: AC
  Administered 2021-07-07: 2 mg
  Filled 2021-07-06: qty 2

## 2021-07-06 NOTE — Consult Note (Addendum)
PHARMACY - TOTAL PARENTERAL NUTRITION CONSULT NOTE   Indication: Fistula  Patient Measurements: Height: 5' 5.98" (167.6 cm) Weight: 67.7 kg (149 lb 4 oz) IBW/kg (Calculated) : 63.76 TPN AdjBW (KG): 74.4 Body mass index is 24.1 kg/m.  Assessment:  Patient is an 80 y/o M with medical history including diabetes, HTN, colon cancer with lung metastases, recent history of admission at OSH for cholecystitis s/p cholecystectomy c/b post-op sepsis. Patient underwent ExLap with findings of small bowel injury which was repaired. 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 He is now presenting to the hospital for ileostomy leakage. Imaging notable for multiple intra-abdominal abscess with contrast extravasation to midline of abdominal wound. General surgery is following.   Pharmacy consulted to continue home TPN while patient is admitted.  8/26 evening - pt delirious and pulled PICC - nutrition changed to D10 @ 75 pending PICC replacement  Glucose / Insulin: History of controlled diabetes (last Hgb A1c 6.9%).   on SSI q4h (moderate): 42 units/last 24 hrs Novolog coverage 7 units q4hours (total Novolog 42 units/24 hours)  Two doses of Lantus 20 units on 8/26 (total Lantus 40 units / 24 hours)  Electrolytes: Hyponatremia (resolved 136), Potassium improved (5.2>4.5>4.7) Renal: Scr < 1 Hepatic: LFTs within normal limits Intake / Output; MIVF: D10 @ 75, Net ~ + 2L since 8/17 GI Imaging: 8/17 CT abdomen / pelvis: Large ventral wound containing small amount of contrast. Enterocutaneous fistula. Multiple intra-abdominal abscess(s). Mesenteric inflammation. Small bilateral pleural effusions with atelectasis or pneumonia at the bases. Metastatic lung nodules, slight increased size of right lower lobe metastatic nodule GI Surgeries / Procedures:  History of recent cholecystectomy, ExLap at OSH in 05/2021  Central access:  POA TPN start date: 8/18   Outpatient TPN information: [facility] in [place], [place] Ph: [phone]  Nutritional Goals: Goal TPN rate is 83 mL/hr (provides 105.6 g of protein and 2262 kcals per day)  RD Assessment: Estimated Needs Total Energy Estimated Needs: 2000-2300kcal/day Total Protein Estimated Needs: 100-115g/day Total Fluid Estimated Needs: 2.0-2.3L/day  Current Nutrition:  TPN  Plan:  Ideally continue 42 mL/hr per dietician recommendations pending PICC replacement (per discussion with nursing and attending/TRH this will be attempted today). Patient has been drinking Ensures over the past day and is beginning to eat meals - but still inconsistent per RD. Nutritional components: Amino acids (Travasol 10%): 53.42 grams Lipids (20% SMOF): 34.8 grams Dextrose: 171.36 grams Electrolytes in TPN (standard): Na 63mEq/L(increase Na from 55 meq/L to 60 meq/L on 8/21), KCl 15 mEq/L, Ca 5 mEq/L, Mg 90mEq/L, and Phos 26mmol/L. Cl:Ac 1:1 On 8/24, added Kcl 25 mEq in bag, K increased to 5.2 on 8/25, therefore Kcl was removed from TPN on 8/25. Then, on 8.26, K decreased to 4.5. Now adding back Kcl 15 meq. Add standard MVI and trace elements to TPN MD reduced Lantus from 20 units BID to 10 u bid and continued Novolog 7 units q4 hr + SSI - will monitor carefully given changing caloric supply situation (TPN vs D10) Monitor TPN labs on Mon/Thurs, daily until stable F/u labs am 8/28   Lu Duffel, PharmD, BCPS Clinical Pharmacist 07/06/2021 7:21 AM

## 2021-07-06 NOTE — Progress Notes (Signed)
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  He did had evidence of metastatic lung disease but is was able to be controlled with chemotherapy.  He presented to [facility] 6 weeks ago for acute cholecystitis and underwent laparoscopic cholecystectomy on May 16, 2021.  His hospitalization was complicated by missed bowel injury requiring exploratory laparotomy and multiple interventions.  Eventually the patient developed enterocutaneous fistula and was in the hospital for over 4 weeks.  Now he presents with issues regarding enterocutaneous fistula and the [place] pouch leaking. I personally reviewed the CT scan and there is evidence of a small collections that given his primary pathology, general surgery will not attempt any percutaneous drainage.  This may lead to more complications given his EC fistula and friable bowel.   Patient admitted 06/26/2021 for problems with fistula and bag catching the stool.  Wound care has been able to get a bag that is doing the symptoms and catching his stool and only leaking intermittently.  The patient does have a cough and has a little interstitial edema and bilateral pleural effusions and that improved a little bit with IV Lasix and IV albumin.  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 [name] as outpatient.  Disposition and become very challenging.  The family is unable to provide the care that they feel the patient needs for home.  TPN is the major barrier to finding a accepting facility.  Per TOC she is reached out to 40+ facilities without an accepting one with the majority claiming that TPN is the barrier to admission.  I discussed this at length with patient and patient's son [name] on phone.  Current plan of  care is to attempt to wean off of TPN.  If we are able to do so and patient can meet the majority of his nutritional needs we can get him to a skilled nursing facility and he can seek surgical follow-up at tertiary care center.  As of 8/26 patient is starting to eat more.  We are tapering TPN.  Current rate 83 mL/h.  Taper down to 42 mL/h  8/27: Patient became altered and delirious last night.  Hospital-acquired delirium versus urinary tract infection.  Unfortunately patient self discontinued his PICC line and pulled his ileostomy.  Ileostomy is in place and functioning appropriately.  After discussion with the PICC team and the IV nurse and the bedside RN decision was made to heparinize and access patient's implanted port and administer TPN via this route.  This will avoid placement of another PICC line and will allow [place] to continue parenteral nutrition.   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[place]) Active Problems:   Essential hypertension   Type 2 diabetes mellitus with hyperlipidemia (HCC)   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HCC)   Lung metastases (HCC)   Post cholecystectomy intraabdominal abscess   S/P cholecystectomy   On total parenteral nutrition (TPN)   Malnutrition of moderate degree   Overweight (BMI 25.0-29.9)   Enterocutaneous fistula   Anemia of chronic disease   Hyponatremia   Cough   Pleural effusion   Acute metabolic encephalopathy   Palliative care encounter  Enterocutaneous fistula Multiple fluid collections on CT abdomen Extensive and complex surgical history Majority of care at outside facility  in [place] surgery consulted here They will facilitate outpatient evaluation and referral to tertiary care facility however will be 6 months before any intervention Currently no recommendations of intervention on the abdomen Plan: Ostomy care per nursing and W OC  Moderate protein calorie malnutrition Currently on TPN.  Patient doing  better with oral intake TPN weaned to half rate as of 8/26 Unfortunately patient pulled out PICC line on 8/26 Port accessed and TPN to be administered via this route Plan: Continue calorie count Continue maximizing oral intake Attempt to wean off TPN entirely within the next few days  Urinary tract infection Repeat urinalysis on 8/26 nitrite positive Unclear whether this represents true infection but will treat as such given the encephalopathy Plan: Rocephin 2 g every 24 hours Add on urine culture Monitor vitals and fever curve  Acute metabolic encephalopathy Baseline is unclear however concern the patient was confused.   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 [name] as outpatient No active oncology issues however palliative care nurse practitioner [name] was consulted for goals of care At this time patient and family not interested in de-escalation of care Outpatient follow-up  Hyponatremia Likely secondary to malnutrition and poor p.o. 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  At this point unclear to me whether patient would be safe at home environment.  I spoke at length to his son [name] and the patient lives alone and prior to his hospitalization at [facility] was apparently robust and fully independent.  There is been a significant decline in his functional status and at this point0 I am not sure that  home environment would be safe disposition and would likely result in readmission.  At this point TPN is the major barrier to skilled nursing facility placement so we will attempt to wean from TPN if the patient is able to meet some of his nutritional goals.  Patient has been trying to eat more and has been receptive to encouragement from nursing staff    DVT prophylaxis: SQ Lovenox Code Status: Full Family Communication: [name] [phone] on 8/24, 8/25, 8/27 Disposition Plan: Status is: Inpatient  Remains inpatient appropriate because:Inpatient level of care appropriate due to severity of illness  Dispo: The patient is from: Home              Anticipated d/c is to:  TBD              Patient currently is not medically stable to DC   Difficult to place patient Yes  Patient is not medically stable for discharge.  His nutritional status is poor and we are attempting to wean from TPN in order to secure a safe disposition plan to skilled nursing facility.  At this point in my medical opinion I feel that disposition home is not safe.  He has a very complex surgical abdomen and an ostomy that frequently leaks and requires specific attention and care.     Level of care: Med-Surg  Consultants:  General surgery  Procedures:  None  Antimicrobials:  None   Subjective: Seen and examined.  Sleeping this morning after receiving sedative medication last night.  Objective: Vitals:   07/05/21 1539 07/05/21 1957 07/06/21 0400 07/06/21 0854  BP: (!) 142/63 (!) 121/59 128/60 (!) 108/55  Pulse: 97 99  80  Resp: 18 20  18   Temp: 97.6 F (36.4 C) 97.9 F (36.6 C) 97.9 F (36.6 C) 98 F (36.7 C)  TempSrc: Oral Oral    SpO2: 99% 99%  100%  Weight:      Height:        Intake/Output Summary (Last 24 hours) at 07/06/2021 1216 Last data filed at 07/06/2021 0900 Gross per 24 hour  Intake 25 ml  Output 100 ml  Net -75 ml   Filed Weights   07/03/21 1315 07/04/21 0500 07/05/21 0500   Weight: 71.6 kg 68.5 kg 67.7 kg    Examination:  General exam: Sleeping.  No acute distress Respiratory system: Bibasilar crackles.  Normal work of breathing.  Room air Cardiovascular system: S1-S2, regular rate and rhythm, no murmurs, no pedal edema  gastrointestinal system: Thin, nontender, nondistended, normal bowel sounds Central nervous system: Awake and alert, no focal deficits Extremities: Symmetrically decreased power bilaterally upper and lower extremities Skin: Pale with scattered excoriations Psychiatry: Judgement and insight appear impaired. Mood & affect flattened.     Data Reviewed: I have personally reviewed following labs and imaging studies  CBC: Recent Labs  Lab 07/01/21 0546 07/05/21 0103 07/05/21 0455 07/05/21 1218 07/05/21 1804 07/06/21 0002 07/06/21 0459  WBC 8.4  --   --   --   --   --   --   NEUTROABS 5.0  --   --   --   --   --   --   HGB 9.0*   < > 9.5* 10.3* 10.0* 9.8* 10.3*  HCT 29.1*   < > 30.4* 33.5* 32.4* 31.2* 33.3*  MCV 87.1  --   --   --   --   --   --   PLT 308  --   --   --   --   --   --    < > = values in this interval not displayed.   Basic Metabolic Panel: Recent Labs  Lab 06/30/21 0611 07/01/21 0546 07/02/21 0500 07/03/21 0808 07/04/21 0508 07/05/21 0455 07/06/21 0459  NA 130* 130* 131* 133* 130* 133* 136  K 4.7 5.1 4.6 4.8 5.2* 4.5 4.7  CL 102 103 100 99 99 105 110  CO2 25 25 20* 23 22 20* 20*  GLUCOSE 174* 227* 256* 286* 342* 233* 133*  BUN 24* 33* 46* 54* 60* 77* 86*  CREATININE 0.64 0.80 0.88 0.96 0.89 0.86 0.97  CALCIUM 8.9 9.1 9.5 10.0 10.0 10.3 10.0  MG 1.9 1.9  --   --  2.0  --   --   PHOS 3.8 4.1  --   --  3.0  --   --    GFR: Estimated Creatinine Clearance: 53.9 mL/min (by C-G formula based on SCr of 0.97 mg/dL). Liver Function Tests: Recent Labs  Lab 07/01/21 0546 07/04/21 0508  AST 25 35  ALT 25 35  ALKPHOS 175* 184*  BILITOT 0.5 0.6  PROT 7.1 8.4*  ALBUMIN 2.4* 3.4*   No results for  input(s): LIPASE, AMYLASE in the last 168 hours. No results for input(s): AMMONIA in the last 168 hours. Coagulation Profile: No results for  input(s): INR, PROTIME in the last 168 hours. Cardiac Enzymes: No results for input(s): CKTOTAL, CKMB, CKMBINDEX, TROPONINI in the last 168 hours. BNP (last 3 results) No results for input(s): PROBNP in the last 8760 hours. HbA1C: No results for input(s): HGBA1C in the last 72 hours. CBG: Recent Labs  Lab 07/05/21 1955 07/06/21 0008 07/06/21 0442 07/06/21 0900 07/06/21 1141  GLUCAP 256* 271* 102* 325* 110*   Lipid Profile: No results for input(s): CHOL, HDL, LDLCALC, TRIG, CHOLHDL, LDLDIRECT in the last 72 hours.  Thyroid Function Tests: No results for input(s): TSH, T4TOTAL, FREET4, T3FREE, THYROIDAB in the last 72 hours. Anemia Panel: No results for input(s): VITAMINB12, FOLATE, FERRITIN, TIBC, IRON, RETICCTPCT in the last 72 hours. Sepsis Labs: No results for input(s): PROCALCITON, LATICACIDVEN in the last 168 hours.   Recent Results (from the past 240 hour(s))  Resp Panel by RT-PCR (Flu A&B, Covid) Nasopharyngeal Swab     Status: None   Collection Time: 06/26/21  7:31 PM   Specimen: Nasopharyngeal Swab; Nasopharyngeal(NP) swabs in vial transport medium  Result Value Ref Range Status   SARS Coronavirus 2 by RT PCR NEGATIVE NEGATIVE Final    Comment: (NOTE) SARS-CoV-2 target nucleic acids are NOT DETECTED.  The SARS-CoV-2 RNA is generally detectable in upper respiratory specimens during the acute phase of infection. The lowest concentration of SARS-CoV-2 viral copies this assay can detect is 138 copies/mL. A negative result does not preclude SARS-Cov-2 infection and should not be used as the sole basis for treatment or other patient management decisions. A negative result may occur with  improper specimen collection/handling, submission of specimen other than nasopharyngeal swab, presence of viral mutation(s) within the areas  targeted by this assay, and inadequate number of viral copies(<138 copies/mL). A negative result must be combined with clinical observations, patient history, and epidemiological information. The expected result is Negative.  Fact Sheet for Patients:  EntrepreneurPulse.com.au  Fact Sheet for Healthcare Providers:  IncredibleEmployment.be  This test is no t yet approved or cleared by the [place] FDA and  has been authorized for detection and/or diagnosis of SARS-CoV-2 by FDA under an Emergency Use Authorization (EUA). This EUA will remain  in effect (meaning this test can be used) for the duration of the COVID-19 declaration under Section 564(b)(1) of the Act, 21 U.S.C.section 360bbb-3(b)(1), unless the authorization is terminated  or revoked sooner.       Influenza A by PCR NEGATIVE NEGATIVE Final   Influenza B by PCR NEGATIVE NEGATIVE Final    Comment: (NOTE) The Xpert Xpress SARS-CoV-2/FLU/RSV plus assay is intended as an aid in the diagnosis of influenza from Nasopharyngeal swab specimens and should not be used as a sole basis for treatment. Nasal washings and aspirates are unacceptable for Xpert Xpress SARS-CoV-2/FLU/RSV testing.  Fact Sheet for Patients: EntrepreneurPulse.com.au  Fact Sheet for Healthcare Providers: IncredibleEmployment.be  This test is not yet approved or cleared by the [place] FDA and has been authorized for detection and/or diagnosis of SARS-CoV-2 by FDA under an Emergency Use Authorization (EUA). This EUA will remain in effect (meaning this test can be used) for the duration of the COVID-19 declaration under Section 564(b)(1) of the Act, 21 U.S.C. section 360bbb-3(b)(1), unless the authorization is terminated or revoked.  Performed at [facility], [address], [place], [place] [address]          Radiology Studies: DG Humerus Right  Result  Date: 07/06/2021 CLINICAL DATA:  Foreign body, missing PICC line tip EXAM: RIGHT  HUMERUS - 2+ VIEW COMPARISON:  None. FINDINGS: No fracture or dislocation is seen. Degenerative changes of the acromioclavicular joint. Visualized soft tissues are within normal limits. No radiopaque foreign body is seen. IMPRESSION: No radiopaque foreign body is seen. Electronically Signed   By: Julian Hy M.D.   On: 07/06/2021 03:23   [place] EKG SITE RITE  Result Date: 07/06/2021 If [facility] image not attached, placement could not be confirmed due to current cardiac rhythm.       Scheduled Meds:  alteplase  2 mg Intracatheter Once   aspirin EC  81 mg Oral Daily   benzonatate  200 mg Oral TID   Chlorhexidine Gluconate Cloth  6 each Topical Daily   dexamethasone  4 mg Oral Daily   feeding supplement  237 mL Oral TID BM   furosemide  20 mg Oral Daily   insulin aspart  0-15 Units Subcutaneous Q4H   insulin aspart  7 Units Subcutaneous Q4H   insulin glargine-yfgn  10 Units Subcutaneous BID   loratadine  10 mg Oral Daily   losartan  25 mg Oral Daily   mirtazapine  15 mg Oral QHS   pregabalin  75 mg Oral BID   sodium chloride flush  10-40 mL Intracatheter Q12H   Continuous Infusions:  TPN ADULT (ION) Stopped (07/05/21 1930)   TPN ADULT (ION)       LOS: 10 days    Time spent: 25 minutes    Sidney Ace, MD [facility] Pager [phone]  If 7PM-7AM, please contact night-coverage 07/06/2021, 12:16 PM

## 2021-07-06 NOTE — TOC Progression Note (Signed)
Transition of Care [facility]) - Progression Note    Patient Details  Name: BURNARD ENIS MRN: [id number] Date of Birth: [birth year]  Transition of Care [facility]) CM/SW [place], LCSW Phone Number: 07/06/2021, 10:44 AM  Clinical Narrative:   Patient currently has no SNF bed offers. Will resend SNF referral when patient is weaned off of TPN.    Expected Discharge Plan:  (TBD) Barriers to Discharge: Continued Medical Work up  Expected Discharge Plan and Services Expected Discharge Plan:  (TBD)     Post Acute Care Choice:  (TBD) Living arrangements for the past 2 months: Single Family Home                                       Social Determinants of Health (SDOH) Interventions    Readmission Risk Interventions No flowsheet data found.

## 2021-07-07 LAB — BASIC METABOLIC PANEL
Anion gap: 9 (ref 5–15)
BUN: 76 mg/dL — ABNORMAL HIGH (ref 8–23)
CO2: 20 mmol/L — ABNORMAL LOW (ref 22–32)
Calcium: 9.7 mg/dL (ref 8.9–10.3)
Chloride: 107 mmol/L (ref 98–111)
Creatinine, Ser: 1 mg/dL (ref 0.61–1.24)
GFR, Estimated: >60 mL/min (ref >60)
Glucose, Bld: 103 mg/dL — ABNORMAL HIGH (ref 70–99)
Potassium: 4.2 mmol/L (ref 3.5–5.1)
Sodium: 136 mmol/L (ref 135–145)

## 2021-07-07 LAB — GLUCOSE, CAPILLARY
Glucose-Capillary: 110 mg/dL — ABNORMAL HIGH (ref 70–99)
Glucose-Capillary: 111 mg/dL — ABNORMAL HIGH (ref 70–99)
Glucose-Capillary: 121 mg/dL — ABNORMAL HIGH (ref 70–99)
Glucose-Capillary: 174 mg/dL — ABNORMAL HIGH (ref 70–99)
Glucose-Capillary: 268 mg/dL — ABNORMAL HIGH (ref 70–99)
Glucose-Capillary: 277 mg/dL — ABNORMAL HIGH (ref 70–99)
Glucose-Capillary: 294 mg/dL — ABNORMAL HIGH (ref 70–99)

## 2021-07-07 LAB — HEMOGLOBIN AND HEMATOCRIT, BLOOD
HCT: 34 % — ABNORMAL LOW (ref 39.0–52.0)
HCT: 37.7 % — ABNORMAL LOW (ref 39.0–52.0)
Hemoglobin: 10.4 g/dL — ABNORMAL LOW (ref 13.0–17.0)
Hemoglobin: 11.6 g/dL — ABNORMAL LOW (ref 13.0–17.0)

## 2021-07-07 MED ORDER — TRAVASOL 10 % IV SOLN
INTRAVENOUS | Status: AC
  Filled 2021-07-07: qty 534.2

## 2021-07-07 NOTE — Progress Notes (Signed)
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  He did had evidence of metastatic lung disease but is was able to be controlled with chemotherapy.  He presented to [facility] 6 weeks ago for acute cholecystitis and underwent laparoscopic cholecystectomy on May 16, 2021.  His hospitalization was complicated by missed bowel injury requiring exploratory laparotomy and multiple interventions.  Eventually the patient developed enterocutaneous fistula and was in the hospital for over 4 weeks.  Now he presents with issues regarding enterocutaneous fistula and the Eakin pouch leaking. I personally reviewed the CT scan and there is evidence of a small collections that given his primary pathology, general surgery will not attempt any percutaneous drainage.  This may lead to more complications given his EC fistula and friable bowel.   Patient admitted 06/26/2021 for problems with fistula and bag catching the stool.  Wound care has been able to get a bag that is doing the symptoms and catching his stool and only leaking intermittently.  The patient does have a cough and has a little interstitial edema and bilateral pleural effusions and that improved a little bit with IV Lasix and IV albumin.  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 [name] as outpatient.  Disposition and become very challenging.  The family is unable to provide the care that they feel the patient needs for home.  TPN is the major barrier to finding a accepting facility.  Per TOC she is reached out to 40+ facilities without an accepting one with the majority claiming that TPN is the barrier to admission.  I discussed this at length with patient and patient's son [name] on phone.  Current plan of  care is to attempt to wean off of TPN.  If we are able to do so and patient can meet the majority of his nutritional needs we can get him to a skilled nursing facility and he can seek surgical follow-up at tertiary care center.  As of 8/26 patient is starting to eat more.  We are tapering TPN.  Current rate 83 mL/h.  Taper down to 42 mL/h  8/27: Patient became altered and delirious last night.  Hospital-acquired delirium versus urinary tract infection.  Unfortunately patient self discontinued his PICC line and pulled his ileostomy.  Ileostomy is in place and functioning appropriately.  After discussion with the PICC team and the IV nurse and the bedside RN decision was made to heparinize and access patient's implanted port and administer TPN via this route.  This will avoid placement of another PICC line and will allow [place] to continue parenteral nutrition.   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[place]) Active Problems:   Essential hypertension   Type 2 diabetes mellitus with hyperlipidemia (HCC)   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HCC)   Lung metastases (HCC)   Post cholecystectomy intraabdominal abscess   S/P cholecystectomy   On total parenteral nutrition (TPN)   Malnutrition of moderate degree   Overweight (BMI 25.0-29.9)   Enterocutaneous fistula   Anemia of chronic disease   Hyponatremia   Cough   Pleural effusion   Acute metabolic encephalopathy   Palliative care encounter  Enterocutaneous fistula Multiple fluid collections on CT abdomen Extensive and complex surgical history Majority of care at outside facility  in [place] surgery consulted here They will facilitate outpatient evaluation and referral to tertiary care facility however will be 6 months before any intervention Currently no recommendations of intervention on the abdomen Plan: Ostomy care per nursing and W OC  Moderate protein calorie malnutrition Currently on TPN.  Patient doing  better with oral intake TPN weaned to half rate as of 8/26 Unfortunately patient pulled out PICC line on 8/26 Port accessed and TPN to be administered via this route Oral intake has been improving but still not meeting nutritional goals Plan: Continue calorie count Continue maximizing oral intake Attempt to wean off TPN entirely within the next few days Instructions given to nursing staff.  Every single staff member that walks into the patient's room during the course of the day needs to encourage him to eat  Urinary tract infection Repeat urinalysis on 8/26 nitrite positive Unclear whether this represents true infection but will treat as such given the encephalopathy Urine culture with greater than 100,000 colony-forming units gram negative rods Plan: Continue Rocephin 2 g every 24 hours Monitor urine culture for pathogen identification and sensitivities Monitor vitals and fever curve  Acute metabolic encephalopathy Baseline is unclear however concern the patient was confused.   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 [name] as outpatient No active oncology issues however palliative care nurse practitioner [name] was consulted for goals of care At this time patient and family not interested in de-escalation of care Outpatient follow-up  Hyponatremia Likely secondary to malnutrition and poor p.o. 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  At this point unclear to me whether patient would be safe at home environment.  I spoke at length to his son [name] and the patient lives alone and prior to his hospitalization at [facility] was apparently robust and fully independent.  There is been a significant decline in his functional status and at this point0 I am not sure that home environment would be safe disposition and would likely result in readmission.  At this point TPN is the major barrier to skilled nursing facility placement so we will attempt to wean from TPN if the patient is able to meet some of his nutritional goals.  Patient has been trying to eat more and has been receptive to encouragement from nursing staff    DVT prophylaxis: SQ Lovenox Code Status: Full Family Communication: [name] [phone] on 8/24, 8/25, 8/27 Disposition Plan: Status is: Inpatient  Remains inpatient appropriate because:Inpatient level of care appropriate due to severity of illness  Dispo: The patient is from: Home              Anticipated d/c is to:  TBD              Patient currently is not medically stable to DC   Difficult to place patient Yes  Patient is not medically stable for discharge.  His nutritional status is poor and we are attempting to wean from TPN in order  to secure a safe disposition plan to skilled nursing facility.  At this point in my medical opinion I feel that disposition home is not safe.  He has a very complex surgical abdomen and an ostomy that frequently leaks and requires specific attention and care.     Level of care: Med-Surg  Consultants:  General surgery  Procedures:  None  Antimicrobials:  None   Subjective: Seen and examined.  More lethargic and sleepy this morning.  More confused  Objective: Vitals:   07/06/21 1658 07/06/21 1956 07/07/21 0500 07/07/21 0818  BP: (!) 113/58 (!) 114/54  130/65  Pulse: 82 89  91  Resp: 18 14  20   Temp: 97.6 F (36.4 C)  98.1 F (36.7 C)  98.4 F (36.9 C)  TempSrc: Oral Oral  Oral  SpO2: 97% 100%  100%  Weight:   67.1 kg   Height:        Intake/Output Summary (Last 24 hours) at 07/07/2021 1407 Last data filed at 07/07/2021 0630 Gross per 24 hour  Intake 143.8 ml  Output 350 ml  Net -206.2 ml   Filed Weights   07/04/21 0500 07/05/21 0500 07/07/21 0500  Weight: 68.5 kg 67.7 kg 67.1 kg    Examination:  General exam: Sleeping.  No acute distress Respiratory system: Bibasilar crackles.  Normal work of breathing.  Room air Cardiovascular system: S1-S2, regular rate and rhythm, no murmurs, no pedal edema  gastrointestinal system: Thin, nontender, nondistended, normal bowel sounds Central nervous system: Awake and alert, no focal deficits Extremities: Symmetrically decreased power bilaterally upper and lower extremities Skin: Pale with scattered excoriations Psychiatry: Judgement and insight appear impaired. Mood & affect flattened.     Data Reviewed: I have personally reviewed following labs and imaging studies  CBC: Recent Labs  Lab 07/01/21 0546 07/05/21 0103 07/06/21 0459 07/06/21 1209 07/06/21 1815 07/06/21 2359 07/07/21 0601  WBC 8.4  --   --   --   --   --   --   NEUTROABS 5.0  --   --   --   --   --   --   HGB 9.0*   < > 10.3* 10.9* 10.7* 10.4* 11.6*  HCT 29.1*   < > 33.3* 36.3* 35.8* 34.0* 37.7*  MCV 87.1  --   --   --   --   --   --   PLT 308  --   --   --   --   --   --    < > = values in this interval not displayed.   Basic Metabolic Panel: Recent Labs  Lab 07/01/21 0546 07/02/21 0500 07/03/21 0808 07/04/21 0508 07/05/21 0455 07/06/21 0459 07/07/21 0601  NA 130*   < > 133* 130* 133* 136 136  K 5.1   < > 4.8 5.2* 4.5 4.7 4.2  CL 103   < > 99 99 105 110 107  CO2 25   < > 23 22 20* 20* 20*  GLUCOSE 227*   < > 286* 342* 233* 133* 103*  BUN 33*   < > 54* 60* 77* 86* 76*  CREATININE 0.80   < > 0.96 0.89 0.86 0.97 1.00  CALCIUM 9.1   < > 10.0 10.0 10.3 10.0 9.7  MG  1.9  --   --  2.0  --   --   --   PHOS 4.1  --   --  3.0  --   --   --    < > =  values in this interval not displayed.   GFR: Estimated Creatinine Clearance: 52.3 mL/min (by C-G formula based on SCr of 1 mg/dL). Liver Function Tests: Recent Labs  Lab 07/01/21 0546 07/04/21 0508  AST 25 35  ALT 25 35  ALKPHOS 175* 184*  BILITOT 0.5 0.6  PROT 7.1 8.4*  ALBUMIN 2.4* 3.4*   No results for input(s): LIPASE, AMYLASE in the last 168 hours. No results for input(s): AMMONIA in the last 168 hours. Coagulation Profile: No results for input(s): INR, PROTIME in the last 168 hours. Cardiac Enzymes: No results for input(s): CKTOTAL, CKMB, CKMBINDEX, TROPONINI in the last 168 hours. BNP (last 3 results) No results for input(s): PROBNP in the last 8760 hours. HbA1C: No results for input(s): HGBA1C in the last 72 hours. CBG: Recent Labs  Lab 07/06/21 2152 07/06/21 2354 07/07/21 0623 07/07/21 0819 07/07/21 1157  GLUCAP 206* 174* 111* 110* 121*   Lipid Profile: No results for input(s): CHOL, HDL, LDLCALC, TRIG, CHOLHDL, LDLDIRECT in the last 72 hours.  Thyroid Function Tests: No results for input(s): TSH, T4TOTAL, FREET4, T3FREE, THYROIDAB in the last 72 hours. Anemia Panel: No results for input(s): VITAMINB12, FOLATE, FERRITIN, TIBC, IRON, RETICCTPCT in the last 72 hours. Sepsis Labs: No results for input(s): PROCALCITON, LATICACIDVEN in the last 168 hours.   Recent Results (from the past 240 hour(s))  Urine Culture     Status: Abnormal (Preliminary result)   Collection Time: 07/05/21  4:25 PM   Specimen: Urine, Random  Result Value Ref Range Status   Specimen Description   Final    URINE, RANDOM Performed at [facility], [address] [place], [place] [address]    Special Requests   Final    NONE Performed at [facility], [address], [place], [place] [address]    Culture (A)  Final    >=100,000 COLONIES/mL Lonell Grandchild NEGATIVE RODS SUSCEPTIBILITIES  TO FOLLOW Performed at [facility], [place] [address] [place], [place] [address]    Report Status PENDING  Incomplete         Radiology Studies: DG Humerus Right  Result Date: 07/06/2021 CLINICAL DATA:  Foreign body, missing PICC line tip EXAM: RIGHT HUMERUS - 2+ VIEW COMPARISON:  None. FINDINGS: No fracture or dislocation is seen. Degenerative changes of the acromioclavicular joint. Visualized soft tissues are within normal limits. No radiopaque foreign body is seen. IMPRESSION: No radiopaque foreign body is seen. Electronically Signed   By: Julian Hy M.D.   On: 07/06/2021 03:23   [place] EKG SITE RITE  Result Date: 07/06/2021 If [facility] image not attached, placement could not be confirmed due to current cardiac rhythm.       Scheduled Meds:  alteplase  2 mg Intracatheter Once   aspirin EC  81 mg Oral Daily   benzonatate  200 mg Oral TID   Chlorhexidine Gluconate Cloth  6 each Topical Daily   dexamethasone  4 mg Oral Daily   feeding supplement  237 mL Oral TID BM   furosemide  20 mg Oral Daily   insulin aspart  0-15 Units Subcutaneous Q4H   insulin aspart  7 Units Subcutaneous Q4H   insulin glargine-yfgn  10 Units Subcutaneous BID   loratadine  10 mg Oral Daily   losartan  25 mg Oral Daily   mirtazapine  15 mg Oral QHS   pregabalin  75 mg Oral BID   sodium chloride flush  10-40 mL Intracatheter Q12H   Continuous Infusions:  cefTRIAXone (ROCEPHIN)  IV 2 g (07/07/21 1310)   TPN ADULT (ION) 42 mL/hr at 07/06/21 1801   TPN ADULT (ION)       LOS: 11 days    Time spent: 25 minutes    Sidney Ace, MD [facility] Pager [phone]  If 7PM-7AM, please contact night-coverage 07/07/2021, 2:07 PM

## 2021-07-07 NOTE — Progress Notes (Signed)
Family mentioned that Mr. Miguel Flores started showing signs of a UTI: confusion; grabbing and agitation. Stated that they want a urine culture on patient. Ostomy is leaking. Will change according to orders.

## 2021-07-07 NOTE — Progress Notes (Signed)
Consulted to assess PAC. It was reported the line was not flushing, and the RN stopped the TPN infusion. Attempted to flush, however, the line is very hard to flush with NBR. Alteplase 2 mg instilled per protocol and dead end cap placed to line. Primary RN aware. Spoke to [place], pharmacist to report the TPN infusion stopped and disconnected due to TPA. Was instructed to re-hang the TPN after TPA removed and GBR is established. Reported to Ray, RN VAST.

## 2021-07-07 NOTE — Consult Note (Signed)
PHARMACY - TOTAL PARENTERAL NUTRITION CONSULT NOTE   Indication: Fistula  Patient Measurements: Height: 5' 5.98" (167.6 cm) Weight: 67.1 kg (147 lb 14.9 oz) IBW/kg (Calculated) : 63.76 TPN AdjBW (KG): 74.4 Body mass index is 23.89 kg/m.  Assessment:  Patient is an 80 y/o M with medical history including diabetes, HTN, colon cancer with lung metastases, recent history of admission at OSH for cholecystitis s/p cholecystectomy c/b post-op sepsis. Patient underwent ExLap with findings of small bowel injury which was repaired. 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 He is now presenting to the hospital for ileostomy leakage. Imaging notable for multiple intra-abdominal abscess with contrast extravasation to midline of abdominal wound. General surgery is following.   Pharmacy consulted to continue home TPN while patient is admitted.  8/26 evening - pt delirious and pulled PICC - nutrition changed to D10 @ 75 pending PICC replacement  8/27 evening - TPN restarted using port per discussion with the PICC team and the IV nurse and the bedside RN decision was made to heparinize and access patient's implanted port and administer TPN via this route  Glucose / Insulin: History of controlled diabetes (last Hgb A1c 6.9%).    Novolog coverage 7 units q4hours (several doses held) + SSI q4  (total Novolog 40 units/24 hours)  Two doses of Lantus 10 units on 8/27 (total Lantus 20 units / 24 hours)  Electrolytes: Hyponatremia (resolved 136), Potassium improved (5.2>4.5>4.7>4.2) Renal: Scr = 1 Hepatic: LFTs within normal limits Intake / Output; MIVF: D10 @ 75, Net ~ + 1.6L since 8/17  GI Imaging: 8/17 CT abdomen / pelvis: Large ventral wound containing small amount of contrast. Enterocutaneous fistula. Multiple intra-abdominal abscess(s). Mesenteric inflammation. Small bilateral pleural effusions with atelectasis or  pneumonia at the bases. Metastatic lung nodules, slight increased size of right lower lobe metastatic nodule  GI Surgeries / Procedures:  History of recent cholecystectomy, ExLap at OSH in 05/2021  Central access: Port (see note above) TPN start date: 8/18   Outpatient TPN information: [facility] in [place], [place] Ph: [phone]  Nutritional Goals: Goal TPN rate was 83 mL/hr (provides 105.6 g of protein and 2262 kcals per day) - now weaning  RD Assessment: Estimated Needs Total Energy Estimated Needs: 2000-2300kcal/day Total Protein Estimated Needs: 100-115g/day Total Fluid Estimated Needs: 2.0-2.3L/day  Current Nutrition:  TPN  Plan:  Continue 42 mL/hr per dietician recommendations/weaning plan  Patient has been drinking Ensures over the past day and is beginning to eat meals - but still inconsistent per RD. Nutritional components: Amino acids (Travasol 10%): 53.42 grams Lipids (20% SMOF): 34.8 grams Dextrose: 171.36 grams Electrolytes in TPN (standard): Na 62mEq/L, KCl 15 mEq/L, Ca 5 mEq/L, Mg 49mEq/L, and Phos 65mmol/L. Cl:Ac 1:1 Add standard MVI and trace elements to TPN Continue Lantus 10 u bid and continue Novolog 7 units q4 hr + SSI - will monitor carefully, and hold when needed Monitor TPN labs on Mon/Thurs, daily until stable   Lu Duffel, PharmD, BCPS Clinical Pharmacist 07/07/2021 7:13 AM

## 2021-07-08 LAB — COMPREHENSIVE METABOLIC PANEL
ALT: 51 U/L — ABNORMAL HIGH (ref 0–44)
AST: 28 U/L (ref 15–41)
Albumin: 3.3 g/dL — ABNORMAL LOW (ref 3.5–5.0)
Alkaline Phosphatase: 168 U/L — ABNORMAL HIGH (ref 38–126)
Anion gap: 8 (ref 5–15)
BUN: 74 mg/dL — ABNORMAL HIGH (ref 8–23)
CO2: 19 mmol/L — ABNORMAL LOW (ref 22–32)
Calcium: 9.5 mg/dL (ref 8.9–10.3)
Chloride: 103 mmol/L (ref 98–111)
Creatinine, Ser: 1.11 mg/dL (ref 0.61–1.24)
GFR, Estimated: >60 mL/min (ref >60)
Glucose, Bld: 251 mg/dL — ABNORMAL HIGH (ref 70–99)
Potassium: 4.6 mmol/L (ref 3.5–5.1)
Sodium: 130 mmol/L — ABNORMAL LOW (ref 135–145)
Total Bilirubin: 0.5 mg/dL (ref 0.3–1.2)
Total Protein: 7.9 g/dL (ref 6.5–8.1)

## 2021-07-08 LAB — GLUCOSE, CAPILLARY
Glucose-Capillary: 243 mg/dL — ABNORMAL HIGH (ref 70–99)
Glucose-Capillary: 233 mg/dL — ABNORMAL HIGH (ref 70–99)
Glucose-Capillary: 229 mg/dL — ABNORMAL HIGH (ref 70–99)
Glucose-Capillary: 257 mg/dL — ABNORMAL HIGH (ref 70–99)
Glucose-Capillary: 276 mg/dL — ABNORMAL HIGH (ref 70–99)
Glucose-Capillary: 251 mg/dL — ABNORMAL HIGH (ref 70–99)

## 2021-07-08 LAB — URINE CULTURE: Culture: >=100000 — AB

## 2021-07-08 LAB — TRIGLYCERIDES: Triglycerides: 62 mg/dL (ref <150)

## 2021-07-08 LAB — PHOSPHORUS: Phosphorus: 4.5 mg/dL (ref 2.5–4.6)

## 2021-07-08 LAB — MAGNESIUM: Magnesium: 2.2 mg/dL (ref 1.7–2.4)

## 2021-07-08 MED ORDER — TRAVASOL 10 % IV SOLN
INTRAVENOUS | Status: AC
  Filled 2021-07-08: qty 534.2

## 2021-07-08 MED ORDER — INSULIN GLARGINE-YFGN 100 UNIT/ML ~~LOC~~ SOLN
12.0000 [IU] | Freq: Two times a day (BID) | SUBCUTANEOUS | Status: DC
  Administered 2021-07-08 – 2021-07-17 (×19): 12 [IU] via SUBCUTANEOUS
  Filled 2021-07-08 (×21): qty 0.12

## 2021-07-08 MED ORDER — CIPROFLOXACIN IN D5W 400 MG/200ML IV SOLN
400.0000 mg | Freq: Two times a day (BID) | INTRAVENOUS | Status: AC
  Administered 2021-07-08 – 2021-07-14 (×14): 400 mg via INTRAVENOUS
  Filled 2021-07-08 (×14): qty 200

## 2021-07-08 MED ORDER — INSULIN ASPART 100 UNIT/ML IJ SOLN
10.0000 [IU] | INTRAMUSCULAR | Status: DC
  Administered 2021-07-08 – 2021-07-14 (×28): 10 [IU] via SUBCUTANEOUS
  Filled 2021-07-08 (×26): qty 1

## 2021-07-08 NOTE — Progress Notes (Signed)
Inpatient Diabetes Program Recommendations  AACE/ADA: New Consensus Statement on Inpatient Glycemic Control (2015)  Target Ranges:  Prepandial:   less than 140 mg/dL      Peak postprandial:   less than 180 mg/dL (1-2 hours)      Critically ill patients:  140 - 180 mg/dL   Results for Miguel Flores, Miguel Flores (MRN [id number]) as of 07/08/2021 09:43  Ref. Range 07/08/2021 01:44 07/08/2021 04:59 07/08/2021 07:42  Glucose-Capillary Latest Ref Range: 70 - 99 mg/dL 243 (H)  12 units Novolog  233 (H)  12 units Novolog  229 (H)  12 units Novolog  10 units Semglee   Home DM Meds: Amaryl 2 mg BID                             Metformin 1000 mg AM/ 1500 mg PM                             Trulicity 1.5 mg Qweek     Current Orders: Semglee 10 units BID                            Novolog Moderate Correction Scale/ SSI (0-15 units) Q4 hours                            Novolog 7 units Q4 hours       --Getting TPN 42cc/hr (NO Insulin in the TPN)   --Pt getting Novolog Q4 hour (Set dose) for coverage of the TPN   --Also getting Ensure Enlive PO supps TID between meals + Dysphagic PO diet  --Decadron 4 mg Daily     MD- Please consider the following while pt remains on Decadron and TPN:  1. Increase Semglee slightly to 12 units BID  2. Increase Novolog (set dose) to 10 units Q4 hours  (Make sure to reduce this dose once TPN starts to wean)     --Will follow patient during hospitalization--  Wyn Quaker RN, MSN, CDE Diabetes Coordinator Inpatient Glycemic Control Team Team Pager: [phone] (8a-5p)

## 2021-07-08 NOTE — Progress Notes (Signed)
Occupational Therapy Treatment Patient Details Name: Miguel Flores MRN: [id number] DOB: 1941-02-29 Today's Date: 07/08/2021    History of present illness 80 y.o. 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 [name] who presented with draining enterocutaneous fistula.     Patient has a complicated recent abdominal surgery history (see Dr. Deniece Ree note from 8/17) that began 6 weeks ago with cholecystitis at a hospital in [facility].  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  He ultimately went home, initially doing well and then started having issues with the ileostomy.   OT comments  Miguel Flores was seen for OT treatment on this date. Upon arrival to room pt sleeping soundly in bed, arouses to voice. Pt notified of breakfast tray at bedside and required encouragement for OOB to chair. Pt requires MIN A + HHA for bed>chair t/f. SETUP + MIN cues self-feeding seated in chair - pt requires cues to differentiate knife vs fork. Visual clutter decreased to improve visual scanning and improve PO intake. Pt making good progress toward goals. Pt continues to benefit from skilled OT services to maximize return to PLOF and minimize risk of future falls, injury, caregiver burden, and readmission. Will continue to follow POC. Discharge recommendation remains appropriate.    Follow Up Recommendations  SNF    Equipment Recommendations  Other (comment) (TBD next venue of care)    Recommendations for Other Services      Precautions / Restrictions Precautions Precautions: Fall Precaution Comments: ostomy (plus needs to wear ostomy belt) and IV's; L chest port; R PICC Restrictions Weight Bearing Restrictions: No       Mobility Bed Mobility Overal bed mobility: Needs Assistance Bed Mobility: Supine  to Sit     Supine to sit: Min assist;HOB elevated          Transfers Overall transfer level: Needs assistance Equipment used: 1 person hand held assist Transfers: Sit to/from [facility] Sit to Stand: Min assist Stand pivot transfers: Min assist            Balance Overall balance assessment: Needs assistance Sitting-balance support: No upper extremity supported;Feet supported Sitting balance-Leahy Scale: Good     Standing balance support: Single extremity supported Standing balance-Leahy Scale: Poor Standing balance comment: posterior lesn                           ADL either performed or assessed with clinical judgement   ADL Overall ADL's : Needs assistance/impaired                                       General ADL Comments: MIN A + HHA for ADL t/f. SETUP + MIN cues self-feeding seated in chair - pt requires to differentiate knife vs fork.      Cognition Arousal/Alertness: Awake/alert Behavior During Therapy: Flat affect Overall Cognitive Status: Impaired/Different from baseline Area of Impairment: Memory;Following commands;Safety/judgement                     Memory: Decreased short-term memory;Decreased recall of precautions Following Commands: Follows one step commands inconsistently Safety/Judgement: Decreased awareness of safety;Decreased awareness of deficits  Exercises Exercises: Other exercises Other Exercises Other Exercises: Pt educated re: DME recs, d/c recs, falls prevention, ECS, importance of nutrition for functiona; strengthening Other Exercises: sup>sit, sit<>stand, SPT, selff-efeding. sitting/standing balance/tolerance    Pertinent Vitals/ Pain       Pain Assessment: No/denies pain   Frequency  Min 1X/week        Progress Toward Goals  OT Goals(current goals can now be found in the care plan section)  Progress towards OT goals: Progressing toward goals  Acute  Rehab OT Goals Patient Stated Goal: to improve strength and mobility OT Goal Formulation: With family Time For Goal Achievement: 07/19/21 Potential to Achieve Goals: Fair ADL Goals Pt Will Perform Grooming: with modified independence;sitting Pt Will Perform Lower Body Dressing: with min assist;sit to/from stand;with caregiver independent in assisting Pt Will Transfer to Toilet: with supervision;ambulating;bedside commode  Plan Discharge plan remains appropriate;Frequency remains appropriate    Co-evaluation                 AM-PAC OT "6 Clicks" Daily Activity     Outcome Measure   Help from another person eating meals?: None Help from another person taking care of personal grooming?: A Little Help from another person toileting, which includes using toliet, bedpan, or urinal?: Total Help from another person bathing (including washing, rinsing, drying)?: A Little Help from another person to put on and taking off regular upper body clothing?: A Little Help from another person to put on and taking off regular lower body clothing?: A Lot 6 Click Score: 16    End of Session    OT Visit Diagnosis: Other abnormalities of gait and mobility (R26.89);Muscle weakness (generalized) (M62.81)   Activity Tolerance Patient tolerated treatment well   Patient Left in chair;with call bell/phone within reach;with chair alarm set   Nurse Communication          Time: [phone] OT Time Calculation (min): 8 min  Charges: OT General Charges $OT Visit: 1 Visit OT Treatments $Self Care/Home Management : 8-22 mins  Dessie Coma, M.S. OTR/L  07/08/21, 10:23 AM  ascom [phone]

## 2021-07-08 NOTE — Progress Notes (Signed)
PT Cancellation Note  Patient Details Name: QUAYSHAWN NIN MRN: [id number] DOB: Mar 20, 1941   Cancelled Treatment:    Reason Eval/Treat Not Completed: Other (comment).  Chart reviewed.  Nurse present assisting pt back to bed upon PT arrival (IV team present to place IV).  Will re-attempt PT session at a later date/time.  Leitha Bleak, PT 07/08/21, 11:05 AM

## 2021-07-08 NOTE — Progress Notes (Signed)
Calorie Count Note Meal intake information from 8/28  Diet: DYS 3 Supplements: Ensure Enlive TID, Magic Cup TID  Breakfast 8/28: 65 kcal, 6g of protein  Lunch: 162kcal and 5 of protein from meal, 143kcal and 7g of protein from magic cup Total for lunch: 305kcal, 12g of protein  Dinner: No recorded intake  Nutrition Supplements: 2 ensure enlive and 1/2 ensure max protein documented: 775kcal, 55g of protein  Pt with consistent intake over the weekend, continues to meet ~50% of estimated needs orally and TPN is running at 1/2 rate. Pt not consuming enough orally to meet >90% of estimated needs.   Estimated Nutritional Needs:  Kcal:  2000-2300kcal/day Protein:  100-115g/day Fluid:  2.0-2.3L/day  Total intake: 1145 kcal (57% of minimum estimated needs)  73 protein (73% of minimum estimated needs)  Nutrition Dx: Moderate Malnutrition related to cancer and cancer related treatments as evidenced by moderate fat depletion, moderate muscle depletion, severe muscle depletion.  Goal: Patient will meet greater than or equal to 90% of their needs orally prior to discharge  Intervention:  Continue current diet as ordered, encourage PO intake TPN per pharmacy Ensure Enlive po TID, each supplement provides 350 kcal and 20 grams of protein Magic cup TID with meals, each supplement provides 290 kcal and 9 grams of protein  Ranell Patrick, RD, LDN Clinical Dietitian Pager on [place]

## 2021-07-08 NOTE — Progress Notes (Signed)
Patient is alert and oriented x4 this morning. He is able to voice his name, dob, location, identify me as his nurse and the current president. Pt is given a bed bath, lines are changed and food is offered. Patient consumes 1 banana, 1 fruit cup and 29fl oz of water. He remains sitting upright in bed watching televison in no acute distress.

## 2021-07-08 NOTE — Consult Note (Signed)
PHARMACY - TOTAL PARENTERAL NUTRITION CONSULT NOTE   Indication: Fistula  Patient Measurements: Height: 5' 5.98" (167.6 cm) Weight: 72 kg (158 lb 11.7 oz) IBW/kg (Calculated) : 63.76 TPN AdjBW (KG): 74.4 Body mass index is 25.63 kg/m.  Assessment:  Patient is an 80 y/o M with medical history including diabetes, HTN, colon cancer with lung metastases, recent history of admission at OSH for cholecystitis s/p cholecystectomy c/b post-op sepsis. Patient underwent ExLap with findings of small bowel injury which was repaired. 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 He is now presenting to the hospital for ileostomy leakage. Imaging notable for multiple intra-abdominal abscess with contrast extravasation to midline of abdominal wound. General surgery is following.   Pharmacy consulted to continue home TPN while patient is admitted.  8/26 evening - pt delirious and pulled PICC - nutrition changed to D10 @ 75 pending PICC replacement  8/27 evening - TPN restarted using port per discussion with the PICC team and the IV nurse and the bedside RN decision was made to heparinize and access patient's implanted port and administer TPN via this route  Glucose / Insulin: History of controlled diabetes (last Hgb A1c 6.9%).    Novolog coverage 12 units q4hours (several doses held) + SSI q4  (total Novolog 53 units/24 hours)  Two doses of Symglee 10 units on 8/28 (total Lantus 20 units / 24 hours)  Electrolytes: Hyponatremia (130), Potassium improved (5.2>4.5>4.7>4.2>4.6) Renal: Scr = 1 Hepatic: LFTs within normal limits Intake / Output; MIVF: -364 mL since admission  GI Imaging: 8/17 CT abdomen / pelvis: Large ventral wound containing small amount of contrast. Enterocutaneous fistula. Multiple intra-abdominal abscess(s). Mesenteric inflammation. Small bilateral pleural effusions with atelectasis or pneumonia at the  bases. Metastatic lung nodules, slight increased size of right lower lobe metastatic nodule  GI Surgeries / Procedures:  History of recent cholecystectomy, ExLap at OSH in 05/2021  Central access: Port (see note above) TPN start date: 8/18   Outpatient TPN information: [facility] in [place], [place] Ph: [phone]  Nutritional Goals: Goal TPN rate was 83 mL/hr (provides 105.6 g of protein and 2262 kcals per day) - now weaning  RD Assessment: Estimated Needs Total Energy Estimated Needs: 2000-2300kcal/day Total Protein Estimated Needs: 100-115g/day Total Fluid Estimated Needs: 2.0-2.3L/day  Current Nutrition:  TPN  Plan:  Continue 42 mL/hr per dietician recommendations/weaning plan  Patient has been drinking Ensures over the past day and is beginning to eat meals - but still inconsistent per RD. Nutritional components: Amino acids (Travasol 10%): 53.42 grams Lipids (20% SMOF): 34.8 grams Dextrose: 171.36 grams Electrolytes in TPN (standard): Na 39mEq/L, KCl 15 mEq/L, Ca 5 mEq/L, Mg 83mEq/L, and Phos 72mmol/L. Cl:Ac 1:1 Add standard MVI and trace elements to TPN Increase Symglee to 12 u BID and Novolog to 12 units q4 hr + SSI - will monitor carefully, and hold when needed Monitor TPN labs on Mon/Thurs, daily until stable   Pearla Dubonnet, PharmD Clinical Pharmacist 07/08/2021 1:39 PM

## 2021-07-08 NOTE — Progress Notes (Signed)
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  He did had evidence of metastatic lung disease but is was able to be controlled with chemotherapy.  He presented to [facility] 6 weeks ago for acute cholecystitis and underwent laparoscopic cholecystectomy on May 16, 2021.  His hospitalization was complicated by missed bowel injury requiring exploratory laparotomy and multiple interventions.  Eventually the patient developed enterocutaneous fistula and was in the hospital for over 4 weeks.  Now he presents with issues regarding enterocutaneous fistula and the Eakin pouch leaking. I personally reviewed the CT scan and there is evidence of a small collections that given his primary pathology, general surgery will not attempt any percutaneous drainage.  This may lead to more complications given his EC fistula and friable bowel.   Patient admitted 06/26/2021 for problems with fistula and bag catching the stool.  Wound care has been able to get a bag that is doing the symptoms and catching his stool and only leaking intermittently.  The patient does have a cough and has a little interstitial edema and bilateral pleural effusions and that improved a little bit with IV Lasix and IV albumin.  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 [name] as outpatient.  Disposition has become very challenging.  The family is unable to provide the care that they feel the patient needs for home.  TPN is the major barrier to finding a accepting facility.  Per TOC she is reached out to 40+ facilities without an accepting one with the majority claiming that TPN is the barrier to admission.  I discussed this at length with patient and patient's son [name] on phone.  Current plan of  care is to attempt to wean off of TPN.  If we are able to do so and patient can meet the majority of his nutritional needs we can get him to a skilled nursing facility and he can seek surgical follow-up at tertiary care center.  As of 8/26 patient is starting to eat more.  We are tapering TPN.  Current rate 83 mL/h.  Taper down to 42 mL/h  8/27: Patient became altered and delirious last night.  Hospital-acquired delirium versus urinary tract infection.  Unfortunately patient self discontinued his PICC line and pulled his ileostomy.  Ileostomy is in place and functioning appropriately.  After discussion with the PICC team and the IV nurse and the bedside RN decision was made to heparinize and access patient's implanted port and administer TPN via this route.  This will avoid placement of another PICC line and will allow [place] to continue parenteral nutrition.  8/29: Patient meeting about 50 to 60% of his nutritional goals.  Needs to be 90% or greater in order for [place] to safely discontinue TPN.   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[place]) Active Problems:   Essential hypertension   Type 2 diabetes mellitus with hyperlipidemia (HCC)   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HCC)   Lung metastases (HCC)   Post cholecystectomy intraabdominal abscess   S/P cholecystectomy   On total parenteral nutrition (TPN)   Malnutrition of moderate degree   Overweight (BMI 25.0-29.9)   Enterocutaneous fistula   Anemia of chronic disease   Hyponatremia   Cough   Pleural effusion   Acute  metabolic encephalopathy   Palliative care encounter  Enterocutaneous fistula Multiple fluid collections on CT abdomen Extensive and complex surgical history Majority of care at outside facility in [facility] surgery consulted here They will facilitate outpatient evaluation and referral to tertiary care facility however will be 6 months before any intervention Currently no recommendations of  intervention on the abdomen Plan: Ostomy care per nursing and WOC  Moderate protein calorie malnutrition Currently on TPN.  Patient doing better with oral intake TPN weaned to half rate as of 8/26 Unfortunately patient pulled out PICC line on 8/26 Port accessed and TPN to be administered via this route Oral intake has been improving but still not meeting nutritional goals Plan: Continue calorie count Continue maximizing oral intake Attempt to wean off TPN entirely within the next few days Instructions given to nursing staff.   Every single staff member that walks into the patient's room during the course of the day needs to encourage him to eat  Urinary tract infection Repeat urinalysis on 8/26 nitrite positive Unclear whether this represents true infection but will treat as such given the encephalopathy Urine culture with greater than 100,000 colony-forming units gram negative rods Resistant to Rocephin Plan: DC Rocephin Start IV ciprofloxacin Plan for 7-day course Monitor vitals and fever curve  Acute metabolic encephalopathy Baseline is unclear however concern the patient was confused.   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 [name] as outpatient No active oncology issues however palliative care nurse practitioner [name] was consulted for goals of care At this time patient and family not interested in de-escalation of care Outpatient follow-up  Hyponatremia Likely secondary to malnutrition and poor p.o. 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  At this point unclear to me whether patient would be safe at home environment.  I spoke at length to his son [name] and the patient lives alone and prior to his hospitalization at [facility] was apparently robust and fully independent.  There is been a significant decline in his functional status and at this point0 I am not sure that home environment would be safe disposition and would likely result in readmission.  At this point TPN is the major barrier to skilled nursing facility placement so we will attempt to wean from TPN if the patient is able to meet some of his nutritional goals.  Patient has been trying to eat more and has been receptive to encouragement from nursing staff    DVT prophylaxis: SQ Lovenox Code Status: Full Family Communication: [name] [phone] on 8/24, 8/25, 8/27, 8/29 Disposition Plan: Status is: Inpatient  Remains inpatient appropriate because:Inpatient level of care appropriate due to severity of illness  Dispo: The patient is from: Home              Anticipated d/c is to:  TBD              Patient currently is not medically stable to DC   Difficult to place patient Yes  Patient is not medically  stable for discharge.  His nutritional status is poor and we are attempting to wean from TPN in order to secure a safe disposition plan to skilled nursing facility.  At this point in my medical opinion I feel that disposition home is not safe.  He has a very complex surgical abdomen and an ostomy that frequently leaks and requires specific attention and care.     Level of care: Med-Surg  Consultants:  General surgery  Procedures:  None  Antimicrobials:  None   Subjective: Seen and examined.  More awake this morning.  Has been attempting more to eat.  Mental status is improved  Objective: Vitals:   07/07/21 2015 07/08/21 0458 07/08/21  0500 07/08/21 0743  BP: (!) 103/55 110/71  (!) 114/53  Pulse: 90 82  88  Resp:    16  Temp: 97.8 F (36.6 C) 97.7 F (36.5 C)  98 F (36.7 C)  TempSrc: Oral Oral  Oral  SpO2: 100% 100%  100%  Weight:   72 kg   Height:        Intake/Output Summary (Last 24 hours) at 07/08/2021 1309 Last data filed at 07/08/2021 0449 Gross per 24 hour  Intake 50 ml  Output 2150 ml  Net -2100 ml   Filed Weights   07/05/21 0500 07/07/21 0500 07/08/21 0500  Weight: 67.7 kg 67.1 kg 72 kg    Examination:  General exam: Awake.  No visible distress Respiratory system: Lung sounds decreased at bases.  Normal work of breathing.  Room air Cardiovascular system: S1-S2, regular rate and rhythm, no murmurs, no pedal edema  gastrointestinal system: Thin, nontender, nondistended, normal bowel sounds Central nervous system: Awake and alert, no focal deficits Extremities: Symmetrically decreased power bilaterally upper and lower extremities Skin: Pale with scattered excoriations Psychiatry: Judgement and insight appear impaired. Mood & affect flattened.     Data Reviewed: I have personally reviewed following labs and imaging studies  CBC: Recent Labs  Lab 07/06/21 0459 07/06/21 1209 07/06/21 1815 07/06/21 2359 07/07/21 0601  HGB 10.3* 10.9* 10.7* 10.4* 11.6*  HCT 33.3* 36.3* 35.8* 34.0* 82.4*   Basic Metabolic Panel: Recent Labs  Lab 07/04/21 0508 07/05/21 0455 07/06/21 0459 07/07/21 0601 07/08/21 0514  NA 130* 133* 136 136 130*  K 5.2* 4.5 4.7 4.2 4.6  CL 99 105 110 107 103  CO2 22 20* 20* 20* 19*  GLUCOSE 342* 233* 133* 103* 251*  BUN 60* 77* 86* 76* 74*  CREATININE 0.89 0.86 0.97 1.00 1.11  CALCIUM 10.0 10.3 10.0 9.7 9.5  MG 2.0  --   --   --  2.2  PHOS 3.0  --   --   --  4.5   GFR: Estimated Creatinine Clearance: 47.1 mL/min (by C-G formula based on SCr of 1.11 mg/dL). Liver Function Tests: Recent Labs  Lab 07/04/21 0508 07/08/21 0514  AST 35 28  ALT 35 51*  ALKPHOS 184*  168*  BILITOT 0.6 0.5  PROT 8.4* 7.9  ALBUMIN 3.4* 3.3*   No results for input(s): LIPASE, AMYLASE in the last 168 hours. No results for input(s): AMMONIA in the last 168 hours. Coagulation Profile: No results for input(s): INR, PROTIME in the last 168 hours. Cardiac Enzymes: No results for input(s): CKTOTAL, CKMB, CKMBINDEX, TROPONINI in the last 168 hours. BNP (last 3 results) No results for input(s): PROBNP in the last 8760 hours. HbA1C: No results for input(s): HGBA1C in the last 72 hours. CBG: Recent Labs  Lab 07/07/21 2015 07/08/21  0144 07/08/21 0459 07/08/21 0742 07/08/21 1222  GLUCAP 268* 243* 233* 229* 257*   Lipid Profile: Recent Labs    07/08/21 0514  TRIG 62    Thyroid Function Tests: No results for input(s): TSH, T4TOTAL, FREET4, T3FREE, THYROIDAB in the last 72 hours. Anemia Panel: No results for input(s): VITAMINB12, FOLATE, FERRITIN, TIBC, IRON, RETICCTPCT in the last 72 hours. Sepsis Labs: No results for input(s): PROCALCITON, LATICACIDVEN in the last 168 hours.   Recent Results (from the past 240 hour(s))  Urine Culture     Status: Abnormal   Collection Time: 07/05/21  4:25 PM   Specimen: Urine, Random  Result Value Ref Range Status   Specimen Description   Final    URINE, RANDOM Performed at [facility], [address], [place], [place] [address]    Special Requests   Final    NONE Performed at [facility], [place], [place] [address]    Culture >=100,000 COLONIES/mL ESCHERICHIA COLI (A)  Final   Report Status 07/08/2021 FINAL  Final   Organism ID, Bacteria ESCHERICHIA COLI (A)  Final      Susceptibility   Escherichia coli - MIC*    AMPICILLIN >=32 RESISTANT Resistant     CEFAZOLIN >=64 RESISTANT Resistant     CEFEPIME <=0.12 SENSITIVE Sensitive     CEFTRIAXONE 32 RESISTANT Resistant     CIPROFLOXACIN 0.5 SENSITIVE Sensitive     GENTAMICIN <=1 SENSITIVE Sensitive     IMIPENEM <=0.25 SENSITIVE  Sensitive     NITROFURANTOIN <=16 SENSITIVE Sensitive     TRIMETH/SULFA <=20 SENSITIVE Sensitive     AMPICILLIN/SULBACTAM 16 INTERMEDIATE Intermediate     PIP/TAZO <=4 SENSITIVE Sensitive     * >=100,000 COLONIES/mL ESCHERICHIA COLI         Radiology Studies: No results found.      Scheduled Meds:  aspirin EC  81 mg Oral Daily   benzonatate  200 mg Oral TID   Chlorhexidine Gluconate Cloth  6 each Topical Daily   dexamethasone  4 mg Oral Daily   feeding supplement  237 mL Oral TID BM   furosemide  20 mg Oral Daily   insulin aspart  0-15 Units Subcutaneous Q4H   insulin aspart  10 Units Subcutaneous Q4H   insulin glargine-yfgn  12 Units Subcutaneous BID   loratadine  10 mg Oral Daily   losartan  25 mg Oral Daily   mirtazapine  15 mg Oral QHS   pregabalin  75 mg Oral BID   sodium chloride flush  10-40 mL Intracatheter Q12H   Continuous Infusions:  ciprofloxacin 400 mg (07/08/21 1227)   TPN ADULT (ION) 42 mL/hr at 07/07/21 2130   TPN ADULT (ION)       LOS: 12 days    Time spent: 25 minutes    Sidney Ace, MD [facility] Pager [phone]  If 7PM-7AM, please contact night-coverage 07/08/2021, 1:09 PM

## 2021-07-09 LAB — GLUCOSE, CAPILLARY
Glucose-Capillary: 122 mg/dL — ABNORMAL HIGH (ref 70–99)
Glucose-Capillary: 131 mg/dL — ABNORMAL HIGH (ref 70–99)
Glucose-Capillary: 233 mg/dL — ABNORMAL HIGH (ref 70–99)
Glucose-Capillary: 253 mg/dL — ABNORMAL HIGH (ref 70–99)
Glucose-Capillary: 284 mg/dL — ABNORMAL HIGH (ref 70–99)
Glucose-Capillary: 296 mg/dL — ABNORMAL HIGH (ref 70–99)
Glucose-Capillary: 88 mg/dL (ref 70–99)

## 2021-07-09 MED ORDER — TRAVASOL 10 % IV SOLN
INTRAVENOUS | Status: DC
  Filled 2021-07-09: qty 534.2

## 2021-07-09 MED ORDER — DRONABINOL 2.5 MG PO CAPS
5.0000 mg | ORAL_CAPSULE | Freq: Two times a day (BID) | ORAL | Status: DC
  Administered 2021-07-09 – 2021-07-24 (×31): 5 mg via ORAL
  Filled 2021-07-09 (×31): qty 2

## 2021-07-09 MED ORDER — DEXTROSE 10 % IV SOLN: INTRAVENOUS | Status: AC

## 2021-07-09 NOTE — TOC Progression Note (Signed)
Transition of Care [facility]) - Progression Note    Patient Details  Name: COTEY RAKES MRN: [id number] Date of Birth: 05-Jul-1941  Transition of Care [facility]) CM/SW Contact  Beverly Sessions, RN Phone Number: 07/09/2021, 2:39 PM  Clinical Narrative:     Confirmed with [facility] in [place] that they do not accept TPN   Expected Discharge Plan:  (TBD) Barriers to Discharge: Continued Medical Work up  Expected Discharge Plan and Services Expected Discharge Plan:  (TBD)     Post Acute Care Choice:  (TBD) Living arrangements for the past 2 months: Single Family Home                                       Social Determinants of Health (SDOH) Interventions    Readmission Risk Interventions No flowsheet data found.

## 2021-07-09 NOTE — Progress Notes (Signed)
Physical Therapy Treatment Patient Details Name: Miguel Flores MRN: [id number] DOB: [birth year] Today's Date: 07/09/2021    History of Present Illness 80 y.o. 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 [name] who presented with draining enterocutaneous fistula.     Patient has a complicated recent abdominal surgery history (see Dr. Deniece Ree note from 8/17) that began 6 weeks ago with cholecystitis at a hospital in [facility].  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  He ultimately went home, initially doing well and then started having issues with the ileostomy.    PT Comments    Pt resting in bed upon PT arrival; agreeable to PT session.  Min assist semi-supine to sitting edge of bed; min assist with transfers; and CGA to min assist to ambulate 200 feet around nursing loop with RW (pt requesting to try to walk around entire nursing station today).  Pt was able to increase distance significantly today but appeared tired post ambulation (HR and O2 sats on room air [facility]); pt also requiring assist for balance intermittently during ambulation.  Pt appearing less confused today compared to previous sessions.  D/t assist levels, pt currently not safe to discharge home alone so will keep PT follow-up recommendations for SNF at this time (will monitor pt's progress and update recommendations as appropriate).    Follow Up Recommendations  SNF     Equipment Recommendations  Rolling walker with 5" wheels;3in1 (PT)    Recommendations for Other Services       Precautions / Restrictions Precautions Precautions: Fall Precaution Comments: ostomy (plus needs to wear ostomy belt) and IV's; L chest port; L midline Restrictions Weight Bearing Restrictions: No    Mobility  Bed Mobility Overal  bed mobility: Needs Assistance Bed Mobility: Supine to Sit     Supine to sit: Min assist;HOB elevated     General bed mobility comments: via logrolling towards R side; vc's for technique; assist for trunk    Transfers Overall transfer level: Needs assistance Equipment used: Rolling walker (2 wheeled) Transfers: Sit to/from Stand Sit to Stand: Min assist         General transfer comment: increased effort and time to stand up to RW (assist to initiate stand up to walker and control descent sitting)  Ambulation/Gait Ambulation/Gait assistance: Min guard;Min assist Gait Distance (Feet): 200 Feet Assistive device: Rolling walker (2 wheeled)   Gait velocity: decreased   General Gait Details: partial step through gait pattern; intermittent assist for balance   Stairs             Wheelchair Mobility    Modified Rankin (Stroke Patients Only)       Balance Overall balance assessment: Needs assistance Sitting-balance support: No upper extremity supported;Feet supported Sitting balance-Leahy Scale: Good Sitting balance - Comments: steady sitting reaching within BOS   Standing balance support: Single extremity supported Standing balance-Leahy Scale: Fair Standing balance comment: pt requiring at least single UE support for static standing balance                            Cognition Arousal/Alertness: Awake/alert Behavior During Therapy: Flat affect Overall Cognitive Status: Impaired/Different from baseline Area of Impairment: Memory;Following commands;Safety/judgement  Orientation Level: Time   Memory: Decreased short-term memory;Decreased recall of precautions Following Commands: Follows one step commands consistently Safety/Judgement: Decreased awareness of safety;Decreased awareness of deficits   Problem Solving: Slow processing;Decreased initiation;Difficulty sequencing;Requires verbal cues;Requires tactile cues General  Comments: difficult to understand speech at times      Exercises      General Comments General comments (skin integrity, edema, etc.): ostomy dressing intact beginning/end of session.  Nursing cleared pt for participation in physical therapy.  Pt agreeable to PT session.      Pertinent Vitals/Pain Pain Assessment: No/denies pain Pain Intervention(s): Limited activity within patient's tolerance;Monitored during session;Repositioned    Home Living                      Prior Function            PT Goals (current goals can now be found in the care plan section) Acute Rehab PT Goals Patient Stated Goal: to improve strength and mobility PT Goal Formulation: With patient Time For Goal Achievement: 07/11/21 Potential to Achieve Goals: Good Progress towards PT goals: Progressing toward goals    Frequency    Min 2X/week      PT Plan Current plan remains appropriate    Co-evaluation              AM-PAC PT "6 Clicks" Mobility   Outcome Measure  Help needed turning from your back to your side while in a flat bed without using bedrails?: None Help needed moving from lying on your back to sitting on the side of a flat bed without using bedrails?: A Little Help needed moving to and from a bed to a chair (including a wheelchair)?: A Little Help needed standing up from a chair using your arms (e.g., wheelchair or bedside chair)?: A Little Help needed to walk in hospital room?: A Little Help needed climbing 3-5 steps with a railing? : A Lot 6 Click Score: 18    End of Session Equipment Utilized During Treatment: Gait belt (above ostomy and below chest port) Activity Tolerance: Patient limited by fatigue Patient left: in chair;with call bell/phone within reach;with chair alarm set Nurse Communication: Mobility status;Precautions PT Visit Diagnosis: Muscle weakness (generalized) (M62.81);Difficulty in walking, not elsewhere classified (R26.2)     Time:  6967-8938 PT Time Calculation (min) (ACUTE ONLY): 31 min  Charges:  $Gait Training: 8-22 mins $Therapeutic Activity: 8-22 mins                    Leitha Bleak, PT 07/09/21, 1:51 PM

## 2021-07-09 NOTE — TOC Progression Note (Signed)
Transition of Care [facility]) - Progression Note    Patient Details  Name: Miguel Flores MRN: [id number] Date of Birth: 13-Feb-1941  Transition of Care [facility]) CM/SW Contact  Beverly Sessions, RN Phone Number: 07/09/2021, 9:46 AM  Clinical Narrative:     Still no bed offers that can accomodate TPN at this time TPN currently at half rate   Expected Discharge Plan:  (TBD) Barriers to Discharge: Continued Medical Work up  Expected Discharge Plan and Services Expected Discharge Plan:  (TBD)     Post Acute Care Choice:  (TBD) Living arrangements for the past 2 months: Single Family Home                                       Social Determinants of Health (SDOH) Interventions    Readmission Risk Interventions No flowsheet data found.

## 2021-07-09 NOTE — Progress Notes (Signed)
Pt did not want to eat his lunch tray... says he is too nauseas.   Gave him dronabinol and will try to encourage small bites and drinks.   Pt was able to swallow pills with apple sauce.

## 2021-07-09 NOTE — Progress Notes (Signed)
Chaplain Maggie attempted visitation but patient was not available. Spiritual support/care available per on call chaplain as needed.

## 2021-07-09 NOTE — Progress Notes (Signed)
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  He did had evidence of metastatic lung disease but is was able to be controlled with chemotherapy.  He presented to [facility] 6 weeks ago for acute cholecystitis and underwent laparoscopic cholecystectomy on May 16, 2021.  His hospitalization was complicated by missed bowel injury requiring exploratory laparotomy and multiple interventions.  Eventually the patient developed enterocutaneous fistula and was in the hospital for over 4 weeks.  Now he presents with issues regarding enterocutaneous fistula and the Eakin pouch leaking. I personally reviewed the CT scan and there is evidence of a small collections that given his primary pathology, general surgery will not attempt any percutaneous drainage.  This may lead to more complications given his EC fistula and friable bowel.   Patient admitted 06/26/2021 for problems with fistula and bag catching the stool.  Wound care has been able to get a bag that is doing the symptoms and catching his stool and only leaking intermittently.  The patient does have a cough and has a little interstitial edema and bilateral pleural effusions and that improved a little bit with IV Lasix and IV albumin.  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 [name] as outpatient.  Disposition has become very challenging.  The family is unable to provide the care that they feel the patient needs for home.  TPN is the major barrier to finding a accepting facility.  Per TOC she is reached out to 40+ facilities without an accepting one with the majority claiming that TPN is the barrier to admission.  I discussed this at length with patient and patient's son [name] on phone.  Current plan of  care is to attempt to wean off of TPN.  If we are able to do so and patient can meet the majority of his nutritional needs we can get him to a skilled nursing facility and he can seek surgical follow-up at tertiary care center.  As of 8/26 patient is starting to eat more.  We are tapering TPN.  Current rate 83 mL/h.  Taper down to 42 mL/h  8/27: Patient became altered and delirious last night.  Hospital-acquired delirium versus urinary tract infection.  Unfortunately patient self discontinued his PICC line and pulled his ileostomy.  Ileostomy is in place and functioning appropriately.  After discussion with the PICC team and the IV nurse and the bedside RN decision was made to heparinize and access patient's implanted port and administer TPN via this route.  This will avoid placement of another PICC line and will allow [place] to continue parenteral nutrition.  8/29: Patient meeting about 50 to 60% of his nutritional goals.  Needs to be 90% or greater in order for [place] to safely discontinue TPN.   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[place]) Active Problems:   Essential hypertension   Type 2 diabetes mellitus with hyperlipidemia (HCC)   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HCC)   Lung metastases (HCC)   Post cholecystectomy intraabdominal abscess   S/P cholecystectomy   On total parenteral nutrition (TPN)   Malnutrition of moderate degree   Overweight (BMI 25.0-29.9)   Enterocutaneous fistula   Anemia of chronic disease   Hyponatremia   Cough   Pleural effusion   Acute  metabolic encephalopathy   Palliative care encounter  Enterocutaneous fistula Multiple fluid collections on CT abdomen Extensive and complex surgical history Majority of care at outside facility in [facility] surgery consulted here They will facilitate outpatient evaluation and referral to tertiary care facility however will be 6 months before any intervention Currently no recommendations of  intervention on the abdomen Plan: Ostomy care per nursing and WOC  Moderate protein calorie malnutrition Currently on TPN.  Patient doing better with oral intake TPN weaned to half rate as of 8/26 Unfortunately patient pulled out PICC line on 8/26 Port accessed and TPN to be administered via this route Oral intake has been improving but still not meeting nutritional goals Plan: Calorie count completed Patient still not meeting his nutritional goals Continue maximizing oral intake Attempt to wean off TPN entirely within the next few days Instructions given to nursing staff.   Every single staff member that walks into the patient's room during the course of the day needs to encourage him to eat  Urinary tract infection Repeat urinalysis on 8/26 nitrite positive Unclear whether this represents true infection but will treat as such given the encephalopathy Urine culture with greater than 100,000 colony-forming units gram negative rods Resistant to Rocephin Plan: Continue IV Rocephin Plan for 7-day antibiotic course Monitor vitals and fever curve  Acute metabolic encephalopathy Baseline is unclear however concern the patient was confused.   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 [name] as outpatient No active oncology issues however palliative care nurse practitioner [name] was consulted for goals of care At this time patient and family not interested in de-escalation of care Outpatient follow-up  Hyponatremia Likely secondary to malnutrition and poor p.o. 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  At this point unclear to me whether patient would be safe at home environment.  I spoke at length to his son [name] and the patient lives alone and prior to his hospitalization at [facility] was apparently robust and fully independent.  There is been a significant decline in his functional status and at this point0 I am not sure that home environment would be safe disposition and would likely result in readmission.  At this point TPN is the major barrier to skilled nursing facility placement so we will attempt to wean from TPN if the patient is able to meet some of his nutritional goals.  Patient has been trying to eat more and has been receptive to encouragement from nursing staff    DVT prophylaxis: SQ Lovenox Code Status: Full Family Communication: [name] [phone] on 8/24, 8/25, 8/27, 8/29.  Left VM on 8/30 Disposition Plan: Status is: Inpatient  Remains inpatient appropriate because:Inpatient level of care appropriate due to severity of illness  Dispo: The patient is from: Home              Anticipated d/c is to:  TBD              Patient currently is not medically stable to DC  Difficult to place patient Yes  Patient is not medically stable for discharge.  His nutritional status is poor and we are attempting to wean from TPN in order to secure a safe disposition plan to skilled nursing facility.  At this point in my medical opinion I feel that disposition home is not safe.  He has a very complex surgical abdomen and an ostomy that frequently leaks and requires specific attention and care.     Level of care: Med-Surg  Consultants:  General surgery  Procedures:  None  Antimicrobials:  None   Subjective: Seen and examined.  More awake this morning.  Has been attempting more to eat.  Mental status is  improved  Objective: Vitals:   07/08/21 1958 07/09/21 0428 07/09/21 0624 07/09/21 0807  BP: (!) 121/58 (!) 110/58  122/64  Pulse: 82 75  78  Resp: 16 16    Temp: 97.6 F (36.4 C) (!) 97.5 F (36.4 C)  97.7 F (36.5 C)  TempSrc: Oral Oral    SpO2: 100% 100%  100%  Weight:   66.1 kg   Height:        Intake/Output Summary (Last 24 hours) at 07/09/2021 1433 Last data filed at 07/09/2021 0800 Gross per 24 hour  Intake 2202.4 ml  Output 2100 ml  Net 102.4 ml   Filed Weights   07/07/21 0500 07/08/21 0500 07/09/21 0624  Weight: 67.1 kg 72 kg 66.1 kg    Examination:  General exam: Awake.  No visible distress Respiratory system: Lung sounds decreased at bases.  Normal work of breathing.  Room air Cardiovascular system: S1-S2, regular rate and rhythm, no murmurs, no pedal edema  gastrointestinal system: Thin, nontender, nondistended, normal bowel sounds Central nervous system: Awake and alert, no focal deficits Extremities: Symmetrically decreased power bilaterally upper and lower extremities Skin: Pale with scattered excoriations Psychiatry: Judgement and insight appear impaired. Mood & affect flattened.     Data Reviewed: I have personally reviewed following labs and imaging studies  CBC: Recent Labs  Lab 07/06/21 0459 07/06/21 1209 07/06/21 1815 07/06/21 2359 07/07/21 0601  HGB 10.3* 10.9* 10.7* 10.4* 11.6*  HCT 33.3* 36.3* 35.8* 34.0* 00.4*   Basic Metabolic Panel: Recent Labs  Lab 07/04/21 0508 07/05/21 0455 07/06/21 0459 07/07/21 0601 07/08/21 0514  NA 130* 133* 136 136 130*  K 5.2* 4.5 4.7 4.2 4.6  CL 99 105 110 107 103  CO2 22 20* 20* 20* 19*  GLUCOSE 342* 233* 133* 103* 251*  BUN 60* 77* 86* 76* 74*  CREATININE 0.89 0.86 0.97 1.00 1.11  CALCIUM 10.0 10.3 10.0 9.7 9.5  MG 2.0  --   --   --  2.2  PHOS 3.0  --   --   --  4.5   GFR: Estimated Creatinine Clearance: 47.1 mL/min (by C-G formula based on SCr of 1.11 mg/dL). Liver Function  Tests: Recent Labs  Lab 07/04/21 0508 07/08/21 0514  AST 35 28  ALT 35 51*  ALKPHOS 184* 168*  BILITOT 0.6 0.5  PROT 8.4* 7.9  ALBUMIN 3.4* 3.3*   No results for input(s): LIPASE, AMYLASE in the last 168 hours. No results for input(s): AMMONIA in the last 168 hours. Coagulation Profile: No results for input(s): INR, PROTIME in the last 168 hours. Cardiac Enzymes: No results for input(s): CKTOTAL, CKMB, CKMBINDEX, TROPONINI in the last 168 hours. BNP (last 3 results) No results for input(s): PROBNP in the last 8760 hours. HbA1C: No results for input(s): HGBA1C in the  last 72 hours. CBG: Recent Labs  Lab 07/08/21 2013 07/09/21 0016 07/09/21 0417 07/09/21 0923 07/09/21 1247  GLUCAP 251* 233* 88 253* 284*   Lipid Profile: Recent Labs    07/08/21 0514  TRIG 62    Thyroid Function Tests: No results for input(s): TSH, T4TOTAL, FREET4, T3FREE, THYROIDAB in the last 72 hours. Anemia Panel: No results for input(s): VITAMINB12, FOLATE, FERRITIN, TIBC, IRON, RETICCTPCT in the last 72 hours. Sepsis Labs: No results for input(s): PROCALCITON, LATICACIDVEN in the last 168 hours.   Recent Results (from the past 240 hour(s))  Urine Culture     Status: Abnormal   Collection Time: 07/05/21  4:25 PM   Specimen: Urine, Random  Result Value Ref Range Status   Specimen Description   Final    URINE, RANDOM Performed at [facility], [address], [place], [place] [address]    Special Requests   Final    NONE Performed at [facility], [place],  [address]    Culture >=100,000 COLONIES/mL ESCHERICHIA COLI (A)  Final   Report Status 07/08/2021 FINAL  Final   Organism ID, Bacteria ESCHERICHIA COLI (A)  Final      Susceptibility   Escherichia coli - MIC*    AMPICILLIN >=32 RESISTANT Resistant     CEFAZOLIN >=64 RESISTANT Resistant     CEFEPIME <=0.12 SENSITIVE Sensitive     CEFTRIAXONE 32 RESISTANT Resistant     CIPROFLOXACIN 0.5  SENSITIVE Sensitive     GENTAMICIN <=1 SENSITIVE Sensitive     IMIPENEM <=0.25 SENSITIVE Sensitive     NITROFURANTOIN <=16 SENSITIVE Sensitive     TRIMETH/SULFA <=20 SENSITIVE Sensitive     AMPICILLIN/SULBACTAM 16 INTERMEDIATE Intermediate     PIP/TAZO <=4 SENSITIVE Sensitive     * >=100,000 COLONIES/mL ESCHERICHIA COLI         Radiology Studies: No results found.      Scheduled Meds:  aspirin EC  81 mg Oral Daily   benzonatate  200 mg Oral TID   Chlorhexidine Gluconate Cloth  6 each Topical Daily   dexamethasone  4 mg Oral Daily   feeding supplement  237 mL Oral TID BM   furosemide  20 mg Oral Daily   insulin aspart  0-15 Units Subcutaneous Q4H   insulin aspart  10 Units Subcutaneous Q4H   insulin glargine-yfgn  12 Units Subcutaneous BID   loratadine  10 mg Oral Daily   losartan  25 mg Oral Daily   mirtazapine  15 mg Oral QHS   pregabalin  75 mg Oral BID   sodium chloride flush  10-40 mL Intracatheter Q12H   Continuous Infusions:  ciprofloxacin 400 mg (07/09/21 1043)   TPN ADULT (ION) 42 mL/hr at 07/09/21 0800   TPN ADULT (ION)       LOS: 13 days    Time spent: 25 minutes    Sidney Ace, MD [facility] Pager [phone]  If 7PM-7AM, please contact night-coverage 07/09/2021, 2:33 PM

## 2021-07-09 NOTE — Consult Note (Signed)
PHARMACY - TOTAL PARENTERAL NUTRITION CONSULT NOTE   Indication: Fistula  Patient Measurements: Height: 5' 5.98" (167.6 cm) Weight: 66.1 kg (145 lb 11.6 oz) IBW/kg (Calculated) : 63.76 TPN AdjBW (KG): 74.4 Body mass index is 23.53 kg/m.  Assessment:  Patient is an 80 y/o M with medical history including diabetes, HTN, colon cancer with lung metastases, recent history of admission at OSH for cholecystitis s/p cholecystectomy c/b post-op sepsis. Patient underwent ExLap with findings of small bowel injury which was repaired. 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 He is now presenting to the hospital for ileostomy leakage. Imaging notable for multiple intra-abdominal abscess with contrast extravasation to midline of abdominal wound. General surgery is following.   Pharmacy consulted to continue home TPN while patient is admitted.  8/26 evening - pt delirious and pulled PICC - nutrition changed to D10 @ 75 pending PICC replacement  8/27 evening - TPN restarted using port per discussion with the PICC team and the IV nurse and the bedside RN decision was made to heparinize and access patient's implanted port and administer TPN via this route  Glucose / Insulin: History of controlled diabetes (last Hgb A1c 6.9%).    Novolog coverage 12 units q4hours (several doses held) + SSI q4  (total Novolog 78 units/24 hours)  Two doses of Symglee 10 units on 8/28 (total Symglee 22 units / 24 hours)  Electrolytes: Hyponatremia (130), Potassium improved (5.2>4.5>4.7>4.2>4.6) Renal: Scr = 1 Hepatic: LFTs within normal limits Intake / Output; MIVF: -364 mL since admission  GI Imaging: 8/17 CT abdomen / pelvis: Large ventral wound containing small amount of contrast. Enterocutaneous fistula. Multiple intra-abdominal abscess(s). Mesenteric inflammation. Small bilateral pleural effusions with atelectasis or pneumonia at the  bases. Metastatic lung nodules, slight increased size of right lower lobe metastatic nodule  GI Surgeries / Procedures:  History of recent cholecystectomy, ExLap at OSH in 05/2021  Central access: Port (see note above) TPN start date: 8/18   Outpatient TPN information: [facility] in [place], [place] Ph: [phone]  Nutritional Goals: Goal TPN rate was 83 mL/hr (provides 105.6 g of protein and 2262 kcals per day) - now weaning  RD Assessment: Estimated Needs Total Energy Estimated Needs: 2000-2300kcal/day Total Protein Estimated Needs: 100-115g/day Total Fluid Estimated Needs: 2.0-2.3L/day  Current Nutrition:  TPN  Plan:  Continue 42 mL/hr per dietician recommendations/weaning plan  Patient has been drinking Ensures over the past day and is beginning to eat meals - but still inconsistent per RD. Nutritional components: Amino acids (Travasol 10%): 53.42 grams Lipids (20% SMOF): 34.8 grams Dextrose: 171.36 grams Electrolytes in TPN (standard): Na 30mEq/L, KCl 15 mEq/L, Ca 5 mEq/L, Mg 27mEq/L, and Phos 2mmol/L. Cl:Ac 1:1 Add standard MVI and trace elements to TPN Increase Symglee to 12 u BID and Novolog to 12 units q4 hr + SSI - will monitor carefully, and hold when needed Monitor TPN labs on Mon/Thurs, daily until stable   Pearla Dubonnet, PharmD Clinical Pharmacist 07/09/2021 3:41 PM

## 2021-07-09 NOTE — Progress Notes (Signed)
Pt's son [name]) is at bedside and helping feed.... pt ate 50% of food that son brought (fried fish and bake potato)

## 2021-07-10 LAB — GLUCOSE, CAPILLARY
Glucose-Capillary: 238 mg/dL — ABNORMAL HIGH (ref 70–99)
Glucose-Capillary: 128 mg/dL — ABNORMAL HIGH (ref 70–99)
Glucose-Capillary: 178 mg/dL — ABNORMAL HIGH (ref 70–99)
Glucose-Capillary: 246 mg/dL — ABNORMAL HIGH (ref 70–99)
Glucose-Capillary: 327 mg/dL — ABNORMAL HIGH (ref 70–99)

## 2021-07-10 MED ORDER — ENOXAPARIN SODIUM 40 MG/0.4ML IJ SOSY
40.0000 mg | PREFILLED_SYRINGE | INTRAMUSCULAR | Status: DC
  Administered 2021-07-10: 40 mg via SUBCUTANEOUS
  Filled 2021-07-10: qty 0.4

## 2021-07-10 MED ORDER — TRAVASOL 10 % IV SOLN
INTRAVENOUS | Status: AC
  Filled 2021-07-10: qty 534.2

## 2021-07-10 NOTE — Progress Notes (Signed)
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  He did had evidence of metastatic lung disease but is was able to be controlled with chemotherapy.  He presented to [facility] 6 weeks ago for acute cholecystitis and underwent laparoscopic cholecystectomy on May 16, 2021.  His hospitalization was complicated by missed bowel injury requiring exploratory laparotomy and multiple interventions.  Eventually the patient developed enterocutaneous fistula and was in the hospital for over 4 weeks.  Now he presents with issues regarding enterocutaneous fistula and the Eakin pouch leaking. I personally reviewed the CT scan and there is evidence of a small collections that given his primary pathology, general surgery will not attempt any percutaneous drainage.  This may lead to more complications given his EC fistula and friable bowel.   Patient admitted 06/26/2021 for problems with fistula and bag catching the stool.  Wound care has been able to get a bag that is doing the symptoms and catching his stool and only leaking intermittently.  The patient does have a cough and has a little interstitial edema and bilateral pleural effusions and that improved a little bit with IV Lasix and IV albumin.  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 [name] as outpatient.  Disposition has become very challenging.  The family is unable to provide the care that they feel the patient needs for home.  TPN is the major barrier to finding a accepting facility.  Per TOC she is reached out to 40+ facilities without an accepting one with the majority claiming that TPN is the barrier to admission.  I discussed this at length with patient and patient's son [name] on phone.  Current plan of  care is to attempt to wean off of TPN.  If we are able to do so and patient can meet the majority of his nutritional needs we can get him to a skilled nursing facility and he can seek surgical follow-up at tertiary care center.  As of 8/26 patient is starting to eat more.  We are tapering TPN.  Current rate 83 mL/h.  Taper down to 42 mL/h  8/27: Patient became altered and delirious last night.  Hospital-acquired delirium versus urinary tract infection.  Unfortunately patient self discontinued his PICC line and pulled his ileostomy.  Ileostomy is in place and functioning appropriately.  After discussion with the PICC team and the IV nurse and the bedside RN decision was made to heparinize and access patient's implanted port and administer TPN via this route.  This will avoid placement of another PICC line and will allow [place] to continue parenteral nutrition.  8/29: Patient meeting about 50 to 60% of his nutritional goals.  Needs to be 90% or greater in order for [place] to safely discontinue TPN.   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[place]) Active Problems:   Essential hypertension   Type 2 diabetes mellitus with hyperlipidemia (HCC)   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HCC)   Lung metastases (HCC)   Post cholecystectomy intraabdominal abscess   S/P cholecystectomy   On total parenteral nutrition (TPN)   Malnutrition of moderate degree   Overweight (BMI 25.0-29.9)   Enterocutaneous fistula   Anemia of chronic disease   Hyponatremia   Cough   Pleural effusion   Acute  metabolic encephalopathy   Palliative care encounter  Enterocutaneous fistula Multiple fluid collections on CT abdomen Extensive and complex surgical history Majority of care at outside facility in [facility] surgery consulted here They will facilitate outpatient evaluation and referral to tertiary care facility however will be 6 months before any intervention Currently no recommendations of  intervention on the abdomen Plan: Ostomy care per nursing and WOC  Moderate protein calorie malnutrition Currently on TPN.  Patient doing better with oral intake TPN weaned to half rate as of 8/26 Unfortunately patient pulled out PICC line on 8/26 Port accessed and TPN to be administered via this route Oral intake has been improving but still not meeting nutritional goals Plan: Calorie count completed Patient still not meeting his nutritional goals Continue maximizing oral intake Attempt to wean off TPN entirely within the next few days Instructions given to nursing staff.   Every single staff member that walks into the patient's room during the course of the day needs to encourage him to eat  Urinary tract infection Repeat urinalysis on 8/26 nitrite positive Unclear whether this represents true infection but will treat as such given the encephalopathy Urine culture with greater than 100,000 colony-forming units gram negative rods Resistant to Rocephin Plan: Continue IV Rocephin Plan for 7-day antibiotic course Monitor vitals and fever curve  Acute metabolic encephalopathy Baseline is unclear however concern the patient was confused.   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 [name] as outpatient No active oncology issues however palliative care nurse practitioner [name] was consulted for goals of care At this time patient and family not interested in de-escalation of care Outpatient follow-up  Hyponatremia Likely secondary to malnutrition and poor p.o. 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  At this point unclear to me whether patient would be safe at home environment.  I spoke at length to his son [name] and the patient lives alone and prior to his hospitalization at [facility] was apparently robust and fully independent.  There is been a significant decline in his functional status and at this point0 I am not sure that home environment would be safe disposition and would likely result in readmission.  At this point TPN is the major barrier to skilled nursing facility placement so we will attempt to wean from TPN if the patient is able to meet some of his nutritional goals.  Patient has been trying to eat more and has been receptive to encouragement from nursing staff  8/31 as per discussion with oncologist, patient's son is open for hospice care at home now and he is working to arrange 24/7 care at home.  In the meantime we will continue TPN as an inpatient, plan is to go home without TPN when 24/7 care will be arranged and hospice will be consulted as well.   DVT prophylaxis: SQ Lovenox Code Status: Full Family Communication: [name] [phone] on 8/24, 8/25, 8/27, 8/29.  Left VM on 8/30 Disposition Plan: Status is: Inpatient  Remains inpatient appropriate because:Inpatient  level of care appropriate due to severity of illness  Dispo: The patient is from: Home              Anticipated d/c is to: Home with Hospice care and 24/7 arrangement private pay              Patient currently is not medically stable to DC, needs 24/7 home care arrangement    Difficult to place patient No  Patient is not medically stable for discharge.  His nutritional status is poor and we are attempting to wean from TPN in order to secure a safe disposition plan to skilled nursing facility.  At this point in my medical opinion I feel that  disposition home is not safe.  He has a very complex surgical abdomen and an ostomy that frequently leaks and requires specific attention and care.   Level of care: Med-Surg  Consultants:  General surgery Oncologist  Procedures:  None  Antimicrobials:  None   Subjective: Overnight patient was confused, and was pulling IV lines.  Mittens were placed.  In the morning time patient was AO x2, slightly confused, unable to offer any complaints.  Denied any chest pain or palpitation, no shortness of breath, no abdominal pain.   Objective: Vitals:   07/10/21 0425 07/10/21 0500 07/10/21 0721 07/10/21 1533  BP: 113/62  (!) 121/58 (!) 136/52  Pulse: 79  74 81  Resp: 18  16 20   Temp: 97.9 F (36.6 C)  (!) 97.5 F (36.4 C) 97.8 F (36.6 C)  TempSrc:   Oral Oral  SpO2: 100%  100% 100%  Weight:  66.8 kg    Height:        Intake/Output Summary (Last 24 hours) at 07/10/2021 1747 Last data filed at 07/10/2021 1323 Gross per 24 hour  Intake 1290.63 ml  Output 1270 ml  Net 20.63 ml   Filed Weights   07/08/21 0500 07/09/21 0624 07/10/21 0500  Weight: 72 kg 66.1 kg 66.8 kg    Examination:  General exam: Awake.  No visible distress Respiratory system: Lung sounds decreased at bases.  Normal work of breathing.  Room air Cardiovascular system: S1-S2, regular rate and rhythm, no murmurs, no pedal edema  gastrointestinal system: Thin, nontender, nondistended, normal bowel sounds Central nervous system: Awake and alert, no focal deficits Extremities: Symmetrically decreased power bilaterally upper and lower extremities Skin: Pale with scattered excoriations Psychiatry: Judgement and insight appear impaired. Mood & affect flattened.     Data Reviewed: I have personally reviewed following labs and imaging studies  CBC: Recent Labs  Lab 07/06/21 0459 07/06/21 1209 07/06/21 1815 07/06/21 2359 07/07/21 0601  HGB 10.3* 10.9* 10.7* 10.4* 11.6*  HCT 33.3* 36.3* 35.8* 34.0* 37.7*    Basic Metabolic Panel: Recent Labs  Lab 07/04/21 0508 07/05/21 0455 07/06/21 0459 07/07/21 0601 07/08/21 0514  NA 130* 133* 136 136 130*  K 5.2* 4.5 4.7 4.2 4.6  CL 99 105 110 107 103  CO2 22 20* 20* 20* 19*  GLUCOSE 342* 233* 133* 103* 251*  BUN 60* 77* 86* 76* 74*  CREATININE 0.89 0.86 0.97 1.00 1.11  CALCIUM 10.0 10.3 10.0 9.7 9.5  MG 2.0  --   --   --  2.2  PHOS 3.0  --   --   --  4.5   GFR: Estimated Creatinine Clearance: 47.1 mL/min (by C-G formula based on SCr of 1.11 mg/dL). Liver Function Tests: Recent Labs  Lab 07/04/21 0508 07/08/21 0514  AST 35  28  ALT 35 51*  ALKPHOS 184* 168*  BILITOT 0.6 0.5  PROT 8.4* 7.9  ALBUMIN 3.4* 3.3*   No results for input(s): LIPASE, AMYLASE in the last 168 hours. No results for input(s): AMMONIA in the last 168 hours. Coagulation Profile: No results for input(s): INR, PROTIME in the last 168 hours. Cardiac Enzymes: No results for input(s): CKTOTAL, CKMB, CKMBINDEX, TROPONINI in the last 168 hours. BNP (last 3 results) No results for input(s): PROBNP in the last 8760 hours. HbA1C: No results for input(s): HGBA1C in the last 72 hours. CBG: Recent Labs  Lab 07/09/21 2350 07/10/21 0337 07/10/21 0718 07/10/21 1146 07/10/21 1624  GLUCAP 131* 238* 128* 178* 246*   Lipid Profile: Recent Labs    07/08/21 0514  TRIG 62    Thyroid Function Tests: No results for input(s): TSH, T4TOTAL, FREET4, T3FREE, THYROIDAB in the last 72 hours. Anemia Panel: No results for input(s): VITAMINB12, FOLATE, FERRITIN, TIBC, IRON, RETICCTPCT in the last 72 hours. Sepsis Labs: No results for input(s): PROCALCITON, LATICACIDVEN in the last 168 hours.   Recent Results (from the past 240 hour(s))  Urine Culture     Status: Abnormal   Collection Time: 07/05/21  4:25 PM   Specimen: Urine, Random  Result Value Ref Range Status   Specimen Description   Final    URINE, RANDOM Performed at [facility], [address]  [place], [place] [address]    Special Requests   Final    NONE Performed at [facility], [place],  [address]    Culture >=100,000 COLONIES/mL ESCHERICHIA COLI (A)  Final   Report Status 07/08/2021 FINAL  Final   Organism ID, Bacteria ESCHERICHIA COLI (A)  Final      Susceptibility   Escherichia coli - MIC*    AMPICILLIN >=32 RESISTANT Resistant     CEFAZOLIN >=64 RESISTANT Resistant     CEFEPIME <=0.12 SENSITIVE Sensitive     CEFTRIAXONE 32 RESISTANT Resistant     CIPROFLOXACIN 0.5 SENSITIVE Sensitive     GENTAMICIN <=1 SENSITIVE Sensitive     IMIPENEM <=0.25 SENSITIVE Sensitive     NITROFURANTOIN <=16 SENSITIVE Sensitive     TRIMETH/SULFA <=20 SENSITIVE Sensitive     AMPICILLIN/SULBACTAM 16 INTERMEDIATE Intermediate     PIP/TAZO <=4 SENSITIVE Sensitive     * >=100,000 COLONIES/mL ESCHERICHIA COLI         Radiology Studies: No results found.      Scheduled Meds:  aspirin EC  81 mg Oral Daily   benzonatate  200 mg Oral TID   Chlorhexidine Gluconate Cloth  6 each Topical Daily   dexamethasone  4 mg Oral Daily   dronabinol  5 mg Oral BID AC   enoxaparin (LOVENOX) injection  40 mg Subcutaneous Q24H   feeding supplement  237 mL Oral TID BM   furosemide  20 mg Oral Daily   insulin aspart  0-15 Units Subcutaneous Q4H   insulin aspart  10 Units Subcutaneous Q4H   insulin glargine-yfgn  12 Units Subcutaneous BID   loratadine  10 mg Oral Daily   losartan  25 mg Oral Daily   mirtazapine  15 mg Oral QHS   pregabalin  75 mg Oral BID   sodium chloride flush  10-40 mL Intracatheter Q12H   Continuous Infusions:  ciprofloxacin 400 mg (07/10/21 0842)   dextrose 75 mL/hr at 07/10/21 1307   TPN ADULT (ION)       LOS: 14 days  Time spent: 25 minutes    Val Riles, MD [facility] Pager [phone]  If 7PM-7AM, please contact night-coverage 07/10/2021, 5:47 PM

## 2021-07-10 NOTE — Progress Notes (Signed)
Pt pulled out his TPN from his port, leaked all over. Unable to get a new bag of TPN tonight per pharmacy till tomorrow morning. Dextrose 10% ordered for now by oncall provider.

## 2021-07-10 NOTE — Plan of Care (Signed)
  Problem: Clinical Measurements: Goal: Ability to maintain clinical measurements within normal limits will improve Outcome: Progressing Goal: Will remain free from infection Outcome: Progressing Goal: Diagnostic test results will improve Outcome: Progressing Goal: Respiratory complications will improve Outcome: Progressing Goal: Cardiovascular complication will be avoided Outcome: Progressing   Problem: Pain Managment: Goal: General experience of comfort will improve Outcome: Progressing   Pt was alert and oriented x 3 but then showing confusion and getting restless towards the night. Pt trying to get up and pulling the tape from his IV access. Mittens placed. V/S stable. Encouraged to drink his ensure.

## 2021-07-10 NOTE — Progress Notes (Signed)
PT Cancellation Note  Patient Details Name: Miguel Flores MRN: [id number] DOB: Mar 19, 1941   Cancelled Treatment:    Reason Eval/Treat Not Completed: Fatigue/lethargy limiting ability to participate. Patient sleeping soundly this pm when returned to attempt PT. Will re-attempt at later time/date.    Eiliyah Reh 07/10/2021, 3:20 PM

## 2021-07-10 NOTE — Consult Note (Signed)
PHARMACY - TOTAL PARENTERAL NUTRITION CONSULT NOTE   Indication: Fistula  Patient Measurements: Height: 5' 5.98" (167.6 cm) Weight: 66.8 kg (147 lb 4.3 oz) IBW/kg (Calculated) : 63.76 TPN AdjBW (KG): 74.4 Body mass index is 23.78 kg/m.  Assessment:  Patient is an 80 y/o M with medical history including diabetes, HTN, colon cancer with lung metastases, recent history of admission at OSH for cholecystitis s/p cholecystectomy c/b post-op sepsis. Patient underwent ExLap with findings of small bowel injury which was repaired. 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 He is now presenting to the hospital for ileostomy leakage. Imaging notable for multiple intra-abdominal abscess with contrast extravasation to midline of abdominal wound. General surgery is following.   Pharmacy consulted to continue home TPN while patient is admitted.  Glucose / Insulin: History of controlled diabetes (last Hgb A1c 6.9%).    Novolog coverage 10 units q4hours + SSI q4 + glargine  Electrolytes: Hyponatremia (130), Potassium improved (5.2>4.5>4.7>4.2>4.6) Renal: SCr stable Hepatic: LFTs within normal limits Intake / Output; MIVF: -228 mL since admission  GI Imaging: 8/17 CT abdomen / pelvis: Large ventral wound containing small amount of contrast. Enterocutaneous fistula. Multiple intra-abdominal abscess(s). Mesenteric inflammation. Small bilateral pleural effusions with atelectasis or pneumonia at the bases. Metastatic lung nodules, slight increased size of right lower lobe metastatic nodule  GI Surgeries / Procedures:  History of recent cholecystectomy, ExLap at OSH in 05/2021  Central access: Port (see note above) TPN start date: 8/18   Outpatient TPN information: [facility] [place], [place] Ph: [phone]  Nutritional Goals: Goal TPN rate was 83 mL/hr (provides 105.6 g of protein and 2262 kcals per day) - now  weaning  RD Assessment: Estimated Needs Total Energy Estimated Needs: 2000-2300kcal/day Total Protein Estimated Needs: 100-115g/day Total Fluid Estimated Needs: 2.0-2.3L/day  Current Nutrition:  TPN  Plan:  Continue 42 mL/hr  Patient has been drinking Ensures over the past day and is beginning to eat meals - but still inconsistent per RD. Nutritional components: Amino acids (Travasol 10%): 53.42 grams Lipids (20% SMOF): 34.8 grams Dextrose: 171.36 grams Electrolytes in TPN (standard): Na 76mEq/L, KCl 15 mEq/L, Ca 5 mEq/L, Mg 71mEq/L, and Phos 76mmol/L. Cl:Ac 1:1 Add standard MVI and trace elements to TPN Increase Symglee to 12 u BID and Novolog to 12 units q4 hr + SSI - will monitor carefully, and hold when needed Monitor TPN labs on Mon/Thurs, daily until stable   Dallie Piles, PharmD Clinical Pharmacist 07/10/2021 7:49 AM

## 2021-07-10 NOTE — Progress Notes (Signed)
Nutrition Follow Up Note   DOCUMENTATION CODES:   Non-severe (moderate) malnutrition in context of chronic illness  INTERVENTION:   Continue TPN per pharmacy- currently at half rate   Ensure Enlive po TID, each supplement provides 350 kcal and 20 grams of protein  Magic cup TID with meals, each supplement provides 290 kcal and 9 grams of protein  48 hour calorie count completed   NUTRITION DIAGNOSIS:   Moderate Malnutrition related to cancer and cancer related treatments as evidenced by moderate fat depletion, moderate muscle depletion, severe muscle depletion.  GOAL:   Patient will meet greater than or equal to 90% of their needs -met with TPN and oral intake  MONITOR:   PO intake, Supplement acceptance, Labs, Weight trends, Skin, I & O's, Other (Comment) (TPN)  ASSESSMENT:   80 year old male with history of DM, HTN, HLD, CLD III, metastatic sigmoid adenocarcinoma (s/p Hartmann's 01/2017, adjuvant Xeloda and on chemotherapy), small bilateral pleural effusions and known metastatic lung nodules, cholecystitis s/p cholecystectomy 11/10/00 complicated by bowel perforation requiring additional laparotomies and now on home TPN with EC fistula and Eakin pouch and intra-abdominal abscess.  Pt with intermittent confusion; pt pulling at lines and disconnected his TPN overnight. Pt previously removed PICC line and TPN has been administering via pt's chemo port. Pt tolerating TPN well at half rate. TOC having issues with finding placement for patient as most facilities are not equip to manage TPN. Family is not comfortable caring for the patient at home. Forty eight hour calorie count completed and pt is meeting about 50% of his estimated needs via oral intake; however, the majority of pt's calorie and protein intake is coming from the Ensure and [place]. Pt also needs a lot of encouragement from family to eat. Pt is receiving marinol, dexamethasone and remeron to try and increase his oral  intake. Current plan is to maximize oral nutrition so that TPN can be discontinued and then SNF placement can occur. RD suspects that pt will not likely be able to meet his estimated needs via oral intake. Palliative care Vonna [employer]) met with pt and family on 8/23. Per chart, pt is down 23lbs(14%) since admission and is now down ~20lbs(12%) from his UBW; this is significant.   Medications reviewed and include: aspirin, dexamethasone, marinol, lasix, insulin, mirtazapine, ciprofloxacin, 10% dextrose '@75ml' /hr  Labs reviewed: Na 130(L), K 4.6 wnl, P 4.5 wnl, Mg 2.2 wnl Triglycerides 62- 8/29 Cbgs- 238, 128 x 24 hrs  Diet Order:    Diet Order             DIET DYS 3 Room service appropriate? Yes; Fluid consistency: Thin  Diet effective now                  EDUCATION NEEDS:   Education needs have been addressed  Skin:  Skin Assessment: Reviewed RN Assessment (midabdominal wound with EC fistula 3.5 x 2.5 x 1 cm)  Last BM:  8/30- type 7  Height:   Ht Readings from Last 1 Encounters:  06/27/21 5' 5.98" (1.676 m)    Weight:   Wt Readings from Last 1 Encounters:  07/10/21 66.8 kg    Ideal Body Weight:  64.5 kg  BMI:  Body mass index is 23.78 kg/m.  Estimated Nutritional Needs:   Kcal:  2000-2300kcal/day  Protein:  100-115g/day  Fluid:  2.0-2.3L/day  Koleen Distance MS, RD, LDN Please refer to [facility] for RD and/or RD on-call/weekend/after hours pager

## 2021-07-11 DIAGNOSIS — Z515 Encounter for palliative care: Secondary | ICD-10-CM | POA: Diagnosis not present

## 2021-07-11 DIAGNOSIS — K9413 Enterostomy malfunction: Secondary | ICD-10-CM | POA: Diagnosis not present

## 2021-07-11 LAB — COMPREHENSIVE METABOLIC PANEL
ALT: 53 U/L — ABNORMAL HIGH (ref 0–44)
AST: 37 U/L (ref 15–41)
Albumin: 2.8 g/dL — ABNORMAL LOW (ref 3.5–5.0)
Alkaline Phosphatase: 147 U/L — ABNORMAL HIGH (ref 38–126)
Anion gap: 6 (ref 5–15)
BUN: 45 mg/dL — ABNORMAL HIGH (ref 8–23)
CO2: 20 mmol/L — ABNORMAL LOW (ref 22–32)
Calcium: 8.7 mg/dL — ABNORMAL LOW (ref 8.9–10.3)
Chloride: 102 mmol/L (ref 98–111)
Creatinine, Ser: 0.78 mg/dL (ref 0.61–1.24)
GFR, Estimated: >60 mL/min (ref >60)
Glucose, Bld: 209 mg/dL — ABNORMAL HIGH (ref 70–99)
Potassium: 4.1 mmol/L (ref 3.5–5.1)
Sodium: 128 mmol/L — ABNORMAL LOW (ref 135–145)
Total Bilirubin: 0.4 mg/dL (ref 0.3–1.2)
Total Protein: 6.6 g/dL (ref 6.5–8.1)

## 2021-07-11 LAB — CBC WITH DIFFERENTIAL/PLATELET
Abs Immature Granulocytes: 0.18 10*3/uL — ABNORMAL HIGH (ref 0.00–0.07)
Basophils Absolute: 0 10*3/uL (ref 0.0–0.1)
Basophils Relative: 0 %
Eosinophils Absolute: 0.1 10*3/uL (ref 0.0–0.5)
Eosinophils Relative: 0 %
HCT: 33.7 % — ABNORMAL LOW (ref 39.0–52.0)
Hemoglobin: 10.8 g/dL — ABNORMAL LOW (ref 13.0–17.0)
Immature Granulocytes: 1 %
Lymphocytes Relative: 20 %
Lymphs Abs: 2.9 10*3/uL (ref 0.7–4.0)
MCH: 27.1 pg (ref 26.0–34.0)
MCHC: 32 g/dL (ref 30.0–36.0)
MCV: 84.5 fL (ref 80.0–100.0)
Monocytes Absolute: 1.2 10*3/uL — ABNORMAL HIGH (ref 0.1–1.0)
Monocytes Relative: 9 %
Neutro Abs: 10.2 10*3/uL — ABNORMAL HIGH (ref 1.7–7.7)
Neutrophils Relative %: 70 %
Platelets: 387 10*3/uL (ref 150–400)
RBC: 3.99 MIL/uL — ABNORMAL LOW (ref 4.22–5.81)
RDW: 17.5 % — ABNORMAL HIGH (ref 11.5–15.5)
WBC: 14.6 10*3/uL — ABNORMAL HIGH (ref 4.0–10.5)
nRBC: 0 % (ref 0.0–0.2)

## 2021-07-11 LAB — GLUCOSE, CAPILLARY
Glucose-Capillary: 108 mg/dL — ABNORMAL HIGH (ref 70–99)
Glucose-Capillary: 189 mg/dL — ABNORMAL HIGH (ref 70–99)
Glucose-Capillary: 200 mg/dL — ABNORMAL HIGH (ref 70–99)
Glucose-Capillary: 202 mg/dL — ABNORMAL HIGH (ref 70–99)
Glucose-Capillary: 207 mg/dL — ABNORMAL HIGH (ref 70–99)
Glucose-Capillary: 220 mg/dL — ABNORMAL HIGH (ref 70–99)
Glucose-Capillary: 229 mg/dL — ABNORMAL HIGH (ref 70–99)

## 2021-07-11 LAB — MAGNESIUM: Magnesium: 1.9 mg/dL (ref 1.7–2.4)

## 2021-07-11 LAB — PHOSPHORUS: Phosphorus: 3.3 mg/dL (ref 2.5–4.6)

## 2021-07-11 MED ORDER — CHROMIC CHLORIDE 40 MCG/10ML IV SOLN
INTRAVENOUS | Status: AC
  Filled 2021-07-11: qty 534.2

## 2021-07-11 MED ORDER — PANTOPRAZOLE SODIUM 40 MG PO TBEC
40.0000 mg | DELAYED_RELEASE_TABLET | Freq: Every day | ORAL | Status: DC
  Administered 2021-07-11 – 2021-07-21 (×11): 40 mg via ORAL
  Filled 2021-07-11 (×12): qty 1

## 2021-07-11 MED ORDER — SODIUM CHLORIDE 0.9 % IV SOLN
INTRAVENOUS | Status: DC | PRN
  Administered 2021-07-11 – 2021-07-15 (×2): 250 mL via INTRAVENOUS

## 2021-07-11 NOTE — Progress Notes (Signed)
I saw the patient on 07/09/2021 at his bedside.  Patient with metastatic colorectal cancer who last received chemotherapy on 05/07/2021.  His colorectal cancer has been low-volume and generally slow-growing since 2018.  Patient went to [facility] and was hospitalized over there for cholecystitis which was complicated by small bowel injury requiring repair and prolonged hospitalization for 7 weeks.  He required an eye ileostomy subsequently complicated by an enterocutaneous fistula and was discharged on TPN at home.  Patient prior to [facility] hospitalization was active and independent of his ADLs with a PS of 1.  Since his complications he has been significantly deconditioned and was brought back to the hospital for concerns of leak around the ileostomy.  Patient has not been able to find placement with his ongoing TPN.  Palliative care had also seen him during his present hospitalization and discussed with family and at that time family wanted to continue TPN and were not interested in hospice.  After my visit with the patient I did speak to patient's son [name] on 07/10/2021.  I explained to him that overall patient appears to be doing poorly.  He is not even eating 50% of his calorie requirements with his oral feeds despite appetite stimulants.  It is unlikely that he will ever meet his calorie requirements without TPN.  However TPN long-term has its own complications and unlikely to improve his overall quality of life.  Given his underlying metastatic colon cancer, recent complications after cholecystitis, enterocutaneous fistula and a present performance status of 4 hospice would be a reasonable consideration at this time.  Patient's son is very clear that he would like his father to come home and spend his remainder of life at his own house.  Patient's sons are not in a position to care for him on a 24-hour basis and that patient will be going home to live alone.  He is willing to forego TPN once the  patient comes home and is in the process of hiring private home care 24 hours.  He understands that it would have considerable financial implications but they are willing to undertake that on their own.  Hospice services can go hand-in-hand with private home care but we will confirm this with hospice services as well.  Patient's son states that it would take him probably a week or longer to get this arranged at home before he can come home.  Patient's son also wants TPN to be continued while the patient is at the hospital.  He is concerned that patient may pass in the hospital if TPN is discontinued.   I have communicated all this to Dr. Dwyane Dee as well.  For now the plan would be to send patient home with private 24-hour home care services and hopefully home hospice without TPN sometime next week.  Palliative care and I will also reach out to the son in the next 1 to 2 days again to discuss patient's CODE STATUS as well.  Patient's overall prognosis is very poor and likely in days to weeks upon discharge.  Dr. Randa Evens, MD, MPH [facility] [phone] 07/11/2021 12:29 PM

## 2021-07-11 NOTE — Progress Notes (Signed)
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  He did had evidence of metastatic lung disease but is was able to be controlled with chemotherapy.  He presented to [facility] 6 weeks ago for acute cholecystitis and underwent laparoscopic cholecystectomy on May 16, 2021.  His hospitalization was complicated by missed bowel injury requiring exploratory laparotomy and multiple interventions.  Eventually the patient developed enterocutaneous fistula and was in the hospital for over 4 weeks.  Now he presents with issues regarding enterocutaneous fistula and the Eakin pouch leaking. I personally reviewed the CT scan and there is evidence of a small collections that given his primary pathology, general surgery will not attempt any percutaneous drainage.  This may lead to more complications given his EC fistula and friable bowel.   Patient admitted 06/26/2021 for problems with fistula and bag catching the stool.  Wound care has been able to get a bag that is doing the symptoms and catching his stool and only leaking intermittently.  The patient does have a cough and has a little interstitial edema and bilateral pleural effusions and that improved a little bit with IV Lasix and IV albumin.  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 [name] as outpatient.  Disposition has become very challenging.  The family is unable to provide the care that they feel the patient needs for home.  TPN is the major barrier to finding a accepting facility.  Per TOC she is reached out to 40+ facilities without an accepting one with the majority claiming that TPN is the barrier to admission.  I discussed this at length with patient and patient's son [name] on phone.  Current plan of  care is to attempt to wean off of TPN.  If we are able to do so and patient can meet the majority of his nutritional needs we can get him to a skilled nursing facility and he can seek surgical follow-up at tertiary care center.  As of 8/26 patient is starting to eat more.  We are tapering TPN.  Current rate 83 mL/h.  Taper down to 42 mL/h  8/27: Patient became altered and delirious last night.  Hospital-acquired delirium versus urinary tract infection.  Unfortunately patient self discontinued his PICC line and pulled his ileostomy.  Ileostomy is in place and functioning appropriately.  After discussion with the PICC team and the IV nurse and the bedside RN decision was made to heparinize and access patient's implanted port and administer TPN via this route.  This will avoid placement of another PICC line and will allow [place] to continue parenteral nutrition.  8/29: Patient meeting about 50 to 60% of his nutritional goals.  Needs to be 90% or greater in order for [place] to safely discontinue TPN.   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[place]) Active Problems:   Essential hypertension   Type 2 diabetes mellitus with hyperlipidemia (HCC)   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HCC)   Lung metastases (HCC)   Post cholecystectomy intraabdominal abscess   S/P cholecystectomy   On total parenteral nutrition (TPN)   Malnutrition of moderate degree   Overweight (BMI 25.0-29.9)   Enterocutaneous fistula   Anemia of chronic disease   Hyponatremia   Cough   Pleural effusion   Acute  metabolic encephalopathy   Palliative care encounter  Enterocutaneous fistula Multiple fluid collections on CT abdomen Extensive and complex surgical history Majority of care at outside facility in [facility] surgery consulted here They will facilitate outpatient evaluation and referral to tertiary care facility however will be 6 months before any intervention Currently no recommendations of  intervention on the abdomen Plan: Ostomy care per nursing and WOC  Moderate protein calorie malnutrition Currently on TPN.  Patient doing better with oral intake TPN weaned to half rate as of 8/26 Unfortunately patient pulled out PICC line on 8/26 Port accessed and TPN to be administered via this route Oral intake has been improving but still not meeting nutritional goals Plan: Calorie count completed Patient still not meeting his nutritional goals Continue maximizing oral intake Attempt to wean off TPN entirely within the next few days Instructions given to nursing staff.   Every single staff member that walks into the patient's room during the course of the day needs to encourage him to eat  Urinary tract infection Repeat urinalysis on 8/26 nitrite positive Unclear whether this represents true infection but will treat as such given the encephalopathy Urine culture with greater than 100,000 colony-forming units gram negative rods Resistant to Rocephin Plan: Continue IV Rocephin Plan for 7-day antibiotic course, (8/29--9/4) Monitor vitals and fever curve  Acute metabolic encephalopathy Baseline is unclear however concern the patient was confused.   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 [name] as outpatient No active oncology issues however palliative care nurse practitioner [name] was consulted for goals of care At this time patient and family not interested in de-escalation of care Outpatient follow-up  Hyponatremia Likely secondary to malnutrition and poor p.o. 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  At this point unclear to me whether patient would be safe at home environment.  I spoke at length to his son [name] and the patient lives alone and prior to his hospitalization at [facility] was apparently robust and fully independent.  There is been a significant decline in his functional status and at this point0 I am not sure that home environment would be safe disposition and would likely result in readmission.  At this point TPN is the major barrier to skilled nursing facility placement so we will attempt to wean from TPN if the patient is able to meet some of his nutritional goals.  Patient has been trying to eat more and has been receptive to encouragement from nursing staff  8/31 as per discussion with oncologist, patient's son is open for hospice care at home now and he is working to arrange 24/7 care at home.  In the meantime we will continue TPN as an inpatient, plan is to go home without TPN when 24/7 care will be arranged and hospice will be consulted as well.   DVT prophylaxis: SQ Lovenox Code Status: Full Family Communication: [name] [phone] on 8/24, 8/25, 8/27, 8/29.  Left VM on 8/30 Disposition Plan: Status is: Inpatient  Remains inpatient appropriate  because:Inpatient level of care appropriate due to severity of illness  Dispo: The patient is from: Home              Anticipated d/c is to: Home with Hospice care and 24/7 arrangement private pay              Patient currently is not medically stable to DC, needs 24/7 home care arrangement    Difficult to place patient No  Patient is not medically stable for discharge.  His nutritional status is poor and we are attempting to wean from TPN in order to secure a safe disposition plan to skilled nursing facility.  At this point in my medical opinion I feel  that disposition home is not safe.  He has a very complex surgical abdomen and an ostomy that frequently leaks and requires specific attention and care.   Level of care: Med-Surg  Consultants:  General surgery Oncologist  Procedures:  None  Antimicrobials:  None   Subjective: No significant overnight issues, patient was resting comfortably, denied any abdominal pain, no shortness of breath, no chest pain or palpitations.  Patient still has decreased appetite and does not feel like eating but he is trying.   Objective: Vitals:   07/10/21 2007 07/11/21 0423 07/11/21 0436 07/11/21 0745  BP: (!) 119/58 117/61  (!) 128/59  Pulse: 86 78  75  Resp: 18 18    Temp: 97.9 F (36.6 C) 97.7 F (36.5 C)  97.7 F (36.5 C)  TempSrc:      SpO2: 100% 100%  99%  Weight:   68.2 kg   Height:        Intake/Output Summary (Last 24 hours) at 07/11/2021 1249 Last data filed at 07/11/2021 1027 Gross per 24 hour  Intake 1365.01 ml  Output 1800 ml  Net -434.99 ml   Filed Weights   07/09/21 0624 07/10/21 0500 07/11/21 0436  Weight: 66.1 kg 66.8 kg 68.2 kg    Examination:  General exam: Awake.  No visible distress Respiratory system: Lung sounds decreased at bases.  Normal work of breathing.  Room air Cardiovascular system: S1-S2, regular rate and rhythm, no murmurs, no pedal edema  gastrointestinal system: Thin, nontender, nondistended, normal bowel sounds Central nervous system: Awake and alert, no focal deficits Extremities: Symmetrically decreased power bilaterally upper and lower extremities Skin: Pale with scattered excoriations Psychiatry: Judgement and insight appear impaired. Mood & affect flattened.     Data Reviewed: I have personally reviewed following labs and imaging studies  CBC: Recent Labs  Lab 07/06/21 1209 07/06/21 1815 07/06/21 2359 07/07/21 0601 07/11/21 0807  WBC  --   --   --   --  14.6*  NEUTROABS  --   --   --   --  10.2*  HGB 10.9* 10.7* 10.4* 11.6*  10.8*  HCT 36.3* 35.8* 34.0* 37.7* 33.7*  MCV  --   --   --   --  84.5  PLT  --   --   --   --  176   Basic Metabolic Panel: Recent Labs  Lab 07/05/21 0455 07/06/21 0459 07/07/21 0601 07/08/21 0514 07/11/21 0539  NA 133* 136 136 130* 128*  K 4.5 4.7 4.2 4.6 4.1  CL 105 110 107 103 102  CO2 20* 20* 20* 19* 20*  GLUCOSE 233* 133* 103* 251* 209*  BUN 77* 86* 76* 74* 45*  CREATININE 0.86 0.97 1.00 1.11 0.78  CALCIUM 10.3 10.0 9.7 9.5 8.7*  MG  --   --   --  2.2 1.9  PHOS  --   --   --  4.5 3.3   GFR: Estimated Creatinine Clearance: 65.4 mL/min (by C-G formula based on SCr of 0.78 mg/dL). Liver Function Tests: Recent Labs  Lab 07/08/21 0514 07/11/21 0539  AST 28 37  ALT 51* 53*  ALKPHOS 168* 147*  BILITOT 0.5 0.4  PROT 7.9 6.6  ALBUMIN 3.3* 2.8*   No results for input(s): LIPASE, AMYLASE in the last 168 hours. No results for input(s): AMMONIA in the last 168 hours. Coagulation Profile: No results for input(s): INR, PROTIME in the last 168 hours. Cardiac Enzymes: No results for input(s): CKTOTAL, CKMB, CKMBINDEX, TROPONINI in the last 168 hours. BNP (last 3 results) No results for input(s): PROBNP in the last 8760 hours. HbA1C: No results for input(s): HGBA1C in the last 72 hours. CBG: Recent Labs  Lab 07/10/21 2002 07/11/21 0008 07/11/21 0416 07/11/21 0743 07/11/21 1121  GLUCAP 327* 220* 108* 207* 200*   Lipid Profile: No results for input(s): CHOL, HDL, LDLCALC, TRIG, CHOLHDL, LDLDIRECT in the last 72 hours.   Thyroid Function Tests: No results for input(s): TSH, T4TOTAL, FREET4, T3FREE, THYROIDAB in the last 72 hours. Anemia Panel: No results for input(s): VITAMINB12, FOLATE, FERRITIN, TIBC, IRON, RETICCTPCT in the last 72 hours. Sepsis Labs: No results for input(s): PROCALCITON, LATICACIDVEN in the last 168 hours.   Recent Results (from the past 240 hour(s))  Urine Culture     Status: Abnormal   Collection Time: 07/05/21  4:25 PM   Specimen:  Urine, Random  Result Value Ref Range Status   Specimen Description   Final    URINE, RANDOM Performed at [facility], [address], [place], [place] [address]    Special Requests   Final    NONE Performed at [facility], [place], [place] [address]    Culture >=100,000 COLONIES/mL ESCHERICHIA COLI (A)  Final   Report Status 07/08/2021 FINAL  Final   Organism ID, Bacteria ESCHERICHIA COLI (A)  Final      Susceptibility   Escherichia coli - MIC*    AMPICILLIN >=32 RESISTANT Resistant     CEFAZOLIN >=64 RESISTANT Resistant     CEFEPIME <=0.12 SENSITIVE Sensitive     CEFTRIAXONE 32 RESISTANT Resistant     CIPROFLOXACIN 0.5 SENSITIVE Sensitive     GENTAMICIN <=1 SENSITIVE Sensitive     IMIPENEM <=0.25 SENSITIVE Sensitive     NITROFURANTOIN <=16 SENSITIVE Sensitive     TRIMETH/SULFA <=20 SENSITIVE Sensitive     AMPICILLIN/SULBACTAM 16 INTERMEDIATE Intermediate     PIP/TAZO <=4 SENSITIVE Sensitive     * >=100,000 COLONIES/mL ESCHERICHIA COLI         Radiology Studies: No results found.      Scheduled Meds:  aspirin EC  81 mg Oral Daily   benzonatate  200 mg Oral TID   Chlorhexidine Gluconate Cloth  6 each Topical Daily   dexamethasone  4 mg Oral Daily   dronabinol  5 mg Oral BID AC   feeding supplement  237 mL Oral TID BM   furosemide  20 mg Oral Daily   insulin aspart  0-15 Units Subcutaneous Q4H   insulin aspart  10 Units Subcutaneous Q4H   insulin glargine-yfgn  12 Units Subcutaneous BID   loratadine  10 mg Oral Daily   losartan  25 mg Oral Daily   mirtazapine  15 mg Oral QHS   pantoprazole  40 mg Oral Daily   pregabalin  75 mg Oral BID   sodium chloride flush  10-40 mL Intracatheter Q12H   Continuous Infusions:  ciprofloxacin 400 mg (07/11/21 0834)   TPN ADULT (ION) 42 mL/hr at 07/11/21 0450   TPN ADULT (ION)       LOS: 15 days    Time spent: 25 minutes    Val Riles, MD [facility] Pager [phone]  [phone]  If 7PM-7AM, please contact night-coverage 07/11/2021, 12:49 PM

## 2021-07-11 NOTE — Progress Notes (Signed)
[place]  [phone] [phone]   Name: Miguel Flores Date: 07/11/2021 MRN: [id number]  DOB: Aug 14, 1941  Patient Care Team: [name], MD as PCP - General (Family Medicine) Sindy Guadeloupe, MD as Consulting Physician (Hematology and Oncology)    REASON FOR CONSULTATION: Miguel Flores is a 80 y.o. 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  Patient was ultimately discharged home and was readmitted here on 06/26/2021 with an ileostomy leak.  CT was concerning for multiple abdominal abscesses.  Patient has been seen by general surgery and is not felt to be a current surgical candidate with recommendation for possible future surgical repair at a tertiary center when he is well enough.  Palliative care was consulted to address goals.  CODE STATUS: Full code  PAST MEDICAL HISTORY: Past Medical History:  Diagnosis Date   Cancer ([place])    skin cancer-nose w graft,also shoulder- basal cell per pt   Cataract    r eye   Chronic kidney disease    kidney stones 20 y ago per pt   Colon cancer ([place]) 2018   Surgical resection and chemo tx's.   Diabetes mellitus without complication ([place])    Eczema    History of kidney stones    Hyperlipidemia    Hypertension    neuropathy from chemo    Vertigo     PAST SURGICAL HISTORY:  Past Surgical History:  Procedure Laterality Date   CATARACT EXTRACTION Left    COLECTOMY WITH COLOSTOMY CREATION/HARTMANN PROCEDURE N/A 02/06/2017   Procedure: COLECTOMY WITH COLOSTOMY CREATION/HARTMANN PROCEDURE;  Surgeon: [name], MD;  Location: [facility] ORS;  Service: General;  Laterality: N/A;   COLON  SURGERY     COLONOSCOPY WITH PROPOFOL N/A 09/04/2017   Procedure: COLONOSCOPY WITH PROPOFOL;  Surgeon: Jonathon Bellows, MD;  Location: [facility] ENDOSCOPY;  Service: Gastroenterology;  Laterality: N/A;   COLOSTOMY CLOSURE N/A 10/20/2017   Procedure: COLOSTOMY CLOSURE;  Surgeon: [name], MD;  Location: [facility] ORS;  Service: General;  Laterality: N/A;   CYSTOSCOPY     FLEXIBLE SIGMOIDOSCOPY N/A 02/06/2017   Procedure: FLEXIBLE SIGMOIDOSCOPY;  Surgeon: Christene Lye, MD;  Location: [facility] ENDOSCOPY;  Service: Endoscopy;  Laterality: N/A;   PORTACATH PLACEMENT N/A 06/17/2018   Procedure: INSERTION PORT-A-CATH, WITH FLUOROSCOPY;  Surgeon: [name], MD;  Location: [facility] ORS;  Service: General;  Laterality: N/A;    HEMATOLOGY/ONCOLOGY HISTORY:  Oncology History  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[place])  02/20/2017 Initial Diagnosis   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[place])   06/22/2018 - 08/04/2019 Chemotherapy   The patient had dexamethasone (DECADRON) 4 MG tablet, 8 mg, Oral, Daily, 1 of 1 cycle, Start date: 06/18/2018, End date: 06/22/2018 palonosetron (ALOXI) injection 0.25 mg, 0.25 mg, Intravenous,  Once, 25 of 25 cycles Administration: 0.25 mg (06/22/2018), 0.25 mg (07/06/2018), 0.25 mg (07/27/2018), 0.25 mg (08/10/2018), 0.25 mg (08/24/2018), 0.25 mg (09/07/2018), 0.25 mg (09/21/2018), 0.25 mg (10/12/2018), 0.25 mg (10/26/2018), 0.25 mg (11/09/2018), 0.25 mg (11/23/2018), 0.25 mg (12/07/2018), 0.25 mg (12/28/2018), 0.25 mg (01/18/2019), 0.25 mg (02/08/2019), 0.25 mg (03/22/2019), 0.25 mg (03/01/2019), 0.25 mg (04/12/2019), 0.25 mg (05/03/2019), 0.25 mg (05/17/2019), 0.25 mg (05/31/2019), 0.25 mg (06/14/2019), 0.25 mg (06/28/2019), 0.25 mg (07/19/2019), 0.25 mg (08/02/2019) pegfilgrastim-cbqv (UDENYCA) injection 6 mg,  6 mg, Subcutaneous, Once, 19 of 19 cycles Administration: 6 mg (07/29/2018), 6 mg (08/26/2018), 6 mg (09/09/2018), 6 mg (09/23/2018), 6 mg (10/14/2018), 6 mg (10/28/2018), 6 mg (11/11/2018), 6 mg (11/25/2018), 6 mg (12/09/2018), 6  mg (12/30/2018), 6 mg (03/03/2019), 6 mg (05/05/2019), 6 mg (05/19/2019), 6 mg (06/02/2019), 6 mg (06/16/2019), 6 mg (06/30/2019), 6 mg (07/21/2019), 6 mg (08/04/2019) bevacizumab (AVASTIN) 450 mg in sodium chloride 0.9 % 100 mL chemo infusion, 5 mg/kg = 450 mg, Intravenous,  Once, 21 of 21 cycles Administration: 450 mg (07/06/2018), 450 mg (07/27/2018), 450 mg (08/10/2018), 450 mg (08/24/2018), 450 mg (09/07/2018), 450 mg (09/21/2018), 450 mg (10/12/2018), 450 mg (10/26/2018), 450 mg (11/09/2018), 450 mg (11/23/2018), 450 mg (12/07/2018), 400 mg (12/28/2018), 400 mg (02/08/2019), 400 mg (03/22/2019), 400 mg (03/01/2019), 400 mg (04/12/2019), 400 mg (05/03/2019), 400 mg (05/17/2019), 400 mg (05/31/2019), 400 mg (06/14/2019) leucovorin 800 mg in dextrose 5 % 250 mL infusion, 808 mg, Intravenous,  Once, 12 of 12 cycles Administration: 800 mg (06/22/2018), 800 mg (07/06/2018), 800 mg (07/27/2018), 800 mg (08/10/2018), 800 mg (08/24/2018), 800 mg (09/07/2018), 800 mg (09/21/2018), 800 mg (10/12/2018), 800 mg (10/26/2018), 800 mg (11/09/2018), 800 mg (11/23/2018), 800 mg (12/07/2018) oxaliplatin (ELOXATIN) 170 mg in dextrose 5 % 500 mL chemo infusion, 85 mg/m2 = 170 mg, Intravenous,  Once, 12 of 12 cycles Dose modification: 65 mg/m2 (original dose 85 mg/m2, Cycle 6, Reason: Other (see comments), Comment: thrombocytopenia) Administration: 170 mg (06/22/2018), 170 mg (07/06/2018), 170 mg (07/27/2018), 170 mg (08/10/2018), 170 mg (08/24/2018), 130 mg (09/07/2018), 130 mg (09/21/2018), 130 mg (10/12/2018), 130 mg (10/26/2018), 130 mg (11/09/2018), 130 mg (11/23/2018), 130 mg (12/07/2018) fluorouracil (ADRUCIL) chemo injection 800 mg, 400 mg/m2 = 800 mg, Intravenous,  Once, 25 of 25 cycles Administration: 800 mg (06/22/2018), 800 mg (07/06/2018), 800 mg (07/27/2018), 800 mg (08/10/2018), 800 mg (08/24/2018), 800 mg (09/07/2018), 800 mg (09/21/2018), 800 mg (10/12/2018), 800 mg (10/26/2018), 800 mg (11/09/2018), 800 mg (11/23/2018), 800 mg (12/07/2018), 800 mg  (12/28/2018), 800 mg (01/18/2019), 800 mg (02/08/2019), 800 mg (03/22/2019), 800 mg (03/01/2019), 800 mg (04/12/2019), 800 mg (05/03/2019), 800 mg (05/17/2019), 800 mg (05/31/2019), 800 mg (06/14/2019), 800 mg (06/28/2019), 800 mg (07/19/2019), 800 mg (08/02/2019) leucovorin injection 40 mg, 20 mg/m2 = 40 mg (100 % of original dose 20 mg/m2), Intravenous,  Once, 13 of 13 cycles Dose modification: 20 mg/m2 (original dose 20 mg/m2, Cycle 13, Reason: Other (see comments), Comment: iv push) Administration: 40 mg (12/28/2018), 40 mg (01/18/2019), 40 mg (02/08/2019), 40 mg (03/22/2019), 40 mg (03/01/2019), 40 mg (04/12/2019), 40 mg (05/03/2019), 40 mg (05/17/2019), 40 mg (05/31/2019), 40 mg (06/14/2019), 40 mg (06/28/2019), 40 mg (07/19/2019), 40 mg (08/02/2019) fluorouracil (ADRUCIL) 4,850 mg in sodium chloride 0.9 % 53 mL chemo infusion, 2,400 mg/m2 = 4,850 mg, Intravenous, 1 Day/Dose, 25 of 25 cycles Administration: 4,850 mg (06/22/2018), 4,850 mg (07/06/2018), 4,850 mg (07/27/2018), 4,850 mg (08/10/2018), 4,850 mg (08/24/2018), 4,850 mg (09/07/2018), 4,850 mg (09/21/2018), 4,850 mg (10/12/2018), 4,850 mg (10/26/2018), 4,850 mg (11/09/2018), 4,850 mg (11/23/2018), 4,850 mg (12/07/2018), 4,850 mg (12/28/2018), 4,850 mg (01/18/2019), 4,850 mg (02/08/2019), 4,850 mg (03/22/2019), 4,850 mg (03/01/2019), 4,850 mg (04/12/2019), 4,850 mg (05/03/2019), 4,850 mg (05/17/2019), 4,850 mg (05/31/2019), 4,850 mg (06/14/2019), 4,850 mg (06/28/2019), 4,850 mg (07/19/2019), 4,850 mg (08/02/2019) bevacizumab-awwb (MVASI) 400 mg in sodium chloride 0.9 % 100 mL chemo infusion, 450 mg (100 % of original dose 5 mg/kg), Intravenous,  Once, 3 of 3 cycles Dose modification: 5 mg/kg (original dose 5 mg/kg, Cycle 23, Reason: Other (  see comments), Comment: biosimilar) Administration: 400 mg (06/28/2019), 400 mg (07/19/2019), 400 mg (08/02/2019)   for chemotherapy treatment.     08/16/2019 -  Chemotherapy    Patient is on Treatment Plan: COLORECTAL FOLFIRI / BEVACIZUMAB Q14D       Lung  metastases ([place])  06/18/2018 Initial Diagnosis   Lung metastases ([place])   06/22/2018 - 08/04/2019 Chemotherapy   The patient had dexamethasone (DECADRON) 4 MG tablet, 8 mg, Oral, Daily, 1 of 1 cycle, Start date: 06/18/2018, End date: 06/22/2018 palonosetron (ALOXI) injection 0.25 mg, 0.25 mg, Intravenous,  Once, 25 of 25 cycles Administration: 0.25 mg (06/22/2018), 0.25 mg (07/06/2018), 0.25 mg (07/27/2018), 0.25 mg (08/10/2018), 0.25 mg (08/24/2018), 0.25 mg (09/07/2018), 0.25 mg (09/21/2018), 0.25 mg (10/12/2018), 0.25 mg (10/26/2018), 0.25 mg (11/09/2018), 0.25 mg (11/23/2018), 0.25 mg (12/07/2018), 0.25 mg (12/28/2018), 0.25 mg (01/18/2019), 0.25 mg (02/08/2019), 0.25 mg (03/22/2019), 0.25 mg (03/01/2019), 0.25 mg (04/12/2019), 0.25 mg (05/03/2019), 0.25 mg (05/17/2019), 0.25 mg (05/31/2019), 0.25 mg (06/14/2019), 0.25 mg (06/28/2019), 0.25 mg (07/19/2019), 0.25 mg (08/02/2019) pegfilgrastim-cbqv (UDENYCA) injection 6 mg, 6 mg, Subcutaneous, Once, 19 of 19 cycles Administration: 6 mg (07/29/2018), 6 mg (08/26/2018), 6 mg (09/09/2018), 6 mg (09/23/2018), 6 mg (10/14/2018), 6 mg (10/28/2018), 6 mg (11/11/2018), 6 mg (11/25/2018), 6 mg (12/09/2018), 6 mg (12/30/2018), 6 mg (03/03/2019), 6 mg (05/05/2019), 6 mg (05/19/2019), 6 mg (06/02/2019), 6 mg (06/16/2019), 6 mg (06/30/2019), 6 mg (07/21/2019), 6 mg (08/04/2019) bevacizumab (AVASTIN) 450 mg in sodium chloride 0.9 % 100 mL chemo infusion, 5 mg/kg = 450 mg, Intravenous,  Once, 21 of 21 cycles Administration: 450 mg (07/06/2018), 450 mg (07/27/2018), 450 mg (08/10/2018), 450 mg (08/24/2018), 450 mg (09/07/2018), 450 mg (09/21/2018), 450 mg (10/12/2018), 450 mg (10/26/2018), 450 mg (11/09/2018), 450 mg (11/23/2018), 450 mg (12/07/2018), 400 mg (12/28/2018), 400 mg (02/08/2019), 400 mg (03/22/2019), 400 mg (03/01/2019), 400 mg (04/12/2019), 400 mg (05/03/2019), 400 mg (05/17/2019), 400 mg (05/31/2019), 400 mg (06/14/2019) leucovorin 800 mg in dextrose 5 % 250 mL infusion, 808 mg, Intravenous,  Once, 12 of 12  cycles Administration: 800 mg (06/22/2018), 800 mg (07/06/2018), 800 mg (07/27/2018), 800 mg (08/10/2018), 800 mg (08/24/2018), 800 mg (09/07/2018), 800 mg (09/21/2018), 800 mg (10/12/2018), 800 mg (10/26/2018), 800 mg (11/09/2018), 800 mg (11/23/2018), 800 mg (12/07/2018) oxaliplatin (ELOXATIN) 170 mg in dextrose 5 % 500 mL chemo infusion, 85 mg/m2 = 170 mg, Intravenous,  Once, 12 of 12 cycles Dose modification: 65 mg/m2 (original dose 85 mg/m2, Cycle 6, Reason: Other (see comments), Comment: thrombocytopenia) Administration: 170 mg (06/22/2018), 170 mg (07/06/2018), 170 mg (07/27/2018), 170 mg (08/10/2018), 170 mg (08/24/2018), 130 mg (09/07/2018), 130 mg (09/21/2018), 130 mg (10/12/2018), 130 mg (10/26/2018), 130 mg (11/09/2018), 130 mg (11/23/2018), 130 mg (12/07/2018) fluorouracil (ADRUCIL) chemo injection 800 mg, 400 mg/m2 = 800 mg, Intravenous,  Once, 25 of 25 cycles Administration: 800 mg (06/22/2018), 800 mg (07/06/2018), 800 mg (07/27/2018), 800 mg (08/10/2018), 800 mg (08/24/2018), 800 mg (09/07/2018), 800 mg (09/21/2018), 800 mg (10/12/2018), 800 mg (10/26/2018), 800 mg (11/09/2018), 800 mg (11/23/2018), 800 mg (12/07/2018), 800 mg (12/28/2018), 800 mg (01/18/2019), 800 mg (02/08/2019), 800 mg (03/22/2019), 800 mg (03/01/2019), 800 mg (04/12/2019), 800 mg (05/03/2019), 800 mg (05/17/2019), 800 mg (05/31/2019), 800 mg (06/14/2019), 800 mg (06/28/2019), 800 mg (07/19/2019), 800 mg (08/02/2019) leucovorin injection 40 mg, 20 mg/m2 = 40 mg (100 % of original dose 20 mg/m2), Intravenous,  Once, 13 of 13 cycles Dose modification: 20 mg/m2 (original dose 20 mg/m2, Cycle 13, Reason: Other (see comments), Comment: iv  push) Administration: 40 mg (12/28/2018), 40 mg (01/18/2019), 40 mg (02/08/2019), 40 mg (03/22/2019), 40 mg (03/01/2019), 40 mg (04/12/2019), 40 mg (05/03/2019), 40 mg (05/17/2019), 40 mg (05/31/2019), 40 mg (06/14/2019), 40 mg (06/28/2019), 40 mg (07/19/2019), 40 mg (08/02/2019) fluorouracil (ADRUCIL) 4,850 mg in sodium chloride 0.9 % 53 mL  chemo infusion, 2,400 mg/m2 = 4,850 mg, Intravenous, 1 Day/Dose, 25 of 25 cycles Administration: 4,850 mg (06/22/2018), 4,850 mg (07/06/2018), 4,850 mg (07/27/2018), 4,850 mg (08/10/2018), 4,850 mg (08/24/2018), 4,850 mg (09/07/2018), 4,850 mg (09/21/2018), 4,850 mg (10/12/2018), 4,850 mg (10/26/2018), 4,850 mg (11/09/2018), 4,850 mg (11/23/2018), 4,850 mg (12/07/2018), 4,850 mg (12/28/2018), 4,850 mg (01/18/2019), 4,850 mg (02/08/2019), 4,850 mg (03/22/2019), 4,850 mg (03/01/2019), 4,850 mg (04/12/2019), 4,850 mg (05/03/2019), 4,850 mg (05/17/2019), 4,850 mg (05/31/2019), 4,850 mg (06/14/2019), 4,850 mg (06/28/2019), 4,850 mg (07/19/2019), 4,850 mg (08/02/2019) bevacizumab-awwb (MVASI) 400 mg in sodium chloride 0.9 % 100 mL chemo infusion, 450 mg (100 % of original dose 5 mg/kg), Intravenous,  Once, 3 of 3 cycles Dose modification: 5 mg/kg (original dose 5 mg/kg, Cycle 23, Reason: Other (see comments), Comment: biosimilar) Administration: 400 mg (06/28/2019), 400 mg (07/19/2019), 400 mg (08/02/2019)   for chemotherapy treatment.     08/16/2019 -  Chemotherapy    Patient is on Treatment Plan: COLORECTAL FOLFIRI / BEVACIZUMAB Q14D         ALLERGIES:  has No Known Allergies.  MEDICATIONS:  Current Facility-Administered Medications  Medication Dose Route Frequency Provider Last Rate Last Admin   acetaminophen (TYLENOL) tablet 650 mg  650 mg Oral Q6H PRN Andris Baumann, MD   650 mg at 07/05/21 2141   Or   acetaminophen (TYLENOL) suppository 650 mg  650 mg Rectal Q6H PRN Andris Baumann, MD       aspirin EC tablet 81 mg  81 mg Oral Daily Alberteen Sam, MD   81 mg at 07/11/21 9215   benzonatate (TESSALON) capsule 200 mg  200 mg Oral TID Alford Highland, MD   200 mg at 07/11/21 1582   Chlorhexidine Gluconate Cloth 2 % PADS 6 each  6 each Topical Daily Andris Baumann, MD   6 each at 07/11/21 6587   ciprofloxacin (CIPRO) IVPB 400 mg  400 mg Intravenous Q12H Lolita Patella B, MD 200 mL/hr at 07/11/21 0834 400 mg  at 07/11/21 0834   dexamethasone (DECADRON) tablet 4 mg  4 mg Oral Daily Lolita Patella B, MD   4 mg at 07/11/21 0836   dronabinol (MARINOL) capsule 5 mg  5 mg Oral BID AC Sreenath, Sudheer B, MD   5 mg at 07/11/21 1259   feeding supplement (ENSURE ENLIVE / ENSURE PLUS) liquid 237 mL  237 mL Oral TID BM Sreenath, Sudheer B, MD   237 mL at 07/11/21 1302   furosemide (LASIX) tablet 20 mg  20 mg Oral Daily Alford Highland, MD   20 mg at 07/11/21 0832   guaiFENesin-dextromethorphan (ROBITUSSIN DM) 100-10 MG/5ML syrup 5 mL  5 mL Oral Q4H PRN Andris Baumann, MD   5 mL at 06/30/21 2144   haloperidol lactate (HALDOL) injection 2 mg  2 mg Intramuscular Q6H PRN Mansy, Jan A, MD   2 mg at 07/09/21 2045   insulin aspart (novoLOG) injection 0-15 Units  0-15 Units Subcutaneous Q4H Andris Baumann, MD   3 Units at 07/11/21 1259   insulin aspart (novoLOG) injection 10 Units  10 Units Subcutaneous Q4H Lolita Patella B, MD   10 Units at 07/11/21 1259  insulin glargine-yfgn (SEMGLEE) injection 12 Units  12 Units Subcutaneous BID Ralene Muskrat B, MD   12 Units at 07/11/21 1700   loperamide (IMODIUM) capsule 2 mg  2 mg Oral PRN Annita Brod, MD       loratadine (CLARITIN) tablet 10 mg  10 mg Oral Daily Athena Masse, MD   10 mg at 07/11/21 0831   losartan (COZAAR) tablet 25 mg  25 mg Oral Daily Athena Masse, MD   25 mg at 07/11/21 1749   mirtazapine (REMERON) tablet 15 mg  15 mg Oral QHS Ralene Muskrat B, MD   15 mg at 07/10/21 2019   ondansetron (ZOFRAN) tablet 4 mg  4 mg Oral Q6H PRN Athena Masse, MD       Or   ondansetron [facility]) injection 4 mg  4 mg Intravenous Q6H PRN Athena Masse, MD   4 mg at 06/30/21 0545   pantoprazole (PROTONIX) EC tablet 40 mg  40 mg Oral Daily Val Riles, MD   40 mg at 07/11/21 1259   pregabalin (LYRICA) capsule 75 mg  75 mg Oral BID Athena Masse, MD   75 mg at 07/11/21 0831   sodium chloride flush (NS) 0.9 % injection 10-40 mL  10-40 mL  Intracatheter Q12H Loletha Grayer, MD   10 mL at 07/11/21 4496   sodium chloride flush (NS) 0.9 % injection 10-40 mL  10-40 mL Intracatheter PRN Loletha Grayer, MD   10 mL at 07/05/21 1031   TPN ADULT (ION)   Intravenous Continuous TPN Dallie Piles, RPH 42 mL/hr at 07/11/21 0450 Infusion Verify at 07/11/21 0450   TPN ADULT (ION)   Intravenous Continuous TPN Dallie Piles, RPH       Facility-Administered Medications Ordered in Other Encounters  Medication Dose Route Frequency Provider Last Rate Last Admin   0.9 %  sodium chloride infusion   Intravenous Continuous Sindy Guadeloupe, MD   Stopped at 07/27/18 1037   heparin lock flush 100 unit/mL  500 Units Intravenous Once Sindy Guadeloupe, MD       sodium chloride flush (NS) 0.9 % injection 10 mL  10 mL Intravenous PRN Sindy Guadeloupe, MD   10 mL at 07/27/18 0920   sodium chloride flush (NS) 0.9 % injection 10 mL  10 mL Intravenous Once Sindy Guadeloupe, MD        VITAL SIGNS: BP (!) 128/59 (BP Location: Right Arm)   Pulse 75   Temp 97.7 F (36.5 C)   Resp 18   Ht 5' 5.98" (1.676 m)   Wt 150 lb 5.7 oz (68.2 kg)   SpO2 99%   BMI 24.28 kg/m  Filed Weights   07/09/21 0624 07/10/21 0500 07/11/21 0436  Weight: 145 lb 11.6 oz (66.1 kg) 147 lb 4.3 oz (66.8 kg) 150 lb 5.7 oz (68.2 kg)    Estimated body mass index is 24.28 kg/m as calculated from the following:   Height as of this encounter: 5' 5.98" (1.676 m).   Weight as of this encounter: 150 lb 5.7 oz (68.2 kg).  LABS: CBC:    Component Value Date/Time   WBC 14.6 (H) 07/11/2021 0807   HGB 10.8 (L) 07/11/2021 0807   HCT 33.7 (L) 07/11/2021 0807   PLT 387 07/11/2021 0807   MCV 84.5 07/11/2021 0807   NEUTROABS 10.2 (H) 07/11/2021 0807   LYMPHSABS 2.9 07/11/2021 0807   MONOABS 1.2 (H) 07/11/2021 0807   EOSABS 0.1 07/11/2021  0807   BASOSABS 0.0 07/11/2021 0807   Comprehensive Metabolic Panel:    Component Value Date/Time   NA 128 (L) 07/11/2021 0539   K 4.1 07/11/2021 0539    CL 102 07/11/2021 0539   CO2 20 (L) 07/11/2021 0539   BUN 45 (H) 07/11/2021 0539   CREATININE 0.78 07/11/2021 0539   GLUCOSE 209 (H) 07/11/2021 0539   CALCIUM 8.7 (L) 07/11/2021 0539   AST 37 07/11/2021 0539   ALT 53 (H) 07/11/2021 0539   ALKPHOS 147 (H) 07/11/2021 0539   BILITOT 0.4 07/11/2021 0539   PROT 6.6 07/11/2021 0539   ALBUMIN 2.8 (L) 07/11/2021 0539    RADIOGRAPHIC STUDIES: DG Chest 2 View  Result Date: 06/30/2021 CLINICAL DATA:  Cough for 2 months. EXAM: CHEST - 2 VIEW COMPARISON:  CT of the chest on 06/12/2021, CT of the chest, abdomen, and pelvis on 03/13/2021 FINDINGS: Patient's LEFT-sided power port, tip overlying the level of the UPPER RIGHT atrium. RIGHT-sided PICC line tip overlies the superior vena cava. Heart is mildly enlarged. There are chronic Kerley B-lines in the lung bases, LEFT greater than RIGHT. Bilateral pleural effusions are present. RIGHT LOWER lobe mass measures 1.8 centimeters. Bibasilar atelectasis or infiltrates. IMPRESSION: Bilateral pleural effusions and bibasilar infiltrates. 1. Mild interstitial edema. 2. RIGHT LOWER lobe mass again noted. LEFT lingular mass is not as well seen as on recent CT. Electronically Signed   By: Nolon Nations M.D.   On: 06/30/2021 12:21   CT ABDOMEN PELVIS W CONTRAST  Result Date: 06/26/2021 CLINICAL DATA:  Ileostomy bag leakage EXAM: CT ABDOMEN AND PELVIS WITH CONTRAST TECHNIQUE: Multidetector CT imaging of the abdomen and pelvis was performed using the standard protocol following bolus administration of intravenous contrast. CONTRAST:  24mL OMNIPAQUE IOHEXOL 350 MG/ML SOLN COMPARISON:  CT 03/13/2021, 11/28/2020 FINDINGS: Lower chest: Lung bases demonstrate small bilateral pleural effusions. Atelectasis or minimal pneumonia at the bases. 16 x 14 mm right lower lobe pulmonary nodule, previously 14 x 13 mm. Irregular lingular pulmonary nodule measuring 13 x 10 mm, previously 16 x 12 mm. Normal cardiac size. Hepatobiliary:  Status post cholecystectomy. No focal hepatic abnormality. No intra or extrahepatic biliary dilatation Pancreas: Unremarkable. No pancreatic ductal dilatation or surrounding inflammatory changes. Spleen: Normal in size without focal abnormality. Adrenals/Urinary Tract: Adrenal glands are normal. Kidneys show no hydronephrosis. The bladder is normal Stomach/Bowel: The stomach is nonenlarged. Status post distal left colectomy with reanastomosis. Collapsed appearing colon. Contrast opacifies nondilated small bowel. Interval postsurgical changes of the small bowel. Large ventral wound containing contrast which appears to communicate with thickened small bowel deep to the wound, series 2, image 61. Vascular/Lymphatic: Moderate aortic atherosclerosis. No aneurysm. No suspicious nodes. Reproductive: Prostate calcifications without mass Other: No free air. Multiple mildly rim enhancing intra-abdominal fluid collections. 5 x 2.6 cm right mid abdominal collection, series 2, image 44. 2.4 x 1.8 cm fluid collection in the right mid lateral abdomen adjacent to small bowel, series 2, image 47 5.2 x 1.3 cm lenticular shaped collection within the left mid abdomen, coronal series 6, image 32. Multiple additional smaller rim enhancing collections within the abdomen concerning for abscess. Small rim enhancing perihepatic and perisplenic fluid collections. generalized soft tissue stranding within the mesentery which is new compared to prior. Musculoskeletal: No acute or significant osseous findings. IMPRESSION: 1. Large ventral wound containing small amount of contrast. Postsurgical changes of small bowel deep to the wound with enteral contrast from the small bowel extending into the large ventral wound suggesting  enterocutaneous fistula. 2. Multiple intra-abdominal rim enhancing fluid collections as described above concerning for intra-abdominal abscess. Generalized soft tissue stranding throughout the mesentery, likely inflammatory.  3. Small bilateral pleural effusions with atelectasis or pneumonia at the bases. Metastatic lung nodules, slight increased size of right lower lobe metastatic nodule. Electronically Signed   By: Donavan Foil M.D.   On: 06/26/2021 19:03   DG Humerus Right  Result Date: 07/06/2021 CLINICAL DATA:  Foreign body, missing PICC line tip EXAM: RIGHT HUMERUS - 2+ VIEW COMPARISON:  None. FINDINGS: No fracture or dislocation is seen. Degenerative changes of the acromioclavicular joint. Visualized soft tissues are within normal limits. No radiopaque foreign body is seen. IMPRESSION: No radiopaque foreign body is seen. Electronically Signed   By: Julian Hy M.D.   On: 07/06/2021 03:23   [place] EKG SITE RITE  Result Date: 07/06/2021 If [facility] image not attached, placement could not be confirmed due to current cardiac rhythm.  DG Outside Films Chest  Result Date: 06/27/2021 This examination belongs to an outside facility and is stored here for comparison purposes only.  Contact the originating outside institution for any associated report or interpretation.  DG Outside Films Chest  Result Date: 06/27/2021 This examination belongs to an outside facility and is stored here for comparison purposes only.  Contact the originating outside institution for any associated report or interpretation.  DG Outside Films Chest  Result Date: 06/27/2021 This examination belongs to an outside facility and is stored here for comparison purposes only.  Contact the originating outside institution for any associated report or interpretation.   PERFORMANCE STATUS (ECOG) : 4 - Bedbound  Review of Systems Unable to complete  Physical Exam General: NAD Pulmonary: Unlabored Extremities: no edema, no joint deformities Skin: no rashes Neurological: Sleeping  IMPRESSION: I met with patient's son, [name].  He reports the patient is improving slowly and eating about 25-50% of 1-2 meals/day.  However, patient remains on  TPN.  Patient is ambulatory with PT but still far from baseline prior to this hospitalization.  Son says that family is working on plan to take patient home.  They plan to use a combination of hired sitters and home health RN to manage the ileostomy.  He says that family has vacillated some on the decision on whether to continue TPN.  We again discussed the option of hospice in the event that TPN is discontinued.    Son says that he anticipates that patient is nearing end-of-life.  Once home, he thinks family would likely opt to focus on just keeping patient comfortable.  We discussed a future option of a hospice facility for care but son thinks that patient would prefer to die at home.  We discussed CODE STATUS.  Son would prefer patient to remain a full code while in the hospital but plans to take patient home as a "DNR".  I explained that he will need to request a signed DNR order at time of discharge.  PLAN: -Continue current scope of treatment -Family working on discharge plan -Family would like to change CODE STATUS to DNR at time of discharge from the hospital.  However, patient remains a full code for now    Time Total: 45 minutes  Visit consisted of counseling and education dealing with the complex and emotionally intense issues of symptom management and palliative care in the setting of serious and potentially life-threatening illness.Greater than 50%  of this time was spent counseling and coordinating care related to the above  assessment and plan.  Signed by: [name], PhD, NP-C

## 2021-07-11 NOTE — Plan of Care (Signed)
  Problem: Clinical Measurements: Goal: Ability to maintain clinical measurements within normal limits will improve Outcome: Progressing Goal: Will remain free from infection Outcome: Progressing Goal: Diagnostic test results will improve Outcome: Progressing Goal: Respiratory complications will improve Outcome: Progressing Goal: Cardiovascular complication will be avoided Outcome: Progressing   Problem: Pain Managment: Goal: General experience of comfort will improve Outcome: Progressing   Pt is alert and oriented x 3; forgetful. V/S stable. NO complaints of pain. Fistula/ileostomy bag changed 1x last night and 1x this morning. Output has bld in it. Dr informed. Had ensure and gelatin this shift.

## 2021-07-11 NOTE — Progress Notes (Signed)
Physical Therapy Treatment Patient Details Name: Miguel Flores MRN: [id number] DOB: Nov 06, 1941 Today's Date: 07/11/2021    History of Present Illness 80 y.o. 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 [name] who presented with draining enterocutaneous fistula.     Patient has a complicated recent abdominal surgery history (see Dr. Deniece Ree note from 8/17) that began 6 weeks ago with cholecystitis at a hospital in [facility].  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  He ultimately went home, initially doing well and then started having issues with the ileostomy.    PT Comments    Patient received in bed, son at bedside. Patient is agreeable to PT session. Requires min assist for supine to sit. Min assist for sit to stand and min guard for ambulation with RW 200 feet. Patient limited by fatigue, requiring one standing rest break. Patient will continue to benefit from skilled PT while here to improve safety and strength.      Follow Up Recommendations  SNF     Equipment Recommendations  Rolling walker with 5" wheels;3in1 (PT)    Recommendations for Other Services       Precautions / Restrictions Precautions Precautions: Fall Precaution Comments: ostomy (plus needs to wear ostomy belt) and IV's; L chest port; L midline Restrictions Weight Bearing Restrictions: No    Mobility  Bed Mobility Overal bed mobility: Needs Assistance Bed Mobility: Supine to Sit     Supine to sit: Min assist     General bed mobility comments: patient requires assist to raise trunk to seated position    Transfers Overall transfer level: Needs assistance Equipment used: Rolling walker (2 wheeled) Transfers: Sit to/from Stand Sit to Stand: Min assist         General transfer comment:  cues for hand placement, but then son tells him to put both hands on walker.  Ambulation/Gait Ambulation/Gait assistance: Min guard Gait Distance (Feet): 200 Feet Assistive device: Rolling walker (2 wheeled) Gait Pattern/deviations: Step-through pattern;Decreased step length - right;Decreased step length - left Gait velocity: decreased   General Gait Details: patient with reduced step length, limited by fatigue, one brief standing rest break   Stairs             Wheelchair Mobility    Modified Rankin (Stroke Patients Only)       Balance Overall balance assessment: Needs assistance Sitting-balance support: Feet supported Sitting balance-Leahy Scale: Good     Standing balance support: Bilateral upper extremity supported;During functional activity Standing balance-Leahy Scale: Fair Standing balance comment: pt requiring at least single UE support for static standing balance                            Cognition Arousal/Alertness: Awake/alert Behavior During Therapy: Flat affect Overall Cognitive Status: Impaired/Different from baseline Area of Impairment: Memory                 Orientation Level: Disoriented to;Time Current Attention Level: Sustained Memory: Decreased recall of precautions;Decreased short-term memory Following Commands: Follows one step commands with increased time Safety/Judgement: Decreased awareness of safety;Decreased awareness of deficits   Problem Solving: Slow processing;Decreased initiation;Requires verbal cues;Requires tactile cues General Comments: difficult to understand speech at times      Exercises  General Comments        Pertinent Vitals/Pain Pain Assessment: No/denies pain    Home Living                      Prior Function            PT Goals (current goals can now be found in the care plan section) Acute Rehab PT Goals Patient Stated Goal: to improve strength and mobility PT Goal  Formulation: With patient/family Time For Goal Achievement: 07/25/21 Potential to Achieve Goals: Good Progress towards PT goals: Progressing toward goals    Frequency    Min 2X/week      PT Plan Current plan remains appropriate    Co-evaluation              AM-PAC PT "6 Clicks" Mobility   Outcome Measure  Help needed turning from your back to your side while in a flat bed without using bedrails?: A Little Help needed moving from lying on your back to sitting on the side of a flat bed without using bedrails?: A Little Help needed moving to and from a bed to a chair (including a wheelchair)?: A Little Help needed standing up from a chair using your arms (e.g., wheelchair or bedside chair)?: A Little Help needed to walk in hospital room?: A Little Help needed climbing 3-5 steps with a railing? : A Lot 6 Click Score: 17    End of Session Equipment Utilized During Treatment: Gait belt Activity Tolerance: Patient limited by fatigue Patient left: in chair;with call bell/phone within reach;with family/visitor present Nurse Communication: Mobility status;Other (comment) (son mentioned they wanted to weigh patient while up, catheter may be leaking as bed was wet when he got up.) PT Visit Diagnosis: Muscle weakness (generalized) (M62.81);Difficulty in walking, not elsewhere classified (R26.2)     Time: 1130-1150 PT Time Calculation (min) (ACUTE ONLY): 20 min  Charges:  $Gait Training: 8-22 mins                    Delron Comer, PT, GCS 07/11/21,12:14 PM

## 2021-07-11 NOTE — Progress Notes (Signed)
OT Cancellation Note  Patient Details Name: Miguel Flores MRN: [id number] DOB: Feb 16, 1941   Cancelled Treatment:    Reason Eval/Treat Not Completed: Fatigue/lethargy limiting ability to participate;Patient at procedure or test/ unavailable  OT attempted X's 2 today. Pt.'s pastor arrived this am during first visit attempt. Upon the 2nd visit attempt, Pt.'s son reports that the patient is sleeping soundly. Will continue to monitor, and reattempt OT treatment at a later time or date.  Harrel Carina MS, OTR/L 07/11/2021, 1:41 PM

## 2021-07-11 NOTE — TOC Progression Note (Signed)
Transition of Care [facility]) - Progression Note    Patient Details  Name: Miguel Flores MRN: [id number] Date of Birth: 08-Mar-1941  [facility] CM/SW Contact  Beverly Sessions, RN Phone Number: 07/11/2021, 1:05 PM  Clinical Narrative:    Per oncology and MD plan would be to send patient home with private 24-hour home care services and hopefully home hospice without TPN sometime next week..  Palliative to follow up today to discuss hospice services   If determined home with hospice [facility] will follow up on hospice preference    Expected Discharge Plan:  (TBD) Barriers to Discharge: Continued Medical Work up  Expected Discharge Plan and Services Expected Discharge Plan:  (TBD)     Post Acute Care Choice:  (TBD) Living arrangements for the past 2 months: Single Family Home                                       Social Determinants of Health (SDOH) Interventions    Readmission Risk Interventions No flowsheet data found.

## 2021-07-11 NOTE — Consult Note (Signed)
PHARMACY - TOTAL PARENTERAL NUTRITION CONSULT NOTE   Indication: Fistula  Patient Measurements: Height: 5' 5.98" (167.6 cm) Weight: 68.2 kg (150 lb 5.7 oz) IBW/kg (Calculated) : 63.76 TPN AdjBW (KG): 74.4 Body mass index is 24.28 kg/m.  Assessment:  Patient is an 80 y/o M with medical history including diabetes, HTN, colon cancer with lung metastases, recent history of admission at OSH for cholecystitis s/p cholecystectomy c/b post-op sepsis. Patient underwent ExLap with findings of small bowel injury which was repaired. 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 He is now presenting to the hospital for ileostomy leakage. Imaging notable for multiple intra-abdominal abscess with contrast extravasation to midline of abdominal wound. General surgery is following.   Pharmacy consulted to continue home TPN while patient is admitted.  Glucose / Insulin: History of controlled diabetes (last Hgb A1c 6.9%).    Novolog coverage 10 units q4hours + SSI q4 + glargine  Electrolytes: Hyponatremia (130), Potassium improved (5.2>4.5>4.7>4.2>4.6) Renal: SCr stable Hepatic: LFTs within normal limits Intake / Output; MIVF: -228 mL since admission  GI Imaging: 8/17 CT abdomen / pelvis: Large ventral wound containing small amount of contrast. Enterocutaneous fistula. Multiple intra-abdominal abscess(s). Mesenteric inflammation. Small bilateral pleural effusions with atelectasis or pneumonia at the bases. Metastatic lung nodules, slight increased size of right lower lobe metastatic nodule  GI Surgeries / Procedures:  History of recent cholecystectomy, ExLap at OSH in 05/2021  Central access: Port (see note above) TPN start date: 8/18   Outpatient TPN information: [facility] in [place], [place] Ph: [phone]  Nutritional Goals: Goal TPN rate was 83 mL/hr (provides 105.6 g of protein and 2262 kcals per day) - now  weaning  RD Assessment: Estimated Needs Total Energy Estimated Needs: 2000-2300kcal/day Total Protein Estimated Needs: 100-115g/day Total Fluid Estimated Needs: 2.0-2.3L/day  Current Nutrition:  TPN dysphagia diet  Plan:  Continue TPN at 42 mL/hr (total volume w/ overfill 1108 mL)  48h calorie count: meeting about 50% of his estimated needs via oral intake (plan: maximize oral nutrition so that TPN can be discontinued and then SNF placement can occur) Nutritional components: Amino acids (Travasol 10%): 53.4 grams Lipids (20% SMOF): 34.8 grams Dextrose: 171.36 grams kCal: 1143 / 24h Electrolytes in TPN: Na 75 mEq/L (increased from 60), KCl 15 mEq/L, Ca 5 mEq/L, Mg 73mEq/L, and Phos 87mmol/L. Cl:Ac 1:1 Add standard MVI and trace elements to TPN Continue Symglee 12 u BID and Novolog to 10 units q4 hr + SSI - will monitor carefully, and hold when needed Monitor TPN labs on Mon/Thurs, daily until stable   Dallie Piles, PharmD Clinical Pharmacist 07/11/2021 7:05 AM

## 2021-07-11 NOTE — Progress Notes (Signed)
Mobility Specialist - Progress Note   07/11/21 1500  Mobility  Activity Transferred:  Chair to bed  Level of Assistance Minimal assist, patient does 75% or more  Assistive Device None  Distance Ambulated (ft) 3 ft  Mobility Response Tolerated well  Mobility performed by Mobility specialist  $Mobility charge 1 Mobility    Pt transferred chair-bed. Alarm set.   Kathee Delton Mobility Specialist 07/11/21, 3:48 PM

## 2021-07-12 LAB — GLUCOSE, CAPILLARY
Glucose-Capillary: 177 mg/dL — ABNORMAL HIGH (ref 70–99)
Glucose-Capillary: 162 mg/dL — ABNORMAL HIGH (ref 70–99)
Glucose-Capillary: 78 mg/dL (ref 70–99)
Glucose-Capillary: 247 mg/dL — ABNORMAL HIGH (ref 70–99)
Glucose-Capillary: 181 mg/dL — ABNORMAL HIGH (ref 70–99)
Glucose-Capillary: 232 mg/dL — ABNORMAL HIGH (ref 70–99)

## 2021-07-12 LAB — MAGNESIUM: Magnesium: 2.1 mg/dL (ref 1.7–2.4)

## 2021-07-12 LAB — BASIC METABOLIC PANEL
Anion gap: 3 — ABNORMAL LOW (ref 5–15)
BUN: 44 mg/dL — ABNORMAL HIGH (ref 8–23)
CO2: 22 mmol/L (ref 22–32)
Calcium: 8.6 mg/dL — ABNORMAL LOW (ref 8.9–10.3)
Chloride: 104 mmol/L (ref 98–111)
Creatinine, Ser: 0.73 mg/dL (ref 0.61–1.24)
GFR, Estimated: >60 mL/min (ref >60)
Glucose, Bld: 72 mg/dL (ref 70–99)
Potassium: 3.7 mmol/L (ref 3.5–5.1)
Sodium: 129 mmol/L — ABNORMAL LOW (ref 135–145)

## 2021-07-12 LAB — PHOSPHORUS: Phosphorus: 3.3 mg/dL (ref 2.5–4.6)

## 2021-07-12 MED ORDER — SALINE SPRAY 0.65 % NA SOLN
1.0000 | NASAL | Status: DC | PRN
  Filled 2021-07-12: qty 44

## 2021-07-12 MED ORDER — TRAVASOL 10 % IV SOLN
INTRAVENOUS | Status: AC
  Filled 2021-07-12: qty 534.2

## 2021-07-12 NOTE — Progress Notes (Signed)
PT Cancellation Note  Patient Details Name: Miguel Flores MRN: [id number] DOB: 1941/04/23   Cancelled Treatment:    Reason Eval/Treat Not Completed: Patient declined, no reason specified. Upon entry to room, pt confused and lethargic. Oriented to self only. Able to follow single step commands. Attempts made to work with pt but pleasantly declining at this time. Will re-attempt at a later date.   Salem Caster. Fairly IV, PT, DPT Physical Therapist- [facility]  07/12/2021, 1:30 PM

## 2021-07-12 NOTE — Progress Notes (Signed)
patient pulled midline to where catheter was pulled out at insertion site and bent.  Midline would not flush.  Midline removed.  iv team order placed d/t patient getting antibiotics and port is dedicated to TPN. MD oncall notified

## 2021-07-12 NOTE — Progress Notes (Signed)
Ostomy changed due to leakage at bottom of appliance.  Ostomy appliance placed per order.

## 2021-07-12 NOTE — Progress Notes (Signed)
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  He did had evidence of metastatic lung disease but is was able to be controlled with chemotherapy.  He presented to [facility] 6 weeks ago for acute cholecystitis and underwent laparoscopic cholecystectomy on May 16, 2021.  His hospitalization was complicated by missed bowel injury requiring exploratory laparotomy and multiple interventions.  Eventually the patient developed enterocutaneous fistula and was in the hospital for over 4 weeks.  Now he presents with issues regarding enterocutaneous fistula and the Eakin pouch leaking. I personally reviewed the CT scan and there is evidence of a small collections that given his primary pathology, general surgery will not attempt any percutaneous drainage.  This may lead to more complications given his EC fistula and friable bowel.   Patient admitted 06/26/2021 for problems with fistula and bag catching the stool.  Wound care has been able to get a bag that is doing the symptoms and catching his stool and only leaking intermittently.  The patient does have a cough and has a little interstitial edema and bilateral pleural effusions and that improved a little bit with IV Lasix and IV albumin.  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 Janese Flores as outpatient.  Disposition has become very challenging.  The family is unable to provide the care that they feel the patient needs for home.  TPN is the major barrier to finding a accepting facility.  Per TOC she is reached out to 40+ facilities without an accepting one with the majority claiming that TPN is the barrier to admission.  I discussed this at length with patient and patient's son Miguel Flores on phone.  Current plan of  care is to attempt to wean off of TPN.  If we are able to do so and patient can meet the majority of his nutritional needs we can get him to a skilled nursing facility and he can seek surgical follow-up at tertiary care center.  As of 8/26 patient is starting to eat more.  We are tapering TPN.  Current rate 83 mL/h.  Taper down to 42 mL/h  8/27: Patient became altered and delirious last night.  Hospital-acquired delirium versus urinary tract infection.  Unfortunately patient self discontinued his PICC line and pulled his ileostomy.  Ileostomy is in place and functioning appropriately.  After discussion with the PICC team and the IV nurse and the bedside RN decision was made to heparinize and access patient's implanted port and administer TPN via this route.  This will avoid placement of another PICC line and will allow [place] to continue parenteral nutrition.  8/29: Patient meeting about 50 to 60% of his nutritional goals.  Needs to be 90% or greater in order for [place] to safely discontinue TPN.   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[place]) Active Problems:   Essential hypertension   Type 2 diabetes mellitus with hyperlipidemia (HCC)   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HCC)   Lung metastases (HCC)   Post cholecystectomy intraabdominal abscess   S/P cholecystectomy   On total parenteral nutrition (TPN)   Malnutrition of moderate degree   Overweight (BMI 25.0-29.9)   Enterocutaneous fistula   Anemia of chronic disease   Hyponatremia   Cough   Pleural effusion   Acute  metabolic encephalopathy   Palliative care encounter  Enterocutaneous fistula Multiple fluid collections on CT abdomen Extensive and complex surgical history Majority of care at outside facility in [facility] surgery consulted here They will facilitate outpatient evaluation and referral to tertiary care facility however will be 6 months before any intervention Currently no recommendations of  intervention on the abdomen Plan: Ostomy care per nursing and WOC  Moderate protein calorie malnutrition Currently on TPN.  Patient doing better with oral intake TPN weaned to half rate as of 8/26 Unfortunately patient pulled out PICC line on 8/26 Port accessed and TPN to be administered via this route Oral intake has been improving but still not meeting nutritional goals Plan: Calorie count completed Patient still not meeting his nutritional goals Continue maximizing oral intake Attempt to wean off TPN entirely within the next few days Instructions given to nursing staff.   Every single staff member that walks into the patient's room during the course of the day needs to encourage him to eat  Urinary tract infection Repeat urinalysis on 8/26 nitrite positive Unclear whether this represents true infection but will treat as such given the encephalopathy Urine culture with greater than 100,000 colony-forming units gram negative rods Resistant to Rocephin Plan: Continue IV Rocephin Plan for 7-day antibiotic course, (8/29--9/4) Monitor vitals and fever curve  Acute metabolic encephalopathy Baseline is unclear however concern the patient was confused.   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 Janese Flores as outpatient No active oncology issues however palliative care nurse practitioner Miguel Flores was consulted for goals of care At this time patient and family not interested in de-escalation of care Outpatient follow-up  Hyponatremia Likely secondary to malnutrition and poor p.o. 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  At this point unclear to me whether patient would be safe at home environment.  I spoke at length to his son [name] and the patient lives alone and prior to his hospitalization at [facility] was apparently robust and fully independent.  There is been a significant decline in his functional status and at this point0 I am not sure that home environment would be safe disposition and would likely result in readmission.  At this point TPN is the major barrier to skilled nursing facility placement so we will attempt to wean from TPN if the patient is able to meet some of his nutritional goals.  Patient has been trying to eat more and has been receptive to encouragement from nursing staff  8/31 as per discussion with oncologist, patient's son is open for hospice care at home now and he is working to arrange 24/7 care at home.  In the meantime we will continue TPN as an inpatient, plan is to go home without TPN when 24/7 care will be arranged and hospice will be consulted as well.   DVT prophylaxis: SQ Lovenox Code Status: Full Family Communication: [name] [phone] on 8/24, 8/25, 8/27, 8/29.  Left VM on  8/30 Disposition Plan: Status is: Inpatient  Remains inpatient appropriate because:Inpatient level of care appropriate due to severity of illness  Dispo: The patient is from: Home              Anticipated d/c is to: Home with Hospice care and 24/7 arrangement private pay              Patient currently is not medically stable to DC, needs 24/7 home care arrangement    Difficult to place patient No  Patient is not medically stable for discharge.  His nutritional status is poor and we are attempting to wean from TPN in order to secure a safe disposition plan to  skilled nursing facility.  At this point in my medical opinion I feel that disposition home is not safe.  He has a very complex surgical abdomen and an ostomy that frequently leaks and requires specific attention and care.   Level of care: Med-Surg  Consultants:  General surgery Oncologist  Procedures:  None  Antimicrobials:  None   Subjective: No significant overnight issues, patient was resting comfortably, denied any abdominal pain, no shortness of breath, no chest pain or palpitations.  Patient still has decreased appetite and does not feel like eating but he is trying.   Objective: Vitals:   07/11/21 0745 07/11/21 1538 07/11/21 2005 07/12/21 0751  BP: (!) 128/59 (!) 104/58 (!) 122/56 (!) 124/56  Pulse: 75 85 78 72  Resp:   16 16  Temp: 97.7 F (36.5 C) 97.7 F (36.5 C) 98.6 F (37 C) (!) 97.5 F (36.4 C)  TempSrc:   Oral Oral  SpO2: 99% 100% 98% 100%  Weight:      Height:        Intake/Output Summary (Last 24 hours) at 07/12/2021 1759 Last data filed at 07/12/2021 1737 Gross per 24 hour  Intake 1820.58 ml  Output 2900 ml  Net -1079.42 ml   Filed Weights   07/09/21 0624 07/10/21 0500 07/11/21 0436  Weight: 66.1 kg 66.8 kg 68.2 kg    Examination:  General exam: Awake.  No visible distress Respiratory system: Lung sounds decreased at bases.  Normal work of breathing.  Room air Cardiovascular system: S1-S2, regular rate and rhythm, no murmurs, no pedal edema  gastrointestinal system: Thin, nontender, nondistended, normal bowel sounds Central nervous system: Awake and alert, no focal deficits Extremities: Symmetrically decreased power bilaterally upper and lower extremities Skin: Pale with scattered excoriations Psychiatry: Judgement and insight appear impaired. Mood & affect flattened.     Data Reviewed: I have personally reviewed following labs and imaging studies  CBC: Recent Labs  Lab 07/06/21 1209 07/06/21 1815 07/06/21 2359 07/07/21 0601  07/11/21 0807  WBC  --   --   --   --  14.6*  NEUTROABS  --   --   --   --  10.2*  HGB 10.9* 10.7* 10.4* 11.6* 10.8*  HCT 36.3* 35.8* 34.0* 37.7* 33.7*  MCV  --   --   --   --  84.5  PLT  --   --   --   --  865   Basic Metabolic Panel: Recent Labs  Lab 07/06/21 0459 07/07/21 0601 07/08/21 0514 07/11/21 0539 07/12/21 0804  NA 136 136 130* 128* 129*  K 4.7 4.2 4.6 4.1 3.7  CL 110 107 103 102 104  CO2 20* 20* 19* 20* 22  GLUCOSE 133* 103* 251* 209* 72  BUN 86* 76* 74* 45* 44*  CREATININE  0.97 1.00 1.11 0.78 0.73  CALCIUM 10.0 9.7 9.5 8.7* 8.6*  MG  --   --  2.2 1.9 2.1  PHOS  --   --  4.5 3.3 3.3   GFR: Estimated Creatinine Clearance: 65.4 mL/min (by C-G formula based on SCr of 0.73 mg/dL). Liver Function Tests: Recent Labs  Lab 07/08/21 0514 07/11/21 0539  AST 28 37  ALT 51* 53*  ALKPHOS 168* 147*  BILITOT 0.5 0.4  PROT 7.9 6.6  ALBUMIN 3.3* 2.8*   No results for input(s): LIPASE, AMYLASE in the last 168 hours. No results for input(s): AMMONIA in the last 168 hours. Coagulation Profile: No results for input(s): INR, PROTIME in the last 168 hours. Cardiac Enzymes: No results for input(s): CKTOTAL, CKMB, CKMBINDEX, TROPONINI in the last 168 hours. BNP (last 3 results) No results for input(s): PROBNP in the last 8760 hours. HbA1C: No results for input(s): HGBA1C in the last 72 hours. CBG: Recent Labs  Lab 07/12/21 0126 07/12/21 0449 07/12/21 0747 07/12/21 1155 07/12/21 1618  GLUCAP 177* 162* 78 247* 181*   Lipid Profile: No results for input(s): CHOL, HDL, LDLCALC, TRIG, CHOLHDL, LDLDIRECT in the last 72 hours.   Thyroid Function Tests: No results for input(s): TSH, T4TOTAL, FREET4, T3FREE, THYROIDAB in the last 72 hours. Anemia Panel: No results for input(s): VITAMINB12, FOLATE, FERRITIN, TIBC, IRON, RETICCTPCT in the last 72 hours. Sepsis Labs: No results for input(s): PROCALCITON, LATICACIDVEN in the last 168 hours.   Recent Results (from the  past 240 hour(s))  Urine Culture     Status: Abnormal   Collection Time: 07/05/21  4:25 PM   Specimen: Urine, Random  Result Value Ref Range Status   Specimen Description   Final    URINE, RANDOM Performed at [facility], [address], [place], [place] [address]    Special Requests   Final    NONE Performed at [facility], [place], [place] [address]    Culture >=100,000 COLONIES/mL ESCHERICHIA COLI (A)  Final   Report Status 07/08/2021 FINAL  Final   Organism ID, Bacteria ESCHERICHIA COLI (A)  Final      Susceptibility   Escherichia coli - MIC*    AMPICILLIN >=32 RESISTANT Resistant     CEFAZOLIN >=64 RESISTANT Resistant     CEFEPIME <=0.12 SENSITIVE Sensitive     CEFTRIAXONE 32 RESISTANT Resistant     CIPROFLOXACIN 0.5 SENSITIVE Sensitive     GENTAMICIN <=1 SENSITIVE Sensitive     IMIPENEM <=0.25 SENSITIVE Sensitive     NITROFURANTOIN <=16 SENSITIVE Sensitive     TRIMETH/SULFA <=20 SENSITIVE Sensitive     AMPICILLIN/SULBACTAM 16 INTERMEDIATE Intermediate     PIP/TAZO <=4 SENSITIVE Sensitive     * >=100,000 COLONIES/mL ESCHERICHIA COLI         Radiology Studies: No results found.      Scheduled Meds:  aspirin EC  81 mg Oral Daily   benzonatate  200 mg Oral TID   Chlorhexidine Gluconate Cloth  6 each Topical Daily   dexamethasone  4 mg Oral Daily   dronabinol  5 mg Oral BID AC   feeding supplement  237 mL Oral TID BM   furosemide  20 mg Oral Daily   insulin aspart  0-15 Units Subcutaneous Q4H   insulin aspart  10 Units Subcutaneous Q4H   insulin glargine-yfgn  12 Units Subcutaneous BID   loratadine  10 mg Oral Daily   losartan  25 mg Oral Daily  mirtazapine  15 mg Oral QHS   pantoprazole  40 mg Oral Daily   pregabalin  75 mg Oral BID   sodium chloride flush  10-40 mL Intracatheter Q12H   Continuous Infusions:  sodium chloride Stopped (07/12/21 1614)   ciprofloxacin Stopped (07/12/21 1102)   TPN ADULT ([place])        LOS: 16 days    Time spent: 25 minutes    Val Riles, MD [facility] Pager [phone]  If 7PM-7AM, please contact night-coverage 07/12/2021, 5:59 PM

## 2021-07-12 NOTE — Consult Note (Addendum)
PHARMACY - TOTAL PARENTERAL NUTRITION CONSULT NOTE   Indication: Fistula  Patient Measurements: Height: 5' 5.98" (167.6 cm) Weight: 68.2 kg (150 lb 5.7 oz) IBW/kg (Calculated) : 63.76 TPN AdjBW (KG): 74.4 Body mass index is 24.28 kg/m.  Assessment:  Patient is an 80 y/o M with medical history including diabetes, HTN, colon cancer with lung metastases, recent history of admission at OSH for cholecystitis s/p cholecystectomy c/b post-op sepsis. Patient underwent ExLap with findings of small bowel injury which was repaired. 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 He is now presenting to the hospital for ileostomy leakage. Imaging notable for multiple intra-abdominal abscess with contrast extravasation to midline of abdominal wound. General surgery is following.   Pharmacy consulted to continue home TPN while patient is admitted.  Glucose / Insulin: History of controlled diabetes (last Hgb A1c 6.9%).    Novolog coverage 10 units q4hours + SSI q4 +  12 units BID glargine  Electrolytes: Hyponatremia (129), Potassium 4.1>3.7  (pt on lasix 20mg  po daily) Renal: SCr stable 0.73 Hepatic: AST 37/ALT 53 Intake / Output; MIVF:   GI Imaging: 8/17 CT abdomen / pelvis: Large ventral wound containing small amount of contrast. Enterocutaneous fistula. Multiple intra-abdominal abscess(s). Mesenteric inflammation. Small bilateral pleural effusions with atelectasis or pneumonia at the bases. Metastatic lung nodules, slight increased size of right lower lobe metastatic nodule  GI Surgeries / Procedures:  History of recent cholecystectomy, ExLap at OSH in 05/2021  Central access: Port (see note above) TPN start date: 8/18   Outpatient TPN information: [facility] in [place], [place] Ph: [phone]  Nutritional Goals: Goal TPN rate was 83 mL/hr (provides 105.6 g of protein and 2262 kcals per day) - now weaning  RD  Assessment: Estimated Needs Total Energy Estimated Needs: 2000-2300kcal/day Total Protein Estimated Needs: 100-115g/day Total Fluid Estimated Needs: 2.0-2.3L/day  Current Nutrition:  TPN dysphagia diet, Ensure TID  Plan:  Continue TPN at 42 mL/hr (total volume w/ overfill 1108 mL)  48h calorie count: meeting about 50% of his estimated needs via oral intake (plan: maximize oral nutrition so that TPN can be discontinued and then SNF placement can occur) Nutritional components: Amino acids (Travasol 10%): 53.4 grams Lipids (20% SMOF): 34.8 grams Dextrose: 171.36 grams kCal: 1143 / 24h Electrolytes in TPN: Na 80 mEq/L (increased from 75 to 80 meq/L on 9/2), KCl 15 mEq/L, Ca 5 mEq/L, Mg 73mEq/L, and Phos 32mmol/L. Cl:Ac 1:1 Add standard MVI and trace elements to TPN Patient on dexamethasone daily. Continue Symglee(insulin glargine) 12 units BID and Novolog to 10 units q4 hr + SSI moderate - will monitor carefully, and hold when needed. (May need to decrease PM glargine and increase AM dose-watch) Monitor TPN labs on Mon/Thurs, daily until stable   Noralee Space, PharmD Clinical Pharmacist 07/12/2021 9:58 AM

## 2021-07-13 LAB — BASIC METABOLIC PANEL
Anion gap: 3 — ABNORMAL LOW (ref 5–15)
BUN: 51 mg/dL — ABNORMAL HIGH (ref 8–23)
CO2: 21 mmol/L — ABNORMAL LOW (ref 22–32)
Calcium: 8.7 mg/dL — ABNORMAL LOW (ref 8.9–10.3)
Chloride: 107 mmol/L (ref 98–111)
Creatinine, Ser: 0.82 mg/dL (ref 0.61–1.24)
GFR, Estimated: >60 mL/min (ref >60)
Glucose, Bld: 88 mg/dL (ref 70–99)
Potassium: 4.3 mmol/L (ref 3.5–5.1)
Sodium: 131 mmol/L — ABNORMAL LOW (ref 135–145)

## 2021-07-13 LAB — CBC
HCT: 32.4 % — ABNORMAL LOW (ref 39.0–52.0)
Hemoglobin: 10.3 g/dL — ABNORMAL LOW (ref 13.0–17.0)
MCH: 26.3 pg (ref 26.0–34.0)
MCHC: 31.8 g/dL (ref 30.0–36.0)
MCV: 82.7 fL (ref 80.0–100.0)
Platelets: 368 10*3/uL (ref 150–400)
RBC: 3.92 MIL/uL — ABNORMAL LOW (ref 4.22–5.81)
RDW: 17.7 % — ABNORMAL HIGH (ref 11.5–15.5)
WBC: 22.1 10*3/uL — ABNORMAL HIGH (ref 4.0–10.5)
nRBC: 0 % (ref 0.0–0.2)

## 2021-07-13 LAB — GLUCOSE, CAPILLARY
Glucose-Capillary: 101 mg/dL — ABNORMAL HIGH (ref 70–99)
Glucose-Capillary: 114 mg/dL — ABNORMAL HIGH (ref 70–99)
Glucose-Capillary: 167 mg/dL — ABNORMAL HIGH (ref 70–99)
Glucose-Capillary: 184 mg/dL — ABNORMAL HIGH (ref 70–99)
Glucose-Capillary: 244 mg/dL — ABNORMAL HIGH (ref 70–99)
Glucose-Capillary: 316 mg/dL — ABNORMAL HIGH (ref 70–99)

## 2021-07-13 LAB — PHOSPHORUS: Phosphorus: 3.7 mg/dL (ref 2.5–4.6)

## 2021-07-13 LAB — MAGNESIUM: Magnesium: 2.2 mg/dL (ref 1.7–2.4)

## 2021-07-13 MED ORDER — METRONIDAZOLE 500 MG/100ML IV SOLN
500.0000 mg | Freq: Three times a day (TID) | INTRAVENOUS | Status: AC
  Administered 2021-07-13 – 2021-07-15 (×7): 500 mg via INTRAVENOUS
  Filled 2021-07-13 (×11): qty 100

## 2021-07-13 MED ORDER — DEXTROSE 10 % IV SOLN: INTRAVENOUS | Status: DC

## 2021-07-13 MED ORDER — TRAVASOL 10 % IV SOLN
INTRAVENOUS | Status: AC
  Filled 2021-07-13: qty 534.2

## 2021-07-13 NOTE — TOC Progression Note (Addendum)
Transition of Care [facility]) - Progression Note    Patient Details  Name: Miguel Flores MRN: [id number] Date of Birth: 29-Nov-1940  Transition of [facility]) CM/SW [place], LCSW Phone Number: 07/13/2021, 1:16 PM  Clinical Narrative:   Call to sons [name] and [name] to confirm DC plans. Both sons confirm they are still working on setting up 24 hour care in the home, and expect this will be in place by the end of next week. They both stated the family does not want hospice or home health services. They denied TOC needs at this time. Updated MD.     Expected Discharge Plan:  (TBD) Barriers to Discharge: Continued Medical Work up  Expected Discharge Plan and Services Expected Discharge Plan:  (TBD)     Post Acute Care Choice:  (TBD) Living arrangements for the past 2 months: Single Family Home                                       Social Determinants of Health (SDOH) Interventions    Readmission Risk Interventions No flowsheet data found.

## 2021-07-13 NOTE — Progress Notes (Signed)
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  He did had evidence of metastatic lung disease but is was able to be controlled with chemotherapy.  He presented to [facility] 6 weeks ago for acute cholecystitis and underwent laparoscopic cholecystectomy on May 16, 2021.  His hospitalization was complicated by missed bowel injury requiring exploratory laparotomy and multiple interventions.  Eventually the patient developed enterocutaneous fistula and was in the hospital for over 4 weeks.  Now he presents with issues regarding enterocutaneous fistula and the [place]akin pouch leaking. I personally reviewed the CT scan and there is evidence of a small collections that given his primary pathology, general surgery will not attempt any percutaneous drainage.  This may lead to more complications given his EC fistula and friable bowel.   Patient admitted 06/26/2021 for problems with fistula and bag catching the stool.  Wound care has been able to get a bag that is doing the symptoms and catching his stool and only leaking intermittently.  The patient does have a cough and has a little interstitial edema and bilateral pleural effusions and that improved a little bit with IV Lasix and IV albumin.  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 [name] as outpatient.  Disposition has become very challenging.  The family is unable to provide the care that they feel the patient needs for home.  TPN is the major barrier to finding a accepting facility.  Per TOC she is reached out to 40+ facilities without an accepting one with the majority claiming that TPN is the barrier to admission.  I discussed this at length with patient and patient's son Miguel Flores on phone.  Current plan of  care is to attempt to wean off of TPN.  If we are able to do so and patient can meet the majority of his nutritional needs we can get him to a skilled nursing facility and he can seek surgical follow-up at tertiary care center.  As of 8/26 patient is starting to eat more.  We are tapering TPN.  Current rate 83 mL/h.  Taper down to 42 mL/h  8/27: Patient became altered and delirious last night.  Hospital-acquired delirium versus urinary tract infection.  Unfortunately patient self discontinued his PICC line and pulled his ileostomy.  Ileostomy is in place and functioning appropriately.  After discussion with the PICC team and the IV nurse and the bedside RN decision was made to heparinize and access patient's implanted port and administer TPN via this route.  This will avoid placement of another PICC line and will allow [place] to continue parenteral nutrition.  8/29: Patient meeting about 50 to 60% of his nutritional goals.  Needs to be 90% or greater in order for [place] to safely discontinue TPN.   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[place]) Active Problems:   Essential hypertension   Type 2 diabetes mellitus with hyperlipidemia (HCC)   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HCC)   Lung metastases (HCC)   Post cholecystectomy intraabdominal abscess   S/P cholecystectomy   On total parenteral nutrition (TPN)   Malnutrition of moderate degree   Overweight (BMI 25.0-29.9)   Enterocutaneous fistula   Anemia of chronic disease   Hyponatremia   Cough   Pleural effusion   Acute  metabolic encephalopathy   Palliative care encounter  Enterocutaneous fistula Multiple fluid collections on CT abdomen Extensive and complex surgical history Majority of care at outside facility in [facility] surgery consulted here They will facilitate outpatient evaluation and referral to tertiary care facility however will be 6 months before any intervention Currently no recommendations of  intervention on the abdomen Plan: Ostomy care per nursing and [place] 9/3 WBC count elevated could be secondary to abdominal abscess Patient was on Cipro, added Flagyl as well As patient is full code so we will continue to treat as per family request.  Moderate protein calorie malnutrition Currently on TPN.  Patient doing better with oral intake TPN weaned to half rate as of 8/26 Unfortunately patient pulled out PICC line on 8/26 Port accessed and TPN to be administered via this route Oral intake has been improving but still not meeting nutritional goals Plan: Calorie count completed Patient still not meeting his nutritional goals Continue maximizing oral intake Attempt to wean off TPN entirely within the next few days Instructions given to nursing staff.   Every single staff member that walks into the patient's room during the course of the day needs to encourage him to eat   Urinary tract infection Repeat urinalysis on 8/26 nitrite positive Unclear whether this represents true infection but will treat as such given the encephalopathy Urine culture with greater than 100,000 colony-forming units gram negative rods Resistant to Rocephin Plan: Continue IV Rocephin Plan for 7-day antibiotic course, (8/29--9/4) Monitor vitals and fever curve  Acute metabolic encephalopathy Baseline is unclear however concern the patient was confused.   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 [name] as outpatient No active oncology issues however palliative care nurse practitioner [name] was consulted for goals of care At this time patient and family not interested in de-escalation of care Outpatient follow-up  Hyponatremia Likely  secondary to malnutrition and poor p.o. 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  At this point unclear to me whether patient would be safe at home environment.  I spoke at length to his son [name] and the patient lives alone and prior to his hospitalization at [facility] was apparently robust and fully independent.  There is been a significant decline in his functional status and at this point0 I am not sure that home environment would be safe disposition and would likely result in readmission.  At this point TPN is the major barrier to skilled nursing facility placement so we will attempt to wean from TPN if the patient is able to meet some of his nutritional goals.  Patient has been trying to eat more and has been receptive to encouragement from nursing staff  8/31 as per discussion with oncologist, patient's son is open for hospice care at home now and he is working to arrange 24/7 care at home.  In the meantime we will continue TPN as an inpatient, plan is to go home without TPN when 24/7  care will be arranged and hospice will be consulted as well.   DVT prophylaxis: SQ Lovenox Code Status: Full Family Communication: [name] [phone] on 8/24, 8/25, 8/27, 8/29.  Left VM on 8/30,  Left VM on 9/3 Disposition Plan: Status is: Inpatient  Remains inpatient appropriate because:Inpatient level of care appropriate due to severity of illness  Dispo: The patient is from: Home              Anticipated d/c is to: Home with Hospice care and 24/7 arrangement private pay              Patient currently is not medically stable to DC, needs 24/7 home care arrangement    Difficult to  place patient No  Patient is not medically stable for discharge.  His nutritional status is poor and we are attempting to wean from TPN in order to secure a safe disposition plan to skilled nursing facility.  At this point in my medical opinion I feel that disposition home is not safe.  He has a very complex surgical abdomen and an ostomy that frequently leaks and requires specific attention and care.   Level of care: Med-Surg  Consultants:  General surgery Oncologist  Procedures:  None  Antimicrobials:  None   Subjective: No significant overnight issues, patient was resting comfortably, denied any abdominal pain, no shortness of breath, no chest pain or palpitations.  Patient still has decreased appetite  Patient's family was at bedside, discussed management plan.  Left voicemail for patient's son [name] if he has any questions for me  Objective: Vitals:   07/12/21 0751 07/12/21 2026 07/13/21 0427 07/13/21 0751  BP: (!) 124/56 (!) 129/56 (!) 115/59 (!) 143/59  Pulse: 72 88 79 87  Resp: 16 16 16 18   Temp: (!) 97.5 F (36.4 C) 97.8 F (36.6 C) 97.8 F (36.6 C) (!) 97.5 F (36.4 C)  TempSrc: Oral Axillary Axillary Axillary  SpO2: 100% 100% 100% 100%  Weight:   67.2 kg   Height:        Intake/Output Summary (Last 24 hours) at 07/13/2021 1429 Last data filed at 07/13/2021 0733 Gross per 24 hour  Intake 1283.58 ml  Output 700 ml  Net 583.58 ml   Filed Weights   07/10/21 0500 07/11/21 0436 07/13/21 0427  Weight: 66.8 kg 68.2 kg 67.2 kg    Examination:  General exam: Awake.  No visible distress Respiratory system: Lung sounds decreased at bases.  Normal work of breathing.  Room air Cardiovascular system: S1-S2, regular rate and rhythm, no murmurs, no pedal edema  gastrointestinal system: Thin, nontender, nondistended, normal bowel sounds Central nervous system: Awake and alert, no focal deficits Extremities: Symmetrically decreased power bilaterally upper and lower  extremities Skin: Pale with scattered excoriations Psychiatry: Judgement and insight appear impaired. Mood & affect flattened.     Data Reviewed: I have personally reviewed following labs and imaging studies  CBC: Recent Labs  Lab 07/06/21 1815 07/06/21 2359 07/07/21 0601 07/11/21 0807 07/13/21 0432  WBC  --   --   --  14.6* 22.1*  NEUTROABS  --   --   --  10.2*  --   HGB 10.7* 10.4* 11.6* 10.8* 10.3*  HCT 35.8* 34.0* 37.7* 33.7* 32.4*  MCV  --   --   --  84.5 82.7  PLT  --   --   --  387 546   Basic Metabolic Panel: Recent Labs  Lab 07/07/21 0601 07/08/21 0514 07/11/21 0539 07/12/21  0804 07/13/21 0432  NA 136 130* 128* 129* 131*  K 4.2 4.6 4.1 3.7 4.3  CL 107 103 102 104 107  CO2 20* 19* 20* 22 21*  GLUCOSE 103* 251* 209* 72 88  BUN 76* 74* 45* 44* 51*  CREATININE 1.00 1.11 0.78 0.73 0.82  CALCIUM 9.7 9.5 8.7* 8.6* 8.7*  MG  --  2.2 1.9 2.1 2.2  PHOS  --  4.5 3.3 3.3 3.7   GFR: Estimated Creatinine Clearance: 63.8 mL/min (by C-G formula based on SCr of 0.82 mg/dL). Liver Function Tests: Recent Labs  Lab 07/08/21 0514 07/11/21 0539  AST 28 37  ALT 51* 53*  ALKPHOS 168* 147*  BILITOT 0.5 0.4  PROT 7.9 6.6  ALBUMIN 3.3* 2.8*   No results for input(s): LIPASE, AMYLASE in the last 168 hours. No results for input(s): AMMONIA in the last 168 hours. Coagulation Profile: No results for input(s): INR, PROTIME in the last 168 hours. Cardiac Enzymes: No results for input(s): CKTOTAL, CKMB, CKMBINDEX, TROPONINI in the last 168 hours. BNP (last 3 results) No results for input(s): PROBNP in the last 8760 hours. HbA1C: No results for input(s): HGBA1C in the last 72 hours. CBG: Recent Labs  Lab 07/12/21 2230 07/13/21 0011 07/13/21 0420 07/13/21 0748 07/13/21 1139  GLUCAP 232* 244* 101* 167* 184*   Lipid Profile: No results for input(s): CHOL, HDL, LDLCALC, TRIG, CHOLHDL, LDLDIRECT in the last 72 hours.   Thyroid Function Tests: No results for input(s):  TSH, T4TOTAL, FREET4, T3FREE, THYROIDAB in the last 72 hours. Anemia Panel: No results for input(s): VITAMINB12, FOLATE, FERRITIN, TIBC, IRON, RETICCTPCT in the last 72 hours. Sepsis Labs: No results for input(s): PROCALCITON, LATICACIDVEN in the last 168 hours.   Recent Results (from the past 240 hour(s))  Urine Culture     Status: Abnormal   Collection Time: 07/05/21  4:25 PM   Specimen: Urine, Random  Result Value Ref Range Status   Specimen Description   Final    URINE, RANDOM Performed at [facility], [address] [place], [place] [address]    Special Requests   Final    NONE Performed at [facility], [place], [place] [address]    Culture >=100,000 COLONIES/mL ESCHERICHIA COLI (A)  Final   Report Status 07/08/2021 FINAL  Final   Organism ID, Bacteria ESCHERICHIA COLI (A)  Final      Susceptibility   Escherichia coli - MIC*    AMPICILLIN >=32 RESISTANT Resistant     CEFAZOLIN >=64 RESISTANT Resistant     CEFEPIME <=0.12 SENSITIVE Sensitive     CEFTRIAXONE 32 RESISTANT Resistant     CIPROFLOXACIN 0.5 SENSITIVE Sensitive     GENTAMICIN <=1 SENSITIVE Sensitive     IMIPENEM <=0.25 SENSITIVE Sensitive     NITROFURANTOIN <=16 SENSITIVE Sensitive     TRIMETH/SULFA <=20 SENSITIVE Sensitive     AMPICILLIN/SULBACTAM 16 INTERMEDIATE Intermediate     PIP/TAZO <=4 SENSITIVE Sensitive     * >=100,000 COLONIES/mL ESCHERICHIA COLI         Radiology Studies: No results found.      Scheduled Meds:  aspirin EC  81 mg Oral Daily   benzonatate  200 mg Oral TID   Chlorhexidine Gluconate Cloth  6 each Topical Daily   dexamethasone  4 mg Oral Daily   dronabinol  5 mg Oral BID AC   feeding supplement  237 mL Oral TID BM   furosemide  20 mg Oral Daily  insulin aspart  0-15 Units Subcutaneous Q4H   insulin aspart  10 Units Subcutaneous Q4H   insulin glargine-yfgn  12 Units Subcutaneous BID   loratadine  10 mg Oral Daily   losartan  25  mg Oral Daily   mirtazapine  15 mg Oral QHS   pantoprazole  40 mg Oral Daily   pregabalin  75 mg Oral BID   sodium chloride flush  10-40 mL Intracatheter Q12H   Continuous Infusions:  sodium chloride Stopped (07/12/21 1614)   ciprofloxacin 400 mg (07/13/21 0942)   metronidazole 500 mg (07/13/21 1214)   TPN ADULT (ION) 42 mL/hr at 07/12/21 1915   TPN ADULT (ION)       LOS: 17 days    Time spent: 25 minutes    Val Riles, MD [facility] Pager [phone]  If 7PM-7AM, please contact night-coverage 07/13/2021, 2:29 PM

## 2021-07-13 NOTE — Consult Note (Signed)
PHARMACY - TOTAL PARENTERAL NUTRITION CONSULT NOTE   Indication: Fistula  Patient Measurements: Height: 5' 5.98" (167.6 cm) Weight: 67.2 kg (148 lb 2.4 oz) IBW/kg (Calculated) : 63.76 TPN AdjBW (KG): 74.4 Body mass index is 23.92 kg/m.  Assessment:  Patient is an 80 y/o M with medical history including diabetes, HTN, colon cancer with lung metastases, recent history of admission at OSH for cholecystitis s/p cholecystectomy c/b post-op sepsis. Patient underwent ExLap with findings of small bowel injury which was repaired. 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 He is now presenting to the hospital for ileostomy leakage. Imaging notable for multiple intra-abdominal abscess with contrast extravasation to midline of abdominal wound. General surgery is following.   Pharmacy consulted to continue home TPN while patient is admitted.  Glucose / Insulin: History of controlled diabetes (last Hgb A1c 6.9%).    Novolog coverage 10 units q4hours + SSI q4 +  12 units BID glargine  Electrolytes: Hyponatremia (129), Potassium 4.1>3.7  (pt on lasix 20mg  po daily) Renal: SCr stable 0.73 Hepatic: AST 37/ALT 53 Intake / Output; MIVF:   GI Imaging: 8/17 CT abdomen / pelvis: Large ventral wound containing small amount of contrast. Enterocutaneous fistula. Multiple intra-abdominal abscess(s). Mesenteric inflammation. Small bilateral pleural effusions with atelectasis or pneumonia at the bases. Metastatic lung nodules, slight increased size of right lower lobe metastatic nodule  GI Surgeries / Procedures:  History of recent cholecystectomy, ExLap at OSH in 05/2021  Central access: Port (see note above) TPN start date: 8/18   Outpatient TPN information: [facility] in [place], [place] Ph: [phone]  Nutritional Goals: Goal TPN rate was 83 mL/hr (provides 105.6 g of protein and 2262 kcals per day) - now weaning  RD  Assessment: Estimated Needs Total Energy Estimated Needs: 2000-2300kcal/day Total Protein Estimated Needs: 100-115g/day Total Fluid Estimated Needs: 2.0-2.3L/day  Current Nutrition:  TPN dysphagia diet, Ensure TID  Plan:  Continue TPN at 42 mL/hr (total volume w/ overfill 1108 mL)  48h calorie count: meeting about 50% of his estimated needs via oral intake (plan: maximize oral nutrition so that TPN can be discontinued and then SNF placement can occur) Nutritional components: Amino acids (Travasol 10%): 53.4 grams Lipids (20% SMOF): 34.8 grams Dextrose: 171.36 grams kCal: 1143 / 24h Electrolytes in TPN: Na 80 mEq/L (increased from 75 to 80 meq/L on 9/2), KCl 15 mEq/L, Ca 5 mEq/L, Mg 64mEq/L, and Phos 51mmol/L. Cl:Ac 1:1 Add standard MVI and trace elements to TPN Patient on dexamethasone daily. Continue Symglee(insulin glargine) 12 units BID and Novolog to 10 units q4 hr + SSI moderate - will monitor carefully, and hold when needed Monitor TPN labs on Mon/Thurs, daily until stable   Dallie Piles, PharmD Clinical Pharmacist 07/13/2021 8:39 AM

## 2021-07-14 LAB — CBC
HCT: 32.5 % — ABNORMAL LOW (ref 39.0–52.0)
Hemoglobin: 10.5 g/dL — ABNORMAL LOW (ref 13.0–17.0)
MCH: 27.2 pg (ref 26.0–34.0)
MCHC: 32.3 g/dL (ref 30.0–36.0)
MCV: 84.2 fL (ref 80.0–100.0)
Platelets: 381 10*3/uL (ref 150–400)
RBC: 3.86 MIL/uL — ABNORMAL LOW (ref 4.22–5.81)
RDW: 17.7 % — ABNORMAL HIGH (ref 11.5–15.5)
WBC: 20.3 10*3/uL — ABNORMAL HIGH (ref 4.0–10.5)
nRBC: 0 % (ref 0.0–0.2)

## 2021-07-14 LAB — BASIC METABOLIC PANEL
Anion gap: 6 (ref 5–15)
BUN: 47 mg/dL — ABNORMAL HIGH (ref 8–23)
CO2: 18 mmol/L — ABNORMAL LOW (ref 22–32)
Calcium: 8.3 mg/dL — ABNORMAL LOW (ref 8.9–10.3)
Chloride: 104 mmol/L (ref 98–111)
Creatinine, Ser: 0.82 mg/dL (ref 0.61–1.24)
GFR, Estimated: >60 mL/min (ref >60)
Glucose, Bld: 189 mg/dL — ABNORMAL HIGH (ref 70–99)
Potassium: 3.9 mmol/L (ref 3.5–5.1)
Sodium: 128 mmol/L — ABNORMAL LOW (ref 135–145)

## 2021-07-14 LAB — GLUCOSE, CAPILLARY
Glucose-Capillary: 103 mg/dL — ABNORMAL HIGH (ref 70–99)
Glucose-Capillary: 164 mg/dL — ABNORMAL HIGH (ref 70–99)
Glucose-Capillary: 168 mg/dL — ABNORMAL HIGH (ref 70–99)
Glucose-Capillary: 242 mg/dL — ABNORMAL HIGH (ref 70–99)
Glucose-Capillary: 63 mg/dL — ABNORMAL LOW (ref 70–99)
Glucose-Capillary: 81 mg/dL (ref 70–99)
Glucose-Capillary: 84 mg/dL (ref 70–99)

## 2021-07-14 LAB — PHOSPHORUS: Phosphorus: 3.4 mg/dL (ref 2.5–4.6)

## 2021-07-14 LAB — MAGNESIUM: Magnesium: 2 mg/dL (ref 1.7–2.4)

## 2021-07-14 MED ORDER — DEXTROSE 10 % IV SOLN: INTRAVENOUS | Status: AC

## 2021-07-14 MED ORDER — TRAVASOL 10 % IV SOLN
INTRAVENOUS | Status: AC
  Filled 2021-07-14: qty 534.2

## 2021-07-14 NOTE — Progress Notes (Signed)
PT BS was 63. Patient drank 8oz of orange juice and 15 mins later sugar is at 81. VS stable.

## 2021-07-14 NOTE — Progress Notes (Signed)
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  He did had evidence of metastatic lung disease but is was able to be controlled with chemotherapy.  He presented to [facility] 6 weeks ago for acute cholecystitis and underwent laparoscopic cholecystectomy on May 16, 2021.  His hospitalization was complicated by missed bowel injury requiring exploratory laparotomy and multiple interventions.  Eventually the patient developed enterocutaneous fistula and was in the hospital for over 4 weeks.  Now he presents with issues regarding enterocutaneous fistula and the Eakin pouch leaking. I personally reviewed the CT scan and there is evidence of a small collections that given his primary pathology, general surgery will not attempt any percutaneous drainage.  This may lead to more complications given his EC fistula and friable bowel.   Patient admitted 06/26/2021 for problems with fistula and bag catching the stool.  Wound care has been able to get a bag that is doing the symptoms and catching his stool and only leaking intermittently.  The patient does have a cough and has a little interstitial edema and bilateral pleural effusions and that improved a little bit with IV Lasix and IV albumin.  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 [name] as outpatient.  Disposition has become very challenging.  The family is unable to provide the care that they feel the patient needs for home.  TPN is the major barrier to finding a accepting facility.  Per TOC she is reached out to 40+ facilities without an accepting one with the majority claiming that TPN is the barrier to admission.  I discussed this at length with patient and patient's son [name] on phone.  Current plan of  care is to attempt to wean off of TPN.  If we are able to do so and patient can meet the majority of his nutritional needs we can get him to a skilled nursing facility and he can seek surgical follow-up at tertiary care center.  As of 8/26 patient is starting to eat more.  We are tapering TPN.  Current rate 83 mL/h.  Taper down to 42 mL/h  8/27: Patient became altered and delirious last night.  Hospital-acquired delirium versus urinary tract infection.  Unfortunately patient self discontinued his PICC line and pulled his ileostomy.  Ileostomy is in place and functioning appropriately.  After discussion with the PICC team and the IV nurse and the bedside RN decision was made to heparinize and access patient's implanted port and administer TPN via this route.  This will avoid placement of another PICC line and will allow [place] to continue parenteral nutrition.  8/29: Patient meeting about 50 to 60% of his nutritional goals.  Needs to be 90% or greater in order for [place] to safely discontinue TPN.   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[place]) Active Problems:   Essential hypertension   Type 2 diabetes mellitus with hyperlipidemia (HCC)   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HCC)   Lung metastases (HCC)   Post cholecystectomy intraabdominal abscess   S/P cholecystectomy   On total parenteral nutrition (TPN)   Malnutrition of moderate degree   Overweight (BMI 25.0-29.9)   Enterocutaneous fistula   Anemia of chronic disease   Hyponatremia   Cough   Pleural effusion   Acute  metabolic encephalopathy   Palliative care encounter  Enterocutaneous fistula Multiple fluid collections on CT abdomen Extensive and complex surgical history Majority of care at outside facility in [facility] surgery consulted here They will facilitate outpatient evaluation and referral to tertiary care facility however will be 6 months before any intervention Currently no recommendations of  intervention on the abdomen Plan: Ostomy care per nursing and Craig 9/3 WBC count elevated could be secondary to abdominal abscess Patient was on Cipro, added Flagyl as well on 07/13/21 As patient is full code so we will continue to treat as per family request.  Moderate protein calorie malnutrition Currently on TPN.  Patient doing better with oral intake TPN weaned to half rate as of 8/26 Unfortunately patient pulled out PICC line on 8/26 Port accessed and TPN to be administered via this route Oral intake has been improving but still not meeting nutritional goals Plan: Calorie count completed Patient still not meeting his nutritional goals Continue maximizing oral intake Attempt to wean off TPN entirely within the next few days Instructions given to nursing staff.   Every single staff member that walks into the patient's room during the course of the day needs to encourage him to eat   Urinary tract infection Repeat urinalysis on 8/26 nitrite positive Unclear whether this represents true infection but will treat as such given the encephalopathy Urine culture with greater than 100,000 colony-forming units gram negative rods Resistant to Rocephin Plan: Continue IV Rocephin Plan for 7-day antibiotic course, (8/29--9/4) Monitor vitals and fever curve  Acute metabolic encephalopathy Baseline is unclear however concern the patient was confused.   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 [name] as outpatient No active oncology issues however palliative care nurse practitioner [name] was consulted for goals of care At this time patient and family not interested in de-escalation of care Outpatient  follow-up  Hyponatremia Likely secondary to malnutrition and poor p.o. 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  At this point unclear to me whether patient would be safe at home environment.  I spoke at length to his son [name] and the patient lives alone and prior to his hospitalization at [facility] was apparently robust and fully independent.  There is been a significant decline in his functional status and at this point0 I am not sure that home environment would be safe disposition and would likely result in readmission.  At this point TPN is the major barrier to skilled nursing facility placement so we will attempt to wean from TPN if the patient is able to meet some of his nutritional goals.  Patient has been trying to eat more and has been receptive to encouragement from nursing staff  8/31 as per discussion with oncologist, patient's son is open for hospice care at home now and he is working to arrange 24/7 care at home.  In the meantime we will continue TPN as an inpatient, plan is to go home without TPN  when 24/7 care will be arranged and hospice will be consulted as well.   DVT prophylaxis: SQ Lovenox Code Status: Full Family Communication: [name] [phone] on 8/24, 8/25, 8/27, 8/29.  Left VM on 8/30,  Left VM on 9/3 Disposition Plan: Status is: Inpatient  Remains inpatient appropriate because:Inpatient level of care appropriate due to severity of illness  Dispo: The patient is from: Home              Anticipated d/c is to: Home with Hospice care and 24/7 arrangement private pay              Patient currently is not medically stable to DC, needs 24/7 home  care arrangement    Difficult to place patient No  Patient is not medically stable for discharge.  His nutritional status is poor and we are attempting to wean from TPN in order to secure a safe disposition plan to skilled nursing facility.  At this point in my medical opinion I feel that disposition home is not safe.  He has a very complex surgical abdomen and an ostomy that frequently leaks and requires specific attention and care.   Level of care: Med-Surg  Consultants:  General surgery Oncologist  Procedures:  None  Antimicrobials:  None   Subjective: No significant overnight issues, patient was resting comfortably, denied any abdominal pain, no shortness of breath, no chest pain or palpitations.  Patient still has decreased appetite    Objective: Vitals:   07/13/21 2036 07/14/21 0408 07/14/21 0513 07/14/21 0749  BP: 135/62  (!) 113/38 (!) 134/52  Pulse: 82  76 82  Resp: 14  14 16   Temp: 98 F (36.7 C)  97.7 F (36.5 C) (!) 96.2 F (35.7 C)  TempSrc: Axillary  Oral Axillary  SpO2: 98%  98% 100%  Weight:  65.7 kg    Height:        Intake/Output Summary (Last 24 hours) at 07/14/2021 1329 Last data filed at 07/14/2021 1042 Gross per 24 hour  Intake 2252.25 ml  Output 300 ml  Net 1952.25 ml   Filed Weights   07/11/21 0436 07/13/21 0427 07/14/21 0408  Weight: 68.2 kg 67.2 kg 65.7 kg    Examination:  General exam: Awake.  No visible distress Respiratory system: Lung sounds decreased at bases.  Normal work of breathing.  Room air Cardiovascular system: S1-S2, regular rate and rhythm, no murmurs, no pedal edema  gastrointestinal system: Thin, nontender, nondistended, normal bowel sounds Central nervous system: Awake and alert, no focal deficits Extremities: Symmetrically decreased power bilaterally upper and lower extremities Skin: Pale with scattered excoriations Psychiatry: Judgement and insight appear impaired. Mood & affect flattened.     Data Reviewed: I  have personally reviewed following labs and imaging studies  CBC: Recent Labs  Lab 07/11/21 0807 07/13/21 0432 07/14/21 0941  WBC 14.6* 22.1* 20.3*  NEUTROABS 10.2*  --   --   HGB 10.8* 10.3* 10.5*  HCT 33.7* 32.4* 32.5*  MCV 84.5 82.7 84.2  PLT 387 368 856   Basic Metabolic Panel: Recent Labs  Lab 07/08/21 0514 07/11/21 0539 07/12/21 0804 07/13/21 0432 07/14/21 0941  NA 130* 128* 129* 131* 128*  K 4.6 4.1 3.7 4.3 3.9  CL 103 102 104 107 104  CO2 19* 20* 22 21* 18*  GLUCOSE 251* 209* 72 88 189*  BUN 74* 45* 44* 51* 47*  CREATININE 1.11 0.78 0.73 0.82 0.82  CALCIUM 9.5 8.7* 8.6* 8.7* 8.3*  MG 2.2 1.9  2.1 2.2 2.0  PHOS 4.5 3.3 3.3 3.7 3.4   GFR: Estimated Creatinine Clearance: 63.8 mL/min (by C-G formula based on SCr of 0.82 mg/dL). Liver Function Tests: Recent Labs  Lab 07/08/21 0514 07/11/21 0539  AST 28 37  ALT 51* 53*  ALKPHOS 168* 147*  BILITOT 0.5 0.4  PROT 7.9 6.6  ALBUMIN 3.3* 2.8*   No results for input(s): LIPASE, AMYLASE in the last 168 hours. No results for input(s): AMMONIA in the last 168 hours. Coagulation Profile: No results for input(s): INR, PROTIME in the last 168 hours. Cardiac Enzymes: No results for input(s): CKTOTAL, CKMB, CKMBINDEX, TROPONINI in the last 168 hours. BNP (last 3 results) No results for input(s): PROBNP in the last 8760 hours. HbA1C: No results for input(s): HGBA1C in the last 72 hours. CBG: Recent Labs  Lab 07/13/21 2009 07/14/21 0026 07/14/21 0358 07/14/21 0741 07/14/21 1151  GLUCAP 316* 103* 84 168* 164*   Lipid Profile: No results for input(s): CHOL, HDL, LDLCALC, TRIG, CHOLHDL, LDLDIRECT in the last 72 hours.   Thyroid Function Tests: No results for input(s): TSH, T4TOTAL, FREET4, T3FREE, THYROIDAB in the last 72 hours. Anemia Panel: No results for input(s): VITAMINB12, FOLATE, FERRITIN, TIBC, IRON, RETICCTPCT in the last 72 hours. Sepsis Labs: No results for input(s): PROCALCITON, LATICACIDVEN in  the last 168 hours.   Recent Results (from the past 240 hour(s))  Urine Culture     Status: Abnormal   Collection Time: 07/05/21  4:25 PM   Specimen: Urine, Random  Result Value Ref Range Status   Specimen Description   Final    URINE, RANDOM Performed at [facility], [address] [place], [place] [address]    Special Requests   Final    NONE Performed at [facility], [place], [place] [address]    Culture >=100,000 COLONIES/mL ESCHERICHIA COLI (A)  Final   Report Status 07/08/2021 FINAL  Final   Organism ID, Bacteria ESCHERICHIA COLI (A)  Final      Susceptibility   Escherichia coli - MIC*    AMPICILLIN >=32 RESISTANT Resistant     CEFAZOLIN >=64 RESISTANT Resistant     CEFEPIME <=0.12 SENSITIVE Sensitive     CEFTRIAXONE 32 RESISTANT Resistant     CIPROFLOXACIN 0.5 SENSITIVE Sensitive     GENTAMICIN <=1 SENSITIVE Sensitive     IMIPENEM <=0.25 SENSITIVE Sensitive     NITROFURANTOIN <=16 SENSITIVE Sensitive     TRIMETH/SULFA <=20 SENSITIVE Sensitive     AMPICILLIN/SULBACTAM 16 INTERMEDIATE Intermediate     PIP/TAZO <=4 SENSITIVE Sensitive     * >=100,000 COLONIES/mL ESCHERICHIA COLI         Radiology Studies: No results found.      Scheduled Meds:  aspirin EC  81 mg Oral Daily   benzonatate  200 mg Oral TID   Chlorhexidine Gluconate Cloth  6 each Topical Daily   dexamethasone  4 mg Oral Daily   dronabinol  5 mg Oral BID AC   feeding supplement  237 mL Oral TID BM   furosemide  20 mg Oral Daily   insulin aspart  0-15 Units Subcutaneous Q4H   insulin aspart  10 Units Subcutaneous Q4H   insulin glargine-yfgn  12 Units Subcutaneous BID   loratadine  10 mg Oral Daily   losartan  25 mg Oral Daily   mirtazapine  15 mg Oral QHS   pantoprazole  40 mg Oral Daily   pregabalin  75 mg Oral BID  sodium chloride flush  10-40 mL Intracatheter Q12H   Continuous Infusions:  sodium chloride Stopped (07/12/21 1614)   ciprofloxacin  400 mg (07/14/21 1025)   dextrose 75 mL/hr at 07/14/21 1201   metronidazole 500 mg (07/14/21 1159)   TPN ADULT (ION) Stopped (07/13/21 2158)   TPN ADULT (ION)       LOS: 18 days    Time spent: 25 minutes    Val Riles, MD [facility] Pager [phone]  If 7PM-7AM, please contact night-coverage 07/14/2021, 1:29 PM

## 2021-07-14 NOTE — Consult Note (Signed)
PHARMACY - TOTAL PARENTERAL NUTRITION CONSULT NOTE   Indication: Fistula  Patient Measurements: Height: 5' 5.98" (167.6 cm) Weight: 65.7 kg (144 lb 13.5 oz) IBW/kg (Calculated) : 63.76 TPN AdjBW (KG): 74.4 Body mass index is 23.39 kg/m.  Assessment:  Patient is an 80 y/o M with medical history including diabetes, HTN, colon cancer with lung metastases, recent history of admission at OSH for cholecystitis s/p cholecystectomy c/b post-op sepsis. Patient underwent ExLap with findings of small bowel injury which was repaired. 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 He is now presenting to the hospital for ileostomy leakage. Imaging notable for multiple intra-abdominal abscess with contrast extravasation to midline of abdominal wound. General surgery is following.   Pharmacy consulted to continue home TPN while patient is admitted.  Glucose / Insulin: History of controlled diabetes (last Hgb A1c 6.9%).    Novolog coverage 10 units q4hours + SSI q4 +  12 units BID glargine  Electrolytes: Hyponatremia (129), Potassium 4.1>3.7  (pt on lasix 20mg  po daily) Renal: SCr stable 0.73 Hepatic: AST 37/ALT 53 Intake / Output; MIVF:   GI Imaging: 8/17 CT abdomen / pelvis: Large ventral wound containing small amount of contrast. Enterocutaneous fistula. Multiple intra-abdominal abscess(s). Mesenteric inflammation. Small bilateral pleural effusions with atelectasis or pneumonia at the bases. Metastatic lung nodules, slight increased size of right lower lobe metastatic nodule  GI Surgeries / Procedures:  History of recent cholecystectomy, ExLap at OSH in 05/2021  Central access: Port (see note above) TPN start date: 8/18   Outpatient TPN information: [facility] in [place], [place] Ph: [phone]  Nutritional Goals: Goal TPN rate was 83 mL/hr (provides 105.6 g of protein and 2262 kcals per day) - now weaning  RD  Assessment: Estimated Needs Total Energy Estimated Needs: 2000-2300kcal/day Total Protein Estimated Needs: 100-115g/day Total Fluid Estimated Needs: 2.0-2.3L/day  Current Nutrition:  TPN dysphagia diet, Ensure TID  Plan:  Continue TPN at 42 mL/hr (total volume w/ overfill 1108 mL)  48h calorie count: meeting about 50% of his estimated needs via oral intake (plan: maximize oral nutrition so that TPN can be discontinued and then SNF placement can occur) Nutritional components: Amino acids (Travasol 10%): 53.4 grams Lipids (20% SMOF): 34.8 grams Dextrose: 171.36 grams kCal: 1143 / 24h Electrolytes in TPN: Na 80 mEq/L (increased from 75 to 80 meq/L on 9/2), KCl 15 mEq/L, Ca 5 mEq/L, Mg 51mEq/L, and Phos 53mmol/L. Cl:Ac 1:1 Add standard MVI and trace elements to TPN Patient on dexamethasone daily. Continue Symglee(insulin glargine) 12 units BID and Novolog to 10 units q4 hr + SSI moderate - will monitor carefully, and hold when needed Monitor TPN labs on Mon/Thurs, daily until stable   Oswald Hillock, PharmD Clinical Pharmacist 07/14/2021 9:49 AM

## 2021-07-14 NOTE — Progress Notes (Signed)
   07/14/21 1604  Notify: Provider  Provider Name/Title Dr. Val Riles  Date Provider Notified 07/14/21  Time Provider Notified [phone]  Notification Type Page  Notification Reason Critical result (Blood glucose 63 then 81)  Provider response See new orders (Discontinued scheduled 10 units)  Date of Provider Response 07/14/21  Time of Provider Response 1605

## 2021-07-15 DIAGNOSIS — B999 Unspecified infectious disease: Secondary | ICD-10-CM

## 2021-07-15 LAB — CBC WITH DIFFERENTIAL/PLATELET
Abs Immature Granulocytes: 0.14 10*3/uL — ABNORMAL HIGH (ref 0.00–0.07)
Basophils Absolute: 0 10*3/uL (ref 0.0–0.1)
Basophils Relative: 0 %
Eosinophils Absolute: 0 10*3/uL (ref 0.0–0.5)
Eosinophils Relative: 0 %
HCT: 30.4 % — ABNORMAL LOW (ref 39.0–52.0)
Hemoglobin: 9.7 g/dL — ABNORMAL LOW (ref 13.0–17.0)
Immature Granulocytes: 1 %
Lymphocytes Relative: 13 %
Lymphs Abs: 2.4 10*3/uL (ref 0.7–4.0)
MCH: 26.2 pg (ref 26.0–34.0)
MCHC: 31.9 g/dL (ref 30.0–36.0)
MCV: 82.2 fL (ref 80.0–100.0)
Monocytes Absolute: 1.3 10*3/uL — ABNORMAL HIGH (ref 0.1–1.0)
Monocytes Relative: 7 %
Neutro Abs: 14.6 10*3/uL — ABNORMAL HIGH (ref 1.7–7.7)
Neutrophils Relative %: 79 %
Platelets: 359 10*3/uL (ref 150–400)
RBC: 3.7 MIL/uL — ABNORMAL LOW (ref 4.22–5.81)
RDW: 17.7 % — ABNORMAL HIGH (ref 11.5–15.5)
WBC: 18.5 10*3/uL — ABNORMAL HIGH (ref 4.0–10.5)
nRBC: 0 % (ref 0.0–0.2)

## 2021-07-15 LAB — COMPREHENSIVE METABOLIC PANEL
ALT: 51 U/L — ABNORMAL HIGH (ref 0–44)
AST: 35 U/L (ref 15–41)
Albumin: 2.6 g/dL — ABNORMAL LOW (ref 3.5–5.0)
Alkaline Phosphatase: 129 U/L — ABNORMAL HIGH (ref 38–126)
Anion gap: 4 — ABNORMAL LOW (ref 5–15)
BUN: 39 mg/dL — ABNORMAL HIGH (ref 8–23)
CO2: 22 mmol/L (ref 22–32)
Calcium: 8.2 mg/dL — ABNORMAL LOW (ref 8.9–10.3)
Chloride: 103 mmol/L (ref 98–111)
Creatinine, Ser: 0.76 mg/dL (ref 0.61–1.24)
GFR, Estimated: >60 mL/min (ref >60)
Glucose, Bld: 126 mg/dL — ABNORMAL HIGH (ref 70–99)
Potassium: 3.6 mmol/L (ref 3.5–5.1)
Sodium: 129 mmol/L — ABNORMAL LOW (ref 135–145)
Total Bilirubin: 0.6 mg/dL (ref 0.3–1.2)
Total Protein: 6.1 g/dL — ABNORMAL LOW (ref 6.5–8.1)

## 2021-07-15 LAB — GLUCOSE, CAPILLARY
Glucose-Capillary: 136 mg/dL — ABNORMAL HIGH (ref 70–99)
Glucose-Capillary: 149 mg/dL — ABNORMAL HIGH (ref 70–99)
Glucose-Capillary: 184 mg/dL — ABNORMAL HIGH (ref 70–99)
Glucose-Capillary: 237 mg/dL — ABNORMAL HIGH (ref 70–99)
Glucose-Capillary: 251 mg/dL — ABNORMAL HIGH (ref 70–99)
Glucose-Capillary: 326 mg/dL — ABNORMAL HIGH (ref 70–99)

## 2021-07-15 LAB — PHOSPHORUS: Phosphorus: 3.1 mg/dL (ref 2.5–4.6)

## 2021-07-15 LAB — MAGNESIUM: Magnesium: 1.9 mg/dL (ref 1.7–2.4)

## 2021-07-15 LAB — TRIGLYCERIDES: Triglycerides: 53 mg/dL (ref <150)

## 2021-07-15 MED ORDER — CIPROFLOXACIN IN D5W 400 MG/200ML IV SOLN
400.0000 mg | Freq: Two times a day (BID) | INTRAVENOUS | Status: AC
  Filled 2021-07-15 (×2): qty 200

## 2021-07-15 MED ORDER — PIPERACILLIN-TAZOBACTAM 3.375 G IVPB 30 MIN
3.3750 g | Freq: Three times a day (TID) | INTRAVENOUS | Status: DC
  Administered 2021-07-16 – 2021-07-17 (×4): 3.375 g via INTRAVENOUS
  Filled 2021-07-15 (×10): qty 50

## 2021-07-15 MED ORDER — TRAVASOL 10 % IV SOLN
INTRAVENOUS | Status: AC
  Filled 2021-07-15: qty 534.2

## 2021-07-15 NOTE — Consult Note (Signed)
PHARMACY - TOTAL PARENTERAL NUTRITION CONSULT NOTE   Indication: Fistula  Patient Measurements: Height: 5' 5.98" (167.6 cm) Weight: 66.1 kg (145 lb 11.6 oz) IBW/kg (Calculated) : 63.76 TPN AdjBW (KG): 74.4 Body mass index is 23.53 kg/m.  Assessment:  Patient is an 80 y/o M with medical history including diabetes, HTN, colon cancer with lung metastases, recent history of admission at OSH for cholecystitis s/p cholecystectomy c/b post-op sepsis. Patient underwent ExLap with findings of small bowel injury which was repaired. 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 He is now presenting to the hospital for ileostomy leakage. Imaging notable for multiple intra-abdominal abscess with contrast extravasation to midline of abdominal wound. General surgery is following.   Pharmacy consulted to continue home TPN while patient is admitted.  Glucose / Insulin: History of controlled diabetes (last Hgb A1c 6.9%).   Novolog  SSI q4 ( 24 units/24 hrs)+  12 units BID glargine (Scheduled SSI 10 units q4h has been discontinued by MD)  Electrolytes: Hyponatremia (129), Potassium 3.6  (pt on lasix 20mg  po daily) Renal: SCr stable 0.73 Hepatic: AST 37/ALT 53   TG 53 Intake / Output; MIVF:   GI Imaging: 8/17 CT abdomen / pelvis: Large ventral wound containing small amount of contrast. Enterocutaneous fistula. Multiple intra-abdominal abscess(s). Mesenteric inflammation. Small bilateral pleural effusions with atelectasis or pneumonia at the bases. Metastatic lung nodules, slight increased size of right lower lobe metastatic nodule  GI Surgeries / Procedures:  History of recent cholecystectomy, ExLap at OSH in 05/2021  Central access: Port (see note above) TPN start date: 8/18   Outpatient TPN information: [facility] in [place], [place] Ph: [phone]  Nutritional Goals: Goal TPN rate was 83 mL/hr (provides 105.6 g of  protein and 2262 kcals per day) - now weaning  RD Assessment: Estimated Needs Total Energy Estimated Needs: 2000-2300kcal/day Total Protein Estimated Needs: 100-115g/day Total Fluid Estimated Needs: 2.0-2.3L/day  Current Nutrition:  TPN dysphagia 3 diet, Ensure TID  Plan:  Continue TPN at 1/2 rate- 42 mL/hr (total volume w/ overfill 1108 mL)  48h calorie count: meeting about 50% of his estimated needs via oral intake (plan: maximize oral nutrition so that TPN can be discontinued and then SNF placement can occur) Nutritional components: Amino acids (Travasol 10%): 53.4 grams Lipids (20% SMOF): 35.2 grams Dextrose: 166.32 grams kCal: 1131 / 24h Electrolytes in TPN: Na 85 mEq/L (increased from 80 to 85 meq/L on 9/5), KCl 15 mEq/L, Ca 5 mEq/L, Mg 39mEq/L, and Phos 93mmol/L. Cl:Ac 1:1 Will order Magnesium sulfate 2 gm IV x1 Add standard MVI and trace elements to TPN Patient on dexamethasone daily. Continue Symglee(insulin glargine) 12 units BID and + SSI moderate - will monitor carefully, and hold when needed Monitor TPN labs on Mon/Thurs, daily until stable   Miguel Flores A, PharmD Clinical Pharmacist 07/15/2021 11:10 AM

## 2021-07-15 NOTE — Progress Notes (Signed)
PROGRESS NOTE    Miguel Flores  XAJ:[id number] DOB: 1941-06-06 DOA: 06/26/2021 PCP: [name], MD   Brief Narrative: Miguel Flores is a 80 y.o. 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 Patient presented secondary to Eakin pouch leaking/complications from enterocutaneous fistula. Patient found to have multiple abdominal fluid collections concerning for abscesses and decision initially was to treat conservatively. While admitted, plan was for discharge to SNF which was complicated by TPN usage. Plan was to wean patient off TPN. He developed a UTI which was treated. Now with worsened WBC in setting of known abscesses. Eventual plan is for home without hospice or home health; family is arranging.   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Miguel Flores) Active Problems:   Essential hypertension   Type 2 diabetes mellitus with hyperlipidemia (HCC)   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HCC)   Lung metastases (HCC)   Post cholecystectomy intraabdominal abscess   S/P cholecystectomy   On total parenteral nutrition (TPN)   Malnutrition of moderate degree   Overweight (BMI 25.0-29.9)   Enterocutaneous fistula   Anemia of chronic disease   Hyponatremia   Cough   Pleural effusion   Acute metabolic encephalopathy   Palliative care encounter   Intra-abdominal abscesses Three larger abscess with multiple small abscesses seen on CT scan from 8/17. Patient has been on antibiotics for UTI. Recently worsened leukocytosis possibly related to abscesses but unsure.  -Repeat CT abdomen/pelvis with contrast today -General surgery and infectious disease consults since patient with elevated WBC -Since he has been on ciprofloxacin/Flagyl, will continue. Possibly could switch to Ceftriaxone but will defer to ID  Enterocutaneous fistula Present on  admission. -WOC for ostomy care  Moderate protein calorie nutrition Patient presented on TPN. Attempt to wean TPN off and to manage nutritional needs via PO intake. TPN weaned down to 50%. Do not think goal will be to wean TPN at this time since patient plans on discharging home when stable for discharge. -Continue TPN per pharmacy -Dietitian recommendations  Acute metabolic encephalopathy Possibly related to UTI infection. Appears to have resolved.  Urinary tract infection Urine culture (8/26) significant for E. Coli, resistant to ampicillin/ceftriaxone. Patient treated with 7 days of ciprofloxacin IV.  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 [name]. Currently on chemotherapy.  Hyponatremia Mild. Asymptomatic. Thought to be secondary to poor oral intake. -Urine sodium/osmolality  Primary hypertension -Continue losartan  Diabetes mellitus type 2 Polyneuropathy -Continue Semglee, SSI, Lyrica  Goals of care Patient is not interested in EOL care. Plan is for home discharge when medically ready. Patient follows with medical oncology as an outpatient for chemotherapy.    DVT prophylaxis: SCDs Code Status:   Code Status: Full Code Family Communication: Son on telephone 18 minutes Disposition Plan: Discharge home. Family declining home health. Unable to find a SNF that will accept TPN. Discharge not likely for several days and will be pending management recommendations for abdominal abscesses and safe home discharge plan.   Consultants:  General surgery Palliative care medicine Infectious disease  Procedures:  None  Antimicrobials: Ceftriaxone IV Ciprofloxacin IV Flagyl IV    Subjective: Patient reports no issues overnight. Long discussion at bedside. Patient desires to continue medical management. No desire for comfort measures. Tearful multiple times during encounter. Asked me to speak to his son with regard to his medical care as well as  his son handles  his estate. We discussed end of life care and hospice; patient is not currently interested in this option.   Objective: Vitals:   07/15/21 0400 07/15/21 0412 07/15/21 0700 07/15/21 1525  BP: (!) 116/58  (!) 109/45 (!) 116/49  Pulse:    79  Resp: 16  16 16   Temp: 98.6 F (37 C)  98.1 F (36.7 C) 98.2 F (36.8 C)  TempSrc: Oral  Oral Oral  SpO2:   100% 100%  Weight:  66.1 kg    Height:        Intake/Output Summary (Last 24 hours) at 07/15/2021 1915 Last data filed at 07/15/2021 1854 Gross per 24 hour  Intake 2337.84 ml  Output 975 ml  Net 1362.84 ml   Filed Weights   07/13/21 0427 07/14/21 0408 07/15/21 0412  Weight: 67.2 kg 65.7 kg 66.1 kg    Examination:  General exam: Appears calm and comfortable Respiratory system: Clear to auscultation. Respiratory effort normal. Cardiovascular system: S1 & S2 heard, RRR. No murmurs, rubs, gallops or clicks. Gastrointestinal system: Abdomen is nondistended, soft and nontender. No organomegaly or masses felt. Normal bowel sounds heard. Central nervous system: Alert and oriented. No focal neurological deficits. Musculoskeletal: No edema. No calf tenderness Skin: No cyanosis. No rashes Psychiatry: Judgement and insight appear normal. Labile mood. Slightly blunt affect.    Data Reviewed: I have personally reviewed following labs and imaging studies  CBC Lab Results  Component Value Date   WBC 18.5 (H) 07/15/2021   RBC 3.70 (L) 07/15/2021   HGB 9.7 (L) 07/15/2021   HCT 30.4 (L) 07/15/2021   MCV 82.2 07/15/2021   MCH 26.2 07/15/2021   PLT 359 07/15/2021   MCHC 31.9 07/15/2021   RDW 17.7 (H) 07/15/2021   LYMPHSABS 2.4 07/15/2021   MONOABS 1.3 (H) 07/15/2021   EOSABS 0.0 07/15/2021   BASOSABS 0.0 76/19/5093     Last metabolic panel Lab Results  Component Value Date   NA 129 (L) 07/15/2021   K 3.6 07/15/2021   CL 103 07/15/2021   CO2 22 07/15/2021   BUN 39 (H) 07/15/2021   CREATININE 0.76 07/15/2021    GLUCOSE 126 (H) 07/15/2021   GFRNONAA >60 07/15/2021   GFRAA >60 07/24/2020   CALCIUM 8.2 (L) 07/15/2021   PHOS 3.1 07/15/2021   PROT 6.1 (L) 07/15/2021   ALBUMIN 2.6 (L) 07/15/2021   BILITOT 0.6 07/15/2021   ALKPHOS 129 (H) 07/15/2021   AST 35 07/15/2021   ALT 51 (H) 07/15/2021   ANIONGAP 4 (L) 07/15/2021    CBG (last 3)  Recent Labs    07/15/21 0733 07/15/21 1148 07/15/21 1617  GLUCAP 149* 184* 326*     GFR: Estimated Creatinine Clearance: 65.4 mL/min (by C-G formula based on SCr of 0.76 mg/dL).  Coagulation Profile: No results for input(s): INR, PROTIME in the last 168 hours.  No results found for this or any previous visit (from the past 240 hour(s)).      Radiology Studies: No results found.      Scheduled Meds:  aspirin EC  81 mg Oral Daily   benzonatate  200 mg Oral TID   Chlorhexidine Gluconate Cloth  6 each Topical Daily   dexamethasone  4 mg Oral Daily   dronabinol  5 mg Oral BID AC   feeding supplement  237 mL Oral TID BM   furosemide  20 mg Oral Daily   insulin aspart  0-15 Units Subcutaneous Q4H   insulin glargine-yfgn  12 Units Subcutaneous BID  loratadine  10 mg Oral Daily   losartan  25 mg Oral Daily   mirtazapine  15 mg Oral QHS   pantoprazole  40 mg Oral Daily   pregabalin  75 mg Oral BID   sodium chloride flush  10-40 mL Intracatheter Q12H   Continuous Infusions:  sodium chloride Stopped (07/12/21 1614)   metronidazole Stopped (07/15/21 1454)   TPN ADULT (ION) 42 mL/hr at 07/15/21 1756     LOS: 19 days     Cordelia Poche, MD [facility] 07/15/2021, 7:15 PM  If 7PM-7AM, please contact night-coverage [email or web address]

## 2021-07-15 NOTE — Plan of Care (Signed)

## 2021-07-15 NOTE — Progress Notes (Signed)
ID Consulted for antibiotic management Pt in the hospital since 06/26/21 when he came in with leaking enterocutaneous fistula as the ostomy bag was leaking. He has a h/o cholecystectomy complicated by intestinal perforation leading to mulitple surgeries in July 2022 in [place]. Eventhually he developed ECF and was placed on PICC for TPN- was diacgared on 06/22/21 to [place] CT abdomen done on 8/17   showed Multiple mildly rim enhancing intra-abdominal fluid collections. 5 x 2.6 cm right mid abdominal collection, series 2, image 44. 2.4 x 1.8 cm fluid collection in the right mid lateral abdomen adjacent to small bowel, series 2, image 47 5.2 x 1.3 cm lenticular shaped collection within the left mid abdomen, coronal series 6, image 32. Multiple additional smaller rim enhancing collections within the abdomen concerning for abscess. Small rim enhancing perihepatic and perisplenic fluid collections. Generalized soft tissue stranding within the mesentery which is new compared to prior. As he was afebrile with normal WBC, no sntibiotic was started. He was having delirium and confusion a few days later and he was started on ceftriaxone for possible UTI on 8/27- Urine sent on 8/26 had no wbc but grew e.coli which was resistant to ceftriaxone.He was changed to cipro on 8/29 and has been on cipro since then. Because of wbc > 20 flagyl was started on 07/13/21. Today Dr.Nettey who took over the service wanted me to see the patient for antibiotic management Will do a formal consult tomorrow Recommend repeat CT abdomen to look for the status of the various collections Patient has metastatic colon cancer with lung mets- Would recommend CT head /MRI to r/o mets because of his confusion Will change cipro /flagyl to zosyn

## 2021-07-16 ENCOUNTER — Inpatient Hospital Stay: Payer: Medicare Other

## 2021-07-16 ENCOUNTER — Encounter: Payer: Self-pay | Admitting: Internal Medicine

## 2021-07-16 DIAGNOSIS — T8143XA Infection following a procedure, organ and space surgical site, initial encounter: Secondary | ICD-10-CM | POA: Diagnosis not present

## 2021-07-16 DIAGNOSIS — K9413 Enterostomy malfunction: Secondary | ICD-10-CM | POA: Diagnosis not present

## 2021-07-16 LAB — GLUCOSE, CAPILLARY
Glucose-Capillary: 154 mg/dL — ABNORMAL HIGH (ref 70–99)
Glucose-Capillary: 162 mg/dL — ABNORMAL HIGH (ref 70–99)
Glucose-Capillary: 171 mg/dL — ABNORMAL HIGH (ref 70–99)
Glucose-Capillary: 183 mg/dL — ABNORMAL HIGH (ref 70–99)
Glucose-Capillary: 247 mg/dL — ABNORMAL HIGH (ref 70–99)
Glucose-Capillary: 269 mg/dL — ABNORMAL HIGH (ref 70–99)
Glucose-Capillary: 328 mg/dL — ABNORMAL HIGH (ref 70–99)

## 2021-07-16 LAB — CBC
HCT: 29.4 % — ABNORMAL LOW (ref 39.0–52.0)
Hemoglobin: 9.3 g/dL — ABNORMAL LOW (ref 13.0–17.0)
MCH: 26.3 pg (ref 26.0–34.0)
MCHC: 31.6 g/dL (ref 30.0–36.0)
MCV: 83.1 fL (ref 80.0–100.0)
Platelets: 292 10*3/uL (ref 150–400)
RBC: 3.54 MIL/uL — ABNORMAL LOW (ref 4.22–5.81)
RDW: 17.6 % — ABNORMAL HIGH (ref 11.5–15.5)
WBC: 14.2 10*3/uL — ABNORMAL HIGH (ref 4.0–10.5)
nRBC: 0 % (ref 0.0–0.2)

## 2021-07-16 LAB — OSMOLALITY, URINE: Osmolality, Ur: 689 mOsm/kg (ref 300–900)

## 2021-07-16 LAB — SODIUM, URINE, RANDOM: Sodium, Ur: <10 mmol/L

## 2021-07-16 MED ORDER — IOHEXOL 9 MG/ML PO SOLN
1000.0000 mL | Freq: Once | ORAL | Status: DC | PRN
Start: 2021-07-16 — End: 2021-07-25

## 2021-07-16 MED ORDER — IOHEXOL 350 MG/ML SOLN
75.0000 mL | Freq: Once | INTRAVENOUS | Status: AC | PRN
  Administered 2021-07-16: 75 mL via INTRAVENOUS

## 2021-07-16 MED ORDER — TRAVASOL 10 % IV SOLN
INTRAVENOUS | Status: AC
  Filled 2021-07-16: qty 534.2

## 2021-07-16 NOTE — Progress Notes (Signed)
CT was ordered with contrast, but patient stated he was unable to drink anything during time that CT wanted to perform. Per CT they had attempted to contact provider whom placed order to see if he wanted to perform without contrast and were unsuccessful in reaching provider. Patient refused to drink scheduled Ensure during the night as well. Will notify dayshift RN to follow up with providers on day shift regarding CT with or without contrast.

## 2021-07-16 NOTE — Consult Note (Signed)
PHARMACY - TOTAL PARENTERAL NUTRITION CONSULT NOTE   Indication: Fistula  Patient Measurements: Height: 5' 5.98" (167.6 cm) Weight: 68.1 kg (150 lb 2.1 oz) IBW/kg (Calculated) : 63.76 TPN AdjBW (KG): 74.4 Body mass index is 24.24 kg/m.  Assessment:  Patient is an 80 y/o M with medical history including diabetes, HTN, colon cancer with lung metastases, recent history of admission at OSH for cholecystitis s/p cholecystectomy c/b post-op sepsis. Patient underwent ExLap with findings of small bowel injury which was repaired. 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 He is now presenting to the hospital for ileostomy leakage. Imaging notable for multiple intra-abdominal abscess with contrast extravasation to midline of abdominal wound. General surgery is following.   Pharmacy consulted to continue home TPN while patient is admitted.  Glucose / Insulin: History of controlled diabetes (last Hgb A1c 6.9%).  BG 126 - 326 Novolog  SSI q4  +  12 units BID glargine  Electrolytes: Hyponatremia (129), Potassium 3.6  (pt on lasix 20mg  po daily) Renal: SCr stable 0.73 Hepatic: AST 37/ALT 53   TG 53 Intake / Output; MIVF:   GI Imaging: 8/17 CT abdomen / pelvis: Large ventral wound containing small amount of contrast. Enterocutaneous fistula. Multiple intra-abdominal abscess(s). Mesenteric inflammation. Small bilateral pleural effusions with atelectasis or pneumonia at the bases. Metastatic lung nodules, slight increased size of right lower lobe metastatic nodule  GI Surgeries / Procedures:  History of recent cholecystectomy, ExLap at OSH in 05/2021  Central access: Port (see note above) TPN start date: 8/18   Outpatient TPN information: [facility] in [place], [place] Ph: [phone]  Nutritional Goals: Goal TPN rate was 83 mL/hr (provides 105.6 g of protein and 2262 kcals per day) - now weaning  RD  Assessment: Estimated Needs Total Energy Estimated Needs: 2000-2300kcal/day Total Protein Estimated Needs: 100-115g/day Total Fluid Estimated Needs: 2.0-2.3L/day  Current Nutrition:  TPN dysphagia 3 diet, Ensure TID  Plan:  Continue TPN at 1/2 rate- 42 mL/hr (total volume w/ overfill 1108 mL)  48h calorie count: meeting about 50% of his estimated needs via oral intake (plan: maximize oral nutrition so that TPN can be discontinued and then SNF placement can occur) Nutritional components: Amino acids (Travasol 10%): 53.4 grams Lipids (20% SMOF): 35.2 grams Dextrose: 166.32 grams kCal: 1131 / 24h Electrolytes in TPN: Na 85 mEq/L (increased from 80 to 85 meq/L on 9/5), KCl 15 mEq/L, Ca 5 mEq/L, Mg 29mEq/L, and Phos 45mmol/L. Cl:Ac 1:1 Add standard MVI and trace elements to TPN Patient on dexamethasone daily. Continue Symglee(insulin glargine) 12 units BID and + SSI moderate - will monitor carefully, and hold when needed Monitor TPN labs on Mon/Thurs, daily until stable   Dallie Piles, PharmD Clinical Pharmacist 07/16/2021 7:12 AM

## 2021-07-16 NOTE — Plan of Care (Addendum)
Re-attempted to give contrast to patient but refusing to drink d/t taste - AMS sporadic.  Will continue to attempt - Dr. Informed and considering if possible w/out contrast.  Will assess /round when able.   [place] to get 1 contrast bottle down, but patient now pushing away and says he's full.  Informed Dr. And CT to please call for transport.

## 2021-07-16 NOTE — Progress Notes (Signed)
Nutrition Follow Up Note   DOCUMENTATION CODES:   Non-severe (moderate) malnutrition in context of chronic illness  INTERVENTION:   Continue TPN per pharmacy- currently at half rate   Ensure Enlive po TID, each supplement provides 350 kcal and 20 grams of protein  Magic cup TID with meals, each supplement provides 290 kcal and 9 grams of protein  NUTRITION DIAGNOSIS:   Moderate Malnutrition related to cancer and cancer related treatments as evidenced by moderate fat depletion, moderate muscle depletion, severe muscle depletion.  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  Pt continues on TPN at half rate via his port. Pt with confusion. Pt's oral intake has declined; pt documented to be eating only sips and bites of meals and is refusing Ensure supplements. Per chart, pt is down ~15lbs(9%) since admission; this is significant weight loss. Pt has been unable to find placement on TPN and is not able to meet his estimated needs without it. Family has been working on setting up 24 hour private pay care at home. Palliative care is following; last note from 9/1 reports plans for family to take patient home and keep him comfortable. Pt would not discharge home on TPN if goal is for comfort but family requested for pt to stay on TPN while he is in the hospital. Discussed plans with MD today; plan is for family meeting.  May need to consider increasing TPN back to goal rate if family wishes to continue full aggressive care.   Medications reviewed and include: aspirin, dexamethasone, marinol, lasix, insulin, mirtazapine, protonix, zosyn   Labs reviewed: Na 129(L), K 3.6 wnl, BUN 39(H), P 3.1 wnl, Mg 1.9 wnl Triglycerides 53 Wbc- 14.2(H), Hgb 9.3(L), Hct 29.4(L) Cbgs- 154, 162, 183, 171 x 24 hrs  Diet Order:    Diet Order             DIET DYS 3 Room service appropriate? Yes; Fluid consistency: Thin  Diet effective now                  EDUCATION NEEDS:   Education needs have been addressed  Skin:  Skin Assessment: Reviewed RN Assessment (midabdominal wound with EC fistula 3.5 x 2.5 x 1 cm)  Last BM:  9/5- type 7  Height:   Ht Readings from Last 1 Encounters:  06/27/21 5' 5.98" (1.676 m)    Weight:   Wt Readings from Last 1 Encounters:  07/16/21 68.1 kg    Ideal Body Weight:  64.5 kg  BMI:  Body mass index is 24.24 kg/m.  Estimated Nutritional Needs:   Kcal:  2000-2300kcal/day  Protein:  100-115g/day  Fluid:  2.0-2.3L/day  Koleen Distance MS, RD, LDN Please refer to [facility] for RD and/or RD on-call/weekend/after hours pager

## 2021-07-16 NOTE — Progress Notes (Signed)
OT Cancellation Note  Patient Details Name: Miguel Flores MRN: [id number] DOB: 01-02-1941   Cancelled Treatment:    Reason Eval/Treat Not Completed: Other (comment). Upon attempt, pt with RN and MD for care. Will re-attempt OT tx at later date/time as pt is available and medically appropriate.   Hanley Hays, MPH, MS, OTR/L ascom [phone] 07/16/21, 9:42 AM

## 2021-07-16 NOTE — Progress Notes (Signed)
[facility] [place] NAME: Jay Haskew    MR#:  [id number]  DATE OF BIRTH:  03/21/1941  SUBJECTIVE:  contrast per CT scan. Denies any abdominal pain. No other issues per RN. PO intake still remains poor. Receiving IV TPN  REVIEW OF SYSTEMS:   Review of Systems  Constitutional:  Negative for chills, fever and weight loss.  HENT:  Negative for ear discharge, ear pain and nosebleeds.   Eyes:  Negative for blurred vision, pain and discharge.  Respiratory:  Negative for sputum production, shortness of breath, wheezing and stridor.   Cardiovascular:  Negative for chest pain, palpitations, orthopnea and PND.  Gastrointestinal:  Negative for abdominal pain, diarrhea, nausea and vomiting.  Genitourinary:  Negative for frequency and urgency.  Musculoskeletal:  Negative for back pain and joint pain.  Neurological:  Positive for weakness. Negative for sensory change, speech change and focal weakness.  Psychiatric/Behavioral:  Negative for depression and hallucinations. The patient is not nervous/anxious.   Tolerating Diet: Tolerating PT: HHPT  DRUG ALLERGIES:  No Known Allergies  VITALS:  Blood pressure (!) 95/56, pulse 74, temperature 98.1 F (36.7 C), temperature source Oral, resp. rate 18, height 5' 5.98" (1.676 m), weight 68.1 kg, SpO2 98 %.  PHYSICAL EXAMINATION:   Physical Exam  GENERAL:  80 y.o.-year-old patient lying in the bed with no acute distress.   weak LUNGS: Normal breath sounds bilaterally, no wheezing, rales, rhonchi. No use of accessory muscles of respiration.  CARDIOVASCULAR: S1, S2 normal. No murmurs, rubs, or gallops.  ABDOMEN: Soft, nontender, nondistended. Ileostomy bag+ EXTREMITIES: No cyanosis, clubbing or edema b/l.    NEUROLOGIC: non focal PSYCHIATRIC:  patient is alert and awake SKIN: No obvious rash, lesion, or ulcer.   LABORATORY PANEL:  CBC Recent Labs  Lab 07/16/21 0542  WBC 14.2*  HGB 9.3*  HCT  29.4*  PLT 292    Chemistries  Recent Labs  Lab 07/15/21 0459  NA 129*  K 3.6  CL 103  CO2 22  GLUCOSE 126*  BUN 39*  CREATININE 0.76  CALCIUM 8.2*  MG 1.9  AST 35  ALT 51*  ALKPHOS 129*  BILITOT 0.6   Cardiac Enzymes No results for input(s): TROPONINI in the last 168 hours. RADIOLOGY:  CT ABDOMEN PELVIS W CONTRAST  Result Date: 07/16/2021 CLINICAL DATA:  80 year old male, abdominal abscess suspected. EXAM: CT ABDOMEN AND PELVIS WITHOUT CONTRAST TECHNIQUE: Multidetector CT imaging of the abdomen and pelvis was performed following the standard protocol without IV contrast. COMPARISON:  06/26/2021, 03/13/2021 FINDINGS: Lower chest: Continued interval enlargement of previously visualized right lower lobe (22 x 18 mm, previously 16 x 14 mm) and lingular (incompletely visualized, measuring up to 14 mm, previously 13 mm) pulmonary metastases. No acute abnormality or new metastatic lesion. Interval resolution of previously visualized bilateral pleural effusions. Hepatobiliary: No focal liver abnormality is seen. Status post cholecystectomy. No biliary dilatation. Pancreas: Unremarkable. No pancreatic ductal dilatation or surrounding inflammatory changes. Spleen: Normal in size without focal abnormality. Adrenals/Urinary Tract: Adrenal glands are unremarkable. Kidneys are normal, without renal calculi, focal lesion, or hydronephrosis. Bladder is unremarkable. Stomach/Bowel: Stomach is within normal limits. Appendix appears normal. Again seen is a midline, periumbilical enteric cutaneous fistula which remains widely patent. Postsurgical changes after descending colon colostomy. No evidence of bowel wall thickening, distention, or inflammatory changes. Vascular/Lymphatic: Aortic atherosclerosis. No enlarged abdominal or pelvic lymph nodes. Decreased peripherally visualized mesenteric stranding. Reproductive: Prostate calcifications are again seen. Other: Interval  decreased size of previously  visualized right mid abdominal mesenteric peripherally enhancing fluid collection now measuring up to 5.1 x 1.2 cm (series 2, image 39), previously approximately 5 point 0 x 2.6 cm. There is trace residual peritoneal fluid collection containing a few tiny foci of gas extending leftward from the fistula site along the anterior abdominal wall, difficult to measure but definitively smaller than comparison. New abdominal fluid collections. No new hernia or ascites. Musculoskeletal: No acute or significant osseous findings. IMPRESSION: 1. Interval decreased size of previously visualized intra-abdominal fluid collections. No new intra-abdominal fluid collections. Neither residual fluid collection is large enough to consider percutaneous drainage. 2. Persistent periumbilical enterocutaneous fistula. 3. Continued enlargement of pulmonary metastatic lesions in the right lower lobe and lingula. Ruthann Cancer, MD [facility] Electronically Signed   By: Ruthann Cancer M.D.   On: 07/16/2021 13:12   ASSESSMENT AND PLAN:  TREI SCHOCH is a 80 y.o. 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 Patient presented secondary to [place] pouch leaking/complications from enterocutaneous fistula. Patient found to have multiple abdominal fluid collections concerning for abscesses and decision initially was to treat conservatively.    Intra-abdominal abscesses CT abd form 8/17 --Three larger abscess with multiple small abscesses seen on CT scan from 8/17. Patient has been on antibiotics for UTI. Recently worsened leukocytosis possibly related to abscesses but unsure.  -Repeat CT abdomen/pelvis with contrast today -infectious disease consults since patient with elevated WBC for help with abxs -Currently on IV zosyn --9/6--CT abdomen with  contrast shows  Interval decreased size of previously visualized intra-abdominal fluid collections. No new intra-abdominal fluid collections. Neither residual fluid collection is large enough to consider percutaneous drainage. 2. Persistent periumbilical enterocutaneous fistula. 3. Continued enlargement of pulmonary metastatic lesions in the right lower lobe and lingula.   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 -WOC for ostomy care   Moderate protein calorie nutrition --Patient presented on TPN. Attempt to wean TPN off and to manage nutritional needs via PO intake.  --TPN weaned down to 50%. Do not think goal will be to wean TPN at this time since patient plans on discharging home when stable for discharge. -Continue TPN per pharmacy -Dietitian recommendations appreciated   Acute metabolic encephalopathy Possibly related to UTI infection. Appears to have resolved.   Urinary tract infection Urine culture (8/26) significant for E. Coli, resistant to ampicillin/ceftriaxone. Patient treated with 7 days of ciprofloxacin IV.   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 [name]. Currently on chemotherapy. Pt has been seen by [facility]   Hyponatremia Mild. Asymptomatic. Thought to be secondary to poor oral intake.   Primary hypertension -Continue losartan   Diabetes mellitus type 2 Polyneuropathy -Continue Semglee, SSI, Lyrica       DVT prophylaxis: SCDs Code Status:   Code Status: Full Code Family Communication: none today Disposition Plan: Discharge home. Family declining home health. Unable to find a SNF that will accept TPN. Discharge not likely for few days and will be pending management recommendations for abdominal abscesses and safe home discharge plan.    Will touch base with ID, Palliative care and family regarding GOC and d/c planning in am    TOTAL TIME TAKING CARE OF THIS PATIENT: 25 minutes.  >50% time spent  on counselling and coordination of care  Note: This dictation was prepared with Dragon dictation along with smaller phrase technology.  Any transcriptional errors that result from this process are unintentional.  Fritzi Mandes M.D    [facility]   CC: Primary care physician; [name], MD Patient ID: [name], male   DOB: July 06, 1941, 80 y.o.   MRN: [id number]

## 2021-07-16 NOTE — Progress Notes (Signed)
Physical Therapy Treatment Patient Details Name: Miguel Flores MRN: [id number] DOB: [birth year] Today's Date: 07/16/2021    History of Present Illness 80 y.o. 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 [name] who presented with draining enterocutaneous fistula.     Patient has a complicated recent abdominal surgery history (see Dr. Deniece Ree note from 8/17) that began 6 weeks ago with cholecystitis at a hospital in [facility]  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  He ultimately went home, initially doing well and then started having issues with the ileostomy.    PT Comments    Pt was pleasant and motivated to participate during the session and overall performed well during the session.  Pt educated on log roll technique with bed mobility and was able to come to sitting at the EOB without physical assist. Pt was steady with transfers and gait and was able to amb 125' with no adverse symptoms. Pt progressing well towards goals with discharge recommendation upgraded to HHPT to safely address deficits listed in patient problem list for decreased caregiver assistance and eventual return to PLOF.     Follow Up Recommendations  Home health PT;Supervision/Assistance - 24 hour     Equipment Recommendations  Rolling walker with 5" wheels;3in1 (PT)    Recommendations for Other Services       Precautions / Restrictions Precautions Precautions: Fall Precaution Comments: ostomy (plus needs to wear ostomy belt) and IV's; L chest port; L midline Restrictions Weight Bearing Restrictions: No    Mobility  Bed Mobility Overal bed mobility: Needs Assistance Bed Mobility: Supine to Sit     Supine to sit: Supervision     General bed mobility comments: Log roll training provided with pt  able to roll and go from sidelying to sit with cues for sequencing but no physical assist needed    Transfers Overall transfer level: Needs assistance Equipment used: Rolling walker (2 wheeled) Transfers: Sit to/from Stand Sit to Stand: Min guard         General transfer comment: Fair to good eccentric and concentric control and stability with min verbal cues for hand placement  Ambulation/Gait Ambulation/Gait assistance: Min guard Gait Distance (Feet): 125 Feet Assistive device: Rolling walker (2 wheeled) Gait Pattern/deviations: Step-through pattern;Decreased step length - right;Decreased step length - left Gait velocity: decreased   General Gait Details: Slow cadence but steady without LOB   Stairs             Wheelchair Mobility    Modified Rankin (Stroke Patients Only)       Balance Overall balance assessment: Needs assistance   Sitting balance-Leahy Scale: Good     Standing balance support: Bilateral upper extremity supported;During functional activity Standing balance-Leahy Scale: Good Standing balance comment: Min lean on the RW for support                            Cognition Arousal/Alertness: Awake/alert Behavior During Therapy: Flat affect Overall Cognitive Status: No family/caregiver present to determine baseline cognitive functioning                                        Exercises Other  Exercises Other Exercises: Log roll training Other Exercises: Sit to/from stand training from multiple height surfaces    General Comments        Pertinent Vitals/Pain Pain Assessment: No/denies pain    Home Living                      Prior Function            PT Goals (current goals can now be found in the care plan section) Progress towards PT goals: Progressing toward goals    Frequency    Min 2X/week      PT Plan Discharge plan needs to be updated    Co-evaluation              AM-PAC  PT "6 Clicks" Mobility   Outcome Measure  Help needed turning from your back to your side while in a flat bed without using bedrails?: A Little Help needed moving from lying on your back to sitting on the side of a flat bed without using bedrails?: A Little Help needed moving to and from a bed to a chair (including a wheelchair)?: A Little Help needed standing up from a chair using your arms (e.g., wheelchair or bedside chair)?: A Little Help needed to walk in hospital room?: A Little Help needed climbing 3-5 steps with a railing? : A Little 6 Click Score: 18    End of Session Equipment Utilized During Treatment: Gait belt Activity Tolerance: Patient tolerated treatment well Patient left: in chair;with call bell/phone within reach;with chair alarm set Nurse Communication: Mobility status PT Visit Diagnosis: Muscle weakness (generalized) (M62.81);Difficulty in walking, not elsewhere classified (R26.2)     Time: 7014-1030 PT Time Calculation (min) (ACUTE ONLY): 26 min  Charges:  $Gait Training: 8-22 mins $Therapeutic Activity: 8-22 mins                     D. Scott Raihana Balderrama PT, DPT 07/16/21, 4:03 PM

## 2021-07-17 LAB — GLUCOSE, CAPILLARY
Glucose-Capillary: 142 mg/dL — ABNORMAL HIGH (ref 70–99)
Glucose-Capillary: 154 mg/dL — ABNORMAL HIGH (ref 70–99)
Glucose-Capillary: 176 mg/dL — ABNORMAL HIGH (ref 70–99)
Glucose-Capillary: 225 mg/dL — ABNORMAL HIGH (ref 70–99)
Glucose-Capillary: 306 mg/dL — ABNORMAL HIGH (ref 70–99)
Glucose-Capillary: 367 mg/dL — ABNORMAL HIGH (ref 70–99)
Glucose-Capillary: 368 mg/dL — ABNORMAL HIGH (ref 70–99)

## 2021-07-17 MED ORDER — METRONIDAZOLE 500 MG PO TABS
500.0000 mg | ORAL_TABLET | Freq: Two times a day (BID) | ORAL | Status: AC
  Administered 2021-07-17 – 2021-07-23 (×14): 500 mg via ORAL
  Filled 2021-07-17 (×14): qty 1

## 2021-07-17 MED ORDER — SULFAMETHOXAZOLE-TRIMETHOPRIM 800-160 MG PO TABS
1.0000 | ORAL_TABLET | Freq: Two times a day (BID) | ORAL | Status: AC
  Administered 2021-07-17 – 2021-07-23 (×14): 1 via ORAL
  Filled 2021-07-17 (×14): qty 1

## 2021-07-17 MED ORDER — AMOXICILLIN-POT CLAVULANATE 875-125 MG PO TABS: 1.0000 | ORAL_TABLET | Freq: Two times a day (BID) | ORAL | Status: DC

## 2021-07-17 MED ORDER — PSYLLIUM 95 % PO PACK
1.0000 | PACK | Freq: Every day | ORAL | Status: DC
  Administered 2021-07-17 – 2021-07-25 (×9): 1 via ORAL
  Filled 2021-07-17 (×9): qty 1

## 2021-07-17 MED ORDER — TRAVASOL 10 % IV SOLN
INTRAVENOUS | Status: AC
  Filled 2021-07-17: qty 1068.5

## 2021-07-17 MED ORDER — LOPERAMIDE HCL 2 MG PO CAPS
2.0000 mg | ORAL_CAPSULE | Freq: Two times a day (BID) | ORAL | Status: DC
  Administered 2021-07-17 – 2021-07-18 (×3): 2 mg via ORAL
  Filled 2021-07-17 (×3): qty 1

## 2021-07-17 NOTE — Plan of Care (Signed)
  Problem: Education: Goal: Knowledge of General Education information will improve Description: Including pain rating scale, medication(s)/side effects and non-pharmacologic comfort measures 07/17/2021 0122 by Jordan Likes, RN Outcome: Not Progressing 07/17/2021 0121 by Jordan Likes, RN Outcome: Progressing   Problem: Health Behavior/Discharge Planning: Goal: Ability to manage health-related needs will improve 07/17/2021 0122 by Jordan Likes, RN Outcome: Not Progressing 07/17/2021 0121 by Jordan Likes, RN Outcome: Progressing   Problem: Clinical Measurements: Goal: Ability to maintain clinical measurements within normal limits will improve Outcome: Progressing Goal: Will remain free from infection 07/17/2021 0122 by Jordan Likes, RN Outcome: Progressing 07/17/2021 0121 by Jordan Likes, RN Outcome: Progressing Goal: Diagnostic test results will improve 07/17/2021 0122 by Jordan Likes, RN Outcome: Progressing 07/17/2021 0121 by Jordan Likes, RN Outcome: Progressing Goal: Respiratory complications will improve 07/17/2021 0122 by Jordan Likes, RN Outcome: Progressing 07/17/2021 0121 by Jordan Likes, RN Outcome: Progressing Goal: Cardiovascular complication will be avoided 07/17/2021 0122 by Jordan Likes, RN Outcome: Progressing 07/17/2021 0121 by Jordan Likes, RN Outcome: Progressing   Problem: Activity: Goal: Risk for activity intolerance will decrease 07/17/2021 0122 by Jordan Likes, RN Outcome: Progressing 07/17/2021 0121 by Jordan Likes, RN Outcome: Progressing   Problem: Nutrition: Goal: Adequate nutrition will be maintained 07/17/2021 0122 by Jordan Likes, RN Outcome: Progressing 07/17/2021 0121 by Jordan Likes, RN Outcome: Progressing   Problem: Coping: Goal: Level of anxiety will decrease 07/17/2021 0122 by Jordan Likes, RN Outcome: Progressing 07/17/2021 0121 by Jordan Likes,  RN Outcome: Progressing   Problem: Elimination: Goal: Will not experience complications related to bowel motility 07/17/2021 0122 by Jordan Likes, RN Outcome: Progressing 07/17/2021 0121 by Jordan Likes, RN Outcome: Progressing Goal: Will not experience complications related to urinary retention 07/17/2021 0122 by Jordan Likes, RN Outcome: Progressing 07/17/2021 0121 by Jordan Likes, RN Outcome: Progressing   Problem: Pain Managment: Goal: General experience of comfort will improve 07/17/2021 0122 by Jordan Likes, RN Outcome: Progressing 07/17/2021 0121 by Jordan Likes, RN Outcome: Progressing   Problem: Safety: Goal: Ability to remain free from injury will improve 07/17/2021 0122 by Jordan Likes, RN Outcome: Progressing 07/17/2021 0121 by Jordan Likes, RN Outcome: Progressing   Problem: Skin Integrity: Goal: Risk for impaired skin integrity will decrease 07/17/2021 0122 by Jordan Likes, RN Outcome: Progressing 07/17/2021 0121 by Jordan Likes, RN Outcome: Progressing

## 2021-07-17 NOTE — Progress Notes (Signed)
Occupational Therapy Treatment Patient Details Name: Miguel Flores MRN: [id number] DOB: 1941/03/06 Today's Date: 07/17/2021    History of present illness 80 y.o. 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 Janese Flores who presented with draining enterocutaneous fistula.     Patient has a complicated recent abdominal surgery history (see Dr. Deniece Flores note from 8/17) that began 6 weeks ago with cholecystitis at a hospital in [facility].  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  He ultimately went home, initially doing well and then started having issues with the ileostomy.   OT comments  Miguel Flores seen for OT tx session this date. He is received semi-supine in bed with sister present at bedside. Pt oriented to self and limited situation. Pt agreeable to OT tx session and denies pain this date. OT facilitates functional ADL tasks including standing grooming at sink and functional mobility as described below. (See ADL section for additional detail). Pt is able to perform all tasks with SBA for management of lines and leads, but no physical assist required during ADL management. Pt continues to be functionally limited by decreased activity tolerance, decreased safety awareness, and generalized weakness. He is progressing toward OT goals and continues to benefit from skilled OT services. Will continue to follow POC. DC recommendation updated to [facility] given pt functional progress.     Follow Up Recommendations  HHOT; Supervision/assistance - 24 hour.     Equipment Recommendations  3 in 1 bedside commode    Recommendations for Other Services      Precautions / Restrictions Precautions Precautions: Fall Precaution Comments: ostomy (plus needs to wear ostomy belt) and IV's; L chest port; L  midline Restrictions Weight Bearing Restrictions: No       Mobility Bed Mobility Overal bed mobility: Needs Assistance Bed Mobility: Supine to Sit     Supine to sit: Supervision;HOB elevated Sit to supine: Supervision        Transfers Overall transfer level: Needs assistance Equipment used: Rolling walker (2 wheeled) Transfers: Sit to/from [facility] Sit to Stand: Min guard Stand pivot transfers: Min guard       General transfer comment: SBA provided during functional mobility for mgt of lines/leads.    Balance Overall balance assessment: Needs assistance Sitting-balance support: Feet supported Sitting balance-Leahy Scale: Good Sitting balance - Comments: steady sitting reaching within BOS   Standing balance support: During functional activity;Single extremity supported;No upper extremity supported Standing balance-Leahy Scale: Good Standing balance comment: Briefly able to stand without UE support during functional tasks. Generally maintains at least 1 UE support for standing balance.                           ADL either performed or assessed with clinical judgement   ADL Overall ADL's : Needs assistance/impaired                                       General ADL Comments: Pt performs standing grooming tasks at sink with supervision for safety. He is able to complete oral care and face washing standing for ~5 min before fatiguing and requiring rest break. Afterward, pt is eager to engage in functional  mobility he is able to walk ~ 50 feet (household distance) using RW with close supervision +SBA for mgt of lines/leads. Pt educated on importance of functional mobility during hospital stay.. Pt and caregiver (sister at bedside) in agreement with plan for pt to use [facility] or urinal rather than use pure wick external catheter as motivation for additional functional mobility on a daily basis. RN notified and in agreement with plan. BSC  set up, pt and caregiver aware to use call bell for pt toileting needs.     Vision Patient Visual Report: No change from baseline     Perception     Praxis      Cognition Arousal/Alertness: Awake/alert Behavior During Therapy: WFL for tasks assessed/performed Overall Cognitive Status: Within Functional Limits for tasks assessed                                 General Comments: Pt is pleasant and conversational t/o session. He is oriented to self and limited situation. He is able to follow multi-step VCs with occasional prompting.        Exercises Other Exercises Other Exercises: OT facilitates bed mobility, standing grooming tasks,&  functional transfers with education of safe transfer techniques, safe use of AE/DME and falls prevention t/o session. Pt and caregiver also educated on strategies to promote functional mobility while acutely hospitalized including use of BSC for toileting (urine) rather than reliance on external catheter.   Shoulder Instructions       General Comments Ostomy/ports intact at start/end of session.    Pertinent Vitals/ Pain       Pain Assessment: No/denies pain Pain Intervention(s): Monitored during session  Home Living                                          Prior Functioning/Environment              Frequency  Min 1X/week        Progress Toward Goals  OT Goals(current goals can now be found in the care plan section)  Progress towards OT goals: Progressing toward goals  Acute Rehab OT Goals Patient Stated Goal: to improve strength and mobility OT Goal Formulation: With patient/family Time For Goal Achievement: 07/19/21 Potential to Achieve Goals: Good  Plan Discharge plan remains appropriate;Frequency remains appropriate    Co-evaluation                 AM-PAC OT "6 Clicks" Daily Activity     Outcome Measure   Help from another person eating meals?: A Little Help from another  person taking care of personal grooming?: A Little Help from another person toileting, which includes using toliet, bedpan, or urinal?: A Little Help from another person bathing (including washing, rinsing, drying)?: A Lot Help from another person to put on and taking off regular upper body clothing?: A Little Help from another person to put on and taking off regular lower body clothing?: A Lot 6 Click Score: 16    End of Session Equipment Utilized During Treatment: Gait belt;Rolling walker  OT Visit Diagnosis: Other abnormalities of gait and mobility (R26.89);Muscle weakness (generalized) (M62.81)   Activity Tolerance Patient tolerated treatment well   Patient Left in chair;with call bell/phone within reach;with chair alarm set   Nurse Communication Mobility status;Other (comment) (Pt able to use  BSC or urinal for urine needs. Should trial being off of external cath.)        Time: 1411-1451 OT Time Calculation (min): 40 min  Charges: OT General Charges $OT Visit: 1 Visit OT Treatments $Self Care/Home Management : 23-37 mins $Therapeutic Activity: 8-22 mins  Shara Blazing, M.S., OTR/L Ascom: [phone] 07/17/21, 3:38 PM

## 2021-07-17 NOTE — Consult Note (Signed)
PHARMACY - TOTAL PARENTERAL NUTRITION CONSULT NOTE   Indication: Fistula  Patient Measurements: Height: 5' 5.98" (167.6 cm) Weight: 68.1 kg (150 lb 2.1 oz) IBW/kg (Calculated) : 63.76 TPN AdjBW (KG): 74.4 Body mass index is 24.24 kg/m.  Assessment:  Patient is an 80 y/o M with medical history including diabetes, HTN, colon cancer with lung metastases, recent history of admission at OSH for cholecystitis s/p cholecystectomy c/b post-op sepsis. Patient underwent ExLap with findings of small bowel injury which was repaired. 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 He is now presenting to the hospital for ileostomy leakage. Imaging notable for multiple intra-abdominal abscess with contrast extravasation to midline of abdominal wound. General surgery is following.   Pharmacy consulted to continue home TPN while patient is admitted.  Glucose / Insulin: History of controlled diabetes (last Hgb A1c 6.9%).  BG 154 - 328 Novolog  SSI q4  +  12 units BID glargine  Electrolytes: Hyponatremia (129), Potassium 3.6  (pt on lasix 20mg  po daily) Renal: SCr stable 0.73 Hepatic: AST 37/ALT 53   TG 53 Intake / Output; MIVF:   GI Imaging: 8/17 CT abdomen / pelvis: Large ventral wound containing small amount of contrast. Enterocutaneous fistula. Multiple intra-abdominal abscess(s). Mesenteric inflammation. Small bilateral pleural effusions with atelectasis or pneumonia at the bases. Metastatic lung nodules, slight increased size of right lower lobe metastatic nodule  GI Surgeries / Procedures:  History of recent cholecystectomy, ExLap at OSH in 05/2021  Central access: Port (see note above) TPN start date: 8/18   Outpatient TPN information: [facility] in [place], [place] Ph: [phone]  Nutritional Goals: Goal TPN rate 84 mL/hr (provides 106.9 g of protein and 2264 kcals per day)   RD Assessment: Estimated  Needs Total Energy Estimated Needs: 2000-2300kcal/day Total Protein Estimated Needs: 100-115g/day Total Fluid Estimated Needs: 2.0-2.3L/day  Current Nutrition:  TPN dysphagia 3 diet, Ensure TID  Plan:  increase TPN back to full rate- 84 mL/hr (total volume w/ overfill 2116 mL)  Nutritional components: Amino acids (Travasol 10%): 106.9 grams Lipids (20% SMOF): 70.6 grams Dextrose: 332.6 grams kCal: 2264 / 24h Electrolytes in TPN: Na 85 mEq/L (increased from 80 to 85 meq/L on 9/5), KCl 15 mEq/L, Ca 5 mEq/L, Mg 9mEq/L, and Phos 70mmol/L. Cl:Ac 1:1 Add standard MVI and trace elements to TPN Patient on dexamethasone daily. Continue Symglee( insulin glargine) 12 units BID and + SSI moderate  Monitor TPN labs on Mon/Thurs, daily until stable   Dallie Piles, PharmD Clinical Pharmacist 07/17/2021 7:13 AM

## 2021-07-17 NOTE — Consult Note (Addendum)
WOC Nurse Consult Note: Refer to previous [place] progress notes.  Requested by primary team to discuss/demonstrate pouch change for abd fistula site to patient and son at the bedside.  Current pouch is intact with good seal, large amt watery green liquid.  Emptied 500 cc liquid from pouch which was over-filled.    Discussed emptying routines to avoid overfilling of pouch and leakage: Pt previously was using a high output pouch attached to a bedside drainage bag when he was at home, and this is a viable option when he is discharged, unless stool becomes thicker.    Offered to remove current pouch and demonstrate pouch change procedure and steps, but son was able to correctly articulate each step and the rationale and current pouch is intact with a good seal, so he declined to have it removed.   Discussed each step of the pouch removal, skin cleaning, and pouching system application.  Wash with water only. Apply 2 layers of stoma powder and skin barrier film to the reddened areas impacted by the effluent, allowing each layer to dry. Use two barrier rings (concentrically) at the inferior border of the wound, and one at the top. Cut the skin barrier opening at 1 3/4 inches, round. Place the skin barrier over the barrier rings. Reinforce 2 borders of the skin barrier tape border with wide silk tape. Attach the pouch. Apply the ostomy belt at the snuggest width the belt would allow.   Extra supplies are at the bedside.  Discussed use of a wider stoma belt after discharge to hold pouch snugly in place and possible Intranet sites provided.    Discussed use of a flexible convex one pouch with belt loops instead of a 2 piece if the ring appears to be too rigid to allow the patient to bend and be more active after discharge. [facility] order number provided. Son was well-informed and denied further questions after the discussion. He states they plan to have home health assistance and discussed ordering  supplies.    Instructions have been provided for bedside nurses to perform a pouch change when the pouch is leaking. 45 min spent on this consult.  Please re-consult if further assistance is needed.  Thank-you,  Julien Girt MSN, [place], [place], [place], [place]

## 2021-07-17 NOTE — Plan of Care (Signed)

## 2021-07-17 NOTE — TOC Progression Note (Signed)
Transition of Care [facility]) - Progression Note    Patient Details  Name: Miguel Flores MRN: [id number] Date of Birth: 08-Jun-1941  [facility] CM/SW Contact  Beverly Sessions, RN Phone Number: 07/17/2021, 2:45 PM  Clinical Narrative:      Notified that plan is for patient to discharge with TPN.  Reported that son [name] requests for continuation of services through [facility] with son Jennye Moccasin with [facility] also presented.  Benjamine Mola states that earliest they would be able to staff 24/7 care would the beginning of next week [name] confirms wishes continuation of services through [facility] and [facility] with Tammy at [facility] [phone].  Clinical faxed to her at [phone] She states she will need additional clinical prior to discharge.  Their dietician will fax an order to [facility] for the MD to be signed.  She states they prefer the orders to be signed 24 hours before discharge, but the latest it can be signed is 11 am day of discharge   Spoke with [facility].  They are unable to accept the patient back. After next week they will not have a nurse on staff  Son [name] notified.  He is agreeable to search for another home health agency.   Per [facility]my with [facility], if it helps their RN can manage the port, TPN and lab draws   - Tommi Rumps with [facility] checking to see if they could accept  - [facility] not able to accept.  Not in network - [facility] awaiting response - [facility] awaiting response - [facility] - awaiting resposne - [facility] awaiting resposne Expected Discharge Plan:  (TBD) Barriers to Discharge: Continued Medical Work up  Expected Discharge Plan and Services Expected Discharge Plan:  (TBD)     Post Acute Care Choice:  (TBD) Living arrangements for the past 2 months: Single Family Home                                       Social Determinants of Health (SDOH)  Interventions    Readmission Risk Interventions No flowsheet data found.

## 2021-07-17 NOTE — Progress Notes (Signed)
Through the night nurse tech entered room and patient had pulled his ostomy bag off and had gotten stool all over himself and the bed. Nurse had to replace bag x2 due to bag continuing to leak.

## 2021-07-17 NOTE — Consult Note (Signed)
NAME: Miguel Flores  DOB: 17-Dec-1940  MRN: [id number]  Date/Time: 07/17/2021 10:10 AM  REQUESTING PROVIDER: Dr.Nettey Subjective:  REASON FOR CONSULT:intraabdominal abscess Pt seen on 07/16/21 but delayed noted entry ?pt is a poor historian, chart reviewed,  Miguel Flores is a 80 y.o. male with complicated medical history including metastatic colon cancer with lung mets S/p chemo presented on 06/26/21 with a leaking ostomy bag which was covering the enterocutaneous fistula. He ws recently hospitalized in [place] for a prolonged period for acute cholecystitis and underwent  cholecystectomy complicated by intestinal perforation leading to mulitple surgeries in July 2022 in [place]. Eventually he developed ECF at the site of the abdominal surgical wound  and was placed on PICC for TPN- was discharged on 06/22/21 to [facility] CT abdomen done on 8/17   showed Multiple mildly rim enhancing intra-abdominal fluid collections. 5 x 2.6 cm right mid abdominal collection, series 2, image 44. 2.4 x 1.8 cm fluid collection in the right mid lateral abdomen adjacent to small bowel, series 2, image 47 5.2 x 1.3 cm lenticular shaped collection within the left mid abdomen, coronal series 6, image 32. Multiple additional smaller rim enhancing collections within the abdomen concerning for abscess. Small rim enhancing perihepatic and perisplenic fluid collections. As he was afebrile with normal WBC, no sntibiotic was started. He was having delirium and confusion a few days later and he was started on ceftriaxone for possible UTI on 8/27- Urine sent on 8/26 had no wbc but grew e.coli which was resistant to ceftriaxone.He was changed to cipro on 8/29 and has been on cipro since then. Because of wbc > 20 flagyl was started on 07/13/21.  He has been seen by surgeon and no surgical remedy is deemed possible currently I am asked to see him for antibiotic recommendation Pt has pulled his PICC out and the port is being  used for TPN  Past Medical History:  Diagnosis Date   Cancer ([place])    skin cancer-nose w graft,also shoulder- basal cell per pt   Cataract    r eye   Chronic kidney disease    kidney stones 20 y ago per pt   Colon cancer ([place]) 2018   Surgical resection and chemo tx's.   Diabetes mellitus without complication ([place])    Eczema    History of kidney stones    Hyperlipidemia    Hypertension    neuropathy from chemo    Vertigo     Past Surgical History:  Procedure Laterality Date   CATARACT EXTRACTION Left    COLECTOMY WITH COLOSTOMY CREATION/HARTMANN PROCEDURE N/A 02/06/2017   Procedure: COLECTOMY WITH COLOSTOMY CREATION/HARTMANN PROCEDURE;  Surgeon: [name], MD;  Location: [facility] ORS;  Service: General;  Laterality: N/A;   COLON SURGERY     COLONOSCOPY WITH PROPOFOL N/A 09/04/2017   Procedure: COLONOSCOPY WITH PROPOFOL;  Surgeon: Jonathon Bellows, MD;  Location: [facility] ENDOSCOPY;  Service: Gastroenterology;  Laterality: N/A;   COLOSTOMY CLOSURE N/A 10/20/2017   Procedure: COLOSTOMY CLOSURE;  Surgeon: [name], MD;  Location: [facility] ORS;  Service: General;  Laterality: N/A;   CYSTOSCOPY     FLEXIBLE SIGMOIDOSCOPY N/A 02/06/2017   Procedure: FLEXIBLE SIGMOIDOSCOPY;  Surgeon: Christene Lye, MD;  Location: [facility] ENDOSCOPY;  Service: Endoscopy;  Laterality: N/A;   PORTACATH PLACEMENT N/A 06/17/2018   Procedure: INSERTION PORT-A-CATH, WITH FLUOROSCOPY;  Surgeon: [name], MD;  Location: [facility] ORS;  Service: General;  Laterality: N/A;    Social History  Socioeconomic History   Marital status: Married    Spouse name: Not on file   Number of children: Not on file   Years of education: Not on file   Highest education level: Not on file  Occupational History   Not on file  Tobacco Use   Smoking status: Never   Smokeless tobacco: Never  Vaping Use   Vaping Use: Never used  Substance and Sexual Activity   Alcohol use: No   Drug use: No   Sexual activity: Not  Currently  Other Topics Concern   Not on file  Social History Narrative   Not on file   Social Determinants of Health   Financial Resource Strain: Not on file  Food Insecurity: Not on file  Transportation Needs: Not on file  Physical Activity: Not on file  Stress: Not on file  Social Connections: Not on file  Intimate Partner Violence: Not on file    Family History  Problem Relation Age of Onset   Diabetes Mother    AAA (abdominal aortic aneurysm) Mother    Coronary artery disease Mother    Diabetes Father    Coronary artery disease Father    Dementia Father    Breast cancer Sister    No Known Allergies I? Current Facility-Administered Medications  Medication Dose Route Frequency Provider Last Rate Last Admin   0.9 %  sodium chloride infusion   Intravenous PRN Val Riles, MD 10 mL/hr at 07/15/21 2104 250 mL at 07/15/21 2104   acetaminophen (TYLENOL) tablet 650 mg  650 mg Oral Q6H PRN Athena Masse, MD   650 mg at 07/05/21 2141   Or   acetaminophen (TYLENOL) suppository 650 mg  650 mg Rectal Q6H PRN Athena Masse, MD       aspirin EC tablet 81 mg  81 mg Oral Daily Edwin Dada, MD   81 mg at 07/17/21 4627   benzonatate (TESSALON) capsule 200 mg  200 mg Oral TID Loletha Grayer, MD   200 mg at 07/17/21 0350   Chlorhexidine Gluconate Cloth 2 % PADS 6 each  6 each Topical Daily Athena Masse, MD   6 each at 07/17/21 0853   dexamethasone (DECADRON) tablet 4 mg  4 mg Oral Daily Ralene Muskrat B, MD   4 mg at 07/17/21 0852   dronabinol (MARINOL) capsule 5 mg  5 mg Oral BID AC Sreenath, Sudheer B, MD   5 mg at 07/16/21 1658   feeding supplement (ENSURE ENLIVE / ENSURE PLUS) liquid 237 mL  237 mL Oral TID BM Ralene Muskrat B, MD   237 mL at 07/17/21 0855   furosemide (LASIX) tablet 20 mg  20 mg Oral Daily Loletha Grayer, MD   20 mg at 07/17/21 0850   guaiFENesin-dextromethorphan (ROBITUSSIN DM) 100-10 MG/5ML syrup 5 mL  5 mL Oral Q4H PRN Athena Masse, MD    5 mL at 06/30/21 2144   insulin aspart (novoLOG) injection 0-15 Units  0-15 Units Subcutaneous Q4H Athena Masse, MD   3 Units at 07/17/21 0852   insulin glargine-yfgn (SEMGLEE) injection 12 Units  12 Units Subcutaneous BID Ralene Muskrat B, MD   12 Units at 07/17/21 0853   iohexol (OMNIPAQUE) 9 MG/ML oral solution 1,000 mL  1,000 mL Oral Once PRN Mariel Aloe, MD       loperamide (IMODIUM) capsule 2 mg  2 mg Oral BID Fritzi Mandes, MD       loratadine (CLARITIN) tablet 10 mg  10 mg Oral Daily Judd Gaudier V, MD   10 mg at 07/17/21 0850   losartan (COZAAR) tablet 25 mg  25 mg Oral Daily Athena Masse, MD   25 mg at 07/17/21 0851   metroNIDAZOLE (FLAGYL) tablet 500 mg  500 mg Oral Q12H Fritzi Mandes, MD       mirtazapine (REMERON) tablet 15 mg  15 mg Oral QHS Priscella Mann, Sudheer B, MD   15 mg at 07/16/21 2131   ondansetron (ZOFRAN) tablet 4 mg  4 mg Oral Q6H PRN Athena Masse, MD       Or   ondansetron [facility]) injection 4 mg  4 mg Intravenous Q6H PRN Athena Masse, MD   4 mg at 06/30/21 0545   pantoprazole (PROTONIX) EC tablet 40 mg  40 mg Oral Daily Val Riles, MD   40 mg at 07/17/21 0850   pregabalin (LYRICA) capsule 75 mg  75 mg Oral BID Athena Masse, MD   75 mg at 07/17/21 0851   psyllium (HYDROCIL/METAMUCIL) 1 packet  1 packet Oral Daily Fritzi Mandes, MD       sodium chloride (OCEAN) 0.65 % nasal spray 1 spray  1 spray Each Nare PRN Val Riles, MD       sodium chloride flush (NS) 0.9 % injection 10-40 mL  10-40 mL Intracatheter Q12H Wieting, Richard, MD   10 mL at 07/17/21 0851   sodium chloride flush (NS) 0.9 % injection 10-40 mL  10-40 mL Intracatheter PRN Loletha Grayer, MD   10 mL at 07/11/21 2132   sulfamethoxazole-trimethoprim (BACTRIM DS) 800-160 MG per tablet 1 tablet  1 tablet Oral Q12H Fritzi Mandes, MD       TPN ADULT (ION)   Intravenous Continuous TPN Dallie Piles, RPH 42 mL/hr at 07/17/21 0408 Infusion Verify at 07/17/21 0408   TPN ADULT (ION)   Intravenous  Continuous TPN Dallie Piles, RPH       Facility-Administered Medications Ordered in Other Encounters  Medication Dose Route Frequency Provider Last Rate Last Admin   0.9 %  sodium chloride infusion   Intravenous Continuous Sindy Guadeloupe, MD   Stopped at 07/27/18 1037   heparin lock flush 100 unit/mL  500 Units Intravenous Once Sindy Guadeloupe, MD       sodium chloride flush (NS) 0.9 % injection 10 mL  10 mL Intravenous PRN Sindy Guadeloupe, MD   10 mL at 07/27/18 0920   sodium chloride flush (NS) 0.9 % injection 10 mL  10 mL Intravenous Once Sindy Guadeloupe, MD         Abtx:  Anti-infectives (From admission, onward)    Start     Dose/Rate Route Frequency Ordered Stop   07/17/21 1400  amoxicillin-clavulanate (AUGMENTIN) 875-125 MG per tablet 1 tablet  Status:  Discontinued        1 tablet Oral Every 12 hours 07/17/21 0919 07/17/21 0927   07/17/21 1300  sulfamethoxazole-trimethoprim (BACTRIM DS) 800-160 MG per tablet 1 tablet        1 tablet Oral Every 12 hours 07/17/21 0928 07/24/21 0959   07/17/21 1300  metroNIDAZOLE (FLAGYL) tablet 500 mg        500 mg Oral Every 12 hours 07/17/21 0928 07/24/21 0959   07/16/21 0600  piperacillin-tazobactam (ZOSYN) IVPB 3.375 g  Status:  Discontinued        3.375 g 12.5 mL/hr over 240 Minutes Intravenous Every 8 hours 07/15/21 2119 07/17/21 0918   07/15/21 2100  ciprofloxacin (CIPRO) IVPB 400 mg        400 mg 200 mL/hr over 60 Minutes Intravenous Every 12 hours 07/15/21 1948 07/15/21 2359   07/13/21 1200  metroNIDAZOLE (FLAGYL) IVPB 500 mg        500 mg 100 mL/hr over 60 Minutes Intravenous Every 8 hours 07/13/21 0935 07/15/21 2359   07/08/21 1000  ciprofloxacin (CIPRO) IVPB 400 mg        400 mg 200 mL/hr over 60 Minutes Intravenous Every 12 hours 07/08/21 0849 07/14/21 2240   07/06/21 1315  cefTRIAXone (ROCEPHIN) 2 g in sodium chloride 0.9 % 100 mL IVPB  Status:  Discontinued        2 g 200 mL/hr over 30 Minutes Intravenous Every 24 hours 07/06/21  1225 07/08/21 0849       REVIEW OF SYSTEMS:  Pt is a poor historian and has some intermittent confusion Says he has no pain,  Wants me to talk his son  Objective:  VITALS:  BP (!) 105/54 (BP Location: Right Arm)   Pulse 76   Temp (!) 97.5 F (36.4 C) (Oral)   Resp 14   Ht 5' 5.98" (1.676 m)   Wt 68.1 kg   SpO2 100%   BMI 24.24 kg/m  PHYSICAL EXAM:  General: awake Alert, no distress, .  Head: Normocephalic, without obvious abnormality, atraumatic. Eyes: Conjunctivae clear, anicteric sclerae. Pupils are equal ENT Nares normal. No drainage or sinus tenderness. Lips, mucosa, and tongue normal. No Thrush Neck: , symmetrical,  Lungs: B/L air entry Decreased air entry  Heart: s1s2 Port in place Abdomen: Soft, extensive dehiscence of the abdominal wound covered with wound manager filled with stool Extremities: atraumatic, no cyanosis. No edema. No clubbing Skin: No rashes or lesions. Or bruising Lymph: Cervical, supraclavicular normal. Neurologic: did not examine in detail Pertinent Labs Lab Results CBC    Component Value Date/Time   WBC 14.2 (H) 07/16/2021 0542   RBC 3.54 (L) 07/16/2021 0542   HGB 9.3 (L) 07/16/2021 0542   HCT 29.4 (L) 07/16/2021 0542   PLT 292 07/16/2021 0542   MCV 83.1 07/16/2021 0542   MCH 26.3 07/16/2021 0542   MCHC 31.6 07/16/2021 0542   RDW 17.6 (H) 07/16/2021 0542   LYMPHSABS 2.4 07/15/2021 0459   MONOABS 1.3 (H) 07/15/2021 0459   EOSABS 0.0 07/15/2021 0459   BASOSABS 0.0 07/15/2021 0459    CMP Latest Ref Rng & Units 07/15/2021 07/14/2021 07/13/2021  Glucose 70 - 99 mg/dL 126(H) 189(H) 88  BUN 8 - 23 mg/dL 39(H) 47(H) 51(H)  Creatinine 0.61 - 1.24 mg/dL 0.76 0.82 0.82  Sodium 135 - 145 mmol/L 129(L) 128(L) 131(L)  Potassium 3.5 - 5.1 mmol/L 3.6 3.9 4.3  Chloride 98 - 111 mmol/L 103 104 107  CO2 22 - 32 mmol/L 22 18(L) 21(L)  Calcium 8.9 - 10.3 mg/dL 8.2(L) 8.3(L) 8.7(L)  Total Protein 6.5 - 8.1 g/dL 6.1(L) - -  Total Bilirubin 0.3 - 1.2  mg/dL 0.6 - -  Alkaline Phos 38 - 126 U/L 129(H) - -  AST 15 - 41 U/L 35 - -  ALT 0 - 44 U/L 51(H) - -  Micro UC-E.coli  IMAGING RESULTS: Multiple mildly rim enhancing intra-abdominal fluid collections. 5 x 2.6 cm right mid abdominal collection, series 2, image 44. 2.4 x 1.8 cm fluid collection in the right mid lateral abdomen adjacent to small bowel, series 2, image 47 5.2 x 1.3 cm lenticular shaped collection within the left mid abdomen, coronal  series 6, image 32. Multiple additional smaller rim enhancing collections within the abdomen concerning for abscess. Small rim enhancing perihepatic and perisplenic fluid collections. Generalized soft tissue stranding within the mesentery which is new compared to prior. I have personally reviewed the films ? Impression/Recommendation Complicated intraabdominal pathology- with recent cholecystectomy complicated by bowel perforation, multiple surgeries, enterocutaneous fistula,  Now has some small intra- abdominal collections- looks like they may have been present from before-  Pt currently on cipro flagyl. Changed to zosyn on 07/15/21. Repeat Ct abdomen done today and it shows stable to slightly reduced collection Leucocytosis has improved Will send patient on bactrim DS I bid and flagyl 500mg  po BID for 7 days Pt has metastatic colon cancer with lung mets Confusion intermittently- A CT head to look for mets would help to define his prognosis. Overall guarded prognosis. Discussed the management with son in great detail Discussed the management with care team  Note:  This document was prepared using Dragon voice recognition software and may include unintentional dictation errors.

## 2021-07-17 NOTE — Progress Notes (Signed)
Inpatient Diabetes Program Recommendations  AACE/ADA: New Consensus Statement on Inpatient Glycemic Control (2015)  Target Ranges:  Prepandial:   less than 140 mg/dL      Peak postprandial:   less than 180 mg/dL (1-2 hours)      Critically ill patients:  140 - 180 mg/dL   Lab Results  Component Value Date   GLUCAP 367 (H) 07/17/2021   HGBA1C 6.4 (H) 06/26/2021    Review of Glycemic Control Results for Miguel Flores, Miguel Flores (MRN [id number]) as of 07/17/2021 14:22  Ref. Range 07/16/2021 09:22 07/16/2021 12:40 07/16/2021 16:53 07/16/2021 20:06 07/16/2021 23:56 07/17/2021 03:42 07/17/2021 08:16 07/17/2021 11:35  Glucose-Capillary Latest Ref Range: 70 - 99 mg/dL 183 (H) 171 (H) 269 (H) 328 (H) 247 (H) 154 (H) 176 (H) 367 (H)    Inpatient Diabetes Program Recommendations:   Most post prandial CBGs elevated. -Consider add Novolog 4 units tid meal coverage if eats 50%. Secure chat sent to Dr. Posey Pronto.  Thank you, Nani Gasser. Aikeem Lilley, RN, MSN, CDE  Diabetes Coordinator Inpatient Glycemic Control Team Team Pager [phone] (8am-5pm) 07/17/2021 2:23 PM

## 2021-07-17 NOTE — Progress Notes (Signed)
Miguel Flores NAME: Miguel Flores    MR#:  [id number]  DATE OF BIRTH:  1941-07-30  SUBJECTIVE:  patient sitting up in the bed eating magic cup. Son Miguel Flores in the room. Had a long discussion regarding few things patient's son wanted me to address. Patient meditating okay. REVIEW OF SYSTEMS:   Review of Systems  Constitutional:  Negative for chills, fever and weight loss.  HENT:  Negative for ear discharge, ear pain and nosebleeds.   Eyes:  Negative for blurred vision, pain and discharge.  Respiratory:  Negative for sputum production, shortness of breath, wheezing and stridor.   Cardiovascular:  Negative for chest pain, palpitations, orthopnea and PND.  Gastrointestinal:  Negative for abdominal pain, diarrhea, nausea and vomiting.  Genitourinary:  Negative for frequency and urgency.  Musculoskeletal:  Negative for back pain and joint pain.  Neurological:  Positive for weakness. Negative for sensory change, speech change and focal weakness.  Psychiatric/Behavioral:  Negative for depression and hallucinations. The patient is not nervous/anxious.   Tolerating Diet: yes Tolerating PT: HHPT  DRUG ALLERGIES:  No Known Allergies  VITALS:  Blood pressure (!) 105/54, pulse 76, temperature (!) 97.5 F (36.4 C), temperature source Oral, resp. rate 14, height 5' 5.98" (1.676 m), weight 68.1 kg, SpO2 100 %.  PHYSICAL EXAMINATION:   Physical Exam  GENERAL:  80 y.o.-year-old patient lying in the bed with no acute distress.   weak LUNGS: Normal breath sounds bilaterally, no wheezing, rales, rhonchi. No use of accessory muscles of respiration. PORT+ CARDIOVASCULAR: S1, S2 normal. No murmurs, rubs, or gallops.  ABDOMEN: Soft, nontender, nondistended. Ileostomy bag+ EXTREMITIES: No cyanosis, clubbing or edema b/l.    NEUROLOGIC: non focal PSYCHIATRIC:  patient is alert and awake SKIN: No obvious rash, lesion, or ulcer.   LABORATORY PANEL:   CBC Recent Labs  Lab 07/16/21 0542  WBC 14.2*  HGB 9.3*  HCT 29.4*  PLT 292     Chemistries  Recent Labs  Lab 07/15/21 0459  NA 129*  K 3.6  CL 103  CO2 22  GLUCOSE 126*  BUN 39*  CREATININE 0.76  CALCIUM 8.2*  MG 1.9  AST 35  ALT 51*  ALKPHOS 129*  BILITOT 0.6    Cardiac Enzymes No results for input(s): TROPONINI in the last 168 hours. RADIOLOGY:  CT ABDOMEN PELVIS W CONTRAST  Result Date: 07/16/2021 CLINICAL DATA:  80 year old male, abdominal abscess suspected. EXAM: CT ABDOMEN AND PELVIS WITHOUT CONTRAST TECHNIQUE: Multidetector CT imaging of the abdomen and pelvis was performed following the standard protocol without IV contrast. COMPARISON:  06/26/2021, 03/13/2021 FINDINGS: Lower chest: Continued interval enlargement of previously visualized right lower lobe (22 x 18 mm, previously 16 x 14 mm) and lingular (incompletely visualized, measuring up to 14 mm, previously 13 mm) pulmonary metastases. No acute abnormality or new metastatic lesion. Interval resolution of previously visualized bilateral pleural effusions. Hepatobiliary: No focal liver abnormality is seen. Status post cholecystectomy. No biliary dilatation. Pancreas: Unremarkable. No pancreatic ductal dilatation or surrounding inflammatory changes. Spleen: Normal in size without focal abnormality. Adrenals/Urinary Tract: Adrenal glands are unremarkable. Kidneys are normal, without renal calculi, focal lesion, or hydronephrosis. Bladder is unremarkable. Stomach/Bowel: Stomach is within normal limits. Appendix appears normal. Again seen is a midline, periumbilical enteric cutaneous fistula which remains widely patent. Postsurgical changes after descending colon colostomy. No evidence of bowel wall thickening, distention, or inflammatory changes. Vascular/Lymphatic: Aortic atherosclerosis. No enlarged abdominal or pelvic lymph nodes. Decreased  peripherally visualized mesenteric stranding. Reproductive: Prostate  calcifications are again seen. Other: Interval decreased size of previously visualized right mid abdominal mesenteric peripherally enhancing fluid collection now measuring up to 5.1 x 1.2 cm (series 2, image 39), previously approximately 5 point 0 x 2.6 cm. There is trace residual peritoneal fluid collection containing a few tiny foci of gas extending leftward from the fistula site along the anterior abdominal wall, difficult to measure but definitively smaller than comparison. New abdominal fluid collections. No new hernia or ascites. Musculoskeletal: No acute or significant osseous findings. IMPRESSION: 1. Interval decreased size of previously visualized intra-abdominal fluid collections. No new intra-abdominal fluid collections. Neither residual fluid collection is large enough to consider percutaneous drainage. 2. Persistent periumbilical enterocutaneous fistula. 3. Continued enlargement of pulmonary metastatic lesions in the right lower lobe and lingula. Miguel Cancer, MD [facility] Electronically Signed   By: Miguel Flores M.D.   On: 07/16/2021 13:12   ASSESSMENT AND PLAN:  Miguel Flores is a 80 y.o. 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 Patient presented secondary to [place] pouch leaking/complications from enterocutaneous fistula. Patient found to have multiple abdominal fluid collections concerning for abscesses and decision initially was to treat conservatively.    Intra-abdominal abscesses CT abd from 8/17 showed --Three larger abscess with multiple small abscesses seen on CT scan from 8/17. Patient has been on antibiotics for UTI. Recently worsened leukocytosis possibly related to abscesses but unsure.  -Repeat CT abdomen/pelvis with contrast today -infectious disease consults since patient with  elevated WBC for help with abxs -Currently on IV zosyn --9/6--CT abdomen with contrast shows  Interval decreased size of previously visualized intra-abdominal fluid collections. No new intra-abdominal fluid collections. Neither residual fluid collection is large enough to consider percutaneous drainage. 2. Persistent periumbilical enterocutaneous fistula. 3. Continued enlargement of pulmonary metastatic lesions in the right lower lobe and lingula. --9/7-- patient afebrile. White count trending down. Tolerating some PO diet. Continuing IV TPN for now and patient will discharged home with TPN according to son Miguel Flores in the room. -- Patient ambulated with physical therapy 125 feet yesterday. Will try to PT alternating with OT while patient is in-house. -- Wound nurse to see patient regarding issues with pouch leakage and discussed various options with son. -- Per ID Dr. Steva Ready change to oral Bactrim DS BID and Flagyl 500 BID for seven more days -- per Dr. 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 -WOC for ostomy care   Moderate protein calorie nutrition --Patient presented on TPN.  -Continue TPN to manage goal rate since family plans on taking patient home on TPN. -Dietitian recommendations appreciated   Acute metabolic encephalopathy Possibly related to UTI infection. Appears to have resolved.   Urinary tract infection Urine culture (8/26) significant for E. Coli, resistant to ampicillin/ceftriaxone. Patient treated with 7 days of ciprofloxacin IV.   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 [name]-- patient is not a candidate for any further chemo at present per Dr. [name] Pt has been [facility] by [facility] care   Hyponatremia Mild. Asymptomatic. Thought to be secondary to poor oral intake.   Primary hypertension -Continue losartan   Diabetes mellitus type 2 Polyneuropathy -Continue Semglee, SSI,  Lyrica       DVT prophylaxis: SCDs Code Status:   Code Status: Full Code Family Communication: son Miguel Flores in the room  Disposition Plan: Discharge home.   Family plans to take patient home once his 24 hour nursing/caregiver is set up which likely is going to be early of next week. Patient will go home with continuous IV TPN.    TOTAL TIME TAKING CARE OF THIS PATIENT: 25 minutes.  >50% time spent on counselling and coordination of care  Note: This dictation was prepared with Dragon dictation along with smaller phrase technology. Any transcriptional errors that result from this process are unintentional.  Fritzi Mandes M.D    [facility]   CC: Primary care physician; [name], MD Patient ID: [name], male   DOB: 07-01-1941, 80 y.o.   MRN: [id number]

## 2021-07-18 DIAGNOSIS — K9413 Enterostomy malfunction: Secondary | ICD-10-CM | POA: Diagnosis not present

## 2021-07-18 LAB — COMPREHENSIVE METABOLIC PANEL
ALT: 29 U/L (ref 0–44)
AST: 18 U/L (ref 15–41)
Albumin: 2.6 g/dL — ABNORMAL LOW (ref 3.5–5.0)
Alkaline Phosphatase: 107 U/L (ref 38–126)
Anion gap: 4 — ABNORMAL LOW (ref 5–15)
BUN: 42 mg/dL — ABNORMAL HIGH (ref 8–23)
CO2: 21 mmol/L — ABNORMAL LOW (ref 22–32)
Calcium: 8.3 mg/dL — ABNORMAL LOW (ref 8.9–10.3)
Chloride: 107 mmol/L (ref 98–111)
Creatinine, Ser: 0.72 mg/dL (ref 0.61–1.24)
GFR, Estimated: >60 mL/min (ref >60)
Glucose, Bld: 182 mg/dL — ABNORMAL HIGH (ref 70–99)
Potassium: 4 mmol/L (ref 3.5–5.1)
Sodium: 132 mmol/L — ABNORMAL LOW (ref 135–145)
Total Bilirubin: 0.4 mg/dL (ref 0.3–1.2)
Total Protein: 5.7 g/dL — ABNORMAL LOW (ref 6.5–8.1)

## 2021-07-18 LAB — GLUCOSE, CAPILLARY
Glucose-Capillary: 184 mg/dL — ABNORMAL HIGH (ref 70–99)
Glucose-Capillary: 186 mg/dL — ABNORMAL HIGH (ref 70–99)
Glucose-Capillary: 191 mg/dL — ABNORMAL HIGH (ref 70–99)
Glucose-Capillary: 196 mg/dL — ABNORMAL HIGH (ref 70–99)
Glucose-Capillary: 204 mg/dL — ABNORMAL HIGH (ref 70–99)
Glucose-Capillary: 222 mg/dL — ABNORMAL HIGH (ref 70–99)
Glucose-Capillary: 265 mg/dL — ABNORMAL HIGH (ref 70–99)
Glucose-Capillary: 316 mg/dL — ABNORMAL HIGH (ref 70–99)

## 2021-07-18 LAB — PHOSPHORUS: Phosphorus: 2.7 mg/dL (ref 2.5–4.6)

## 2021-07-18 LAB — MAGNESIUM: Magnesium: 2 mg/dL (ref 1.7–2.4)

## 2021-07-18 MED ORDER — TRAVASOL 10 % IV SOLN
INTRAVENOUS | Status: AC
  Filled 2021-07-18: qty 1068.5

## 2021-07-18 MED ORDER — LOPERAMIDE HCL 2 MG PO CAPS
2.0000 mg | ORAL_CAPSULE | Freq: Three times a day (TID) | ORAL | Status: DC
  Administered 2021-07-18 – 2021-07-25 (×20): 2 mg via ORAL
  Filled 2021-07-18 (×21): qty 1

## 2021-07-18 MED ORDER — INSULIN GLARGINE-YFGN 100 UNIT/ML ~~LOC~~ SOLN
15.0000 [IU] | Freq: Two times a day (BID) | SUBCUTANEOUS | Status: DC
  Administered 2021-07-18 – 2021-07-20 (×5): 15 [IU] via SUBCUTANEOUS
  Filled 2021-07-18 (×6): qty 0.15

## 2021-07-18 MED ORDER — INSULIN ASPART 100 UNIT/ML IJ SOLN
4.0000 [IU] | INTRAMUSCULAR | Status: DC
  Administered 2021-07-18 – 2021-07-25 (×35): 4 [IU] via SUBCUTANEOUS
  Filled 2021-07-18 (×34): qty 1

## 2021-07-18 NOTE — TOC Progression Note (Signed)
Transition of Care [facility]) - Progression Note    Patient Details  Name: Miguel Flores MRN: [id number] Date of Birth: [birth year]  Transition of Care [facility]) CM/SW Contact  Beverly Sessions, RN Phone Number: 07/18/2021, 9:42 AM  Clinical Narrative:     - [facility] - unable to accept - [facility]- unable to accept - [facility] - does not have staffing this week, she states TOC can follow up next week to see if staffing available   Expected Discharge Plan:  (TBD) Barriers to Discharge: Continued Medical Work up  Expected Discharge Plan and Services Expected Discharge Plan:  (TBD)     Post Acute Care Choice:  (TBD) Living arrangements for the past 2 months: Single Family Home                                       Social Determinants of Health (SDOH) Interventions    Readmission Risk Interventions No flowsheet data found.

## 2021-07-18 NOTE — TOC Progression Note (Signed)
Transition of Care [facility]) - Progression Note    Patient Details  Name: Miguel Flores MRN: [id number] Date of Birth: May 22, 1941  Transition [facility]) CM/SW Contact  Beverly Sessions, RN Phone Number: 07/18/2021, 2:19 PM  Clinical Narrative:     Miguel Flores with [facility] confirms they can accept patient and will have staffing available next week Notified son Miguel Flores and reviewed Medicare.gov ratings.  He accepts their services  VM left with Miguel Flores at option to notified MD updated  PT recommends RW and BSC. Reported that patient has RW in the home.  TOC to follow up and determine if patient needs [facility]   Expected Discharge Plan:  (TBD) Barriers to Discharge: Continued Medical Work up  Expected Discharge Plan and Services Expected Discharge Plan:  (TBD)     Post Acute Care Choice:  (TBD) Living arrangements for the past 2 months: Single Family Home                                       Social Determinants of Health (SDOH) Interventions    Readmission Risk Interventions No flowsheet data found.

## 2021-07-18 NOTE — Consult Note (Signed)
PHARMACY - TOTAL PARENTERAL NUTRITION CONSULT NOTE   Indication: Fistula  Patient Measurements: Height: 5' 5.98" (167.6 cm) Weight: 66 kg (145 lb 8.1 oz) IBW/kg (Calculated) : 63.76 TPN AdjBW (KG): 74.4 Body mass index is 23.5 kg/m.  Assessment:  Patient is an 80 y/o M with medical history including diabetes, HTN, colon cancer with lung metastases, recent history of admission at OSH for cholecystitis s/p cholecystectomy c/b post-op sepsis. Patient underwent ExLap with findings of small bowel injury which was repaired. 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 He is now presenting to the hospital for ileostomy leakage. Imaging notable for multiple intra-abdominal abscess with contrast extravasation to midline of abdominal wound. General surgery is following.   Pharmacy consulted to continue home TPN while patient is admitted.  Glucose / Insulin: History of controlled diabetes (last Hgb A1c 6.9%).  BG 154 - 328 Novolog  SSI q4  +  12 units BID glargine  Electrolytes: Hyponatremia (129), Potassium 3.6  (pt on lasix 20mg  po daily) Renal: SCr stable 0.73 Hepatic: AST 37/ALT 53   TG 53 Intake / Output; MIVF:   GI Imaging: 8/17 CT abdomen / pelvis: Large ventral wound containing small amount of contrast. Enterocutaneous fistula. Multiple intra-abdominal abscess(s). Mesenteric inflammation. Small bilateral pleural effusions with atelectasis or pneumonia at the bases. Metastatic lung nodules, slight increased size of right lower lobe metastatic nodule  GI Surgeries / Procedures:  History of recent cholecystectomy, ExLap at OSH in 05/2021  Central access: Port (see note above) TPN start date: 8/18   Outpatient TPN information: [facility] in [place], [place] Ph: [phone]  Nutritional Goals: Goal TPN rate 84 mL/hr (provides 106.9 g of protein and 2264 kcals per day)   RD Assessment: Estimated  Needs Total Energy Estimated Needs: 2000-2300kcal/day Total Protein Estimated Needs: 100-115g/day Total Fluid Estimated Needs: 2.0-2.3L/day  Current Nutrition:  TPN dysphagia 3 diet, Ensure TID  Plan:  continue TPN at full rate- 84 mL/hr (total volume w/ overfill 2116 mL)  Nutritional components: Amino acids (Travasol 10%): 106.9 grams Lipids (20% SMOF): 70.6 grams Dextrose: 332.6 grams kCal: 2264 / 24h Electrolytes in TPN: Na 85 mEq/L (increased from 80 to 85 meq/L on 9/5), KCl 15 mEq/L, Ca 5 mEq/L, Mg 51mEq/L, and Phos 75mmol/L. Cl:Ac 1:1 Add standard MVI and trace elements to TPN Patient on dexamethasone daily.  Symglee (insulin glargine) increased to 15 units BID + SSI moderate + 4 units Novolog q4h Monitor TPN labs on Mon/Thurs, daily until stable   Dallie Piles, PharmD Clinical Pharmacist 07/18/2021 7:16 AM

## 2021-07-18 NOTE — Progress Notes (Signed)
Hematology/Oncology Consult note [facility]  [phone] [phone]  Patient Care Team: [name], MD as PCP - General (Family Medicine) Sindy Guadeloupe, MD as Consulting Physician (Hematology and Oncology)   Name of the patient: Miguel Flores  [id number]  [birth year]   Date of visit: 07/18/2021   Interval history- he is more alert and tearful about spending his time in the hospital. He hopes to go home soon. Still requiring TPN to meet his calorie needs. Leakage noted around colostomy bag when he sits up in a chaier  ECOG PS- 3 Pain scale- 0  Review of systems- Review of Systems  Constitutional:  Positive for malaise/fatigue.    No Known Allergies   Past Medical History:  Diagnosis Date   Cancer ([place])    skin cancer-nose w graft,also shoulder- basal cell per pt   Cataract    r eye   Chronic kidney disease    kidney stones 20 y ago per pt   Colon cancer ([place]) 2018   Surgical resection and chemo tx's.   Diabetes mellitus without complication ([place])    Eczema    History of kidney stones    Hyperlipidemia    Hypertension    neuropathy from chemo    Vertigo      Past Surgical History:  Procedure Laterality Date   CATARACT EXTRACTION Left    COLECTOMY WITH COLOSTOMY CREATION/HARTMANN PROCEDURE N/A 02/06/2017   Procedure: COLECTOMY WITH COLOSTOMY CREATION/HARTMANN PROCEDURE;  Surgeon: [name], MD;  Location: [facility] ORS;  Service: General;  Laterality: N/A;   COLON SURGERY     COLONOSCOPY WITH PROPOFOL N/A 09/04/2017   Procedure: COLONOSCOPY WITH PROPOFOL;  Surgeon: Jonathon Bellows, MD;  Location: [facility] ENDOSCOPY;  Service: Gastroenterology;  Laterality: N/A;   COLOSTOMY CLOSURE N/A 10/20/2017   Procedure: COLOSTOMY CLOSURE;  Surgeon: [name], MD;  Location: [facility] ORS;  Service: General;  Laterality: N/A;   CYSTOSCOPY     FLEXIBLE SIGMOIDOSCOPY N/A 02/06/2017   Procedure: FLEXIBLE SIGMOIDOSCOPY;   Surgeon: Christene Lye, MD;  Location: [facility] ENDOSCOPY;  Service: Endoscopy;  Laterality: N/A;   PORTACATH PLACEMENT N/A 06/17/2018   Procedure: INSERTION PORT-A-CATH, WITH FLUOROSCOPY;  Surgeon: [name], MD;  Location: [facility] ORS;  Service: General;  Laterality: N/A;    Social History   Socioeconomic History   Marital status: Married    Spouse name: Not on file   Number of children: Not on file   Years of education: Not on file   Highest education level: Not on file  Occupational History   Not on file  Tobacco Use   Smoking status: Never   Smokeless tobacco: Never  Vaping Use   Vaping Use: Never used  Substance and Sexual Activity   Alcohol use: No   Drug use: No   Sexual activity: Not Currently  Other Topics Concern   Not on file  Social History Narrative   Not on file   Social Determinants of Health   Financial Resource Strain: Not on file  Food Insecurity: Not on file  Transportation Needs: Not on file  Physical Activity: Not on file  Stress: Not on file  Social Connections: Not on file  Intimate Partner Violence: Not on file    Family History  Problem Relation Age of Onset   Diabetes Mother    AAA (abdominal aortic aneurysm) Mother    Coronary artery disease Mother    Diabetes Father    Coronary artery  disease Father    Dementia Father    Breast cancer Sister      Current Facility-Administered Medications:    0.9 %  sodium chloride infusion, , Intravenous, PRN, Val Riles, MD, Last Rate: 10 mL/hr at 07/15/21 2104, 250 mL at 07/15/21 2104   acetaminophen (TYLENOL) tablet 650 mg, 650 mg, Oral, Q6H PRN, 650 mg at 07/05/21 2141 **OR** acetaminophen (TYLENOL) suppository 650 mg, 650 mg, Rectal, Q6H PRN, Athena Masse, MD   aspirin EC tablet 81 mg, 81 mg, Oral, Daily, Danford, Suann Larry, MD, 81 mg at 07/18/21 0913   benzonatate (TESSALON) capsule 200 mg, 200 mg, Oral, TID, Leslye Peer, Richard, MD, 200 mg at 07/18/21 1641   Chlorhexidine  Gluconate Cloth 2 % PADS 6 each, 6 each, Topical, Daily, Athena Masse, MD, 6 each at 07/18/21 0914   dexamethasone (DECADRON) tablet 4 mg, 4 mg, Oral, Daily, Sreenath, Sudheer B, MD, 4 mg at 07/18/21 0914   dronabinol (MARINOL) capsule 5 mg, 5 mg, Oral, BID AC, Sreenath, Sudheer B, MD, 5 mg at 07/18/21 1641   feeding supplement (ENSURE ENLIVE / ENSURE PLUS) liquid 237 mL, 237 mL, Oral, TID BM, Sreenath, Sudheer B, MD, 237 mL at 07/18/21 1354   furosemide (LASIX) tablet 20 mg, 20 mg, Oral, Daily, Wieting, Richard, MD, 20 mg at 07/18/21 0913   guaiFENesin-dextromethorphan (ROBITUSSIN DM) 100-10 MG/5ML syrup 5 mL, 5 mL, Oral, Q4H PRN, Judd Gaudier V, MD, 5 mL at 06/30/21 2144   insulin aspart (novoLOG) injection 0-15 Units, 0-15 Units, Subcutaneous, Q4H, Judd Gaudier V, MD, 5 Units at 07/18/21 1640   insulin aspart (novoLOG) injection 4 Units, 4 Units, Subcutaneous, Q4H, Fritzi Mandes, MD, 4 Units at 07/18/21 1639   insulin glargine-yfgn (SEMGLEE) injection 15 Units, 15 Units, Subcutaneous, BID, Fritzi Mandes, MD, 15 Units at 07/18/21 1120   iohexol (OMNIPAQUE) 9 MG/ML oral solution 1,000 mL, 1,000 mL, Oral, Once PRN, Mariel Aloe, MD   loperamide (IMODIUM) capsule 2 mg, 2 mg, Oral, TID, Fritzi Mandes, MD, 2 mg at 07/18/21 1354   loratadine (CLARITIN) tablet 10 mg, 10 mg, Oral, Daily, Judd Gaudier V, MD, 10 mg at 07/18/21 0913   losartan (COZAAR) tablet 25 mg, 25 mg, Oral, Daily, Judd Gaudier V, MD, 25 mg at 07/18/21 0913   metroNIDAZOLE (FLAGYL) tablet 500 mg, 500 mg, Oral, Q12H, Fritzi Mandes, MD, 500 mg at 07/18/21 0914   mirtazapine (REMERON) tablet 15 mg, 15 mg, Oral, QHS, Sreenath, Sudheer B, MD, 15 mg at 07/17/21 2112   ondansetron (ZOFRAN) tablet 4 mg, 4 mg, Oral, Q6H PRN **OR** ondansetron (ZOFRAN) injection 4 mg, 4 mg, Intravenous, Q6H PRN, Judd Gaudier V, MD, 4 mg at 06/30/21 0545   pantoprazole (PROTONIX) EC tablet 40 mg, 40 mg, Oral, Daily, Val Riles, MD, 40 mg at 07/18/21 0913    pregabalin (LYRICA) capsule 75 mg, 75 mg, Oral, BID, Judd Gaudier V, MD, 75 mg at 07/18/21 0913   psyllium (HYDROCIL/METAMUCIL) 1 packet, 1 packet, Oral, Daily, Fritzi Mandes, MD, 1 packet at 07/18/21 0914   sodium chloride (OCEAN) 0.65 % nasal spray 1 spray, 1 spray, Each Nare, PRN, Val Riles, MD   sodium chloride flush (NS) 0.9 % injection 10-40 mL, 10-40 mL, Intracatheter, Q12H, Wieting, Richard, MD, 10 mL at 07/18/21 0917   sodium chloride flush (NS) 0.9 % injection 10-40 mL, 10-40 mL, Intracatheter, PRN, Loletha Grayer, MD, 10 mL at 07/11/21 2132   sulfamethoxazole-trimethoprim (BACTRIM DS) 800-160 MG per tablet 1 tablet, 1 tablet,  Oral, Q12H, Fritzi Mandes, MD, 1 tablet at 07/18/21 0914   TPN ADULT (ION), , Intravenous, Continuous TPN, Dallie Piles, [facility], Last Rate: 84 mL/hr at 07/17/21 1736, New Bag at 07/17/21 1736   TPN ADULT (ION), , Intravenous, Continuous TPN, Dallie Piles, RPH  Facility-Administered Medications Ordered in Other Encounters:    0.9 %  sodium chloride infusion, , Intravenous, Continuous, Sindy Guadeloupe, MD, Stopped at 07/27/18 1037   heparin lock flush 100 unit/mL, 500 Units, Intravenous, Once, Sindy Guadeloupe, MD   sodium chloride flush (NS) 0.9 % injection 10 mL, 10 mL, Intravenous, PRN, Sindy Guadeloupe, MD, 10 mL at 07/27/18 0920   sodium chloride flush (NS) 0.9 % injection 10 mL, 10 mL, Intravenous, Once, Sindy Guadeloupe, MD  Physical exam:  Vitals:   07/17/21 1930 07/18/21 0424 07/18/21 0446 07/18/21 0744  BP: (!) 136/52 126/64  (!) 132/57  Pulse: 82 70  70  Resp: _0 Temp: 97.7 F (36.5 C) (!) 97.5 F (36.4 C)  97.7 F (36.5 C)  TempSrc: Oral Oral  Oral  SpO2: 98% 100%  100%  Weight:   145 lb 8.1 oz (66 kg)   Height:       Physical Exam Constitutional:      Comments: Frail and significantly deconditioned as compared to his baseline  Cardiovascular:     Rate and Rhythm: Normal rate and regular rhythm.     Heart sounds: Normal heart  sounds.  Pulmonary:     Effort: Pulmonary effort is normal.     Breath sounds: Normal breath sounds.  Abdominal:     General: Bowel sounds are normal.     Palpations: Abdomen is soft.     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  Neurological:     Mental Status: He is alert and oriented to person, place, and time.     CMP Latest Ref Rng & Units 07/18/2021  Glucose 70 - 99 mg/dL 182(H)  BUN 8 - 23 mg/dL 42(H)  Creatinine 0.61 - 1.24 mg/dL 0.72  Sodium 135 - 145 mmol/L 132(L)  Potassium 3.5 - 5.1 mmol/L 4.0  Chloride 98 - 111 mmol/L 107  CO2 22 - 32 mmol/L 21(L)  Calcium 8.9 - 10.3 mg/dL 8.3(L)  Total Protein 6.5 - 8.1 g/dL 5.7(L)  Total Bilirubin 0.3 - 1.2 mg/dL 0.4  Alkaline Phos 38 - 126 U/L 107  AST 15 - 41 U/L 18  ALT 0 - 44 U/L 29   CBC Latest Ref Rng & Units 07/16/2021  WBC 4.0 - 10.5 K/uL 14.2(H)  Hemoglobin 13.0 - 17.0 g/dL 9.3(L)  Hematocrit 39.0 - 52.0 % 29.4(L)  Platelets 150 - 400 K/uL 292    _1 @  DG Chest 2 View  Result Date: 06/30/2021 CLINICAL DATA:  Cough for 2 months. EXAM: CHEST - 2 VIEW COMPARISON:  CT of the chest on 06/12/2021, CT of the chest, abdomen, and pelvis on 03/13/2021 FINDINGS: Patient's LEFT-sided power port, tip overlying the level of the UPPER RIGHT atrium. RIGHT-sided PICC line tip overlies the superior vena cava. Heart is mildly enlarged. There are chronic Kerley B-lines in the lung bases, LEFT greater than RIGHT. Bilateral pleural effusions are present. RIGHT LOWER lobe mass measures 1.8 centimeters. Bibasilar atelectasis or infiltrates. IMPRESSION: Bilateral pleural effusions and bibasilar infiltrates. 1. Mild interstitial edema. 2. RIGHT LOWER lobe mass again noted. LEFT lingular  mass is not as well seen as on recent CT. Electronically Signed   By: Nolon Nations M.D.   On: 06/30/2021 12:21   CT ABDOMEN PELVIS W CONTRAST  Result Date: 07/16/2021 CLINICAL  DATA:  80 year old male, abdominal abscess suspected. EXAM: CT ABDOMEN AND PELVIS WITHOUT CONTRAST TECHNIQUE: Multidetector CT imaging of the abdomen and pelvis was performed following the standard protocol without IV contrast. COMPARISON:  06/26/2021, 03/13/2021 FINDINGS: Lower chest: Continued interval enlargement of previously visualized right lower lobe (22 x 18 mm, previously 16 x 14 mm) and lingular (incompletely visualized, measuring up to 14 mm, previously 13 mm) pulmonary metastases. No acute abnormality or new metastatic lesion. Interval resolution of previously visualized bilateral pleural effusions. Hepatobiliary: No focal liver abnormality is seen. Status post cholecystectomy. No biliary dilatation. Pancreas: Unremarkable. No pancreatic ductal dilatation or surrounding inflammatory changes. Spleen: Normal in size without focal abnormality. Adrenals/Urinary Tract: Adrenal glands are unremarkable. Kidneys are normal, without renal calculi, focal lesion, or hydronephrosis. Bladder is unremarkable. Stomach/Bowel: Stomach is within normal limits. Appendix appears normal. Again seen is a midline, periumbilical enteric cutaneous fistula which remains widely patent. Postsurgical changes after descending colon colostomy. No evidence of bowel wall thickening, distention, or inflammatory changes. Vascular/Lymphatic: Aortic atherosclerosis. No enlarged abdominal or pelvic lymph nodes. Decreased peripherally visualized mesenteric stranding. Reproductive: Prostate calcifications are again seen. Other: Interval decreased size of previously visualized right mid abdominal mesenteric peripherally enhancing fluid collection now measuring up to 5.1 x 1.2 cm (series 2, image 39), previously approximately 5 point 0 x 2.6 cm. There is trace residual peritoneal fluid collection containing a few tiny foci of gas extending leftward from the fistula site along the anterior abdominal wall, difficult to measure but definitively  smaller than comparison. New abdominal fluid collections. No new hernia or ascites. Musculoskeletal: No acute or significant osseous findings. IMPRESSION: 1. Interval decreased size of previously visualized intra-abdominal fluid collections. No new intra-abdominal fluid collections. Neither residual fluid collection is large enough to consider percutaneous drainage. 2. Persistent periumbilical enterocutaneous fistula. 3. Continued enlargement of pulmonary metastatic lesions in the right lower lobe and lingula. Ruthann Cancer, MD [facility] Electronically Signed   By: Ruthann Cancer M.D.   On: 07/16/2021 13:12   CT ABDOMEN PELVIS W CONTRAST  Result Date: 06/26/2021 CLINICAL DATA:  Ileostomy bag leakage EXAM: CT ABDOMEN AND PELVIS WITH CONTRAST TECHNIQUE: Multidetector CT imaging of the abdomen and pelvis was performed using the standard protocol following bolus administration of intravenous contrast. CONTRAST:  74m OMNIPAQUE IOHEXOL 350 MG/ML SOLN COMPARISON:  CT 03/13/2021, 11/28/2020 FINDINGS: Lower chest: Lung bases demonstrate small bilateral pleural effusions. Atelectasis or minimal pneumonia at the bases. 16 x 14 mm right lower lobe pulmonary nodule, previously 14 x 13 mm. Irregular lingular pulmonary nodule measuring 13 x 10 mm, previously 16 x 12 mm. Normal cardiac size. Hepatobiliary: Status post cholecystectomy. No focal hepatic abnormality. No intra or extrahepatic biliary dilatation Pancreas: Unremarkable. No pancreatic ductal dilatation or surrounding inflammatory changes. Spleen: Normal in size without focal abnormality. Adrenals/Urinary Tract: Adrenal glands are normal. Kidneys show no hydronephrosis. The bladder is normal Stomach/Bowel: The stomach is nonenlarged. Status post distal left colectomy with reanastomosis. Collapsed appearing colon. Contrast opacifies nondilated small bowel. Interval postsurgical changes of the small bowel. Large  ventral wound containing contrast which appears to communicate with thickened small bowel deep to the wound, series 2, image 61. Vascular/Lymphatic: Moderate aortic atherosclerosis. No aneurysm. No suspicious nodes. Reproductive: Prostate calcifications without mass  Other: No free air. Multiple mildly rim enhancing intra-abdominal fluid collections. 5 x 2.6 cm right mid abdominal collection, series 2, image 44. 2.4 x 1.8 cm fluid collection in the right mid lateral abdomen adjacent to small bowel, series 2, image 47 5.2 x 1.3 cm lenticular shaped collection within the left mid abdomen, coronal series 6, image 32. Multiple additional smaller rim enhancing collections within the abdomen concerning for abscess. Small rim enhancing perihepatic and perisplenic fluid collections. generalized soft tissue stranding within the mesentery which is new compared to prior. Musculoskeletal: No acute or significant osseous findings. IMPRESSION: 1. Large ventral wound containing small amount of contrast. Postsurgical changes of small bowel deep to the wound with enteral contrast from the small bowel extending into the large ventral wound suggesting enterocutaneous fistula. 2. Multiple intra-abdominal rim enhancing fluid collections as described above concerning for intra-abdominal abscess. Generalized soft tissue stranding throughout the mesentery, likely inflammatory. 3. Small bilateral pleural effusions with atelectasis or pneumonia at the bases. Metastatic lung nodules, slight increased size of right lower lobe metastatic nodule. Electronically Signed   By: Donavan Foil M.D.   On: 06/26/2021 19:03   DG Humerus Right  Result Date: 07/06/2021 CLINICAL DATA:  Foreign body, missing PICC line tip EXAM: RIGHT HUMERUS - 2+ VIEW COMPARISON:  None. FINDINGS: No fracture or dislocation is seen. Degenerative changes of the acromioclavicular joint. Visualized soft tissues are within normal limits. No radiopaque foreign body is seen.  IMPRESSION: No radiopaque foreign body is seen. Electronically Signed   By: Julian Hy M.D.   On: 07/06/2021 03:23   [place] EKG SITE RITE  Result Date: 07/06/2021 If [facility] image not attached, placement could not be confirmed due to current cardiac rhythm.  DG Outside Films Chest  Result Date: 06/27/2021 This examination belongs to an outside facility and is stored here for comparison purposes only.  Contact the originating outside institution for any associated report or interpretation.  DG Outside Films Chest  Result Date: 06/27/2021 This examination belongs to an outside facility and is stored here for comparison purposes only.  Contact the originating outside institution for any associated report or interpretation.  DG Outside Films Chest  Result Date: 06/27/2021 This examination belongs to an outside facility and is stored here for comparison purposes only.  Contact the originating outside institution for any associated report or interpretation.    Assessment and plan- Patient is a 80 y.o. male with metastatic colon cancer and lung metastases.  Patient has not received any chemotherapy since June 2022.  He was admitted for cholecystitis in [facility] in July 6144 which was complicated by bowel complications and prolonged hospitalization and eventually enterocutaneous fistula.  Patient now admitted for leakage around enterocutaneous fistula  I met with patient's son earlier this week and we have been updating patient's family as well from time to time.  From a colon cancer standpoint he is not a candidate for any systemic treatment at this time.  After his complications at [facility] patient went on from being completely independent and a performance status of 0-1 being significantly deconditioned and dependent on TPN to meet his calorie needs.  Plan is currently to try to wean him off TPN but he is not meeting his caloric needs with p.o. intake.  Last week patient was appearing  more confused but he has made some progress over the last 1 week and is working with physical therapy.  He is alert and oriented.  It is unlikely that he  can come off TPN.  Family is planning to arrange for 24-hour home health/ private pay sitters and continue TPN at least for a few weeks before deciding to stop TPN altogether.  If patient goes on TPN he will not be getting hospice services.  Patient's family remains hopeful that he may improve with TPN although it remains highly doubtful that patient can eventually get surgery for his enterocutaneous fistula at a tertiary center.   Visit Diagnosis 1. Abscess of intestine   2. 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 Abnormal findings on diagnostic imaging of gall bladder   5. Cough   6. Foreign body (FB) in soft tissue      Dr. Randa Evens, MD, MPH [facility] [phone] 07/18/2021 5:23 PM

## 2021-07-18 NOTE — Progress Notes (Signed)
Physical Therapy Treatment Patient Details Name: Miguel Flores MRN: [id number] DOB: [birth year] Today's Date: 07/18/2021    History of Present Illness 80 y.o. 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 [name] who presented with draining enterocutaneous fistula.     Patient has a complicated recent abdominal surgery history (see Dr. Deniece Ree note from 8/17) that began 6 weeks ago with cholecystitis at a hospital in [facility].  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  He ultimately went home, initially doing well and then started having issues with the ileostomy.    PT Comments    Patient is cooperative and eager to mobilize with therapy. Patient increased ambulation distance to 153ft using rolling walker this session. Overall activity tolerance limited by fatigue. No significant leaking noted from ostomy pouch with mobility efforts. Patient sitting up in chair at end of session and is asking to ambulate again today with staff assistance. PT will continue to follow to maximize independence.    Follow Up Recommendations  Home health PT;Supervision/Assistance - 24 hour     Equipment Recommendations  Rolling walker with 5" wheels;3in1 (PT)    Recommendations for Other Services       Precautions / Restrictions Precautions Precautions: Fall Restrictions Weight Bearing Restrictions: No    Mobility  Bed Mobility Overal bed mobility: Needs Assistance Bed Mobility: Supine to Sit     Supine to sit: Supervision;HOB elevated     General bed mobility comments: verbal cues for logroll technique. increased time required with no physical assistance needed    Transfers Overall transfer level: Needs assistance Equipment used: Rolling walker (2 wheeled) Transfers: Sit to/from  Stand   Stand pivot transfers: Min guard       General transfer comment: 3 standing bouts performed. verbal cues for technique and hand placement. nurse emptied ostomy pouch prior to ambulation. no leakage noted  Ambulation/Gait Ambulation/Gait assistance: Min guard Gait Distance (Feet): 130 Feet Assistive device: Rolling walker (2 wheeled) Gait Pattern/deviations: Step-through pattern Gait velocity: decreased   General Gait Details: slow but steady cadence. verbal cues to remain close to rolling walker for optimal support. no leaking noted from ostomy pouch with ambulation efforts. activity tolerance limited by fatigue. Sp02 97% on room air after walking   Stairs             Wheelchair Mobility    Modified Rankin (Stroke Patients Only)       Balance                                            Cognition Arousal/Alertness: Awake/alert Behavior During Therapy: WFL for tasks assessed/performed Overall Cognitive Status: Within Functional Limits for tasks assessed                                 General Comments: patient able to follow single step commands with extra time.      Exercises      General Comments        Pertinent Vitals/Pain Pain Assessment: No/denies pain    Home Living  Prior Function            PT Goals (current goals can now be found in the care plan section) Acute Rehab PT Goals Patient Stated Goal: to walk more PT Goal Formulation: With patient Time For Goal Achievement: 07/25/21 Potential to Achieve Goals: Good Progress towards PT goals: Progressing toward goals    Frequency    Min 2X/week      PT Plan Current plan remains appropriate    Co-evaluation              AM-PAC PT "6 Clicks" Mobility   Outcome Measure  Help needed turning from your back to your side while in a flat bed without using bedrails?: A Little Help needed moving from lying on your  back to sitting on the side of a flat bed without using bedrails?: A Little Help needed moving to and from a bed to a chair (including a wheelchair)?: A Little Help needed standing up from a chair using your arms (e.g., wheelchair or bedside chair)?: A Little Help needed to walk in hospital room?: A Little Help needed climbing 3-5 steps with a railing? : A Little 6 Click Score: 18    End of Session   Activity Tolerance: Patient tolerated treatment well Patient left: in chair;with call bell/phone within reach;with chair alarm set Nurse Communication: Mobility status PT Visit Diagnosis: Muscle weakness (generalized) (M62.81);Difficulty in walking, not elsewhere classified (R26.2)     Time: 5830-9407 PT Time Calculation (min) (ACUTE ONLY): 24 min  Charges:  $Therapeutic Activity: 23-37 mins                     Minna Merritts, PT, MPT    Percell Locus 07/18/2021, 12:37 PM

## 2021-07-18 NOTE — Progress Notes (Signed)
[place] at [place] NAME: Miguel Flores    MR#:  [id number]  DATE OF BIRTH:  1941-08-28  SUBJECTIVE:  patient sitting up in the bed ate some BF earlier Pt motivated to work with therapy  No family  REVIEW OF SYSTEMS:   Review of Systems  Constitutional:  Negative for chills, fever and weight loss.  HENT:  Negative for ear discharge, ear pain and nosebleeds.   Eyes:  Negative for blurred vision, pain and discharge.  Respiratory:  Negative for sputum production, shortness of breath, wheezing and stridor.   Cardiovascular:  Negative for chest pain, palpitations, orthopnea and PND.  Gastrointestinal:  Negative for abdominal pain, diarrhea, nausea and vomiting.  Genitourinary:  Negative for frequency and urgency.  Musculoskeletal:  Negative for back pain and joint pain.  Neurological:  Positive for weakness. Negative for sensory change, speech change and focal weakness.  Psychiatric/Behavioral:  Negative for depression and hallucinations. The patient is not nervous/anxious.   Tolerating Diet: yes Tolerating PT: HHPT  DRUG ALLERGIES:  No Known Allergies  VITALS:  Blood pressure (!) 132/57, pulse 70, temperature 97.7 F (36.5 C), temperature source Oral, resp. rate 18, height 5' 5.98" (1.676 m), weight 66 kg, SpO2 100 %.  PHYSICAL EXAMINATION:   Physical Exam  GENERAL:  80 y.o.-year-old patient lying in the bed with no acute distress.   weak LUNGS: Normal breath sounds bilaterally, no wheezing, rales, rhonchi. No use of accessory muscles of respiration. PORT+ CARDIOVASCULAR: S1, S2 normal. No murmurs, rubs, or gallops.  ABDOMEN: Soft, nontender, nondistended. Ileostomy bag+ EXTREMITIES: No cyanosis, clubbing or edema b/l.    NEUROLOGIC: non focal PSYCHIATRIC:  patient is alert and awake SKIN: No obvious rash, lesion, or ulcer.   LABORATORY PANEL:  CBC Recent Labs  Lab 07/16/21 0542  WBC 14.2*  HGB 9.3*  HCT 29.4*  PLT 292      Chemistries  Recent Labs  Lab 07/18/21 0445  NA 132*  K 4.0  CL 107  CO2 21*  GLUCOSE 182*  BUN 42*  CREATININE 0.72  CALCIUM 8.3*  MG 2.0  AST 18  ALT 29  ALKPHOS 107  BILITOT 0.4    Cardiac Enzymes No results for input(s): TROPONINI in the last 168 hours. RADIOLOGY:  No results found. ASSESSMENT AND PLAN:  Miguel Flores is a 80 y.o. 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 Patient presented secondary to Eakin pouch leaking/complications from enterocutaneous fistula. Patient found to have multiple abdominal fluid collections concerning for abscesses and decision initially was to treat conservatively.    Intra-abdominal abscesses CT abd from 8/17 showed --Three larger abscess with multiple small abscesses seen on CT scan from 8/17. Patient has been on antibiotics for UTI. Recently worsened leukocytosis possibly related to abscesses but unsure.  -Repeat CT abdomen/pelvis with contrast today -infectious disease consults since patient with elevated WBC for help with abxs -Currently on IV zosyn --9/6--CT abdomen with contrast shows  Interval decreased size of previously visualized intra-abdominal fluid collections. No new intra-abdominal fluid collections. Neither residual fluid collection is large enough to consider percutaneous drainage. 2. Persistent periumbilical enterocutaneous fistula. 3. Continued enlargement of pulmonary metastatic lesions in the right lower lobe and lingula. --9/7-- patient afebrile. White count trending down. Tolerating some PO diet. Continuing IV TPN for now and patient will discharged home with TPN according to son Miguel Flores in the room. -- Patient  ambulated with physical therapy 125 feet yesterday. Will try to PT alternating with OT while patient is in-house. -- Wound nurse to see patient regarding issues  with pouch leakage and discussed various options with son. -- Per ID Dr. Steva Ready change to oral Bactrim DS BID and Flagyl 500 BID for seven more days -- per Dr. Dahlia Byes no new surgical recommendation --9/8--no new issues. 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   Moderate protein calorie nutrition --Patient presented on TPN.  -Continue TPN to manage goal rate since family plans on taking patient home on TPN. -Dietitian recommendations appreciated   Acute metabolic encephalopathy Possibly related to UTI infection. Appears to have resolved.   Urinary tract infection Urine culture (8/26) significant for E. Coli, resistant to ampicillin/ceftriaxone. Patient treated with 7 days of ciprofloxacin IV.   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 [name]-- patient is not a candidate for any further chemo at present per Dr. [name] Pt has been seen by [facility] care   Hyponatremia Mild. Asymptomatic. Thought to be secondary to poor oral intake.   Primary hypertension -Continue losartan   Diabetes mellitus type 2 Polyneuropathy -Continue Semglee, SSI, Lyrica       DVT prophylaxis: SCDs Code Status:   Code Status: Full Code Family Communication: son Miguel Flores on 9/7 Disposition Plan: Discharge home.   Family plans to take patient home once his 24 hour nursing/caregiver is set up which likely is going to be early of next week. Patient will go home with continuous IV TPN.    TOTAL TIME TAKING CARE OF THIS PATIENT: 25 minutes.  >50% time spent on counselling and coordination of care  Note: This dictation was prepared with Dragon dictation along with smaller phrase technology. Any transcriptional errors that result from this process are unintentional.  Fritzi Mandes M.D    [facility]   CC: Primary care physician; [name], MD Patient ID: Miguel Flores, male   DOB: [birth year], 80 y.o.   MRN:  [id number]

## 2021-07-18 NOTE — Progress Notes (Signed)
Inpatient Diabetes Program Recommendations  AACE/ADA: New Consensus Statement on Inpatient Glycemic Control   Target Ranges:  Prepandial:   less than 140 mg/dL      Peak postprandial:   less than 180 mg/dL (1-2 hours)      Critically ill patients:  140 - 180 mg/dL   Results for TIELER, COURNOYER (MRN [id number]) as of 07/18/2021 08:44  Ref. Range 07/17/2021 08:16 07/17/2021 11:35 07/17/2021 15:39 07/17/2021 19:46 07/17/2021 23:30 07/18/2021 04:06 07/18/2021 04:13  Glucose-Capillary Latest Ref Range: 70 - 99 mg/dL 176 (H) 367 (H) 225 (H) 306 (H) 368 (H) 196 (H) 186 (H)    Review of Glycemic Control  Current orders for Inpatient glycemic control: Semglee 12 units BID, Novolog 0-15 units Q4H; Decadron 4 mg daily, TPN @ 84 ml/hr  Inpatient Diabetes Program Recommendations:    Insulin:  Please consider ordering Novolog 4 units Q4H for TPN coverage. If TPN is stopped or held then Novolog TPN coverage should also be stopped or held.   NOTE: Glucose has ranged from 176-368 mg/dl over the past 24 hours and patient received a total of Novolog 52 units for correction over the past 24 hours. Recommend ordering Novolog TPN coverage.  Thanks, Barnie Alderman, RN, MSN, CDE Diabetes Coordinator Inpatient Diabetes Program [phone] (Team Pager from 8am to 5pm)

## 2021-07-18 NOTE — Progress Notes (Signed)
Patient's stool from ostomy is noted to be forming at this time but has a a small amount of blood noted in bag. MD made aware, will continue to monitor at this time.

## 2021-07-18 NOTE — Progress Notes (Signed)
Patients Ostomy bag had to be changed twice on shift due to leaking liquid stool, surrounding skin has erythema. Strap had to be soaked to clean appropriately, there was no extra strap in room therefore at this time strap not attached to ileostomy bag.

## 2021-07-19 LAB — GLUCOSE, CAPILLARY
Glucose-Capillary: 108 mg/dL — ABNORMAL HIGH (ref 70–99)
Glucose-Capillary: 174 mg/dL — ABNORMAL HIGH (ref 70–99)
Glucose-Capillary: 127 mg/dL — ABNORMAL HIGH (ref 70–99)
Glucose-Capillary: 204 mg/dL — ABNORMAL HIGH (ref 70–99)
Glucose-Capillary: 260 mg/dL — ABNORMAL HIGH (ref 70–99)
Glucose-Capillary: 233 mg/dL — ABNORMAL HIGH (ref 70–99)

## 2021-07-19 MED ORDER — CHROMIC CHLORIDE 40 MCG/10ML IV SOLN
INTRAVENOUS | Status: AC
  Filled 2021-07-19: qty 1068.5

## 2021-07-19 NOTE — TOC Progression Note (Signed)
Transition of Care [facility]) - Progression Note    Patient Details  Name: Miguel Flores MRN: [id number] Date of Birth: 1941/04/12  Transition of Care [facility]) CM/SW [place], LCSW Phone Number: 07/19/2021, 1:09 PM  Clinical Narrative:   Call to son [name]. He stated patient already has a BSC at home.     Expected Discharge Plan:  (TBD) Barriers to Discharge: Continued Medical Work up  Expected Discharge Plan and Services Expected Discharge Plan:  (TBD)     Post Acute Care Choice:  (TBD) Living arrangements for the past 2 months: Single Family Home                                       Social Determinants of Health (SDOH) Interventions    Readmission Risk Interventions No flowsheet data found.

## 2021-07-19 NOTE — Progress Notes (Addendum)
OT Cancellation Note  Patient Details Name: Miguel Flores MRN: [id number] DOB: 1941/01/30   Cancelled Treatment:    Reason Eval/Treat Not Completed: Medical issues which prohibited therapy. Upon arrival pt noted to have ostomy bag leaking, RN aware. Will follow up at later date/time as able to complete re-evaluation and update POC.   Dessie Coma, M.S. OTR/L  07/19/21, 10:47 AM  ascom [phone]

## 2021-07-19 NOTE — Progress Notes (Signed)
ID See full consult note from 9/6 Impression/Recommendation Complicated intraabdominal pathology- with recent cholecystectomy complicated by bowel perforation, multiple surgeries, enterocutaneous fistula,  Now has some small intra- abdominal collections- likely due to previous residual collections Repeat Ct abdomen done today and it shows stable to slightly reduced collection Leucocytosis has improved IV zosyn changed to  bactrim DS I bid and flagyl 500mg  po BID for 7 days. HE hs received 14 days of antibiotic so far Pt has metastatic colon cancer with lung mets   Discussed the management with care team ID will sign off- call if needed

## 2021-07-19 NOTE — Progress Notes (Signed)
[place] at [place] NAME: Miguel Flores    MR#:  [id number]  DATE OF BIRTH:  1940-11-28  SUBJECTIVE:  Had some leakage of ostomy bag--RN reported pt unknowingly pulls it  No family at bedisde  REVIEW OF SYSTEMS:   Review of Systems  Constitutional:  Negative for chills, fever and weight loss.  HENT:  Negative for ear discharge, ear pain and nosebleeds.   Eyes:  Negative for blurred vision, pain and discharge.  Respiratory:  Negative for sputum production, shortness of breath, wheezing and stridor.   Cardiovascular:  Negative for chest pain, palpitations, orthopnea and PND.  Gastrointestinal:  Negative for abdominal pain, diarrhea, nausea and vomiting.  Genitourinary:  Negative for frequency and urgency.  Musculoskeletal:  Negative for back pain and joint pain.  Neurological:  Positive for weakness. Negative for sensory change, speech change and focal weakness.  Psychiatric/Behavioral:  Negative for depression and hallucinations. The patient is not nervous/anxious.   Tolerating Diet: yes Tolerating PT: HHPT  DRUG ALLERGIES:  No Known Allergies  VITALS:  Blood pressure (!) 126/51, pulse 70, temperature 98.6 F (37 C), resp. rate 18, height 5' 5.98" (1.676 m), weight 66.2 kg, SpO2 100 %.  PHYSICAL EXAMINATION:   Physical Exam  GENERAL:  80 y.o.-year-old patient lying in the bed with no acute distress.   weak LUNGS: Normal breath sounds bilaterally, no wheezing, rales, rhonchi. No use of accessory muscles of respiration. PORT+ CARDIOVASCULAR: S1, S2 normal. No murmurs, rubs, or gallops.  ABDOMEN: Soft, nontender, nondistended. Ileostomy bag+ EXTREMITIES: No cyanosis, clubbing or edema b/l.    NEUROLOGIC: non focal PSYCHIATRIC:  patient is alert and awake SKIN: No obvious rash, lesion, or ulcer.   LABORATORY PANEL:  CBC Recent Labs  Lab 07/16/21 0542  WBC 14.2*  HGB 9.3*  HCT 29.4*  PLT 292     Chemistries  Recent  Labs  Lab 07/18/21 0445  NA 132*  K 4.0  CL 107  CO2 21*  GLUCOSE 182*  BUN 42*  CREATININE 0.72  CALCIUM 8.3*  MG 2.0  AST 18  ALT 29  ALKPHOS 107  BILITOT 0.4    Cardiac Enzymes No results for input(s): TROPONINI in the last 168 hours. RADIOLOGY:  No results found. ASSESSMENT AND PLAN:  MANDEEP Flores is a 80 y.o. 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 Patient presented secondary to Eakin pouch leaking/complications from enterocutaneous fistula. Patient found to have multiple abdominal fluid collections concerning for abscesses and decision initially was to treat conservatively.    Intra-abdominal abscesses CT abd from 8/17 showed --Three larger abscess with multiple small abscesses seen on CT scan from 8/17. Patient has been on antibiotics for UTI. Recently worsened leukocytosis possibly related to abscesses but unsure.  -Repeat CT abdomen/pelvis with contrast today -infectious disease consults since patient with elevated WBC for help with abxs -Currently on IV zosyn --9/6--CT abdomen with contrast shows  Interval decreased size of previously visualized intra-abdominal fluid collections. No new intra-abdominal fluid collections. Neither residual fluid collection is large enough to consider percutaneous drainage. 2. Persistent periumbilical enterocutaneous fistula. 3. Continued enlargement of pulmonary metastatic lesions in the right lower lobe and lingula. --9/7-- patient afebrile. White count trending down. Tolerating some PO diet. Continuing IV TPN for now and patient will discharged home with TPN according to son Miguel Flores in the room. -- Patient ambulated with physical therapy 125 feet  yesterday. Will try to PT alternating with OT while patient is in-house. -- Wound nurse to see patient regarding issues with pouch leakage and discussed  various options with son. -- Per ID Dr. Steva Ready change to oral Bactrim DS BID and Flagyl 500 BID for seven more days -- per Dr. Dahlia Byes no new surgical recommendation --9/8--no new issues. 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   Moderate protein calorie nutrition --Patient presented on TPN.  -Continue TPN to manage goal rate since family plans on taking patient home on TPN. -Dietitian recommendations appreciated   Acute metabolic encephalopathy Possibly related to UTI infection. Appears to have resolved.   Urinary tract infection Urine culture (8/26) significant for E. Coli, resistant to ampicillin/ceftriaxone. Patient treated with 7 days of ciprofloxacin IV.   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 [name]-- patient is not a candidate for any further chemo at present per Dr. [name] Pt has been seen by [facility] care   Hyponatremia Mild. Asymptomatic. Thought to be secondary to poor oral intake.   Primary hypertension -Continue losartan   Diabetes mellitus type 2 Polyneuropathy -Continue Semglee, SSI, Lyrica       DVT prophylaxis: SCDs Code Status:   Code Status: Full Code Family Communication: son Miguel Flores on 9/9 Disposition Plan: Discharge home.   Family plans to take patient home once his 24 hour nursing/caregiver is set up which likely is going to be early of next week. Patient will go home with continuous IV TPN.    TOTAL TIME TAKING CARE OF THIS PATIENT: 25 minutes.  >50% time spent on counselling and coordination of care  Note: This dictation was prepared with Dragon dictation along with smaller phrase technology. Any transcriptional errors that result from this process are unintentional.  Fritzi Mandes M.D    [facility]   CC: Primary care physician; [name], MD Patient ID: Miguel Flores, male   DOB: [birth year], 80 y.o.   MRN:  [id number]

## 2021-07-19 NOTE — Progress Notes (Signed)
Ostomy pouch was changed due to leakage. Pouch is 1/2 filled with liquid stool and bright red blood is also noted in the pouch. Patient tolerated dressing change well.

## 2021-07-19 NOTE — Progress Notes (Signed)
Patient is alert and confused. Due to confusion, he was able to pull of his ostomy. Patient is given a bed bath and new ostomy dressing is done. Soft mitten are placed on patient. Patient is positioned upright in bed watching tv at this time. He remain pleasantly confused but no distress is noted.

## 2021-07-19 NOTE — Consult Note (Signed)
PHARMACY - TOTAL PARENTERAL NUTRITION CONSULT NOTE   Indication: Fistula  Patient Measurements: Height: 5' 5.98" (167.6 cm) Weight: 66.2 kg (145 lb 15.1 oz) IBW/kg (Calculated) : 63.76 TPN AdjBW (KG): 74.4 Body mass index is 23.57 kg/m.  Assessment:  Patient is an 80 y/o M with medical history including diabetes, HTN, colon cancer with lung metastases, recent history of admission at OSH for cholecystitis s/p cholecystectomy c/b post-op sepsis. Patient underwent ExLap with findings of small bowel injury which was repaired. 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 He is now presenting to the hospital for ileostomy leakage. Imaging notable for multiple intra-abdominal abscess with contrast extravasation to midline of abdominal wound. General surgery is following.   Pharmacy consulted to continue home TPN while patient is admitted.  Glucose / Insulin: History of controlled diabetes (last Hgb A1c 6.9%).  BG 108 - 316 Novolog  SSI q4  +  15 units BID glargine + Novolog 4 units q4h  Electrolytes: hyponatremia, others WNL (pt on lasix 20mg  po daily) Renal: SCr stable < 1 Hepatic: LFTs, TG wnl Intake / Output; MIVF: no MIVF / (-) 4490 mL net  GI Imaging: 8/17 CT abdomen / pelvis: Large ventral wound containing small amount of contrast. Enterocutaneous fistula. Multiple intra-abdominal abscess(s). Mesenteric inflammation. Small bilateral pleural effusions with atelectasis or pneumonia at the bases. Metastatic lung nodules, slight increased size of right lower lobe metastatic nodule  GI Surgeries / Procedures:  History of recent cholecystectomy, ExLap at OSH in 05/2021  Central access: Port (see note above) TPN start date: 8/18   Outpatient TPN information: [facility] in [place], [place] Ph: [phone]  Nutritional Goals: Goal TPN rate 84 mL/hr (provides 106.9 g of protein and 2264 kcals per day)   RD  Assessment: Estimated Needs Total Energy Estimated Needs: 2000-2300kcal/day Total Protein Estimated Needs: 100-115g/day Total Fluid Estimated Needs: 2.0-2.3L/day  Current Nutrition:  TPN dysphagia 3 diet, Ensure TID  Plan:  continue TPN at full rate- 84 mL/hr (total volume w/ overfill 2116 mL)  Nutritional components: Amino acids (Travasol 10%): 106.9 grams Lipids (20% SMOF): 70.6 grams Dextrose: 332.6 grams kCal: 2264 / 24h Electrolytes in TPN: Na 85 mEq/L (increased from 80 to 85 meq/L on 9/5), KCl 15 mEq/L, Ca 5 mEq/L, Mg 21mEq/L, and Phos 10mmol/L. Cl:Ac 1:1 Add standard MVI and trace elements to TPN Patient on dexamethasone  Symglee (insulin glargine) 15 units BID + SSI moderate + 4 units Novolog q4h Monitor TPN labs on Mon/Thurs   Dallie Piles, PharmD Clinical Pharmacist 07/19/2021 7:15 AM

## 2021-07-20 LAB — GLUCOSE, CAPILLARY
Glucose-Capillary: 151 mg/dL — ABNORMAL HIGH (ref 70–99)
Glucose-Capillary: 154 mg/dL — ABNORMAL HIGH (ref 70–99)
Glucose-Capillary: 212 mg/dL — ABNORMAL HIGH (ref 70–99)
Glucose-Capillary: 233 mg/dL — ABNORMAL HIGH (ref 70–99)
Glucose-Capillary: 244 mg/dL — ABNORMAL HIGH (ref 70–99)
Glucose-Capillary: 254 mg/dL — ABNORMAL HIGH (ref 70–99)

## 2021-07-20 LAB — CBC
HCT: 30.5 % — ABNORMAL LOW (ref 39.0–52.0)
Hemoglobin: 9.9 g/dL — ABNORMAL LOW (ref 13.0–17.0)
MCH: 27.6 pg (ref 26.0–34.0)
MCHC: 32.5 g/dL (ref 30.0–36.0)
MCV: 85 fL (ref 80.0–100.0)
Platelets: 233 10*3/uL (ref 150–400)
RBC: 3.59 MIL/uL — ABNORMAL LOW (ref 4.22–5.81)
RDW: 18.6 % — ABNORMAL HIGH (ref 11.5–15.5)
WBC: 16.3 10*3/uL — ABNORMAL HIGH (ref 4.0–10.5)
nRBC: 0 % (ref 0.0–0.2)

## 2021-07-20 MED ORDER — INSULIN GLARGINE-YFGN 100 UNIT/ML ~~LOC~~ SOLN
2.0000 [IU] | Freq: Once | SUBCUTANEOUS | Status: AC
  Administered 2021-07-20: 2 [IU] via SUBCUTANEOUS
  Filled 2021-07-20: qty 0.02

## 2021-07-20 MED ORDER — INSULIN GLARGINE-YFGN 100 UNIT/ML ~~LOC~~ SOLN
17.0000 [IU] | Freq: Two times a day (BID) | SUBCUTANEOUS | Status: DC
  Administered 2021-07-20: 17 [IU] via SUBCUTANEOUS
  Filled 2021-07-20 (×3): qty 0.17

## 2021-07-20 MED ORDER — CHROMIC CHLORIDE 40 MCG/10ML IV SOLN
INTRAVENOUS | Status: AC
  Filled 2021-07-20: qty 1068.5

## 2021-07-20 NOTE — Consult Note (Signed)
PHARMACY - TOTAL PARENTERAL NUTRITION CONSULT NOTE   Indication: Fistula  Patient Measurements: Height: 5' 5.98" (167.6 cm) Weight: 66.2 kg (145 lb 15.1 oz) IBW/kg (Calculated) : 63.76 TPN AdjBW (KG): 74.4 Body mass index is 23.57 kg/m.  Assessment:  Patient is an 80 y/o M with medical history including diabetes, HTN, colon cancer with lung metastases, recent history of admission at OSH for cholecystitis s/p cholecystectomy c/b post-op sepsis. Patient underwent ExLap with findings of small bowel injury which was repaired. 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 He is now presenting to the hospital for ileostomy leakage. Imaging notable for multiple intra-abdominal abscess with contrast extravasation to midline of abdominal wound. General surgery is following.   Pharmacy consulted to continue home TPN while patient is admitted.  Glucose / Insulin: History of controlled diabetes (last Hgb A1c 6.9%).  BG 127-260 Novolog  SSI-moderate q4 h +  15 units BID glargine (increased to 15 units BID on 07/18/21 )+ Novolog 4 units q4h Novolog total per 24 hrs: 40 units  Electrolytes: hyponatremia, others WNL (pt on lasix 20mg  po daily) Renal: SCr stable < 1 Hepatic: LFTs, TG wnl Intake / Output; MIVF: no MIVF / (-) 4490 mL net  GI Imaging: 8/17 CT abdomen / pelvis: Large ventral wound containing small amount of contrast. Enterocutaneous fistula. Multiple intra-abdominal abscess(s). Mesenteric inflammation. Small bilateral pleural effusions with atelectasis or pneumonia at the bases. Metastatic lung nodules, slight increased size of right lower lobe metastatic nodule  GI Surgeries / Procedures:  History of recent cholecystectomy, ExLap at OSH in 05/2021  Central access: Port (see note above) TPN start date: 8/18   Outpatient TPN information: [facility] in [place], [place] Ph: [phone]  Nutritional  Goals: Goal TPN rate 84 mL/hr (provides 106.9 g of protein and 2264 kcals per day)   RD Assessment: Estimated Needs Total Energy Estimated Needs: 2000-2300kcal/day Total Protein Estimated Needs: 100-115g/day Total Fluid Estimated Needs: 2.0-2.3L/day  Current Nutrition:  TPN dysphagia 3 diet, Ensure TID  Plan:  continue TPN at full rate- 84 mL/hr (total volume w/ overfill 2116 mL)  Nutritional components: Amino acids (Travasol 10%): 106.9 grams Lipids (20% SMOF): 70.6 grams Dextrose: 332.6 grams kCal: 2264 / 24h Electrolytes in TPN: Na 85 mEq/L (increased from 80 to 85 meq/L on 07/15/21), KCl 15 mEq/L, Ca 5 mEq/L, Mg 14mEq/L, and Phos 28mmol/L. Cl:Ac 1:1 -Patient on lasix 20 mg PO daily -Patient on Bactrim BID thru 9/13 Add standard MVI and trace elements to TPN Patient on dexamethasone 4 mg Po daily. Symglee (insulin glargine) 15 units BID + novolog SSI moderate + 4 units Novolog q4h scheduled Total Novolog units/24 hrs:  40 units Will order extra insulin glargine 2 units this am for a total of 17 units and adjust to 17 units BID Monitor TPN labs on Mon/Thurs. Will f/u labs in am   Noralee Space, PharmD Clinical Pharmacist 07/20/2021 9:07 AM

## 2021-07-20 NOTE — Evaluation (Signed)
Occupational Therapy Re-Evaluation Patient Details Name: Miguel Flores MRN: [id number] DOB: [birth year] Today's Date: 07/20/2021    History of Present Illness 80 y.o. 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 [name] who presented with draining enterocutaneous fistula.     Patient has a complicated recent abdominal surgery history (see Dr. Deniece Ree note from 8/17) that began 6 weeks ago with cholecystitis at a hospital in [facility].  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  He ultimately went home, initially doing well and then started having issues with the ileostomy.   Clinical Impression   Miguel Flores was seen for OT re-evaluation 2/2 prolonged length of stay and meeting goals. Upon arrival to room pt reclined in bed eager for mobility. Pt requires SBA + no UE support for functional reach outside BOS to raise blinds. SETUP don B socks at bed level. SBA + RW for ADL t/f and ~25 ft mobility, cues for line mgmt. Pt return demonstrated HEP exercises including seated and standing marches. Pt left in bed all needs in reach, meal setup provided. Goals met and upgraded this date. Pt continues to benefit from skilled OT services to maximize return to PLOF and minimize risk of future falls, injury, caregiver burden, and readmission. Will continue to follow POC. Discharge recommendation remains appropriate.      Follow Up Recommendations  Home health OT;Supervision/Assistance - 24 hour    Equipment Recommendations  3 in 1 bedside commode    Recommendations for Other Services       Precautions / Restrictions Precautions Precautions: Fall Precaution Comments: ostomy (plus needs to wear ostomy belt) and IV's; L chest port; L midline Restrictions Weight Bearing Restrictions: No       Mobility Bed Mobility Overal bed mobility: Needs Assistance Bed Mobility: Supine to Sit;Sit to Supine     Supine to sit: Supervision;HOB elevated Sit to supine: Supervision        Transfers Overall transfer level: Needs assistance Equipment used: Rolling walker (2 wheeled) Transfers: Sit to/from Stand Sit to Stand: Min guard         General transfer comment: SBA + RW - assist for lines mgmt    Balance Overall balance assessment: Needs assistance Sitting-balance support: Feet supported Sitting balance-Leahy Scale: Good     Standing balance support: No upper extremity supported;During functional activity Standing balance-Leahy Scale: Good                             ADL either performed or assessed with clinical judgement   ADL Overall ADL's : Needs assistance/impaired                                       General ADL Comments: SBA + no UE support for functional reach outside BOS to raise blinds. SETUP don B socks at bed level. SBA + RW for ADL t/f and ~25 ft mobility      Pertinent Vitals/Pain Pain Assessment: No/denies pain           Communication     Cognition Arousal/Alertness: Awake/alert Behavior During Therapy: WFL for tasks assessed/performed Overall Cognitive Status: Within Functional Limits for tasks assessed  General Comments: verbal and tactile cues t/o session. Good safety awareness this session - requests call bell/phone at end of sesssion   General Comments       Exercises Exercises: Other exercises Other Exercises Other Exercises: Pt educated re;  d/c recs, falls prevention, ECS Other Exercises: LBD, ~25 ft mobility, sup<>sit, sit<>stand x2, sitting/standing balance/tolerance, functional reach                   OT Goals(Current goals can be found in the care plan section) Acute Rehab OT Goals Patient Stated Goal: to walk more OT Goal Formulation: With  patient/family Time For Goal Achievement: 08/03/21 Potential to Achieve Goals: Good ADL Goals Pt Will Perform Grooming: with modified independence;standing Pt Will Perform Lower Body Dressing: with modified independence;sit to/from stand Pt Will Transfer to Toilet: with modified independence;ambulating;regular height toilet  OT Frequency: Min 1X/week    AM-PAC OT "6 Clicks" Daily Activity     Outcome Measure Help from another person eating meals?: None Help from another person taking care of personal grooming?: A Little Help from another person toileting, which includes using toliet, bedpan, or urinal?: A Little Help from another person bathing (including washing, rinsing, drying)?: A Little Help from another person to put on and taking off regular upper body clothing?: A Little Help from another person to put on and taking off regular lower body clothing?: A Little 6 Click Score: 19   End of Session Equipment Utilized During Treatment: Rolling walker  Activity Tolerance: Patient tolerated treatment well Patient left: in bed;with call bell/phone within reach;with bed alarm set  OT Visit Diagnosis: Other abnormalities of gait and mobility (R26.89);Muscle weakness (generalized) (M62.81)                Time: 8341-9622 OT Time Calculation (min): 18 min Charges:  OT General Charges $OT Visit: 1 Visit OT Evaluation $OT Re-eval: 1 Re-eval OT Treatments $Self Care/Home Management : 8-22 mins  Dessie Coma, M.S. OTR/L  07/20/21, 9:25 AM  ascom [phone]

## 2021-07-20 NOTE — Progress Notes (Signed)
[place] at [place] NAME: Miguel Flores    MR#:  [id number]  DATE OF BIRTH:  02-25-1941  SUBJECTIVE:  no new issues per RN. Patient resting comfortably. No family at bedisde  REVIEW OF SYSTEMS:   Review of Systems  Constitutional:  Negative for chills, fever and weight loss.  HENT:  Negative for ear discharge, ear pain and nosebleeds.   Eyes:  Negative for blurred vision, pain and discharge.  Respiratory:  Negative for sputum production, shortness of breath, wheezing and stridor.   Cardiovascular:  Negative for chest pain, palpitations, orthopnea and PND.  Gastrointestinal:  Negative for abdominal pain, diarrhea, nausea and vomiting.  Genitourinary:  Negative for frequency and urgency.  Musculoskeletal:  Negative for back pain and joint pain.  Neurological:  Positive for weakness. Negative for sensory change, speech change and focal weakness.  Psychiatric/Behavioral:  Negative for depression and hallucinations. The patient is not nervous/anxious.   Tolerating Diet: yes Tolerating PT: HHPT  DRUG ALLERGIES:  No Known Allergies  VITALS:  Blood pressure (!) 125/49, pulse 70, temperature 98.2 F (36.8 C), temperature source Oral, resp. rate 18, height 5' 5.98" (1.676 m), weight 66.2 kg, SpO2 100 %.  PHYSICAL EXAMINATION:   Physical Exam  GENERAL:  80 y.o.-year-old patient lying in the bed with no acute distress.   weak LUNGS: Normal breath sounds bilaterally, no wheezing, rales, rhonchi. No use of accessory muscles of respiration. PORT+ CARDIOVASCULAR: S1, S2 normal. No murmurs, rubs, or gallops.  ABDOMEN: Soft, nontender, nondistended. Ileostomy bag+ EXTREMITIES: No cyanosis, clubbing or edema b/l.    NEUROLOGIC: non focal PSYCHIATRIC:  patient is alert and awake SKIN: No obvious rash, lesion, or ulcer.   LABORATORY PANEL:  CBC Recent Labs  Lab 07/16/21 0542  WBC 14.2*  HGB 9.3*  HCT 29.4*  PLT 292     Chemistries   Recent Labs  Lab 07/18/21 0445  NA 132*  K 4.0  CL 107  CO2 21*  GLUCOSE 182*  BUN 42*  CREATININE 0.72  CALCIUM 8.3*  MG 2.0  AST 18  ALT 29  ALKPHOS 107  BILITOT 0.4    Cardiac Enzymes No results for input(s): TROPONINI in the last 168 hours. RADIOLOGY:  No results found. ASSESSMENT AND PLAN:  Miguel Flores is a 80 y.o. 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 Patient presented secondary to Eakin pouch leaking/complications from enterocutaneous fistula. Patient found to have multiple abdominal fluid collections concerning for abscesses and decision initially was to treat conservatively.    Intra-abdominal abscesses CT abd from 8/17 showed --Three larger abscess with multiple small abscesses seen on CT scan from 8/17. Patient has been on antibiotics for UTI. Recently worsened leukocytosis possibly related to abscesses but unsure.  -Repeat CT abdomen/pelvis with contrast today -infectious disease consults since patient with elevated WBC for help with abxs -Currently on IV zosyn --9/6--CT abdomen with contrast shows  Interval decreased size of previously visualized intra-abdominal fluid collections. No new intra-abdominal fluid collections. Neither residual fluid collection is large enough to consider percutaneous drainage. 2. Persistent periumbilical enterocutaneous fistula. 3. Continued enlargement of pulmonary metastatic lesions in the right lower lobe and lingula. --9/7-- patient afebrile. White count trending down. Tolerating some PO diet. Continuing IV TPN for now and patient will discharged home with TPN according to son Miguel Flores in the room. -- Patient ambulated with physical therapy 125 feet yesterday.  Will try to PT alternating with OT while patient is in-house. -- Wound nurse to see patient regarding issues with pouch leakage and  discussed various options with son. -- Per ID Dr. Steva Flores change to oral Bactrim DS BID and Flagyl 500 BID for seven more days -- per Dr. Dahlia Flores no new surgical recommendation --9/8--no new issues. 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   Moderate protein calorie nutrition --Patient presented on TPN.  -Continue TPN to manage goal rate since family plans on taking patient home on TPN. -Dietitian recommendations appreciated -- electrolytes are ordered by pharmacy per TPM protocol.   Acute metabolic encephalopathy Possibly related to UTI infection. Appears to have resolved.   Urinary tract infection Urine culture (8/26) significant for E. Coli, resistant to ampicillin/ceftriaxone. Patient treated with 7 days of ciprofloxacin IV.   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 Janese Flores-- patient is not a candidate for any further chemo at present per Dr. Janese Flores Pt has been seen by [facility] care   Hyponatremia Mild. Asymptomatic. Thought to be secondary to poor oral intake.   Primary hypertension -Continue losartan   Diabetes mellitus type 2 Polyneuropathy -Continue Semglee, SSI, Lyrica       DVT prophylaxis: SCDs Code Status:   Code Status: Full Code Family Communication: son Miguel Flores on 9/9 Disposition Plan: Discharge home.   Family plans to take patient home once his 24 hour nursing/caregiver is set up which likely is going to be early of next week. Patient will go home with continuous IV TPN.    TOTAL TIME TAKING CARE OF THIS PATIENT: 20 minutes.  >50% time spent on counselling and coordination of care  Note: This dictation was prepared with Dragon dictation along with smaller phrase technology. Any transcriptional errors that result from this process are unintentional.  Miguel Flores M.D    [facility]   CC: Primary care physician; Miguel Body, MD Patient ID: Miguel Flores, male   DOB: 19-Jul-1941, 80 y.o.   MRN: [id number]

## 2021-07-21 LAB — GLUCOSE, CAPILLARY
Glucose-Capillary: 163 mg/dL — ABNORMAL HIGH (ref 70–99)
Glucose-Capillary: 174 mg/dL — ABNORMAL HIGH (ref 70–99)
Glucose-Capillary: 216 mg/dL — ABNORMAL HIGH (ref 70–99)
Glucose-Capillary: 218 mg/dL — ABNORMAL HIGH (ref 70–99)
Glucose-Capillary: 234 mg/dL — ABNORMAL HIGH (ref 70–99)

## 2021-07-21 LAB — BASIC METABOLIC PANEL
Anion gap: 4 — ABNORMAL LOW (ref 5–15)
BUN: 47 mg/dL — ABNORMAL HIGH (ref 8–23)
CO2: 22 mmol/L (ref 22–32)
Calcium: 8 mg/dL — ABNORMAL LOW (ref 8.9–10.3)
Chloride: 107 mmol/L (ref 98–111)
Creatinine, Ser: 0.72 mg/dL (ref 0.61–1.24)
GFR, Estimated: >60 mL/min (ref >60)
Glucose, Bld: 235 mg/dL — ABNORMAL HIGH (ref 70–99)
Potassium: 4.2 mmol/L (ref 3.5–5.1)
Sodium: 133 mmol/L — ABNORMAL LOW (ref 135–145)

## 2021-07-21 LAB — MAGNESIUM: Magnesium: 2 mg/dL (ref 1.7–2.4)

## 2021-07-21 LAB — PHOSPHORUS: Phosphorus: 3.9 mg/dL (ref 2.5–4.6)

## 2021-07-21 MED ORDER — INSULIN GLARGINE-YFGN 100 UNIT/ML ~~LOC~~ SOLN
17.0000 [IU] | Freq: Every day | SUBCUTANEOUS | Status: DC
  Administered 2021-07-21 – 2021-07-24 (×4): 17 [IU] via SUBCUTANEOUS
  Filled 2021-07-21 (×4): qty 0.17

## 2021-07-21 MED ORDER — CHROMIC CHLORIDE 40 MCG/10ML IV SOLN
INTRAVENOUS | Status: AC
  Filled 2021-07-21: qty 1068.5

## 2021-07-21 MED ORDER — INSULIN GLARGINE-YFGN 100 UNIT/ML ~~LOC~~ SOLN
20.0000 [IU] | Freq: Every morning | SUBCUTANEOUS | Status: DC
  Administered 2021-07-21 – 2021-07-24 (×4): 20 [IU] via SUBCUTANEOUS
  Filled 2021-07-21 (×5): qty 0.2

## 2021-07-21 MED ORDER — DEXAMETHASONE 0.5 MG PO TABS
2.0000 mg | ORAL_TABLET | Freq: Every day | ORAL | Status: DC
  Administered 2021-07-21 – 2021-07-23 (×3): 2 mg via ORAL
  Filled 2021-07-21 (×3): qty 4

## 2021-07-21 NOTE — Progress Notes (Signed)
[place] at [place] NAME: Miguel Flores    MR#:  [id number]  DATE OF BIRTH:  1941-01-26  SUBJECTIVE:  no new issues per RN. Patient resting comfortably. No family at bedisde No fever Continues with liquid stool REVIEW OF SYSTEMS:   Review of Systems  Constitutional:  Negative for chills, fever and weight loss.  HENT:  Negative for ear discharge, ear pain and nosebleeds.   Eyes:  Negative for blurred vision, pain and discharge.  Respiratory:  Negative for sputum production, shortness of breath, wheezing and stridor.   Cardiovascular:  Negative for chest pain, palpitations, orthopnea and PND.  Gastrointestinal:  Negative for abdominal pain, diarrhea, nausea and vomiting.  Genitourinary:  Negative for frequency and urgency.  Musculoskeletal:  Negative for back pain and joint pain.  Neurological:  Positive for weakness. Negative for sensory change, speech change and focal weakness.  Psychiatric/Behavioral:  Negative for depression and hallucinations. The patient is not nervous/anxious.   Tolerating Diet: yes Tolerating PT: HHPT  DRUG ALLERGIES:  No Known Allergies  VITALS:  Blood pressure 121/64, pulse 75, temperature 98 F (36.7 C), temperature source Oral, resp. rate 15, height 5' 5.98" (1.676 m), weight 66.2 kg, SpO2 99 %.  PHYSICAL EXAMINATION:   Physical Exam  GENERAL:  80 y.o.-year-old patient lying in the bed with no acute distress.   weak LUNGS: Normal breath sounds bilaterally, no wheezing, rales, rhonchi. No use of accessory muscles of respiration. PORT+ CARDIOVASCULAR: S1, S2 normal. No murmurs, rubs, or gallops.  ABDOMEN: Soft, nontender, nondistended. Ileostomy bag+ EXTREMITIES: No cyanosis, clubbing or edema b/l.    NEUROLOGIC: non focal PSYCHIATRIC:  patient is alert and awake SKIN: No obvious rash, lesion, or ulcer.   LABORATORY PANEL:  CBC Recent Labs  Lab 07/20/21 1553  WBC 16.3*  HGB 9.9*  HCT 30.5*   PLT 233     Chemistries  Recent Labs  Lab 07/18/21 0445 07/21/21 0600  NA 132* 133*  K 4.0 4.2  CL 107 107  CO2 21* 22  GLUCOSE 182* 235*  BUN 42* 47*  CREATININE 0.72 0.72  CALCIUM 8.3* 8.0*  MG 2.0 2.0  AST 18  --   ALT 29  --   ALKPHOS 107  --   BILITOT 0.4  --     Cardiac Enzymes No results for input(s): TROPONINI in the last 168 hours. RADIOLOGY:  No results found. ASSESSMENT AND PLAN:  Miguel Flores is a 80 y.o. 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 Patient presented secondary to Eakin pouch leaking/complications from enterocutaneous fistula. Patient found to have multiple abdominal fluid collections concerning for abscesses and decision initially was to treat conservatively.    Intra-abdominal abscesses CT abd from 8/17 showed --Three larger abscess with multiple small abscesses seen on CT scan from 8/17. Patient has been on antibiotics for UTI. Recently worsened leukocytosis possibly related to abscesses but unsure.  -Repeat CT abdomen/pelvis with contrast today -infectious disease consults since patient with elevated WBC for help with abxs -Currently on IV zosyn --9/6--CT abdomen with contrast shows  Interval decreased size of previously visualized intra-abdominal fluid collections. No new intra-abdominal fluid collections. Neither residual fluid collection is large enough to consider percutaneous drainage. 2. Persistent periumbilical enterocutaneous fistula. 3. Continued enlargement of pulmonary metastatic lesions in the right lower lobe and lingula. --9/7-- patient afebrile. White count trending down. Tolerating some PO diet. Continuing  IV TPN for now and patient will discharged home with TPN according to son Miguel Flores in the room. -- Patient ambulated with physical therapy 125 feet yesterday. Will try to PT alternating  with OT while patient is in-house. -- Wound nurse to see patient regarding issues with pouch leakage and discussed various options with son. -- Per ID Dr. Steva Ready change to oral Bactrim DS BID and Flagyl 500 BID for seven more days -- per Dr. Dahlia Byes no new surgical recommendation --9/8--no new issues. Cont present care and therapy --9/9-- now new issues --9/10--check CBC --9/11--WBC 16K tapering po dexamethasone. No fever. No tachycardia. Patient clinically stable. Discussed with son Miguel Flores will continue to monitor.   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   Moderate protein calorie nutrition --Patient presented on TPN.  -Continue TPN to manage goal rate since family plans on taking patient home on TPN. -Dietitian recommendations appreciated -- electrolytes are ordered by pharmacy per TPM protocol.   Acute metabolic encephalopathy Possibly related to UTI infection. Appears to have resolved.   Urinary tract infection Urine culture (8/26) significant for E. Coli, resistant to ampicillin/ceftriaxone. Patient treated with 7 days of ciprofloxacin IV.   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 [name]-- patient is not a candidate for any further chemo at present per Dr. [name] Pt has been seen by [facility] care   Hyponatremia Mild. Asymptomatic. Thought to be secondary to poor oral intake.   Primary hypertension -Continue losartan   Diabetes mellitus type 2 Polyneuropathy -Continue Semglee, SSI, Lyrica       DVT prophylaxis: SCDs Code Status:   Code Status: Full Code Family Communication: son Miguel Flores on 9/11 Disposition Plan: Discharge home.   Family plans to take patient home once his 24 hour nursing/caregiver is set up which likely is going to be early of next week. Patient will go home with continuous IV TPN.    TOTAL TIME TAKING CARE OF THIS PATIENT: 20 minutes.  >50% time spent on counselling and coordination of  care  Note: This dictation was prepared with Dragon dictation along with smaller phrase technology. Any transcriptional errors that result from this process are unintentional.  Fritzi Mandes M.D    [facility]   CC: Primary care physician; Miguel Body, MD Patient ID: Miguel Flores, male   DOB: [birth year], 80 y.o.   MRN: [id number]

## 2021-07-21 NOTE — Progress Notes (Signed)
PT Cancellation Note  Patient Details Name: Miguel Flores MRN: [id number] DOB: 06-21-1941   Cancelled Treatment:    Reason Eval/Treat Not Completed: Medical issues which prohibited therapy. Ostomy leak. Will check back as time permits.    Rayla Pember A Jamar Casagrande 07/21/2021, 10:44 AM

## 2021-07-21 NOTE — Consult Note (Signed)
PHARMACY - TOTAL PARENTERAL NUTRITION CONSULT NOTE   Indication: Fistula  Patient Measurements: Height: 5' 5.98" (167.6 cm) Weight: 66.2 kg (145 lb 15.1 oz) IBW/kg (Calculated) : 63.76 TPN AdjBW (KG): 74.4 Body mass index is 23.57 kg/m.  Assessment:  Patient is an 80 y/o M with medical history including diabetes, HTN, colon cancer with lung metastases, recent history of admission at OSH for cholecystitis s/p cholecystectomy c/b post-op sepsis. Patient underwent ExLap with findings of small bowel injury which was repaired. 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 He is now presenting to the hospital for ileostomy leakage. Imaging notable for multiple intra-abdominal abscess with contrast extravasation to midline of abdominal wound. General surgery is following.   Pharmacy consulted to continue home TPN while patient is admitted.  Glucose / Insulin: History of controlled diabetes (last Hgb A1c 6.9%). on Dexamethasone 2 mg po daily  BG 151-254 Novolog  SSI-moderate q4 h +  17 units BID glargine (increased to 17 units BID on 07/20/21 )+ Novolog 4 units q4h scheduled Novolog total(SSI +scheduled) per 24 hrs: 55 units  Electrolytes: hyponatremia Na 133, others WNL (pt on lasix 20mg  po daily), on Bactrim K 4.0>4.2 Renal: SCr stable < 1 Hepatic: LFTs, TG wnl Intake / Output; MIVF: no MIVF  GI Imaging: 8/17 CT abdomen / pelvis: Large ventral wound containing small amount of contrast. Enterocutaneous fistula. Multiple intra-abdominal abscess(s). Mesenteric inflammation. Small bilateral pleural effusions with atelectasis or pneumonia at the bases. Metastatic lung nodules, slight increased size of right lower lobe metastatic nodule  GI Surgeries / Procedures:  History of recent cholecystectomy, ExLap at OSH in 05/2021  Central access: Port (see note above) TPN start date: 8/18   Outpatient TPN  information: [facility] in [place], [place] Ph: [phone]  Nutritional Goals: Goal TPN rate 84 mL/hr (provides 106.9 g of protein and 2264 kcals per day)   RD Assessment: Estimated Needs Total Energy Estimated Needs: 2000-2300kcal/day Total Protein Estimated Needs: 100-115g/day Total Fluid Estimated Needs: 2.0-2.3L/day  Current Nutrition:  TPN dysphagia 3 diet, Ensure TID  Plan:  continue TPN at full rate- 84 mL/hr (total volume w/ overfill 2116 mL)  Nutritional components: Amino acids (Travasol 10%): 106.9 grams Lipids (20% SMOF): 70.6 grams Dextrose: 332.6 grams kCal: 2264 / 24h Electrolytes in TPN: Na 90 mEq/L (increase from 85 to 90 meq/L on 07/21/21), KCl 15 mEq/L, Ca 5 mEq/L, Mg 43mEq/L, and Phos 76mmol/L. Cl:Ac 1:1 Na 132>>133  Will slightly increase Na in TPN to 90 mEq/L -Patient on lasix 20 mg PO daily -Patient on Bactrim BID thru 9/13 Add standard MVI and trace elements to TPN Patient on dexamethasone 4 mg PO daily. Current orders:Symglee (insulin glargine) 17 units BID + novolog SSI moderate + 4 units Novolog q4h scheduled Total Novolog units/24 hrs:  55units Will increase insulin glargine to 20 units QAM and continue 17 units at bedtime Monitor TPN labs on Mon/Thurs. Will f/u labs in am   Noralee Space, PharmD Clinical Pharmacist 07/21/2021 9:26 AM

## 2021-07-22 LAB — COMPREHENSIVE METABOLIC PANEL
ALT: 24 U/L (ref 0–44)
AST: 20 U/L (ref 15–41)
Albumin: 2.5 g/dL — ABNORMAL LOW (ref 3.5–5.0)
Alkaline Phosphatase: 82 U/L (ref 38–126)
Anion gap: 3 — ABNORMAL LOW (ref 5–15)
BUN: 49 mg/dL — ABNORMAL HIGH (ref 8–23)
CO2: 23 mmol/L (ref 22–32)
Calcium: 8.2 mg/dL — ABNORMAL LOW (ref 8.9–10.3)
Chloride: 108 mmol/L (ref 98–111)
Creatinine, Ser: 0.6 mg/dL — ABNORMAL LOW (ref 0.61–1.24)
GFR, Estimated: >60 mL/min (ref >60)
Glucose, Bld: 141 mg/dL — ABNORMAL HIGH (ref 70–99)
Potassium: 4 mmol/L (ref 3.5–5.1)
Sodium: 134 mmol/L — ABNORMAL LOW (ref 135–145)
Total Bilirubin: 0.5 mg/dL (ref 0.3–1.2)
Total Protein: 5.5 g/dL — ABNORMAL LOW (ref 6.5–8.1)

## 2021-07-22 LAB — GLUCOSE, CAPILLARY
Glucose-Capillary: 112 mg/dL — ABNORMAL HIGH (ref 70–99)
Glucose-Capillary: 159 mg/dL — ABNORMAL HIGH (ref 70–99)
Glucose-Capillary: 169 mg/dL — ABNORMAL HIGH (ref 70–99)
Glucose-Capillary: 211 mg/dL — ABNORMAL HIGH (ref 70–99)
Glucose-Capillary: 235 mg/dL — ABNORMAL HIGH (ref 70–99)

## 2021-07-22 LAB — MAGNESIUM: Magnesium: 2 mg/dL (ref 1.7–2.4)

## 2021-07-22 LAB — TRIGLYCERIDES: Triglycerides: 54 mg/dL (ref <150)

## 2021-07-22 LAB — PHOSPHORUS: Phosphorus: 3.7 mg/dL (ref 2.5–4.6)

## 2021-07-22 MED ORDER — CHOLESTYRAMINE 4 G PO PACK
4.0000 g | PACK | Freq: Two times a day (BID) | ORAL | Status: DC
  Administered 2021-07-22 – 2021-07-25 (×6): 4 g via ORAL
  Filled 2021-07-22 (×8): qty 1

## 2021-07-22 MED ORDER — TRAVASOL 10 % IV SOLN
INTRAVENOUS | Status: AC
  Filled 2021-07-22: qty 1068.5

## 2021-07-22 MED ORDER — PANTOPRAZOLE SODIUM 40 MG PO TBEC
40.0000 mg | DELAYED_RELEASE_TABLET | Freq: Two times a day (BID) | ORAL | Status: DC
  Administered 2021-07-22 – 2021-07-25 (×7): 40 mg via ORAL
  Filled 2021-07-22 (×7): qty 1

## 2021-07-22 NOTE — Progress Notes (Signed)
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  Pt continues on TPN at full rate. Plan is for pt to discharge home with TPN. Pt is also eating about 25-50% of meals but generally eats around 25%; pt is drinking some Ensure for his pleasure. Refeed labs stable. Per chart, pt's weight has stabilized over the past week since increasing back to goal rate on the TPN. Pt continues to have high fistula output as pt with limited absorptive space.   Medications reviewed and include: aspirin, questran, dexamethasone, marinol, lasix, insulin, imodium, metronidazole, mirtazapine, protonix, psyllium, bactrim   Labs reviewed: Na 134(L), K 4.0 wnl, BUN 49(H), creat 0.60(L), P 3.7 wnl, Mg 2.0 wnl Triglycerides 54 Wbc- 16.3(H), Hgb 9.9(L), Hct 30.5(L) Cbgs- 112, 211, 159, 235 x 24 hrs  Diet Order:     Diet Order             DIET DYS 3 Room service appropriate? Yes; Fluid consistency: Thin  Diet effective now                  EDUCATION NEEDS:   Education needs have been addressed  Skin:  Skin Assessment: Reviewed RN Assessment (midabdominal wound with EC fistula 3.5 x 2.5 x 1 cm)  Last BM:  9/12- type 7  Height:   Ht Readings from Last 1 Encounters:  06/27/21 5' 5.98" (1.676 m)    Weight:   Wt Readings from Last 1 Encounters:  07/22/21 65.6 kg    Ideal Body Weight:  64.5 kg  BMI:  Body mass index is 23.35 kg/m.  Estimated Nutritional Needs:   Kcal:  2000-2300kcal/day  Protein:  100-115g/day  Fluid:  2.0-2.3L/day  Miguel Distance MS, RD, LDN Please refer to [facility] for RD and/or RD on-call/weekend/after hours pager

## 2021-07-22 NOTE — Care Management Important Message (Signed)
Important Message  Patient Details  Name: Miguel Flores MRN: [id number] Date of Birth: 10-Apr-1941   Medicare Important Message Given:  Yes     Dannette Barbara 07/22/2021, 1:02 PM

## 2021-07-22 NOTE — Progress Notes (Signed)
   07/22/21 2100  Enterocutaneous Fistula  Date First Assessed/Time First Assessed: 06/27/21 0130    Peri-fistular Assessment Intact;Erythema;Weeping  Dressing / Containment Device 2 piece  Dressing / Containment Device Interventions Pouch changed;Powder (anti-fungal)  Dressing leaking. Dressing changed per order. Attempted to use the barrier ring. Barrier ring would not stick attempted multiple times. Belt placed and dressing still came loose due to skin weeping. Ostomy placed for 2nd time and gauze placed at base.

## 2021-07-22 NOTE — Progress Notes (Signed)
[place] [facility] [place] NAME: Miguel Flores    MR#:  [id number]  DATE OF BIRTH:  Feb 19, 1941  SUBJECTIVE:  no new issues per RN. Patient resting comfortably. No family at bedisde No fever Continues with liquid stool and intermittent leakage of the ileostomy  bag REVIEW OF SYSTEMS:   Review of Systems  Constitutional:  Negative for chills, fever and weight loss.  HENT:  Negative for ear discharge, ear pain and nosebleeds.   Eyes:  Negative for blurred vision, pain and discharge.  Respiratory:  Negative for sputum production, shortness of breath, wheezing and stridor.   Cardiovascular:  Negative for chest pain, palpitations, orthopnea and PND.  Gastrointestinal:  Negative for abdominal pain, diarrhea, nausea and vomiting.  Genitourinary:  Negative for frequency and urgency.  Musculoskeletal:  Negative for back pain and joint pain.  Neurological:  Positive for weakness. Negative for sensory change, speech change and focal weakness.  Psychiatric/Behavioral:  Negative for depression and hallucinations. The patient is not nervous/anxious.   Tolerating Diet: yes Tolerating PT: HHPT  DRUG ALLERGIES:  No Known Allergies  VITALS:  Blood pressure (!) 123/52, pulse 74, temperature 98.6 F (37 C), resp. rate 18, height 5' 5.98" (1.676 m), weight 65.6 kg, SpO2 100 %.  PHYSICAL EXAMINATION:   Physical Exam  GENERAL:  80 y.o.-year-old patient lying in the bed with no acute distress.   weak LUNGS: Normal breath sounds bilaterally, no wheezing, rales, rhonchi. No use of accessory muscles of respiration. PORT+ CARDIOVASCULAR: S1, S2 normal. No murmurs, rubs, or gallops.  ABDOMEN: Soft, nontender, nondistended. Ileostomy bag+ S/o skin irritation+ EXTREMITIES: No cyanosis, clubbing or edema b/l.    NEUROLOGIC: non focal PSYCHIATRIC:  patient is alert and awake SKIN: No obvious rash, lesion, or ulcer.   LABORATORY PANEL:  CBC Recent Labs  Lab  07/20/21 1553  WBC 16.3*  HGB 9.9*  HCT 30.5*  PLT 233     Chemistries  Recent Labs  Lab 07/22/21 0440  NA 134*  K 4.0  CL 108  CO2 23  GLUCOSE 141*  BUN 49*  CREATININE 0.60*  CALCIUM 8.2*  MG 2.0  AST 20  ALT 24  ALKPHOS 82  BILITOT 0.5    Cardiac Enzymes No results for input(s): TROPONINI in the last 168 hours. RADIOLOGY:  No results found. ASSESSMENT AND PLAN:  Miguel Flores is a 80 y.o. 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 Patient presented secondary to [place] pouch leaking/complications from enterocutaneous fistula. Patient found to have multiple abdominal fluid collections concerning for abscesses and decision initially was to treat conservatively.   Intra-abdominal abscesses CT abd from 8/17 showed --Three larger abscess with multiple small abscesses seen on CT scan from 8/17. Patient has been on antibiotics for UTI. Recently worsened leukocytosis possibly related to abscesses but unsure.  -Repeat CT abdomen/pelvis with contrast today -infectious disease consults since patient with elevated WBC for help with abxs -Currently on IV zosyn --9/6--CT abdomen with contrast shows  Interval decreased size of previously visualized intra-abdominal fluid collections. No new intra-abdominal fluid collections. Neither residual fluid collection is large enough to consider percutaneous drainage. 2. Persistent periumbilical enterocutaneous fistula. 3. Continued enlargement of pulmonary metastatic lesions in the right lower lobe and lingula. --9/7-- patient afebrile. White count trending down. Tolerating some PO diet. Continuing IV TPN for now and patient will discharged home with TPN according to son [name]  in the room. -- Patient ambulated with physical therapy 125 feet yesterday. Will try to PT alternating with OT while patient is  in-house. -- Wound nurse to see patient regarding issues with pouch leakage and discussed various options with son. -- Per ID Dr. Steva Ready change to oral Bactrim DS BID and Flagyl 500 BID for seven more days -- per Dr. Dahlia Byes no new surgical recommendation --9/8--no new issues. Cont present care and therapy --9/9-- now new issues --9/10--check CBC --9/11--WBC 16K tapering po dexamethasone. No fever. No tachycardia. Patient clinically stable. Discussed with son [name] will continue to monitor. --9/12--d/w son [name] regarding subcu shots for slowing down gastric/liquid stools. This is presumed to be subcu Octreotide. Curb sided 2 gastroenterologist. This is given only in the hospital and outpatient will not be approved and very expensive. Recommend questran and increase PPI to BID.  [name] tells me home care arrangement will be midweek likely Wednesday. He will let us know.   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   Moderate protein calorie nutrition --Patient presented on TPN.  -Continue TPN to manage goal rate since family plans on taking patient home on TPN. -Dietitian recommendations appreciated -- electrolytes are ordered by pharmacy per TPM protocol.   Acute metabolic encephalopathy Possibly related to UTI infection. Appears to have resolved.   Urinary tract infection Urine culture (8/26) significant for E. Coli, resistant to ampicillin/ceftriaxone. Patient treated with 7 days of ciprofloxacin IV.   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 [name]-- patient is not a candidate for any further chemo at present per Dr. [name] Pt has been seen by [facility] care   Hyponatremia Mild. Asymptomatic. Thought to be secondary to poor oral intake.   Primary hypertension -Continue losartan   Diabetes mellitus type 2 Polyneuropathy -Continue Semglee, SSI, Lyrica   Overall poor long term prognosis   DVT prophylaxis: SCDs Code  Status:   Code Status: Full Code Family Communication: son [name] on 9/12 Disposition Plan: Discharge home.   Family plans to take patient home once his 24 hour nursing/caregiver is set up which likely is going to be mid week per son [name] Patient will go home with continuous IV TPN.    TOTAL TIME TAKING CARE OF THIS PATIENT: 20 minutes.  >50% time spent on counselling and coordination of care  Note: This dictation was prepared with Dragon dictation along with smaller phrase technology. Any transcriptional errors that result from this process are unintentional.  Fritzi Mandes M.D    [facility]   CC: Primary care physician; [name], MD Patient ID: Miguel Flores, male   DOB: 12-10-1940, 80 y.o.   MRN: [id number]

## 2021-07-22 NOTE — Consult Note (Signed)
PHARMACY - TOTAL PARENTERAL NUTRITION CONSULT NOTE   Indication: Fistula  Patient Measurements: Height: 5' 5.98" (167.6 cm) Weight: 65.6 kg (144 lb 10 oz) IBW/kg (Calculated) : 63.76 TPN AdjBW (KG): 74.4 Body mass index is 23.35 kg/m.  Assessment:  Patient is an 80 y/o M with medical history including diabetes, HTN, colon cancer with lung metastases, recent history of admission at OSH for cholecystitis s/p cholecystectomy c/b post-op sepsis. Patient underwent ExLap with findings of small bowel injury which was repaired. 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 He is now presenting to the hospital for ileostomy leakage. Imaging notable for multiple intra-abdominal abscess with contrast extravasation to midline of abdominal wound. General surgery is following.   Pharmacy consulted to continue home TPN while patient is admitted.  Glucose / Insulin: History of controlled diabetes (last Hgb A1c 6.9%). on Dexamethasone 2 mg po daily  BG 151-254 Novolog  SSI-moderate q4 h +  17 units BID glargine (increased to 17 units BID on 07/20/21 )+ Novolog 4 units q4h scheduled Novolog total(SSI +scheduled) per 24 hrs: 55 units  Electrolytes: hyponatremia Na 133, others WNL (pt on lasix 20mg  po daily), on Bactrim K 4.0>4.2 Renal: SCr stable < 1 Hepatic: LFTs, TG wnl Intake / Output; MIVF: no MIVF  GI Imaging: 8/17 CT abdomen / pelvis: Large ventral wound containing small amount of contrast. Enterocutaneous fistula. Multiple intra-abdominal abscess(s). Mesenteric inflammation. Small bilateral pleural effusions with atelectasis or pneumonia at the bases. Metastatic lung nodules, slight increased size of right lower lobe metastatic nodule  GI Surgeries / Procedures:  History of recent cholecystectomy, ExLap at OSH in 05/2021  Central access: Port (see note above) TPN start date: 8/18   Outpatient TPN information: [facility] in [place], [place] Ph: [phone]  Nutritional Goals: Goal TPN rate 84 mL/hr (provides 106.9 g of protein and 2264 kcals per day)   RD Assessment: Estimated Needs Total Energy Estimated Needs: 2000-2300kcal/day Total Protein Estimated Needs: 100-115g/day Total Fluid Estimated Needs: 2.0-2.3L/day  Current Nutrition:  TPN dysphagia 3 diet, Ensure TID  Plan:  continue TPN at full rate- 84 mL/hr (total volume w/ overfill 2116 mL)  Nutritional components: Amino acids (Travasol 10%): 106.9 grams Lipids (20% SMOF): 70.6 grams Dextrose: 332.6 grams kCal: 2264 / 24h Electrolytes in TPN: Na 90 mEq/L (increase from 85 to 90 meq/L on 07/21/21), KCl 15 mEq/L, Ca 5 mEq/L, Mg 86mEq/L, and Phos 39mmol/L. Cl:Ac 1:1 Na 132>>133>>134 Will continue Na in TPN at 90 mEq/L -Patient on lasix 20 mg PO daily -Patient on Bactrim BID thru 9/13 Add standard MVI and trace elements to TPN Patient on dexamethasone 2 mg PO daily. Current orders: Semglee (insulin glargine) 20 units QAM and 17 units QPM + novolog SSI moderate + 4 units Novolog q4h scheduled Total Novolog units/24 hrs: 39 units BG <180 after adjusting Semglee 9/11 Monitor TPN labs on Mon/Thurs.   Sherilyn Banker, PharmD Clinical Pharmacist 07/22/2021 7:22 AM

## 2021-07-22 NOTE — Progress Notes (Signed)
Ostomy appliance changed x2 this shift for continuous leakage of stool. Skin below ileostomy red and excoriated, antifungal powder applied as needed. Will continue to monitor.

## 2021-07-22 NOTE — Progress Notes (Signed)
Occupational Therapy Treatment Patient Details Name: Miguel Flores MRN: [id number] DOB: 03/05/1941 Today's Date: 07/22/2021   History of present illness 80 y.o. 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 [name] who presented with draining enterocutaneous fistula.     Patient has a complicated recent abdominal surgery history (see Dr. Deniece Ree note from 8/17) that began 6 weeks ago with cholecystitis at a hospital in [facility].  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  He ultimately went home, initially doing well and then started having issues with the ileostomy.   OT comments  Pt. was alert, and pleasant. Pt. education was provided about energy conservation, work simplification strategies for ADLs. Pt. with multiple interruptions during the session. Pt. Continues to benefit from OT services for ADL training, and and to provided further pt./caregiver education about ECS, and work simplification strategies, home modification, and DME. OT discharge recommendation continues to be appropriate.    Recommendations for follow up therapy are one component of a multi-disciplinary discharge planning process, led by the attending physician.  Recommendations may be updated based on patient status, additional functional criteria and insurance authorization.    Follow Up Recommendations  Home health OT;Supervision/Assistance - 24 hour    Equipment Recommendations  3 in 1 bedside commode    Recommendations for Other Services      Precautions / Restrictions Precautions Precautions: Fall Precaution Comments: ostomy (plus needs to wear ostomy belt); L chest port Restrictions Other Position/Activity Restrictions: Ostomy belt on with mobility       Mobility Bed Mobility                General bed mobility comments: deferred    Transfers                 General transfer comment: deferred    Balance                                           ADL either performed or assessed with clinical judgement   ADL   Eating/Feeding: Independent;Set up   Grooming: Set up Grooming Details (indicate cue type and reason): SBA         Upper Body Dressing : Set up   Lower Body Dressing: Set up                       Vision       Perception     Praxis      Cognition Arousal/Alertness: Awake/alert Behavior During Therapy: WFL for tasks assessed/performed Overall Cognitive Status: Within Functional Limits for tasks assessed Area of Impairment: JFK Recovery Scale                   Current Attention Level: Sustained                    Exercises   Shoulder Instructions       General Comments      Pertinent Vitals/ Pain       Pain Assessment: No/denies pain  Home Living  Prior Functioning/Environment              Frequency  Min 1X/week        Progress Toward Goals  OT Goals(current goals can now be found in the care plan section)        Plan Discharge plan remains appropriate;Frequency remains appropriate    Co-evaluation                 AM-PAC OT "6 Clicks" Daily Activity     Outcome Measure   Help from another person eating meals?: None Help from another person taking care of personal grooming?: A Little Help from another person toileting, which includes using toliet, bedpan, or urinal?: A Little Help from another person bathing (including washing, rinsing, drying)?: A Little Help from another person to put on and taking off regular upper body clothing?: A Little Help from another person to put on and taking off regular lower body clothing?: A Little 6 Click Score: 19    End of Session    OT Visit Diagnosis: Other  abnormalities of gait and mobility (R26.89);Muscle weakness (generalized) (M62.81)   Activity Tolerance Patient tolerated treatment well   Patient Left     Nurse Communication          Time: 2353-6144 OT Time Calculation (min): 16 min  Charges: OT General Charges $OT Visit: 1 Visit OT Treatments $Self Care/Home Management : 8-22 mins  Harrel Carina, MS, OTR/L   Harrel Carina 07/22/2021, 5:25 PM

## 2021-07-22 NOTE — Progress Notes (Signed)
Physical Therapy Treatment Patient Details Name: Miguel Flores MRN: [id number] DOB: 04-Mar-1941 Today's Date: 07/22/2021   History of Present Illness 80 y.o. 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 [name] who presented with draining enterocutaneous fistula.     Patient has a complicated recent abdominal surgery history (see Dr. Deniece Ree note from 8/17) that began 6 weeks ago with cholecystitis at a hospital in [facility].  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  He ultimately went home, initially doing well and then started having issues with the ileostomy.    PT Comments    Pt was pleasant and motivated to participate during the session but session was limited to supine therex only secondary to ostomy leakage, nursing notified. No adverse symptoms during the session with SpO2 and HR WNL.  Pt will benefit from HHPT upon discharge to safely address deficits listed in patient problem list for decreased caregiver assistance and eventual return to PLOF.     Recommendations for follow up therapy are one component of a multi-disciplinary discharge planning process, led by the attending physician.  Recommendations may be updated based on patient status, additional functional criteria and insurance authorization.  Follow Up Recommendations  Home health PT;Supervision/Assistance - 24 hour     Equipment Recommendations  Rolling walker with 5" wheels;3in1 (PT)    Recommendations for Other Services       Precautions / Restrictions Precautions Precaution Comments: ostomy (plus needs to wear ostomy belt); L chest port Restrictions Other Position/Activity Restrictions: Ostomy belt on with mobility     Mobility  Bed Mobility               General bed mobility comments: Bed  therex only secondary to ostomy leakage, nursing notified    Transfers                    Ambulation/Gait                 Stairs             Wheelchair Mobility    Modified Rankin (Stroke Patients Only)       Balance                                            Cognition Arousal/Alertness: Awake/alert Behavior During Therapy: WFL for tasks assessed/performed Overall Cognitive Status: Within Functional Limits for tasks assessed                                        Exercises Total Joint Exercises Ankle Circles/Pumps: AROM;Strengthening;Both;5 reps;10 reps (with manual resistance) Quad Sets: Strengthening;Both;5 reps;10 reps Gluteal Sets: Strengthening;Both;5 reps;10 reps Short Arc Quad: Strengthening;Both;10 reps Heel Slides: AROM;Strengthening;Both;10 reps Hip ABduction/ADduction: AROM;Strengthening;Both;10 reps    General Comments        Pertinent Vitals/Pain Pain Assessment: No/denies pain    Home Living                      Prior Function            PT Goals (current  goals can now be found in the care plan section) Progress towards PT goals: PT to reassess next treatment    Frequency    Min 2X/week      PT Plan Current plan remains appropriate    Co-evaluation              AM-PAC PT "6 Clicks" Mobility   Outcome Measure  Help needed turning from your back to your side while in a flat bed without using bedrails?: A Little Help needed moving from lying on your back to sitting on the side of a flat bed without using bedrails?: A Little Help needed moving to and from a bed to a chair (including a wheelchair)?: A Little Help needed standing up from a chair using your arms (e.g., wheelchair or bedside chair)?: A Little Help needed to walk in hospital room?: A Little Help needed climbing 3-5 steps with a railing? : A Little 6 Click Score: 18    End of Session   Activity  Tolerance: Patient tolerated treatment well Patient left: in bed;with call bell/phone within reach;with bed alarm set Nurse Communication: Mobility status;Other (comment) (Ostomy leakage) PT Visit Diagnosis: Muscle weakness (generalized) (M62.81);Difficulty in walking, not elsewhere classified (R26.2)     Time: 6945-0388 PT Time Calculation (min) (ACUTE ONLY): 16 min  Charges:  $Therapeutic Exercise: 8-22 mins                     D. Scott Dheeraj Hail PT, DPT 07/22/21, 1:28 PM

## 2021-07-22 NOTE — Plan of Care (Signed)
  Problem: Clinical Measurements: Goal: Ability to maintain clinical measurements within normal limits will improve Outcome: Progressing Goal: Will remain free from infection Outcome: Progressing Goal: Diagnostic test results will improve Outcome: Progressing Goal: Respiratory complications will improve Outcome: Progressing Goal: Cardiovascular complication will be avoided Outcome: Progressing   Problem: Pain Managment: Goal: General experience of comfort will improve Outcome: Progressing   Pt is alert and oriented to person only. V/S stable. Ostomy leaking. Skin around the ostomy and abdominal folds is excoriated. Cleansed and powder applied. Ostomy pouching system changed 3x this shift. Slept well all night.

## 2021-07-23 LAB — GLUCOSE, CAPILLARY
Glucose-Capillary: 118 mg/dL — ABNORMAL HIGH (ref 70–99)
Glucose-Capillary: 142 mg/dL — ABNORMAL HIGH (ref 70–99)
Glucose-Capillary: 156 mg/dL — ABNORMAL HIGH (ref 70–99)
Glucose-Capillary: 160 mg/dL — ABNORMAL HIGH (ref 70–99)
Glucose-Capillary: 169 mg/dL — ABNORMAL HIGH (ref 70–99)
Glucose-Capillary: 202 mg/dL — ABNORMAL HIGH (ref 70–99)
Glucose-Capillary: 290 mg/dL — ABNORMAL HIGH (ref 70–99)

## 2021-07-23 MED ORDER — TRAVASOL 10 % IV SOLN
INTRAVENOUS | Status: AC
  Filled 2021-07-23: qty 1068.5

## 2021-07-23 NOTE — Progress Notes (Signed)
Ostomy wound care provided with son [name]) at bedside who was giving specific instructions on how to care for stoma.   Washed w/wash cloth and "only" water to area... applied stoma powder and stoma spray son brought from home per his request.... Small piece of ring barrier was used at top per son's instructions, x1 barrier ring "U" shaped used at bottom of stoma, x1 barrier ring upside "U" used over top of stoma and over small piece of barrier ring... attached base and bag... Son at bedside and assisted w/dressing.   Pt is back in bed resting.

## 2021-07-23 NOTE — Progress Notes (Signed)
Pouch is leaking around ... reinforced dressing on the outside and will change ostomy dressing.

## 2021-07-23 NOTE — Plan of Care (Signed)
Pt son visiting this past past evening. He is concerned regarding his fathers abdomen is red and excoriated like at home. No improvements in frequently changing ostomy dressing. Requesting consult for wound/ostomy for new dressing or supplies. Ostomy changed x 3 during overnight. Msg left for Dr. Posey Pronto regarding sons concerns.  Problem: Elimination: Goal: Will not experience complications related to bowel motility Outcome: Not Progressing   Problem: Skin Integrity: Goal: Risk for impaired skin integrity will decrease Outcome: Not Progressing   Problem: Education: Goal: Knowledge of General Education information will improve Description: Including pain rating scale, medication(s)/side effects and non-pharmacologic comfort measures Outcome: Progressing   Problem: Health Behavior/Discharge Planning: Goal: Ability to manage health-related needs will improve Outcome: Progressing   Problem: Clinical Measurements: Goal: Ability to maintain clinical measurements within normal limits will improve Outcome: Progressing Goal: Will remain free from infection Outcome: Progressing Goal: Diagnostic test results will improve Outcome: Progressing Goal: Respiratory complications will improve Outcome: Progressing Goal: Cardiovascular complication will be avoided Outcome: Progressing   Problem: Activity: Goal: Risk for activity intolerance will decrease Outcome: Progressing   Problem: Nutrition: Goal: Adequate nutrition will be maintained Outcome: Progressing   Problem: Coping: Goal: Level of anxiety will decrease Outcome: Progressing   Problem: Elimination: Goal: Will not experience complications related to urinary retention Outcome: Progressing   Problem: Pain Managment: Goal: General experience of comfort will improve Outcome: Progressing   Problem: Safety: Goal: Ability to remain free from injury will improve Outcome: Progressing

## 2021-07-23 NOTE — Consult Note (Signed)
PHARMACY - TOTAL PARENTERAL NUTRITION CONSULT NOTE   Indication: Fistula  Patient Measurements: Height: 5' 5.98" (167.6 cm) Weight: 67.6 kg (149 lb 0.5 oz) IBW/kg (Calculated) : 63.76 TPN AdjBW (KG): 74.4 Body mass index is 24.07 kg/m.  Assessment:  Patient is an 80 y/o M with medical history including diabetes, HTN, colon cancer with lung metastases, recent history of admission at OSH for cholecystitis s/p cholecystectomy c/b post-op sepsis. Patient underwent ExLap with findings of small bowel injury which was repaired. 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 He is now presenting to the hospital for ileostomy leakage. Imaging notable for multiple intra-abdominal abscess with contrast extravasation to midline of abdominal wound. General surgery is following.   Pharmacy consulted to continue home TPN while patient is admitted.  Glucose / Insulin: History of controlled diabetes (last Hgb A1c 6.9%). on Dexamethasone 2 mg po daily  BG 151-254 Novolog  SSI-moderate q4 h +  17 units BID glargine (increased to 17 units BID on 07/20/21 )+ Novolog 4 units q4h scheduled Novolog total(SSI +scheduled) per 24 hrs: 55 units  Electrolytes: hyponatremia Na 133, others WNL (pt on lasix 20mg  po daily), on Bactrim K 4.0>4.2 Renal: SCr stable < 1 Hepatic: LFTs, TG wnl Intake / Output; MIVF: no MIVF  GI Imaging: 8/17 CT abdomen / pelvis: Large ventral wound containing small amount of contrast. Enterocutaneous fistula. Multiple intra-abdominal abscess(s). Mesenteric inflammation. Small bilateral pleural effusions with atelectasis or pneumonia at the bases. Metastatic lung nodules, slight increased size of right lower lobe metastatic nodule  GI Surgeries / Procedures:  History of recent cholecystectomy, ExLap at OSH in 05/2021  Central access: Port (see note above) TPN start date: 8/18   Outpatient TPN  information: [facility] in [place], [place] Ph: [phone]  Nutritional Goals: Goal TPN rate 84 mL/hr (provides 106.9 g of protein and 2264 kcals per day)   RD Assessment: Estimated Needs Total Energy Estimated Needs: 2000-2300kcal/day Total Protein Estimated Needs: 100-115g/day Total Fluid Estimated Needs: 2.0-2.3L/day  Current Nutrition:  TPN dysphagia 3 diet, Ensure TID  Plan:  continue TPN at full rate- 84 mL/hr (total volume w/ overfill 2116 mL)  Nutritional components: Amino acids (Travasol 10%): 106.9 grams Lipids (20% SMOF): 70.6 grams Dextrose: 332.6 grams kCal: 2264 / 24h Electrolytes in TPN: Na 90 mEq/L (increase from 85 to 90 meq/L on 07/21/21), KCl 15 mEq/L, Ca 5 mEq/L, Mg 66mEq/L, and Phos 81mmol/L. Cl:Ac 1:1 Na 132>>133>>134 Will continue Na in TPN at 90 mEq/L -Patient on lasix 20 mg PO daily -Patient on Bactrim BID thru 9/13 Add standard MVI and trace elements to TPN Patient on dexamethasone 2 mg PO daily. Current orders: Semglee (insulin glargine) 20 units QAM and 17 units QPM + novolog SSI moderate + 4 units Novolog q4h scheduled Total Novolog units/24 hrs: 39 units BG <180 after adjusting Semglee 9/11 Monitor TPN labs on Mon/Thurs.   Sherilyn Banker, PharmD Clinical Pharmacist 07/23/2021 7:26 AM

## 2021-07-23 NOTE — Progress Notes (Signed)
[facility] [place] NAME: Miguel Flores    MR#:  [id number]  DATE OF BIRTH:  [birth year]  SUBJECTIVE:  no new issues per RN. Patient resting comfortably. No family at bedisde No fever Continues with liquid stool and intermittent leakage of the ostomy  bag Pt desires to go home and asking me when that can happen REVIEW OF SYSTEMS:   Review of Systems  Constitutional:  Positive for malaise/fatigue and weight loss. Negative for chills and fever.  HENT:  Negative for ear discharge, ear pain and nosebleeds.   Eyes:  Negative for blurred vision, pain and discharge.  Respiratory:  Negative for sputum production, shortness of breath, wheezing and stridor.   Cardiovascular:  Negative for chest pain, palpitations, orthopnea and PND.  Gastrointestinal:  Negative for abdominal pain, diarrhea, nausea and vomiting.  Genitourinary:  Negative for frequency and urgency.  Musculoskeletal:  Negative for back pain and joint pain.  Neurological:  Positive for weakness. Negative for sensory change, speech change and focal weakness.  Psychiatric/Behavioral:  Positive for depression. Negative for hallucinations. The patient is nervous/anxious.   Tolerating Diet: very little Tolerating PT: HHPT  DRUG ALLERGIES:  No Known Allergies  VITALS:  Blood pressure (!) 108/51, pulse 84, temperature 97.9 F (36.6 C), temperature source Oral, resp. rate 18, height 5' 5.98" (1.676 m), weight 67.6 kg, SpO2 99 %.  PHYSICAL EXAMINATION:   Physical Exam  GENERAL:  80 y.o.-year-old patient lying in the bed with no acute distress.   Weak, tearful LUNGS: Normal breath sounds bilaterally, no wheezing, rales, rhonchi. No use of accessory muscles of respiration. PORT+ CARDIOVASCULAR: S1, S2 normal. No murmurs, rubs, or gallops.  ABDOMEN: Soft, nontender, nondistended. ostomy bag+ S/o skin irritation+ EXTREMITIES: No cyanosis, clubbing or edema b/l.    NEUROLOGIC: non  focal PSYCHIATRIC:  patient is alert and awake SKIN: as above  LABORATORY PANEL:  CBC Recent Labs  Lab 07/20/21 1553  WBC 16.3*  HGB 9.9*  HCT 30.5*  PLT 233     Chemistries  Recent Labs  Lab 07/22/21 0440  NA 134*  K 4.0  CL 108  CO2 23  GLUCOSE 141*  BUN 49*  CREATININE 0.60*  CALCIUM 8.2*  MG 2.0  AST 20  ALT 24  ALKPHOS 82  BILITOT 0.5    Cardiac Enzymes No results for input(s): TROPONINI in the last 168 hours. RADIOLOGY:  No results found. ASSESSMENT AND PLAN:  Miguel Flores is a 80 y.o. 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 Patient presented secondary to [place] pouch leaking/complications from enterocutaneous fistula. Patient found to have multiple abdominal fluid collections concerning for abscesses and decision initially was to treat conservatively.   Intra-abdominal abscesses CT abd from 8/17 showed --Three larger abscess with multiple small abscesses seen on CT scan from 8/17. Patient has been on antibiotics for UTI. Recently worsened leukocytosis possibly related to abscesses but unsure.  -Repeat CT abdomen/pelvis with contrast today -infectious disease consults since patient with elevated WBC for help with abxs -Currently on IV zosyn --9/6--CT abdomen with contrast shows  Interval decreased size of previously visualized intra-abdominal fluid collections. No new intra-abdominal fluid collections. Neither residual fluid collection is large enough to consider percutaneous drainage.2. Persistent periumbilical enterocutaneous fistula. 3. Continued enlargement of pulmonary metastatic lesions in the right lower lobe and lingula. --9/7-- patient afebrile. White count trending down. Tolerating some PO diet. Continuing  IV TPN for now and patient will discharged home with TPN according to son [name] in the room. -- Patient  ambulated with physical therapy 125 feet yesterday. Will try to PT alternating with OT while patient is in-house. -- Wound nurse to see patient regarding issues with pouch leakage and discussed various options with son. -- Per ID Dr. Steva Ready change to oral Bactrim DS BID and Flagyl 500 BID for seven more days -- per Dr. Dahlia Byes no new surgical recommendation --9/8--no new issues. Cont present care and therapy --9/9-- now new issues --9/10--check CBC --9/11--WBC 16K tapering po dexamethasone. Will d/c in couple days No fever. No tachycardia. Patient clinically stable. Discussed with son [name] will continue to monitor. --9/12--d/w son [name] regarding subcu shots for slowing down gastric/liquid stools. This is presumed to be subcu Octreotide. Curb sided 2 gastroenterologist. This is given only in the hospital and outpatient will not be approved and very expensive. Recommend questran and increase PPI to BID.  [name] tells me home care arrangement will be midweek likely Wednesday. He will let us know. --9/13-- d/w  son Unit director [name], Asst director [name] and wound nurse in conference room regarding ongoing issues with Ostomy leak, dressing changes, PT/OT. Listened to the concerns [name] had and explained different possibilities. [name] was appreciative. He is working with [facility] to get private staff for ostomy change.  Chronic Enterocutaneous fistula with ostomy bag Present on admission.   Moderate protein calorie nutrition --Patient presented on TPN.  -Continue TPN to manage goal rate since family plans on taking patient home on TPN. -Dietitian recommendations appreciated -- electrolytes are ordered by pharmacy per TPN protocol.   Acute metabolic encephalopathy Possibly related to UTI infection. Appears to have resolved.   Urinary tract infection Urine culture (8/26) significant for E. Coli, resistant to ampicillin/ceftriaxone.  -Patient treated with 7 days of ciprofloxacin IV.   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 [name]-- patient is not a candidate for any further chemo at present per Dr. [name] Pt has been seen by [facility] care   Hyponatremia Mild. Asymptomatic. Thought to be secondary to poor oral intake. Na 134   Primary hypertension -Continue losartan and lasix   Diabetes mellitus type 2 Polyneuropathy -Continue Semglee, SSI, Lyrica   Overall poor long term prognosis   DVT prophylaxis: SCDs Code Status:   Code Status: Full Code Family Communication: son [name] on 9/13 Disposition Plan: Discharge home.   Family plans to take patient home once his 24 hour nursing/caregiver is set up  per son [name] Patient will go home with continuous IV TPN.    TOTAL TIME TAKING CARE OF THIS PATIENT: 68minutes.  >50% time spent on counselling and coordination of care  Note: This dictation was prepared with Dragon dictation along with smaller phrase technology. Any transcriptional errors that result from this process are unintentional.  Fritzi Mandes M.D    [facility]   CC: Primary care physician; [name], MD Patient ID: [name], male   DOB: 09-Mar-1941, 80 y.o.   MRN: [id number]

## 2021-07-23 NOTE — Progress Notes (Signed)
Patient son Miguel Flores, came to Surveyor, quantity office stating that he and his brother will be taking Mr.  Puertas home on Thursday 9/15.  The brother and Miguel Flores have arranged home health and have inquired to make sure case manager has [employer] to make sure the patient TPN is delivered to the house on 9/15. Surveyor, quantity to follow up with case manager on 9/14. Son Miguel Flores is aware that follow up with case manager for home health needs will be tomorrow 9/14.

## 2021-07-23 NOTE — Progress Notes (Signed)
Assessed ostomy Fistula site. No leakage around dressing. Drainage in bag. Will continue to monitor.

## 2021-07-23 NOTE — TOC Progression Note (Signed)
Transition of Care [facility]) - Progression Note    Patient Details  Name: CLABORN JANUSZ MRN: [id number] Date of Birth: 1941-03-18  Transition of [facility] CM/SW Contact  Eileen Stanford, LCSW Phone Number: 07/23/2021, 1:20 PM  Clinical Narrative:   CSW spoke with [facility] and they state they were prepared to provide services to pt when pt was recently medically stable and then plans changed so they are reconfiguring now. RN will call pt back this evening with update. They are actively working on it.     Expected Discharge Plan:  (TBD) Barriers to Discharge: Continued Medical Work up  Expected Discharge Plan and Services Expected Discharge Plan:  (TBD)     Post Acute Care Choice:  (TBD) Living arrangements for the past 2 months: Single Family Home                                       Social Determinants of Health (SDOH) Interventions    Readmission Risk Interventions No flowsheet data found.

## 2021-07-23 NOTE — Progress Notes (Signed)
Unable to do ostomy dressing d/t barrier rings on back order per nurse secretary .... reinforced dressing on the outside

## 2021-07-23 NOTE — Progress Notes (Signed)
Occupational Therapy Treatment Patient Details Name: Miguel Flores MRN: [id number] DOB: 07/09/1941 Today's Date: 07/23/2021   History of present illness 80 y.o. 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     Patient has a complicated recent abdominal surgery history (see Miguel Flores note from 8/17) that began 6 weeks ago with cholecystitis at a hospital in [facility].  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  He ultimately went home, initially doing well and then started having issues with the ileostomy.   OT comments  Miguel Flores was seen for OT treatment on this date. Upon arrival to room pt reclined in bed, noted to have full ostomy bag and RN in room to drain prior to initiating mobility. Pt completed supine UE therex as described below. Pt requires SETUP for UBD seated EOB. SBA + RW for ADL t/f and ~80 ft mobility (bleeding noted during mobility, returned to chair with RN in room to assess rawness at ostomy site). Pt left in chair with lunch setup, visitors at bedside. Pt making good progress toward goals. Pt continues to benefit from skilled OT services to maximize return to PLOF and minimize risk of future falls, injury, caregiver burden, and readmission. Will continue to follow POC. Discharge recommendation remains appropriate.     Recommendations for follow up therapy are one component of a multi-disciplinary discharge planning process, led by the attending physician.  Recommendations may be updated based on patient status, additional functional criteria and insurance authorization.    Follow Up Recommendations  Home health OT;Supervision/Assistance - 24 hour    Equipment Recommendations  3 in 1 bedside commode     Recommendations for Other Services      Precautions / Restrictions Precautions Precautions: Fall Precaution Comments: ostomy (plus needs to wear ostomy belt); L chest port Restrictions Weight Bearing Restrictions: No       Mobility Bed Mobility Overal bed mobility: Needs Assistance Bed Mobility: Supine to Sit     Supine to sit: Supervision;HOB elevated          Transfers Overall transfer level: Needs assistance Equipment used: Rolling walker (2 wheeled) Transfers: Sit to/from Stand Sit to Stand: Min guard              Balance Overall balance assessment: Needs assistance Sitting-balance support: Feet supported Sitting balance-Leahy Scale: Normal     Standing balance support: During functional activity;Single extremity supported Standing balance-Leahy Scale: Good                             ADL either performed or assessed with clinical judgement   ADL Overall ADL's : Needs assistance/impaired                                       General ADL Comments: SETUP for UBD seated EOB. SBA + RW for ADL t/f and ~80 ft mobility (bleeding noted during mobility, returned to chair with RN in room to assess rawness at ostomy site).      Cognition Arousal/Alertness: Awake/alert Behavior During Therapy: WFL for tasks assessed/performed Overall Cognitive Status: Within Functional Limits for tasks assessed  Exercises Exercises: Other exercises Other Exercises Other Exercises: Pt educated re: falls prevention, HEP Other Exercises: UBD, sup>sit, sit<>stand, sitting/standing balance/tolerance, ~80 ft mobility, BUE supine overhead press 1 set x 20 reps           Pertinent Vitals/ Pain       Pain Assessment: No/denies pain         Frequency  Min 1X/week        Progress Toward Goals  OT Goals(current goals can now be found in the care plan section)  Progress towards OT  goals: Progressing toward goals  Acute Rehab OT Goals Patient Stated Goal: to walk more OT Goal Formulation: With patient/family Time For Goal Achievement: 08/03/21 Potential to Achieve Goals: Good ADL Goals Pt Will Perform Grooming: with modified independence;standing Pt Will Perform Lower Body Dressing: with modified independence;sit to/from stand Pt Will Transfer to Toilet: with modified independence;ambulating;regular height toilet  Plan Discharge plan remains appropriate;Frequency remains appropriate    Co-evaluation                 AM-PAC OT "6 Clicks" Daily Activity     Outcome Measure   Help from another person eating meals?: None Help from another person taking care of personal grooming?: A Little Help from another person toileting, which includes using toliet, bedpan, or urinal?: A Little Help from another person bathing (including washing, rinsing, drying)?: A Little Help from another person to put on and taking off regular upper body clothing?: A Little Help from another person to put on and taking off regular lower body clothing?: A Little 6 Click Score: 19    End of Session    OT Visit Diagnosis: Other abnormalities of gait and mobility (R26.89);Muscle weakness (generalized) (M62.81)   Activity Tolerance Patient tolerated treatment well   Patient Left in chair;with call bell/phone within reach;with chair alarm set;with family/visitor present   Nurse Communication Other (comment) (ostomoy site bleeding)        Time: 8032-1224 OT Time Calculation (min): 14 min  Charges: OT General Charges $OT Visit: 1 Visit OT Treatments $Self Care/Home Management : 8-22 mins  Miguel Flores, M.S. OTR/L  07/23/21, 1:24 PM  ascom [phone]

## 2021-07-24 LAB — GLUCOSE, CAPILLARY
Glucose-Capillary: 134 mg/dL — ABNORMAL HIGH (ref 70–99)
Glucose-Capillary: 137 mg/dL — ABNORMAL HIGH (ref 70–99)
Glucose-Capillary: 153 mg/dL — ABNORMAL HIGH (ref 70–99)
Glucose-Capillary: 154 mg/dL — ABNORMAL HIGH (ref 70–99)
Glucose-Capillary: 175 mg/dL — ABNORMAL HIGH (ref 70–99)
Glucose-Capillary: 189 mg/dL — ABNORMAL HIGH (ref 70–99)
Glucose-Capillary: 236 mg/dL — ABNORMAL HIGH (ref 70–99)
Glucose-Capillary: 71 mg/dL (ref 70–99)

## 2021-07-24 MED ORDER — CHROMIC CHLORIDE 40 MCG/10ML IV SOLN
INTRAVENOUS | Status: DC
  Filled 2021-07-24: qty 1068.5

## 2021-07-24 NOTE — Progress Notes (Signed)
Physical Therapy Treatment Patient Details Name: Miguel Flores MRN: [id number] DOB: [birth year] Today's Date: 07/24/2021   History of Present Illness 80 y.o. 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 [name] who presented with draining enterocutaneous fistula.     Patient has a complicated recent abdominal surgery history (see Dr. Deniece Ree note from 8/17) that began 6 weeks ago with cholecystitis at a hospital in [facility].  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  He ultimately went home, initially doing well and then started having issues with the ileostomy.    PT Comments    Pt was pleasant and motivated to participate during the session and put forth good effort throughout. Pt was steady with transfers and gait and was able to amb 150' with no adverse symptoms.  Pt showed good carryover with log roll training and required no physical assistance during the session.  Pt's SpO2 and HR were both WNL during the session.  Pt will benefit from HHPT upon discharge to safely address deficits listed in patient problem list for decreased caregiver assistance and eventual return to PLOF.     Recommendations for follow up therapy are one component of a multi-disciplinary discharge planning process, led by the attending physician.  Recommendations may be updated based on patient status, additional functional criteria and insurance authorization.  Follow Up Recommendations  Home health PT;Supervision/Assistance - 24 hour     Equipment Recommendations  Rolling walker with 5" wheels;3in1 (PT)    Recommendations for Other Services       Precautions / Restrictions Precautions Precautions: Fall Precaution Comments: ostomy (plus needs to wear ostomy belt); L chest port Restrictions Other  Position/Activity Restrictions: Ostomy belt on with mobility     Mobility  Bed Mobility Overal bed mobility: Needs Assistance Bed Mobility: Rolling;Sidelying to Sit;Sit to Sidelying Rolling: Supervision Sidelying to sit: Supervision       General bed mobility comments: Min cues for log roll training/sequencing    Transfers Overall transfer level: Needs assistance Equipment used: Rolling walker (2 wheeled)   Sit to Stand: Min guard         General transfer comment: Fair to good eccentric and concentric control  Ambulation/Gait Ambulation/Gait assistance: Min guard Gait Distance (Feet): 150 Feet Assistive device: Rolling walker (2 wheeled) Gait Pattern/deviations: Step-through pattern;Decreased step length - right;Decreased step length - left Gait velocity: decreased   General Gait Details: Slow cadence but steady without LOB or adverse symptoms, SpO2 and HR WNL   Stairs             Wheelchair Mobility    Modified Rankin (Stroke Patients Only)       Balance Overall balance assessment: Needs assistance Sitting-balance support: Feet supported Sitting balance-Leahy Scale: Normal     Standing balance support: During functional activity;Bilateral upper extremity supported Standing balance-Leahy Scale: Good                              Cognition Arousal/Alertness: Awake/alert Behavior During Therapy: WFL for tasks assessed/performed Overall Cognitive Status: Within Functional Limits for tasks assessed  Exercises Total Joint Exercises Ankle Circles/Pumps: AROM;Strengthening;Both;10 reps Quad Sets: Strengthening;Both;10 reps Gluteal Sets: Strengthening;Both;10 reps Long Arc Quad: AROM;Strengthening;Both;10 reps Knee Flexion: AROM;Strengthening;Both;10 reps Marching in Standing: AROM;Strengthening;Both;5 reps;Standing    General Comments        Pertinent Vitals/Pain Pain  Assessment: No/denies pain    Home Living                      Prior Function            PT Goals (current goals can now be found in the care plan section) Progress towards PT goals: Progressing toward goals    Frequency    Min 2X/week      PT Plan Current plan remains appropriate    Co-evaluation              AM-PAC PT "6 Clicks" Mobility   Outcome Measure  Help needed turning from your back to your side while in a flat bed without using bedrails?: A Little Help needed moving from lying on your back to sitting on the side of a flat bed without using bedrails?: A Little Help needed moving to and from a bed to a chair (including a wheelchair)?: A Little Help needed standing up from a chair using your arms (e.g., wheelchair or bedside chair)?: A Little Help needed to walk in hospital room?: A Little Help needed climbing 3-5 steps with a railing? : A Little 6 Click Score: 18    End of Session Equipment Utilized During Treatment: Gait belt;Other (comment) (Place gently between ostomy and chest port) Activity Tolerance: Patient tolerated treatment well Patient left: in chair;with chair alarm set;with call bell/phone within reach;with family/visitor present Nurse Communication: Mobility status PT Visit Diagnosis: Muscle weakness (generalized) (M62.81);Difficulty in walking, not elsewhere classified (R26.2)     Time: 9417-4081 PT Time Calculation (min) (ACUTE ONLY): 24 min  Charges:  $Gait Training: 8-22 mins $Therapeutic Exercise: 8-22 mins                     D. Scott Buddy Loeffelholz PT, DPT 07/24/21, 4:20 PM

## 2021-07-24 NOTE — Consult Note (Signed)
[place] Nurse wound follow up Patient receiving care in [facility] 225. Surveyor, quantity T. Madalyn Rob, and Camera operator, Lisabeth Pick, at bedside for [facility] Fistula pouch change process teaching.  They observed all steps involved in the care. Wound type: The patient has an abdominal wound and at the very most distal area of the wound the patient has a stomatized fistula.  Of note, the EC fistula present on admission, was not stomatized at that time--today it is stomatized and readily identifiable. Measurement: NA Wound bed: Bright red Drainage (amount, consistency, odor) EC fistula effluent--full fluid, no chunks of food or medications.  The patient is, and has been, receiving a diet and fluids throughout this hospitalization. Periwound: Impacted by leakage at the inferior border of the wound where the fistula is.  This was a constant problem at home, per the patient's son, [name].  It has remained a problem throughout the patient's hospitalization.  It will remain a problem for this patient due to the contour of the abdomen, the liquid nature of the EC effluent, and probably the greatest contributor--the location of the fistula. Also, [name] has expressed that his father not remain on bedrest for a pouch to be connected to wall suction.  He was very concerned this would limit his father's mobility strengthening with PT.  As expected, when the Eakin pouch was tried very soon after the patient's admission, and it was disconnected from suction to mobilize the patient with PT, the Eakin pouch immediately began to leak. My orders currently in the EMR outline the pouching system I found to be effective if all steps were followed.  I understand from speaking with the primary nurses, that the moment the patient becomes mobile the pouch leaks.  I have suggested that when the patient is to be mobilized, the patient could be provided an incontinent brief, mobilize the patient, return the patient to bed, and the nurse be prepared to change the  pouching system as soon as possible after PT mobilization to minimize exposure to the high enzyme containing EC effluent. Dressing procedure/placement/frequency:  Today I changed the pouching system, cleansed with water only, applied 2 layers of stoma powder, crusting with skin film barrier.  Used 2 concentrically placed barrier rings at the bottom of the wound, and one at the top.  I cut the skin barrier opening to 1.5 inches and placed over the prepared site; the pouch was attached and the belt was made a bit snugger by creating a loop in it.  The area of skin under the loop was protected with 4 x 4 dry gauze.  At the completion of the process I requested the patient remain with head down for 15 minutes to give opportunity for all components to have enhanced opportunity to seal together.  The patient tolerated well.  Step by step instructions have been placed on the white board by the Assistant Unit Director.  I also communicated with Tonya in Materials Management and requested obtainment of 2 3/4 inch pouches, 2 3/4 inch skin barriers, barrier rings, ostomy belt, and stoma powder from the off site warehouse.  She has agreed to do so.  The difficulty of the pouching situation makes for frequent pouching system changes and therefore high use of all products.  Of note, yesterday at 2 pm in the Executive Boardroom #2 a joint meeting was lead out by Dr. Serita Grit, Unit Director M. Moon, Occupational hygienist T. Madalyn Rob, the patient's son [name] (his other son was working an unavailable to attend),  and myself.  At that time [name] was encouraged to voice all concerns related to his father's care.  It appeared [place] freely did so.  During our conversation, I reiterated the following facts and items of discussion I had had with [name] on previous encounters.  What many have been referring to as an ileostomy is not a surgically created ileostomy, it is a wound with an enterocutaneous fistula that is spilling small bowel  contents out.  EC fistulas contain high amounts of enzymes to breakdown food, unfortunately, the enzymes also breakdown skin and tissue that are exposed to them.  His father's fistula is in one of the worst locations it could possibly be in--at the bottom of the wound--which contributes to the liquid feces going under every pouching system we have tried, and that the family tried at home prior to the patient's admission to our hospital.  [name] expressed a desire, as he has done previously, that the pouch last 2 days.  I again explained that I would consider it a success if there is not leakage from the pouching system for 4 hours; it is unrealistic to expect a pouching system to last for 2 days in this very challenging pouching situation.  We all explored any opportunities for SNF placement, LTAC placement, Hospice involvement--all are not options.  We discussed asking the patient what he wants.  Would he want to stop TPN and go home to eat and drink what he can, and be in the comfort of his home, enjoying visits from family and friends?  [name] volunteered he had been told by a physician that his father may live until Thanksgiving, but not much longer, if at all.  [name] expressed concern that if open discussions were had with his father, that his father would "give up".  I asked if there were community citizens or friends that might could be taught how to provide pouching care, and maybe this could help facilitate discharge.  His father wants desperately to go home.  I learned from reviewing the chart this morning that the sons are taking the patient home Thursday, as they have secured home care for him.  I have nothing further to add to the POC.  I have used products within the [place] to attempt to contain the EC fistula effluent to the greatest extent possible. [name] knows the step by step processes involved in pouch changing--he is very knowledgeable of these.  I have asked the Assistant Director to  provide some of all pouching supplies at time of discharge.  She has agreed to do so.  Between yesterday and today, 2 hours were used in completing these patient care needs.  Discussed plan of care with the patient and bedside nurse.  [place] nurse will not follow at this time.  Please re-consult the [place] team if needed.  Val Riles, RN, MSN, CWOCN, CNS-BC, pager [phone]

## 2021-07-24 NOTE — Progress Notes (Addendum)
Progress Note    Miguel Flores  OIZ:[id number] DOB: 10/28/1941  DOA: 06/26/2021 PCP: [name], MD      Brief Narrative:    Medical records reviewed and are as summarized below:  Miguel Flores is a 80 y.o. 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  He presented to the hospital because of aching pouch leakage/complications from enterocutaneous fistula.  CT abdomen and pelvis showed multiple abdominal fluid collections concerning for intra-abdominal abscesses.      Assessment/Plan:   Principal Problem:   Ileostomy dysfunction (HCC) Active Problems:   Essential hypertension   Type 2 diabetes mellitus with hyperlipidemia (HCC)   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HCC)   Lung metastases (HCC)   Post cholecystectomy intraabdominal abscess   S/P cholecystectomy   On total parenteral nutrition (TPN)   Malnutrition of moderate degree   Overweight (BMI 25.0-29.9)   Enterocutaneous fistula   Anemia of chronic disease   Hyponatremia   Cough   Pleural effusion   Acute metabolic encephalopathy   Palliative care encounter   Nutrition Problem: Moderate Malnutrition Etiology: cancer and cancer related treatments  Signs/Symptoms: moderate fat depletion, moderate muscle depletion, severe muscle depletion   Body mass index is 24.24 kg/m.   Intra-abdominal abscesses: Repeat CT abdomen and pelvis on 07/16/2021 showed interval decrease in size of previously visualized intra-abdominal fluid collections.  Per radiologist report, there was no fluid collection that was large enough to consider percutaneous drainage.  He was initially treated with empiric IV antibiotics.  He was subsequently switched to oral Bactrim and Flagyl which he completed on 07/24/2021.  Chronic enterocutaneous fistula with ostomy bag: Present on admission.   Appreciate wound care nurses assistance in trying to help manage this complex situation.  Acute metabolic encephalopathy: Improved  Acute E. 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 [name].  Reportedly, patient is not a candidate for further chemotherapy.  Chronic hyponatremia: Asymptomatic   Diet Order             DIET DYS 3 Room service appropriate? Yes; Fluid consistency: Thin  Diet effective now                      Consultants: Infectious disease Palliative care  Procedures: None    Medications:    aspirin EC  81 mg Oral Daily   benzonatate  200 mg Oral TID   Chlorhexidine Gluconate Cloth  6 each Topical Daily   cholestyramine  4 g Oral BID   dronabinol  5 mg Oral BID AC   feeding supplement  237 mL Oral TID BM   furosemide  20 mg Oral Daily   insulin aspart  0-15 Units Subcutaneous Q4H   insulin aspart  4 Units Subcutaneous Q4H   insulin glargine-yfgn  20 Units Subcutaneous q morning   And   insulin glargine-yfgn  17 Units Subcutaneous QHS   loperamide  2 mg Oral TID   loratadine  10 mg Oral Daily   losartan  25 mg Oral Daily   mirtazapine  15 mg Oral QHS   pantoprazole  40 mg Oral BID   pregabalin  75 mg Oral BID   psyllium  1 packet Oral Daily   sodium chloride flush  10-40 mL Intracatheter Q12H   Continuous Infusions:  sodium chloride 250 mL (07/15/21 2104)  TPN ADULT (ION) 84 mL/hr at 07/24/21 0333   TPN ADULT (ION)       Anti-infectives (From admission, onward)    Start     Dose/Rate Route Frequency Ordered Stop   07/17/21 1400  amoxicillin-clavulanate (AUGMENTIN) 875-125 MG per tablet 1 tablet  Status:  Discontinued        1 tablet Oral Every 12 hours 07/17/21 0919 07/17/21 0927   07/17/21 1300  sulfamethoxazole-trimethoprim (BACTRIM DS) 800-160 MG per tablet 1 tablet        1 tablet Oral Every 12 hours 07/17/21 0928 07/23/21 2154   07/17/21 1300   metroNIDAZOLE (FLAGYL) tablet 500 mg        500 mg Oral Every 12 hours 07/17/21 0928 07/23/21 2149   07/16/21 0600  piperacillin-tazobactam (ZOSYN) IVPB 3.375 g  Status:  Discontinued        3.375 g 12.5 mL/hr over 240 Minutes Intravenous Every 8 hours 07/15/21 2119 07/17/21 0918   07/15/21 2100  ciprofloxacin (CIPRO) IVPB 400 mg        400 mg 200 mL/hr over 60 Minutes Intravenous Every 12 hours 07/15/21 1948 07/15/21 2359   07/13/21 1200  metroNIDAZOLE (FLAGYL) IVPB 500 mg        500 mg 100 mL/hr over 60 Minutes Intravenous Every 8 hours 07/13/21 0935 07/15/21 2359   07/08/21 1000  ciprofloxacin (CIPRO) IVPB 400 mg        400 mg 200 mL/hr over 60 Minutes Intravenous Every 12 hours 07/08/21 0849 07/14/21 2240   07/06/21 1315  cefTRIAXone (ROCEPHIN) 2 g in sodium chloride 0.9 % 100 mL IVPB  Status:  Discontinued        2 g 200 mL/hr over 30 Minutes Intravenous Every 24 hours 07/06/21 1225 07/08/21 0849              Family Communication/Anticipated D/C date and plan/Code Status   DVT prophylaxis: Place and maintain sequential compression device Start: 07/11/21 0909 SCDs Start: 06/26/21 2251     Code Status: Full Code  Family Communication: I called [name] (son) on [phone] at 5:50 pm but it went to voicemail. Disposition Plan:    Status is: Inpatient  Remains inpatient appropriate because:Unsafe d/c plan and Inpatient level of care appropriate due to severity of illness  Dispo: The patient is from: Home              Anticipated d/c is to: Home              Patient currently is medically stable to d/c.   Difficult to place patient No           Subjective:   Interval events noted.  He said he feels fine and has no complaints.  Objective:    Vitals:   07/23/21 1950 07/24/21 0350 07/24/21 0436 07/24/21 0741  BP: (!) 119/51 (!) 114/58  (!) 120/56  Pulse: 81 72  71  Resp: 16 16  14   Temp: 98.1 F (36.7 C) 97.7 F (36.5 C)  97.6 F (36.4 C)  TempSrc:  Oral Axillary  Oral  SpO2: 98% 100%  100%  Weight:   68.1 kg   Height:       No data found.   Intake/Output Summary (Last 24 hours) at 07/24/2021 1040 Last data filed at 07/24/2021 1016 Gross per 24 hour  Intake 1008.84 ml  Output 130 ml  Net 878.84 ml   Filed Weights   07/22/21 0455 07/23/21 0500 07/24/21 0436  Weight: 65.6  kg 67.6 kg 68.1 kg    Exam:  GEN: NAD SKIN: Erythematous changes on lower abdomen below pouch. Port-a-cath on left upper chest  EYES: EOMI ENT: MMM CV: RRR PULM: CTA B ABD: soft, ND, NT, +BS, +pouch with enterocutaneous fistula effluent CNS: AAO x 2 (person and place), slurred speech, spontaneous intermittent  jerky movement of all 4 extremities EXT: No edema or tenderness        Data Reviewed:   I have personally reviewed following labs and imaging studies:  Labs: Labs show the following:   Basic Metabolic Panel: Recent Labs  Lab 07/18/21 0445 07/21/21 0600 07/22/21 0440  NA 132* 133* 134*  K 4.0 4.2 4.0  CL 107 107 108  CO2 21* 22 23  GLUCOSE 182* 235* 141*  BUN 42* 47* 49*  CREATININE 0.72 0.72 0.60*  CALCIUM 8.3* 8.0* 8.2*  MG 2.0 2.0 2.0  PHOS 2.7 3.9 3.7   GFR Estimated Creatinine Clearance: 65.4 mL/min (A) (by C-G formula based on SCr of 0.6 mg/dL (L)). Liver Function Tests: Recent Labs  Lab 07/18/21 0445 07/22/21 0440  AST 18 20  ALT 29 24  ALKPHOS 107 82  BILITOT 0.4 0.5  PROT 5.7* 5.5*  ALBUMIN 2.6* 2.5*   No results for input(s): LIPASE, AMYLASE in the last 168 hours. No results for input(s): AMMONIA in the last 168 hours. Coagulation profile No results for input(s): INR, PROTIME in the last 168 hours.  CBC: Recent Labs  Lab 07/20/21 1553  WBC 16.3*  HGB 9.9*  HCT 30.5*  MCV 85.0  PLT 233   Cardiac Enzymes: No results for input(s): CKTOTAL, CKMB, CKMBINDEX, TROPONINI in the last 168 hours. BNP (last 3 results) No results for input(s): PROBNP in the last 8760 hours. CBG: Recent Labs  Lab  07/23/21 1603 07/23/21 1953 07/24/21 0108 07/24/21 0343 07/24/21 0757  GLUCAP 169* 290* 189* 137* 134*   D-Dimer: No results for input(s): DDIMER in the last 72 hours. Hgb A1c: No results for input(s): HGBA1C in the last 72 hours. Lipid Profile: Recent Labs    07/22/21 0440  TRIG 54   Thyroid function studies: No results for input(s): TSH, T4TOTAL, T3FREE, THYROIDAB in the last 72 hours.  Invalid input(s): FREET3 Anemia work up: No results for input(s): VITAMINB12, FOLATE, FERRITIN, TIBC, IRON, RETICCTPCT in the last 72 hours. Sepsis Labs: Recent Labs  Lab 07/20/21 1553  WBC 16.3*    Microbiology No results found for this or any previous visit (from the past 240 hour(s)).  Procedures and diagnostic studies:  No results found.             LOS: 28 days   [place] Copywriter, advertising on [email or web address]. If 7PM-7AM, please contact night-coverage at [email or web address]     07/24/2021, 10:40 AM

## 2021-07-24 NOTE — Progress Notes (Signed)
Mobility Specialist - Progress Note   07/24/21 1300  Mobility  Activity Refused mobility  Mobility performed by Mobility specialist    Pt sleeping on arrival, awakened by voice. Politely declined mobility, requesting to rest at this time. Will attempt session another date/time.    Kathee Delton Mobility Specialist 07/24/21, 2:00 PM

## 2021-07-24 NOTE — Progress Notes (Signed)
   07/24/21 2110  Clinical Encounter Type  Visited With Patient  Visit Type Initial;Spiritual support;Social support  Referral From Nurse  Consult/Referral To Chaplain   Chaplain received a page from [facility] staff stating the PT requested a visit from the chaplain. When the chaplain arrived, PT declined the visit and stated, "he never asked to speak to a chaplain."    Andee Poles, [place].

## 2021-07-24 NOTE — TOC Progression Note (Addendum)
Transition of Care [facility]) - Progression Note    Patient Details  Name: Miguel Flores MRN: [id number] Date of Birth: [birth year]  Transition of Care [facility] CM/SW Contact  Eileen Stanford, LCSW Phone Number: 07/24/2021, 8:31 AM  Clinical Narrative:   [facility] will start servicing pt tomorrow afternoon. The individual who will service pt is Manus Gunning. Family is requesting they start tomorrow afternoon. CSW will notify [facility] and [facility] that pt will dc tomorrow afternoon.  CSW called pt's son [name] to update him. He states he will be at the hospital tomorrow morning and is hopeful that the pt will be dc around lunch. Pt's son states pt will need EMS transport home. CSW will arranged tomorrow once dc is completed. MD updated.  Labs and TPN info faxed to Dietician at [facility] they will create the order based upon and then will fax it back to CSW to get MD signature. [facility] updated on dc tomorrow.  Expected Discharge Plan:  (TBD) Barriers to Discharge: Continued Medical Work up  Expected Discharge Plan and Services Expected Discharge Plan:  (TBD)     Post Acute Care Choice:  (TBD) Living arrangements for the past 2 months: Single Family Home                                       Social Determinants of Health (SDOH) Interventions    Readmission Risk Interventions No flowsheet data found.

## 2021-07-24 NOTE — Consult Note (Addendum)
PHARMACY - TOTAL PARENTERAL NUTRITION CONSULT NOTE   Indication: Fistula  Patient Measurements: Height: 5' 5.98" (167.6 cm) Weight: 68.1 kg (150 lb 2.1 oz) IBW/kg (Calculated) : 63.76 TPN AdjBW (KG): 74.4 Body mass index is 24.24 kg/m.  Assessment:  Patient is an 80 y/o M with medical history including diabetes, HTN, colon cancer with lung metastases, recent history of admission at OSH for cholecystitis s/p cholecystectomy c/b post-op sepsis. Patient underwent ExLap with findings of small bowel injury which was repaired. 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 He is now presenting to the hospital for ileostomy leakage. Imaging notable for multiple intra-abdominal abscess with contrast extravasation to midline of abdominal wound. General surgery is following.   Pharmacy consulted to continue home TPN while patient is admitted.  Glucose / Insulin: History of controlled diabetes (last Hgb A1c 6.9%). on Dexamethasone 2 mg po daily  BG 137-290 Novolog  SSI-moderate q4 h +  20 u QAM and 17 u HS insulin glargine + Novolog 4 units q4h scheduled Novolog total(SSI +scheduled) per 24 hrs: 45 u  Electrolytes: hyponatremia Na 134, others WNL (pt on lasix 20mg  po daily) Renal: SCr stable < 1 Hepatic: LFTs, TG wnl Intake / Output; MIVF: no MIVF  GI Imaging: 8/17 CT abdomen / pelvis: Large ventral wound containing small amount of contrast. Enterocutaneous fistula. Multiple intra-abdominal abscess(s). Mesenteric inflammation. Small bilateral pleural effusions with atelectasis or pneumonia at the bases. Metastatic lung nodules, slight increased size of right lower lobe metastatic nodule  GI Surgeries / Procedures:  History of recent cholecystectomy, ExLap at OSH in 05/2021  Central access: Port (see note above) TPN start date: 8/18   Outpatient TPN information: [facility] in [place], [place] Ph:  [phone]  Nutritional Goals: Goal TPN rate 84 mL/hr (provides 106.9 g of protein and 2264 kcals per day)   RD Assessment: Estimated Needs Total Energy Estimated Needs: 2000-2300kcal/day Total Protein Estimated Needs: 100-115g/day Total Fluid Estimated Needs: 2.0-2.3L/day  Current Nutrition:  TPN dysphagia 3 diet, Ensure TID  Plan:  Continue TPN at full rate- 84 mL/hr (total volume w/ overfill 2116 mL)  Nutritional components: Amino acids (Travasol 10%): 106.9 grams Lipids (20% SMOF): 70.6 grams Dextrose: 332.6 grams kCal: 2264 / 24h Electrolytes in TPN: Na 90 mEq/L (increase from 85 to 90 meq/L on 07/21/21), KCl 15 mEq/L, Ca 5 mEq/L, Mg 83mEq/L, and Phos 28mmol/L. Cl:Ac 1:1 -Patient on lasix 20 mg PO daily -Patient completed Bactrim BID on 9/13 Add standard MVI and trace elements to TPN Monitor TPN labs on Mon/Thurs.   Sherilyn Banker, PharmD Clinical Pharmacist 07/24/2021 7:14 AM

## 2021-07-25 LAB — URINE CULTURE: Culture: <10000 — AB

## 2021-07-25 LAB — COMPREHENSIVE METABOLIC PANEL
ALT: 24 U/L (ref 0–44)
AST: 28 U/L (ref 15–41)
Albumin: 2.5 g/dL — ABNORMAL LOW (ref 3.5–5.0)
Alkaline Phosphatase: 79 U/L (ref 38–126)
Anion gap: 7 (ref 5–15)
BUN: 40 mg/dL — ABNORMAL HIGH (ref 8–23)
CO2: 21 mmol/L — ABNORMAL LOW (ref 22–32)
Calcium: 8.1 mg/dL — ABNORMAL LOW (ref 8.9–10.3)
Chloride: 108 mmol/L (ref 98–111)
Creatinine, Ser: 0.63 mg/dL (ref 0.61–1.24)
GFR, Estimated: >60 mL/min (ref >60)
Glucose, Bld: 115 mg/dL — ABNORMAL HIGH (ref 70–99)
Potassium: 3.8 mmol/L (ref 3.5–5.1)
Sodium: 136 mmol/L (ref 135–145)
Total Bilirubin: 0.5 mg/dL (ref 0.3–1.2)
Total Protein: 5.6 g/dL — ABNORMAL LOW (ref 6.5–8.1)

## 2021-07-25 LAB — GLUCOSE, CAPILLARY
Glucose-Capillary: 67 mg/dL — ABNORMAL LOW (ref 70–99)
Glucose-Capillary: 104 mg/dL — ABNORMAL HIGH (ref 70–99)
Glucose-Capillary: 104 mg/dL — ABNORMAL HIGH (ref 70–99)
Glucose-Capillary: 202 mg/dL — ABNORMAL HIGH (ref 70–99)

## 2021-07-25 LAB — MAGNESIUM: Magnesium: 1.8 mg/dL (ref 1.7–2.4)

## 2021-07-25 LAB — PHOSPHORUS: Phosphorus: 4 mg/dL (ref 2.5–4.6)

## 2021-07-25 MED ORDER — INSULIN GLARGINE-YFGN 100 UNIT/ML ~~LOC~~ SOLN
15.0000 [IU] | Freq: Two times a day (BID) | SUBCUTANEOUS | Status: DC
  Filled 2021-07-25 (×2): qty 0.15

## 2021-07-25 MED ORDER — METFORMIN HCL 1000 MG PO TABS: ORAL_TABLET | ORAL | Status: AC

## 2021-07-25 MED ORDER — DEXTROSE 50 % IV SOLN
12.5000 g | INTRAVENOUS | Status: AC
  Administered 2021-07-25: 12.5 g via INTRAVENOUS

## 2021-07-25 MED ORDER — HEPARIN SOD (PORK) LOCK FLUSH 100 UNIT/ML IV SOLN
500.0000 [IU] | Freq: Once | INTRAVENOUS | Status: DC
  Filled 2021-07-25: qty 5

## 2021-07-25 MED ORDER — DEXTROSE 50 % IV SOLN
INTRAVENOUS | Status: AC
  Filled 2021-07-25: qty 50

## 2021-07-25 MED ORDER — TRAVASOL 10 % IV SOLN
INTRAVENOUS | Status: DC
  Filled 2021-07-25: qty 1068.48

## 2021-07-25 NOTE — Progress Notes (Addendum)
Inpatient Diabetes Program Recommendations  AACE/ADA: New Consensus Statement on Inpatient Glycemic Control (2015)  Target Ranges:  Prepandial:   less than 140 mg/dL      Peak postprandial:   less than 180 mg/dL (1-2 hours)      Critically ill patients:  140 - 180 mg/dL   Lab Results  Component Value Date   GLUCAP 104 (H) 07/25/2021   HGBA1C 6.4 (H) 06/26/2021    Review of Glycemic Control Results for Miguel Flores, Miguel Flores (MRN [id number]) as of 07/25/2021 11:32  Ref. Range 07/24/2021 07:57 07/24/2021 11:40 07/24/2021 16:54 07/24/2021 19:54 07/24/2021 21:55 07/24/2021 23:55 07/25/2021 04:03 07/25/2021 05:00 07/25/2021 08:10  Glucose-Capillary Latest Ref Range: 70 - 99 mg/dL 134 (H) 175 (H) 236 (H) 71 154 (H) 153 (H) 67 (L) 104 (H) 104 (H)    Current orders for Inpatient glycemic control:  Novolog 4 units Q4 hours Novolog 0-15 units Q4 hours Semglee 15 units bid  TPN without insulin  Inpatient Diabetes Program Recommendations:    Noted Semglee dose reduced.  Watch trends for now. PO intake still poor but may have lead to the 236 yesterday  Thanks,  Tama Headings RN, MSN, BC-ADM Inpatient Diabetes Coordinator Team Pager [phone] (8a-5p)

## 2021-07-25 NOTE — Progress Notes (Signed)
EMS here to pick up the patient. Port a cath [name]. Son, [name], notified by me that EMS was here.

## 2021-07-25 NOTE — Discharge Summary (Signed)
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  Diet recommendation:  Diet Order             DIET DYS 3           DIET DYS 3 Room service appropriate? Yes; Fluid consistency: Thin  Diet effective now                     Code Status: Full Code     Hospital Course:   Miguel Flores is a 80 y.o. 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  He presented to the hospital because of aching pouch leakage/complications from enterocutaneous fistula.  CT abdomen and pelvis showed multiple abdominal fluid collections concerning for intra-abdominal abscesses.   Assessment/plan   Intra-abdominal abscesses: Repeat CT abdomen and pelvis on 07/16/2021 showed interval decrease in size of previously visualized intra-abdominal fluid collections.  Per radiologist report, there was no fluid collection that was large enough to consider percutaneous drainage.   He was initially treated with  empiric IV antibiotics.  He was subsequently switched to oral Bactrim and Flagyl which he completed on 07/24/2021.   Chronic enterocutaneous fistula with pouch: Present on admission.  Family has been indicated by wound care nurse and the management of packed at home.  Acute metabolic encephalopathy: Improved  Acute E. coli UTI: Completed antibiotics  Type 2 diabetes mellitus: He had 1 episode of hypoglycemia (glucose 67) on the morning of 07/25/2021.  He had been receiving insulin glargine for diabetes because he was on TPN.  Miguel Flores (patient's son) prefers that patient continues with hypoglycemic agents (metformin, glimepiride and Trulicity) that he was using prior to admission.  He does not want to continue insulin at discharge.  He has been advised to monitor glucose levels closely and have primary care physician adjust hypoglycemic agents accordingly.  Dysphagia: He is on dysphagia 3 diet and he is also on TPN for nutrition.  Miguel [name] wanted to know whether it will be possible to add insulin to the TPN bag.  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 [name].  Reportedly, patient is not a candidate for further chemotherapy.  Chronic hyponatremia: Asymptomatic   Overall, his condition has improved and is deemed stable for discharge to home today.  Discharge plan was discussed with Miguel Flores (patient's son).  All home medications were discussed and his questions were answered.  Medical Consultants:   Infectious disease Palliative care   Discharge Exam:    Vitals:   07/24/21 2018 07/25/21 0422 07/25/21 0500 07/25/21 0810  BP: (!) 106/49 (!) 105/54  (!) 128/57  Pulse: 84 81  88  Resp: 15 20  20   Temp: 98.2 F (36.8 C) 97.8 F (36.6 C)  97.6 F (36.4 C)  TempSrc: Oral Oral  Oral  SpO2: 100% 98%  99%  Weight:   68.7 kg    Height:         GEN: NAD SKIN: Warm and dry.  Erythematous changes on the lower abdomen below the pouch.  Port-A-Cath on the left upper chest EYES: No pallor or icterus ENT: MMM CV: RRR PULM: CTA B ABD: soft, ND, NT, +BS, + pouch with dark green effluent CNS: AAO x 2 (person and place), non focal EXT: No edema or tenderness   The results of significant diagnostics from this hospitalization (including imaging, microbiology, ancillary and laboratory) are listed below for reference.     Procedures and Diagnostic Studies:   CT ABDOMEN PELVIS W CONTRAST  Result Date: 06/26/2021 CLINICAL DATA:  Ileostomy bag leakage EXAM: CT ABDOMEN AND PELVIS WITH CONTRAST TECHNIQUE: Multidetector CT imaging of the abdomen and pelvis was performed using the standard protocol following bolus administration of intravenous contrast. CONTRAST:  58mL OMNIPAQUE IOHEXOL 350 MG/ML SOLN COMPARISON:  CT 03/13/2021, 11/28/2020 FINDINGS: Lower chest: Lung bases demonstrate small bilateral pleural effusions. Atelectasis or minimal pneumonia at the bases. 16 x 14 mm right lower lobe pulmonary nodule, previously 14 x 13 mm. Irregular lingular pulmonary nodule measuring 13 x 10 mm, previously 16 x 12 mm. Normal cardiac size. Hepatobiliary: Status post cholecystectomy. No focal hepatic abnormality. No intra or extrahepatic biliary dilatation Pancreas: Unremarkable. No pancreatic ductal dilatation or surrounding inflammatory changes. Spleen: Normal in size without focal abnormality. Adrenals/Urinary Tract: Adrenal glands are normal. Kidneys show no hydronephrosis. The bladder is normal Stomach/Bowel: The stomach is nonenlarged. Status post distal left colectomy with reanastomosis. Collapsed appearing colon. Contrast opacifies nondilated small bowel. Interval postsurgical changes of the small bowel. Large ventral wound containing contrast which appears to communicate with thickened small bowel deep to the wound, series 2, image 61.  Vascular/Lymphatic: Moderate aortic atherosclerosis. No aneurysm. No suspicious nodes. Reproductive: Prostate calcifications without mass Other: No free air. Multiple mildly rim enhancing intra-abdominal fluid collections. 5 x 2.6 cm right mid abdominal collection, series 2, image 44. 2.4 x 1.8 cm fluid collection in the right mid lateral abdomen adjacent to small bowel, series 2, image 47 5.2 x 1.3 cm lenticular shaped collection within the left mid abdomen, coronal series 6, image 32. Multiple additional smaller rim enhancing collections within the abdomen concerning for abscess. Small rim enhancing perihepatic and perisplenic fluid collections. generalized soft tissue stranding within the mesentery which is new compared to prior. Musculoskeletal: No acute or significant osseous findings. IMPRESSION: 1. Large ventral wound containing small amount of contrast. Postsurgical changes of small bowel deep to the wound with enteral contrast from the small bowel extending into the large ventral wound suggesting enterocutaneous fistula. 2. Multiple intra-abdominal rim enhancing fluid collections as described above concerning for intra-abdominal abscess. Generalized soft tissue stranding throughout the mesentery, likely inflammatory. 3. Small bilateral pleural effusions with atelectasis or pneumonia at the bases. Metastatic lung nodules, slight increased size of right lower lobe metastatic nodule. Electronically Signed   By: Donavan Foil M.D.   On: 06/26/2021 19:03   DG Outside Films Chest  Result Date:  06/27/2021 This examination belongs to an outside facility and is stored here for comparison purposes only.  Contact the originating outside institution for any associated report or interpretation.  DG Outside Films Chest  Result Date: 06/27/2021 This examination belongs to an outside facility and is stored here for comparison purposes only.  Contact the originating outside institution for any associated report or  interpretation.  DG Outside Films Chest  Result Date: 06/27/2021 This examination belongs to an outside facility and is stored here for comparison purposes only.  Contact the originating outside institution for any associated report or interpretation.    Labs:   Basic Metabolic Panel: Recent Labs  Lab 07/21/21 0600 07/22/21 0440 07/25/21 0803  NA 133* 134* 136  K 4.2 4.0 3.8  CL 107 108 108  CO2 22 23 21*  GLUCOSE 235* 141* 115*  BUN 47* 49* 40*  CREATININE 0.72 0.60* 0.63  CALCIUM 8.0* 8.2* 8.1*  MG 2.0 2.0 1.8  PHOS 3.9 3.7 4.0   GFR Estimated Creatinine Clearance: 65.4 mL/min (by C-G formula based on SCr of 0.63 mg/dL). Liver Function Tests: Recent Labs  Lab 07/22/21 0440 07/25/21 0803  AST 20 28  ALT 24 24  ALKPHOS 82 79  BILITOT 0.5 0.5  PROT 5.5* 5.6*  ALBUMIN 2.5* 2.5*   No results for input(s): LIPASE, AMYLASE in the last 168 hours. No results for input(s): AMMONIA in the last 168 hours. Coagulation profile No results for input(s): INR, PROTIME in the last 168 hours.  CBC: Recent Labs  Lab 07/20/21 1553  WBC 16.3*  HGB 9.9*  HCT 30.5*  MCV 85.0  PLT 233   Cardiac Enzymes: No results for input(s): CKTOTAL, CKMB, CKMBINDEX, TROPONINI in the last 168 hours. BNP: Invalid input(s): POCBNP CBG: Recent Labs  Lab 07/24/21 2155 07/24/21 2355 07/25/21 0403 07/25/21 0500 07/25/21 0810  GLUCAP 154* 153* 67* 104* 104*   D-Dimer No results for input(s): DDIMER in the last 72 hours. Hgb A1c No results for input(s): HGBA1C in the last 72 hours. Lipid Profile No results for input(s): CHOL, HDL, LDLCALC, TRIG, CHOLHDL, LDLDIRECT in the last 72 hours. Thyroid function studies No results for input(s): TSH, T4TOTAL, T3FREE, THYROIDAB in the last 72 hours.  Invalid input(s): FREET3 Anemia work up No results for input(s): VITAMINB12, FOLATE, FERRITIN, TIBC, IRON, RETICCTPCT in the last 72 hours. Microbiology Recent Results (from the past 240  hour(s))  Urine Culture     Status: Abnormal   Collection Time: 07/23/21  6:24 PM   Specimen: Urine, Clean Catch  Result Value Ref Range Status   Specimen Description   Final    URINE, CLEAN CATCH Performed at [facility], [address], [place], [place] [address]    Special Requests   Final    NONE Performed at [facility], [address] [place], [place] [address]    Culture (A)  Final    <10,000 COLONIES/mL INSIGNIFICANT GROWTH Performed at [facility] [address] [place], [place] [address]    Report Status 07/25/2021 FINAL  Final     Discharge Instructions:   Discharge Instructions     Call MD for:   Complete by: As directed    Call MD or go to the emergency room for any worrisome symptoms including but not limited to shortness of breath, chest pain, dizziness, palpitations, abdominal pain   Call MD for:  difficulty breathing, headache or visual disturbances   Complete by: As directed  Call MD for:  persistant dizziness or light-headedness   Complete by: As directed    Call MD for:  persistant nausea and vomiting   Complete by: As directed    Call MD for:  redness, tenderness, or signs of infection (pain, swelling, redness, odor or green/yellow discharge around incision site)   Complete by: As directed    Call MD for:  severe uncontrolled pain   Complete by: As directed    Call MD for:  temperature >100.4   Complete by: As directed    DIET DYS 3   Complete by: As directed    Fluid consistency: Thin   Discharge instructions   Complete by: As directed    Continue TPN as prescribed. Monitor glucose levels closely and submit record of glucose levels to PCP to adjust diabetic medications accordingly.   Discharge wound care:   Complete by: As directed    Use a 2 3/4 inch ostomy pouch and skin barrier to pouch the abdominal EC fistula.  Wash the skin with water only. Crust with stoma powder and skin film barrier wipes. Use 3 barrier  rings around the wound--2 at the bottom in a U shape, one at the top.   Increase activity slowly   Complete by: As directed       Allergies as of 07/25/2021   No Known Allergies      Medication List     STOP taking these medications    clobetasol cream 0.05 % Commonly known as: TEMOVATE   hydrOXYzine 25 MG tablet Commonly known as: ATARAX/VISTARIL   lidocaine-prilocaine cream Commonly known as: EMLA       TAKE these medications    acetaminophen 500 MG tablet Commonly known as: TYLENOL Take 1,000 mg by mouth in the morning and at bedtime.   aspirin EC 81 MG tablet Take 81 mg by mouth daily.   COLACE PO Take 1 Dose by mouth as needed.   glimepiride 2 MG tablet Commonly known as: AMARYL Take 2 mg by mouth 2 (two) times daily before a meal.   loratadine 10 MG tablet Commonly known as: CLARITIN Take 10 mg by mouth daily.   losartan 25 MG tablet Commonly known as: COZAAR Take 25 mg by mouth daily.   metFORMIN 1000 MG tablet Commonly known as: GLUCOPHAGE TAKE 1 TABLET (1000 MG) BY MOUTH IN THE MORNING & TAKE 1.5 TABLETS (1500 MG) BY MOUTH AT SUPPER What changed:  how much to take how to take this when to take this   OneTouch Verio test strip Generic drug: glucose blood   pravastatin 20 MG tablet Commonly known as: PRAVACHOL Take 20 mg by mouth daily with supper.   pregabalin 75 MG capsule Commonly known as: LYRICA Take 1 capsule (75 mg total) by mouth 2 (two) times daily.   Trulicity 1.5 KT/6.2BW Sopn Generic drug: Dulaglutide Inject 1.5 mg into the skin once a week.               Discharge Care Instructions  (From admission, onward)           Start     Ordered   07/25/21 0000  Discharge wound care:       Comments: Use a 2 3/4 inch ostomy pouch and skin barrier to pouch the abdominal EC fistula.  Wash the skin with water only. Crust with stoma powder and skin film barrier wipes. Use 3 barrier rings around the wound--2 at the bottom  in a U shape, one  at the top.   07/25/21 1153            Follow-up Information     [place], [place] F, MD Follow up in 2 week(s).   Specialty: General Surgery Contact information: [address] [place] [address] [phone]                   If you experience worsening of your admission symptoms, develop shortness of breath, life threatening emergency, suicidal or homicidal thoughts you must seek medical attention immediately by calling 911 or calling your MD immediately  if symptoms less severe.   You must read complete instructions/literature along with all the possible adverse reactions/side effects for all the medicines you take and that have been prescribed to you. Take any new medicines after you have completely understood and accept all the possible adverse reactions/side effects.    Please note   You were cared for by a hospitalist during your hospital stay. If you have any questions about your discharge medications or the care you received while you were in the hospital after you are discharged, you can call the unit and asked to speak with the hospitalist on call if the hospitalist that took care of you is not available. Once you are discharged, your primary care physician will handle any further medical issues. Please note that NO REFILLS for any discharge medications will be authorized once you are discharged, as it is imperative that you return to your primary care physician (or establish a relationship with a primary care physician if you do not have one) for your aftercare needs so that they can reassess your need for medications and monitor your lab values.       Time coordinating discharge: 37 minutes  Signed:  Rosela Supak  [facility] 07/25/2021, 11:53 AM   Pager on [email or web address]. If 7PM-7AM, please contact night-coverage at [email or web address]

## 2021-07-25 NOTE — Consult Note (Signed)
PHARMACY - TOTAL PARENTERAL NUTRITION CONSULT NOTE   Indication: Fistula  Patient Measurements: Height: 5' 5.98" (167.6 cm) Weight: 68.7 kg (151 lb 7.3 oz) IBW/kg (Calculated) : 63.76 TPN AdjBW (KG): 74.4 Body mass index is 24.46 kg/m.  Assessment:  Patient is an 80 y/o M with medical history including diabetes, HTN, colon cancer with lung metastases, recent history of admission at OSH for cholecystitis s/p cholecystectomy c/b post-op sepsis. Patient underwent ExLap with findings of small bowel injury which was repaired. 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 He is now presenting to the hospital for ileostomy leakage. Imaging notable for multiple intra-abdominal abscess with contrast extravasation to midline of abdominal wound. General surgery is following.   Pharmacy consulted to continue home TPN while patient is admitted.  Glucose / Insulin: History of controlled diabetes (last Hgb A1c 6.9%) Novolog  SSI-moderate q4 h +  15 u BID insulin glargine + Novolog 4 units q4h scheduled Novolog total(SSI +scheduled) per 24 hrs: 29 u  Electrolytes: Na 134>136 after increasing in TPN on 9/11 Renal: SCr stable < 1 Hepatic: LFTs, TG wnl Intake / Output; MIVF: no MIVF  GI Imaging: 8/17 CT abdomen / pelvis: Large ventral wound containing small amount of contrast. Enterocutaneous fistula. Multiple intra-abdominal abscess(s). Mesenteric inflammation. Small bilateral pleural effusions with atelectasis or pneumonia at the bases. Metastatic lung nodules, slight increased size of right lower lobe metastatic nodule  GI Surgeries / Procedures:  History of recent cholecystectomy, ExLap at OSH in 05/2021  Central access: Port (see note above) TPN start date: 8/18   Outpatient TPN information: [facility] in [place], [place] Ph: [phone]  Nutritional Goals: Goal TPN rate 84 mL/hr (provides 106.9 g of protein and  2264 kcals per day)   RD Assessment: Estimated Needs Total Energy Estimated Needs: 2000-2300kcal/day Total Protein Estimated Needs: 100-115g/day Total Fluid Estimated Needs: 2.0-2.3L/day  Current Nutrition:  TPN dysphagia 3 diet, Ensure TID  Plan:  Continue TPN at full rate- 84 mL/hr (total volume w/ overfill 2116 mL)  Nutritional components: Amino acids (Travasol 10%): 106.9 grams Lipids (20% SMOF): 70.6 grams Dextrose: 332.6 grams kCal: 2264 / 24h Electrolytes in TPN: Na 90 mEq/L (increase from 85 to 90 meq/L on 07/21/21), KCl 15 mEq/L, Ca 5 mEq/L, Mg 71mEq/L, and Phos 43mmol/L. Cl:Ac 1:1 Add standard MVI and trace elements to TPN Monitor TPN labs on Mon/Thurs.   Sherilyn Banker, PharmD Clinical Pharmacist 07/25/2021 7:20 AM

## 2021-07-25 NOTE — Progress Notes (Signed)
Patient's blood sugar was 67 so I implemented hypoglycemia order set and gave 25 mL of D50.  Will recheck in 30 minutes.  Christene Slates

## 2021-07-25 NOTE — Progress Notes (Signed)
Rechecked blood sugar and it increased to 104.  Will continue to monitor.  Christene Slates

## 2021-07-25 NOTE — Care Management Important Message (Signed)
Important Message  Patient Details  Name: Miguel Flores MRN: [id number] Date of Birth: Jul 29, 1941   Medicare Important Message Given:  No  Patient discharged prior to arrival to unit to deliver concurrent Medicare IM.  Copies left and reviewed with patient and family previously during stay.    Dannette Barbara 07/25/2021, 1:35 PM

## 2021-07-30 ENCOUNTER — Telehealth: Payer: Self-pay | Admitting: Nurse Practitioner

## 2021-07-30 ENCOUNTER — Other Ambulatory Visit: Payer: Self-pay | Admitting: Hospice and Palliative Medicine

## 2021-07-30 DIAGNOSIS — C7800 Secondary malignant neoplasm of unspecified lung: Secondary | ICD-10-CM

## 2021-07-30 NOTE — Telephone Encounter (Signed)
Attempted to contact patient to offer to schedule a Palliative Consult, no answer - unable to leave a message due to mailbox was full.  I then called patient's son Miguel Flores, and spoke with him about the the Palliative referral/services and all questions were answered and he was in agreement with scheduling visit.  I have scheduled an In-home Consult for 08/13/21 @ 9 AM.

## 2021-07-30 NOTE — Progress Notes (Signed)
Referral to community palliative care

## 2021-08-02 ENCOUNTER — Encounter: Payer: Self-pay | Admitting: Oncology

## 2021-08-06 ENCOUNTER — Other Ambulatory Visit: Payer: Self-pay | Admitting: *Deleted

## 2021-08-06 DIAGNOSIS — R110 Nausea: Secondary | ICD-10-CM

## 2021-08-06 MED ORDER — ONDANSETRON HCL 8 MG PO TABS: 8.0000 mg | ORAL_TABLET | Freq: Three times a day (TID) | ORAL | 0 refills | Status: DC | PRN

## 2021-08-08 ENCOUNTER — Telehealth: Payer: Self-pay | Admitting: *Deleted

## 2021-08-08 NOTE — Telephone Encounter (Signed)
Called and spoke to Jenny Reichmann- son. Asked how the patient is doing and he said the nausea has subsided. He is doing ok. They ae giving him nausea med once a day. He states that pt having confusion at night and sometimes in the day. They wanted to know if he could have some ativan to relax him. He is having all BM from colostomy bag. And he is getting up to urinate. He has seen decrease in output. Tom. Is the last day of TPN. His Dad said he wants to stop it and get rid of some lines so he can get up better and walk. Jenny Reichmann says that he thought dr [name] was going palliative care. They are continuing help in the house and he feels there will be a time to go to hospice care but taking it one day at a time.I told him that I will check with [name] and let him know.

## 2021-08-12 ENCOUNTER — Telehealth: Payer: Self-pay | Admitting: *Deleted

## 2021-08-12 NOTE — Telephone Encounter (Signed)
Called both sons. I had called last week checking on pt. Miguel Flores has mentioned that sometimes he gets confused and wanted to see if the pt. Could have ativan. Miguel Flores said that the ativan can cause more confusion and wanted to see if they were ok to do video visit with Palliative care Miguel Flores. If they can call me and I can set it up and all number left for them to call

## 2021-08-13 ENCOUNTER — Encounter: Payer: Self-pay | Admitting: Nurse Practitioner

## 2021-08-13 ENCOUNTER — Other Ambulatory Visit: Payer: Self-pay

## 2021-08-13 ENCOUNTER — Other Ambulatory Visit: Payer: Medicare Other | Admitting: Nurse Practitioner

## 2021-08-13 DIAGNOSIS — R630 Anorexia: Secondary | ICD-10-CM

## 2021-08-13 DIAGNOSIS — R531 Weakness: Secondary | ICD-10-CM

## 2021-08-13 NOTE — Progress Notes (Addendum)
[facility] Palliative Care Consult Note Telephone: [phone]  Fax: [phone]   Date of encounter: 08/13/21 3:12 PM PATIENT NAME: Miguel Flores [place] [id number]   [id number] (home)  DOB: 12-26-1940 MRN: [id number] PRIMARY CARE PROVIDER:    [name], MD,  [place] [address] [phone] RESPONSIBLE PARTY:    Contact Information     Name Relation Home Work Mobile   Mellor,Ronald [place]) Son [phone]     Bon, Dowis) Son   [phone]      I met face to face with patient in home. Palliative Care was asked to follow this patient by consultation request of  [name], MD to address advance care planning and complex medical decision making. This is the initial visit.                                ASSESSMENT AND PLAN / RECOMMENDATIONS:   Advance Care Planning/Goals of Care: Goals include to maximize quality of life and symptom management. Patient/health care surrogate gave his/her permission to discuss.Our advance care planning conversation included a discussion about:    The value and importance of advance care planning  Experiences with loved ones who have been seriously ill or have died  Exploration of personal, cultural or spiritual beliefs that might influence medical decisions  Exploration of goals of care in the event of a sudden injury or illness  Identification and preparation of a healthcare agent  Review and updating or creation of an  advance directive document . Decision not to resuscitate or to de-escalate disease focused treatments due to poor prognosis. CODE STATUS: Full code  Symptom Management/Plan: 1. Advance Care Planning; Full code  2. 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 Continue to encourage to eat, discussed nutrition. We talked about supplements. We talked about option of appetite stimulant. I talked to Miguel Flores, [name] about remeron. Miguel Flores and Miguel Flores in agreement to trying. Miguel Flores requested ativan for anxiety episodes or intermit confusion. We talked about Miguel Flores use of ativan in the past, concern for fall risk with multiple hospitalizations, medications, surgeries, chronic disease progression. We talked about trying remeron first for which has multiple benefits.   Rx: Remeron 26m qhs; #30; 1RF  3. 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 Continue physical therapy, encouraged to continue mobility, increase strength, walk with walker.   4. Goals of Care: Goals include to maximize quality of life and symptom management. Our advance care planning conversation included a discussion about:    The value and importance of advance care planning  Exploration of personal, cultural or spiritual beliefs that might influence medical decisions  Exploration of goals of care in the event of a sudden injury or illness  Identification and preparation of a healthcare agent  Review and updating or creation of an advance directive document.  5. Palliative care encounter; Palliative care encounter; Palliative medicine team will continue to support patient, patient's family, and medical team. Visit consisted of counseling and education dealing with the complex and emotionally intense issues of symptom management and palliative care in the setting of serious and potentially life-threatening illness  6. f/u 2 weeks for ongoing monitoring chronic disease progression, ongoing  discussions complex medical decision making  Follow up Palliative Care Visit: Palliative care will continue to follow  for complex medical decision making, advance care planning, and clarification of goals. Return 2 weeks or prn.  I spent 81 minutes providing this consultation. More than 50% of the time in this consultation was spent in counseling and care coordination. PPS: 40%  Chief Complaint: Initial palliative consult for complex medical decision making  HISTORY OF PRESENT ILLNESS:  Miguel Flores is a 80 y.o. 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  Last hospitalization 06/26/2021 to [id number] for aching pouch leakage/complications from enterocutaneous fistula.  CT abdomen and pelvis showed multiple abdominal fluid collections concerning for intra-abdominal abscesses, acute E.coli uti; dysphagia 3 diet was on TPN which has been d/c upon d/c home. Completed antibiotics. Blood sugars running upper 200's, continues with hypoglycemic agents (metformin, glimepiride and Trulicity) that he was using prior to admission.  He does not want to continue insulin at discharge. Chronic hyponatremia asymptomatic. Miguel Flores was d/c home. I called Miguel Flores to confirm pc initial consult which was okay and covid screening negative. I visited Miguel Flores in his home, he was sitting in his chair in the living room with caregiver Miguel Flores present. Miguel Flores endorses Miguel Flores and he have been walking this am with his walker. No recent falls. We talked about prior to initial dx with hospitalization. We talked about past medical history with chronic disease progression. We talked about symptoms of pain, weakness, fatigue. We talked about sleep pattern, sleep hygiene. We talked about bowel regimen. We talked about appetite declined, nutrition, Mr. Neuser daily routine. We talked about physical therapy. We talked about goals of care. We talked about  code status as Miguel Flores wishes to remain a full code. We talked about live in caregiver, how that program works. Miguel Flores, Miguel Flores son called on the phone. We talked about pc visit, pmh, chronic disease, symptoms, plan with remeron. We reviewed goc. We talked about option of Hospice, though at this time he continues to wish to work on therapy. We talked about what Hospice would provide. We talked about Miguel Flores in agreement. We talked about role pc in poc. We talked about f/u visit, scheduled. Therapeutic listening, emotional support provided. Questions answered.   History obtained from review of EMR, discussion with Miguel Flores, son by phone with in person with, caregiver Miguel Flores and Miguel Flores.  I reviewed available labs, medications, imaging, studies and related documents from the EMR.  Records reviewed and summarized above.   ROS Full 14 system review of systems performed and negative with exception of: as per HPI.   Physical Exam: Constitutional: NAD General: frail appearing, thin, decompensated male EYES: lids intact ENMT: oral mucous membranes moist CV: S1S2, RRR, no LE edema Pulmonary: decrease bases, no increased work of breathing, no cough, room air Abdomen: few bs, soft and non tender MSK: ambulatory with walker Skin: warm and dry; muscle wasting Neuro:  + generalized weakness Psych: non-anxious affect, A and O x 3  CURRENT PROBLEM LIST:  Patient Active Problem List   Diagnosis Date Noted   Acute metabolic encephalopathy    Palliative care encounter    Cough    Pleural effusion    Enterocutaneous fistula    Anemia of chronic disease    Hyponatremia    Overweight (BMI 25.0-29.9) 06/28/2021   Malnutrition of moderate degree 06/27/2021  Post cholecystectomy intraabdominal abscess 06/26/2021   S/P cholecystectomy 06/26/2021   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   On total parenteral nutrition (TPN) 06/26/2021   Goals of care, counseling/discussion 12/20/2018   Lung metastases  ([place]) 06/18/2018   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[place]) 10/20/2017   Pure hypercholesterolemia 08/14/2017   Vaccine counseling 03/09/2017   Essential hypertension 02/20/2017   Mixed hyperlipidemia 02/20/2017   Type 2 diabetes mellitus with hyperlipidemia ([place]) 02/20/2017   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[place]) 02/20/2017   Colonic mass    Large bowel obstruction ([place]) 02/05/2017   Senile purpura ([place]) 10/07/2016   Chronic eczema 02/04/2016   Normocytic anemia 02/04/2016   PAST MEDICAL HISTORY:  Active Ambulatory Problems    Diagnosis Date Noted   Large bowel obstruction ([place]) 02/05/2017   Colonic mass    Chronic eczema 02/04/2016   Essential hypertension 02/20/2017   Mixed hyperlipidemia 02/20/2017   Normocytic anemia 02/04/2016   Senile purpura ([place]) 10/07/2016   Type 2 diabetes mellitus with hyperlipidemia ([place]) 02/20/2017   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[place]) 02/20/2017   Vaccine counseling 03/09/2017   Pure hypercholesterolemia 08/14/2017   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[place]) 10/20/2017   Lung metastases ([place]) 06/18/2018   Goals of care, counseling/discussion 12/20/2018   Post cholecystectomy intraabdominal abscess 06/26/2021   S/P cholecystectomy 06/26/2021   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   On total parenteral nutrition (TPN) 06/26/2021   Malnutrition of moderate degree 06/27/2021   Overweight (BMI 25.0-29.9) 06/28/2021   Enterocutaneous fistula    Anemia of chronic disease    Hyponatremia    Cough    Pleural effusion    Acute metabolic encephalopathy    Palliative care encounter    Resolved Ambulatory Problems    Diagnosis Date Noted   No Resolved Ambulatory Problems   Past Medical History:  Diagnosis Date   Cancer ([place])    Cataract    Chronic kidney disease    Colon cancer ([place]) 2018   Diabetes mellitus without complication ([place])    Eczema    History of kidney stones    Hyperlipidemia    Hypertension    neuropathy from chemo    Vertigo    SOCIAL HX:  Social History    Tobacco Use   Smoking status: Never   Smokeless tobacco: Never  Substance Use Topics   Alcohol use: No   FAMILY HX:  Family History  Problem Relation Age of Onset   Diabetes Mother    AAA (abdominal aortic aneurysm) Mother    Coronary artery disease Mother    Diabetes Father    Coronary artery disease Father    Dementia Father    Breast cancer Sister     reivewed  ALLERGIES: No Known Allergies   PERTINENT MEDICATIONS:  Outpatient Encounter Medications as of 08/13/2021  Medication Sig   acetaminophen (TYLENOL) 500 MG tablet Take 1,000 mg by mouth in the morning and at bedtime.   aspirin EC 81 MG tablet Take 81 mg by mouth daily.   Docusate Sodium (COLACE PO) Take 1 Dose by mouth as needed.   glimepiride (AMARYL) 2 MG tablet Take 2 mg by mouth 2 (two) times daily before a meal.    loratadine (CLARITIN) 10 MG tablet Take 10 mg by mouth daily.   losartan (COZAAR) 25 MG tablet Take 25 mg by mouth daily.   metFORMIN (GLUCOPHAGE) 1000 MG tablet TAKE 1 TABLET (1000 MG) BY MOUTH IN THE MORNING & TAKE 1.5 TABLETS (1500 MG) BY MOUTH AT SUPPER   ondansetron (ZOFRAN)  8 MG tablet Take 1 tablet (8 mg total) by mouth every 8 (eight) hours as needed for nausea or vomiting.   ONETOUCH VERIO test strip    pravastatin (PRAVACHOL) 20 MG tablet Take 20 mg by mouth daily with supper.    pregabalin (LYRICA) 75 MG capsule Take 1 capsule (75 mg total) by mouth 2 (two) times daily.   TRULICITY 1.5 JJ/9.4RD SOPN Inject 1.5 mg into the skin once a week.   [DISCONTINUED] prochlorperazine (COMPAZINE) 10 MG tablet Take 1 tablet (10 mg total) by mouth every 6 (six) hours as needed (Nausea or vomiting). (Patient not taking: No sig reported)   Facility-Administered Encounter Medications as of 08/13/2021  Medication   heparin lock flush 100 unit/mL   sodium chloride flush (NS) 0.9 % injection 10 mL   sodium chloride flush (NS) 0.9 % injection 10 mL  Questions and concerns were addressed. The patient/family  was encouraged to call with questions and/or concerns. My business card was provided. Provided general support and encouragement, no other unmet needs identified   Thank you for the opportunity to participate in the care of Miguel Flores.  The palliative care team will continue to follow. Please call our office at [phone] if we can be of additional assistance.   This chart was dictated using voice recognition software.  Despite best efforts to proofread,  errors can occur which can change the documentation meaning.   Kemon Devincenzi Z [place] Man[place], NP ,   COVID-19 PATIENT SCREENING TOOL Asked and negative response unless otherwise noted:  Have you had symptoms of covid, tested positive or been in contact with someone with symptoms/positive test in the past 5-10 days? NO

## 2021-08-16 ENCOUNTER — Encounter: Payer: Self-pay | Admitting: Oncology

## 2021-08-17 ENCOUNTER — Other Ambulatory Visit: Payer: Self-pay | Admitting: Oncology

## 2021-08-17 DIAGNOSIS — R110 Nausea: Secondary | ICD-10-CM

## 2021-08-18 ENCOUNTER — Encounter: Payer: Self-pay | Admitting: Oncology

## 2021-08-19 ENCOUNTER — Telehealth: Payer: Self-pay | Admitting: Nurse Practitioner

## 2021-08-19 NOTE — Telephone Encounter (Signed)
I received a call from Dr [name] and Billey Chang NP concerning decline of Mr. Rochin. I attempted to call Elie Confer, Mr. Corigliano son for further discussion of decline, no answer, message left with contact information

## 2021-08-19 NOTE — Telephone Encounter (Signed)
I called Elie Confer, Mr. Deery son after numerous attempts today. [name] endorses his phone has not been working properly and did not record messages as well as he has been on the phone all day. We talked about Home health nurse report of no bowel sounds, no output colostomy, little urine output with poor appetite, lethargic. We talked about medical goals of care including aggressive vs comfort care. [name] endorses wishes are for comfort care at home. [name] endorses 2 days ago Mr. Melander walked with therapy outside. Yesterday became very weak. We talked about from description with presentation that Mr. Wyszynski is transitioning, rapidly declining. We talked about 24 hr caregivers. We talked about concern for loosing them should Hospice start services. Discussed that can have Prince Rome, billing from Hospice to contact him to talk about insurance and caregivers. We talked about it they both can co-exist will proceed with hospice. Discussed with [name] if they both can not exist would have to pay private pay and/or family would need to stay. [name] endorses to proceed with Hospice services to schedule AV visits as it appears will be for short period of time requiring care. Andy in agreement. Notified Hospice.   Total time spent 20 minutes Phone discussion 15 minutes Documentation 5 mintues

## 2021-08-20 ENCOUNTER — Other Ambulatory Visit: Payer: Self-pay | Admitting: Hospice and Palliative Medicine

## 2021-08-20 MED ORDER — MORPHINE SULFATE (CONCENTRATE) 10 MG /0.5 ML PO SOLN: 5.0000 mg | ORAL | 0 refills | Status: AC | PRN

## 2021-08-22 ENCOUNTER — Telehealth: Payer: Self-pay | Admitting: Oncology

## 2021-08-22 NOTE — Telephone Encounter (Signed)
Dr. [name] received notification that she had a death certificate for the patient to certify, however the time of death was not entered.  [place] and the clerk noted time of death at 73 am.  Updated the certificate in [facility] and notified Dr. [name].

## 2021-09-10 NOTE — Progress Notes (Signed)
Spoke with Natalia Leatherwood, NP. Hospice referral made. Sent Rx for morphine elixir given patient's rapid decline. Reportedly, he is actively in terminal stage.

## 2021-09-10 DEATH — deceased

## 2021-09-16 IMAGING — CT CT CHEST-ABD-PELV W/ CM
2 of 5 series · 12 of 36 positions shown, 14 images · IV contrast (omnipaque)
Comparison: Multiple priors including CT chest abdomen pelvis
July 20, 2020

CLINICAL DATA: Restaging colon cancer diagnosed in 1410 history of
partial colonic resection and chemotherapy. Known Mets to the lungs
in 8809 still receiving chemotherapy every 3 months.

EXAM:
CT CHEST, ABDOMEN, AND PELVIS WITH CONTRAST
TECHNIQUE: Multidetector CT imaging of the chest, abdomen and pelvis was
performed following the standard protocol during bolus
administration of intravenous contrast.
CONTRAST:  85mL OMNIPAQUE IOHEXOL 300 MG/ML  SOLN

[Series 2: axials cap 5.00 · axial · 0.75mm/px · z∈[-1458,-938]mm · 9 of 132 slices shown, 11 images]
[im 14/132  mediastinal]
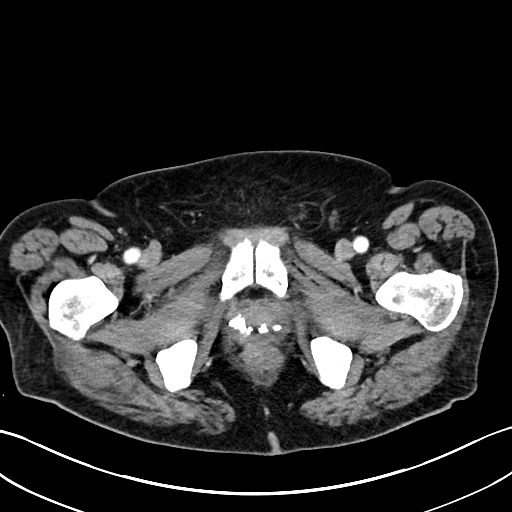
[im 14/132  bone]
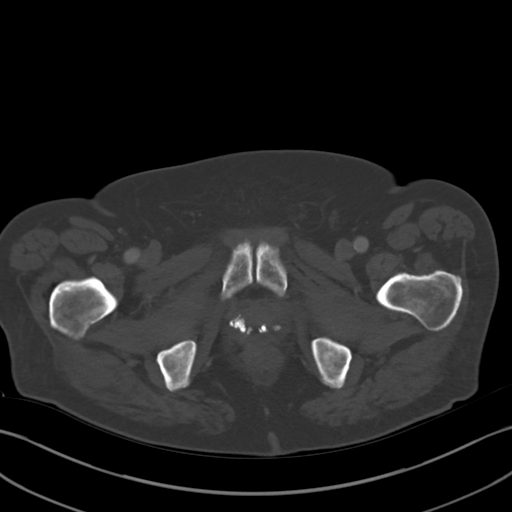
[im 27/132  mediastinal]
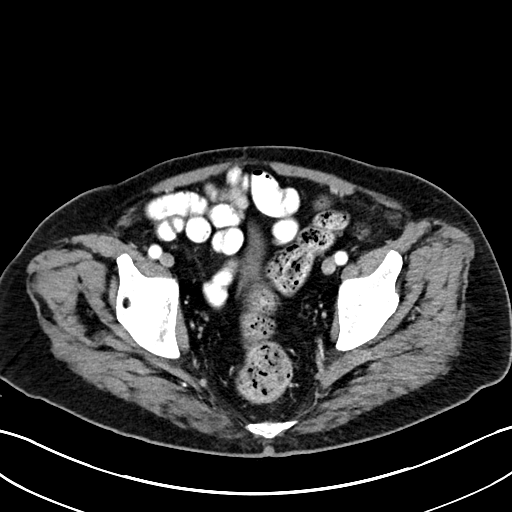
[im 40/132  mediastinal]
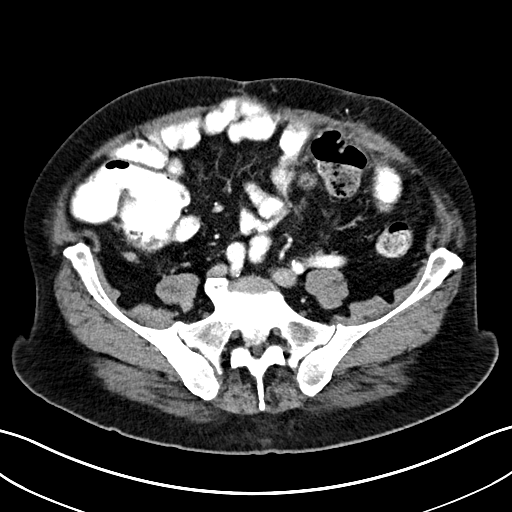
[im 53/132  mediastinal]
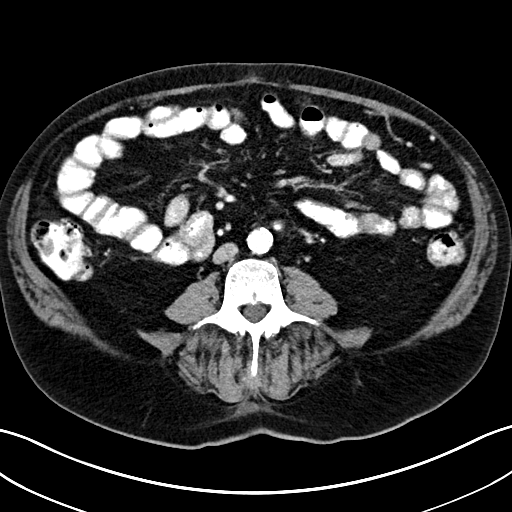
[im 66/132  mediastinal]
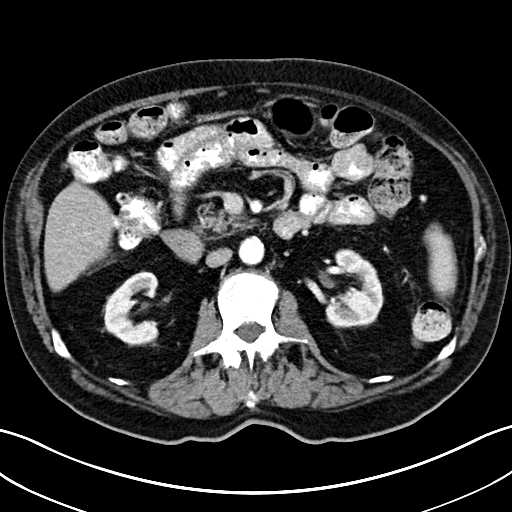
[im 79/132  mediastinal]
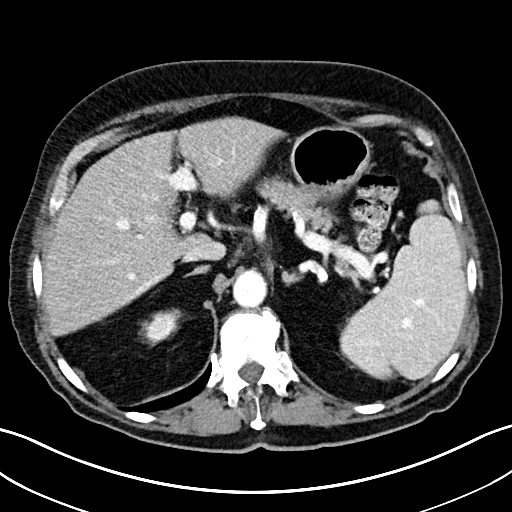
[im 92/132  mediastinal]
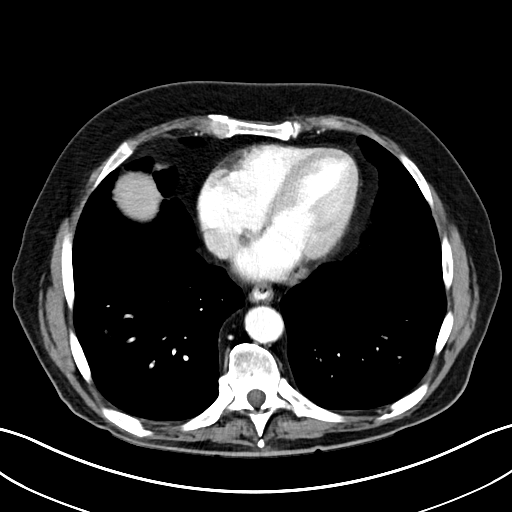
[im 105/132  mediastinal]
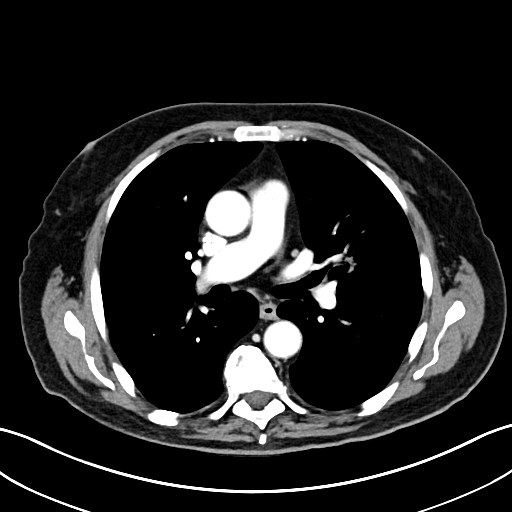
[im 118/132  mediastinal]
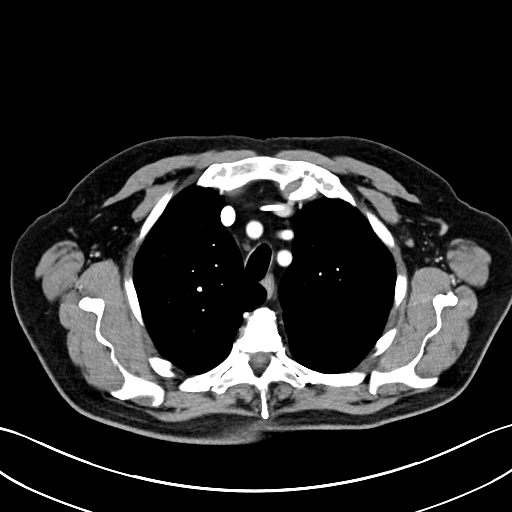
[im 118/132  bone]
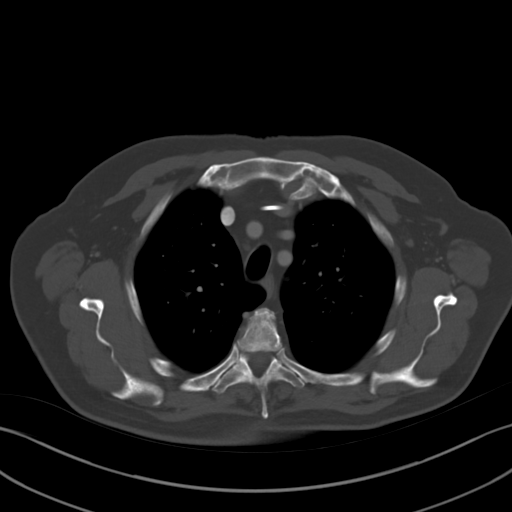

[Series 4: coronals cap 2.00 cor · coronal · 0.75mm/px · 3 of 152 slices shown]
[im 31/152  mediastinal]
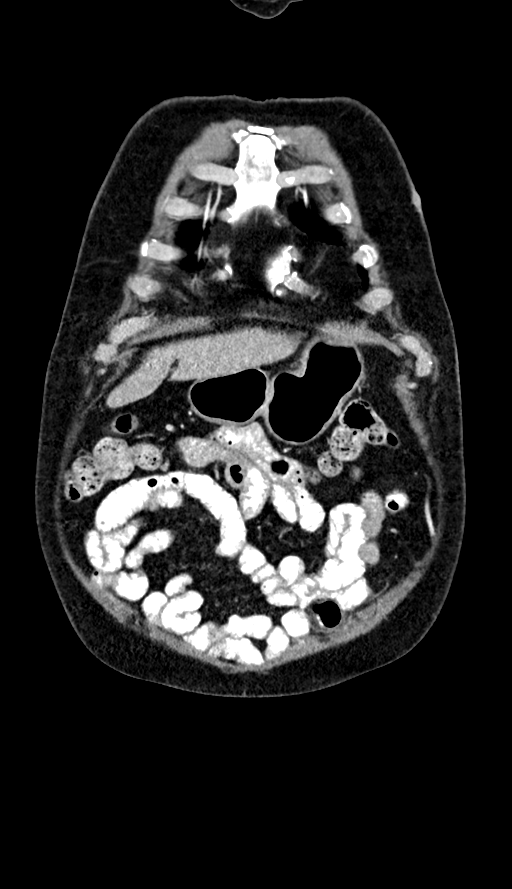
[im 61/152  mediastinal]
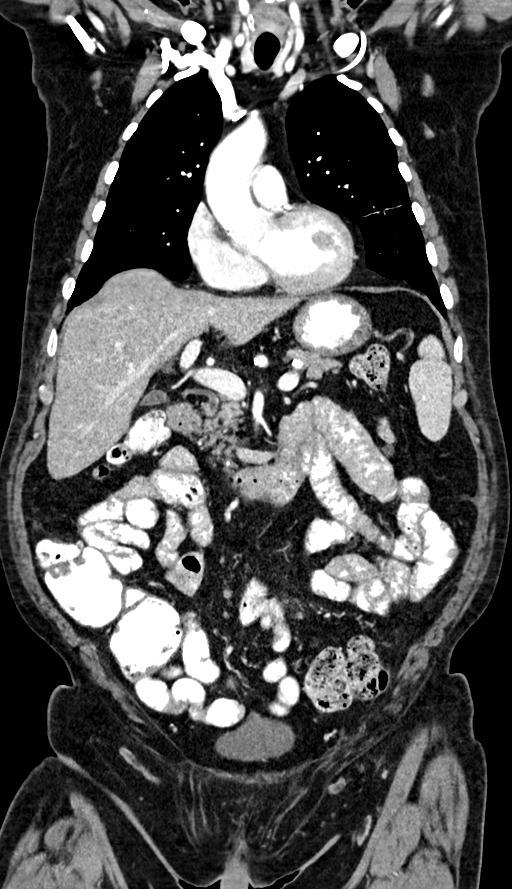
[im 91/152  mediastinal]
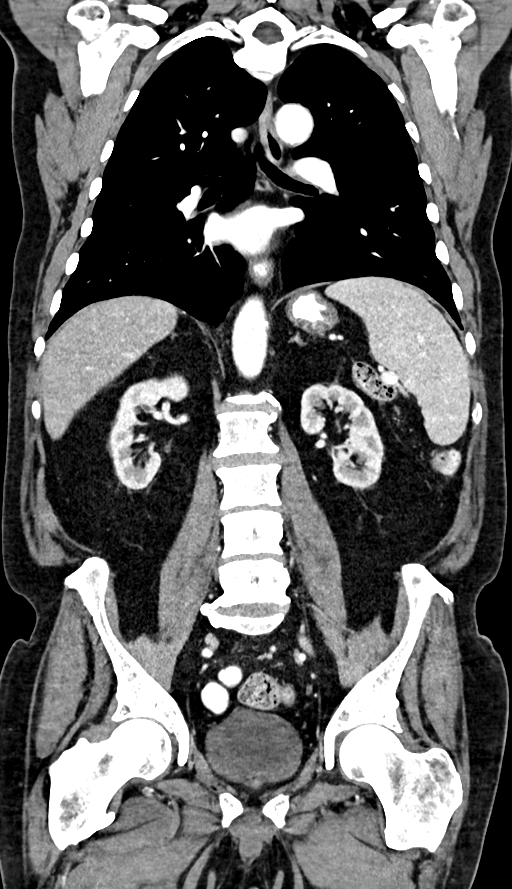

[12 of 36 positions shown; findings below may reference images not displayed]

FINDINGS: CT CHEST FINDINGS

Cardiovascular: Left chest Port-A-Cath with tip in the right atrium.
Aortic and great vessel atherosclerosis. Coronary artery
calcifications. Normal size heart. No pericardial effusion.

Mediastinum/Nodes: No enlarged mediastinal, hilar, or axillary lymph
nodes. Thyroid gland, trachea, and esophagus demonstrate no
significant findings.

Lungs/Pleura: No pleural effusion or pneumothorax. Bilateral
pulmonary nodules are again visualized. For reference:

-Spiculated right lower lobe nodule measures 12 x 10 mm previously
10 x 10 mm (series 3, image 100).

-Lobulated left upper lobe perihilar nodule measures 29 x 22 mm
previously 18 x 17 mm (series 3, image 71).

-The 2 other left upper lobe nodules measuring 10 mm (series 3,
image 59) and 13 x 8 mm (series 3, image 82) are unchanged in size.
No new nodules are identified.

Musculoskeletal: No chest wall mass or suspicious bone lesions
identified.

CT ABDOMEN PELVIS FINDINGS

Hepatobiliary: The liver is slightly hypoenhancing and comparisons
filling suggestive of mild diffuse hepatic steatosis. Stable
low-density lesion in the dome of the liver which measures 5 mm
(series 2, image 46). Cholelithiasis without gallbladder dilation or
wall thickening. No biliary ductal dilatation.

Pancreas: Unremarkable. No pancreatic ductal dilatation or
surrounding inflammatory changes.

Spleen: Normal in size without focal abnormality.

Adrenals/Urinary Tract: Adrenal glands are unremarkable. Kidneys are
normal, without renal calculi, focal lesion, or hydronephrosis.
Bladder is unremarkable.

Stomach/Bowel: Stomach is within normal limits. Appendix appears
normal. No evidence of bowel wall thickening, distention, or
inflammatory changes. Similar postsurgical changes of sigmoidectomy
and anastomosis. Moderate volume of formed stool in the colon.

Vascular/Lymphatic: Aortic atherosclerosis. No enlarged abdominal or
pelvic lymph nodes.

Reproductive: Similar dystrophic prostatic calcifications.

Other: Postsurgical change in the anterior abdominal wall, similar
prior. Stable prominent fat in bilateral inguinal canals. No
abdominopelvic ascites or peritoneal nodularity.

Musculoskeletal: No acute or significant osseous findings.
IMPRESSION: 1. Continued interval growth of 2 pulmonary nodules, consistent with
metastatic disease. The additional 2 left upper lobe pulmonary
nodules are unchanged in size. No new nodules are identified.
2. No evidence of metastatic disease within the abdomen or pelvis.
3. Similar postsurgical changes of sigmoidectomy and anastomosis.
4. Cholelithiasis without evidence of acute cholecystitis.
5. Aortic atherosclerosis.

Aortic Atherosclerosis ([id number]).

## 2022-08-28 NOTE — Telephone Encounter (Signed)
Signing encounter, see note 05/02/20
